# Patient Record
Sex: Female | Born: 1980 | Race: White | Hispanic: No | Marital: Single | Smoking: Former smoker
Health system: Southern US, Community
[De-identification: ages and names within clinical notes are randomized; demographics above are authoritative.]

## PROBLEM LIST (undated history)

## (undated) DIAGNOSIS — E119 Type 2 diabetes mellitus without complications: Secondary | ICD-10-CM

## (undated) DIAGNOSIS — F419 Anxiety disorder, unspecified: Secondary | ICD-10-CM

## (undated) HISTORY — PX: BACK SURGERY: SHX140

## (undated) HISTORY — PX: TOOTH EXTRACTION: SUR596

## (undated) HISTORY — PX: LAPAROSCOPY: SHX197

---
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
## 1999-05-19 ENCOUNTER — Other Ambulatory Visit: Admission: RE | Admit: 1999-05-19 | Discharge: 1999-05-19 | Payer: Self-pay | Admitting: Obstetrics and Gynecology

## 2000-09-06 ENCOUNTER — Other Ambulatory Visit: Admission: RE | Admit: 2000-09-06 | Discharge: 2000-09-06 | Payer: Self-pay | Admitting: Obstetrics and Gynecology

## 2001-09-04 ENCOUNTER — Other Ambulatory Visit: Admission: RE | Admit: 2001-09-04 | Discharge: 2001-09-04 | Payer: Self-pay | Admitting: Obstetrics and Gynecology

## 2002-01-30 ENCOUNTER — Encounter: Admission: RE | Admit: 2002-01-30 | Discharge: 2002-01-30 | Payer: Self-pay | Admitting: Family Medicine

## 2002-01-30 ENCOUNTER — Encounter: Payer: Self-pay | Admitting: Family Medicine

## 2002-02-13 ENCOUNTER — Other Ambulatory Visit: Admission: RE | Admit: 2002-02-13 | Discharge: 2002-02-13 | Payer: Self-pay | Admitting: Obstetrics and Gynecology

## 2002-03-22 ENCOUNTER — Ambulatory Visit (HOSPITAL_COMMUNITY): Admission: RE | Admit: 2002-03-22 | Discharge: 2002-03-22 | Payer: Self-pay | Admitting: Obstetrics and Gynecology

## 2004-01-15 ENCOUNTER — Ambulatory Visit (HOSPITAL_COMMUNITY): Admission: RE | Admit: 2004-01-15 | Discharge: 2004-01-15 | Payer: Self-pay | Admitting: Obstetrics and Gynecology

## 2004-01-15 ENCOUNTER — Other Ambulatory Visit: Admission: RE | Admit: 2004-01-15 | Discharge: 2004-01-15 | Payer: Self-pay | Admitting: Obstetrics and Gynecology

## 2005-06-16 ENCOUNTER — Encounter: Admission: RE | Admit: 2005-06-16 | Discharge: 2005-06-16 | Payer: Self-pay | Admitting: Family Medicine

## 2005-06-29 ENCOUNTER — Other Ambulatory Visit: Admission: RE | Admit: 2005-06-29 | Discharge: 2005-06-29 | Payer: Self-pay | Admitting: Family Medicine

## 2005-12-21 ENCOUNTER — Inpatient Hospital Stay (HOSPITAL_COMMUNITY): Admission: EM | Admit: 2005-12-21 | Discharge: 2005-12-24 | Payer: Self-pay | Admitting: Emergency Medicine

## 2006-03-26 ENCOUNTER — Emergency Department (HOSPITAL_COMMUNITY): Admission: EM | Admit: 2006-03-26 | Discharge: 2006-03-27 | Payer: Self-pay | Admitting: Emergency Medicine

## 2006-03-27 ENCOUNTER — Emergency Department (HOSPITAL_COMMUNITY): Admission: EM | Admit: 2006-03-27 | Discharge: 2006-03-28 | Payer: Self-pay | Admitting: Emergency Medicine

## 2007-02-03 ENCOUNTER — Ambulatory Visit: Payer: Self-pay | Admitting: Oncology

## 2007-02-03 ENCOUNTER — Inpatient Hospital Stay (HOSPITAL_COMMUNITY): Admission: AD | Admit: 2007-02-03 | Discharge: 2007-02-10 | Payer: Self-pay | Admitting: Family Medicine

## 2007-02-06 ENCOUNTER — Encounter (INDEPENDENT_AMBULATORY_CARE_PROVIDER_SITE_OTHER): Payer: Self-pay | Admitting: Internal Medicine

## 2007-02-07 ENCOUNTER — Encounter: Payer: Self-pay | Admitting: Hematology & Oncology

## 2007-02-10 ENCOUNTER — Ambulatory Visit: Payer: Self-pay | Admitting: Hematology & Oncology

## 2007-02-14 LAB — CBC & DIFF AND RETIC
BASO%: 0.2 % (ref 0.0–2.0)
EOS%: 0 % (ref 0.0–7.0)
HCT: 36.1 % (ref 34.8–46.6)
IRF: 0.38 — ABNORMAL HIGH (ref 0.130–0.330)
MCH: 31.3 pg (ref 26.0–34.0)
MCHC: 34.5 g/dL (ref 32.0–36.0)
NEUT%: 88.3 % — ABNORMAL HIGH (ref 39.6–76.8)
RDW: 15.4 % — ABNORMAL HIGH (ref 11.3–14.5)
lymph#: 0.8 10*3/uL — ABNORMAL LOW (ref 0.9–3.3)

## 2007-02-22 ENCOUNTER — Observation Stay (HOSPITAL_COMMUNITY): Admission: EM | Admit: 2007-02-22 | Discharge: 2007-02-23 | Payer: Self-pay | Admitting: Emergency Medicine

## 2007-02-27 LAB — CBC WITH DIFFERENTIAL/PLATELET
BASO%: 0.2 % (ref 0.0–2.0)
EOS%: 0 % (ref 0.0–7.0)
HCT: 36.2 % (ref 34.8–46.6)
LYMPH%: 23.1 % (ref 14.0–48.0)
MCH: 32.2 pg (ref 26.0–34.0)
MCHC: 35 g/dL (ref 32.0–36.0)
MONO%: 5 % (ref 0.0–13.0)
NEUT%: 71.7 % (ref 39.6–76.8)
Platelets: 236 10*3/uL (ref 145–400)
lymph#: 1.9 10*3/uL (ref 0.9–3.3)

## 2007-03-30 ENCOUNTER — Emergency Department (HOSPITAL_COMMUNITY): Admission: EM | Admit: 2007-03-30 | Discharge: 2007-03-31 | Payer: Self-pay | Admitting: Emergency Medicine

## 2007-06-08 ENCOUNTER — Encounter: Admission: RE | Admit: 2007-06-08 | Discharge: 2007-06-08 | Payer: Self-pay | Admitting: Family Medicine

## 2008-11-19 ENCOUNTER — Other Ambulatory Visit: Admission: RE | Admit: 2008-11-19 | Discharge: 2008-11-19 | Payer: Self-pay | Admitting: Family Medicine

## 2009-03-23 ENCOUNTER — Emergency Department (HOSPITAL_COMMUNITY): Admission: EM | Admit: 2009-03-23 | Discharge: 2009-03-23 | Payer: Self-pay | Admitting: Emergency Medicine

## 2010-01-28 ENCOUNTER — Other Ambulatory Visit
Admission: RE | Admit: 2010-01-28 | Discharge: 2010-01-28 | Payer: Self-pay | Source: Home / Self Care | Admitting: Obstetrics and Gynecology

## 2010-03-12 ENCOUNTER — Ambulatory Visit (HOSPITAL_COMMUNITY): Admission: RE | Admit: 2010-03-12 | Payer: Self-pay | Source: Home / Self Care | Admitting: Obstetrics and Gynecology

## 2010-03-15 ENCOUNTER — Encounter: Payer: Self-pay | Admitting: Obstetrics and Gynecology

## 2010-03-25 ENCOUNTER — Encounter: Payer: Self-pay | Admitting: Obstetrics and Gynecology

## 2010-03-25 ENCOUNTER — Other Ambulatory Visit (HOSPITAL_COMMUNITY): Payer: Self-pay | Admitting: Obstetrics and Gynecology

## 2010-04-09 ENCOUNTER — Ambulatory Visit (HOSPITAL_COMMUNITY)
Admission: RE | Admit: 2010-04-09 | Discharge: 2010-04-09 | Disposition: A | Discharge status: Home / Self Care | Payer: Medicaid Other | Source: Ambulatory Visit | Attending: Obstetrics and Gynecology | Admitting: Obstetrics and Gynecology

## 2010-04-09 DIAGNOSIS — Z1231 Encounter for screening mammogram for malignant neoplasm of breast: Secondary | ICD-10-CM | POA: Insufficient documentation

## 2010-05-28 ENCOUNTER — Ambulatory Visit (HOSPITAL_COMMUNITY)
Admission: RE | Admit: 2010-05-28 | Discharge: 2010-05-28 | Disposition: A | Discharge status: Home / Self Care | Payer: Medicaid Other | Source: Ambulatory Visit | Attending: Obstetrics and Gynecology | Admitting: Obstetrics and Gynecology

## 2010-05-28 DIAGNOSIS — O24919 Unspecified diabetes mellitus in pregnancy, unspecified trimester: Secondary | ICD-10-CM | POA: Insufficient documentation

## 2010-05-28 DIAGNOSIS — E119 Type 2 diabetes mellitus without complications: Secondary | ICD-10-CM | POA: Insufficient documentation

## 2010-06-10 ENCOUNTER — Other Ambulatory Visit: Payer: Self-pay | Admitting: Obstetrics and Gynecology

## 2010-06-10 ENCOUNTER — Inpatient Hospital Stay (HOSPITAL_COMMUNITY)
Admission: AD | Admit: 2010-06-10 | Discharge: 2010-06-12 | DRG: 774 | Disposition: A | Discharge status: Home / Self Care | Payer: Medicaid Other | Source: Ambulatory Visit | Attending: Obstetrics and Gynecology | Admitting: Obstetrics and Gynecology

## 2010-06-10 ENCOUNTER — Inpatient Hospital Stay (HOSPITAL_COMMUNITY): Payer: Medicaid Other

## 2010-06-10 DIAGNOSIS — E039 Hypothyroidism, unspecified: Secondary | ICD-10-CM | POA: Diagnosis present

## 2010-06-10 DIAGNOSIS — E119 Type 2 diabetes mellitus without complications: Secondary | ICD-10-CM | POA: Diagnosis present

## 2010-06-10 DIAGNOSIS — O99284 Endocrine, nutritional and metabolic diseases complicating childbirth: Secondary | ICD-10-CM | POA: Diagnosis present

## 2010-06-10 DIAGNOSIS — O321XX0 Maternal care for breech presentation, not applicable or unspecified: Secondary | ICD-10-CM | POA: Diagnosis present

## 2010-06-10 DIAGNOSIS — O4590 Premature separation of placenta, unspecified, unspecified trimester: Principal | ICD-10-CM | POA: Diagnosis present

## 2010-06-10 DIAGNOSIS — O2432 Unspecified pre-existing diabetes mellitus in childbirth: Secondary | ICD-10-CM | POA: Diagnosis present

## 2010-06-10 DIAGNOSIS — E079 Disorder of thyroid, unspecified: Secondary | ICD-10-CM | POA: Diagnosis present

## 2010-06-10 LAB — COMPREHENSIVE METABOLIC PANEL
ALT: 13 U/L (ref 0–35)
AST: 28 U/L (ref 0–37)
Albumin: 4 g/dL (ref 3.5–5.2)
Alkaline Phosphatase: 81 U/L (ref 39–117)
BUN: 3 mg/dL — ABNORMAL LOW (ref 6–23)
CO2: <5 mEq/L — CL (ref 19–32)
Calcium: 8.8 mg/dL (ref 8.4–10.5)
Chloride: 103 mEq/L (ref 96–112)
Creatinine, Ser: 1.11 mg/dL (ref 0.4–1.2)
GFR calc Af Amer: >60 mL/min (ref >60)
GFR calc non Af Amer: 58 mL/min — ABNORMAL LOW (ref >60)
Glucose, Bld: 303 mg/dL — ABNORMAL HIGH (ref 70–99)
Potassium: 4.5 mEq/L (ref 3.5–5.1)
Sodium: 134 mEq/L — ABNORMAL LOW (ref 135–145)
Total Bilirubin: 1.6 mg/dL — ABNORMAL HIGH (ref 0.3–1.2)
Total Protein: 8.4 g/dL — ABNORMAL HIGH (ref 6.0–8.3)

## 2010-06-10 LAB — CBC
Hemoglobin: 13.5 g/dL (ref 12.0–15.0)
RBC: 4.25 MIL/uL (ref 3.87–5.11)
WBC: 17.6 10*3/uL — ABNORMAL HIGH (ref 4.0–10.5)

## 2010-06-11 LAB — URINALYSIS, ROUTINE W REFLEX MICROSCOPIC
Glucose, UA: NEGATIVE mg/dL
Ketones, ur: >80 mg/dL — AB
Urobilinogen, UA: 1 mg/dL (ref 0.0–1.0)
pH: 6 (ref 5.0–8.0)

## 2010-06-11 LAB — DRUGS OF ABUSE SCREEN W/O ALC, ROUTINE URINE
Amphetamine Screen, Ur: NEGATIVE
Creatinine,U: 100.6 mg/dL
Marijuana Metabolite: NEGATIVE
Opiate Screen, Urine: NEGATIVE
Propoxyphene: NEGATIVE

## 2010-06-11 LAB — GLUCOSE, CAPILLARY
Glucose-Capillary: 509 mg/dL — ABNORMAL HIGH (ref 70–99)
Glucose-Capillary: 499 mg/dL — ABNORMAL HIGH (ref 70–99)
Glucose-Capillary: 275 mg/dL — ABNORMAL HIGH (ref 70–99)
Glucose-Capillary: 173 mg/dL — ABNORMAL HIGH (ref 70–99)
Glucose-Capillary: 120 mg/dL — ABNORMAL HIGH (ref 70–99)

## 2010-06-11 LAB — CBC
HCT: 33.8 % — ABNORMAL LOW (ref 36.0–46.0)
Platelets: 118 10*3/uL — ABNORMAL LOW (ref 150–400)
RBC: 3.7 MIL/uL — ABNORMAL LOW (ref 3.87–5.11)
RDW: 14.3 % (ref 11.5–15.5)
WBC: 10.6 10*3/uL — ABNORMAL HIGH (ref 4.0–10.5)

## 2010-06-11 LAB — URINE MICROSCOPIC-ADD ON

## 2010-06-11 LAB — MRSA PCR SCREENING: MRSA by PCR: NEGATIVE

## 2010-06-12 LAB — GLUCOSE, CAPILLARY
Glucose-Capillary: 205 mg/dL — ABNORMAL HIGH (ref 70–99)
Glucose-Capillary: 158 mg/dL — ABNORMAL HIGH (ref 70–99)
Glucose-Capillary: 108 mg/dL — ABNORMAL HIGH (ref 70–99)

## 2010-06-12 NOTE — Discharge Summary (Signed)
  [name], [name]               ACCOUNT NO.:  [id number]  MEDICAL RECORD NO.:  [id number]           PATIENT TYPE:  I  LOCATION:  9305                          FACILITY:  [facility]  PHYSICIAN:  Gerald Leitz, MD          DATE OF BIRTH:  22-Apr-1980  DATE OF ADMISSION:  06/10/2010 DATE OF DISCHARGE:  06/12/2010                              DISCHARGE SUMMARY   ADMISSION DIAGNOSES: 1. 27-week 0-day estimated gestational age. 2. Preterm labor. 3. Insulin-dependent diabetes. 4. Breech presentation. 5. Fetal bradycardia. 6. History of liver disease. 7. Back pain. 8. Hypothyroidism.  DISCHARGE DIAGNOSES: 1. 27-week 0-day estimated gestational age. 2. Preterm labor. 3. Insulin-dependent diabetes. 4. Breech presentation. 5. Fetal bradycardia. 6. History of liver disease. 7. Back pain. 8. Hypothyroidism. 9. Status post precipitous vaginal delivery.  BRIEF HOSPITAL COURSE:  The patient presented via EMS on June 10, 2010, at 27 weeks pregnancy with preterm labor, questionable abruption.  She was noted to have fetal bradycardia by ultrasound on arrival and delivered precipitously in Maternity Admissions Unit.  She delivered a female infant with Apgars of 1, 1, and 4.  The patient's blood sugar on admission was 509.  She was placed on glucose stabilizer for control of her blood sugars and is discharged home in stable and improved condition on NPH and NovoLog insulin as well as Synthroid, Motrin, and Ativan. She will follow up in the office actually in 2 weeks.  She will see her endocrinologist in 1 week, Dr. Talmage Coin.     Gerald Leitz, MD    TC/MEDQ  D:  06/12/2010  T:  06/12/2010  Job:  [id number]  Electronically Signed by Gerald Leitz MD on 06/12/2010 05:53:39 PM

## 2010-06-28 ENCOUNTER — Inpatient Hospital Stay (HOSPITAL_COMMUNITY)
Admission: EM | Admit: 2010-06-28 | Discharge: 2010-07-02 | DRG: 776 | Disposition: A | Discharge status: Home / Self Care | Payer: Medicaid Other | Attending: Internal Medicine | Admitting: Internal Medicine

## 2010-06-28 DIAGNOSIS — K219 Gastro-esophageal reflux disease without esophagitis: Secondary | ICD-10-CM | POA: Diagnosis present

## 2010-06-28 DIAGNOSIS — O99893 Other specified diseases and conditions complicating puerperium: Secondary | ICD-10-CM | POA: Diagnosis present

## 2010-06-28 DIAGNOSIS — J020 Streptococcal pharyngitis: Secondary | ICD-10-CM | POA: Diagnosis present

## 2010-06-28 DIAGNOSIS — Z794 Long term (current) use of insulin: Secondary | ICD-10-CM

## 2010-06-28 DIAGNOSIS — O9081 Anemia of the puerperium: Secondary | ICD-10-CM | POA: Diagnosis present

## 2010-06-28 DIAGNOSIS — D689 Coagulation defect, unspecified: Secondary | ICD-10-CM | POA: Diagnosis present

## 2010-06-28 DIAGNOSIS — IMO0002 Reserved for concepts with insufficient information to code with codable children: Secondary | ICD-10-CM

## 2010-06-28 DIAGNOSIS — E876 Hypokalemia: Secondary | ICD-10-CM | POA: Diagnosis present

## 2010-06-28 DIAGNOSIS — F341 Dysthymic disorder: Secondary | ICD-10-CM | POA: Diagnosis present

## 2010-06-28 DIAGNOSIS — D6959 Other secondary thrombocytopenia: Secondary | ICD-10-CM | POA: Diagnosis present

## 2010-06-28 DIAGNOSIS — D72819 Decreased white blood cell count, unspecified: Secondary | ICD-10-CM | POA: Diagnosis present

## 2010-06-28 DIAGNOSIS — D591 Autoimmune hemolytic anemia, unspecified: Secondary | ICD-10-CM | POA: Diagnosis present

## 2010-06-28 DIAGNOSIS — G8929 Other chronic pain: Secondary | ICD-10-CM | POA: Diagnosis present

## 2010-06-28 DIAGNOSIS — O2493 Unspecified diabetes mellitus in the puerperium: Principal | ICD-10-CM | POA: Diagnosis present

## 2010-06-28 DIAGNOSIS — D72829 Elevated white blood cell count, unspecified: Secondary | ICD-10-CM | POA: Diagnosis present

## 2010-06-28 DIAGNOSIS — E039 Hypothyroidism, unspecified: Secondary | ICD-10-CM | POA: Diagnosis present

## 2010-06-28 DIAGNOSIS — F17200 Nicotine dependence, unspecified, uncomplicated: Secondary | ICD-10-CM | POA: Diagnosis present

## 2010-06-28 DIAGNOSIS — E785 Hyperlipidemia, unspecified: Secondary | ICD-10-CM | POA: Diagnosis present

## 2010-06-28 DIAGNOSIS — E1010 Type 1 diabetes mellitus with ketoacidosis without coma: Secondary | ICD-10-CM | POA: Diagnosis present

## 2010-06-28 DIAGNOSIS — M549 Dorsalgia, unspecified: Secondary | ICD-10-CM | POA: Diagnosis present

## 2010-06-28 LAB — CBC
HCT: 45.6 % (ref 36.0–46.0)
Hemoglobin: 15.2 g/dL — ABNORMAL HIGH (ref 12.0–15.0)
MCHC: 33.3 g/dL (ref 30.0–36.0)
RBC: 4.8 MIL/uL (ref 3.87–5.11)

## 2010-06-28 LAB — DIFFERENTIAL
Eosinophils Relative: 0 % (ref 0–5)
Lymphocytes Relative: 23 % (ref 12–46)
Lymphs Abs: 2.9 10*3/uL (ref 0.7–4.0)
Monocytes Relative: 6 % (ref 3–12)
Neutro Abs: 8.8 10*3/uL — ABNORMAL HIGH (ref 1.7–7.7)

## 2010-06-29 LAB — URINALYSIS, MICROSCOPIC ONLY
Bilirubin Urine: NEGATIVE
Glucose, UA: >1000 mg/dL — AB
Ketones, ur: >80 mg/dL — AB
Leukocytes, UA: NEGATIVE
Protein, ur: 100 mg/dL — AB
pH: 5 (ref 5.0–8.0)

## 2010-06-29 LAB — CBC
HCT: 37.5 % (ref 36.0–46.0)
Platelets: 124 10*3/uL — ABNORMAL LOW (ref 150–400)
RBC: 4.13 MIL/uL (ref 3.87–5.11)
RDW: 14 % (ref 11.5–15.5)
WBC: 10.3 10*3/uL (ref 4.0–10.5)

## 2010-06-29 LAB — BASIC METABOLIC PANEL
BUN: 10 mg/dL (ref 6–23)
BUN: 8 mg/dL (ref 6–23)
BUN: 7 mg/dL (ref 6–23)
BUN: 6 mg/dL (ref 6–23)
CO2: <5 mEq/L — CL (ref 19–32)
CO2: 11 mEq/L — ABNORMAL LOW (ref 19–32)
CO2: 14 mEq/L — ABNORMAL LOW (ref 19–32)
Calcium: 9.3 mg/dL (ref 8.4–10.5)
Calcium: 8.2 mg/dL — ABNORMAL LOW (ref 8.4–10.5)
Calcium: 8.7 mg/dL (ref 8.4–10.5)
Calcium: 8.4 mg/dL (ref 8.4–10.5)
Chloride: 80 mEq/L — ABNORMAL LOW (ref 96–112)
Chloride: 94 mEq/L — ABNORMAL LOW (ref 96–112)
Chloride: 105 mEq/L (ref 96–112)
Chloride: 105 mEq/L (ref 96–112)
Chloride: 107 mEq/L (ref 96–112)
Creatinine, Ser: 0.89 mg/dL (ref 0.4–1.2)
Creatinine, Ser: 0.87 mg/dL (ref 0.4–1.2)
Creatinine, Ser: 0.64 mg/dL (ref 0.4–1.2)
GFR calc Af Amer: >60 mL/min (ref >60)
GFR calc Af Amer: >60 mL/min (ref >60)
GFR calc Af Amer: >60 mL/min (ref >60)
GFR calc non Af Amer: >60 mL/min (ref >60)
GFR calc non Af Amer: >60 mL/min (ref >60)
GFR calc non Af Amer: >60 mL/min (ref >60)
Glucose, Bld: 520 mg/dL — ABNORMAL HIGH (ref 70–99)
Glucose, Bld: 153 mg/dL — ABNORMAL HIGH (ref 70–99)
Glucose, Bld: 156 mg/dL — ABNORMAL HIGH (ref 70–99)
Glucose, Bld: 153 mg/dL — ABNORMAL HIGH (ref 70–99)
Glucose, Bld: 88 mg/dL (ref 70–99)
Potassium: 3.6 mEq/L (ref 3.5–5.1)
Potassium: 3.5 mEq/L (ref 3.5–5.1)
Sodium: 125 mEq/L — ABNORMAL LOW (ref 135–145)
Sodium: 135 mEq/L (ref 135–145)
Sodium: 134 mEq/L — ABNORMAL LOW (ref 135–145)

## 2010-06-29 LAB — GLUCOSE, CAPILLARY
Glucose-Capillary: 372 mg/dL — ABNORMAL HIGH (ref 70–99)
Glucose-Capillary: 283 mg/dL — ABNORMAL HIGH (ref 70–99)
Glucose-Capillary: 192 mg/dL — ABNORMAL HIGH (ref 70–99)
Glucose-Capillary: 151 mg/dL — ABNORMAL HIGH (ref 70–99)
Glucose-Capillary: 199 mg/dL — ABNORMAL HIGH (ref 70–99)
Glucose-Capillary: 179 mg/dL — ABNORMAL HIGH (ref 70–99)
Glucose-Capillary: 162 mg/dL — ABNORMAL HIGH (ref 70–99)
Glucose-Capillary: 138 mg/dL — ABNORMAL HIGH (ref 70–99)
Glucose-Capillary: 160 mg/dL — ABNORMAL HIGH (ref 70–99)
Glucose-Capillary: 184 mg/dL — ABNORMAL HIGH (ref 70–99)

## 2010-06-29 LAB — BLOOD GAS, ARTERIAL
Acid-base deficit: 20.5 mmol/L — ABNORMAL HIGH (ref 0.0–2.0)
Bicarbonate: 1.3 mEq/L — ABNORMAL LOW (ref 20.0–24.0)
Drawn by: 295031
TCO2: 5.5 mmol/L (ref 0–100)
TCO2: 1.4 mmol/L (ref 0–100)
pCO2 arterial: 14.6 mmHg — CL (ref 35.0–45.0)
pCO2 arterial: 7 mmHg — CL (ref 35.0–45.0)
pH, Arterial: 7.228 — ABNORMAL LOW (ref 7.350–7.400)
pH, Arterial: 6.904 — CL (ref 7.350–7.400)
pO2, Arterial: 107 mmHg — ABNORMAL HIGH (ref 80.0–100.0)
pO2, Arterial: 155 mmHg — ABNORMAL HIGH (ref 80.0–100.0)

## 2010-06-29 LAB — PHOSPHORUS: Phosphorus: 3.7 mg/dL (ref 2.3–4.6)

## 2010-06-29 LAB — LACTIC ACID, PLASMA: Lactic Acid, Venous: 1 mmol/L (ref 0.5–2.2)

## 2010-06-29 LAB — T4, FREE: Free T4: 0.59 ng/dL — ABNORMAL LOW (ref 0.80–1.80)

## 2010-06-29 LAB — T3: T3, Total: 35 ng/dl — ABNORMAL LOW (ref 80.0–204.0)

## 2010-06-29 LAB — MAGNESIUM
Magnesium: 2.2 mg/dL (ref 1.5–2.5)
Magnesium: 1.9 mg/dL (ref 1.5–2.5)

## 2010-06-30 LAB — GLUCOSE, CAPILLARY
Glucose-Capillary: 82 mg/dL (ref 70–99)
Glucose-Capillary: 108 mg/dL — ABNORMAL HIGH (ref 70–99)
Glucose-Capillary: 208 mg/dL — ABNORMAL HIGH (ref 70–99)
Glucose-Capillary: 85 mg/dL (ref 70–99)
Glucose-Capillary: 120 mg/dL — ABNORMAL HIGH (ref 70–99)
Glucose-Capillary: 310 mg/dL — ABNORMAL HIGH (ref 70–99)

## 2010-06-30 LAB — COMPREHENSIVE METABOLIC PANEL
Alkaline Phosphatase: 70 U/L (ref 39–117)
BUN: 4 mg/dL — ABNORMAL LOW (ref 6–23)
Chloride: 104 mEq/L (ref 96–112)
Creatinine, Ser: 0.51 mg/dL (ref 0.4–1.2)
GFR calc non Af Amer: >60 mL/min (ref >60)
Glucose, Bld: 214 mg/dL — ABNORMAL HIGH (ref 70–99)
Potassium: 3.1 mEq/L — ABNORMAL LOW (ref 3.5–5.1)
Total Bilirubin: 0.2 mg/dL — ABNORMAL LOW (ref 0.3–1.2)

## 2010-06-30 LAB — BASIC METABOLIC PANEL
BUN: 5 mg/dL — ABNORMAL LOW (ref 6–23)
BUN: 3 mg/dL — ABNORMAL LOW (ref 6–23)
BUN: <3 mg/dL — ABNORMAL LOW (ref 6–23)
CO2: 16 mEq/L — ABNORMAL LOW (ref 19–32)
CO2: 16 mEq/L — ABNORMAL LOW (ref 19–32)
CO2: 18 mEq/L — ABNORMAL LOW (ref 19–32)
CO2: 16 mEq/L — ABNORMAL LOW (ref 19–32)
Calcium: 8.3 mg/dL — ABNORMAL LOW (ref 8.4–10.5)
Calcium: 7.3 mg/dL — ABNORMAL LOW (ref 8.4–10.5)
Calcium: 7.5 mg/dL — ABNORMAL LOW (ref 8.4–10.5)
Chloride: 106 mEq/L (ref 96–112)
Chloride: 103 mEq/L (ref 96–112)
Creatinine, Ser: <0.47 mg/dL (ref 0.4–1.2)
Creatinine, Ser: <0.47 mg/dL (ref 0.4–1.2)
GFR calc Af Amer: >60 mL/min (ref >60)
Glucose, Bld: 171 mg/dL — ABNORMAL HIGH (ref 70–99)
Potassium: 3.4 mEq/L — ABNORMAL LOW (ref 3.5–5.1)
Potassium: 3.6 mEq/L (ref 3.5–5.1)
Sodium: 132 mEq/L — ABNORMAL LOW (ref 135–145)
Sodium: 131 mEq/L — ABNORMAL LOW (ref 135–145)

## 2010-06-30 LAB — FERRITIN: Ferritin: 284 ng/mL (ref 10–291)

## 2010-06-30 LAB — MAGNESIUM
Magnesium: 1.8 mg/dL (ref 1.5–2.5)
Magnesium: 1.7 mg/dL (ref 1.5–2.5)

## 2010-06-30 LAB — FOLATE: Folate: 13.5 ng/mL

## 2010-06-30 LAB — CBC
HCT: 30.5 % — ABNORMAL LOW (ref 36.0–46.0)
MCH: 30.8 pg (ref 26.0–34.0)
MCV: 88.7 fL (ref 78.0–100.0)
RBC: 3.44 MIL/uL — ABNORMAL LOW (ref 3.87–5.11)
RDW: 15.1 % (ref 11.5–15.5)
WBC: 4.4 10*3/uL (ref 4.0–10.5)

## 2010-06-30 LAB — IRON AND TIBC: Iron: 58 ug/dL (ref 42–135)

## 2010-06-30 LAB — PHOSPHORUS: Phosphorus: 1 mg/dL — CL (ref 2.3–4.6)

## 2010-07-01 LAB — GLUCOSE, CAPILLARY
Glucose-Capillary: 227 mg/dL — ABNORMAL HIGH (ref 70–99)
Glucose-Capillary: 137 mg/dL — ABNORMAL HIGH (ref 70–99)
Glucose-Capillary: 333 mg/dL — ABNORMAL HIGH (ref 70–99)

## 2010-07-01 LAB — COMPREHENSIVE METABOLIC PANEL
Albumin: 2.9 g/dL — ABNORMAL LOW (ref 3.5–5.2)
Alkaline Phosphatase: 72 U/L (ref 39–117)
BUN: <3 mg/dL — ABNORMAL LOW (ref 6–23)
Chloride: 105 mEq/L (ref 96–112)
Creatinine, Ser: <0.47 mg/dL (ref 0.4–1.2)
Glucose, Bld: 167 mg/dL — ABNORMAL HIGH (ref 70–99)
Potassium: 3.3 mEq/L — ABNORMAL LOW (ref 3.5–5.1)
Total Bilirubin: 0.2 mg/dL — ABNORMAL LOW (ref 0.3–1.2)

## 2010-07-01 LAB — BASIC METABOLIC PANEL
BUN: <3 mg/dL — ABNORMAL LOW (ref 6–23)
BUN: <3 mg/dL — ABNORMAL LOW (ref 6–23)
CO2: 17 mEq/L — ABNORMAL LOW (ref 19–32)
CO2: 20 mEq/L (ref 19–32)
Calcium: 7.9 mg/dL — ABNORMAL LOW (ref 8.4–10.5)
Chloride: 104 mEq/L (ref 96–112)
Chloride: 105 mEq/L (ref 96–112)
Creatinine, Ser: <0.47 mg/dL (ref 0.4–1.2)
Creatinine, Ser: <0.47 mg/dL (ref 0.4–1.2)
Potassium: 3.5 mEq/L (ref 3.5–5.1)
Potassium: 3.5 mEq/L (ref 3.5–5.1)
Sodium: 135 mEq/L (ref 135–145)
Sodium: 134 mEq/L — ABNORMAL LOW (ref 135–145)

## 2010-07-01 LAB — CBC
HCT: 32.3 % — ABNORMAL LOW (ref 36.0–46.0)
MCH: 30.9 pg (ref 26.0–34.0)
MCV: 89.2 fL (ref 78.0–100.0)
Platelets: 61 10*3/uL — ABNORMAL LOW (ref 150–400)
RBC: 3.62 MIL/uL — ABNORMAL LOW (ref 3.87–5.11)
WBC: 2.6 10*3/uL — ABNORMAL LOW (ref 4.0–10.5)

## 2010-07-01 LAB — PHOSPHORUS: Phosphorus: 1.5 mg/dL — ABNORMAL LOW (ref 2.3–4.6)

## 2010-07-01 LAB — MAGNESIUM: Magnesium: 1.9 mg/dL (ref 1.5–2.5)

## 2010-07-02 LAB — BASIC METABOLIC PANEL
CO2: 27 mEq/L (ref 19–32)
Chloride: 97 mEq/L (ref 96–112)
Glucose, Bld: 250 mg/dL — ABNORMAL HIGH (ref 70–99)
Sodium: 133 mEq/L — ABNORMAL LOW (ref 135–145)

## 2010-07-02 LAB — GLUCOSE, CAPILLARY
Glucose-Capillary: 281 mg/dL — ABNORMAL HIGH (ref 70–99)
Glucose-Capillary: 127 mg/dL — ABNORMAL HIGH (ref 70–99)

## 2010-07-02 LAB — CBC
HCT: 35.5 % — ABNORMAL LOW (ref 36.0–46.0)
Hemoglobin: 12.4 g/dL (ref 12.0–15.0)
MCH: 31.3 pg (ref 26.0–34.0)
RBC: 3.96 MIL/uL (ref 3.87–5.11)

## 2010-07-02 LAB — PHOSPHORUS: Phosphorus: 2.7 mg/dL (ref 2.3–4.6)

## 2010-07-07 NOTE — Discharge Summary (Signed)
NAME:  [name], [name]            ACCOUNT NO.:  [id number]   MEDICAL RECORD NO.:  [id number]          PATIENT TYPE:  OBV   LOCATION:  5504                         FACILITY:  [facility]   PHYSICIAN:  Corinna L. Lendell Caprice, MDDATE OF BIRTH:  [birth year]   DATE OF ADMISSION:  02/22/2007  DATE OF DISCHARGE:  02/23/2007                               DISCHARGE SUMMARY   DISCHARGE DIAGNOSES:  1. Steroid-induced diabetes.  2. Autoimmune hemolytic anemia.   DISCHARGE MEDICATIONS:  1. Amaryl 4 mg a day.  2. She may continue her outpatient medications which include folic      acid 1 mg a day.  3. Pepcid 20 mg p.o. b.i.d.  4. Prednisone 20 mg p.o. b.i.d.  5. Multivitamin a day.  6. Vicodin as needed.   CONDITION:  Stable.   CONSULTATIONS:  None.   PROCEDURES:  None.   FOLLOWUP:  Follow up with Dr. Clovis Riley next week with blood sugar values  and any adjustments made to her Amaryl as needed.   DIET:  Should be diabetic.   LABORATORY DATA:  Hemoglobin A1c is pending.  Urine pregnancy negative.  Urinalysis:  Specific gravity 1.025, urine glucose greater than 1000,  negative ketones, negative nitrite, negative leukocyte esterase.  Normal  magnesium.  Sodium 130, potassium 3.9, chloride 98, bicarbonate 22,  glucose 516, BUN 11, creatinine 0.49.  White blood cell count 9.2,  hemoglobin 11.6, hematocrit 33.8, MCV 92, platelet count 252.   HISTORY AND HOSPITAL COURSE:  [name] is a pleasant 30 year old  white female who was recently discharged from the hospital on prednisone  for an autoimmune anemia.  She had a hospital followup with Dr. Clovis Riley  at which time labs were drawn and she was noted to have a blood sugar of  800.  She was instructed to come to the emergency room.  Patient has no  previous history of diabetes.  She had joint pains, but otherwise was  not symptomatic.  Heart rate was 159 initially, down to 104 when  evaluated by Dr. Rito Ehrlich.  She was afebrile, had normal blood  pressure,  respiratory rate.  She had moon faces, otherwise unremarkable exam.  She  was started on IV fluids, IV insulin.  She was not in DKA.  She was  given diabetic teaching and started on Amaryl.  Her blood glucose  was easily controlled and at the time of discharge ranged in the 100-200  range.  I have asked the nurse to give diabetic teaching and instruct  patient on Glucometer use.  I have asked patient to monitor her blood  sugars q.a.c. and nightly until she follows up with Dr. Clovis Riley.      Corinna L. Lendell Caprice, MD  Electronically Signed     CLS/MEDQ  D:  02/23/2007  T:  02/23/2007  Job:  [id number]   cc:   L. Lupe Carney, M.D.

## 2010-07-07 NOTE — H&P (Signed)
NAME:  [name], [name]            ACCOUNT NO.:  [id number]   MEDICAL RECORD NO.:  [id number]          PATIENT TYPE:  OBV   LOCATION:  5504                         FACILITY:  [facility]   PHYSICIAN:  Hollice Espy, M.D.DATE OF BIRTH:  Sep 12, 1980   DATE OF ADMISSION:  02/22/2007  DATE OF DISCHARGE:                              HISTORY & PHYSICAL   PRIMARY CARE PHYSICIAN:  L. Lupe Carney, M.D.   HEMATOLOGIST:  Rose Phi. Ennever, M.D.   CHIEF COMPLAINT:  Elevated blood sugar.   HISTORY OF PRESENT ILLNESS:  Patient is a 30 year old white female who  was just discharged from the [facility] service on February 10, 2007.  She was admitted for severe anemia felt to possibly be an  autoimmune hemolytic anemia.  At that time she was discharged on  prednisone, which she has followed up with Dr. Myna [name] from hematology,  who has continued her on a prednisone taper.  She has been on 30 mg  b.i.d. and has just recently decreased down to 20 mg b.i.d. one day  prior.  When she had follow-up routine blood work today, she was noted  to have an elevated blood sugar too high to calculate.  She was sent  over to the emergency room.  There, she was found to have a blood sugar  around 800.  Please note that the patient has no previous history of  diabetes mellitus, and she has underwent two pregnancies and had no  complications with gestational diabetes.  In review of her previous lab  work, her highest blood sugar while during her hospitalization was noted  to be 149; however, this was several days prior to discharge, and it is  unclear what time frame this was in relation to her steroids.   The patient herself is feeling okay.  She denies any headaches, vision  changes, dysphagia, chest pain, palpitations, shortness of breath,  wheeze, cough, abdominal pain, hematuria, dysuria, constipation,  diarrhea, focal extremity numbness, weakness, or pain other than her  only complaint being some  facial swelling secondary to her steroids and  some occasional right knee joint pain.  Her review of systems is  otherwise negative.   PAST MEDICAL HISTORY:  1. Recent diagnosis of autoimmune hemolytic anemia.  2. Recent E. coli UTI.  3. Hypocalcemia.  4. Hair loss of unclear etiology.  5. Elevated sed rate with a workup for connective tissue disorder      negative.   MEDICATIONS:  Based on her last discharge summary, she is on prednisone,  now down to 20 mg p.o. b.i.d., Pepcid 20 p.o. b.i.d., folic acid 1 mg,  multivitamin daily, and Selsun shampoo plus p.r.n. Tylenol.   ALLERGIES:  MORPHINE.   SOCIAL HISTORY:  She denies any tobacco, heavy alcohol, or drug use.   FAMILY HISTORY:  Noncontributory.   PHYSICAL EXAMINATION:  VITALS ON ADMISSION:  Temp 98.1, heart rate 159,  down to 104, blood pressure 120/70.  Respirations 20.  O2 sat 100% on  room air.  GENERAL:  She is alert and oriented x3 in no apparent distress.  HEENT:  Normocephalic and  atraumatic.  Her face is slightly swollen  secondary to steroids.  HEART:  Regular rate and rhythm.  S1 and S2.  LUNGS:  Clear to auscultation bilaterally.  ABDOMEN:  Soft, nontender, nondistended.  Positive bowel sounds.  EXTREMITIES:  No clubbing or cyanosis.  Trace pitting edema.   LAB WORK:  Sodium 130, potassium 3.9, chloride 98, bicarb 22, BUN 11,  creatinine 0.49, glucose 516.  Previous blood sugar by CBG was 8/800.  White count 9.2, H&H 11.6 and 34, MCV 92, platelet count 252, 82% shift.  UA notes a greater than 1000 glucose.  Magnesium level is normal.   ASSESSMENT/PLAN:  1. Hyperglycemia:  Acutely this is likely from her steroids, but I      question if she has an underlying diabetes mellitus condition.      Will start by checking a hemoglobin A1C.  If it does show      borderline elevated blood sugars, then her sugars should improve      once she is completely off prednisone.  In the meantime, we will go      with insulin  Glucomander.  If long term she has diabetes mellitus,      she perhaps could be diet-controlled.  If her hemoglobin A1C is      normal, then would recommend probably putting her on Metformin to      keep her sugars down while she is on prednisone, and then once she      is off prednisone, perhaps checking a fasting glucose in the next 3-      4 months post prednisone.  2. Hemolytic anemia:  Continue prednisone.      Hollice Espy, M.D.  Electronically Signed     SKK/MEDQ  D:  02/22/2007  T:  02/23/2007  Job:  [id number]   cc:   L. Lupe Carney, M.D.  Rose Phi. [name], M.D.

## 2010-07-07 NOTE — Discharge Summary (Signed)
[name], [name]               ACCOUNT NO.:  [id number]   MEDICAL RECORD NO.:  [id number]          PATIENT TYPE:  INP   LOCATION:  5009                         FACILITY:  [facility]   PHYSICIAN:  Kela Millin, M.D.DATE OF BIRTH:  1980/06/15   DATE OF ADMISSION:  02/03/2007  DATE OF DISCHARGE:  02/10/2007                               DISCHARGE SUMMARY   DISCHARGE DIAGNOSES:  1. Severe anemia, normochromic, normocytic.  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  2. Escherichia coli urinary tract infection.  3. Severe hypokalemia, resolved, last potassium prior to discharge      3.6.  4. Hypocalcemia, resolved.  Her last calcium level 8.4.  5. Hair loss, unclear etiology.  Follow up with dermatology      outpatient.  6. Elevated sedimentation rate.  Workup for connective tissue disease      negative and 2D echocardiogram with no vegetations reported and      normal ejection fraction.   CONSULTATIONS:  Hematology, Rose Phi. Myna Hidalgo, M.D.   PROCEDURES/STUDIES:  1. Bone marrow biopsy done on February 07, 2007, revealed a slightly      hypercellular for age but with trilineage hematopoiesis.  Erythroid      and megakaryocytic hyperplasia with lack of significant dyspoiesis      megaloblastic changes or nuclear inclusions.  No granulomata      identified.  Overall appearance nonspecific and hence clinical      correlation recommended per pathologist.  2. A 2D echocardiogram:  Overall left ventricular systolic function      normal, ejection fraction 60%, normal left ventricular regional      wall motion abnormalities, diastolic function parameters normal.  3. CT scan of the abdomen on February 04, 2007:  Hepatomegaly with      diffuse fatty infiltration.  Heterogeneity of the liver parenchyma      may be due to areas of focal fat deposition or inflammation      nonspecific pericholecystic fluid.  Pelvic CT negative.  4. Abdominal ultrasound:   Contracted gallbladder which results in      gallbladder wall thickening.  No additional secondary signs of      acute cholecystitis noted.  Fatty infiltration of the liver noted.  5. CT scan of head on February 06, 2007:  No acute intracranial      abnormality.  6. CT scan of chest with contrast on February 04, 2007:  Small left      pleural effusion with minimal left lower lobe atelectasis otherwise      normal CT scan of the chest.   BRIEF HISTORY:  The patient is a 30 year old white female with a history  of anxiety who presented with complaints of paresthesias to her primary  care physician's office.  Her lab work was checked and she found to have  a low potassium and her albumin was also low at 2.8 with a hemoglobin of  6.8.  Her blood pressure was low at 94/50, and she was tachycardic up to  140.  She was directly  admitted to the hospital for further evaluation  and management.  Dr. Adela Glimpse saw the patient in the hospital and stated  that the patient reported that for the 3 months prior to admission, she  had noted that her periods were a little heavier (more bleeding) which  she attributed to getting the Depo shot.  She stated that she was using  about 5 pads per day but that in the past month she had just a little  bit of spotting that had stopped a week prior to her admission.  She  denied melena, hematochezia.  The patient also denied fevers, chills,  weight loss.  She admitted to some nausea and vomiting for which she has  been taking Phenergan.  She also had noted that her urine was very dark  and that her face was slightly more swollen.  She denied shortness of  breath and she also denied lightheadedness.  She admitted to  paresthesias as mentioned above and feeling weak.  Please see the full  admission history and physical dictated on February 03, 2007 by Dr.  Adela Glimpse for the details of the admission physical exam as well as the  laboratory data.   HOSPITAL COURSE:  1.  Severe anemia, normochromic/normocytic.  The patient's hemoglobin      upon admission was 4.6 and she was transfused packed red blood      cells.  Hematology was consulted and the patient has had an      extensive workup during this hospital stay.  This included a PT of      13 with an INR of 1.  Her reticulocyte count 4.2.  Multiple myeloma      panel was done, total protein 5.4, IgA serum 323, IgG serum normal      at 1,170, IgM serum normal at 193, albumin serum 51.5, alpha-1      serum 6.7, alpha-2 serum 11.1, beta serum 4.9, beta-2 6.4,      gammaglobulin 19.4, and M-spike not detected.  Methylmalonic acid      level was done and it was within normal limits at 116 and her B12      level at the lower end of normal at 267.  A TSH was done which was      normal at 1 and her T3 also normal at 3.  The patient had a bone      marrow biopsy done and the result are stated above.  An ANA was      also done which was negative and ANCA was done which was negative -      less than 1:20.  Dr. Myna Hidalgo followed the patient while she was in      the hospital and she was initially placed on Solu-Medrol and this      was changed to prednisone and she will be discharged on prednisone      and is to follow up with Dr. Myna Hidalgo on February 13, 2007.  She has      not had any evidence of GI bleeding in the hospital.  Dr. Gustavo Lah      impression when he saw the patient initially was that the etiology      was unclear and that nutritional issue was certainly a possibility      as the patient's vitamin K levels were low as well as low magnesium      levels.  Following the bone marrow biopsy result, Dr. Myna Hidalgo  indicated at that point that it could be the less than 5% of      patients with Coombs negative immune hemolysis.  I noted that the      patient had an E. coli UTI which could cause nonimmune hemolysis.      The patient's copper level was checked which was the low normal      range and her G6PD  level was also checked which was within normal      limits.  Following her transfusions in the hospital, she has      remained hemodynamically stable.  Her last hemoglobin prior to      discharge today is 8.6 with a hematocrit of 24.6.  She has not had      any gross bleeding/GI bleeding and she is discharged on prednisone      and is to follow up with Dr. Myna Hidalgo as mentioned already on      December 22.  2. Hepatomegaly.  The patient had CT scans done in the hospital as      well as ultrasound and the results are as above, consistent with      fatty infiltration.  A hepatitis panel was also done and then      hepatitis B surface antibody was positive qualitatively but the      hepatitis B surface antigen was negative.  The patient is to follow      up with her primary care physician Dr. Clovis Riley for further      evaluation and outpatient GI referral when appropriate.  3. E. coli urinary tract infection.  The patient was febrile in the      hospital and a urinalysis was done which was consistent with a UTI.      Urine cultures were done which grew E. coli that was pansensitive.      The patient was treated with Zosyn and has been afebrile and      asymptomatic.  She will be discharged on oral Augmentin to complete      the antibiotic course and is to follow up with her primary care      physician.  4. Hair loss.  The patient is to follow up with dermatology      outpatient.  She was treated with Selsun shampoo in the hospital      for possible seborrheic dermatitis and she reported some      improvement.  As mentioned she is to follow up with outpatient      dermatology.  5. Severe hypokalemia.  The patient's potassium was less than 2 upon      admission and her magnesium was checked and this was also low.  She      received supplemental potassium as well as magnesium during her      hospital stay.  Her last potassium prior to discharge is 3.6.  She      is to follow up with Dr.  Clovis Riley and to have a B-MET done on      followup and if her potassium is still low at that point she will      need further evaluation and maintenance potassium supplement as      appropriate.  6. Hypocalcemia.  It was noted that the patient's albumin was also low      at 2.6, a corrected calcium was calculated and this was also found      to be low at 7.6.  The patient  received supplemental potassium.      Her last potassium prior to discharge was 8.4.  7. Elevated sed rate.  Her sedimentation rate was elevated at 107.      Workup for connective tissue disease as already mentioned above      included ANA which was negative, ANCA which was also negative.  A      2D echocardiogram was done and was negative as well.  The patient      was treated for the urinary tract infection and her blood cultures      have been negative.   DISCHARGE MEDICATIONS:  1. Prednisone 20 mg three tablets daily with food.  2. Pepcid 20 mg p.o. b.i.d.  3. Folic acid 1 mg p.o. daily.  4. Multivitamin with iron daily.  5. Augmentin 500 mg p.o. b.i.d. x5 more days.  6. Continue Selsun shampoo.  7. Tylenol p.r.n.   FOLLOWUP CARE:  1. Dr. Myna Hidalgo on Monday, December 22nd as scheduled.  2. Dr. Clovis Riley next week.  The patient to call for an appointment and      is to have a potassium/B-MET checked.  3. Outpatient dermatologist.  The patient is to call for an      appointment.   DISCHARGE CONDITION:  Improved, stable.      Kela Millin, M.D.  Electronically Signed     ACV/MEDQ  D:  02/10/2007  T:  02/10/2007  Job:  [id number]   cc:   L. Lupe Carney, M.D.  Rose Phi. Myna Hidalgo, M.D.

## 2010-07-07 NOTE — Op Note (Signed)
NAMEBRYAN, OMURA               ACCOUNT NO.:  [id number]   MEDICAL RECORD NO.:  [id number]          PATIENT TYPE:  INP   LOCATION:  2919                         FACILITY:  [facility]   PHYSICIAN:  Rose Phi. Myna Hidalgo, M.D. DATE OF BIRTH:  February 11, 1981   DATE OF PROCEDURE:  02/07/2007  DATE OF DISCHARGE:                               OPERATIVE REPORT   PROCEDURE PERFORMED:  Left posterior iliac crest bone marrow biopsy and  aspirate.   Ms. Minshall was in her room on 2919.  We went ahead and did a bone  marrow biopsy to try to sort out her anemia.  I wanted to rule out red  cell aplasia.   She was placed onto her right side.  She had a total of 10 mg Versed for  sedation.  The left posterior iliac crest region was prepped and draped  in a sterile fashion.  We went ahead and infiltrated 2% lidocaine onto  the skin down to the periosteum.  A #1 scalpel was used to make an  incision into the skin.  Two bone marrow aspirates were obtained without  difficulty.  A second incision was made into the skin.  A good bone  marrow biopsy core was obtained without difficulty.  Ms. Moyd  tolerated the procedure well.  She absolutely had no problems pain wise.      Rose Phi. Myna Hidalgo, M.D.  Electronically Signed     PRE/MEDQ  D:  02/07/2007  T:  02/07/2007  Job:  [id number]

## 2010-07-07 NOTE — Consult Note (Signed)
[name], [name]               ACCOUNT NO.:  [id number]   MEDICAL RECORD NO.:  [id number]          PATIENT TYPE:  INP   LOCATION:  2919                         FACILITY:  [facility]   PHYSICIAN:  Rose Phi. Myna Hidalgo, M.D. DATE OF BIRTH:  [birth year]   DATE OF CONSULTATION:  02/03/2007  DATE OF DISCHARGE:                                 CONSULTATION   REFERRING PHYSICIAN:  Michiel Cowboy, M.D.   REASON FOR CONSULTATION:  Marked normochromic, normocytic anemia.   HISTORY OF PRESENT ILLNESS:  [name] is a very charming 30 year old  white female who has been in good health.  She apparently had a surgical  procedure on the spine out in [place] a few years ago for  hyperhidrosis.  She is followed by Dr. Lupe Carney.   She has been having some paresthesias.  She was noted to be anemic.  She  had a hemoglobin of 6.8.  She had decreased blood pressure.   She subsequently was admitted directly to the hospital.   She denies any type of new medications.  There has been no kind of  foreign travel.  She states that she has had some heavy cycles.  She  does chew ice.   She has had no fevers, sweats, or chills.  She has been noticing some  hair loss.  She also noticed a rash on her scalp.   We were subsequently asked to see her.  She had extensive workup today.  She had a direct Coombs' that was negative.  She had markedly depressed  potassium on admission.  Potassium less than 2.  Her magnesium was less  than 0.8.  She had a decreased protein and albumin.  Her sed rate was  quite elevated at 140.   She denied any kind of aches or pains.   PAST MEDICAL HISTORY:  1. Anxiety.  2. Hyperhidrosis.   ALLERGIES:  MORPHINE.   CURRENT MEDICATIONS:  1. Xanax 0.5 mg as needed.  2. Phenergan 12.5 mg q.8h. as needed.  3. Depo-Provera shot in the past.   SOCIAL HISTORY:  Negative for tobacco or alcohol use.  She denies any  kind of IV drug use.  There is no obvious risk factors for  HIV or  hepatitis.  She does have some tattoos.   FAMILY HISTORY:  Negative for any obvious hematologic issue.   REVIEW OF SYSTEMS:  As stated in the history of present illness.   PHYSICAL EXAMINATION:  GENERAL:  This is a fairly well-developed, well-  nourished white female in no obvious distress.  She is alert and  oriented x3.  VITAL SIGNS:  Temperature 98, pulse 116, respiratory rate 24, blood  pressure 100/70.  HEENT:  Normocephalic, atraumatic skull.  There was no scleral icterus.  She had no oral lesions.  There was no mucositis.  There was no angular  cheilitis.  She did have these maculopapular-type lesions on her scalp.  NECK:  Thyroid was not palpable.  No adenopathy.  LUNGS:  Clear bilaterally.  CARDIAC:  Tachycardic but regular.  There were no murmurs, rubs, or  bruits.  ABDOMEN:  Soft with good bowel sounds.  There was no palpable abdominal  mass.  There was no fluid wave.  There was no palpable  hepatosplenomegaly.  BACK:  No tenderness of the spine, ribs, or hips.  EXTREMITIES:  No clubbing, cyanosis, or edema.  She has good range of  motion of her joints.  There is no joint swelling or redness.  SKIN:  No rashes, ecchymosis, or petechia.  Again, she had several  tattoos.  She also has some piercings.  NEUROLOGIC:  No focal neurological deficits.   LABORATORY DATA:  On admission, white cell count 6.3, hemoglobin 4.6,  hematocrit 12.6, platelet count 192.  MCV was 90.  She had a total  bilirubin of 1.6.  LDH was 293.  Her BUN and creatinine were less than 1  and 0.6.  LFTs were normal.  Total protein 5.9 with albumin of 2.6.   Her DIC panel was negative.  Fibrinogen 305.  D-dimer less than 0.22.  Urine pregnancy test was negative.  Sed rate was repeated and was 107.  TSH was 3.8.  She had a drug screen which was positive for the  benzodiazepine which is to be expected.   Peripheral smear shows a somewhat anisocytotic blood smear.  She had a  lot of microcytic  cells.  There were some spherocytes.  There were no  schistocytes.  She had several hypersegmented polys.  I did not see any  blasts.  There were no nucleated red blood cells.  I saw no rouleaux  formation.  There were no acanthocytes.  I saw no target cells.  There  was no leptocytes.  She had no poikilocytosis.  The remainder of  __________  were unremarkable.   IMPRESSION:  [name] is a nice 30 year old female with marked  anemia.  This is totally unclear in etiology.  This certainly does not  look like she is retic-ing.  I think her corrected retic count is  actually low.   One has to worry about iron deficiency, although one would expect the  mean corpuscular volume to be a lot lower.   A nutritional issue certainly is a possibility.  With the hair loss and  skin lesions, one could worry about a vitamin deficiency.  The fact that  she is low in vitamin K and magnesium also suggests some kind of  nutritional problem.  She certainly could be bulimic.  She does not look  anorexic.   One could postulate an actual bone marrow disorder.  She may have red  cell aplasia given the rapid decrease in her blood count.  This always  needs to be considered.  She does have 2 young kids.  Parvovirus  probably needs to be checked.   I will certainly check antinuclear antibody complement count on her to  make sure she does not have vasculitis.   I will also will need to have a CT scan done to make she is not bleeding  internally.  In addition, we can check for a thymoma which has been  associated with red cell aphasia.   Ultimately, she may need to have a bone marrow test done to figure out  what is going on.  We certainly need to get to the bottom of this.   I sincerely doubt that she would have B12 deficiency.  She does have  several hypersegmented polys on the smear, so this always could be a  possibility.   I do not think this  is autoimmune.  Again, her direct Coombs' is   negative.   This is a truly fascinating case.  We will just have to followup and see  what all the labs show [place].      Rose Phi. Myna Hidalgo, M.D.  Electronically Signed     PRE/MEDQ  D:  02/07/2007  T:  02/07/2007  Job:  [id number]   cc:   L. Lupe Carney, M.D.

## 2010-07-07 NOTE — H&P (Signed)
[name], [name]               ACCOUNT NO.:  [id number]   MEDICAL RECORD NO.:  [id number]          PATIENT TYPE:  INP   LOCATION:  5740                         FACILITY:  [facility]   PHYSICIAN:  Michiel Cowboy, MDDATE OF BIRTH:  07-Dec-1980   DATE OF ADMISSION:  02/03/2007  DATE OF DISCHARGE:                              HISTORY & PHYSICAL   CHIEF COMPLAINT:  Paresthesias.   PRIMARY CARE PHYSICIAN:  Dr. Clovis Riley.   HISTORY OF PRESENT ILLNESS:  This is a 30 year old female with history  of anxiety who presented with paresthesias to her primary care  physician's office.  Her labs were checked and noted to have potassium  down to 7.1 (sic) and albumin down to 2.8 as well as hemoglobin down to  6.8 and blood pressure down to 94/50 and the patient was tachycardic up  to 140.  An [facility] hospitalist was called in to admit the patient as a  direct admit.  The patient was seen by me, Dr. Adela Glimpse.  She stated that  for the past 3 months or so she had been having a little bit of  heaviness to her bleeding which she attributed to getting a Depo shot.  She describes this as having about 5 pads per day, occasionally with  bleeding but the particularly heavy bleeding has stopped actually for  past 1 month.  She has been having just a little spotting for the past 1  month which completely stopped about half a week ago.  So the patient  denies melena, denies bright red blood per rectum, denies any other  bleeding.  The patient denies fevers, chills, change in weight but does  not that she has been having some nausea and vomiting recently for which  she has been taking Phenergan.  She does note also her urine has been  very, very dark.  She describes it almost coca-cola looking dark or very  dark apple juice dark.  Also the patient reports her face has been  slightly more swollen than before.  The patient otherwise has been  feeling actually fairly well and prior to being admitted to the  hospital  was shopping with her brother.  She denies any lightheadedness or  shortness of breath.  Does report that overall she has been feeling weak  and has been having some paresthesias in particular for past few days as  well as feeling of hands drawing up or kind of going into spasms  bilaterally.   PAST MEDICAL HISTORY:  Only significant for anxiety.   ALLERGIES:  MORPHINE.  She has severe crawling sensation all over her  body and she describes wanting to tear her skin off.   MEDICATIONS:  She is on Depo shot.  Occasionally taking Xanax, no more  than 1 pill per week.  Occasionally takes Phenergan as needed for  nausea.  Has been prescribed Zoloft but did not take it.   REVIEW OF SYSTEMS:  Negative except as per HPI.   PHYSICAL EXAMINATION:  VITAL SIGNS:  Temperature 99.3, pulse 133,  respirations 20, blood pressure 79/59, O2 sat 96% on room  air.  GENERAL:  Overall the patient appears to be pale but in no acute  distress.  Does not appear toxic.  LUNGS:  Clear to auscultation bilaterally.  EXTREMITIES:  Lower extremities without edema.  HEART:  Rapid but regular.  No murmurs noted.  ABDOMEN:  Noted for left upper quadrant tenderness.  Unable to palpate  spleen but cannot rule out splenomegaly.  JOINTS:  No crepitus.  No joint swelling noted.  No lymphadenopathy  noted.  Face does appear somewhat swollen.  NEUROLOGICAL:  The patient is intact.   LABS:  White blood cell count 6.3, hemoglobin 4.6, hematocrit 12.6,  platelets 192, sodium 136, potassium less than 2.0, chloride 94, PCO2  28, glucose 117, BUN 1, creatinine 0.6, total bilirubin 1.6, direct  bilirubin 0.3, indirect bilirubin 1.3.  LFTs otherwise unremarkable.  Total protein 5.8.  Albumin 2.6, calcium 6.5, LDH elevated 293.   ASSESSMENT AND PLAN:  This is a 30 year old female with severe anemia  per report, elevated sed rate and possible evidence of hemolysis  although unclear at this point.  Also, evidence of  severe electrolyte  abnormalities.  1. Anemia.  Will admit to telemetry.  Will transfuse 2 units of packed      red blood cells and recheck.  May need further transfusions.  Have      ordered haptoglobin level but it is not back yet.  We will order      DIC panel, coags, hemoccult stools x3, anemia panel.  Will have CBC      smear reviewed.  2. Elevated sed rate.  We will work up for autoimmune disorders with      ANA, ENA, ANCA.  3. Electrolyte abnormality worrisome for poor nutrition.  Will replace      calcium and potassium.  Will also obtain prealbumin, check      orthostatics, we will hydrate, watch on telemetry, low threshold      for moving to stepdown.  4. Decreased appetite.  Will check urine for protein.  5. Question of splenomegaly.  Will obtain abdominal ultrasound.  Will      consider calling hematology/oncology to help [place] further work up      this patient.  Will obtain HIV, RPR, TSH.  Will obtain a urine      pregnancy test and urine toxicology screen.      Michiel Cowboy, MD  Electronically Signed     AVD/MEDQ  D:  02/03/2007  T:  02/05/2007  Job:  [id number]   cc:   L. Lupe Carney, M.D.

## 2010-07-08 ENCOUNTER — Ambulatory Visit (HOSPITAL_COMMUNITY): Payer: Medicaid Other

## 2010-07-10 NOTE — Op Note (Signed)
NAME:  RYNN, MARKIEWICZ A                         ACCOUNT NO.:  [id number]   MEDICAL RECORD NO.:  [id number]                   PATIENT TYPE:  AMB   LOCATION:  SDC                                  FACILITY:  [facility]   PHYSICIAN:  Dois Davenport A. Rivard, M.D.              DATE OF BIRTH:  01-22-1981   DATE OF PROCEDURE:  03/22/2002  DATE OF DISCHARGE:                                 OPERATIVE REPORT   PREOPERATIVE DIAGNOSIS:  Pelvic pain.   POSTOPERATIVE DIAGNOSIS:  Pelvic pain with pelvic adhesions.   ANESTHESIA:  General.   PROCEDURE:  Laparoscopy with lysis of adhesions.   SURGEON:  Crist Fat. Rivard, M.D.   ESTIMATED BLOOD LOSS:  Minimal.   DESCRIPTION OF PROCEDURE:  After being informed of the planned procedure  with possible complications, including bleeding, infection, injury to  bowels, bladder, or ureter with need for laparotomy, informed consent was  obtained.  The patient was taken to OR #8, given general anesthesia with  endotracheal intubation without complication.  She was placed in a lithotomy  position, prepped and draped in a sterile fashion.  Her bladder was emptied  with an in-and-out catheter, and an acorn manipulator was inserted in her  cervix for uterine manipulation.   The umbilical area was then infiltrated with Marcaine 0.25% 5 mL.  We  proceeded with a semi-elliptical incision and insertion of Veress needle and  insufflation of pneumoperitoneum with CO2 at a maximum pressure of 15 mmHg.   The Veress needle was removed, a 10 mm trocar inserted, laparoscope  inserted.  A suprapubic 5 mm trocar was inserted under direct visualization  after insufflation with Marcaine 0.25% 4 mL.   Observation:  Anterior cul-de-sac is normal.  Posterior cul-de-sac is  normal.  Uterus is normal.  Right tube and right ovary are normal.  Left  ovary is normal.  Left tube is normal but is entangled into an adhesion  process with the sigmoid colon, which is also pulled up to the left  pelvic  wall.  We proceed with sharp dissection of this adhesion until the sigmoid  colon is completely freed as well as the tube.  We then irrigate with warm  saline, hemostasis is adequate.  Trocars are removed after evacuating the  pneumoperitoneum.   The 10 mm incision fascia is closed with a figure-of-eight stitch of 0  Vicryl, and the two incisions are then closed with subcuticular sutures of 4-  0 Vicryl.   Instrument and sponge count is complete x2.  Estimated blood loss is  minimal.  The procedure is very well-tolerated by the patient, who is taken  to the recovery room in a well and stable condition.  Crist Fat Rivard, M.D.   SAR/MEDQ  D:  03/22/2002  T:  03/22/2002  Job:  [id number]

## 2010-07-10 NOTE — H&P (Signed)
[name], [name]               ACCOUNT NO.:  [id number]   MEDICAL RECORD NO.:  [id number]          PATIENT TYPE:  INP   LOCATION:  0101                         [facility]ACILITY:  [facility]   PHYSICIAN:  Deirdre Peer. Polite, M.D. DATE OF BIRTH:  1980-11-07   DATE OF ADMISSION:  12/21/2005  DATE OF DISCHARGE:                                HISTORY & PHYSICAL   PRIMARY CARE PHYSICIAN:  L. Lupe Carney, M.D.   CHIEF COMPLAINT:  Abdominal pain, nausea and vomiting.   HISTORY OF PRESENT ILLNESS:  A 30 year old female with a known history of  recurrent UTI who presents to the ED with abdominal discomfort in the right  upper quadrant, nausea and vomiting x3 days.  In fact, the patient states  she has not been feeling well for a month, however, over the last 3 days had  the above complaints to the point that she needed to present to the ED.  She  also thinks that she may have seen some coffee ground color emesis, a small  amount and some blood in her urine.  In the ED the patient was evaluated.  She had a temperature max of 103.1,  pulse as high as 141, respiratory rate 20, sating 100%.  The patient had  labs with a markedly abnormal UA, however, CBC was within normal limits,  mildly elevated LFTs, AST of 70, ALT of 26, bilirubin of 1.2.  Because of  the patient's right quadrant symptoms and abnormal UA, [facility]  were called for evaluation and treatment of pyelonephritis.  At the time of my evaluation, the patient is alert and oriented and the  reported details as stated above admission is deemed necessary for further  evaluation and treatment for pyelonephritis plus or minus nephrolithiasis.   MEDICAL HISTORY:  As stated above.   MEDICATIONS ON ADMISSION:  Xanax p.r.n. for anxiety disorder.   SOCIAL HISTORY:  Positive tobacco.  Social alcohol.  No drugs.   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   ALLERGIES:  THE PATIENT STATES SHE IS ALLERGIC TO:  1. CORTISONE AND  2. MORPHINE WHICH CAUSE PRURITUS.   FAMILY HISTORY:  Noncontributory.   REVIEW OF SYSTEMS:  As stated in HPI.   PHYSICAL EXAMINATION:  GENERAL: The patient is alert and oriented x3.  Mild  to moderate distress secondary to her right quadrant pain.  VITAL SIGNS:  T-max 103.1, BP 99/60, pulse 107, respiratory rate of 22, sat  98%.  HEENT:  Anicteric sclerae.  Moist oral mucosa.  No nodes.  No JVD.  CHEST:  Clear without rales or rhonchi.  CARDIOVASCULAR:  Regular no S3.  ABDOMEN:  Soft.  Positive guarding and tenderness in the right upper  quadrant.  Positive flank pain with palpation.  No masses appreciated.  No  rebound tenderness.  EXTREMITIES:  No clubbing, cyanosis, or edema.   DATA:  Urine pregnancy  test is negative.  UA with specific gravity 1.018,  moderate hemoglobin, greater than 80 ketones, bilirubin negative, total  protein 100, nitrite positive, leukocyte esterase moderate, urine epithelial  cells few, urine WBCs 7-10, urine RBCs 3-6, bacteria many.  CBC:  White  count 9.2, hemoglobin 14.8, hematocrit 42.1, MCV 94, platelets 173,  neutrophil count 89%.  C-MET:  Sodium 134, potassium 4.1, chloride 95,  carbon dioxide 24, glucose 92, BUN 5, creatinine 0.9.  AST 70, ALT 26,  bilirubin 1.2, lipase 14.  No imaging has been ordered.   ASSESSMENT:  1. Pyelonephritis must rule out stone.  2. Fever, T-max 103.  3. Nausea, vomiting with reported coffee ground emesis x1.  4. Mildly elevated liver function tests.   RECOMMENDATIONS:  The patient be admitted to a medicine floor bed for  evaluation and treatment of pyelonephritis plus or minus stone.  The patient  will be treated with aggressive IV fluids, IV antibiotics.  The patient will  be pan cultured.  We will obtain a CT of the abdomen and pelvis  to rule out  associated stone.  We will make further recommendations as deemed necessary.  Thank you in advance.      Deirdre Peer. Polite, M.D.  Electronically Signed     RDP/MEDQ  D:  12/21/2005  T:  12/21/2005  Job:  [id number]   cc:   L. Lupe Carney, M.D.  Fax: [phone]

## 2010-07-10 NOTE — Discharge Summary (Signed)
[name], [name]               ACCOUNT NO.:  [id number]   MEDICAL RECORD NO.:  [id number]          PATIENT TYPE:  INP   LOCATION:  1321                         FACILITY:  [facility]   PHYSICIAN:  Michelene Gardener, MD    DATE OF BIRTH:  November 23, 1980   DATE OF ADMISSION:  12/21/2005  DATE OF DISCHARGE:  12/24/2005                                 DISCHARGE SUMMARY   PRIMARY PHYSICIAN:  L. Lupe Carney, M.D.   DISCHARGE DIAGNOSES:  1. Pyelonephritis.  2. Nausea and vomiting.  3. Anxiety.  4. Tobacco abuse.   DISCHARGE MEDICATIONS:  1. Levaquin 500 mg p.o. daily for 7 days.  2. Vicodin 1-2 tabs p.o. q.4 h. p.r.n.  3. Tylenol 650 q.4 h. if needed for fever.   CONSULTATIONS:  None.   PROCEDURES:   FOLLOWUP APPOINTMENTS:  Elsworth Soho, M.D. in one week.   CONDITION ON DISCHARGE:  Stable.   RADIOLOGY STUDIES:  During the hospitalization include CT scan of the  abdomen and pelvis done on December 22, 2005, showed no acute findings and it  also showed diffuse fatty infiltration of the liver.  Pelvis CT showed no  evidence of ureteral calculi or dilatation.   REASON FOR HOSPITALIZATION:  This is a 30 year old female with no  significant past medical history presented to the ER with nausea, vomiting,  and increasing right sided abdominal pain.   HOSPITAL COURSE BY MEDICAL PROBLEM:  1. Pyelonephritis.  This patient's presentation was consistent with      pyelonephritis.  She had a high fever, more than 102 with chills and      she had some burning urination.  Her urinalysis was suggestive of UTI.      The patient was admitted to the hospital because of low blood pressure.      She was started on IV antibiotics, where she was given Rocephin IV.      Her urine and blood pressures were sent.  Her blood pressure came to be      negative.  Her urine culture is positive and sensitive to all      antibiotics.  We switched to Levaquin 500 mg and will be taken for      another 7 days.  A CT  scan of the abdomen and pelvis was done and      results were mentioned above and it was negative.  It only showed fatty      infiltration of the liver that might need to be followed by her primary      physician as an outpatient.  2. Nausea and vomiting.  That was resolved and was secondary to infection.      The patient was given IV fluids and antiemetics.  3. Tobacco abuse.  The patient was advised about the risk.  4. Fatty infiltration of the liver that was seen on the CT scan of the      abdomen.  This can be following as an outpatient with Dr. Lupe Carney.   Total assessment time is 40 minutes.  Michelene Gardener, MD  Electronically Signed     NAE/MEDQ  D:  12/24/2005  T:  12/24/2005  Job:  [id number]   cc:   L. Lupe Carney, M.D.  Fax: [phone]

## 2010-07-15 NOTE — Discharge Summary (Signed)
[name], [name]               ACCOUNT NO.:  [id number]  MEDICAL RECORD NO.:  [id number]           PATIENT TYPE:  I  LOCATION:  1533                         FACILITY:  [facility]  PHYSICIAN:  Rosanna Randy, MDDATE OF BIRTH:  Jul 03, 1980  DATE OF ADMISSION:  06/28/2010 DATE OF DISCHARGE:  07/02/2010                              DISCHARGE SUMMARY   PRIMARY CARE PHYSICIAN:  L. Lupe Carney, M.D.  DISCHARGE DIAGNOSES: 1. Diabetes ketoacidosis. 2. Diabetes mellitus with a hemoglobin A1c of 7.4. 3. Hypokalemia. 4. Hypophosphatasemia. 5. Thrombocytopenia. 6. Anemia. 7. Strep throat infection. 8. Depression/anxiety. 9. Hypothyroidism. 10.Chronic back pain. 11.Leukopenia. 12.Gastroesophageal reflux disease. 13.Recent preterm delivery with baby loss in April 2012.  DISCHARGE MEDICATIONS: 1. Amoxicillin/clavulanate 875 mg by mouth twice a day in order to be     taking for 2 days. 2. Ferrous sulfate 325 mg by mouth twice a day. 3. Insulin lispro 5 units subcutaneously 3 times a day using 5 units     at meals plus 1 x-ray unit for every 60 mL above 80 mL/dL of blood     sugar. 4. Humulin N 5 units subcutaneously every 8 hours. 5. Lorazepam 1 mg 1 tablet by mouth every 6 hours as needed for     anxiety. 6. Omeprazole 20 mg 1 capsule by mouth daily. 7. Synthroid 75 mcg 1 tablet by mouth daily. 8. Tramadol 50 mg 1 tablet by mouth daily every 6 hours as needed for     pain.  DISPOSITION AND FOLLOWUP:  The patient had been discharged in a stable condition without having any nausea, vomiting, abdominal pain, or currently having any difficulty tolerating her diet.  The patient had been instructed to arrange a hospital followup visit with the patient's primary care physician, Dr. Clovis Riley, and also with her endocrinologist, Dr. Sharl Ma, over the next 5-10 days with both of them especially with her endocrinologist in order to look over the treatment of her diabetes and to adjust the  use of her insulin doses.  The patient also need to be seen by Dr. Clovis Riley in order to follow on her platelets for her thrombocytopenia and also to follow on her leukopenia, which at this point most likely had been associated with DKA and also to follow on other chronic medical problems and adjust her medications as needed especially the medications for her chronic back pain and also the medications for her anxiety.  PROCEDURES PERFORMED DURING THIS HOSPITALIZATION:  No procedures were performed during this admission.  CONSULTATIONS:  No consultations were made.  BRIEF HISTORY OF PRESENT ILLNESS:  [name] is a 30 year old female with autoimmune disorders, especially autoimmune hemolytic anemia who was initially diagnosed with steroid-induced diabetes, but turns out to be type 1 diabetic, probably from her autoimmune disease.  The patient recently lost her baby through preterm labor at 106 weeks on April 19th and since then she had not been herself according to the family members. She wanted to cremate the baby and keept it with her, but the family did not do it, the baby was buried.  She had been depressed and sent home. She claimed  that she had been taking her insulin, which she had been started on including NovoLog and Humulin sliding scale, but she had been seen mostly in bed, not eating much, and after asking with further details she expressed to be missing a couple of her insulin doses.  The patient came into the hospital secondary to worsening nausea, vomiting, and was also confused talking about death.  She seemed to be having some form of delirium at the moment of admission.  The patient was unable to give any adequate history.  The workup in the emergency department showed that she was in a severe diabetic ketoacidosis.  She is being admitted and [facility] were called to provide treatment and further workup.  PAST MEDICAL HISTORY: 1. Diabetes type 1. 2.  Autoimmune hemolytic anemia. 3. History of liver fibrosis. 4. Recent preterm labor with delivery of a dead baby. 5. Hypothyroidism. 6. History of tobacco dependency that she is not current smoking. 7. Other medical problems as dictated on discharge diagnosis.  For further details on patient's history of present illness, please refer to Dr. Mikeal Hawthorne dictation on Jun 28, 2010.  PERTINENT LABORATORY DATA:  Throughout this hospitalization includes on admission a CBC with differential that showed white blood cells of 12.6 with a hemoglobin of 15.2 and platelets 191.  She had a BMET with a sodium of 125, potassium 5.4, chloride 80, bicarb less than 5, blood sugar was more than 520, BUN 10, creatinine 1.6 with a GFR of 55, magnesium was 2.2, phosphorus was 3.7.  The patient had a negative MRSA by PCR.  She had a rapid strep screen that was positive and she was complaining of a mild sore throat.  A urinalysis was yellow with more than 80 ketones, large amount of blood, 100 protein, negative nitrite, negative leukocyte.  ABG on room air demonstrated a pH of 7.2 with a pCO2 of 14.6, a pO2 of 107, a bicarb of 5.9, base deficit was 12.5, oxygen saturation was 98.6.  The patient had a TSH of 3.4, T3 was 35, free T4 was 0.59, hemoglobin A1c was 7.4.  Subsequent electrolytes checked demonstrated a magnesium of 1.8 with a phosphorus of 0.5 on Jun 30, 2010.  The patient had a subsequent blood work done throughout this hospitalization, mostly to follow on her basic metabolic panel due to her severe diabetes ketoacidosis and low bicarb and at the moment of discharge all electrolytes were normal and her bicarb was also back to normal.  HOSPITAL COURSE BY PROBLEMS: 1. The patient's DKA.  The patient was started on heparin drip and     fluid resuscitation with a subsequent transitioned to long-acting     insulin and also sliding scale insulin throughout her diabetes     ketoacidosis protocol treatment.  She  was exchanged to D5 abnormal     setting, will continue her on the insulin drip in order to make     sure that the anion gap and also the bicarb were close and at least     around 18 respectively.  Once her DKA and electrolytes     abnormalities were corrected, the patient was placed back on her     insulin regimen instructed by the endocrinologist and had been     advised to follow with primary care physician and endocrinologist     for further adjustment of her insulin therapy. 2. The patient's diabetes, which looks to be pretty well controlled     with a hemoglobin A1c  of 7.4.  We are going to continue outside     regimen.  The patient had received advice of no skipping meals or     skipping insulin doses, especially the  baseline insulin that she     is instructed to take by her endocrinologist.  She is going to     follow with Dr. Sharl Ma, endocrinologist, and also with Dr. Clovis Riley     for further adjustment on her treatment. 3. The patient's pseudohyponatremia secondary to elevated blood sugar,     which was corrected at the moment that her blood sugar was under     control. 4. The patient's hypokalemia also secondary to the use of insulin and     also body contraction due to the nausea and vomiting even that she     was with the lack of insulin on hyperkalemia on admission.     Electrolytes were repleted accordingly throughout this     hospitalization and at discharge was back to normal 5. The patient's hypophosphatasemia reaching a level of 0.5.  This was     also repleted at discharge was back to normal. 6. The patient's thrombocytopenia, which is chronically.  There is no     signs of bleeding or any excessive bruises at this moment.  Plan is     to continue monitor rising her and to follow with her primary care     physician. 7. The patient's anemia.  She had an anemia panel that demonstrated     mild iron deficiency on top of her hemolytic anemia.  She was     started on  ferrous sulfate twice a day and is going to follow as an     outpatient with primary care physician to recheck on her     hemoglobin. 8. If the patient has a strep throat infection, plan is to continue     using the Augmentin for 2 more days to complete antibiotic therapy. 9. The patient's anxiety, she is going to continue using lorazepam and     is going to follow with her primary care physician as an     outpatient.  There is concerns of depression, but in fact after her     cute baby loss, this is more grieving than really depression.  She     need to be evaluated as an outpatient at least 6 weeks after this     whole event has past and if needed at that point started on     antidepressant medication.  A recommendation will be to place the     patient on Zoloft, which can actually treat also held her anxiety. 10.The patient's hypothyroidism with a normal TSH, but low T4.  Plan     is to continue the patient on Synthroid as previously indicated.     Per the patient she had not been using the medication recently.     This is another problem that is going to be followed by primary     care physician and also endocrinologist as an outpatient. 11.The patient's chronic back pain, we are going to continue using     tramadol as prescribed. 12.The patient's leukopenia most likely secondary to a combination of     the whole ketoacidosis that she went through and also her liver     fibrosis at this point, white blood cells are 2.5.  The patient is     going to follow with primary care physician  for followup of her     leukopenia. 13.The patient's gastroesophageal reflux disease.  She is going to     continue using PPI.    The rest of her medical problems remained stable throughout this  hospitalization and the plan is to continue same home regimen.   PHYSICAL EXAMINATION:  At discharge the patient's VITAL SIGNS:  Temperature 98.6, heart rate 71, respiratory rate 16, blood pressure 103/69,  oxygen saturation 100% on room air.  Phosphorus 2.7, magnesium 2.0.  The patient had a white blood cells of 2.5 with a hemoglobin of 12.4, platelets 97.  Sodium 133, potassium 4.2, chloride 97, bicarb 27, BUN 3, creatinine 0.47, blood sugar was 250 on discharge.     Rosanna Randy, MD     CEM/MEDQ  D:  07/02/2010  T:  07/03/2010  Job:  [id number]  cc:   Tonita Cong, M.D. Fax: [phone]  L. Lupe Carney, M.D. Fax: [phone]  Electronically Signed by Vassie Loll MD on 07/15/2010 10:22:57 PM

## 2010-07-23 NOTE — H&P (Signed)
NAMEHAYDEE, [name]               ACCOUNT NO.:  [id number]  MEDICAL RECORD NO.:  [id number]           PATIENT TYPE:  E  LOCATION:  WLED                         FACILITY:  [facility]  PHYSICIAN:  Lonia Blood, M.D.      DATE OF BIRTH:  1980/08/04  DATE OF ADMISSION:  06/28/2010 DATE OF DISCHARGE:                             HISTORY & PHYSICAL   PRIMARY CARE PHYSICIAN:  L. Lupe Carney, MD  PRESENTING COMPLAINT:  Nausea, vomiting, and high blood sugar.  HISTORY OF PRESENT ILLNESS:  The patient is a 30 year old female with autoimmune disorders, especially autoimmune hemolytic anemia, who was initially diagnosed with steroid-induced diabetes but turns out to be type 1 diabetes, probably from her autoimmune disease.  The patient recently lost her baby through preterm labor at 83 weeks on April 19. Since then she has not been herself according to family.  She wanted to cremate the baby and keep it with her, but the family did not do it, the baby was buried. She has been depressed and staying home.  She claims she has been taking her insulin which she had been started on including NovoLog and Humulin R sliding scale, but she has been seen mostly in bed.  She came in today secondary to worsening nausea and vomiting and abdominal pain.  She is panting and breathing hard and looking very sick, so family brought her in here.  The patient was confused, talking about death.  She seems to be having some form of delirium at the moment.  She is not able to give adequate history.  Her workup in the ED showed that she is in severe diabetic ketoacidosis.  Hence, she is being admitted.  Her past medical history is as above, currently type 1 diabetes, autoimmune hemolytic anemia, history of liver fibrosis, hyperlipidemia, recent preterm labor and delivery with loss of baby, hypothyroidism, tobacco dependence.  ALLERGIES: 1. CODEINE 2. MORPHINE.  CURRENT MEDICATIONS:  Humulin R and NovoLog  insulin.  SOCIAL HISTORY:  She lives in [place], [place].  She lives with her fiance.  They are engaged and getting ready to be married. She smokes about half to one pack per day.  Occasional alcohol but no IV drug use.  FAMILY HISTORY:  Denied any significant family history.  REVIEW OF SYSTEMS:  All systems reviewed and negative except as per HPI, although the patient is shaking and but still able to respond to direct questioning.  PHYSICAL EXAMINATION:  VITAL SIGNS:  On exam, temperature is 98.08, blood pressure 137/91, her pulse 190, respiratory 26 and 100% of 2 liters. GENERAL: The patient looks acutely ill-looking with ketotic breath, confused but arousable, responding to questions. HEENT: PERRL.  EOMI.  She has very dry mucous membranes.  No pallor, no jaundice.  No rhinorrhea. NECK:  Neck is supple. No significant JVD, no lymphadenopathy. RESPIRATORY:  She has good air entry bilaterally, but she is tachypneic. No wheezes or rales. CARDIOVASCULAR:  She is tachycardic. ABDOMEN:  Soft, full with diffuse tenderness. EXTREMITIES: No edema, cyanosis, or clubbing. SKIN:  No rashes.  No ulcers.  No petechiae. MUSCULOSKELETAL: No joint swelling  or tenderness.  LABS:  Sodium is 125, potassium 5.4, chloride 80, her bicarb is less than five,  glucose 520, BUN of 10, creatinine 1.16 and calcium 9.3. White count is 12.6, hemoglobin 15.2 and platelet count of 191.  Urine ketones positive.  Urinalysis is currently pending.  ASSESSMENT:  This is a 30 year old female with known history of diabetes presenting with what appears to be acute DKA. Based on the criteria, she seemed to have severe diabetic ketoacidosis with bicarb of less than 5 and anion gap of 40.  Plan therefore is: 1. Severe DVT ketoacidosis. We will admit the patient to the ICU.     Start her on IV hydration with bolus of normal saline, IV insulin     will go by protocol with serial BMET.  Once the patient is out  of     DKA, will transition her to subcutaneous insulin at the right     doses. 2. History of autoimmune hemolytic anemia.  Again this is chronic.     She has been on steroids.  It is not clear if the patient is taking     it at the moment but will find out. 3. Hyperlipidemia.  Will check fasting lipid panel as well. 4. Hypothyroidism. I will check TSH and free T4, see if the patient     needs to continue taking Synthroid. Pseudohyponatremia from her DKA     and hypoglycemia. Severe metabolic acidosis from the DKA.  The     patient may need to be on the bicarb drip if her acidosis does not     correct quickly enough with the insulin. 5. Tobacco dependence.  I will put her on some nicotine patch and once     she is stable, will go through with tobacco cessation counseling. 6. Other than that, we will admit the patient to critical care     service.     Lonia Blood, M.D.     Verlin Grills  D:  06/29/2010  T:  06/29/2010  Job:  [id number]  Electronically Signed by Lonia Blood M.D. on 07/23/2010 11:44:40 AM

## 2010-08-14 ENCOUNTER — Ambulatory Visit (HOSPITAL_COMMUNITY): Payer: Medicaid Other | Admitting: Psychology

## 2010-11-13 LAB — CBC
HCT: 33.6 — ABNORMAL LOW
Hemoglobin: 11.9 — ABNORMAL LOW
MCV: 92.3
RBC: 3.64 — ABNORMAL LOW
WBC: 6.9

## 2010-11-13 LAB — BASIC METABOLIC PANEL
BUN: 2 — ABNORMAL LOW
Chloride: 97
GFR calc Af Amer: >60
Potassium: 3.4 — ABNORMAL LOW
Sodium: 129 — ABNORMAL LOW

## 2010-11-13 LAB — URINALYSIS, ROUTINE W REFLEX MICROSCOPIC
Bilirubin Urine: NEGATIVE
Ketones, ur: NEGATIVE
Nitrite: NEGATIVE
Protein, ur: NEGATIVE
Urobilinogen, UA: 1

## 2010-11-13 LAB — URINE MICROSCOPIC-ADD ON

## 2010-11-13 LAB — POCT PREGNANCY, URINE: Operator id: 161631

## 2010-11-27 LAB — BASIC METABOLIC PANEL
BUN: 1 — ABNORMAL LOW
BUN: 11
BUN: <1 — ABNORMAL LOW
BUN: <1 — ABNORMAL LOW
CO2: 21
CO2: 23
CO2: 24
Calcium: 8.6
Chloride: 107
Chloride: 107
Chloride: 108
Chloride: 109
Creatinine, Ser: 0.49
Creatinine, Ser: 0.51
Creatinine, Ser: 0.56
GFR calc Af Amer: >60
GFR calc Af Amer: >60
GFR calc non Af Amer: >60
GFR calc non Af Amer: >60
GFR calc non Af Amer: >60
Glucose, Bld: 100 — ABNORMAL HIGH
Glucose, Bld: 191 — ABNORMAL HIGH
Glucose, Bld: 88
Potassium: 3.2 — ABNORMAL LOW
Potassium: 3.6
Potassium: 4.3
Potassium: 4.7
Sodium: 135
Sodium: 136
Sodium: 140

## 2010-11-27 LAB — RETICULOCYTES
RBC.: 2.73 — ABNORMAL LOW
RBC.: 2.91 — ABNORMAL LOW
Retic Count, Absolute: 114.7
Retic Count, Absolute: 138.2
Retic Count, Absolute: 139.7
Retic Ct Pct: 4.8 — ABNORMAL HIGH

## 2010-11-27 LAB — COMPREHENSIVE METABOLIC PANEL
ALT: 18
AST: 37
AST: 52 — ABNORMAL HIGH
Albumin: 2.2 — ABNORMAL LOW
Alkaline Phosphatase: 51
CO2: 22
CO2: 23
Calcium: 7.5 — ABNORMAL LOW
Chloride: 109
Creatinine, Ser: 0.37 — ABNORMAL LOW
Creatinine, Ser: 0.85
GFR calc Af Amer: >60
GFR calc Af Amer: >60
GFR calc non Af Amer: >60
GFR calc non Af Amer: >60
Potassium: 3.6
Sodium: 141
Total Bilirubin: 0.7
Total Protein: 8.3

## 2010-11-27 LAB — CBC
HCT: 22.7 — ABNORMAL LOW
HCT: 23.6 — ABNORMAL LOW
HCT: 26.9 — ABNORMAL LOW
HCT: 27.6 — ABNORMAL LOW
HCT: 32.2 — ABNORMAL LOW
Hemoglobin: 10.5 — ABNORMAL LOW
Hemoglobin: 8.1 — ABNORMAL LOW
Hemoglobin: 8.6 — ABNORMAL LOW
Hemoglobin: 9.4 — ABNORMAL LOW
Hemoglobin: 9.8 — ABNORMAL LOW
MCHC: 32.5
MCHC: 34.1
MCHC: 34.3
MCHC: 35
MCHC: 35
MCHC: 35.4
MCV: 88.4
MCV: 88.6
MCV: 90.2
MCV: 90.5
MCV: 90.8
MCV: 92
Platelets: 140 — ABNORMAL LOW
Platelets: 141 — ABNORMAL LOW
Platelets: 157
Platelets: 197
Platelets: 252
RBC: 2.74 — ABNORMAL LOW
RBC: 3.12 — ABNORMAL LOW
RBC: 3.63 — ABNORMAL LOW
RBC: 3.68 — ABNORMAL LOW
RDW: 14.5
RDW: 14.5
RDW: 15.9 — ABNORMAL HIGH
WBC: 5.2
WBC: 6.8
WBC: 7.1
WBC: 9.2

## 2010-11-27 LAB — POCT PREGNANCY, URINE
Operator id: 208821
Preg Test, Ur: NEGATIVE

## 2010-11-27 LAB — DIFFERENTIAL
Basophils Absolute: 0
Basophils Relative: 0
Eosinophils Absolute: 0
Eosinophils Relative: 0
Lymphocytes Relative: 13
Lymphocytes Relative: 13
Lymphs Abs: 1.2
Monocytes Absolute: 0.5
Monocytes Relative: 4
Monocytes Relative: 5
Neutro Abs: 7.6
Neutrophils Relative %: 82 — ABNORMAL HIGH

## 2010-11-27 LAB — URINALYSIS, ROUTINE W REFLEX MICROSCOPIC
Bilirubin Urine: NEGATIVE
Glucose, UA: 500 — AB
Ketones, ur: NEGATIVE
Ketones, ur: NEGATIVE
Leukocytes, UA: NEGATIVE
Nitrite: NEGATIVE
pH: 7
pH: 7

## 2010-11-27 LAB — PROTIME-INR: INR: 1

## 2010-11-27 LAB — SAVE SMEAR

## 2010-11-27 LAB — URINE MICROSCOPIC-ADD ON

## 2010-11-27 LAB — T4, FREE: Free T4: 1

## 2010-11-27 LAB — HEPATITIS B SURFACE ANTIGEN: Hepatitis B Surface Ag: NEGATIVE

## 2010-11-27 LAB — MAGNESIUM
Magnesium: 1.8
Magnesium: 2.2

## 2010-11-27 LAB — APTT: aPTT: 26

## 2010-11-27 LAB — HEPATITIS C ANTIBODY: HCV Ab: NEGATIVE

## 2010-11-27 LAB — T3, FREE: T3, Free: 3 (ref 2.3–4.2)

## 2010-11-27 LAB — EPSTEIN BARR VIRUS(EBV) BY PCR: EBV DNA, PCR: NEGATIVE

## 2010-11-27 LAB — PHOSPHORUS: Phosphorus: 2.3

## 2010-11-27 LAB — LACTATE DEHYDROGENASE: LDH: 249

## 2010-11-30 LAB — DIFFERENTIAL
Basophils Absolute: 0
Basophils Relative: 0
Eosinophils Absolute: 0.1 — ABNORMAL LOW
Eosinophils Relative: 1
Eosinophils Relative: 1
Eosinophils Relative: 1
Lymphocytes Relative: 28
Lymphs Abs: 1.8
Monocytes Absolute: 0.2
Monocytes Relative: 7
Neutro Abs: 4
Neutrophils Relative %: 64

## 2010-11-30 LAB — CARDIAC PANEL(CRET KIN+CKTOT+MB+TROPI)
CK, MB: 0.2 — ABNORMAL LOW
CK, MB: 0.3
Relative Index: INVALID
Relative Index: INVALID
Total CK: 89
Troponin I: 0.02
Troponin I: 0.03

## 2010-11-30 LAB — BASIC METABOLIC PANEL
BUN: 1 — ABNORMAL LOW
BUN: 2 — ABNORMAL LOW
BUN: <1 — ABNORMAL LOW
BUN: <1 — ABNORMAL LOW
CO2: 20
CO2: 20
CO2: 22
Calcium: 6.6 — ABNORMAL LOW
Calcium: 6.6 — ABNORMAL LOW
Chloride: 109
Chloride: 112
Chloride: 113 — ABNORMAL HIGH
Chloride: 114 — ABNORMAL HIGH
Creatinine, Ser: 0.47
Creatinine, Ser: 0.53
Creatinine, Ser: 0.6
GFR calc Af Amer: >60
GFR calc Af Amer: >60
GFR calc Af Amer: >60
GFR calc non Af Amer: >60
Glucose, Bld: 102 — ABNORMAL HIGH
Potassium: 2.6 — CL
Potassium: 4.1
Sodium: 138

## 2010-11-30 LAB — C3 COMPLEMENT: C3 Complement: 83 — ABNORMAL LOW

## 2010-11-30 LAB — URINALYSIS, ROUTINE W REFLEX MICROSCOPIC
Bilirubin Urine: NEGATIVE
Glucose, UA: NEGATIVE
Glucose, UA: NEGATIVE
Ketones, ur: NEGATIVE
Protein, ur: NEGATIVE
Specific Gravity, Urine: >1.046 — ABNORMAL HIGH
Urobilinogen, UA: 0.2
pH: 7

## 2010-11-30 LAB — CREATININE CLEARANCE, URINE, 24 HOUR
Creatinine, 24H Ur: 609 — ABNORMAL LOW
Creatinine: 0.37 — ABNORMAL LOW

## 2010-11-30 LAB — MULTIPLE MYELOMA PANEL, SERUM
Albumin ELP: 51.5 — ABNORMAL LOW
IgG (Immunoglobin G), Serum: 1170
IgM, Serum: 193
Total Protein: 5.4 — ABNORMAL LOW

## 2010-11-30 LAB — CBC
HCT: 12.6 — ABNORMAL LOW
HCT: 17.5 — ABNORMAL LOW
HCT: 23 — ABNORMAL LOW
Hemoglobin: 4.6 — CL
Hemoglobin: 6.1 — CL
Hemoglobin: 6.7 — CL
Hemoglobin: 8.3 — ABNORMAL LOW
MCHC: 34.7
MCHC: 35.2
MCHC: 35.3
MCHC: 36.5 — ABNORMAL HIGH
MCV: 89.4
MCV: 89.6
MCV: 90.3
MCV: 91.5
Platelets: 116 — ABNORMAL LOW
Platelets: 121 — ABNORMAL LOW
Platelets: 147 — ABNORMAL LOW
Platelets: 192
RBC: 1.95 — ABNORMAL LOW
RBC: 2.12 — ABNORMAL LOW
RBC: 2.15 — ABNORMAL LOW
RBC: 2.52 — ABNORMAL LOW
RDW: 13.2
RDW: 13.5
WBC: 3.8 — ABNORMAL LOW
WBC: 4.8
WBC: 4.9

## 2010-11-30 LAB — CULTURE, BLOOD (ROUTINE X 2): Culture: NO GROWTH

## 2010-11-30 LAB — LIPID PANEL
LDL Cholesterol: 103 — ABNORMAL HIGH
Total CHOL/HDL Ratio: 7.5
VLDL: 66 — ABNORMAL HIGH

## 2010-11-30 LAB — DIC (DISSEMINATED INTRAVASCULAR COAGULATION)PANEL
D-Dimer, Quant: 0.26
INR: 1.1
Smear Review: NONE SEEN
aPTT: 29

## 2010-11-30 LAB — GLUCOSE 6 PHOSPHATE DEHYDROGENASE: G-6-PD, Quant: 11 U/g{Hb} (ref 7–20)

## 2010-11-30 LAB — COMPREHENSIVE METABOLIC PANEL
ALT: 12
AST: 37
AST: 45 — ABNORMAL HIGH
Albumin: 2 — ABNORMAL LOW
Albumin: 2.4 — ABNORMAL LOW
Alkaline Phosphatase: 64
BUN: <1 — ABNORMAL LOW
BUN: <1 — ABNORMAL LOW
CO2: 19
Calcium: 5.8 — CL
Calcium: 6.5 — ABNORMAL LOW
Chloride: 107
Creatinine, Ser: 0.54
Creatinine, Ser: 0.59
GFR calc Af Amer: >60
GFR calc non Af Amer: >60
Glucose, Bld: 117 — ABNORMAL HIGH
Potassium: 3.5
Sodium: 139
Total Bilirubin: 1.4 — ABNORMAL HIGH
Total Protein: 4.6 — ABNORMAL LOW
Total Protein: 5.8 — ABNORMAL LOW

## 2010-11-30 LAB — PHOSPHORUS
Phosphorus: 1.5 — ABNORMAL LOW
Phosphorus: 2.3

## 2010-11-30 LAB — DRUG SCREEN PANEL (SERUM)
Benzodiazepine Scrn: NEGATIVE
Methadone (Dolophine), Serum: NEGATIVE
Opiates, Blood: NEGATIVE
Phencyclidine, Serum: NEGATIVE
Propoxyphene,Serum: NEGATIVE

## 2010-11-30 LAB — BLOOD GAS, ARTERIAL
Bicarbonate: 18.6 — ABNORMAL LOW
Drawn by: 246861
FIO2: 0.21
O2 Saturation: 98.7
Patient temperature: 98.6

## 2010-11-30 LAB — MAGNESIUM
Magnesium: 1.4 — ABNORMAL LOW
Magnesium: 2
Magnesium: 2
Magnesium: 2.1

## 2010-11-30 LAB — MAGNESIUM, URINE, 24 HOUR: Magnesium, Ur: 1.7

## 2010-11-30 LAB — RETICULOCYTES: Retic Ct Pct: 1.8

## 2010-11-30 LAB — FERRITIN: Ferritin: 1218 — ABNORMAL HIGH (ref 10–291)

## 2010-11-30 LAB — URINE MICROSCOPIC-ADD ON

## 2010-11-30 LAB — HEPATIC FUNCTION PANEL
ALT: 15
AST: 40 — ABNORMAL HIGH
Albumin: 2.3 — ABNORMAL LOW
Bilirubin, Direct: 0.3

## 2010-11-30 LAB — TYPE AND SCREEN

## 2010-11-30 LAB — TSH: TSH: 3.812

## 2010-11-30 LAB — RPR: RPR Ser Ql: NONREACTIVE

## 2010-11-30 LAB — PREPARE RBC (CROSSMATCH)

## 2010-11-30 LAB — MISCELLANEOUS TEST: Miscellaneous Test Results: 1.2

## 2010-11-30 LAB — COPPER, SERUM: Copper: 75 ug/dL (ref 70–155)

## 2010-11-30 LAB — DRUGS OF ABUSE SCREEN W/O ALC, ROUTINE URINE
Benzodiazepines.: POSITIVE — AB
Cocaine Metabolites: NEGATIVE
Methadone: NEGATIVE
Opiate Screen, Urine: NEGATIVE
Phencyclidine (PCP): NEGATIVE

## 2010-11-30 LAB — LACTATE DEHYDROGENASE: LDH: 293 — ABNORMAL HIGH

## 2010-11-30 LAB — PROTEIN, URINE, 24 HOUR
Collection Interval-UPROT: 24
Protein, 24H Urine: 272 — ABNORMAL HIGH
Urine Total Volume-UPROT: 3400

## 2010-11-30 LAB — EXTRACTABLE NUCLEAR ANTIGEN ANTIBODY
ENA SM Ab Ser-aCnc: 0.2 AI (ref <1.0)
Ribonucleic Protein(ENA) Antibody, IgG: <0.2 AI (ref <1.0)
SSA (Ro) (ENA) Antibody, IgG: <0.2 AI (ref <1.0)
Scleroderma (Scl-70) (ENA) Antibody, IgG: <0.2 AI (ref <1.0)
ds DNA Ab: <1 IU/mL (ref <5)

## 2010-11-30 LAB — BENZODIAZEPINE, QUANTITATIVE, URINE
Alprazolam (GC/LC/MS), ur confirm: 210 ng/mL
Flurazepam GC/MS Conf: NEGATIVE

## 2010-11-30 LAB — PROTEIN, TOTAL: Total Protein: 4.8 — ABNORMAL LOW

## 2010-11-30 LAB — IGE: IgE (Immunoglobulin E), Serum: 8

## 2010-11-30 LAB — PARVOVIRUS B19 ANTIBODY, IGG AND IGM: Parovirus B19 IgG Abs: 5 Index — ABNORMAL HIGH (ref <0.9)

## 2010-11-30 LAB — ANTI-NEUTROPHIL ANTIBODY: Cytoplasmic Neutrophilic Ab: <1:20 {titer}

## 2010-11-30 LAB — PREALBUMIN: Prealbumin: 21.8

## 2010-11-30 LAB — HIV ANTIBODY (ROUTINE TESTING W REFLEX): HIV: NONREACTIVE

## 2010-11-30 LAB — DIRECT ANTIGLOBULIN TEST (NOT AT ARMC): DAT, complement: NEGATIVE

## 2010-11-30 LAB — EBV AB TO VIRAL CAPSID AG PNL, IGG+IGM: EBV VCA IgM: 0.51

## 2010-11-30 LAB — FOLATE: Folate: 8.7

## 2010-11-30 LAB — ABO/RH: ABO/RH(D): A POS

## 2010-11-30 LAB — URINE CULTURE: Colony Count: >=100000

## 2010-11-30 LAB — METHYLMALONIC ACID, SERUM: Methylmalonic Acid, Quantitative: 116 nmol/L (ref 87–318)

## 2010-11-30 LAB — ANTI-DNASE B ANTIBODY: Anti-DNAse-B: <70 U/mL (ref 0–120)

## 2010-11-30 LAB — IMMUNOFIXATION ELECTROPHORESIS

## 2010-11-30 LAB — HOMOCYSTEINE: Homocysteine: 12.9

## 2010-11-30 LAB — C4 COMPLEMENT: Complement C4, Body Fluid: 9 — ABNORMAL LOW

## 2010-11-30 LAB — IGG, IGA, IGM: IgM, Serum: 155

## 2010-11-30 LAB — IRON AND TIBC: TIBC: 185 — ABNORMAL LOW

## 2011-04-20 ENCOUNTER — Emergency Department (HOSPITAL_COMMUNITY): Payer: Medicaid Other

## 2011-04-20 ENCOUNTER — Encounter (HOSPITAL_COMMUNITY): Payer: Self-pay | Admitting: Emergency Medicine

## 2011-04-20 ENCOUNTER — Inpatient Hospital Stay (HOSPITAL_COMMUNITY)
Admission: EM | Admit: 2011-04-20 | Discharge: 2011-04-23 | DRG: 639 | Disposition: A | Discharge status: Home / Self Care | Payer: Medicaid Other | Attending: Internal Medicine | Admitting: Internal Medicine

## 2011-04-20 DIAGNOSIS — E1010 Type 1 diabetes mellitus with ketoacidosis without coma: Secondary | ICD-10-CM

## 2011-04-20 DIAGNOSIS — R112 Nausea with vomiting, unspecified: Secondary | ICD-10-CM | POA: Diagnosis present

## 2011-04-20 DIAGNOSIS — E1110 Type 2 diabetes mellitus with ketoacidosis without coma: Secondary | ICD-10-CM

## 2011-04-20 DIAGNOSIS — A088 Other specified intestinal infections: Secondary | ICD-10-CM | POA: Diagnosis present

## 2011-04-20 DIAGNOSIS — R109 Unspecified abdominal pain: Secondary | ICD-10-CM | POA: Diagnosis present

## 2011-04-20 DIAGNOSIS — R197 Diarrhea, unspecified: Secondary | ICD-10-CM | POA: Diagnosis present

## 2011-04-20 DIAGNOSIS — R0682 Tachypnea, not elsewhere classified: Secondary | ICD-10-CM | POA: Diagnosis present

## 2011-04-20 DIAGNOSIS — Z9641 Presence of insulin pump (external) (internal): Secondary | ICD-10-CM

## 2011-04-20 DIAGNOSIS — E782 Mixed hyperlipidemia: Secondary | ICD-10-CM

## 2011-04-20 DIAGNOSIS — Z23 Encounter for immunization: Secondary | ICD-10-CM

## 2011-04-20 LAB — URINALYSIS, ROUTINE W REFLEX MICROSCOPIC
Glucose, UA: >1000 mg/dL — AB
Protein, ur: NEGATIVE mg/dL
Specific Gravity, Urine: 1.025 (ref 1.005–1.030)

## 2011-04-20 LAB — BASIC METABOLIC PANEL
BUN: 24 mg/dL — ABNORMAL HIGH (ref 6–23)
CO2: <5 mEq/L — CL (ref 19–32)
Chloride: 82 mEq/L — ABNORMAL LOW (ref 96–112)
GFR calc Af Amer: 88 mL/min — ABNORMAL LOW (ref >90)
Glucose, Bld: 643 mg/dL (ref 70–99)
Potassium: 5.1 mEq/L (ref 3.5–5.1)

## 2011-04-20 LAB — BLOOD GAS, VENOUS
FIO2: 0.21 %
Patient temperature: 98.6
pH, Ven: 7.059 — CL (ref 7.250–7.300)

## 2011-04-20 LAB — CBC
HCT: 41.6 % (ref 36.0–46.0)
Hemoglobin: 14.2 g/dL (ref 12.0–15.0)
MCHC: 34.1 g/dL (ref 30.0–36.0)

## 2011-04-20 LAB — PREGNANCY, URINE: Preg Test, Ur: NEGATIVE

## 2011-04-20 LAB — POCT PREGNANCY, URINE: Preg Test, Ur: NEGATIVE

## 2011-04-20 LAB — GLUCOSE, CAPILLARY
Glucose-Capillary: >600 mg/dL (ref 70–99)
Glucose-Capillary: >600 mg/dL (ref 70–99)

## 2011-04-20 LAB — DIFFERENTIAL
Basophils Relative: 0 % (ref 0–1)
Eosinophils Absolute: 0 10*3/uL (ref 0.0–0.7)
Lymphocytes Relative: 4 % — ABNORMAL LOW (ref 12–46)
Neutro Abs: 17.4 10*3/uL — ABNORMAL HIGH (ref 1.7–7.7)

## 2011-04-20 LAB — URINE MICROSCOPIC-ADD ON

## 2011-04-20 MED ORDER — HYDROMORPHONE HCL PF 1 MG/ML IJ SOLN
0.5000 mg | Freq: Once | INTRAMUSCULAR | Status: AC
  Administered 2011-04-20: 0.5 mg via INTRAVENOUS
  Filled 2011-04-20: qty 1

## 2011-04-20 MED ORDER — SODIUM CHLORIDE 0.9 % IV BOLUS (SEPSIS)
1000.0000 mL | Freq: Once | INTRAVENOUS | Status: AC
  Administered 2011-04-20: 1000 mL via INTRAVENOUS

## 2011-04-20 MED ORDER — LORAZEPAM 2 MG/ML IJ SOLN
1.0000 mg | Freq: Once | INTRAMUSCULAR | Status: AC
  Administered 2011-04-20: 1 mg via INTRAVENOUS
  Filled 2011-04-20: qty 1

## 2011-04-20 MED ORDER — ONDANSETRON HCL 4 MG/2ML IJ SOLN
4.0000 mg | Freq: Once | INTRAMUSCULAR | Status: AC
  Administered 2011-04-20: 4 mg via INTRAVENOUS
  Filled 2011-04-20: qty 2

## 2011-04-20 MED ORDER — DEXTROSE-NACL 5-0.45 % IV SOLN: INTRAVENOUS | Status: DC

## 2011-04-20 MED ORDER — PANTOPRAZOLE SODIUM 40 MG IV SOLR
40.0000 mg | Freq: Once | INTRAVENOUS | Status: AC
  Administered 2011-04-20: 40 mg via INTRAVENOUS
  Filled 2011-04-20: qty 40

## 2011-04-20 MED ORDER — PNEUMOCOCCAL VAC POLYVALENT 25 MCG/0.5ML IJ INJ
0.5000 mL | INJECTION | INTRAMUSCULAR | Status: AC
  Filled 2011-04-20: qty 0.5

## 2011-04-20 MED ORDER — SODIUM CHLORIDE 0.9 % IV SOLN
INTRAVENOUS | Status: DC
  Administered 2011-04-20: 20 mL/h via INTRAVENOUS

## 2011-04-20 MED ORDER — INSULIN REGULAR HUMAN 100 UNIT/ML IJ SOLN
INTRAMUSCULAR | Status: DC
  Administered 2011-04-20: 5.7 [IU]/h via INTRAVENOUS
  Filled 2011-04-20: qty 1

## 2011-04-20 MED ORDER — DEXTROSE 50 % IV SOLN: 25.0000 mL | INTRAVENOUS | Status: DC | PRN

## 2011-04-20 NOTE — ED Notes (Signed)
Called x 2.  RN still unable to take report

## 2011-04-20 NOTE — ED Notes (Signed)
RN on floor in unable to take report at this time.  Will call back

## 2011-04-20 NOTE — Procedures (Signed)
Central Venous Catheter Insertion Procedure Note [name] [id number] 02-20-1981  Procedure: Insertion of Central Venous Catheter Indications: Drug and/or fluid administration  Procedure Details Consent: Risks of procedure as well as the alternatives and risks of each were explained to the (patient/caregiver).  Consent for procedure obtained. Time Out: Verified patient identification, verified procedure, site/side was marked, verified correct patient position, special equipment/implants available, medications/allergies/relevent history reviewed, required imaging and test results available.  Performed  Maximum sterile technique was used including antiseptics, cap, gloves, gown, hand hygiene, mask and sheet. Skin prep: Chlorhexidine; local anesthetic administered A antimicrobial bonded/coated triple lumen catheter was placed in the right internal jugular vein using the Seldinger technique. Ultrasound was used for vessel identification.  Evaluation Blood flow good Complications: No apparent complications Patient did tolerate procedure well. Chest X-ray ordered to verify placement.  CXR: pending.  MCQUAID, DOUGLAS 04/20/2011, 11:41 PM

## 2011-04-20 NOTE — ED Provider Notes (Signed)
History     CSN: [id number]  Arrival date & time 04/20/11  1753   First MD Initiated Contact with Patient 04/20/11 1808      Chief Complaint  Patient presents with  . Hyperglycemia     Patient is a 31 y.o. female presenting with vomiting. The history is provided by the patient.  Emesis  This is a new problem. The current episode started yesterday. The problem occurs 2 to 4 times per day. The problem has been gradually worsening. The emesis has an appearance of stomach contents. Associated symptoms include abdominal pain, chills, diarrhea, a fever and myalgias. Pertinent negatives include no cough.  nothing improves her symptoms Drinking fluids worsens her symptoms  Pt reports vomiting/diarrhea for past 24 hours - nonbloody vomitus/stool She also reports abd pain/cramping,but she reports she has had that previously She reports h/o diabetes and she feels she is in DKA She feels anxious She has insulin pump in place, reports only giving basal rate   Past Medical History  Diagnosis Date  . Diabetes mellitus   . Thyroid disease   . Back pain   . Panic attack     No past surgical history on file.  History reviewed. No pertinent family history.  History  Substance Use Topics  . Smoking status: Former Games developer  . Smokeless tobacco: Former Neurosurgeon  . Alcohol Use: Yes     social    OB History    Grav Para Term Preterm Abortions TAB SAB Ect Mult Living                  Review of Systems  Constitutional: Positive for fever and chills.  Respiratory: Negative for cough.   Gastrointestinal: Positive for vomiting, abdominal pain and diarrhea.  Musculoskeletal: Positive for myalgias.  All other systems reviewed and are negative.    Allergies  Morphine and related  Home Medications  No current outpatient prescriptions on file.  LMP 03/26/2011 BP 121/68  Pulse 134  Temp(Src) 98.6 F (37 C) (Oral)  Resp 20  SpO2 100%  LMP 03/26/2011  Physical Exam CONSTITUTIONAL:  Well developed/well nourished HEAD AND FACE: Normocephalic/atraumatic EYES: EOMI/PERRL, no scleral icterus ENMT: Mucous membranes dry NECK: supple no meningeal signs SPINE:entire spine nontender CV: S1/S2 noted, no murmurs/rubs/gallops noted LUNGS: Lungs are clear to auscultation bilaterally, tachypnea ABDOMEN: soft, nontender, no rebound or guarding, +BS, insulin pump in place GU:no cva tenderness NEURO: Pt is awake/alert, moves all extremitiesx4 EXTREMITIES: pulses normal, full ROM SKIN: warm, color normal PSYCH: anxious  ED Course  Procedures   CRITICAL CARE Performed by: Joya Gaskins   Total critical care time: 45  Critical care time was exclusive of separately billable procedures and treating other patients.  Critical care was necessary to treat or prevent imminent or life-threatening deterioration.  Critical care was time spent personally by me on the following activities: development of treatment plan with patient and/or surrogate as well as nursing, discussions with consultants, evaluation of patient's response to treatment, examination of patient, obtaining history from patient or surrogate, ordering and performing treatments and interventions, ordering and review of laboratory studies, ordering and review of radiographic studies, pulse oximetry and re-evaluation of patient's condition.   Labs Reviewed  GLUCOSE, CAPILLARY - Abnormal; Notable for the following:    Glucose-Capillary >600 (*)    All other components within normal limits  BASIC METABOLIC PANEL  CBC  DIFFERENTIAL  URINALYSIS, ROUTINE W REFLEX MICROSCOPIC  BLOOD GAS, VENOUS   6:48 PM I suspect DKA  at this time She reports she can not increase rate for insulin pump.  Will disengage and likely need insulin drip  8:56 PM DKA noted IV gtt started, pt disengaged her own pump Will call for admission Pt requiring pain meds, though abd soft and no focal tenderness on exam  Pt tachycardic, but not  hypotensive abd soft Lung sounds clear Afebrile here She is able to speak to me comfortably though does have some tachypnea Pt stabilized in the ED D/w dr Jerral Ralph, will admit to stepdown   MDM  Nursing notes reviewed and considered in documentation Previous records reviewed and considered labs/vitals reviewed and considered         Joya Gaskins, MD 04/20/11 2122

## 2011-04-20 NOTE — ED Notes (Signed)
Patient is resting comfortably. 

## 2011-04-20 NOTE — ED Notes (Signed)
Increased insulin drip to 11.7 per glucose stabalizer

## 2011-04-20 NOTE — ED Notes (Signed)
2 unsuccessful attempt at IV start

## 2011-04-20 NOTE — ED Notes (Signed)
NWG:[id number]> Expected date:04/20/11<BR> Expected time:<BR> Means of arrival:Ambulance<BR> Comments:<BR> EMS 33 GC, hyperglycemia w n/v/d

## 2011-04-20 NOTE — H&P (Signed)
PATIENT DETAILS Name: HARRYETTE SHUART Age: 31 y.o. Sex: female Date of Birth: Nov 08, 1980 Admit Date: 04/20/2011 PCP:No primary provider on file.   CHIEF COMPLAINT:  Abdominal pain and vomiting-past 2 days  HPI: Patient is a 31 year old female with a past medical history of insulin-dependent diabetes, anxiety who presents to the ED with the above-noted complaints. 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 The vomitus was nonbloody and nonbilious, she claims she had more than 10-12 vomiting episodes yesterday and a similar number today. She subsequently presented to the ED for further evaluation and was found to have diabetic ketoacidosis. Patient denied having diarrhea to me, she did have some subjective fevers. She denies any cough. She denies any runny nose. Patient has an insulin pump in place, however she has on removed this in the ED. She is currently getting intravenous fluids, a IV insulin infusion is currently being set up by the ED RN. She is now being admitted to the hospitalist service for further continued care and evaluation.   ALLERGIES:   Allergies  Allergen Reactions  . Morphine And Related Itching    PAST MEDICAL HISTORY: Past Medical History  Diagnosis Date  . Diabetes mellitus   . Thyroid disease   . Back pain   . Panic attack   . PONV (postoperative nausea and vomiting)     PAST SURGICAL HISTORY: Past Surgical History  Procedure Date  . Laparoscopy     age 89  . Back surgery     age 17    MEDICATIONS AT HOME: Prior to Admission medications   Medication Sig Start Date End Date Taking? Authorizing Provider  ALPRAZolam Prudy Feeler) 1 MG tablet Take 0.5-1 mg by mouth 3 (three) times daily as needed. For anxiety.   Yes Historical Provider, MD  insulin lispro (HUMALOG) 100 UNIT/ML injection continuous.   Yes Historical Provider, MD  Multiple Vitamin  (MULITIVITAMIN WITH MINERALS) TABS Take 1 tablet by mouth daily.   Yes Historical Provider, MD  traMADol (ULTRAM) 50 MG tablet Take 25-50 mg by mouth every 8 (eight) hours as needed. For pain.   Yes Historical Provider, MD    FAMILY HISTORY: History reviewed. No pertinent family history.  SOCIAL HISTORY:  reports that she quit smoking about 11 years ago. She has quit using smokeless tobacco. She reports that she drinks alcohol. She reports that she does not use illicit drugs.  REVIEW OF SYSTEMS:  Constitutional:   No  weight loss, night sweats, fatigue.  HEENT:    No headaches, Difficulty swallowing,Tooth/dental problems,Sore throat,  No sneezing, itching, ear ache, nasal congestion, post nasal drip,   Cardio-vascular: No chest pain,  Orthopnea, PND, swelling in lower extremities, anasarca, dizziness, palpitations  GI:  No heartburn, indigestion, abdominal pain, nausea, vomiting, diarrhea, change in bowel habits, loss of appetite  Resp:   No excess mucus, no productive cough, No non-productive cough,  No coughing up of blood.No change in color of mucus.No wheezing.No chest wall deformity  Skin:  no rash or lesions.  GU:  no dysuria, change in color of urine, no urgency or frequency.  No flank pain.  Musculoskeletal: No joint pain or swelling.  No decreased range of motion.  No back pain.  Psych: No change in mood or affect. No depression or anxiety.  No memory loss.   PHYSICAL EXAM: Blood pressure 121/68, pulse 134, temperature 98.6 F (37 C), temperature source Oral, resp. rate  20, last menstrual period 03/26/2011, SpO2 100.00%.  General appearance :Awake, alert, not in any distress. Speech is Clear. Not toxic Looking-appear to be slightly tachypneic-but not in obvious distress HEENT: Atraumatic and Normocephalic, pupils equally reactive to light and accomodation Neck: supple, no JVD. No cervical lymphadenopathy.  Chest:Good air entry bilaterally, no added sounds    CVS: S1 S2 regular, no murmurs.  Abdomen: Bowel sounds present, Non tender and not distended with no gaurding, rigidity or rebound. Extremities: B/L Lower Ext shows no edema, both legs are warm to touch, with  dorsalis pedis pulses palpable. Neurology: Awake alert, and oriented X 3, CN II-XII intact, Non focal Skin:No Rash Wounds:N/A  LABS ON ADMISSION:   Basename 04/20/11 1849  NA 129*  K 5.1  CL 82*  CO2 <5*  GLUCOSE 643*  BUN 24*  CREATININE 0.98  CALCIUM 7.5*  MG --  PHOS --   No results found for this basename: AST:2,ALT:2,ALKPHOS:2,BILITOT:2,PROT:2,ALBUMIN:2 in the last 72 hours No results found for this basename: LIPASE:2,AMYLASE:2 in the last 72 hours  Basename 04/20/11 1849  WBC 19.6*  NEUTROABS 17.4*  HGB 14.2  HCT 41.6  MCV 95.0  PLT 188   No results found for this basename: CKTOTAL:3,CKMB:3,CKMBINDEX:3,TROPONINI:3 in the last 72 hours No results found for this basename: DDIMER:2 in the last 72 hours No components found with this basename: POCBNP:3   RADIOLOGIC STUDIES ON ADMISSION: No results found.  ASSESSMENT AND PLAN: Present on Admission:  .DKA (diabetic ketoacidoses)-severe -We'll place on insulin infusion per glucose stabilizer protocol  -I've discussed the case with Dr. Delton Coombes critical-care on-call, I also asked assistance in perhaps placing a central line as the patient has only one small peripheral IV line-patient will need both intravenous insulin infusion and IV fluids running at the same time. We will await PCCM assistance in placing central venous access, in the meantime we will start insulin infusion per glucose stabilizer protocol.  -Hopefully with fluids and insulin infusion her condition will will improve  -Review of prior records does indicate a similar admission in May of last year   Metabolic acidosis -Likely secondary to above -Patient does appear slightly tachypneic-but does not appear uncomfortable or in respiratory distress. She  is easily able to talk in full sentences. -Hopefully with initiation of IV fluids and insulin this will reverse.  .Nausea and vomiting -Likely secondary to viral syndrome -We'll continue with IV fluids, antiemetics as needed and get a abdominal x-ray to make sure there is no concerning features. However her abdomen is soft and currently nontender and nondistended on exam  Leukocytosis -Likely secondary to DKA, she does not have a toxic appearance -Urine analysis is not suggestive of a UTI -We'll get a chest x-ray -We'll continue to monitor and if she is febrile then at that point perhaps start antibiotics  Further plan will depend as patient's clinical course evolves and further radiologic and laboratory data become available. Patient will be monitored closely.   DVT Prophylaxis: Lovenox  Code Status: Full code  Total time spent for admission equals 45 minutes.  Jeoffrey Massed 04/20/2011, 9:44 PM

## 2011-04-20 NOTE — ED Notes (Signed)
EMS report pt c/o nausea vomiting diarrhea, beginning Monday, pt had elevated blood sugar 517 per EMS. Pt reports uses insulin pump at home

## 2011-04-21 LAB — BASIC METABOLIC PANEL
BUN: 24 mg/dL — ABNORMAL HIGH (ref 6–23)
BUN: 24 mg/dL — ABNORMAL HIGH (ref 6–23)
BUN: 23 mg/dL (ref 6–23)
BUN: 21 mg/dL (ref 6–23)
BUN: 21 mg/dL (ref 6–23)
BUN: 19 mg/dL (ref 6–23)
BUN: 16 mg/dL (ref 6–23)
CO2: 8 mEq/L — CL (ref 19–32)
CO2: 18 mEq/L — ABNORMAL LOW (ref 19–32)
CO2: 18 mEq/L — ABNORMAL LOW (ref 19–32)
CO2: 20 mEq/L (ref 19–32)
Calcium: 7.5 mg/dL — ABNORMAL LOW (ref 8.4–10.5)
Calcium: 7.4 mg/dL — ABNORMAL LOW (ref 8.4–10.5)
Calcium: 7.7 mg/dL — ABNORMAL LOW (ref 8.4–10.5)
Calcium: 7.8 mg/dL — ABNORMAL LOW (ref 8.4–10.5)
Calcium: 7.8 mg/dL — ABNORMAL LOW (ref 8.4–10.5)
Calcium: 8.1 mg/dL — ABNORMAL LOW (ref 8.4–10.5)
Chloride: 93 mEq/L — ABNORMAL LOW (ref 96–112)
Chloride: 99 mEq/L (ref 96–112)
Chloride: 98 mEq/L (ref 96–112)
Creatinine, Ser: 1.06 mg/dL (ref 0.50–1.10)
Creatinine, Ser: 0.94 mg/dL (ref 0.50–1.10)
Creatinine, Ser: 0.87 mg/dL (ref 0.50–1.10)
Creatinine, Ser: 0.76 mg/dL (ref 0.50–1.10)
Creatinine, Ser: 0.65 mg/dL (ref 0.50–1.10)
Creatinine, Ser: 0.65 mg/dL (ref 0.50–1.10)
GFR calc Af Amer: 80 mL/min — ABNORMAL LOW (ref >90)
GFR calc Af Amer: >90 mL/min (ref >90)
GFR calc Af Amer: >90 mL/min (ref >90)
GFR calc Af Amer: >90 mL/min (ref >90)
GFR calc non Af Amer: 69 mL/min — ABNORMAL LOW (ref >90)
GFR calc non Af Amer: 88 mL/min — ABNORMAL LOW (ref >90)
GFR calc non Af Amer: >90 mL/min (ref >90)
GFR calc non Af Amer: >90 mL/min (ref >90)
GFR calc non Af Amer: >90 mL/min (ref >90)
Glucose, Bld: 186 mg/dL — ABNORMAL HIGH (ref 70–99)
Glucose, Bld: 125 mg/dL — ABNORMAL HIGH (ref 70–99)
Glucose, Bld: 161 mg/dL — ABNORMAL HIGH (ref 70–99)
Glucose, Bld: 159 mg/dL — ABNORMAL HIGH (ref 70–99)
Potassium: 3.5 mEq/L (ref 3.5–5.1)
Potassium: 3.4 mEq/L — ABNORMAL LOW (ref 3.5–5.1)
Potassium: 3.2 mEq/L — ABNORMAL LOW (ref 3.5–5.1)
Potassium: 2.9 mEq/L — ABNORMAL LOW (ref 3.5–5.1)
Sodium: 134 mEq/L — ABNORMAL LOW (ref 135–145)
Sodium: 132 mEq/L — ABNORMAL LOW (ref 135–145)
Sodium: 129 mEq/L — ABNORMAL LOW (ref 135–145)
Sodium: 128 mEq/L — ABNORMAL LOW (ref 135–145)

## 2011-04-21 LAB — GLUCOSE, CAPILLARY
Glucose-Capillary: 174 mg/dL — ABNORMAL HIGH (ref 70–99)
Glucose-Capillary: 128 mg/dL — ABNORMAL HIGH (ref 70–99)
Glucose-Capillary: 124 mg/dL — ABNORMAL HIGH (ref 70–99)
Glucose-Capillary: 137 mg/dL — ABNORMAL HIGH (ref 70–99)
Glucose-Capillary: 152 mg/dL — ABNORMAL HIGH (ref 70–99)
Glucose-Capillary: 101 mg/dL — ABNORMAL HIGH (ref 70–99)
Glucose-Capillary: 178 mg/dL — ABNORMAL HIGH (ref 70–99)
Glucose-Capillary: 148 mg/dL — ABNORMAL HIGH (ref 70–99)
Glucose-Capillary: 171 mg/dL — ABNORMAL HIGH (ref 70–99)
Glucose-Capillary: 137 mg/dL — ABNORMAL HIGH (ref 70–99)
Glucose-Capillary: 145 mg/dL — ABNORMAL HIGH (ref 70–99)

## 2011-04-21 MED ORDER — INSULIN ASPART 100 UNIT/ML ~~LOC~~ SOLN
0.0000 [IU] | SUBCUTANEOUS | Status: DC
  Filled 2011-04-21: qty 3

## 2011-04-21 MED ORDER — RANITIDINE HCL 150 MG/10ML PO SYRP
150.0000 mg | ORAL_SOLUTION | Freq: Once | ORAL | Status: AC
  Administered 2011-04-21: 150 mg via ORAL
  Filled 2011-04-21: qty 10

## 2011-04-21 MED ORDER — SODIUM CHLORIDE 0.9 % IV SOLN: INTRAVENOUS | Status: AC

## 2011-04-21 MED ORDER — ENOXAPARIN SODIUM 40 MG/0.4ML ~~LOC~~ SOLN
40.0000 mg | SUBCUTANEOUS | Status: DC
  Administered 2011-04-21 – 2011-04-23 (×3): 40 mg via SUBCUTANEOUS
  Filled 2011-04-21 (×5): qty 0.4

## 2011-04-21 MED ORDER — DEXTROSE-NACL 5-0.45 % IV SOLN
INTRAVENOUS | Status: DC
  Administered 2011-04-21: 100 mL/h via INTRAVENOUS
  Administered 2011-04-21 – 2011-04-22 (×3): via INTRAVENOUS

## 2011-04-21 MED ORDER — INSULIN ASPART 100 UNIT/ML ~~LOC~~ SOLN: 5.0000 [IU] | SUBCUTANEOUS | Status: DC

## 2011-04-21 MED ORDER — HYDROMORPHONE HCL PF 1 MG/ML IJ SOLN
0.5000 mg | INTRAMUSCULAR | Status: DC | PRN
  Administered 2011-04-21: 08:00:00 via INTRAVENOUS
  Administered 2011-04-21: 0.5 mg via INTRAVENOUS
  Filled 2011-04-21 (×3): qty 1

## 2011-04-21 MED ORDER — POTASSIUM CHLORIDE 10 MEQ/100ML IV SOLN
10.0000 meq | INTRAVENOUS | Status: AC
  Administered 2011-04-21 – 2011-04-22 (×3): 10 meq via INTRAVENOUS

## 2011-04-21 MED ORDER — ONDANSETRON HCL 4 MG/2ML IJ SOLN
4.0000 mg | Freq: Four times a day (QID) | INTRAMUSCULAR | Status: DC | PRN
  Administered 2011-04-21 – 2011-04-23 (×7): 4 mg via INTRAVENOUS
  Filled 2011-04-21 (×7): qty 2

## 2011-04-21 MED ORDER — SODIUM CHLORIDE 0.9 % IV SOLN
INTRAVENOUS | Status: DC
  Administered 2011-04-21: 150 mL/h via INTRAVENOUS
  Administered 2011-04-22 – 2011-04-23 (×5): via INTRAVENOUS

## 2011-04-21 MED ORDER — POTASSIUM CHLORIDE 10 MEQ/100ML IV SOLN
INTRAVENOUS | Status: AC
  Administered 2011-04-22: 10 meq
  Filled 2011-04-21: qty 300

## 2011-04-21 MED ORDER — INSULIN ASPART 100 UNIT/ML ~~LOC~~ SOLN
0.0000 [IU] | SUBCUTANEOUS | Status: DC
  Administered 2011-04-21: 1 [IU] via SUBCUTANEOUS
  Administered 2011-04-22: 3 [IU] via SUBCUTANEOUS
  Administered 2011-04-22: 1 [IU] via SUBCUTANEOUS
  Filled 2011-04-21 (×2): qty 3

## 2011-04-21 MED ORDER — INSULIN GLARGINE 100 UNIT/ML ~~LOC~~ SOLN
10.0000 [IU] | Freq: Once | SUBCUTANEOUS | Status: AC
  Administered 2011-04-21: 10 [IU] via SUBCUTANEOUS
  Filled 2011-04-21: qty 3

## 2011-04-21 MED ORDER — DEXTROSE 50 % IV SOLN: 25.0000 mL | INTRAVENOUS | Status: DC | PRN

## 2011-04-21 MED ORDER — ALPRAZOLAM 0.5 MG PO TABS
0.5000 mg | ORAL_TABLET | Freq: Three times a day (TID) | ORAL | Status: DC | PRN
  Administered 2011-04-21 – 2011-04-22 (×3): 0.5 mg via ORAL
  Filled 2011-04-21 (×4): qty 1

## 2011-04-21 MED ORDER — PANTOPRAZOLE SODIUM 40 MG IV SOLR
40.0000 mg | Freq: Every day | INTRAVENOUS | Status: DC
  Administered 2011-04-21 – 2011-04-22 (×2): 40 mg via INTRAVENOUS
  Filled 2011-04-21 (×4): qty 40

## 2011-04-21 MED ORDER — SODIUM CHLORIDE 0.9 % IV SOLN
INTRAVENOUS | Status: DC
  Administered 2011-04-21: 5.7 [IU]/h via INTRAVENOUS
  Administered 2011-04-21: 4.1 [IU]/h via INTRAVENOUS
  Administered 2011-04-21: 5.3 [IU]/h via INTRAVENOUS
  Filled 2011-04-21: qty 1

## 2011-04-21 MED ORDER — HYDROMORPHONE HCL PF 1 MG/ML IJ SOLN
1.0000 mg | INTRAMUSCULAR | Status: DC | PRN
  Administered 2011-04-21 – 2011-04-22 (×5): 1 mg via INTRAVENOUS
  Filled 2011-04-21 (×6): qty 1

## 2011-04-21 NOTE — Plan of Care (Signed)
Remains in ICU stepdown on insulin drip. D5 1/2NS infusing since 0200 when BS <250 mg/dl. Diabetic teaching provided by diabetes educator. Patient cognizant of certain aspects of managing diabetes. When to call the MD and how to prevent DKA  reviewed. Anion gap decreasing, but remains in metabolic acidosis. BMET pending.

## 2011-04-21 NOTE — Progress Notes (Signed)
Inpatient Diabetes Program Recommendations  AACE/ADA: New Consensus Statement on Inpatient Glycemic Control (2009)  Target Ranges:  Prepandial:   less than 140 mg/dL      Peak postprandial:   less than 180 mg/dL (1-2 hours)      Critically ill patients:  140 - 180 mg/dL   Reason for Visit: Hyperglycemia / DKA  Results for [name], [name] (MRN [id number]) as of 04/21/2011 16:10  Ref. Range 04/21/2011 04:06 04/21/2011 05:00 04/21/2011 06:00 04/21/2011 07:01 04/21/2011 08:12 04/21/2011 09:28 04/21/2011 10:31 04/21/2011 11:27 04/21/2011 12:21 04/21/2011 13:39 04/21/2011 15:03  Glucose-Capillary Latest Range: 70-99 mg/dL 914 (H) 782 (H) 956 (H) 128 (H) 111 (H) 119 (H) 124 (H) 137 (H) 175 (H) 152 (H) 101 (H)    Inpatient Diabetes Program Recommendations Insulin - Basal: Give basal insulin 1 hour prior to discontinuation of GlucoStabilizer.  Recommend Lantus 10 units. Correction (SSI): Novolog sensitive tidwc and hs - when GlucoStabilizer has been discontinued and pt is on CHO-mod diet Insulin - Meal Coverage: Novolog 3 units tidwc Diet: When advanced, CHO-mod med  Note: When anion gap is closed, give basal insulin and continue insulin gtt. For 1 hour before discontinuation.  Discussed importance of calling endocrinologist when blood sugars increase to > 250 mg/dL to receive instructions for insulin pump at home.  Uses Omnipod pump and states her basal rates are 0.5 - 0.6 units per hour.  CHO ratio is 1:20 and could not remember CF.  Took pump off prior to hospitalization.  Will follow.  Discussed with RN.

## 2011-04-21 NOTE — Progress Notes (Signed)
Subjective: C/o heartburn and continued abdominal pain, altho improved from admission.  Objective: Vital signs in last 24 hours: Temp:  [98.1 F (36.7 C)-99.7 F (37.6 C)] 99.5 F (37.5 C) (02/27 0800) Pulse Rate:  [122-146] 123  (02/27 0400) Resp:  [16-28] 16  (02/27 0400) BP: (96-121)/(53-86) 96/53 mmHg (02/27 0400) SpO2:  [98 %-100 %] 100 % (02/27 0400) Weight:  [49.3 kg (108 lb 11 oz)] 49.3 kg (108 lb 11 oz) (02/27 0010) Weight change:     Intake/Output from previous day: 02/26 0701 - 02/27 0700 In: 3088.6 [P.O.:240; I.V.:2848.6] Out: 950 [Urine:950]     Physical Exam: General: Alert, awake, oriented x3. HEENT: No bruits, no goiter. Heart: Regular rate and rhythm, without murmurs, rubs, gallops. Lungs: Clear to auscultation bilaterally. Abdomen: Soft, nontender, nondistended, positive bowel sounds. Extremities: No clubbing cyanosis or edema with positive pedal pulses. Neuro: Grossly intact, nonfocal.    Lab Results: Basic Metabolic Panel:  Basename 04/21/11 0651 04/21/11 0429  NA 132* 134*  K 3.5 3.8  CL 102 102  CO2 15* 11*  GLUCOSE 148* 186*  BUN 21 23  CREATININE 0.76 0.87  CALCIUM 7.7* 7.4*  MG -- --  PHOS -- --   CBC:  Basename 04/20/11 1849  WBC 19.6*  NEUTROABS 17.4*  HGB 14.2  HCT 41.6  MCV 95.0  PLT 188   CBG:  Basename 04/21/11 0812 04/21/11 0701 04/21/11 0600 04/21/11 0500 04/21/11 0406 04/21/11 0303  GLUCAP 111* 128* 174* 189* 207* 197*   Coagulation:  Basename 04/20/11 2300  LABPROT 16.3*  INR 1.29   Urine Drug Screen: Drugs of Abuse     Component Value Date/Time   LABOPIA NEGATIVE 06/11/2010 0327   COCAINSCRNUR NEGATIVE 06/11/2010 0327   LABBENZ NEGATIVE 06/11/2010 0327   AMPHETMU NEGATIVE 06/11/2010 0327    Urinalysis:  Basename 04/20/11 1932  COLORURINE YELLOW  LABSPEC 1.025  PHURINE 5.5  GLUCOSEU >1000*  HGBUR SMALL*  BILIRUBINUR NEGATIVE  KETONESUR >80*  PROTEINUR NEGATIVE  UROBILINOGEN 0.2  NITRITE NEGATIVE   LEUKOCYTESUR NEGATIVE    Recent Results (from the past 240 hour(s))  MRSA PCR SCREENING     Status: Normal   Collection Time   04/21/11 12:14 AM      Component Value Range Status Comment   MRSA by PCR NEGATIVE  NEGATIVE  Final     Studies/Results: Dg Chest Port 1 View  04/21/2011  *RADIOLOGY REPORT*  Clinical Data: Central line placement.  PORTABLE CHEST - 1 VIEW  Comparison: Chest radiograph performed 06/08/2007  Findings: The patient's right IJ line is noted ending about the distal SVC.  The lungs are well-aerated and clear.  There is no evidence of focal opacification, pleural effusion or pneumothorax.  Pulmonary vascularity is at the upper limits of normal.  The cardiomediastinal silhouette is within normal limits.  No acute osseous abnormalities are seen.  IMPRESSION:  1.  Right IJ line noted ending about the distal SVC. 2.  No acute cardiopulmonary process seen.  Original Report Authenticated By: Tonia Ghent, M.D.   Dg Abd Acute W/chest  04/20/2011  *RADIOLOGY REPORT*  Clinical Data: Abdominal pain.  ACUTE ABDOMEN SERIES (ABDOMEN 2 VIEW & CHEST 1 VIEW)  Comparison: Chest radiograph performed 04/16 1009, and CT of the chest, abdomen and pelvis performed 02/04/2007  Findings: The lungs are well-aerated and clear.  There is no evidence of focal opacification, pleural effusion or pneumothorax. The cardiomediastinal silhouette is within normal limits. Scattered clips are noted about the superior mediastinum.  There is a relative paucity of bowel gas within the abdomen. Visualized small and large bowel loops remain grossly normal in caliber; there is no evidence of small bowel dilatation to suggest obstruction.  No free intra-abdominal air is identified on the provided upright view.  Relatively stable hepatomegaly is noted.  No acute osseous abnormalities are seen; the sacroiliac joints are unremarkable in appearance.  IMPRESSION:  1.  Unremarkable bowel gas pattern; no free intra-abdominal air  seen.  Paucity of bowel gas within the abdomen. 2.  No acute cardiopulmonary process identified. 3.  Relatively stable hepatomegaly noted.  Original Report Authenticated By: Tonia Ghent, M.D.    Medications: Scheduled Meds:   . enoxaparin  40 mg Subcutaneous Q24H  .  HYDROmorphone (DILAUDID) injection  0.5 mg Intravenous Once  . LORazepam  1 mg Intravenous Once  . ondansetron  4 mg Intravenous Once  . pantoprazole (PROTONIX) IV  40 mg Intravenous Once  . pantoprazole (PROTONIX) IV  40 mg Intravenous QHS  . pneumococcal 23 valent vaccine  0.5 mL Intramuscular Tomorrow-1000  . ranitidine  150 mg Oral Once  . sodium chloride  1,000 mL Intravenous Once   Continuous Infusions:   . sodium chloride    . sodium chloride 150 mL/hr (04/21/11 0031)  . dextrose 5 % and 0.45% NaCl 100 mL/hr (04/21/11 0220)  . insulin (NOVOLIN-R) infusion 1.5 Units/hr (04/21/11 0827)  . DISCONTD: sodium chloride 20 mL/hr (04/20/11 2243)  . DISCONTD: dextrose 5 % and 0.45% NaCl    . DISCONTD: insulin (NOVOLIN-R) infusion 5.7 Units/hr (04/20/11 2205)   PRN Meds:.ALPRAZolam, dextrose, HYDROmorphone (DILAUDID) injection, ondansetron (ZOFRAN) IV, DISCONTD: dextrose, DISCONTD:  HYDROmorphone (DILAUDID) injection  Assessment/Plan:  Principal Problem:  *DKA (diabetic ketoacidoses) Active Problems:  Nausea and vomiting   #1 DKA: CBGs improving, however on latest BMET still has a gap of 15 and a bicarb of 15, so will keep on IV insulin drip for now. Continue IVF, add D5, continue q4hour BMETs.  #2  N/V/Abd pain: Likely viral gastroenteritis. Symptomatic management.  #3 Dispo: keep in SDU today.   LOS: 1 day   [facility] Pager: [phone] 04/21/2011, 9:19 AM

## 2011-04-21 NOTE — Progress Notes (Signed)
UR complete 

## 2011-04-22 LAB — BASIC METABOLIC PANEL
BUN: 10 mg/dL (ref 6–23)
CO2: 20 mEq/L (ref 19–32)
Chloride: 96 mEq/L (ref 96–112)
Chloride: 97 mEq/L (ref 96–112)
GFR calc Af Amer: >90 mL/min (ref >90)
GFR calc non Af Amer: >90 mL/min (ref >90)
Glucose, Bld: 158 mg/dL — ABNORMAL HIGH (ref 70–99)
Potassium: 3.1 mEq/L — ABNORMAL LOW (ref 3.5–5.1)
Potassium: 3.4 mEq/L — ABNORMAL LOW (ref 3.5–5.1)
Sodium: 127 mEq/L — ABNORMAL LOW (ref 135–145)

## 2011-04-22 LAB — GLUCOSE, CAPILLARY
Glucose-Capillary: 218 mg/dL — ABNORMAL HIGH (ref 70–99)
Glucose-Capillary: 138 mg/dL — ABNORMAL HIGH (ref 70–99)
Glucose-Capillary: 222 mg/dL — ABNORMAL HIGH (ref 70–99)
Glucose-Capillary: 440 mg/dL — ABNORMAL HIGH (ref 70–99)

## 2011-04-22 MED ORDER — INSULIN ASPART 100 UNIT/ML ~~LOC~~ SOLN
4.0000 [IU] | Freq: Three times a day (TID) | SUBCUTANEOUS | Status: DC
  Administered 2011-04-22 – 2011-04-23 (×3): 4 [IU] via SUBCUTANEOUS
  Filled 2011-04-22: qty 3

## 2011-04-22 MED ORDER — INSULIN ASPART 100 UNIT/ML ~~LOC~~ SOLN
0.0000 [IU] | Freq: Three times a day (TID) | SUBCUTANEOUS | Status: DC
  Administered 2011-04-22 (×2): 5 [IU] via SUBCUTANEOUS
  Administered 2011-04-23: 11 [IU] via SUBCUTANEOUS
  Administered 2011-04-23: 15 [IU] via SUBCUTANEOUS
  Filled 2011-04-22: qty 3

## 2011-04-22 MED ORDER — INSULIN GLARGINE 100 UNIT/ML ~~LOC~~ SOLN: 15.0000 [IU] | Freq: Every day | SUBCUTANEOUS | Status: DC

## 2011-04-22 MED ORDER — INSULIN GLARGINE 100 UNIT/ML ~~LOC~~ SOLN
15.0000 [IU] | Freq: Every day | SUBCUTANEOUS | Status: DC
  Administered 2011-04-22 – 2011-04-23 (×2): 15 [IU] via SUBCUTANEOUS

## 2011-04-22 MED ORDER — OXYCODONE HCL 5 MG PO TABS
5.0000 mg | ORAL_TABLET | ORAL | Status: DC | PRN
  Administered 2011-04-22 – 2011-04-23 (×5): 5 mg via ORAL
  Filled 2011-04-22 (×5): qty 1

## 2011-04-22 MED ORDER — HYDROMORPHONE HCL PF 1 MG/ML IJ SOLN
0.5000 mg | INTRAMUSCULAR | Status: DC | PRN
  Administered 2011-04-22: 20:00:00 via INTRAVENOUS
  Administered 2011-04-22 – 2011-04-23 (×7): 0.5 mg via INTRAVENOUS
  Filled 2011-04-22 (×8): qty 1

## 2011-04-22 NOTE — Progress Notes (Signed)
Results for CONSUELLO, LASSALLE (MRN [id number]) as of 04/22/2011 15:48  Ref. Range 04/21/2011 18:58 04/21/2011 20:01 04/21/2011 21:09 04/21/2011 23:23 04/22/2011 11:52  Glucose-Capillary Latest Range: 70-99 mg/dL 409 (H) 811 (H) 914 (H) 145 (H) 202 (H)   Pt states family is bring insulin pump and supplies this afternoon.  Has Omnipod pump and appears knowledgeable of pump and settings.  Discussed insulin pump policy with ICU RN and patient and she verbalized understanding.  Pt to use hospital meter instead of home glucose meter when making adjustments.  Diet has been advanced.  Pt transferred to floor.

## 2011-04-22 NOTE — Progress Notes (Signed)
Subjective: Feels better today.  Objective: Vital signs in last 24 hours: Temp:  [98.4 F (36.9 C)-99.2 F (37.3 C)] 98.4 F (36.9 C) (02/28 0800) Pulse Rate:  [74-106] 74  (02/28 0400) Resp:  [9-21] 10  (02/28 0400) BP: (85-111)/(49-87) 100/60 mmHg (02/28 0800) SpO2:  [98 %-100 %] 100 % (02/28 0400) Weight change:     Intake/Output from previous day: 02/27 0701 - 02/28 0700 In: 3317.8 [P.O.:600; I.V.:2511.8; IV Piggyback:206] Out: 1000 [Urine:1000] Total I/O In: 200 [I.V.:200] Out: 400 [Urine:400]   Physical Exam: General: Alert, awake, oriented x3, in no acute distress. HEENT: No bruits, no goiter. Heart: Regular rate and rhythm, without murmurs, rubs, gallops. Lungs: Clear to auscultation bilaterally. Abdomen: Soft, nontender, nondistended, positive bowel sounds. Extremities: No clubbing cyanosis or edema with positive pedal pulses. Neuro: Grossly intact, nonfocal.    Lab Results: Basic Metabolic Panel:  Basename 04/22/11 0400 04/21/11 2348  NA 126* 127*  K 3.4* 3.1*  CL 97 96  CO2 21 20  GLUCOSE 220* 158*  BUN 10 13  CREATININE 0.56 0.59  CALCIUM 8.2* 7.9*  MG -- --  PHOS -- --   CBC:  Basename 04/20/11 1849  WBC 19.6*  NEUTROABS 17.4*  HGB 14.2  HCT 41.6  MCV 95.0  PLT 188   CBG:  Basename 04/21/11 2323 04/21/11 2109 04/21/11 2001 04/21/11 1858 04/21/11 1756 04/21/11 1654  GLUCAP 145* 137* 171* 148* 178* 215*   Coagulation:  Basename 04/20/11 2300  LABPROT 16.3*  INR 1.29   Urine Drug Screen: Drugs of Abuse     Component Value Date/Time   LABOPIA NEGATIVE 06/11/2010 0327   COCAINSCRNUR NEGATIVE 06/11/2010 0327   LABBENZ NEGATIVE 06/11/2010 0327   AMPHETMU NEGATIVE 06/11/2010 0327    Urinalysis:  Basename 04/20/11 1932  COLORURINE YELLOW  LABSPEC 1.025  PHURINE 5.5  GLUCOSEU >1000*  HGBUR SMALL*  BILIRUBINUR NEGATIVE  KETONESUR >80*  PROTEINUR NEGATIVE  UROBILINOGEN 0.2  NITRITE NEGATIVE  LEUKOCYTESUR NEGATIVE    Recent  Results (from the past 240 hour(s))  MRSA PCR SCREENING     Status: Normal   Collection Time   04/21/11 12:14 AM      Component Value Range Status Comment   MRSA by PCR NEGATIVE  NEGATIVE  Final     Studies/Results: Dg Chest Port 1 View  04/21/2011  *RADIOLOGY REPORT*  Clinical Data: Central line placement.  PORTABLE CHEST - 1 VIEW  Comparison: Chest radiograph performed 06/08/2007  Findings: The patient's right IJ line is noted ending about the distal SVC.  The lungs are well-aerated and clear.  There is no evidence of focal opacification, pleural effusion or pneumothorax.  Pulmonary vascularity is at the upper limits of normal.  The cardiomediastinal silhouette is within normal limits.  No acute osseous abnormalities are seen.  IMPRESSION:  1.  Right IJ line noted ending about the distal SVC. 2.  No acute cardiopulmonary process seen.  Original Report Authenticated By: Tonia Ghent, M.D.   Dg Abd Acute W/chest  04/20/2011  *RADIOLOGY REPORT*  Clinical Data: Abdominal pain.  ACUTE ABDOMEN SERIES (ABDOMEN 2 VIEW & CHEST 1 VIEW)  Comparison: Chest radiograph performed 04/16 1009, and CT of the chest, abdomen and pelvis performed 02/04/2007  Findings: The lungs are well-aerated and clear.  There is no evidence of focal opacification, pleural effusion or pneumothorax. The cardiomediastinal silhouette is within normal limits. Scattered clips are noted about the superior mediastinum.  There is a relative paucity of bowel gas within the abdomen.  Visualized small and large bowel loops remain grossly normal in caliber; there is no evidence of small bowel dilatation to suggest obstruction.  No free intra-abdominal air is identified on the provided upright view.  Relatively stable hepatomegaly is noted.  No acute osseous abnormalities are seen; the sacroiliac joints are unremarkable in appearance.  IMPRESSION:  1.  Unremarkable bowel gas pattern; no free intra-abdominal air seen.  Paucity of bowel gas within the  abdomen. 2.  No acute cardiopulmonary process identified. 3.  Relatively stable hepatomegaly noted.  Original Report Authenticated By: Tonia Ghent, M.D.    Medications: Scheduled Meds:   . enoxaparin  40 mg Subcutaneous Q24H  . insulin aspart  0-15 Units Subcutaneous TID WC  . insulin aspart  4 Units Subcutaneous TID WC  . insulin glargine  10 Units Subcutaneous Once  . insulin glargine  15 Units Subcutaneous QHS  . pantoprazole (PROTONIX) IV  40 mg Intravenous QHS  . pneumococcal 23 valent vaccine  0.5 mL Intramuscular Tomorrow-1000  . potassium chloride  10 mEq Intravenous Q1 Hr x 3  . potassium chloride      . DISCONTD: insulin aspart  0-15 Units Subcutaneous Q4H  . DISCONTD: insulin aspart  0-9 Units Subcutaneous Q4H  . DISCONTD: insulin aspart  5 Units Subcutaneous Q4H   Continuous Infusions:   . sodium chloride 150 mL/hr (04/21/11 0031)  . DISCONTD: dextrose 5 % and 0.45% NaCl 100 mL/hr at 04/22/11 0734  . DISCONTD: insulin (NOVOLIN-R) infusion Stopped (04/21/11 2200)   PRN Meds:.ALPRAZolam, dextrose, ondansetron (ZOFRAN) IV, oxyCODONE, DISCONTD:  HYDROmorphone (DILAUDID) injection  Assessment/Plan:  Principal Problem:  *DKA (diabetic ketoacidoses) Active Problems:  Nausea and vomiting   #1 DKA: resolved. Transition over to lantus/SSI until she can get her pump supplies to the hospital. Transfer to the floor. Diabetes coordinator to assist with retrieving pump settings. Likely  DC home in am.   LOS: 2 days   [facility] Pager: [phone] 04/22/2011, 9:47 AM

## 2011-04-23 MED ORDER — ONDANSETRON HCL 4 MG PO TABS: 4.0000 mg | ORAL_TABLET | Freq: Three times a day (TID) | ORAL | Status: DC | PRN

## 2011-04-23 MED ORDER — OXYCODONE HCL 5 MG PO TABS: 5.0000 mg | ORAL_TABLET | ORAL | Status: AC | PRN

## 2011-04-23 NOTE — Progress Notes (Signed)
Results for OKEMA, ROLLINSON (MRN [id number]) as of 04/23/2011 13:59  Ref. Range 04/22/2011 11:52 04/22/2011 16:54 04/22/2011 20:58 04/23/2011 07:36 04/23/2011 13:20  Glucose-Capillary Latest Range: 70-99 mg/dL 119 (H) 147 (H) 829 (H) 347 (H) 372 (H)   Pt still does not have insulin pump from home.  Would increase Lantus to 18 units QDAC and increase Novolog to 6 units tidwc.  If family brings pump, pt to resume previous settings and call Dr. Sharl Ma immediately for appointment to f/u regarding her glycemic control.  May benefit from endo consult while here in hospital.

## 2011-04-23 NOTE — Discharge Summary (Signed)
Physician Discharge Summary  Patient ID: [name] MRN: [id number] DOB/AGE: 03-17-1980 30 y.o.  Admit date: 04/20/2011 Discharge date: 04/23/2011  Primary Care Physician:  No primary provider on file.   Discharge Diagnoses:    Principal Problem:  *DKA (diabetic ketoacidoses) Active Problems:  Nausea and vomiting    Medication List  As of 04/23/2011  3:30 PM   TAKE these medications         ALPRAZolam 1 MG tablet   Commonly known as: XANAX   Take 0.5-1 mg by mouth 3 (three) times daily as needed. For anxiety.      HUMALOG 100 UNIT/ML injection   Generic drug: insulin lispro   continuous.      mulitivitamin with minerals Tabs   Take 1 tablet by mouth daily.      ondansetron 4 MG tablet   Commonly known as: ZOFRAN   Take 1 tablet (4 mg total) by mouth every 8 (eight) hours as needed for nausea.      oxyCODONE 5 MG immediate release tablet   Commonly known as: Oxy IR/ROXICODONE   Take 1 tablet (5 mg total) by mouth every 4 (four) hours as needed.      traMADol 50 MG tablet   Commonly known as: ULTRAM   Take 25-50 mg by mouth every 8 (eight) hours as needed. For pain.             Disposition and Follow-up:  To be discharged home today in stable and improved condition. Will need to followup with her endocrinologist early next week for followup on pump settings.  Consults:  None    Significant Diagnostic Studies:  Dg Chest Port 1 View  04/21/2011  *RADIOLOGY REPORT*  Clinical Data: Central line placement.  PORTABLE CHEST - 1 VIEW  Comparison: Chest radiograph performed 06/08/2007  Findings: The patient's right IJ line is noted ending about the distal SVC.  The lungs are well-aerated and clear.  There is no evidence of focal opacification, pleural effusion or pneumothorax.  Pulmonary vascularity is at the upper limits of normal.  The cardiomediastinal silhouette is within normal limits.  No acute osseous abnormalities are seen.  IMPRESSION:  1.  Right IJ line  noted ending about the distal SVC. 2.  No acute cardiopulmonary process seen.  Original Report Authenticated By: Tonia Ghent, M.D.   Dg Abd Acute W/chest  04/20/2011  *RADIOLOGY REPORT*  Clinical Data: Abdominal pain.  ACUTE ABDOMEN SERIES (ABDOMEN 2 VIEW & CHEST 1 VIEW)  Comparison: Chest radiograph performed 04/16 1009, and CT of the chest, abdomen and pelvis performed 02/04/2007  Findings: The lungs are well-aerated and clear.  There is no evidence of focal opacification, pleural effusion or pneumothorax. The cardiomediastinal silhouette is within normal limits. Scattered clips are noted about the superior mediastinum.  There is a relative paucity of bowel gas within the abdomen. Visualized small and large bowel loops remain grossly normal in caliber; there is no evidence of small bowel dilatation to suggest obstruction.  No free intra-abdominal air is identified on the provided upright view.  Relatively stable hepatomegaly is noted.  No acute osseous abnormalities are seen; the sacroiliac joints are unremarkable in appearance.  IMPRESSION:  1.  Unremarkable bowel gas pattern; no free intra-abdominal air seen.  Paucity of bowel gas within the abdomen. 2.  No acute cardiopulmonary process identified. 3.  Relatively stable hepatomegaly noted.  Original Report Authenticated By: Tonia Ghent, M.D.    Brief H and P: For complete details please  refer to admission H and P, but in brief patient is a 31 year old female with a past medical history of insulin-dependent diabetes, anxiety who presents to the ED with abdominal pain and vomiting. 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 The vomitus was nonbloody and nonbilious, she claims she had more than 10-12 vomiting episodes yesterday and a similar number today. She subsequently presented to the ED for further evaluation and was found to have diabetic  ketoacidosis. We are asked to admit her for further evaluation and management.     Hospital Course:  Principal Problem:  *DKA (diabetic ketoacidoses) Active Problems:  Nausea and vomiting   #1 DKA: Resolved. Her family has still not brought her pump and pump supplies. She is otherwise stable for DC home. She is proficient and competent with her pump settings and has been instructed to followup with Dr. Sharl Ma early next week. She is requesting a short-term prescription for zofran and oxycodone in case she needs it over the weekend, which I have given to her.  Time spent on Discharge: Greater than 30 minutes  Signed: Chaya Jan [facility] Pager: [phone] 04/23/2011, 3:30 PM

## 2011-04-30 ENCOUNTER — Other Ambulatory Visit: Payer: Self-pay

## 2011-04-30 ENCOUNTER — Emergency Department (HOSPITAL_COMMUNITY): Payer: Medicaid Other

## 2011-04-30 ENCOUNTER — Inpatient Hospital Stay (HOSPITAL_COMMUNITY)
Admission: EM | Admit: 2011-04-30 | Discharge: 2011-05-04 | DRG: 637 | Disposition: A | Discharge status: Home / Self Care | Payer: Medicaid Other | Attending: Pulmonary Disease | Admitting: Pulmonary Disease

## 2011-04-30 ENCOUNTER — Encounter (HOSPITAL_COMMUNITY): Payer: Self-pay

## 2011-04-30 ENCOUNTER — Inpatient Hospital Stay (HOSPITAL_COMMUNITY): Payer: Medicaid Other

## 2011-04-30 DIAGNOSIS — E1110 Type 2 diabetes mellitus with ketoacidosis without coma: Secondary | ICD-10-CM

## 2011-04-30 DIAGNOSIS — R4182 Altered mental status, unspecified: Secondary | ICD-10-CM | POA: Diagnosis present

## 2011-04-30 DIAGNOSIS — E876 Hypokalemia: Secondary | ICD-10-CM | POA: Diagnosis not present

## 2011-04-30 DIAGNOSIS — N179 Acute kidney failure, unspecified: Secondary | ICD-10-CM | POA: Diagnosis present

## 2011-04-30 DIAGNOSIS — E875 Hyperkalemia: Secondary | ICD-10-CM

## 2011-04-30 DIAGNOSIS — R7401 Elevation of levels of liver transaminase levels: Secondary | ICD-10-CM | POA: Diagnosis present

## 2011-04-30 DIAGNOSIS — T438X5A Adverse effect of other psychotropic drugs, initial encounter: Secondary | ICD-10-CM | POA: Diagnosis present

## 2011-04-30 DIAGNOSIS — F411 Generalized anxiety disorder: Secondary | ICD-10-CM | POA: Diagnosis present

## 2011-04-30 DIAGNOSIS — Z91199 Patient's noncompliance with other medical treatment and regimen due to unspecified reason: Secondary | ICD-10-CM

## 2011-04-30 DIAGNOSIS — Z794 Long term (current) use of insulin: Secondary | ICD-10-CM

## 2011-04-30 DIAGNOSIS — Z9119 Patient's noncompliance with other medical treatment and regimen: Secondary | ICD-10-CM

## 2011-04-30 DIAGNOSIS — E1010 Type 1 diabetes mellitus with ketoacidosis without coma: Secondary | ICD-10-CM

## 2011-04-30 DIAGNOSIS — G9340 Encephalopathy, unspecified: Secondary | ICD-10-CM

## 2011-04-30 DIAGNOSIS — D473 Essential (hemorrhagic) thrombocythemia: Secondary | ICD-10-CM | POA: Diagnosis present

## 2011-04-30 DIAGNOSIS — D72829 Elevated white blood cell count, unspecified: Secondary | ICD-10-CM | POA: Diagnosis present

## 2011-04-30 DIAGNOSIS — G92 Toxic encephalopathy: Secondary | ICD-10-CM | POA: Diagnosis present

## 2011-04-30 DIAGNOSIS — G929 Unspecified toxic encephalopathy: Secondary | ICD-10-CM | POA: Diagnosis present

## 2011-04-30 DIAGNOSIS — R578 Other shock: Secondary | ICD-10-CM | POA: Diagnosis present

## 2011-04-30 DIAGNOSIS — R7402 Elevation of levels of lactic acid dehydrogenase (LDH): Secondary | ICD-10-CM | POA: Diagnosis present

## 2011-04-30 DIAGNOSIS — D7289 Other specified disorders of white blood cells: Secondary | ICD-10-CM

## 2011-04-30 HISTORY — DX: Anxiety disorder, unspecified: F41.9

## 2011-04-30 LAB — CBC
RBC: 4.48 MIL/uL (ref 3.87–5.11)
WBC: 39.6 10*3/uL — ABNORMAL HIGH (ref 4.0–10.5)

## 2011-04-30 LAB — GLUCOSE, CAPILLARY
Glucose-Capillary: 107 mg/dL — ABNORMAL HIGH (ref 70–99)
Glucose-Capillary: 197 mg/dL — ABNORMAL HIGH (ref 70–99)
Glucose-Capillary: 259 mg/dL — ABNORMAL HIGH (ref 70–99)
Glucose-Capillary: 276 mg/dL — ABNORMAL HIGH (ref 70–99)
Glucose-Capillary: 441 mg/dL — ABNORMAL HIGH (ref 70–99)
Glucose-Capillary: 462 mg/dL — ABNORMAL HIGH (ref 70–99)
Glucose-Capillary: 79 mg/dL (ref 70–99)
Glucose-Capillary: 91 mg/dL (ref 70–99)
Glucose-Capillary: >600 mg/dL (ref 70–99)

## 2011-04-30 LAB — URINE MICROSCOPIC-ADD ON

## 2011-04-30 LAB — COMPREHENSIVE METABOLIC PANEL
ALT: 60 U/L — ABNORMAL HIGH (ref 0–35)
AST: 169 U/L — ABNORMAL HIGH (ref 0–37)
CO2: <5 mEq/L — CL (ref 19–32)
Calcium: 9.4 mg/dL (ref 8.4–10.5)
Chloride: 83 mEq/L — ABNORMAL LOW (ref 96–112)
Creatinine, Ser: 1.7 mg/dL — ABNORMAL HIGH (ref 0.50–1.10)
GFR calc Af Amer: 45 mL/min — ABNORMAL LOW (ref >90)
GFR calc non Af Amer: 39 mL/min — ABNORMAL LOW (ref >90)
Glucose, Bld: 655 mg/dL (ref 70–99)
Sodium: 142 mEq/L (ref 135–145)
Total Bilirubin: 0.2 mg/dL — ABNORMAL LOW (ref 0.3–1.2)

## 2011-04-30 LAB — BLOOD GAS, ARTERIAL
Patient temperature: 97.5
pH, Arterial: 6.947 — CL (ref 7.350–7.400)

## 2011-04-30 LAB — DIFFERENTIAL
Basophils Absolute: 0 10*3/uL (ref 0.0–0.1)
Basophils Relative: 0 % (ref 0–1)
Eosinophils Absolute: 0 10*3/uL (ref 0.0–0.7)
Lymphocytes Relative: 18 % (ref 12–46)
Lymphs Abs: 7.1 10*3/uL — ABNORMAL HIGH (ref 0.7–4.0)
Neutro Abs: 27 10*3/uL — ABNORMAL HIGH (ref 1.7–7.7)

## 2011-04-30 LAB — PROCALCITONIN: Procalcitonin: 0.57 ng/mL

## 2011-04-30 LAB — LIPASE, BLOOD: Lipase: 7 U/L — ABNORMAL LOW (ref 11–59)

## 2011-04-30 LAB — URINALYSIS, ROUTINE W REFLEX MICROSCOPIC
Bilirubin Urine: NEGATIVE
Nitrite: NEGATIVE
Protein, ur: 30 mg/dL — AB
Urobilinogen, UA: 0.2 mg/dL (ref 0.0–1.0)

## 2011-04-30 LAB — RAPID URINE DRUG SCREEN, HOSP PERFORMED
Amphetamines: NOT DETECTED
Opiates: NOT DETECTED

## 2011-04-30 LAB — POCT I-STAT, CHEM 8
Chloride: 104 mEq/L (ref 96–112)
Creatinine, Ser: 1.8 mg/dL — ABNORMAL HIGH (ref 0.50–1.10)
Glucose, Bld: 662 mg/dL (ref 70–99)
Potassium: 5.3 mEq/L — ABNORMAL HIGH (ref 3.5–5.1)

## 2011-04-30 LAB — BASIC METABOLIC PANEL
Calcium: 7.7 mg/dL — ABNORMAL LOW (ref 8.4–10.5)
Creatinine, Ser: 1.03 mg/dL (ref 0.50–1.10)
GFR calc Af Amer: 83 mL/min — ABNORMAL LOW (ref >90)

## 2011-04-30 LAB — AMYLASE: Amylase: 35 U/L (ref 0–105)

## 2011-04-30 MED ORDER — SODIUM CHLORIDE 0.9 % IV BOLUS (SEPSIS)
750.0000 mL | Freq: Once | INTRAVENOUS | Status: AC
  Administered 2011-04-30: 750 mL via INTRAVENOUS

## 2011-04-30 MED ORDER — SODIUM CHLORIDE 0.9 % IV SOLN
INTRAVENOUS | Status: DC
  Administered 2011-04-30: 8.7 [IU]/h via INTRAVENOUS
  Administered 2011-04-30: 13 [IU]/h via INTRAVENOUS
  Administered 2011-04-30: 5.4 [IU]/h via INTRAVENOUS
  Administered 2011-05-01: 3.1 [IU]/h via INTRAVENOUS
  Filled 2011-04-30 (×3): qty 1

## 2011-04-30 MED ORDER — SODIUM CHLORIDE 0.9 % IV SOLN
Freq: Once | INTRAVENOUS | Status: AC
  Administered 2011-04-30: 1000 mL via INTRAVENOUS

## 2011-04-30 MED ORDER — DEXTROSE 50 % IV SOLN: 25.0000 mL | INTRAVENOUS | Status: DC | PRN

## 2011-04-30 MED ORDER — METOCLOPRAMIDE HCL 5 MG/ML IJ SOLN
5.0000 mg | Freq: Four times a day (QID) | INTRAMUSCULAR | Status: DC
  Administered 2011-04-30 – 2011-05-03 (×11): 5 mg via INTRAVENOUS
  Administered 2011-05-03: 05:00:00 via INTRAVENOUS
  Administered 2011-05-03 – 2011-05-04 (×5): 5 mg via INTRAVENOUS
  Filled 2011-04-30 (×5): qty 1
  Filled 2011-04-30: qty 2
  Filled 2011-04-30 (×6): qty 1
  Filled 2011-04-30: qty 2
  Filled 2011-04-30 (×3): qty 1
  Filled 2011-04-30: qty 2
  Filled 2011-04-30 (×2): qty 1

## 2011-04-30 MED ORDER — SODIUM CHLORIDE 0.9 % IV SOLN
INTRAVENOUS | Status: AC
  Administered 2011-04-30: 999 mL via INTRAVENOUS

## 2011-04-30 MED ORDER — SODIUM CHLORIDE 0.9 % IV BOLUS (SEPSIS)
1000.0000 mL | Freq: Once | INTRAVENOUS | Status: AC
  Administered 2011-04-30: 1000 mL via INTRAVENOUS

## 2011-04-30 MED ORDER — SODIUM CHLORIDE 0.9 % IV SOLN
INTRAVENOUS | Status: DC
  Filled 2011-04-30: qty 1

## 2011-04-30 MED ORDER — DEXTROSE-NACL 5-0.45 % IV SOLN: INTRAVENOUS | Status: DC

## 2011-04-30 MED ORDER — SODIUM CHLORIDE 0.45 % IV SOLN: INTRAVENOUS | Status: DC

## 2011-04-30 MED ORDER — SODIUM CHLORIDE 0.9 % IV SOLN
750.0000 mL | INTRAVENOUS | Status: DC | PRN
  Administered 2011-04-30: 750 mL via INTRAVENOUS
  Administered 2011-04-30: 20 mL via INTRAVENOUS
  Administered 2011-04-30: 750 mL via INTRAVENOUS

## 2011-04-30 MED ORDER — DEXTROSE 50 % IV SOLN
25.0000 mL | INTRAVENOUS | Status: DC | PRN
  Filled 2011-04-30: qty 25

## 2011-04-30 MED ORDER — ONDANSETRON HCL 4 MG/2ML IJ SOLN
4.0000 mg | Freq: Four times a day (QID) | INTRAMUSCULAR | Status: DC | PRN
  Administered 2011-04-30 – 2011-05-02 (×6): 4 mg via INTRAVENOUS
  Filled 2011-04-30 (×6): qty 2

## 2011-04-30 MED ORDER — DEXTROSE 5 % IV SOLN
2.0000 g | INTRAVENOUS | Status: DC
  Administered 2011-04-30 – 2011-05-02 (×3): 2 g via INTRAVENOUS
  Filled 2011-04-30 (×5): qty 2

## 2011-04-30 MED ORDER — SODIUM CHLORIDE 0.9 % IV SOLN: INTRAVENOUS | Status: DC

## 2011-04-30 MED ORDER — SODIUM CHLORIDE 0.45 % IV SOLN
INTRAVENOUS | Status: DC
  Administered 2011-04-30: 07:00:00 via INTRAVENOUS
  Filled 2011-04-30 (×3): qty 100

## 2011-04-30 MED ORDER — ALPRAZOLAM 0.25 MG PO TABS
0.2500 mg | ORAL_TABLET | Freq: Three times a day (TID) | ORAL | Status: DC | PRN
  Administered 2011-04-30 – 2011-05-03 (×4): 0.25 mg via ORAL
  Filled 2011-04-30 (×4): qty 1

## 2011-04-30 MED ORDER — DEXTROSE-NACL 5-0.45 % IV SOLN
INTRAVENOUS | Status: DC
  Administered 2011-04-30: 16:00:00 via INTRAVENOUS

## 2011-04-30 MED ORDER — PANTOPRAZOLE SODIUM 40 MG IV SOLR
40.0000 mg | Freq: Two times a day (BID) | INTRAVENOUS | Status: DC
  Administered 2011-04-30 – 2011-05-03 (×7): 40 mg via INTRAVENOUS
  Filled 2011-04-30 (×8): qty 40

## 2011-04-30 MED ORDER — HEPARIN SODIUM (PORCINE) 5000 UNIT/ML IJ SOLN
5000.0000 [IU] | Freq: Three times a day (TID) | INTRAMUSCULAR | Status: DC
  Administered 2011-04-30 – 2011-05-04 (×13): 5000 [IU] via SUBCUTANEOUS
  Filled 2011-04-30 (×16): qty 1

## 2011-04-30 NOTE — ED Notes (Signed)
EKG given to EDP, Pickering, MD. 

## 2011-04-30 NOTE — Progress Notes (Signed)
eLink Physician-Brief Progress Note Patient Name: [name] DOB: 27-Aug-1980 MRN: [id number]  Date of Service  04/30/2011   HPI/Events of Note   Hypotension with BP of 83/52 (59) with HR of 144 and sats of 100%.  Received 4 liters of fluid earlier with CVP of 7.  eICU Interventions  Plan: 1 liter of NS IV over 1 hour for ongoing hypotension   Intervention Category Intermediate Interventions: Hypovolemia - evaluation and management;Hypotension - evaluation and management  Eliya Geiman 04/30/2011, 9:54 PM

## 2011-04-30 NOTE — H&P (Signed)
Name: [name] MRN: [id number] DOB: 1980/08/24    LOS: 0  PCCM ADMISSION NOTE  History of Present Illness: 31 yo insulin-dependent diabetic with  History of medical noncompliance brought to [facility] ED with hyperglycemia and altered mental status.  Lines / Drains: 3/8  Foley  Cultures: 3/8  Blood 3/8  Urine  Antibiotics: 3/8  Ceftriaxone (empirical)   Tests / Events: 3/8  Brought to [facility] ED hyperglycemic, with altered mental status  The patient is obtunded and unable to provide history, which was obtained for available medical records.    Past Medical History  Diagnosis Date  . Diabetes mellitus   . Thyroid disease   . Back pain   . Panic attack   . PONV (postoperative nausea and vomiting)   . Anxiety   . GERD (gastroesophageal reflux disease)    Past Surgical History  Procedure Date  . Laparoscopy     age 21  . Back surgery     age 71   Prior to Admission medications   Medication Sig Start Date End Date Taking? Authorizing Provider  ALPRAZolam Prudy Feeler) 1 MG tablet Take 0.5-1 mg by mouth 3 (three) times daily as needed. For anxiety.   Yes Historical Provider, MD  insulin lispro (HUMALOG) 100 UNIT/ML injection continuous.   Yes Historical Provider, MD  LORazepam (ATIVAN) 1 MG tablet Take 1 mg by mouth 2 (two) times daily.   Yes Historical Provider, MD  Multiple Vitamin (MULITIVITAMIN WITH MINERALS) TABS Take 1 tablet by mouth daily.   Yes Historical Provider, MD  oxyCODONE (OXY IR/ROXICODONE) 5 MG immediate release tablet Take 1 tablet (5 mg total) by mouth every 4 (four) hours as needed. 04/23/11 05/03/11 Yes Estela Philip Aspen, MD  traMADol (ULTRAM) 50 MG tablet Take 25-50 mg by mouth every 8 (eight) hours as needed. For pain.   Yes Historical Provider, MD   Allergies Allergies  Allergen Reactions  . Scallops (Shellfish Allergy) Anaphylaxis  . Morphine And Related Itching   Family History No family history on file.  Social History  reports that she quit  smoking about 11 years ago. She has quit using smokeless tobacco. She reports that she drinks alcohol. She reports that she does not use illicit drugs.  Review Of Systems  Patient unable to provide  Vital Signs: Temp:  [97.5 F (36.4 C)] 97.5 F (36.4 C) (03/08 0413) Pulse Rate:  [135] 135  (03/08 0413) Resp:  [38] 38  (03/08 0413) BP: (109)/(63) 109/63 mmHg (03/08 0413) SpO2:  [100 %] 100 % (03/08 0413)   Physical Examination: General:  Obtunded, but localizes to pain, Kussmaul breathing  Neuro:  Nonfocal, cough / gag present HEENT:  PERRL, pink conjunctivae,dry membranes Neck:  Supple, no JVD   Cardiovascular:  Tachicardia, regular rhythm, no M/R/G Lungs:  Bilateral diminished air entry, no W/R/R Abdomen:  Soft, nontender, nondistended, bowel sounds present Musculoskeletal:  Moves all extremities, no pedal edema Skin:  No rash  Ventilator settings:    Labs and Imaging:  Reviewed.  Please refer to the Assessment and Plan section for relevant results.  ASSESSMENT AND PLAN  NEUROLOGIC A:  Encephalopathy, metabolic.  Also multiple psychotropic medications (benzodiazepines, opioids) may play a role.  Protects airway. P: -->  Hold psychotropic medications -->  Treat underline cause -->  Toxicology / ethanol screen  PULMONARY  Lab 04/30/11 0525  PHART 6.947*  PCO2ART 14.3*  PO2ART 75.9*  HCO3 3.0*  O2SAT 83.5   A:  Metabolic acidosis (AG>30) with  appropriate respiratory compensation. P: -->  No indications for intubation  / mechanical ventilation at this time -->  Supplemental oxygen to keep SpO2>92% -->  1/2 NS + 100 mEq bicarbonate gtt as pH < 7  CARDIOVASCULAR No results found for this basename: TROPONINI:5,LATICACIDVEN:5, O2SATVEN:5,PROBNP:5 in the last 168 hours A:  Hemodynamically stable.  No arrhythmia / ischemia. P: -->  Telemetry  RENAL  Lab 04/30/11 0512 04/30/11 0500  NA 139 142  K 5.3* 5.4*  CL 104 83*  CO2 -- <5*  BUN 27* 26*  CREATININE  1.80* 1.70*  CALCIUM -- 9.4  MG -- --  PHOS -- --   A:  Mild hyperkalemia, likely secondary to acidosis.  Acute renal failure vs prerenal azotemia. P: -->  NS boluses 1000 mL x 4, then reassess  GASTROINTESTINAL  Lab 04/30/11 0500  AST 169*  ALT 60*  ALKPHOS 199*  BILITOT 0.2*  PROT 8.4*  ALBUMIN 4.6   A:  Elevated transaminases, alcohol pattern. P: -->  Trend -->  Ethanol level -->  NPO  HEMATOLOGIC  Lab 04/30/11 0512 04/30/11 0500  HGB 16.0* 14.2  HCT 47.0* 44.8  PLT -- 746*  INR -- --  APTT -- --   A:  Leucocytosis / thrombocytosis, likely reactive.  Hemoconcentration. P: -->  CBC trend CBC  INFECTIOUS  Lab 04/30/11 0500  WBC 39.6*  PROCALCITON --   A:  Elevated WBC, likely reactive.  No clear source of infection. P: -->  Blood / urine culture -->  PCT -->  Empirical Ceftriaxone  ENDOCRINE  Lab 04/30/11 0426 04/30/11 0423 04/23/11 1320 04/23/11 0736  GLUCAP >600* >600* 372* 347*   A:  DKA, history of insulin dependent DM P: -->  DKA protocol  BEST PRACTICE / DISPOSITION -->  ICU status under PCCM -->  Full code -->  NPO -->  Heparin Harrison for DVT Px -->  GI Px is not required -->  Family is not available  The patient is critically ill with multiple organ systems failure and requires high complexity decision making for assessment and support, frequent evaluation and titration of therapies, application of advanced monitoring technologies and extensive interpretation of multiple databases. Critical Care Time devoted to patient care services described in this note is  50 minutes.  Orlean Bradford, M.D., F.C.C.P. Pulmonary and Critical Care Medicine [facility] Cell: [phone] Pager: [phone]  04/30/2011, 6:42 AM

## 2011-04-30 NOTE — ED Notes (Signed)
Spoke with PCCM on call MD. Informed of pts request for pain meds. Sts he does not want to give IV pain meds at this time due to pt's pH of 6.9. Pt not coherent enough at this time to tolerate PO meds without risk of aspiration. MD sts to hold off on pain meds until next MD evaluates upstairs. MD also informed that pt was stuck 6x by 3 different staff members and lab work was unable to be obtained. No additional orders received. MD also verified ICU SD status.

## 2011-04-30 NOTE — ED Notes (Signed)
ZOX:[id number]<AV> Expected date:04/30/11<BR> Expected time: 3:23 AM<BR> Means of arrival:Ambulance<BR> Comments:<BR> hyperglycemic

## 2011-04-30 NOTE — ED Notes (Signed)
Pt. Has seizure pads at bedside for safety precautions.

## 2011-04-30 NOTE — ED Notes (Signed)
Attempted to draw a BMP but was not able, meds were given and pt grunted to needle of heparin shot she was put in for a bed Rocephen give and bicarb drip hanging.

## 2011-04-30 NOTE — ED Provider Notes (Signed)
History     CSN: [id number]  Arrival date & time 04/30/11  0400   First MD Initiated Contact with Patient 04/30/11 0403      Chief Complaint  Patient presents with  . Hyperglycemia   level V caveat due to altered mental status  (Consider location/radiation/quality/duration/timing/severity/associated sxs/prior treatment) The history is provided by the patient and the EMS personnel.   patient was brought in by EMS for high blood sugar. His been higher than the machine will read for EMS. She's a history of diabetes in DKA. She will not talk to me and is just breathing quickly.  Past Medical History  Diagnosis Date  . Diabetes mellitus   . Thyroid disease   . Back pain   . Panic attack   . PONV (postoperative nausea and vomiting)   . Anxiety   . GERD (gastroesophageal reflux disease)     Past Surgical History  Procedure Date  . Laparoscopy     age 31  . Back surgery     age 31    No family history on file.  History  Substance Use Topics  . Smoking status: Former Smoker    Quit date: 04/19/2000  . Smokeless tobacco: Former Neurosurgeon  . Alcohol Use: Yes     social    OB History    Grav Para Term Preterm Abortions TAB SAB Ect Mult Living                  Review of Systems  Unable to perform ROS: Mental status change    Allergies  Scallops and Morphine and related  Home Medications   Current Outpatient Rx  Name Route Sig Dispense Refill  . ALPRAZOLAM 1 MG PO TABS Oral Take 0.5-1 mg by mouth 3 (three) times daily as needed. For anxiety.    Marland Kitchen CLONIDINE HCL 0.1 MG PO TABS Oral Take 0.1 mg by mouth 2 (two) times daily.    . INSULIN LISPRO (HUMAN) 100 UNIT/ML [place] SOLN  continuous.    Marland Kitchen LORAZEPAM 1 MG PO TABS Oral Take 1 mg by mouth 2 (two) times daily.    . ADULT MULTIVITAMIN W/MINERALS CH Oral Take 1 tablet by mouth daily.    Marland Kitchen OMEPRAZOLE 20 MG PO CPDR Oral Take 20 mg by mouth daily.    Marland Kitchen ONDANSETRON HCL 8 MG PO TABS Oral Take by mouth every 8 (eight) hours as  needed. Nausea    . OXYCODONE HCL 5 MG PO TABS Oral Take 1 tablet (5 mg total) by mouth every 4 (four) hours as needed. 30 tablet 0  . TEMAZEPAM 15 MG PO CAPS Oral Take 15 mg by mouth at bedtime as needed. Insomnia    . TRAMADOL HCL 50 MG PO TABS Oral Take 25-50 mg by mouth every 8 (eight) hours as needed. For pain.      BP 109/63  Pulse 135  Temp(Src) 97.5 F (36.4 C) (Oral)  Resp 38  SpO2 100%  LMP 03/26/2011  Physical Exam  Constitutional: She appears well-developed.  HENT:  Head: Normocephalic.  Eyes:       Pupils are somewhat dilated bilaterally, they are reactive  Neck: Normal range of motion.  Cardiovascular:       Tachycardia  Pulmonary/Chest:       Tachypnea  Abdominal: Soft. There is no tenderness.  Musculoskeletal: Normal range of motion.  Neurological:       Patient is a breathing quickly and will not talk to me.  Skin:  Skin is warm. No erythema.    ED Course  Procedures (including critical care time)  Labs Reviewed  GLUCOSE, CAPILLARY - Abnormal; Notable for the following:    Glucose-Capillary >600 (*)    All other components within normal limits  GLUCOSE, CAPILLARY - Abnormal; Notable for the following:    Glucose-Capillary >600 (*)    All other components within normal limits  CBC - Abnormal; Notable for the following:    WBC 39.6 (*)    RDW 16.7 (*)    Platelets 746 (*)    All other components within normal limits  DIFFERENTIAL - Abnormal; Notable for the following:    Monocytes Relative 14 (*)    Neutro Abs 27.0 (*)    Lymphs Abs 7.1 (*)    Monocytes Absolute 5.5 (*)    All other components within normal limits  COMPREHENSIVE METABOLIC PANEL - Abnormal; Notable for the following:    Potassium 5.4 (*)    Chloride 83 (*)    CO2 <5 (*)    Glucose, Bld 655 (*)    BUN 26 (*)    Creatinine, Ser 1.70 (*)    Total Protein 8.4 (*)    AST 169 (*)    ALT 60 (*)    Alkaline Phosphatase 199 (*)    Total Bilirubin 0.2 (*)    GFR calc non Af Amer 39  (*)    GFR calc Af Amer 45 (*)    All other components within normal limits  URINALYSIS, ROUTINE W REFLEX MICROSCOPIC - Abnormal; Notable for the following:    Glucose, UA >1000 (*)    Hgb urine dipstick SMALL (*)    Ketones, ur 40 (*)    Protein, ur 30 (*)    All other components within normal limits  ETHANOL - Abnormal; Notable for the following:    Alcohol, Ethyl (B) 153 (*)    All other components within normal limits  POCT I-STAT, CHEM 8 - Abnormal; Notable for the following:    Potassium 5.3 (*)    BUN 27 (*)    Creatinine, Ser 1.80 (*)    Glucose, Bld 662 (*)    Calcium, Ion 0.92 (*)    Hemoglobin 16.0 (*)    HCT 47.0 (*)    All other components within normal limits  BLOOD GAS, ARTERIAL - Abnormal; Notable for the following:    pH, Arterial 6.947 (*)    pCO2 arterial 14.3 (*)    pO2, Arterial 75.9 (*)    Bicarbonate 3.0 (*)    Acid-base deficit 29.3 (*)    All other components within normal limits  GLUCOSE, CAPILLARY - Abnormal; Notable for the following:    Glucose-Capillary 585 (*)    All other components within normal limits  PREGNANCY, URINE  URINE RAPID DRUG SCREEN (HOSP PERFORMED)  URINE MICROSCOPIC-ADD ON  BASIC METABOLIC PANEL  BASIC METABOLIC PANEL  BASIC METABOLIC PANEL  BASIC METABOLIC PANEL  CULTURE, BLOOD (ROUTINE X 2)  CULTURE, BLOOD (ROUTINE X 2)  URINE CULTURE  HCG, SERUM, QUALITATIVE  AMYLASE  LIPASE, BLOOD  PROCALCITONIN   Dg Chest Port 1 View  04/30/2011  *RADIOLOGY REPORT*  Clinical Data: Hyperglycemia, decreased level of consciousness  PORTABLE CHEST - 1 VIEW  Comparison: Portable exam 0505 hours compared to 07/18/2011  Findings: Normal heart size, mediastinal contours, and pulmonary vascularity. Lungs clear. No pleural effusion or pneumothorax. Surgical clips in cervical region bilaterally. Bilateral nipple rings. External artifact projects over cervical spine Bones unremarkable.  IMPRESSION: No acute abnormalities.  Original Report  Authenticated By: Lollie Marrow, M.D.     1. DKA (diabetic ketoacidoses)     Date: 04/30/2011  Rate: 136  Rhythm: sinus tachycardia  QRS Axis: normal  Intervals: normal  ST/T Wave abnormalities: nonspecific T wave changes  Conduction Disutrbances:none  Narrative Interpretation:   Old EKG Reviewed: none available  CRITICAL CARE Performed by: Billee Cashing   Total critical care time:30  Critical care time was exclusive of separately billable procedures and treating other patients.  Critical care was necessary to treat or prevent imminent or life-threatening deterioration.  Critical care was time spent personally by me on the following activities: development of treatment plan with patient and/or surrogate as well as nursing, discussions with consultants, evaluation of patient's response to treatment, examination of patient, obtaining history from patient or surrogate, ordering and performing treatments and interventions, ordering and review of laboratory studies, ordering and review of radiographic studies, pulse oximetry and re-evaluation of patient's condition.     MDM  Patient has severe DKA. PH is 6.9. Patient admitted the ICU. She's had IV fluid boluses and started on an insulin drip. Critical care was consulted, but he stated medicine will admit and they will consult. [facility] was also consulted for admission. She is worsening renal function. Her LFTs and elevated. Her mental status is decreased. She also has an elevated alcohol level.        Juliet Rude. Rubin Payor, MD 04/30/11 0454

## 2011-04-30 NOTE — ED Notes (Signed)
CBG 535 

## 2011-04-30 NOTE — ED Notes (Signed)
CBG HH on glucometer with EMS

## 2011-04-30 NOTE — ED Notes (Signed)
Care of pt assumed. Verified insulin drip with Mosetta Putt, RN. Pt noted to remain tachycardic, 131. Pt moaning with each breath. Will not speak to staff. Will open eyes to command. Only nods head to some questions. Attempted to assess pain, however pt would not answer questions. Pt updated on bed status and plan of care. Awaiting bed assignment.

## 2011-04-30 NOTE — Procedures (Addendum)
Central Venous Catheter Insertion Procedure Note [name] [id number] 1980-11-10  Procedure: Insertion of Central Venous Catheter Indications: Assessment of intravascular volume  Procedure Details Consent: obtained Time Out: Verified patient identification, verified procedure, site/side was marked, verified correct patient position, special equipment/implants available, medications/allergies/relevent history reviewed, required imaging and test results available.  Performed  Maximum sterile technique was used including antiseptics, cap, gloves, gown, hand hygiene, mask and sheet. Skin prep: Chlorhexidine; local anesthetic administered A antimicrobial bonded/coated triple lumen catheter was placed in the right internal jugular vein using the Seldinger technique.  Evaluation Blood flow good Complications: No apparent complications Patient did tolerate procedure well. Chest X-ray ordered to verify placement.  CXR: normal.  BABCOCK,PETE 04/30/2011, 1:24 PM   Levy Pupa, MD, PhD 04/30/2011, 2:06 PM  Pulmonary and Critical Care [phone] or if no answer [phone]

## 2011-04-30 NOTE — Progress Notes (Addendum)
Name: [name] MRN: [id number] DOB: 10-03-1980    LOS: 0  PCCM progress Note  History of Present Illness: 31 yo insulin-dependent diabetic with  History of medical noncompliance brought to [facility] ED with hyperglycemia and altered mental status.  Lines / Drains: 3/8 right IJ CVL>>>  Cultures: 3/8  Blood 3/8  Urine  Antibiotics: 3/8  Ceftriaxone (empirical)   Tests / Events: 3/8  Brought to [facility] ED hyperglycemic, with altered mental status  Subjective More awake and alert  Vital Signs: BP 81/45  Pulse 118  Temp(Src) 97.9 F (36.6 C) (Oral)  Resp 20  Ht 5\' 4"  (1.626 m)  Wt 48.2 kg (106 lb 4.2 oz)  BMI 18.24 kg/m2  SpO2 100%  LMP 03/26/2011      . sodium chloride 999 mL (04/30/11 0701)  . dextrose 5 % and 0.45% NaCl    . insulin (NOVOLIN-R) infusion 12.4 Units/hr (04/30/11 1310)  .  sodium bicarbonate infusion 1000 mL 100 mL/hr at 04/30/11 0658  . DISCONTD: sodium chloride    . DISCONTD: sodium chloride    . DISCONTD: sodium chloride    . DISCONTD: dextrose 5 % and 0.45% NaCl    . DISCONTD: insulin (NOVOLIN-R) infusion      Intake/Output Summary (Last 24 hours) at 04/30/11 1323 Last data filed at 04/30/11 1021  Gross per 24 hour  Intake      0 ml  Output    825 ml  Net   -825 ml      Physical Examination: General:  Awake, uncomfortable.  Neuro:  Nonfocal, oriented X 3 HEENT:  PERRL, pink conjunctivae,dry membranes Neck:  Supple, no JVD   Cardiovascular:  Tachycardia, regular rhythm, no M/R/G Lungs:  Bilateral diminished air entry, no W/R/R Abdomen:  Soft, nontender, nondistended, bowel sounds present Musculoskeletal:  Moves all extremities, no pedal edema Skin:  No rash     Labs and Imaging:  Reviewed.  Please refer to the Assessment and Plan section for relevant results.  ASSESSMENT AND PLAN  ENDOCRINE  Lab 04/30/11 1038 04/30/11 0927 04/30/11 0822 04/30/11 0723 04/30/11 0512 04/30/11 0500  GLUCAP 441* 462* 535* 585* -- --  CO2 -- -- -- --  -- <5*  K -- -- -- -- 5.3* 5.4*  A:  DKA, history of insulin dependent DM P: - DKA protocol  CARDIOVASCULAR No results found for this basename: TROPONINI:5,LATICACIDVEN:5, O2SATVEN:5,PROBNP:5 in the last 168 hours A: Hypovolemic shock in setting of DKA. Still Hypotensive in spite of IVFs. Unable to obtain lab work.  P: -central access and check CVP -Cont IVFs  NEUROLOGIC A: Acute metabolic Encephalopathy.  Also multiple psychotropic medications (benzodiazepines, opioids) may play a role.  Protects airway. Improved.  P: -Hold psychotropic medications -supportive care  RENAL  Lab 04/30/11 0512 04/30/11 0500  NA 139 142  K 5.3* 5.4*  CL 104 83*  CO2 -- <5*  BUN 27* 26*  CREATININE 1.80* 1.70*  CALCIUM -- 9.4  MG -- --  PHOS -- --   A:  Metabolic acidosis (AG >30) Mild hyperkalemia, likely secondary to acidosis.  Acute renal failure vs prerenal azotemia. Currently on bicarb gtt.  P: - cont IVFs -repeat ABG -f/u chemistry every 2, will d/c bicarb if PH >7.2  GASTROINTESTINAL  Lab 04/30/11 0500  AST 169*  ALT 60*  ALKPHOS 199*  BILITOT 0.2*  PROT 8.4*  ALBUMIN 4.6   A:  Elevated transaminases, alcohol pattern. Marked reflux and h/o gastroparesis.  P: -Trend LFTs -add PPI  and scheduled reglan   HEMATOLOGIC  Lab 04/30/11 0512 04/30/11 0500  HGB 16.0* 14.2  HCT 47.0* 44.8  PLT -- 746*  INR -- --  APTT -- --   A:  Leukocytosis / thrombocytosis, likely reactive.  Hemoconcentration. P: -trend CBC  INFECTIOUS  Lab 04/30/11 0500  WBC 39.6*  PROCALCITON 1.24   A:  Elevated WBC, likely reactive.  No clear source of infection. P: -F/u Blood / urine culture -Empirical Ceftriaxone    BEST PRACTICE / DISPOSITION -ICU status under PCCM -Full code -NPO -Heparin [place] for DVT Px -->  GI Px is not required -->  Family is not available  Levy Pupa, MD, PhD 04/30/2011, 2:06 PM [facility] [phone] or if no answer [phone]

## 2011-04-30 NOTE — ED Notes (Signed)
ETOH smell present

## 2011-04-30 NOTE — ED Notes (Signed)
Pt present to ED moaning, unable to answer any questions. Diabetic, CBG >600. ETOH on board per EMS. Pt is unresponsive to voice and able to follow some command. Pupil reactive. Tachy at 138. BP WNL. ABC intact at the moment. Pt unable to stay still in bed, seizure pad applied as a safety precaution rather than Seizure precaution.

## 2011-04-30 NOTE — Progress Notes (Signed)
Left message with Dr. Daune Perch answering service that pt was in hospital and was unable to make it to her appointment today.

## 2011-04-30 NOTE — ED Notes (Addendum)
Portable xray at bedside.

## 2011-04-30 NOTE — Progress Notes (Signed)
eLink Physician-Brief Progress Note Patient Name: [name] DOB: 1980/12/22 MRN: [id number]  Date of Service  04/30/2011   HPI/Events of Note  Received 5 liters of NS for ongoing hypotension with BP 81/41 (51) and CVP of 8.  Sats of 100% in no resp distress   eICU Interventions  Plan: 1. Additional liter of NS now for ongoing hypotension   Intervention Category Intermediate Interventions: Hypovolemia - evaluation and management;Hypotension - evaluation and management  Brodee Mauritz 04/30/2011, 11:51 PM

## 2011-05-01 LAB — BASIC METABOLIC PANEL
BUN: 8 mg/dL (ref 6–23)
Calcium: 6.7 mg/dL — ABNORMAL LOW (ref 8.4–10.5)
Chloride: 101 mEq/L (ref 96–112)
Creatinine, Ser: 0.62 mg/dL (ref 0.50–1.10)
GFR calc Af Amer: >90 mL/min (ref >90)
GFR calc Af Amer: >90 mL/min (ref >90)
GFR calc non Af Amer: >90 mL/min (ref >90)
GFR calc non Af Amer: >90 mL/min (ref >90)
Glucose, Bld: 155 mg/dL — ABNORMAL HIGH (ref 70–99)
Glucose, Bld: 155 mg/dL — ABNORMAL HIGH (ref 70–99)
Potassium: 3.8 mEq/L (ref 3.5–5.1)
Sodium: 136 mEq/L (ref 135–145)

## 2011-05-01 LAB — GLUCOSE, CAPILLARY
Glucose-Capillary: 114 mg/dL — ABNORMAL HIGH (ref 70–99)
Glucose-Capillary: 120 mg/dL — ABNORMAL HIGH (ref 70–99)
Glucose-Capillary: 126 mg/dL — ABNORMAL HIGH (ref 70–99)
Glucose-Capillary: 132 mg/dL — ABNORMAL HIGH (ref 70–99)
Glucose-Capillary: 133 mg/dL — ABNORMAL HIGH (ref 70–99)
Glucose-Capillary: 138 mg/dL — ABNORMAL HIGH (ref 70–99)
Glucose-Capillary: 152 mg/dL — ABNORMAL HIGH (ref 70–99)
Glucose-Capillary: 162 mg/dL — ABNORMAL HIGH (ref 70–99)
Glucose-Capillary: 214 mg/dL — ABNORMAL HIGH (ref 70–99)
Glucose-Capillary: 236 mg/dL — ABNORMAL HIGH (ref 70–99)
Glucose-Capillary: 238 mg/dL — ABNORMAL HIGH (ref 70–99)
Glucose-Capillary: 245 mg/dL — ABNORMAL HIGH (ref 70–99)

## 2011-05-01 LAB — CBC
Hemoglobin: 9.8 g/dL — ABNORMAL LOW (ref 12.0–15.0)
MCHC: 34.6 g/dL (ref 30.0–36.0)
Platelets: 235 10*3/uL (ref 150–400)

## 2011-05-01 LAB — PROCALCITONIN: Procalcitonin: 0.36 ng/mL

## 2011-05-01 LAB — URINE CULTURE
Colony Count: NO GROWTH
Culture: NO GROWTH

## 2011-05-01 LAB — PHOSPHORUS: Phosphorus: 1.5 mg/dL — ABNORMAL LOW (ref 2.3–4.6)

## 2011-05-01 LAB — MAGNESIUM: Magnesium: 1.5 mg/dL (ref 1.5–2.5)

## 2011-05-01 MED ORDER — SODIUM CHLORIDE 0.9 % IJ SOLN
INTRAMUSCULAR | Status: AC
  Filled 2011-05-01: qty 3

## 2011-05-01 MED ORDER — HYDROMORPHONE HCL PF 1 MG/ML IJ SOLN
0.5000 mg | Freq: Once | INTRAMUSCULAR | Status: AC
  Administered 2011-05-01: 0.5 mg via INTRAVENOUS
  Filled 2011-05-01: qty 1

## 2011-05-01 MED ORDER — SODIUM PHOSPHATE 3 MMOLE/ML IV SOLN
30.0000 mmol | Freq: Once | INTRAVENOUS | Status: AC
  Administered 2011-05-01: 30 mmol via INTRAVENOUS
  Filled 2011-05-01: qty 10

## 2011-05-01 MED ORDER — TEMAZEPAM 15 MG PO CAPS: 15.0000 mg | ORAL_CAPSULE | Freq: Every evening | ORAL | Status: DC | PRN

## 2011-05-01 MED ORDER — INSULIN GLARGINE 100 UNIT/ML ~~LOC~~ SOLN
10.0000 [IU] | Freq: Every day | SUBCUTANEOUS | Status: DC
  Administered 2011-05-01: 10 [IU] via SUBCUTANEOUS
  Filled 2011-05-01: qty 3

## 2011-05-01 MED ORDER — INSULIN ASPART 100 UNIT/ML ~~LOC~~ SOLN
0.0000 [IU] | Freq: Three times a day (TID) | SUBCUTANEOUS | Status: DC
  Administered 2011-05-02: 8 [IU] via SUBCUTANEOUS
  Administered 2011-05-02: 15 [IU] via SUBCUTANEOUS
  Administered 2011-05-02 – 2011-05-03 (×3): 5 [IU] via SUBCUTANEOUS
  Filled 2011-05-01: qty 3

## 2011-05-01 MED ORDER — TRAMADOL HCL 50 MG PO TABS
50.0000 mg | ORAL_TABLET | Freq: Two times a day (BID) | ORAL | Status: DC | PRN
  Administered 2011-05-01 – 2011-05-02 (×3): 50 mg via ORAL
  Filled 2011-05-01 (×3): qty 1

## 2011-05-01 MED ORDER — INSULIN ASPART 100 UNIT/ML ~~LOC~~ SOLN
0.0000 [IU] | Freq: Every day | SUBCUTANEOUS | Status: DC
  Administered 2011-05-01 – 2011-05-02 (×2): 3 [IU] via SUBCUTANEOUS

## 2011-05-01 MED ORDER — HYDROMORPHONE HCL PF 1 MG/ML IJ SOLN
0.5000 mg | Freq: Four times a day (QID) | INTRAMUSCULAR | Status: DC | PRN
  Administered 2011-05-01 – 2011-05-03 (×9): 0.5 mg via INTRAVENOUS
  Filled 2011-05-01 (×9): qty 1

## 2011-05-01 MED ORDER — TRAMADOL 5 MG/ML ORAL SUSPENSION: 50.0000 mg | Freq: Two times a day (BID) | ORAL | Status: DC | PRN

## 2011-05-01 NOTE — Progress Notes (Signed)
eLink Physician-Brief Progress Note Patient Name: [name] DOB: [birth year] MRN: [id number]  Date of Service  05/01/2011   HPI/Events of Note  Pain not relieved by ultram.  BP now improved    eICU Interventions  One time dose of dilaudid 0.5 mg IV   Intervention Category Intermediate Interventions: Pain - evaluation and management  Isabel Ardila 05/01/2011, 4:21 AM

## 2011-05-01 NOTE — Progress Notes (Signed)
eLink Physician-Brief Progress Note Patient Name: [name] DOB: Aug 31, 1980 MRN: [id number]  Date of Service  05/01/2011   HPI/Events of Note  Hypophosphatemia   eICU Interventions  Phos replaced   Intervention Category Intermediate Interventions: Electrolyte abnormality - evaluation and management  Ileene Allie 05/01/2011, 5:43 AM

## 2011-05-01 NOTE — Progress Notes (Signed)
eLink Physician-Brief Progress Note Patient Name: TASHAY BOZICH DOB: Jul 27, 1980 MRN: [id number]  Date of Service  05/01/2011   HPI/Events of Note   C/O of pain in trunk region related to her DKA - uses tramadol at home   eICU Interventions  Plan: Order for tramadol 50 mg q12 hour prn pain   Intervention Category Intermediate Interventions: Pain - evaluation and management  Daeveon Zweber 05/01/2011, 12:00 AM

## 2011-05-01 NOTE — Progress Notes (Signed)
Name: [name] MRN: [id number] DOB: May 25, 1980    LOS: 1  PCCM progress Note  History of Present Illness: 31 yo insulin-dependent diabetic with  History of medical noncompliance brought to [facility] ED with hyperglycemia and altered mental status.  Lines / Drains: 3/8 right IJ CVL>>>  Cultures: 3/8  Blood >>  3/8  Urine >>   Antibiotics: 3/8  Ceftriaxone (empirical)   Tests / Events: 3/8  Brought to [facility] ED hyperglycemic, with altered mental status  Subjective BP improved with aggressive IV fluids Received dilaudid x 1 o/n  Vital Signs: BP 108/51  Pulse 94  Temp(Src) 98.8 F (37.1 C) (Oral)  Resp 17  Ht 5\' 4"  (1.626 m)  Wt 48.2 kg (106 lb 4.2 oz)  BMI 18.24 kg/m2  SpO2 99%  LMP 03/26/2011 CVP:  [7 mmHg-10 mmHg] 8 mmHg    . sodium chloride 999 mL (04/30/11 0701)  . dextrose 5 % and 0.45% NaCl 75 mL/hr at 04/30/11 1623  . insulin (NOVOLIN-R) infusion 3.1 Units/hr (05/01/11 4098)  . DISCONTD:  sodium bicarbonate infusion 1000 mL Stopped (04/30/11 1626)    Intake/Output Summary (Last 24 hours) at 05/01/11 0732 Last data filed at 05/01/11 0500  Gross per 24 hour  Intake 5279.81 ml  Output   2405 ml  Net 2874.81 ml    I/O last 3 completed shifts: In: 5279.8 [I.V.:5257.8; IV Piggyback:22] Out: 2405 [Urine:2405] Physical Examination: [name]:  Awake, uncomfortable.  Neuro:  Nonfocal, oriented X 3 HEENT:  PERRL, pink conjunctivae,dry membranes Neck:  Supple, no JVD   Cardiovascular:  Tachycardia, regular rhythm, no M/R/G Lungs:  Bilateral diminished air entry, no W/R/R Abdomen:  Soft, nontender, nondistended, bowel sounds present Musculoskeletal:  Moves all extremities, no pedal edema Skin:  No rash     Labs and Imaging:  Reviewed.  Please refer to the Assessment and Plan section for relevant results.  ASSESSMENT AND PLAN  ENDOCRINE  Lab 05/01/11 0640 05/01/11 0539 05/01/11 0457 05/01/11 0412 05/01/11 0308 04/30/11 1315 04/30/11 0512 04/30/11 0500    GLUCAP 214* 181* -- 133* 139* -- -- --  CO2 -- -- 19 -- -- 12* -- <5*  K -- -- 3.8 -- -- 4.2 5.3* 5.4*  A:  DKA, history of insulin dependent DM P: - DKA protocol, remains on insulin gtt 3/9 am  CARDIOVASCULAR  Lab 04/30/11 1545  TROPONINI --  LATICACIDVEN 2.9*  PROBNP --   A: Hypovolemic shock in setting of DKA.  P: -aggressive volume replacement -careful with narcs (chronic user)  NEUROLOGIC A: Acute metabolic Encephalopathy.  Also multiple psychotropic medications (benzodiazepines, opioids) may play a role.  Protects airway. Improved.  P: -restart  restoril when able to take po, careful with narcs >> restart low dose dilaudid prn -supportive care  RENAL  Lab 05/01/11 0457 04/30/11 1315 04/30/11 0512 04/30/11 0500  NA 136 147* 139 142  K 3.8 4.2 -- --  CL 105 109 104 83*  CO2 19 12* -- <5*  BUN 13 22 27* 26*  CREATININE 0.67 1.03 1.80* 1.70*  CALCIUM 6.7* 7.7* -- 9.4  MG 1.5 -- -- --  PHOS 1.5* -- -- --   A:  Metabolic acidosis (AG >30) Mild hyperkalemia, likely secondary to acidosis.  Acute renal failure vs prerenal azotemia. Currently on bicarb gtt. AG to 12 on 3/9.  P: - cont IVFs and IV insulin as AG closes -bicarb gtt stopped - allow water PO, hold off on calories for now  GASTROINTESTINAL  Lab 04/30/11  0500  AST 169*  ALT 60*  ALKPHOS 199*  BILITOT 0.2*  PROT 8.4*  ALBUMIN 4.6   A:  Elevated transaminases, alcohol pattern. Marked reflux and h/o gastroparesis.  P: -Trend LFTs 3/10 -PPI and scheduled reglan   HEMATOLOGIC  Lab 05/01/11 0457 04/30/11 0512 04/30/11 0500  HGB 9.8* 16.0* 14.2  HCT 28.3* 47.0* 44.8  PLT 235 -- 746*  INR -- -- --  APTT -- -- --   A:  Leukocytosis / thrombocytosis, likely reactive.  Hemoconcentration. P: -trend CBC  INFECTIOUS  Lab 05/01/11 0457 04/30/11 1545 04/30/11 0500  WBC 20.0* -- 39.6*  PROCALCITON 0.36 0.57 1.24   A:  Elevated WBC, likely reactive.  No clear source of infection. P: -F/u Blood /  urine culture -Empirical Ceftriaxone   BEST PRACTICE / DISPOSITION -ICU status under PCCM -Full code -NPO -Heparin [place] for DVT Px -->  GI Px is not required -->  Family is not available  Levy Pupa, MD, PhD 05/01/2011, 7:32 AM [facility] [phone] or if no answer [phone]

## 2011-05-02 LAB — HEPATIC FUNCTION PANEL
Bilirubin, Direct: <0.1 mg/dL (ref 0.0–0.3)
Total Protein: 4.8 g/dL — ABNORMAL LOW (ref 6.0–8.3)

## 2011-05-02 LAB — BASIC METABOLIC PANEL
BUN: 4 mg/dL — ABNORMAL LOW (ref 6–23)
BUN: 5 mg/dL — ABNORMAL LOW (ref 6–23)
BUN: 6 mg/dL (ref 6–23)
CO2: 20 mEq/L (ref 19–32)
CO2: 21 mEq/L (ref 19–32)
Calcium: 7.5 mg/dL — ABNORMAL LOW (ref 8.4–10.5)
Calcium: 7.6 mg/dL — ABNORMAL LOW (ref 8.4–10.5)
Calcium: 9 mg/dL (ref 8.4–10.5)
Chloride: 103 mEq/L (ref 96–112)
Creatinine, Ser: 0.6 mg/dL (ref 0.50–1.10)
Creatinine, Ser: 0.61 mg/dL (ref 0.50–1.10)
Creatinine, Ser: 0.64 mg/dL (ref 0.50–1.10)
GFR calc Af Amer: >90 mL/min (ref >90)
GFR calc non Af Amer: >90 mL/min (ref >90)
GFR calc non Af Amer: >90 mL/min (ref >90)
Glucose, Bld: 337 mg/dL — ABNORMAL HIGH (ref 70–99)
Sodium: 133 mEq/L — ABNORMAL LOW (ref 135–145)

## 2011-05-02 LAB — CBC
MCH: 31.7 pg (ref 26.0–34.0)
MCHC: 34.8 g/dL (ref 30.0–36.0)
MCV: 91.2 fL (ref 78.0–100.0)
Platelets: 185 10*3/uL (ref 150–400)
RDW: 17.2 % — ABNORMAL HIGH (ref 11.5–15.5)

## 2011-05-02 LAB — MAGNESIUM
Magnesium: 1.5 mg/dL (ref 1.5–2.5)
Magnesium: 1.7 mg/dL (ref 1.5–2.5)

## 2011-05-02 LAB — GLUCOSE, CAPILLARY
Glucose-Capillary: 202 mg/dL — ABNORMAL HIGH (ref 70–99)
Glucose-Capillary: 298 mg/dL — ABNORMAL HIGH (ref 70–99)

## 2011-05-02 MED ORDER — POTASSIUM CHLORIDE CRYS ER 20 MEQ PO TBCR
40.0000 meq | EXTENDED_RELEASE_TABLET | Freq: Once | ORAL | Status: AC
  Administered 2011-05-02: 40 meq via ORAL
  Filled 2011-05-02: qty 2

## 2011-05-02 MED ORDER — MAGNESIUM SULFATE 50 % IJ SOLN
1.0000 g | Freq: Once | INTRAMUSCULAR | Status: DC
  Filled 2011-05-02: qty 2

## 2011-05-02 MED ORDER — POTASSIUM PHOSPHATE DIBASIC 3 MMOLE/ML IV SOLN
30.0000 mmol | Freq: Once | INTRAVENOUS | Status: DC
  Filled 2011-05-02: qty 10

## 2011-05-02 MED ORDER — OXYCODONE HCL 5 MG PO TABS
5.0000 mg | ORAL_TABLET | Freq: Four times a day (QID) | ORAL | Status: DC | PRN
  Administered 2011-05-03 – 2011-05-04 (×4): 5 mg via ORAL
  Filled 2011-05-02 (×4): qty 1

## 2011-05-02 MED ORDER — MAGNESIUM SULFATE IN D5W 10-5 MG/ML-% IV SOLN
1.0000 g | Freq: Once | INTRAVENOUS | Status: AC
  Administered 2011-05-02: 1 g via INTRAVENOUS
  Filled 2011-05-02: qty 100

## 2011-05-02 MED ORDER — POTASSIUM PHOSPHATE DIBASIC 3 MMOLE/ML IV SOLN
30.0000 mmol | Freq: Once | INTRAVENOUS | Status: AC
  Administered 2011-05-02: 30 mmol via INTRAVENOUS
  Filled 2011-05-02: qty 10

## 2011-05-02 MED ORDER — TRAMADOL HCL 50 MG PO TABS
25.0000 mg | ORAL_TABLET | Freq: Three times a day (TID) | ORAL | Status: DC | PRN
  Administered 2011-05-02 – 2011-05-04 (×3): 50 mg via ORAL
  Filled 2011-05-02: qty 2
  Filled 2011-05-02 (×2): qty 1

## 2011-05-02 MED ORDER — INSULIN GLARGINE 100 UNIT/ML ~~LOC~~ SOLN
10.0000 [IU] | Freq: Every day | SUBCUTANEOUS | Status: DC
  Administered 2011-05-02: 10 [IU] via SUBCUTANEOUS

## 2011-05-02 NOTE — Progress Notes (Signed)
Arrived from iccu. Alert and oriented. No c/o pain. Moves independently. Family at bedside.

## 2011-05-02 NOTE — Progress Notes (Signed)
eLink Physician-Brief Progress Note Patient Name: [name] DOB: Oct 15, 1980 MRN: [id number]  Date of Service  05/02/2011   HPI/Events of Note  Hypokalemia   eICU Interventions  Potassium replaced   Intervention Category Minor Interventions: Electrolytes abnormality - evaluation and management  Audrielle Vankuren 05/02/2011, 1:25 AM

## 2011-05-02 NOTE — Progress Notes (Signed)
eLink Physician-Brief Progress Note Patient Name: [name] DOB: February 20, 1981 MRN: [id number]  Date of Service  05/02/2011   HPI/Events of Note   Rn says morning low phos was corrected but there is standing order for giving another of kphos  eICU Interventions  Dc current k phos order Recheck bmet, k and phos now and decide on repletion   Intervention Category Intermediate Interventions: Diagnostic test evaluation  Tanieka Pownall 05/02/2011, 5:56 PM

## 2011-05-02 NOTE — Progress Notes (Signed)
eLink Physician-Brief Progress Note Patient Name: [name] DOB: 1980/07/12 MRN: [id number]  Date of Service  05/02/2011   HPI/Events of Note  mag 1.7 Phos 2.5   eICU Interventions  Replete mag   Intervention Category Intermediate Interventions: Electrolyte abnormality - evaluation and management  Efe Fazzino 05/02/2011, 7:54 PM

## 2011-05-02 NOTE — Progress Notes (Signed)
Name: [name] MRN: [id number] DOB: 01-02-1981    LOS: 2  PCCM progress Note  History of Present Illness: 31 yo insulin-dependent diabetic with  History of medical noncompliance brought to [facility] ED with hyperglycemia and altered mental status.  Lines / Drains: 3/8 right IJ CVL>>>  Cultures: 3/8  Blood >> NGTD >>  3/8  Urine >> negative  Antibiotics: 3/8  Ceftriaxone (empirical) >> 3/10  Tests / Events: 3/8  Brought to [facility] ED hyperglycemic, with altered mental status  Subjective Insulin gtt weaned to off, AG 9  Vital Signs: BP 109/81  Pulse 72  Temp(Src) 97.3 F (36.3 C) (Axillary)  Resp 21  Ht 5\' 4"  (1.626 m)  Wt 48.2 kg (106 lb 4.2 oz)  BMI 18.24 kg/m2  SpO2 98%  LMP 03/26/2011 CVP:  [9 mmHg-12 mmHg] 9 mmHg    . DISCONTD: dextrose 5 % and 0.45% NaCl 75 mL/hr at 04/30/11 1623  . DISCONTD: insulin (NOVOLIN-R) infusion 0.7 mL/hr at 05/01/11 1200    Intake/Output Summary (Last 24 hours) at 05/02/11 1015 Last data filed at 05/02/11 1000  Gross per 24 hour  Intake 2435.1 ml  Output   1845 ml  Net  590.1 ml    I/O last 3 completed shifts: In: 4268.8 [P.O.:1260; I.V.:2907.8; IV Piggyback:101] Out: 2550 [Urine:2550] Physical Examination: General:  Awake, comfortable.  Neuro:  Nonfocal, oriented X 3 HEENT:  PERRL, pink conjunctivae,dry membranes Neck:  Supple, no JVD   Cardiovascular:  regular rhythm, no M/R/G Lungs:  Good air movement no W/R/R Abdomen:  Soft, diffusely tender (chronic), nondistended, bowel sounds present Musculoskeletal:  Moves all extremities, no pedal edema Skin:  No rash     Labs and Imaging:  Reviewed.  Please refer to the Assessment and Plan section for relevant results.  ASSESSMENT AND PLAN  ENDOCRINE  Lab 05/02/11 0741 05/02/11 0400 05/01/11 2353 05/01/11 2125 05/01/11 2105 05/01/11 1932 05/01/11 1620 05/01/11 0457  GLUCAP 202* -- -- 236* 267* 245* -- --  CO2 -- 23 21 -- -- -- 20 19  K -- 3.1* 3.0* -- -- -- 3.1* 3.8  A:   DKA, history of insulin dependent DM P: - DKA protocol, insulin gtt off 3/10 - will need to get her insulin pump restarted - will consult DM educator as well  CARDIOVASCULAR  Lab 04/30/11 1545  TROPONINI --  LATICACIDVEN 2.9*  PROBNP --   A: Hypovolemic shock in setting of DKA. Improved  P: -careful with narcs (chronic user) - d/c empiric abx -catapress on hold  NEUROLOGIC A: Acute metabolic Encephalopathy.  Also multiple psychotropic medications (benzodiazepines, opioids) may play a role.  Protects airway. Improved.  P: -careful with narcs >> oxycodone IR + ultram per her home regimen -supportive care - restoril qhs prn  RENAL  Lab 05/02/11 0400 05/01/11 2353 05/01/11 1620 05/01/11 0457 04/30/11 1315  NA 135 133* 131* 136 147*  K 3.1* 3.0* -- -- --  CL 103 102 101 105 109  CO2 23 21 20 19  12*  BUN 5* 6 8 13 22   CREATININE 0.61 0.64 0.62 0.67 1.03  CALCIUM 7.6* 7.5* 7.2* 6.7* 7.7*  MG 1.5 -- -- 1.5 --  PHOS 1.6* -- -- 1.5* --   A:  Metabolic acidosis (AG >30) Mild hyperkalemia, likely secondary to acidosis.  Acute renal failure vs prerenal azotemia. Currently on bicarb gtt. AG to 12 on 3/9.  P: - cont IVFs and IV insulin as AG closes -bicarb gtt stopped - allow water  PO, hold off on calories for now  GASTROINTESTINAL  Lab 05/02/11 0400 04/30/11 0500  AST 51* 169*  ALT 28 60*  ALKPHOS 79 199*  BILITOT 0.2* 0.2*  PROT 4.8* 8.4*  ALBUMIN 2.6* 4.6   A:  Elevated transaminases, alcohol pattern. Marked reflux and h/o gastroparesis. Improved 3/10 P: -PPI and scheduled reglan   HEMATOLOGIC  Lab 05/02/11 0400 05/01/11 0457 04/30/11 0512 04/30/11 0500  HGB 9.7* 9.8* 16.0* 14.2  HCT 27.9* 28.3* 47.0* 44.8  PLT 185 235 -- 746*  INR -- -- -- --  APTT -- -- -- --   A:  Leukocytosis / thrombocytosis, likely reactive.  Hemoconcentration. P: -trend CBC  INFECTIOUS  Lab 05/02/11 0400 05/01/11 0457 04/30/11 1545 04/30/11 0500  WBC 8.8 20.0* -- 39.6*    PROCALCITON -- 0.36 0.57 1.24   A:  Elevated WBC, likely reactive.  No clear source of infection. P: -F/u Blood cx, urine is negative -d/c Empiric Ceftriaxone 3/10   BEST PRACTICE / DISPOSITION -transfer to floor, likely d/c home soon -Full code -carb modified diet -Heparin [place] for DVT Px -->  GI Px is not required -->  Family is not available  Levy Pupa, MD, PhD 05/02/2011, 10:15 AM [facility] [phone] or if no answer [phone]

## 2011-05-02 NOTE — Progress Notes (Signed)
eLink Physician-Brief Progress Note Patient Name: [name] DOB: Dec 24, 1980 MRN: [id number]  Date of Service  05/02/2011   HPI/Events of Note   Hypokalemia and hypophosphatemia  eICU Interventions  Potassium and phos replaced   Intervention Category Intermediate Interventions: Electrolyte abnormality - evaluation and management  Kleo Dungee 05/02/2011, 7:03 AM

## 2011-05-03 DIAGNOSIS — E1010 Type 1 diabetes mellitus with ketoacidosis without coma: Secondary | ICD-10-CM

## 2011-05-03 LAB — BASIC METABOLIC PANEL
BUN: 5 mg/dL — ABNORMAL LOW (ref 6–23)
GFR calc Af Amer: >90 mL/min (ref >90)
GFR calc non Af Amer: >90 mL/min (ref >90)
Potassium: 3.8 mEq/L (ref 3.5–5.1)
Sodium: 136 mEq/L (ref 135–145)

## 2011-05-03 LAB — GLUCOSE, CAPILLARY
Glucose-Capillary: >600 mg/dL (ref 70–99)
Glucose-Capillary: 220 mg/dL — ABNORMAL HIGH (ref 70–99)

## 2011-05-03 MED ORDER — PANTOPRAZOLE SODIUM 40 MG PO TBEC
40.0000 mg | DELAYED_RELEASE_TABLET | Freq: Two times a day (BID) | ORAL | Status: DC
  Administered 2011-05-03 – 2011-05-04 (×2): 40 mg via ORAL
  Filled 2011-05-03 (×4): qty 1

## 2011-05-03 MED ORDER — INSULIN PUMP
Freq: Three times a day (TID) | SUBCUTANEOUS | Status: DC
  Administered 2011-05-03: 17:00:00 via SUBCUTANEOUS
  Administered 2011-05-03: 7.1 via SUBCUTANEOUS
  Administered 2011-05-04: 7 via SUBCUTANEOUS
  Administered 2011-05-04: 08:00:00 via SUBCUTANEOUS
  Filled 2011-05-03: qty 1

## 2011-05-03 NOTE — Progress Notes (Addendum)
Inpatient Diabetes Program Recommendations  AACE/ADA: New Consensus Statement on Inpatient Glycemic Control (2009)  Target Ranges:  Prepandial:   less than 140 mg/dL      Peak postprandial:   less than 180 mg/dL (1-2 hours)      Critically ill patients:  140 - 180 mg/dL   Recommend restarting insulin pump at current settings and monitor over night.  Patient has her supplies but hospital should dispense a new vial of Novolog for patient to refill her pump. Diabetes Coordinator will page Dr Delford Field after 2 PM for orders.    Current pump settings are as follows: 12AM-6AM= 0.6 units/h 6AM-6PM= 1.1 units/h 6PM-12AM=0.9 units/h Total basal 22.2 a day Correction factor is 1:25 and the insulin to CHO ratio is 1:6   Thank You  Piedad Climes RN,BSN,CDE Inpatient Diabetes Coordinator [phone]   Add: 1620 Diabetes Coordinator observed patient refill and apply Omni Pod insulin pump.  Patient bolused 11.5 units for CBG = 393.

## 2011-05-03 NOTE — Progress Notes (Signed)
Name: [name] MRN: [id number] DOB: Aug 08, 1980    LOS: 3  PCCM progress Note  History of Present Illness: 31 yo insulin-dependent diabetic with  History of medical noncompliance brought to [facility] ED with hyperglycemia and altered mental status.  Overnight: Pt still slightly nauseated, not yet on insulin pump. C/o heartburn.   Lines / Drains: 3/8 right IJ CVL>>>out  Cultures: 3/8  Blood >> NGTD >>  3/8  Urine >> negative  Antibiotics: 3/8  Ceftriaxone (empirical) >> 3/10  Tests / Events: 3/8  Brought to [facility] ED hyperglycemic, with altered mental status  Vital Signs: BP 99/65  Pulse 73  Temp(Src) 98.8 F (37.1 C) (Oral)  Resp 18  Ht 5\' 4"  (1.626 m)  Wt 113 lb 11.2 oz (51.574 kg)  BMI 19.52 kg/m2  SpO2 96%  LMP 03/26/2011 CVP:  [6 mmHg-10 mmHg] 6 mmHg    Intake/Output Summary (Last 24 hours) at 05/03/11 1158 Last data filed at 05/03/11 0900  Gross per 24 hour  Intake    770 ml  Output   4500 ml  Net  -3730 ml    I/O last 3 completed shifts: In: 1664 [P.O.:720; I.V.:370; IV Piggyback:574] Out: 6395 [Urine:6395] Physical Examination: General:  Awake, comfortable.  Neuro:  Nonfocal, oriented X 3 HEENT:  PERRL, pink conjunctivae,dry membranes Neck:  Supple, no JVD   Cardiovascular:  regular rhythm, no M/R/G Lungs:  Good air movement no W/R/R Abdomen:  Soft, diffusely tender (chronic), nondistended, bowel sounds present Musculoskeletal:  Moves all extremities, no pedal edema Skin:  No rash     Labs and Imaging:    ASSESSMENT AND PLAN  ENDOCRINE  Lab 05/03/11 1146 05/03/11 0750 05/03/11 0500 05/02/11 2158 05/02/11 1833 05/02/11 1637 05/02/11 0400 05/01/11 2353  GLUCAP 244* 220* -- 277* -- 379* -- --  CO2 -- -- 31 -- 20 -- 23 21  K -- -- 3.8 -- 3.8 -- 3.1* 3.0*  A:  DKA, history of insulin dependent DM P: - DKA protocol, insulin gtt off 3/10 - will need to get her insulin pump restarted -now have orders for insulin  Pump. Will order same this  PM  -  DM educator consult seen and reviewed.  -Spoke with Dr. Laverle Patter endocrinology) 3/11 12n (SM. ) His records indicate a insulin rate of 0.9 units an hour with a carb calculation of 0.1 unit insulin per 6 gms of carbs.  We will need to dc ssi and lantus when pump begins and check cbgs q 4 h. She has appointment to see Dr. Sharl Ma at 1330 3/12. Will hopefully be able to d/c home in am and have pt keep 05/04/11 appt with Sharl Ma. Pt has transportation issues.  CARDIOVASCULAR  Lab 04/30/11 1545  TROPONINI --  LATICACIDVEN 2.9*  PROBNP --   A: Hypovolemic shock in setting of DKA. Improved >>>resolved P: -careful with narcs (chronic user) - d/c empiric abx -catapress on hold  NEUROLOGIC A: Acute metabolic Encephalopathy.  Also multiple psychotropic medications (benzodiazepines, opioids) may play a role.  Protects airway.   Resolved P: -careful with narcs >> oxycodone IR + ultram per her home regimen -supportive care - restoril qhs prn  RENAL  Lab 05/03/11 0500 05/02/11 1833 05/02/11 0400 05/01/11 2353 05/01/11 1620 05/01/11 0457  NA 136 133* 135 133* 131* --  K 3.8 3.8 -- -- -- --  CL 96 96 103 102 101 --  CO2 31 20 23 21 20  --  BUN 5* 4* 5* 6 8 --  CREATININE 0.58 0.60 0.61 0.64 0.62 --  CALCIUM 9.0 9.0 7.6* 7.5* 7.2* --  MG 2.0 1.7 1.5 -- -- 1.5  PHOS 2.9 2.5 1.6* -- -- 1.5*   A:  Metabolic acidosis (AG >30) Mild hyperkalemia, likely secondary to acidosis.  Acute renal failure vs prerenal azotemia.  NOW resolved 05/03/11.    P: -d/c ivfs -bicarb gtt stopped - low carb diet  GASTROINTESTINAL  Lab 05/02/11 0400 04/30/11 0500  AST 51* 169*  ALT 28 60*  ALKPHOS 79 199*  BILITOT 0.2* 0.2*  PROT 4.8* 8.4*  ALBUMIN 2.6* 4.6   A:  Elevated transaminases, alcohol pattern. Marked reflux and h/o gastroparesis. Improved 3/10 P: - BID PPI and scheduled reglan   HEMATOLOGIC  Lab 05/02/11 0400 05/01/11 0457 04/30/11 0512 04/30/11 0500  HGB 9.7* 9.8* 16.0* 14.2   HCT 27.9* 28.3* 47.0* 44.8  PLT 185 235 -- 746*  INR -- -- -- --  APTT -- -- -- --   A:  Leukocytosis / thrombocytosis, likely reactive.  Hemoconcentration. P: -trend CBC  INFECTIOUS  Lab 05/02/11 0400 05/01/11 0457 04/30/11 1545 04/30/11 0500  WBC 8.8 20.0* -- 39.6*  PROCALCITON -- 0.36 0.57 1.24   A:  Elevated WBC, likely reactive.  No clear source of infection. P: -F/u Blood cx, urine is negative -d/c Empiric Ceftriaxone 3/10   BEST PRACTICE / DISPOSITION - floor, likely d/c home soon once insulin pump clarified. -Full code -carb modified diet -Heparin [place] for DVT Px -->  GI Px is not required -->  Family is not available  Brett Canales Minor ACNP [facility] PCCM Pager [phone] till 3 pm If no answer page [phone]  I have seen and examined this patient with the nurse practionner and agree with the above assessment and plan.  I have made notations to the exceptions.  Shan Levans Beeper  [phone]  Cell  [phone]  If no response or cell goes to voicemail, call beeper [phone]   05/03/2011, 12:02 PM

## 2011-05-04 LAB — BASIC METABOLIC PANEL
BUN: 8 mg/dL (ref 6–23)
CO2: 32 mEq/L (ref 19–32)
Chloride: 98 mEq/L (ref 96–112)
Glucose, Bld: 200 mg/dL — ABNORMAL HIGH (ref 70–99)
Potassium: 3.6 mEq/L (ref 3.5–5.1)
Sodium: 136 mEq/L (ref 135–145)

## 2011-05-04 LAB — GLUCOSE, CAPILLARY
Glucose-Capillary: 275 mg/dL — ABNORMAL HIGH (ref 70–99)
Glucose-Capillary: 202 mg/dL — ABNORMAL HIGH (ref 70–99)
Glucose-Capillary: 165 mg/dL — ABNORMAL HIGH (ref 70–99)

## 2011-05-04 MED ORDER — METOCLOPRAMIDE HCL 5 MG PO TABS: 5.0000 mg | ORAL_TABLET | Freq: Four times a day (QID) | ORAL | Status: DC

## 2011-05-04 MED ORDER — ONDANSETRON HCL 8 MG PO TABS: 8.0000 mg | ORAL_TABLET | Freq: Three times a day (TID) | ORAL | Status: AC | PRN

## 2011-05-04 NOTE — Discharge Summary (Signed)
Physician Discharge Summary  Patient ID: [name] MRN: [id number] DOB/AGE: 1980/03/02 31 y.o.  Admit date: 04/30/2011 Discharge date: 05/04/2011 HPI:  31 yo insulin-dependent diabetic with History of medical noncompliance brought to [facility] ED with hyperglycemia and altered mental status.     Hospital Course: Lines / Drains:  3/8 right IJ CVL>>>out  Cultures:  3/8 Blood >> NGTD >>  3/8 Urine >> negative  Antibiotics:  3/8 Ceftriaxone (empirical) >> 3/10  Tests / Events:  3/8 Brought to [facility] ED hyperglycemic, with altered mental status  Vital Signs:  BP 99/65  Pulse 73  Temp(Src) 98.8 F (37.1 C) (Oral)  Resp 18  Ht 5\' 4"  (1.626 m)  Wt 113 lb 11.2 oz (51.574 kg)  BMI 19.52 kg/m2  SpO2 96%  LMP 03/26/2011  CVP: [6 mmHg-10 mmHg] 6 mmHg    Intake/Output Summary (Last 24 hours) at 05/03/11 1158 Last data filed at 05/03/11 0900   Gross per 24 hour   Intake  770 ml   Output  4500 ml   Net  -3730 ml    I/O last 3 completed shifts:  In: 1664 [P.O.:720; I.V.:370; IV Piggyback:574]  Out: 6395 [Urine:6395]  Physical Examination:  General: Awake, comfortable.  Neuro: Nonfocal, oriented X 3  HEENT: PERRL, pink conjunctivae,dry membranes  Neck: Supple, no JVD  Cardiovascular: regular rhythm, no M/R/G  Lungs: Good air movement no W/R/R  Abdomen: Soft, diffusely tender (chronic), nondistended, bowel sounds present  Musculoskeletal: Moves all extremities, no pedal edema  Skin: No rash   Labs and Imaging:  ASSESSMENT AND PLAN  ENDOCRINE   Lab  05/03/11 1146  05/03/11 0750  05/03/11 0500  05/02/11 2158  05/02/11 1833  05/02/11 1637  05/02/11 0400  05/01/11 2353   GLUCAP  244*  220*  --  277*  --  379*  --  --   CO2  --  --  31  --  20  --  23  21   K  --  --  3.8  --  3.8  --  3.1*  3.0*   A: DKA, history of insulin dependent DM  P:  - DKA protocol, insulin gtt off 3/10  - will need to get her insulin pump restarted  -now have orders for insulin Pump. Will order same  this PM  - DM educator consult seen and reviewed.  -Spoke with Dr. Laverle Patter endocrinology) 3/11 12n (SM. )  His records indicate a insulin rate of 0.9 units an hour with a carb calculation of 0.1 unit insulin per 6 gms of carbs.  We will need to dc ssi and lantus when pump begins and check cbgs q 4 h. She has appointment to see Dr. Sharl Ma at 1330 3/12. Will hopefully be able to d/c home in am and have pt keep 05/04/11 appt with Sharl Ma. Pt has transportation issues.  CARDIOVASCULAR   Lab  04/30/11 1545   TROPONINI  --   LATICACIDVEN  2.9*   PROBNP  --    A: Hypovolemic shock in setting of DKA. Improved >>>resolved  P:  -careful with narcs (chronic user)  - d/c empiric abx  -catapress on hold  NEUROLOGIC  A: Acute metabolic Encephalopathy. Also multiple psychotropic medications (benzodiazepines, opioids) may play a role. Protects airway. Resolved  P:  -careful with narcs >> oxycodone IR + ultram per her home regimen  -supportive care  - restoril qhs prn  RENAL   Lab  05/03/11 0500  05/02/11 1833  05/02/11  0400  05/01/11 2353  05/01/11 1620  05/01/11 0457   NA  136  133*  135  133*  131*  --   K  3.8  3.8  --  --  --  --   CL  96  96  103  102  101  --   CO2  31  20  23  21  20   --   BUN  5*  4*  5*  6  8  --   CREATININE  0.58  0.60  0.61  0.64  0.62  --   CALCIUM  9.0  9.0  7.6*  7.5*  7.2*  --   MG  2.0  1.7  1.5  --  --  1.5   PHOS  2.9  2.5  1.6*  --  --  1.5*    A: Metabolic acidosis (AG >30) Mild hyperkalemia, likely secondary to acidosis. Acute renal failure vs prerenal azotemia. NOW resolved 05/03/11.  P:  -d/c ivfs  -bicarb gtt stopped  - low carb diet  GASTROINTESTINAL   Lab  05/02/11 0400  04/30/11 0500   AST  51*  169*   ALT  28  60*   ALKPHOS  79  199*   BILITOT  0.2*  0.2*   PROT  4.8*  8.4*   ALBUMIN  2.6*  4.6    A: Elevated transaminases, alcohol pattern. Marked reflux and h/o gastroparesis. Improved 3/10  P:  - BID PPI and scheduled reglan    HEMATOLOGIC   Lab  05/02/11 0400  05/01/11 0457  04/30/11 0512  04/30/11 0500   HGB  9.7*  9.8*  16.0*  14.2   HCT  27.9*  28.3*  47.0*  44.8   PLT  185  235  --  746*   INR  --  --  --  --   APTT  --  --  --  --    A: Leukocytosis / thrombocytosis, likely reactive. Hemoconcentration.  P:  -trend CBC  INFECTIOUS   Lab  05/02/11 0400  05/01/11 0457  04/30/11 1545  04/30/11 0500   WBC  8.8  20.0*  --  39.6*   PROCALCITON  --  0.36  0.57  1.24    A: Elevated WBC, likely reactive. No clear source of infection.  P:  -F/u Blood cx, urine is negative  -d/c Empiric Ceftriaxone 3/10   05/04/11 Discharged home in improved condition, on her insulin pump with a follow up appointment at 1330 hrs today.     Labs at discharge Lab Results  Component Value Date   CREATININE 0.61 05/04/2011   BUN 8 05/04/2011   NA 136 05/04/2011   K 3.6 05/04/2011   CL 98 05/04/2011   CO2 32 05/04/2011   Lab Results  Component Value Date   WBC 8.8 05/02/2011   HGB 9.7* 05/02/2011   HCT 27.9* 05/02/2011   MCV 91.2 05/02/2011   PLT 185 05/02/2011   Lab Results  Component Value Date   ALT 28 05/02/2011   AST 51* 05/02/2011   ALKPHOS 79 05/02/2011   BILITOT 0.2* 05/02/2011   Lab Results  Component Value Date   INR 1.29 04/20/2011   INR 1.0 02/07/2007   INR 1.2 02/04/2007    Current radiology studies No results found.  Disposition:  01-Home or Self Care  Discharge Orders    Future Orders Please Complete By Expires   Discharge patient      Comments:  Dc iv     Medication List  As of 05/04/2011 11:08 AM   STOP taking these medications         cloNIDine 0.1 MG tablet      LORazepam 1 MG tablet      oxyCODONE 5 MG immediate release tablet         TAKE these medications         ALPRAZolam 1 MG tablet   Commonly known as: XANAX   Take 0.5-1 mg by mouth 3 (three) times daily as needed. For anxiety.      insulin pump 100 unit/ml Soln   Inject into the skin continuous. Humalog insulin  pump, Patient is DKA and cannot communicate settings.      metoCLOPramide 5 MG tablet   Commonly known as: REGLAN   Take 1 tablet (5 mg total) by mouth 4 (four) times daily.      mulitivitamin with minerals Tabs   Take 1 tablet by mouth daily.      omeprazole 20 MG capsule   Commonly known as: PRILOSEC   Take 20 mg by mouth daily.      ondansetron 8 MG tablet   Commonly known as: ZOFRAN   Take 1 tablet (8 mg total) by mouth every 8 (eight) hours as needed for nausea.      temazepam 15 MG capsule   Commonly known as: RESTORIL   Take 15 mg by mouth at bedtime as needed. Insomnia      traMADol 50 MG tablet   Commonly known as: ULTRAM   Take 25-50 mg by mouth every 8 (eight) hours as needed. For pain.           Follow-up Information    Follow up with KERR,JEFFREY, MD on 05/04/2011. (1:30 pm)    Contact information:   [address] [place] [facility] [place] [address] [phone]          Discharged Condition: good  Signed: Brett Canales Minor ACNP Adolph Pollack PCCM Pager [phone] till 3 pm If no answer page [phone] 05/04/2011, 11:08 AM   Shan Levans I have seen and examined this patient with the nurse practionner and agree with the above assessment and plan.  I have made notations to the exceptions.   Beeper  [phone]  Cell  [phone]  If no response or cell goes to voicemail, call beeper [phone]

## 2011-05-04 NOTE — Progress Notes (Signed)
PT'S CBG 275, SHE GAVE HERSELF 7.0 UNIT BOLUS.

## 2011-05-04 NOTE — Progress Notes (Signed)
CBG WAS 278, PT GAVE HERSELF 7.10 INSULIN BOLUS.  AT 0000, PT TOOK CBG WITH HER OWN METER, IT WAS 77, WE GAVE HER 15 MG CARB, AND PT THEN SUSPENDED  HER INSULIN BASAL RATE FOR 30 MINUTES.

## 2011-05-04 NOTE — Progress Notes (Signed)
REady for release. See full d/c note to follow.  Send out on prilosec 20mg  bid AC plus zantac 300mg  qhs Give #20 reglan 5mg  tid ac and qhs Insulin pump as is DO NOT RESTART clonidene Refill zofran Pt will need to keep appt with Dr Sharl Ma at 130pm today. Confirm she has this appt. Pt has a ride to Drs office at [facility] [employer].  Shan Levans Beeper  [phone]  Cell  [phone]  If no response or cell goes to voicemail, call beeper [phone]

## 2011-05-06 LAB — CULTURE, BLOOD (ROUTINE X 2)
Culture  Setup Time: 201303082055
Culture  Setup Time: 201303082056
Culture: NO GROWTH

## 2011-05-12 LAB — GLUCOSE, CAPILLARY: Glucose-Capillary: 411 mg/dL — ABNORMAL HIGH (ref 70–99)

## 2011-10-18 ENCOUNTER — Emergency Department (HOSPITAL_COMMUNITY): Payer: Medicaid Other

## 2011-10-18 ENCOUNTER — Inpatient Hospital Stay (HOSPITAL_COMMUNITY)
Admission: EM | Admit: 2011-10-18 | Discharge: 2011-10-22 | DRG: 637 | Disposition: A | Discharge status: Home / Self Care | Payer: Medicaid Other | Attending: Internal Medicine | Admitting: Internal Medicine

## 2011-10-18 ENCOUNTER — Encounter (HOSPITAL_COMMUNITY): Payer: Self-pay | Admitting: Emergency Medicine

## 2011-10-18 DIAGNOSIS — N179 Acute kidney failure, unspecified: Secondary | ICD-10-CM | POA: Diagnosis present

## 2011-10-18 DIAGNOSIS — E1110 Type 2 diabetes mellitus with ketoacidosis without coma: Secondary | ICD-10-CM

## 2011-10-18 DIAGNOSIS — E1010 Type 1 diabetes mellitus with ketoacidosis without coma: Principal | ICD-10-CM | POA: Diagnosis present

## 2011-10-18 DIAGNOSIS — D649 Anemia, unspecified: Secondary | ICD-10-CM

## 2011-10-18 DIAGNOSIS — R4182 Altered mental status, unspecified: Secondary | ICD-10-CM

## 2011-10-18 DIAGNOSIS — E871 Hypo-osmolality and hyponatremia: Secondary | ICD-10-CM | POA: Diagnosis present

## 2011-10-18 DIAGNOSIS — G9341 Metabolic encephalopathy: Secondary | ICD-10-CM | POA: Diagnosis present

## 2011-10-18 DIAGNOSIS — E860 Dehydration: Secondary | ICD-10-CM

## 2011-10-18 LAB — GLUCOSE, CAPILLARY
Glucose-Capillary: 147 mg/dL — ABNORMAL HIGH (ref 70–99)
Glucose-Capillary: 485 mg/dL — ABNORMAL HIGH (ref 70–99)
Glucose-Capillary: 515 mg/dL — ABNORMAL HIGH (ref 70–99)

## 2011-10-18 LAB — CBC
HCT: 31.7 % — ABNORMAL LOW (ref 36.0–46.0)
MCHC: 34.7 g/dL (ref 30.0–36.0)
Platelets: 490 10*3/uL — ABNORMAL HIGH (ref 150–400)
Platelets: 358 10*3/uL (ref 150–400)
RBC: 3.83 MIL/uL — ABNORMAL LOW (ref 3.87–5.11)
RDW: 13.1 % (ref 11.5–15.5)
RDW: 12.9 % (ref 11.5–15.5)
WBC: 39.1 10*3/uL — ABNORMAL HIGH (ref 4.0–10.5)
WBC: 35.2 10*3/uL — ABNORMAL HIGH (ref 4.0–10.5)

## 2011-10-18 LAB — BASIC METABOLIC PANEL
CO2: <7 mEq/L — CL (ref 19–32)
CO2: 11 mEq/L — ABNORMAL LOW (ref 19–32)
Calcium: 7.8 mg/dL — ABNORMAL LOW (ref 8.4–10.5)
Calcium: 8.2 mg/dL — ABNORMAL LOW (ref 8.4–10.5)
Chloride: 96 mEq/L (ref 96–112)
Chloride: 100 mEq/L (ref 96–112)
Creatinine, Ser: 1.48 mg/dL — ABNORMAL HIGH (ref 0.50–1.10)
Creatinine, Ser: 1.07 mg/dL (ref 0.50–1.10)
GFR calc Af Amer: 54 mL/min — ABNORMAL LOW (ref >90)
GFR calc Af Amer: 59 mL/min — ABNORMAL LOW (ref >90)
GFR calc Af Amer: 70 mL/min — ABNORMAL LOW (ref >90)
GFR calc Af Amer: 79 mL/min — ABNORMAL LOW (ref >90)
GFR calc non Af Amer: 46 mL/min — ABNORMAL LOW (ref >90)
GFR calc non Af Amer: 51 mL/min — ABNORMAL LOW (ref >90)
GFR calc non Af Amer: 61 mL/min — ABNORMAL LOW (ref >90)
Potassium: 3.3 mEq/L — ABNORMAL LOW (ref 3.5–5.1)
Potassium: 3.4 mEq/L — ABNORMAL LOW (ref 3.5–5.1)
Sodium: 127 mEq/L — ABNORMAL LOW (ref 135–145)
Sodium: 133 mEq/L — ABNORMAL LOW (ref 135–145)
Sodium: 135 mEq/L (ref 135–145)
Sodium: 137 mEq/L (ref 135–145)

## 2011-10-18 LAB — URINALYSIS, ROUTINE W REFLEX MICROSCOPIC
Glucose, UA: >1000 mg/dL — AB
Hgb urine dipstick: NEGATIVE
Ketones, ur: >80 mg/dL — AB
Leukocytes, UA: NEGATIVE
pH: 5 (ref 5.0–8.0)

## 2011-10-18 LAB — CARBOXYHEMOGLOBIN
Carboxyhemoglobin: 1.4 % (ref 0.5–1.5)
Carboxyhemoglobin: 1.3 % (ref 0.5–1.5)
Methemoglobin: 1.8 % — ABNORMAL HIGH (ref 0.0–1.5)
Methemoglobin: 2.4 % — ABNORMAL HIGH (ref 0.0–1.5)
O2 Saturation: 86.8 %
O2 Saturation: 84.3 %
Total hemoglobin: 11.1 g/dL — ABNORMAL LOW (ref 12.0–16.0)
Total hemoglobin: 11 g/dL — ABNORMAL LOW (ref 12.0–16.0)

## 2011-10-18 LAB — COMPREHENSIVE METABOLIC PANEL
ALT: 17 U/L (ref 0–35)
AST: 20 U/L (ref 0–37)
Albumin: 4.7 g/dL (ref 3.5–5.2)
CO2: <7 mEq/L — CL (ref 19–32)
Chloride: 66 mEq/L — ABNORMAL LOW (ref 96–112)
Creatinine, Ser: 1.62 mg/dL — ABNORMAL HIGH (ref 0.50–1.10)
Potassium: 5.1 mEq/L (ref 3.5–5.1)
Sodium: 119 mEq/L — CL (ref 135–145)
Total Bilirubin: 0.2 mg/dL — ABNORMAL LOW (ref 0.3–1.2)

## 2011-10-18 LAB — KETONES, QUALITATIVE

## 2011-10-18 LAB — URINE MICROSCOPIC-ADD ON

## 2011-10-18 LAB — RAPID URINE DRUG SCREEN, HOSP PERFORMED
Amphetamines: NOT DETECTED
Barbiturates: NOT DETECTED
Benzodiazepines: NOT DETECTED
Cocaine: NOT DETECTED

## 2011-10-18 MED ORDER — VANCOMYCIN HCL IN DEXTROSE 1-5 GM/200ML-% IV SOLN
1000.0000 mg | INTRAVENOUS | Status: DC
  Filled 2011-10-18 (×2): qty 200

## 2011-10-18 MED ORDER — DEXTROSE-NACL 5-0.45 % IV SOLN
INTRAVENOUS | Status: DC
  Administered 2011-10-18: 150 mL/h via INTRAVENOUS

## 2011-10-18 MED ORDER — LORAZEPAM 2 MG/ML IJ SOLN
1.0000 mg | Freq: Once | INTRAMUSCULAR | Status: AC
  Administered 2011-10-18: 1 mg via INTRAMUSCULAR

## 2011-10-18 MED ORDER — SODIUM CHLORIDE 0.9 % IV SOLN: INTRAVENOUS | Status: DC

## 2011-10-18 MED ORDER — HEPARIN SODIUM (PORCINE) 5000 UNIT/ML IJ SOLN
5000.0000 [IU] | Freq: Three times a day (TID) | INTRAMUSCULAR | Status: DC
  Administered 2011-10-18 – 2011-10-19 (×3): 5000 [IU] via SUBCUTANEOUS
  Filled 2011-10-18 (×6): qty 1

## 2011-10-18 MED ORDER — SODIUM CHLORIDE 0.9 % IV BOLUS (SEPSIS)
1000.0000 mL | Freq: Once | INTRAVENOUS | Status: AC
  Administered 2011-10-19: 1000 mL via INTRAVENOUS

## 2011-10-18 MED ORDER — SODIUM CHLORIDE 0.9 % IV BOLUS (SEPSIS)
1000.0000 mL | INTRAVENOUS | Status: AC
  Administered 2011-10-18: 1000 mL via INTRAVENOUS

## 2011-10-18 MED ORDER — SODIUM CHLORIDE 0.9 % IV SOLN
INTRAVENOUS | Status: DC
  Administered 2011-10-18: 7 [IU]/h via INTRAVENOUS
  Filled 2011-10-18 (×2): qty 1

## 2011-10-18 MED ORDER — SODIUM CHLORIDE 0.9 % IV SOLN
INTRAVENOUS | Status: DC
  Administered 2011-10-18: 200 mL/h via INTRAVENOUS
  Administered 2011-10-18: 200 mL via INTRAVENOUS

## 2011-10-18 MED ORDER — HEPARIN SODIUM (PORCINE) 5000 UNIT/ML IJ SOLN: 5000.0000 [IU] | Freq: Three times a day (TID) | INTRAMUSCULAR | Status: DC

## 2011-10-18 MED ORDER — LORAZEPAM 2 MG/ML IJ SOLN
INTRAMUSCULAR | Status: AC
  Administered 2011-10-18: 1 mg via INTRAMUSCULAR
  Filled 2011-10-18: qty 1

## 2011-10-18 MED ORDER — PANTOPRAZOLE SODIUM 40 MG IV SOLR
40.0000 mg | INTRAVENOUS | Status: DC
  Filled 2011-10-18: qty 40

## 2011-10-18 MED ORDER — SODIUM CHLORIDE 0.9 % IV SOLN
INTRAVENOUS | Status: DC
  Administered 2011-10-18: 5.4 [IU]/h via INTRAVENOUS
  Filled 2011-10-18: qty 1

## 2011-10-18 MED ORDER — SODIUM CHLORIDE 0.9 % IV SOLN: 250.0000 mL | INTRAVENOUS | Status: DC | PRN

## 2011-10-18 MED ORDER — SODIUM CHLORIDE 0.9 % IV SOLN: INTRAVENOUS | Status: AC

## 2011-10-18 MED ORDER — DEXTROSE 50 % IV SOLN: 25.0000 mL | INTRAVENOUS | Status: DC | PRN

## 2011-10-18 MED ORDER — POTASSIUM CHLORIDE 10 MEQ/50ML IV SOLN
10.0000 meq | INTRAVENOUS | Status: AC
  Administered 2011-10-19 (×5): 10 meq via INTRAVENOUS
  Filled 2011-10-18: qty 250

## 2011-10-18 MED ORDER — DEXTROSE 5 % IV SOLN
2.0000 g | INTRAVENOUS | Status: DC
  Administered 2011-10-18: 2 g via INTRAVENOUS
  Filled 2011-10-18 (×2): qty 2

## 2011-10-18 NOTE — ED Notes (Signed)
Dr. Power setting up central line, Press photographer, NTs, RNs, and Engineer, materials at bedside.

## 2011-10-18 NOTE — ED Notes (Signed)
4 point soft restraint placed, EDP awared.

## 2011-10-18 NOTE — ED Notes (Signed)
ZOX:WRUE<AV> Expected date:<BR> Expected time:<BR> Means of arrival:<BR> Comments:<BR> Hypoglycemic combative

## 2011-10-18 NOTE — ED Notes (Signed)
Pt remains combative, remains restraint, Engineer, materials, NTs, RNs and Dr. Power at bedside.

## 2011-10-18 NOTE — ED Provider Notes (Signed)
History     CSN: [id number]  Arrival date & time 10/18/11  1245   First MD Initiated Contact with Patient 10/18/11 1248      Chief Complaint  Patient presents with  . Hyperglycemia    (Consider location/radiation/quality/duration/timing/severity/associated sxs/prior treatment) HPI Comments: Ms. Washinton presents for evaluation from home for evaluation of mental status changes.  Her significant other related to EMS that she had been altered and combative since some time last night.  Per EMS she has not responded in a purposeful way to voice commands and has been moving and thrashing since the beginning of their encounter.  They report that her blood sugar ws too high to be read on their device.  Patient is a 31 y.o. female presenting with altered mental status. The history is provided by the EMS personnel. The history is limited by the condition of the patient (pt is altered, thrashing wildly, not follow-ing commands.).  Altered Mental Status This is a new problem. Episode onset: "Since last night" The problem occurs constantly. The problem has not changed since onset.   Past Medical History  Diagnosis Date  . Diabetes mellitus   . Thyroid disease   . Back pain   . Panic attack   . PONV (postoperative nausea and vomiting)   . Anxiety   . GERD (gastroesophageal reflux disease)     Past Surgical History  Procedure Date  . Laparoscopy     age 34  . Back surgery     age 53    No family history on file.  History  Substance Use Topics  . Smoking status: Former Smoker    Quit date: 04/19/2000  . Smokeless tobacco: Former Neurosurgeon  . Alcohol Use: Yes     social    OB History    Grav Para Term Preterm Abortions TAB SAB Ect Mult Living                  Review of Systems  Unable to perform ROS Psychiatric/Behavioral: Positive for altered mental status.    Allergies  Scallops and Morphine and related  Home Medications   Current Outpatient Rx  Name Route Sig Dispense  Refill  . ALPRAZOLAM 1 MG PO TABS Oral Take 0.5-1 mg by mouth 3 (three) times daily as needed. For anxiety.    . INSULIN PUMP Subcutaneous Inject into the skin continuous. Humalog insulin pump, Patient is DKA and cannot communicate settings.    . ADULT MULTIVITAMIN W/MINERALS CH Oral Take 1 tablet by mouth daily.    Marland Kitchen OMEPRAZOLE 20 MG PO CPDR Oral Take 20 mg by mouth daily.    Marland Kitchen TEMAZEPAM 15 MG PO CAPS Oral Take 15 mg by mouth at bedtime as needed. Insomnia    . TRAMADOL HCL 50 MG PO TABS Oral Take 25-50 mg by mouth every 8 (eight) hours as needed. For pain.      There were no vitals taken for this visit.  Physical Exam  Nursing note and vitals reviewed. Constitutional: She appears listless. She is uncooperative. She appears ill. No distress. She is not intubated.       Pt appears thin and acutely ill.  Note old linear abrasions on left arm and multiple piercings.  HENT:  Head: Normocephalic and atraumatic. Head is without raccoon's eyes, without Battle's sign and without contusion.  Right Ear: Tympanic membrane and ear canal normal. No mastoid tenderness. Tympanic membrane is not bulging. No hemotympanum.  Left Ear: Hearing, tympanic membrane, external ear  and ear canal normal. No mastoid tenderness. Tympanic membrane is not bulging. No hemotympanum.  Eyes: Conjunctivae and lids are normal. Pupils are equal, round, and reactive to light. Right eye exhibits no discharge. Left eye exhibits no discharge. Right conjunctiva is not injected. Right conjunctiva has no hemorrhage. Left conjunctiva is not injected. Left conjunctiva has no hemorrhage. No scleral icterus. Right pupil is round and reactive. Left pupil is round and reactive. Pupils are equal.  Neck: Trachea normal and normal range of motion. Normal carotid pulses and no JVD present. No tracheal tenderness present. Carotid bruit is not present. No tracheal deviation present. No mass and no thyromegaly present.  Cardiovascular: Regular rhythm,  S1 normal, S2 normal, normal heart sounds, intact distal pulses and normal pulses.   No extrasystoles are present. Tachycardia present.  PMI is not displaced.  Exam reveals no gallop, no distant heart sounds, no friction rub and no decreased pulses.   No murmur heard. Pulmonary/Chest: Breath sounds normal. No accessory muscle usage or stridor. Tachypnea noted. No apnea and not bradypneic. She is not intubated. No respiratory distress. She exhibits no mass, no crepitus, no edema, no deformity, no swelling and no retraction.  Abdominal: Normal appearance. She exhibits no distension, no abdominal bruit, no pulsatile midline mass and no mass. Bowel sounds are decreased. There is no hepatosplenomegaly. There is no tenderness. There is no rebound and no CVA tenderness. No hernia.  Musculoskeletal:       No obvious deformities.  Lymphadenopathy:    She has no cervical adenopathy.  Neurological: She appears listless. GCS eye subscore is 2. GCS verbal subscore is 4. GCS motor subscore is 5.  Skin: Skin is warm, dry and intact. She is not diaphoretic. No pallor.    ED Course  CENTRAL LINE Date/Time: 10/18/2011 2:48 PM Performed by: Dana Allan T Authorized by: Dana Allan T Consent: Verbal consent not obtained. Written consent not obtained. The procedure was performed in an emergent situation. Risks and benefits discussed: pt is altered, thrashing, unable to receive or understand instructions. Indications: vascular access Anesthesia: local infiltration Local anesthetic: lidocaine 1% without epinephrine Skin prep agent dried: skin prep agent completely dried prior to procedure Maximum sterile barriers used during central venous catheter insertion: attempted using appropriate drapes however pt actively thrashing even after receiving ativan.  pt restrained  by multiple providers during procedure. Hand hygiene: hand hygiene performed prior to central venous catheter insertion Location details: right  internal jugular Patient position: reverse Trendelenburg Catheter type: triple lumen Catheter size: 7.5 Fr Pre-procedure: landmarks identified Ultrasound guidance: yes Number of attempts: 1 Successful placement: yes Post-procedure: line sutured and dressing applied Assessment: blood return through all parts, free fluid flow and placement verified by x-ray Patient tolerance: Patient tolerated the procedure well with no immediate complications. Comments: Concerned secondary to the less than ideal conditions under which the line was placed.  This line was not placed under complete sterile precautions.  Pt appears acutely ill and all other avenues for IV access have proven unsuccessful.  Will use the line now during the acute phase of her evaluation.  Will alert the admitting team that this line should be removed ASAP.   (including critical care time)   Labs Reviewed  CBC  COMPREHENSIVE METABOLIC PANEL  PREGNANCY, URINE  URINALYSIS, ROUTINE W REFLEX MICROSCOPIC  BLOOD GAS, ARTERIAL  KETONES, QUALITATIVE  URINE RAPID DRUG SCREEN (HOSP PERFORMED)  ETHANOL   No results found.   No diagnosis found.    MDM  Pt presents via EMS for evaluation of altered mental status.  She has ahx of DM and her blood sugar reading was high (unable to give value) prior to arrival.  Pt is currently altered and combative with an elevated respiratory rate.  Plan establish IV access, obtain comprehensive labs, tox screen, and an ABG.  Will start IVF.  Pt has significant tachycardia and tachypnea.  Anticipate pt may have DKA.    1450.  Central line placed.  Placement confirmed by U/S, blood return, and CXR.  No PNA identified.  IVF are infusing.  Plan bolus @L  NS then continue infusion at 153ml/hr.  Note pH = 6.99.  Low serum bicarb.  High blood sugar.  Insulin gtt initiatted.  Pt is afebrile.  Contacted the on-call intensivist provider for evaluation here in the ER and admission to the ICU secondary to DKA with  altered mental status.  CRITICAL CARE Performed by: Dana Allan T   Total critical care time: 40+  Critical care time was exclusive of separately billable procedures and treating other patients.  Critical care was necessary to treat or prevent imminent or life-threatening deterioration.  Critical care was time spent personally by me on the following activities: development of treatment plan with patient and/or surrogate as well as nursing, discussions with consultants, evaluation of patient's response to treatment, examination of patient, obtaining history from patient or surrogate, ordering and performing treatments and interventions, ordering and review of laboratory studies, ordering and review of radiographic studies, pulse oximetry and re-evaluation of patient's condition.      Tobin Chad, MD 10/18/11 1500

## 2011-10-18 NOTE — ED Notes (Signed)
Boyfriend at bedside, pt is calm at the moment.

## 2011-10-18 NOTE — ED Notes (Signed)
Attempt to call report to ICU, receiving RN not available.

## 2011-10-18 NOTE — ED Notes (Signed)
Per EMS CBG reading was HIGH. SO states that she has been like this all morning. Hx of the DM and noncompliance.

## 2011-10-18 NOTE — Progress Notes (Signed)
ANTIBIOTIC CONSULT NOTE - INITIAL  Pharmacy Consult for Vancomycin Indication: empirically  Allergies  Allergen Reactions  . Scallops (Shellfish Allergy) Anaphylaxis  . Morphine And Related Itching    Patient Measurements:   Adjusted Body Weight:   Vital Signs: Temp: 97.1 F (36.2 C) (08/26 1422) Temp src: Core (Comment) (08/26 1422) BP: 112/66 mmHg (08/26 1422) Pulse Rate: 122  (08/26 1422) Intake/Output from previous day:   Intake/Output from this shift:    Labs:  Basename 10/18/11 1305  WBC 39.1*  HGB 12.5  PLT 490*  LABCREA --  CREATININE 1.62*   The CrCl is unknown because both a height and weight (above a minimum accepted value) are required for this calculation. No results found for this basename: VANCOTROUGH:2,VANCOPEAK:2,VANCORANDOM:2,GENTTROUGH:2,GENTPEAK:2,GENTRANDOM:2,TOBRATROUGH:2,TOBRAPEAK:2,TOBRARND:2,AMIKACINPEAK:2,AMIKACINTROU:2,AMIKACIN:2, in the last 72 hours   Microbiology: No results found for this or any previous visit (from the past 720 hour(s)).  Medical History: Past Medical History  Diagnosis Date  . Diabetes mellitus   . Thyroid disease   . Back pain   . Panic attack   . PONV (postoperative nausea and vomiting)   . Anxiety   . GERD (gastroesophageal reflux disease)    Assessment:  37 yof with h/o DM presented with AMS, hyperglycemia, ARF.  CCM on board, attempted central line placement, somewhat dirty line so would like prophylactic/empiric Vancomycin on board for now.  Anticipate discontinuation in near future.  Ceftriaxone also on board.  Last weight recorded 51.6 kg, Scr 1.62, CG CrCl 41 ml/min, 57 ml/min.   WBC 39.1k , afebrile, CXR clear, UA pending  Goal of Therapy:  Vancomycin trough level 15-20 mcg/ml  Plan:   Vancomycin 1000 mg IV q24h  Pharmacy will f/u  Geoffry Paradise Thi 10/18/2011,3:18 PM

## 2011-10-18 NOTE — H&P (Signed)
Name: [name] MRN: [id number] DOB: Apr 04, 1980    LOS: 0  Referring Provider:  Lucien Mons EDP Reason for Referral:  DKA    PULMONARY / CRITICAL CARE MEDICINE  HPI:  31 yowf with PMH of anxiety / panic attacks, back pain, ? Thyroid disease (no home meds listed) who presented to [facility] ED on 8/26 via EMS with a several hour history of altered mental status and readings of "high" on glucose meter. Per her significant other the company that provides her insulin pump supplies had not sent supplies for 2 weeks prior to presentation and she has been self injecting her own insulin for the week prior to admit. Evaluation demonstrated serum glucose of 1016, acute renal failure, hyponatremia, profound metabolic acidosis and significant agitation.  PCCM called for ICU admit.   Past Medical History  Diagnosis Date  . Diabetes mellitus   . Thyroid disease   . Back pain   . Panic attack   . PONV (postoperative nausea and vomiting)   . Anxiety   . GERD (gastroesophageal reflux disease)    Past Surgical History  Procedure Date  . Laparoscopy     age 31  . Back surgery     age 31   Prior to Admission medications   Medication Sig Start Date End Date Taking? Authorizing Provider  ALPRAZolam Prudy Feeler) 1 MG tablet Take 0.5-1 mg by mouth 3 (three) times daily as needed. For anxiety.    Historical Provider, MD  Insulin Human (INSULIN PUMP) 100 unit/ml SOLN Inject into the skin continuous. Humalog insulin pump, Patient is DKA and cannot communicate settings.    Historical Provider, MD  Multiple Vitamin (MULITIVITAMIN WITH MINERALS) TABS Take 1 tablet by mouth daily.    Historical Provider, MD  omeprazole (PRILOSEC) 20 MG capsule Take 20 mg by mouth daily.    Historical Provider, MD  temazepam (RESTORIL) 15 MG capsule Take 15 mg by mouth at bedtime as needed. Insomnia    Historical Provider, MD  traMADol (ULTRAM) 50 MG tablet Take 25-50 mg by mouth every 8 (eight) hours as needed. For pain.    Historical  Provider, MD   Allergies Allergies  Allergen Reactions  . Scallops (Shellfish Allergy) Anaphylaxis  . Morphine And Related Itching    Family History No family history on file. Social History  reports that she quit smoking about 11 years ago. She has quit using smokeless tobacco. She reports that she drinks alcohol. She reports that she does not use illicit drugs.  Review Of Systems:  Unable to complete as patient is altered.    Brief patient description:  31 y/o F admitted on 8/26 with DKA  Events Since Admission: 8/26 - admit, DKA   Vital Signs: Temp:  [97.1 F (36.2 C)] 97.1 F (36.2 C) (08/26 1422) Pulse Rate:  [122] 122  (08/26 1422) Resp:  [25-32] 25  (08/26 1422) BP: (112-150)/(66-85) 112/66 mmHg (08/26 1422) SpO2:  [95 %-100 %] 100 % (08/26 1422) FiO2 (%):  [2 %] 2 % (08/26 1422) RA  Physical Examination: General:  31 year old white female. Non-verbal. Restless and agitated Neuro:  Moves all ext, Moans, not verbal HEENT:  Several piercing, no JVD, mucous membranes are dry  Cardiovascular:  rrr Lungs:  Clear, rapid and deep resp efforts.  Abdomen:  Soft, non-tender  Musculoskeletal:  intact Skin:  intact  Active Problems:  Diabetic ketoacidosis  Altered mental status  Acute renal failure   ASSESSMENT AND PLAN  PULMONARY  ABG  Component Value Date/Time   PHART 6.947* 04/30/2011 0525   PCO2ART 14.3* 04/30/2011 0525   PO2ART 75.9* 04/30/2011 0525   HCO3 3.0* 04/30/2011 0525   TCO2 3.1 04/30/2011 0525   ACIDBASEDEF 29.3* 04/30/2011 0525   O2SAT 85.2 10/18/2011 1357    Ventilator Settings: Vent Mode:  [-]  FiO2 (%):  [2 %] 2 % CXR:  8/26 - lungs clear, no ptx with central line, previous surgical clips suggestive of thyroid surgery     A:  Tachypnea  Due to DKA. Currently protecting airway   P:   Supplemental oxygen Limit sedation  Try to avoid intubation as we will have a difficult time matching her efforts on a vent   CARDIOVASCULAR No results  found for this basename: TROPONINI:5,LATICACIDVEN:5, O2SATVEN:5,PROBNP:5 in the last 168 hours ECG:  ST Lines:  Right IJ CVL 8/26>>>  A:  1)ST      2) Hypovolemia      3) SIRS Think primarily dealing w/ volume depletion. Meets SIRS criteria but no clear evidence of infection.  P:  Transduce CVP IVFs Tele monitoring   RENAL  Lab 10/18/11 1305  NA 119*  K 5.1  CL 66*  CO2 <7*  BUN 36*  CREATININE 1.62*  CALCIUM 9.3  MG --  PHOS --   Intake/Output    None    Foley:  8/26>>>  A:   Acute Renal Failure - in setting of DKA, volume depletion Metabolic Acidosis -+ anion gap/ + Ketones in blood Pseudo-Hyponatremia- corrects to 134 Hypochloremia- volume depletion   P:   -volume resuscitation -DKA protocol   GASTROINTESTINAL  Lab 10/18/11 1305  AST 20  ALT 17  ALKPHOS 113  BILITOT 0.2*  PROT 8.2  ALBUMIN 4.7    A:   No acute issues  P:   -monitor  HEMATOLOGIC  Lab 10/18/11 1305  HGB 12.5  HCT 38.5  PLT 490*  INR --  APTT --   A:   Macrocytosis -? ETOH abuse, noted elevated levels in past Thrombocytosis - likely volume depleted.   P:  -monitor h/h -volume resucitation  INFECTIOUS  Lab 10/18/11 1305  WBC 39.1*  PROCALCITON --   Cultures: UC 8/26>>>  Antibiotics: Vanc (prophylaxis for CVL) 8/26>>> Rocephin  (UTI prophylaxis) 8/26>>>  A:  SIRS. Doubt sepsis. Blood cultures and UC sent. ER notes very difficult line placement and concern for potential contamination. Currently her agitation makes re-attempt at line placement more risky then beneficial  P:   -F/u cultures -Empiric antibiotics to cover procedural prophylaxis and possible UTI source -We will re-eval need for CVL in am. If still needs will place new line under sterile conditions in ICU on 8/27  ENDOCRINE  Lab 10/18/11 1258  GLUCAP >600*   A:  DKA   P:   -DKA protocol  NEUROLOGIC  A:  1) Acute Encephalopathy due to DKA      2) Chronic pain and anxiety (chronic  benzos) P:   rx hyperglycemia  Supportive care   BEST PRACTICE / DISPOSITION Level of Care:  ICU Primary Service:  PCCM Consultants:  None Code Status:  Full  Diet:  NPO  DVT Px:  [place] heparin  GI Px:  PPI  Skin Integrity:  intact Social / Family:  Only sig other currently is her boyfriend Corporate treasurer. His contact number is [phone]    Sandrea Hughs, MD Pulmonary and Critical Care Medicine [facility] Cell [phone]

## 2011-10-18 NOTE — ED Notes (Signed)
Pt is combative, confused, unable to follow command. High CBG, diabetic patient. Pt has acetone smell in her. Old cut noted on bilateral wrist, possible a cutter in the past.

## 2011-10-19 LAB — CBC
HCT: 24.9 % — ABNORMAL LOW (ref 36.0–46.0)
Hemoglobin: 9.1 g/dL — ABNORMAL LOW (ref 12.0–15.0)
MCV: 88.8 fL (ref 78.0–100.0)
Platelets: 168 10*3/uL (ref 150–400)
RBC: 2.68 MIL/uL — ABNORMAL LOW (ref 3.87–5.11)
RBC: 2.83 MIL/uL — ABNORMAL LOW (ref 3.87–5.11)
RDW: 13.2 % (ref 11.5–15.5)
RDW: 13.5 % (ref 11.5–15.5)
WBC: 10.3 10*3/uL (ref 4.0–10.5)
WBC: 9 10*3/uL (ref 4.0–10.5)

## 2011-10-19 LAB — GLUCOSE, CAPILLARY
Glucose-Capillary: >600 mg/dL (ref 70–99)
Glucose-Capillary: 157 mg/dL — ABNORMAL HIGH (ref 70–99)
Glucose-Capillary: 196 mg/dL — ABNORMAL HIGH (ref 70–99)
Glucose-Capillary: 199 mg/dL — ABNORMAL HIGH (ref 70–99)
Glucose-Capillary: 157 mg/dL — ABNORMAL HIGH (ref 70–99)
Glucose-Capillary: 160 mg/dL — ABNORMAL HIGH (ref 70–99)
Glucose-Capillary: 139 mg/dL — ABNORMAL HIGH (ref 70–99)
Glucose-Capillary: 130 mg/dL — ABNORMAL HIGH (ref 70–99)
Glucose-Capillary: 102 mg/dL — ABNORMAL HIGH (ref 70–99)
Glucose-Capillary: 147 mg/dL — ABNORMAL HIGH (ref 70–99)

## 2011-10-19 LAB — BASIC METABOLIC PANEL
BUN: 16 mg/dL (ref 6–23)
CO2: 15 mEq/L — ABNORMAL LOW (ref 19–32)
Calcium: 6.9 mg/dL — ABNORMAL LOW (ref 8.4–10.5)
Calcium: 7.5 mg/dL — ABNORMAL LOW (ref 8.4–10.5)
Calcium: 7.6 mg/dL — ABNORMAL LOW (ref 8.4–10.5)
Calcium: 7.9 mg/dL — ABNORMAL LOW (ref 8.4–10.5)
Chloride: 108 mEq/L (ref 96–112)
Chloride: 108 mEq/L (ref 96–112)
Chloride: 107 mEq/L (ref 96–112)
Creatinine, Ser: 0.84 mg/dL (ref 0.50–1.10)
Creatinine, Ser: 0.7 mg/dL (ref 0.50–1.10)
Creatinine, Ser: 0.63 mg/dL (ref 0.50–1.10)
Creatinine, Ser: 0.65 mg/dL (ref 0.50–1.10)
GFR calc Af Amer: >90 mL/min (ref >90)
GFR calc Af Amer: >90 mL/min (ref >90)
GFR calc Af Amer: >90 mL/min (ref >90)
GFR calc Af Amer: >90 mL/min (ref >90)
GFR calc Af Amer: >90 mL/min (ref >90)
GFR calc non Af Amer: >90 mL/min (ref >90)
GFR calc non Af Amer: >90 mL/min (ref >90)
GFR calc non Af Amer: >90 mL/min (ref >90)
GFR calc non Af Amer: >90 mL/min (ref >90)
Potassium: 3.8 mEq/L (ref 3.5–5.1)
Potassium: 3.9 mEq/L (ref 3.5–5.1)
Sodium: 135 mEq/L (ref 135–145)
Sodium: 135 mEq/L (ref 135–145)
Sodium: 133 mEq/L — ABNORMAL LOW (ref 135–145)
Sodium: 133 mEq/L — ABNORMAL LOW (ref 135–145)

## 2011-10-19 LAB — MAGNESIUM
Magnesium: 1.4 mg/dL — ABNORMAL LOW (ref 1.5–2.5)
Magnesium: 1.8 mg/dL (ref 1.5–2.5)

## 2011-10-19 LAB — LIPASE, BLOOD: Lipase: 7 U/L — ABNORMAL LOW (ref 11–59)

## 2011-10-19 LAB — PROCALCITONIN: Procalcitonin: 1.32 ng/mL

## 2011-10-19 LAB — LACTIC ACID, PLASMA: Lactic Acid, Venous: 0.5 mmol/L (ref 0.5–2.2)

## 2011-10-19 MED ORDER — DEXTROSE 50 % IV SOLN: 25.0000 mL | INTRAVENOUS | Status: DC | PRN

## 2011-10-19 MED ORDER — POTASSIUM CHLORIDE 20 MEQ/15ML (10%) PO LIQD
20.0000 meq | Freq: Every day | ORAL | Status: DC
  Administered 2011-10-19: 20 meq via ORAL
  Filled 2011-10-19 (×2): qty 15

## 2011-10-19 MED ORDER — SODIUM CHLORIDE 0.9 % IV SOLN
INTRAVENOUS | Status: DC
  Administered 2011-10-19: 20 mL/h via INTRAVENOUS

## 2011-10-19 MED ORDER — SODIUM CHLORIDE 0.9 % IV SOLN
INTRAVENOUS | Status: DC
  Administered 2011-10-19: 0.7 [IU]/h via INTRAVENOUS

## 2011-10-19 MED ORDER — SODIUM PHOSPHATE 3 MMOLE/ML IV SOLN
30.0000 mmol | Freq: Once | INTRAVENOUS | Status: AC
  Administered 2011-10-19: 30 mmol via INTRAVENOUS
  Filled 2011-10-19: qty 10

## 2011-10-19 MED ORDER — INSULIN ASPART 100 UNIT/ML ~~LOC~~ SOLN
0.0000 [IU] | SUBCUTANEOUS | Status: DC
  Administered 2011-10-19: 3 [IU] via SUBCUTANEOUS

## 2011-10-19 MED ORDER — SODIUM CHLORIDE 0.9 % IV BOLUS (SEPSIS)
1000.0000 mL | INTRAVENOUS | Status: AC
  Administered 2011-10-19: 1000 mL via INTRAVENOUS

## 2011-10-19 MED ORDER — PANTOPRAZOLE SODIUM 20 MG PO TBEC
20.0000 mg | DELAYED_RELEASE_TABLET | Freq: Every day | ORAL | Status: DC
  Administered 2011-10-19 – 2011-10-21 (×3): 20 mg via ORAL
  Filled 2011-10-19 (×3): qty 1

## 2011-10-19 MED ORDER — SODIUM CHLORIDE 0.9 % IV SOLN
INTRAVENOUS | Status: DC
  Administered 2011-10-19: 19:00:00 via INTRAVENOUS

## 2011-10-19 MED ORDER — HEPARIN SODIUM (PORCINE) 5000 UNIT/ML IJ SOLN
5000.0000 [IU] | Freq: Three times a day (TID) | INTRAMUSCULAR | Status: DC
  Administered 2011-10-19 – 2011-10-20 (×2): 5000 [IU] via SUBCUTANEOUS
  Filled 2011-10-19 (×5): qty 1

## 2011-10-19 MED ORDER — MAGNESIUM SULFATE 40 MG/ML IJ SOLN
2.0000 g | Freq: Once | INTRAMUSCULAR | Status: AC
  Administered 2011-10-19: 2 g via INTRAVENOUS
  Filled 2011-10-19: qty 50

## 2011-10-19 MED ORDER — INSULIN GLARGINE 100 UNIT/ML ~~LOC~~ SOLN: 5.0000 [IU] | Freq: Every day | SUBCUTANEOUS | Status: DC

## 2011-10-19 MED ORDER — INSULIN GLARGINE 100 UNIT/ML ~~LOC~~ SOLN
5.0000 [IU] | Freq: Every day | SUBCUTANEOUS | Status: AC
  Administered 2011-10-19: 5 [IU] via SUBCUTANEOUS

## 2011-10-19 MED ORDER — POTASSIUM CHLORIDE 20 MEQ/15ML (10%) PO LIQD
40.0000 meq | Freq: Once | ORAL | Status: AC
  Administered 2011-10-19: 40 meq via ORAL
  Filled 2011-10-19: qty 90

## 2011-10-19 MED ORDER — ALPRAZOLAM 0.5 MG PO TABS
0.5000 mg | ORAL_TABLET | Freq: Three times a day (TID) | ORAL | Status: DC | PRN
  Administered 2011-10-20: 0.5 mg via ORAL
  Filled 2011-10-19: qty 1

## 2011-10-19 MED ORDER — SODIUM CHLORIDE 0.9 % IV SOLN: 1.0000 g | Freq: Once | INTRAVENOUS | Status: DC

## 2011-10-19 MED ORDER — DEXTROSE-NACL 5-0.45 % IV SOLN
INTRAVENOUS | Status: DC
  Administered 2011-10-19: 19:00:00 via INTRAVENOUS

## 2011-10-19 MED ORDER — LIDOCAINE HCL (PF) 1 % IJ SOLN
INTRAMUSCULAR | Status: AC
  Filled 2011-10-19: qty 5

## 2011-10-19 MED ORDER — LIDOCAINE HCL 1 % IJ SOLN
INTRAMUSCULAR | Status: AC
  Filled 2011-10-19: qty 20

## 2011-10-19 MED ORDER — SODIUM CHLORIDE 0.9 % IV SOLN
INTRAVENOUS | Status: AC
  Administered 2011-10-19: 12:00:00 via INTRAVENOUS

## 2011-10-19 NOTE — Progress Notes (Signed)
CARE MANAGEMENT NOTE 10/19/2011  Patient:  [name], [name]   Account Number:  [id number]  Date Initiated:  10/19/2011  Documentation initiated by:  DAVIS,RHONDA  Subjective/Objective Assessment:   patient admitted with bld glucose 1016, has home insulin pump family reports has not had supplies for two weeks, had been self inmjecting insulin since that time.     Action/Plan:   lives at home with family,   Anticipated DC Date:  10/22/2011   Anticipated DC Plan:  HOME W HOME HEALTH SERVICES  In-house referral  NA      DC Planning Services  NA      [facility] Choice  NA   Choice offered to / List presented to:  NA   DME arranged  NA      DME agency  NA     HH arranged  NA      HH agency  NA   Status of service:  In process, will continue to follow Medicare Important Message given?  NA - LOS <3 / Initial given by admissions (If response is "NO", the following Medicare IM given date fields will be blank) Date Medicare IM given:   Date Additional Medicare IM given:    Discharge Disposition:    Per UR Regulation:  Reviewed for med. necessity/level of care/duration of stay  If discussed at Long Length of Stay Meetings, dates discussed:    Comments:  [id number]/Rhonda Earlene Plater, RN, BSN, CCM: CHART REVIEWED AND UPDATED. NO DISCHARGE NEEDS PRESENT AT THIS TIME. CASE MANAGEMENT [phone]

## 2011-10-19 NOTE — Progress Notes (Signed)
  [facility] accept note from Dr. Craige Cotta   31 yr old Female admit with DKA, [initial anion gap 34], initial blood glucose 1016 h/o Insulin pump (?)followed by Endocrinology, Dr. Sharl Ma?  She still has an Anion gap today  H/o chronic pain and anxiety as well-might need Psychiatry to help coordinate this as well?  [facility] will assume care 8/28 am   Pleas Koch, MD [facility] [phone]

## 2011-10-19 NOTE — Progress Notes (Signed)
RT tried to insert aline and was unsuccessful. RN made aware and states that she will call anesthesia.

## 2011-10-19 NOTE — Progress Notes (Signed)
Insulin drip restarted at 1900 due to DKA at 0.7 units/hr with D5. Report to night RN. Patient remains lethargic, but responsive to verbal stimuli.

## 2011-10-19 NOTE — Progress Notes (Signed)
I have spoken with Dr. Darrick Penna about this patient because I am concerned about her low BP and no mental status improvement, she continues to moan and withdraw only (including WBC in 30s on admission). Labs ordered, boluses given, CVP 15. Will continue to monitor.

## 2011-10-19 NOTE — Progress Notes (Signed)
Inpatient Diabetes Program Recommendations  AACE/ADA: New Consensus Statement on Inpatient Glycemic Control (2013)  Target Ranges:  Prepandial:   less than 140 mg/dL      Peak postprandial:   less than 180 mg/dL (1-2 hours)      Critically ill patients:  140 - 180 mg/dL   Diabetes Coordinator attempted to speak with the patient concerning insulin pump settings to help with transition off of insulin gtt.   The patient was very sleepy but did say that she does not have her pump here.  The following settings are from a progress note made by me during her last admission in March 2013. 12AM-6AM= 0.6 units/h  6AM-6PM= 1.1 units/h  6PM-12AM=0.9 units/h  Total basal 22.2 a day  Correction factor is 1:25 and the insulin to CHO ratio is 1:6   Based on these settings, I would recommend the following basal/bolus therapy per Glycemic Control Order-set: Lantus 20 units 1-2 hours before IV insulin gtt is discontinued Novolog Sensitive scale TID + HS scale Novolog 6 units TID with meals   Thank you  Piedad Climes RN,BSN,CDE Inpatient Diabetes Coordinator [phone] (team pager)

## 2011-10-19 NOTE — Progress Notes (Signed)
Name: [name] MRN: [id number] DOB: 1980/10/25    LOS: 1  Referring Provider:  Lucien Mons EDP Reason for Referral:  DKA    PULMONARY / CRITICAL CARE MEDICINE  HPI:  31 yo female admitted 10/18/2011 with metabolic encephalopathy 2nd to DKA with acute renal failure.  She has insulin pump, but family reports that she did not receive supplies for 2 weeks. PMHx of Panic attacks, GERD  Events Since Admission: 8/26 - admit, DKA  Current status: Remains on insulin gtt.  C/o pain all over.  Vital Signs: Temp:  [97.1 F (36.2 C)-99.5 F (37.5 C)] 98.4 F (36.9 C) (08/27 0715) Pulse Rate:  [95-124] 101  (08/27 0715) Resp:  [11-32] 13  (08/27 0715) BP: (75-150)/(35-85) 97/60 mmHg (08/27 0715) SpO2:  [95 %-100 %] 100 % (08/27 0715) Arterial Line BP: (91-116)/(41-56) 116/56 mmHg (08/27 0715) FiO2 (%):  [2 %] 2 % (08/26 1422) Weight:  [96 lb (43.545 kg)-107 lb 2.3 oz (48.6 kg)] 107 lb 2.3 oz (48.6 kg) (08/27 0500)   Physical Examination: General: Ill appearing Neuro: Somnolent, easily arousable, intermittently agitated HEENT:  No sinus tenderness Cardiovascular:  s1s2 regular, tachycardic Lungs:  No wheeze Abdomen:  Soft, decreased bowel sounds, tender in mid-epigastrium, no rebound/guarding, no masses Musculoskeletal:  No edema Skin: no rashes  Dg Chest 1 View  10/18/2011  *RADIOLOGY REPORT*  Clinical Data: Hyperglycemia.  Line placement.  Unresponsive.  CHEST - 1 VIEW  Comparison: 04/30/2011 radiographs.  Findings: Right IJ central venous catheter tip is at the level of the SVC right atrial junction, similar to prior study.  The heart size and mediastinal contours are stable.  There are surgical clips at the thoracic inlet suggesting prior thyroid or parathyroid surgery.  The lungs are clear.  There is no pleural effusion or pneumothorax.  IMPRESSION: Central venous catheter positioned as above.  No active cardiopulmonary process.   Original Report Authenticated By: Gerrianne Scale,  M.D.     ASSESSMENT AND PLAN  PULMONARY  A:  Tachypnea in setting of acidosis. P:   Titrate oxygen to keep SpO2 > 92% Bronchial hygiene   CARDIOVASCULAR  Lines:  Right IJ CVL 8/26>>>8/27 Rt radial aline 8/26>>>8/27  A:  Sinus tachycardia in setting of volume depletion from DKA>>imrpoving. P:  Continue volume resuscitation D/c central line and CVP checks  RENAL  Lab 10/19/11 0445 10/18/11 2222 10/18/11 2038 10/18/11 1815 10/18/11 1542  NA 135 137 135 133* 127*  K 4.3 3.0* -- -- --  CL 108 100 96 92* 82*  CO2 13* 11* 8* <7* <7*  BUN 22 27* 29* 32* 34*  CREATININE 0.84 1.07 1.18* 1.37* 1.48*  CALCIUM 6.9* 8.1* 8.0* 8.2* 7.8*  MG 1.4* -- -- -- --  PHOS 1.8* -- -- -- --   Intake/Output      08/26 0701 - 08/27 0700 08/27 0701 - 08/28 0700   I.V. (mL/kg) 4852.2 (99.8) 2.9 (0.1)   IV Piggyback 3560 50   Total Intake(mL/kg) 8412.2 (173.1) 52.9 (1.1)   Urine (mL/kg/hr) 630 (0.5)    Total Output 630    Net +7782.2 +52.9         Foley:  8/26>>>  A: Acute renal failure 2nd to volume depletion in setting of DKA. Hypokalemia, Hypomagnesemia, Hypophosphatemia. P:   F/u urine outpt, renal fx F/u and replace electrolytes as needed  Keep foley in for now  GASTROINTESTINAL  Lab 10/18/11 1305  AST 20  ALT 17  ALKPHOS 113  BILITOT 0.2*  PROT 8.2  ALBUMIN 4.7    A: Nutrition. Abdominal pain>>exam benign 8/27. P:   Check amylase, lipase Clear liquid diet  HEMATOLOGIC  Lab 10/19/11 0445 10/18/11 1542 10/18/11 1305  HGB 8.7* 11.0* 12.5  HCT 23.8* 31.7* 38.5  PLT 168 358 490*  INR -- -- --  APTT -- -- --   A: Anemia>>likely hemodilution P:  F/u CBC  INFECTIOUS  Lab 10/19/11 0445 10/18/11 1542 10/18/11 1305  WBC 10.3 35.2* 39.1*  PROCALCITON 1.32 -- --   Cultures: UC 8/26>>>  Antibiotics: Vanc (prophylaxis for CVL) 8/26>>> Rocephin  (UTI prophylaxis) 8/26>>>  A:  SIRS>>no evidence for infection. P:   D/c Abx D/c central line  ENDOCRINE  Lab  10/19/11 0612 10/19/11 0501 10/19/11 0344 10/19/11 0254 10/19/11 0151  GLUCAP 199* 216* 196* 157* 136*   A:  DKA P:   Continue insulin gtt until anion gap corrected Continue IV fluids  NEUROLOGIC  A: Acute metabolic encephalopathy 2nd to DKA>>improved. Chronic pain, anxiety. ?Hx of ETOH. P:   Xanax prn May need psych evaluation  BEST PRACTICE / DISPOSITION Level of Care:  SDU Primary Service:  Transfer to [facility] 8/28 and PCCM sign off Consultants:  None Code Status:  Full  Diet:  Clear liquid diet DVT Px:  [place] heparin  GI Px:  PPI  Skin Integrity:  intact Social / Family:  Only sig other currently is her boyfriend Corporate treasurer. His contact number is [phone]  Coralyn Helling, MD [facility] Pulmonary/Critical Care 10/19/2011, 8:26 AM Pager:  [phone] After 3pm call: [phone]

## 2011-10-19 NOTE — Progress Notes (Deleted)
Hypocalcemia  Calcium ordered

## 2011-10-19 NOTE — Progress Notes (Signed)
DKA;   Reinitiate insulin drip and D5. Reassess gap and glycemic control overnight and in am.

## 2011-10-20 DIAGNOSIS — E1010 Type 1 diabetes mellitus with ketoacidosis without coma: Principal | ICD-10-CM

## 2011-10-20 DIAGNOSIS — E860 Dehydration: Secondary | ICD-10-CM

## 2011-10-20 DIAGNOSIS — D649 Anemia, unspecified: Secondary | ICD-10-CM

## 2011-10-20 LAB — BASIC METABOLIC PANEL
BUN: 5 mg/dL — ABNORMAL LOW (ref 6–23)
BUN: 4 mg/dL — ABNORMAL LOW (ref 6–23)
BUN: 3 mg/dL — ABNORMAL LOW (ref 6–23)
BUN: 3 mg/dL — ABNORMAL LOW (ref 6–23)
CO2: 17 mEq/L — ABNORMAL LOW (ref 19–32)
CO2: 18 mEq/L — ABNORMAL LOW (ref 19–32)
CO2: 16 mEq/L — ABNORMAL LOW (ref 19–32)
Calcium: 8.2 mg/dL — ABNORMAL LOW (ref 8.4–10.5)
Calcium: 7.7 mg/dL — ABNORMAL LOW (ref 8.4–10.5)
Calcium: 7.8 mg/dL — ABNORMAL LOW (ref 8.4–10.5)
Calcium: 7.6 mg/dL — ABNORMAL LOW (ref 8.4–10.5)
Chloride: 106 mEq/L (ref 96–112)
Chloride: 100 mEq/L (ref 96–112)
Chloride: 105 mEq/L (ref 96–112)
Chloride: 106 mEq/L (ref 96–112)
Creatinine, Ser: 0.61 mg/dL (ref 0.50–1.10)
Creatinine, Ser: 0.59 mg/dL (ref 0.50–1.10)
Creatinine, Ser: 0.57 mg/dL (ref 0.50–1.10)
Creatinine, Ser: 0.51 mg/dL (ref 0.50–1.10)
GFR calc Af Amer: >90 mL/min (ref >90)
GFR calc Af Amer: >90 mL/min (ref >90)
GFR calc Af Amer: >90 mL/min (ref >90)
GFR calc non Af Amer: >90 mL/min (ref >90)
GFR calc non Af Amer: >90 mL/min (ref >90)
GFR calc non Af Amer: >90 mL/min (ref >90)
GFR calc non Af Amer: >90 mL/min (ref >90)
GFR calc non Af Amer: >90 mL/min (ref >90)
Glucose, Bld: 165 mg/dL — ABNORMAL HIGH (ref 70–99)
Glucose, Bld: 223 mg/dL — ABNORMAL HIGH (ref 70–99)
Glucose, Bld: 163 mg/dL — ABNORMAL HIGH (ref 70–99)
Glucose, Bld: 107 mg/dL — ABNORMAL HIGH (ref 70–99)
Glucose, Bld: 136 mg/dL — ABNORMAL HIGH (ref 70–99)
Glucose, Bld: 213 mg/dL — ABNORMAL HIGH (ref 70–99)
Potassium: 3.9 mEq/L (ref 3.5–5.1)
Potassium: 3.3 mEq/L — ABNORMAL LOW (ref 3.5–5.1)
Potassium: 4.3 mEq/L (ref 3.5–5.1)
Potassium: 3.6 mEq/L (ref 3.5–5.1)
Sodium: 131 mEq/L — ABNORMAL LOW (ref 135–145)
Sodium: 134 mEq/L — ABNORMAL LOW (ref 135–145)
Sodium: 133 mEq/L — ABNORMAL LOW (ref 135–145)

## 2011-10-20 LAB — COMPREHENSIVE METABOLIC PANEL
AST: 43 U/L — ABNORMAL HIGH (ref 0–37)
Albumin: 3.3 g/dL — ABNORMAL LOW (ref 3.5–5.2)
Alkaline Phosphatase: 60 U/L (ref 39–117)
BUN: 6 mg/dL (ref 6–23)
Chloride: 102 mEq/L (ref 96–112)
Potassium: 3.9 mEq/L (ref 3.5–5.1)
Sodium: 132 mEq/L — ABNORMAL LOW (ref 135–145)
Total Protein: 6.3 g/dL (ref 6.0–8.3)

## 2011-10-20 LAB — CBC
HCT: 30.1 % — ABNORMAL LOW (ref 36.0–46.0)
MCHC: 36.5 g/dL — ABNORMAL HIGH (ref 30.0–36.0)
Platelets: 185 10*3/uL (ref 150–400)
RDW: 13.9 % (ref 11.5–15.5)
WBC: 8 10*3/uL (ref 4.0–10.5)

## 2011-10-20 LAB — GLUCOSE, CAPILLARY
Glucose-Capillary: 123 mg/dL — ABNORMAL HIGH (ref 70–99)
Glucose-Capillary: 109 mg/dL — ABNORMAL HIGH (ref 70–99)
Glucose-Capillary: 114 mg/dL — ABNORMAL HIGH (ref 70–99)
Glucose-Capillary: 141 mg/dL — ABNORMAL HIGH (ref 70–99)
Glucose-Capillary: 165 mg/dL — ABNORMAL HIGH (ref 70–99)
Glucose-Capillary: 154 mg/dL — ABNORMAL HIGH (ref 70–99)
Glucose-Capillary: 156 mg/dL — ABNORMAL HIGH (ref 70–99)
Glucose-Capillary: 175 mg/dL — ABNORMAL HIGH (ref 70–99)
Glucose-Capillary: 184 mg/dL — ABNORMAL HIGH (ref 70–99)
Glucose-Capillary: 230 mg/dL — ABNORMAL HIGH (ref 70–99)
Glucose-Capillary: 131 mg/dL — ABNORMAL HIGH (ref 70–99)
Glucose-Capillary: 176 mg/dL — ABNORMAL HIGH (ref 70–99)
Glucose-Capillary: 203 mg/dL — ABNORMAL HIGH (ref 70–99)

## 2011-10-20 LAB — MAGNESIUM: Magnesium: 1.6 mg/dL (ref 1.5–2.5)

## 2011-10-20 LAB — HEMOGLOBIN A1C: Hgb A1c MFr Bld: 10.2 % — ABNORMAL HIGH (ref <5.7)

## 2011-10-20 MED ORDER — POTASSIUM CHLORIDE 10 MEQ/100ML IV SOLN
10.0000 meq | INTRAVENOUS | Status: DC
  Administered 2011-10-20 – 2011-10-21 (×2): 10 meq via INTRAVENOUS
  Filled 2011-10-20: qty 100

## 2011-10-20 MED ORDER — POTASSIUM CHLORIDE 10 MEQ/100ML IV SOLN
10.0000 meq | INTRAVENOUS | Status: AC
  Administered 2011-10-20 (×2): 10 meq via INTRAVENOUS
  Filled 2011-10-20 (×2): qty 100

## 2011-10-20 MED ORDER — SODIUM CHLORIDE 0.9 % IV BOLUS (SEPSIS)
1000.0000 mL | Freq: Once | INTRAVENOUS | Status: AC
  Administered 2011-10-20: 1000 mL via INTRAVENOUS

## 2011-10-20 MED ORDER — ENOXAPARIN SODIUM 40 MG/0.4ML ~~LOC~~ SOLN
40.0000 mg | SUBCUTANEOUS | Status: DC
  Administered 2011-10-20 – 2011-10-22 (×3): 40 mg via SUBCUTANEOUS
  Filled 2011-10-20 (×3): qty 0.4

## 2011-10-20 MED ORDER — SODIUM CHLORIDE 0.9 % IV SOLN
INTRAVENOUS | Status: DC
  Administered 2011-10-20: 2.9 [IU]/h via INTRAVENOUS
  Filled 2011-10-20 (×2): qty 1

## 2011-10-20 MED ORDER — SODIUM CHLORIDE 0.9 % IV SOLN: INTRAVENOUS | Status: DC

## 2011-10-20 MED ORDER — POTASSIUM CHLORIDE 10 MEQ/100ML IV SOLN
10.0000 meq | INTRAVENOUS | Status: AC
  Administered 2011-10-20 (×4): 10 meq via INTRAVENOUS
  Filled 2011-10-20 (×4): qty 100

## 2011-10-20 MED ORDER — SODIUM CHLORIDE 0.9 % IV SOLN
INTRAVENOUS | Status: DC
  Administered 2011-10-20 (×2): via INTRAVENOUS

## 2011-10-20 MED ORDER — SODIUM PHOSPHATE 3 MMOLE/ML IV SOLN
30.0000 mmol | Freq: Once | INTRAVENOUS | Status: AC
  Administered 2011-10-20: 30 mmol via INTRAVENOUS
  Filled 2011-10-20: qty 10

## 2011-10-20 MED ORDER — POTASSIUM CHLORIDE 10 MEQ/100ML IV SOLN
INTRAVENOUS | Status: AC
  Filled 2011-10-20: qty 100

## 2011-10-20 MED ORDER — KCL IN DEXTROSE-NACL 10-5-0.45 MEQ/L-%-% IV SOLN
INTRAVENOUS | Status: DC
  Administered 2011-10-20 – 2011-10-21 (×4): via INTRAVENOUS
  Filled 2011-10-20 (×8): qty 1000

## 2011-10-20 MED ORDER — POTASSIUM CHLORIDE 10 MEQ/100ML IV SOLN: 10.0000 meq | INTRAVENOUS | Status: DC

## 2011-10-20 MED ORDER — DEXTROSE 50 % IV SOLN: 25.0000 mL | INTRAVENOUS | Status: DC | PRN

## 2011-10-20 MED ORDER — DEXTROSE-NACL 5-0.45 % IV SOLN
INTRAVENOUS | Status: DC
  Administered 2011-10-20: 10:00:00 via INTRAVENOUS

## 2011-10-20 NOTE — Progress Notes (Signed)
[facility] PROGRESS NOTE  [name] RUE:[id number] DOB: [birth year] DOA: 10/18/2011 PCP: No primary provider on file.  Assessment/Plan: Principal Problem:  *DKA, type 1, not at goal Active Problems:  Altered mental status  Acute renal failure  Dehydration  Anemia  Encephalopathy, metabolic  Hypophosphatasia  1. DKA in patient with uncontrolled type 1 diabetes mellitus: Patient still has an anion gap of 15. Bolus with IV normal saline. Continue n.p.o., IV insulin drip, checking frequent BMPs. Once her anion gap is closed and bicarbonate is greater than 19, we'll consider transitioning to insulin pump or Lantus with NovoLog. Check hemoglobin A1c. 2. Dehydration: Patient probably still volume depleted. Increase IV fluids. 3. Anemia: Stable. 4. Acute renal failure: Secondary to dehydration and DKA: Resolved. 5. Encephalopathy: Secondary to DKA-resolved. 6. Hypophosphatemia: Status post IV runs. Follow levels.  7. Hyponatremia: probably combination of pseudohyponatremia from hyperglycemia and dehydration. Management as above follow daily BMP.   Code Status:  full  Family Communication:  none  Disposition Plan: continue care in the step down unit for today.    Brief narrative: 21 yowf with PMH of anxiety / panic attacks, back pain, ? Thyroid disease (no home meds listed) who presented to [facility] ED on 8/26 via EMS with a several hour history of altered mental status and readings of "high" on glucose meter. Per her significant other the company that provides her insulin pump supplies had not sent supplies for 2 weeks prior to presentation and she has been self injecting her own insulin for the week prior to admit. Evaluation demonstrated serum glucose of 1016, acute renal failure, hyponatremia, profound metabolic acidosis and significant agitation. PCCM admitted patient.    Consultants:   Critical Care Medicine: Dr. Craige Cotta  Procedures:   none   Antibiotics:   none    HPI/Subjective:  Patient denies complaints. She feels hungry and is requesting to eat. She claims compliance to her insulins up to the morning of admission. She also indicates that she has had problems with her blood sugar control at home and has range from as high as 600 mg/dL to go off 22 mg/dL. 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 She denies pain, nausea or vomiting.   Objective: Filed Vitals:   10/20/11 0800 10/20/11 1000 10/20/11 1100 10/20/11 1200  BP: 106/68 100/62    Pulse: 94 88 91   Temp: 98.6 F (37 C)   98.1 F (36.7 C)  TempSrc: Oral   Oral  Resp: 15 12 22    Height:      Weight:      SpO2: 100% 100% 100%     Intake/Output Summary (Last 24 hours) at 10/20/11 1432 Last data filed at 10/20/11 1200  Gross per 24 hour  Intake 2689.9 ml  Output   3130 ml  Net -440.1 ml   Filed Weights   10/18/11 1524 10/19/11 0500  Weight: 43.545 kg (96 lb) 48.6 kg (107 lb 2.3 oz)    Exam:  General exam: Moderately built and nourished female lying comfortably in bed without any distress. Respiratory system: Clear to auscultation. No increased work of breathing. Cardiovascular system: First and second heart sounds heard, regular rate and rhythm. No JVD, murmur pedal edema. Telemetry shows sinus rhythm. Gastrointestinal system: Abdomen is nondistended, soft and nontender. Normal bowel sounds heard. Central nervous system: Alert and oriented. No focal neurological deficits. Extremities: Symmetric 5 x 5 power. Psychiatric: Pleasant and  appropriate. Cooperative with interview and exam.  Data Reviewed: Basic Metabolic Panel:  Lab 10/20/11 4098 10/20/11 1109 10/20/11 0840 10/20/11 0540 10/20/11 0110 10/19/11 1659 10/19/11 0445  NA 131* 131* 131* 132* 134* -- --  K 3.1* 3.3* 3.4* 3.9 3.9 -- --  CL 100 100 100 102 106 -- --  CO2 15* 16* 16* 15* 14* -- --  GLUCOSE 261* 223* 165* 162*  121* -- --  BUN 4* 5* 5* 6 8 -- --  CREATININE 0.57 0.59 0.61 0.68 0.63 -- --  CALCIUM 7.8* 7.7* 8.2* 8.3* 8.0* -- --  MG -- -- -- 1.6 -- 1.8 1.4*  PHOS -- -- -- 0.9* -- 1.5* 1.8*   Liver Function Tests:  Lab 10/20/11 0540 10/18/11 1305  AST 43* 20  ALT 21 17  ALKPHOS 60 113  BILITOT 0.4 0.2*  PROT 6.3 8.2  ALBUMIN 3.3* 4.7    Lab 10/19/11 1130  LIPASE 7*  AMYLASE 308*   No results found for this basename: AMMONIA:5 in the last 168 hours CBC:  Lab 10/20/11 0540 10/19/11 1953 10/19/11 0445 10/18/11 1542 10/18/11 1305  WBC 8.0 9.0 10.3 35.2* 39.1*  NEUTROABS -- -- -- -- --  HGB 11.0* 9.1* 8.7* 11.0* 12.5  HCT 30.1* 24.9* 23.8* 31.7* 38.5  MCV 89.1 88.0 88.8 95.5 100.5*  PLT 185 173 168 358 490*   Cardiac Enzymes: No results found for this basename: CKTOTAL:5,CKMB:5,CKMBINDEX:5,TROPONINI:5 in the last 168 hours BNP (last 3 results) No results found for this basename: PROBNP:3 in the last 8760 hours CBG:  Lab 10/20/11 1216 10/20/11 1124 10/20/11 1017 10/20/11 0902 10/20/11 0743  GLUCAP 263* 199* 175* 154* 165*    Recent Results (from the past 240 hour(s))  MRSA PCR SCREENING     Status: Normal   Collection Time   10/18/11  9:28 PM      Component Value Range Status Comment   MRSA by PCR NEGATIVE  NEGATIVE Final      Studies: Dg Chest 1 View  10/18/2011  *RADIOLOGY REPORT*  Clinical Data: Hyperglycemia.  Line placement.  Unresponsive.  CHEST - 1 VIEW  Comparison: 04/30/2011 radiographs.  Findings: Right IJ central venous catheter tip is at the level of the SVC right atrial junction, similar to prior study.  The heart size and mediastinal contours are stable.  There are surgical clips at the thoracic inlet suggesting prior thyroid or parathyroid surgery.  The lungs are clear.  There is no pleural effusion or pneumothorax.  IMPRESSION: Central venous catheter positioned as above.  No active cardiopulmonary process.   Original Report Authenticated By: Gerrianne Scale,  M.D.     Scheduled Meds:    . enoxaparin  40 mg Subcutaneous Q24H  . insulin glargine  5 Units Subcutaneous QHS  . lidocaine      . pantoprazole  20 mg Oral Q1200  . potassium chloride  10 mEq Intravenous Q1H  . potassium chloride  10 mEq Intravenous Q1 Hr x 4  . sodium chloride  1,000 mL Intravenous Once  . sodium chloride  1,000 mL Intravenous Once  . sodium phosphate  Dextrose 5% IVPB  30 mmol Intravenous Once  . DISCONTD: calcium gluconate  1 g Intravenous Once  . DISCONTD: heparin  5,000 Units Subcutaneous Q8H  . DISCONTD: heparin  5,000 Units Subcutaneous Q8H  . DISCONTD: insulin aspart  0-9 Units Subcutaneous Q4H  . DISCONTD: insulin glargine  5 Units Subcutaneous QHS  . DISCONTD: potassium chloride  20 mEq  Oral Daily   Continuous Infusions:    . sodium chloride 10 mL/hr at 10/20/11 0904  . dextrose 5 % and 0.45 % NaCl with KCl 10 mEq/L    . insulin (NOVOLIN-R) infusion Stopped (10/20/11 0906)  . DISCONTD: sodium chloride 20 mL/hr at 10/19/11 1847  . DISCONTD: sodium chloride 20 mL/hr (10/19/11 1900)  . DISCONTD: sodium chloride    . DISCONTD: dextrose 5 % and 0.45% NaCl 150 mL/hr (10/18/11 2341)  . DISCONTD: dextrose 5 % and 0.45% NaCl 50 mL/hr at 10/19/11 1922  . DISCONTD: dextrose 5 % and 0.45% NaCl 150 mL/hr at 10/20/11 1012  . DISCONTD: insulin (NOVOLIN-R) infusion 2.9 Units/hr (10/19/11 0746)  . DISCONTD: insulin (NOVOLIN-R) infusion 0.7 Units/hr (10/19/11 1900)    Time spent:  45 minutes.    [facility]  [facility] Pager [phone] .  If 8PM-8AM, please contact night-coverage at [email or web address], password [facility] 10/20/2011, 2:32 PM  LOS: 2 days

## 2011-10-20 NOTE — Progress Notes (Signed)
CRITICAL VALUE ALERT  Critical value received:  Phos 0.9  Date of notification:  10/20/11  Time of notification:  0619  Critical value read back:yes  Nurse who received alert:  M. Liyana Suniga  MD notified (1st page):  Dr. Darrick Penna  Time of first page:  0629  MD notified (2nd page):  Time of second page:  Responding MD:  Dr. Darrick Penna  Time MD responded:

## 2011-10-21 LAB — BASIC METABOLIC PANEL
BUN: <3 mg/dL — ABNORMAL LOW (ref 6–23)
BUN: <3 mg/dL — ABNORMAL LOW (ref 6–23)
CO2: 20 mEq/L (ref 19–32)
Chloride: 101 mEq/L (ref 96–112)
Chloride: 108 mEq/L (ref 96–112)
Creatinine, Ser: 0.48 mg/dL — ABNORMAL LOW (ref 0.50–1.10)
Creatinine, Ser: 0.47 mg/dL — ABNORMAL LOW (ref 0.50–1.10)
GFR calc Af Amer: >90 mL/min (ref >90)
GFR calc Af Amer: >90 mL/min (ref >90)
GFR calc non Af Amer: >90 mL/min (ref >90)
Glucose, Bld: 146 mg/dL — ABNORMAL HIGH (ref 70–99)
Potassium: 2.7 mEq/L — CL (ref 3.5–5.1)
Potassium: 3.4 mEq/L — ABNORMAL LOW (ref 3.5–5.1)
Sodium: 133 mEq/L — ABNORMAL LOW (ref 135–145)

## 2011-10-21 LAB — CBC
HCT: 24.2 % — ABNORMAL LOW (ref 36.0–46.0)
MCH: 33 pg (ref 26.0–34.0)
MCHC: 36.8 g/dL — ABNORMAL HIGH (ref 30.0–36.0)
MCV: 89.6 fL (ref 78.0–100.0)
RDW: 14.2 % (ref 11.5–15.5)

## 2011-10-21 LAB — GLUCOSE, CAPILLARY
Glucose-Capillary: 143 mg/dL — ABNORMAL HIGH (ref 70–99)
Glucose-Capillary: 185 mg/dL — ABNORMAL HIGH (ref 70–99)
Glucose-Capillary: 215 mg/dL — ABNORMAL HIGH (ref 70–99)
Glucose-Capillary: 177 mg/dL — ABNORMAL HIGH (ref 70–99)
Glucose-Capillary: 188 mg/dL — ABNORMAL HIGH (ref 70–99)

## 2011-10-21 MED ORDER — INSULIN GLARGINE 100 UNIT/ML ~~LOC~~ SOLN
20.0000 [IU] | Freq: Every day | SUBCUTANEOUS | Status: DC
  Administered 2011-10-21: 20 [IU] via SUBCUTANEOUS

## 2011-10-21 MED ORDER — CLONIDINE HCL 0.1 MG PO TABS
0.0500 mg | ORAL_TABLET | Freq: Two times a day (BID) | ORAL | Status: DC
Start: 2011-10-21 — End: 2011-10-22
  Administered 2011-10-21 – 2011-10-22 (×2): 0.05 mg via ORAL
  Filled 2011-10-21 (×3): qty 0.5

## 2011-10-21 MED ORDER — INSULIN ASPART 100 UNIT/ML ~~LOC~~ SOLN: 3.0000 [IU] | Freq: Three times a day (TID) | SUBCUTANEOUS | Status: DC

## 2011-10-21 MED ORDER — INSULIN ASPART 100 UNIT/ML ~~LOC~~ SOLN
0.0000 [IU] | Freq: Three times a day (TID) | SUBCUTANEOUS | Status: DC
  Administered 2011-10-21: 7 [IU] via SUBCUTANEOUS

## 2011-10-21 MED ORDER — INSULIN GLARGINE 100 UNIT/ML ~~LOC~~ SOLN
22.0000 [IU] | Freq: Every day | SUBCUTANEOUS | Status: DC
  Administered 2011-10-21: 22 [IU] via SUBCUTANEOUS

## 2011-10-21 MED ORDER — POTASSIUM CHLORIDE 20 MEQ/15ML (10%) PO LIQD
40.0000 meq | Freq: Once | ORAL | Status: AC
  Administered 2011-10-21: 40 meq via ORAL
  Filled 2011-10-21: qty 30

## 2011-10-21 MED ORDER — INSULIN ASPART 100 UNIT/ML ~~LOC~~ SOLN
0.0000 [IU] | Freq: Every day | SUBCUTANEOUS | Status: DC
  Administered 2011-10-21: 2 [IU] via SUBCUTANEOUS

## 2011-10-21 MED ORDER — POTASSIUM CHLORIDE 10 MEQ/100ML IV SOLN: 10.0000 meq | INTRAVENOUS | Status: DC

## 2011-10-21 MED ORDER — PANTOPRAZOLE SODIUM 40 MG PO TBEC
40.0000 mg | DELAYED_RELEASE_TABLET | Freq: Every day | ORAL | Status: DC
  Administered 2011-10-22: 40 mg via ORAL
  Filled 2011-10-21: qty 1

## 2011-10-21 MED ORDER — LORAZEPAM 1 MG PO TABS: 1.0000 mg | ORAL_TABLET | Freq: Three times a day (TID) | ORAL | Status: DC | PRN

## 2011-10-21 MED ORDER — INSULIN ASPART 100 UNIT/ML ~~LOC~~ SOLN
6.0000 [IU] | Freq: Three times a day (TID) | SUBCUTANEOUS | Status: DC
  Administered 2011-10-21 – 2011-10-22 (×5): 6 [IU] via SUBCUTANEOUS

## 2011-10-21 MED ORDER — INSULIN ASPART 100 UNIT/ML ~~LOC~~ SOLN: 0.0000 [IU] | Freq: Every day | SUBCUTANEOUS | Status: DC

## 2011-10-21 MED ORDER — IBUPROFEN 100 MG/5ML PO SUSP
600.0000 mg | Freq: Once | ORAL | Status: AC
  Administered 2011-10-21: 600 mg via ORAL
  Filled 2011-10-21: qty 30

## 2011-10-21 MED ORDER — TRAMADOL HCL 50 MG PO TABS: 25.0000 mg | ORAL_TABLET | Freq: Three times a day (TID) | ORAL | Status: DC | PRN

## 2011-10-21 MED ORDER — ADULT MULTIVITAMIN W/MINERALS CH
1.0000 | ORAL_TABLET | Freq: Every day | ORAL | Status: DC
  Administered 2011-10-22: 1 via ORAL
  Filled 2011-10-21 (×2): qty 1

## 2011-10-21 MED ORDER — INSULIN ASPART 100 UNIT/ML ~~LOC~~ SOLN: 0.0000 [IU] | Freq: Three times a day (TID) | SUBCUTANEOUS | Status: DC

## 2011-10-21 MED ORDER — INSULIN ASPART 100 UNIT/ML ~~LOC~~ SOLN
0.0000 [IU] | Freq: Three times a day (TID) | SUBCUTANEOUS | Status: DC
  Administered 2011-10-21 – 2011-10-22 (×2): 2 [IU] via SUBCUTANEOUS

## 2011-10-21 NOTE — Progress Notes (Signed)
[facility] PROGRESS NOTE  [name] ZOX:[id number] DOB: [birth year] DOA: 10/18/2011 PCP: No primary provider on file.  Assessment/Plan: Principal Problem:  *DKA, type 1, not at goal Active Problems:  Altered mental status  Acute renal failure  Dehydration  Anemia  Encephalopathy, metabolic  Hypophosphatasia  Hyponatremia  1. DKA in patient with uncontrolled type 1 diabetes mellitus: Patient was treated with IV fluids and insulin drip on admission and then attempts to transitioned to Lantus on 8/27 were unsuccessful and she went back into DKA. She was aggressively hydrated and insulin drip was continued. Finally last night her DKA resolved and she was transitioned to Lantus and NovoLog. Patient indicates that her insulin for pump orders are on back order and hence she will have to be discharged on Lantus and NovoLog. Counseled regarding compliance with medications and M.D. followups which she verbalized understanding. Diabetes coordinator to follow. 2. Dehydration: Resolved. DC IV fluids. 3. Anemia: Hemoglobin probably at baseline. Stable. 4. Acute renal failure: Secondary to dehydration and DKA: Resolved. 5. Encephalopathy: Secondary to DKA-resolved. 6. Hypophosphatemia: Status post IV runs. Improved. 7. Hyponatremia: probably combination of pseudohyponatremia from hyperglycemia and dehydration. Improved and stable.  Code Status:  full  Family Communication:  none  Disposition Plan: We'll transfer to regular bed today and possible discharge home on 8/30.   Brief narrative: 36 yowf with PMH of anxiety / panic attacks, back pain, ? Thyroid disease (no home meds listed) who presented to [facility] ED on 8/26 via EMS with a several hour history of altered mental status and readings of "high" on glucose meter. Per her significant other the company that provides her insulin pump supplies had not sent supplies for 2 weeks prior to presentation and she has been self injecting her own  insulin for the week prior to admit. Evaluation demonstrated serum glucose of 1016, acute renal failure, hyponatremia, profound metabolic acidosis and significant agitation. PCCM admitted patient.    Consultants:   Critical Care Medicine: Dr. Craige Cotta  Procedures:   none   Antibiotics:   none   HPI/Subjective:  Patient denies complaints. Patient takes clonidine for tremors and Ativan for panic attacks.  Objective: Filed Vitals:   10/21/11 0600 10/21/11 0800 10/21/11 1200 10/21/11 1400  BP:  102/65  109/74  Pulse: 89 90  103  Temp:  97.8 F (36.6 C) 97.2 F (36.2 C) 98.8 F (37.1 C)  TempSrc:  Axillary Axillary Oral  Resp: 15 15  18   Height:    5\' 4"  (1.626 m)  Weight:    51.1 kg (112 lb 10.5 oz)  SpO2: 100% 100%  98%    Intake/Output Summary (Last 24 hours) at 10/21/11 1532 Last data filed at 10/21/11 1300  Gross per 24 hour  Intake 3902.3 ml  Output   3601 ml  Net  301.3 ml   Filed Weights   10/18/11 1524 10/19/11 0500 10/21/11 1400  Weight: 43.545 kg (96 lb) 48.6 kg (107 lb 2.3 oz) 51.1 kg (112 lb 10.5 oz)    Exam:  General exam: Moderately built and nourished female lying comfortably in bed without any distress. Respiratory system: Clear to auscultation. No increased work of breathing. Cardiovascular system: First and second heart sounds heard, regular rate and rhythm. No JVD, murmur pedal edema. Telemetry shows sinus rhythm. Gastrointestinal system: Abdomen is nondistended, soft and nontender. Normal bowel sounds heard. Central nervous system: Alert and oriented. No focal neurological deficits. Extremities: Symmetric 5 x 5 power. Psychiatric: Pleasant and appropriate. Cooperative with interview  and exam.  Data Reviewed: Basic Metabolic Panel:  Lab 10/21/11 1610 10/21/11 0212 10/21/11 10/20/11 2155 10/20/11 1955 10/20/11 1311 10/20/11 0540 10/19/11 1659 10/19/11 0445  NA 134* 136 133* 133* 134* -- -- -- --  K 3.8 3.4* 2.7* 3.6 4.3 -- -- -- --  CL 107 108  101 104 106 -- -- -- --  CO2 20 20 19  16* 15* -- -- -- --  GLUCOSE 202* 127* 146* 213* 136* -- -- -- --  BUN <3* <3* <3* <3* 3* -- -- -- --  CREATININE 0.47* 0.48* 0.51 0.50 0.50 -- -- -- --  CALCIUM 7.5* 7.9* 7.9* 7.5* 7.6* -- -- -- --  MG -- -- -- -- -- -- 1.6 1.8 1.4*  PHOS -- -- -- -- -- 2.1* 0.9* 1.5* 1.8*   Liver Function Tests:  Lab 10/20/11 0540 10/18/11 1305  AST 43* 20  ALT 21 17  ALKPHOS 60 113  BILITOT 0.4 0.2*  PROT 6.3 8.2  ALBUMIN 3.3* 4.7    Lab 10/19/11 1130  LIPASE 7*  AMYLASE 308*   No results found for this basename: AMMONIA:5 in the last 168 hours CBC:  Lab 10/21/11 0355 10/20/11 0540 10/19/11 1953 10/19/11 0445 10/18/11 1542  WBC 4.1 8.0 9.0 10.3 35.2*  NEUTROABS -- -- -- -- --  HGB 9.0* 11.0* 9.1* 8.7* 11.0*  HCT 24.2* 30.1* 24.9* 23.8* 31.7*  MCV 89.6 89.1 88.0 88.8 95.5  PLT 148* 185 173 168 358   Cardiac Enzymes: No results found for this basename: CKTOTAL:5,CKMB:5,CKMBINDEX:5,TROPONINI:5 in the last 168 hours BNP (last 3 results) No results found for this basename: PROBNP:3 in the last 8760 hours CBG:  Lab 10/21/11 1132 10/21/11 0920 10/21/11 0735 10/21/11 0321 10/21/11 0221  GLUCAP 83 177* 215* 180* 122*    Recent Results (from the past 240 hour(s))  MRSA PCR SCREENING     Status: Normal   Collection Time   10/18/11  9:28 PM      Component Value Range Status Comment   MRSA by PCR NEGATIVE  NEGATIVE Final      Studies: Dg Chest 1 View  10/18/2011  *RADIOLOGY REPORT*  Clinical Data: Hyperglycemia.  Line placement.  Unresponsive.  CHEST - 1 VIEW  Comparison: 04/30/2011 radiographs.  Findings: Right IJ central venous catheter tip is at the level of the SVC right atrial junction, similar to prior study.  The heart size and mediastinal contours are stable.  There are surgical clips at the thoracic inlet suggesting prior thyroid or parathyroid surgery.  The lungs are clear.  There is no pleural effusion or pneumothorax.  IMPRESSION: Central  venous catheter positioned as above.  No active cardiopulmonary process.   Original Report Authenticated By: Gerrianne Scale, M.D.     Scheduled Meds:    . enoxaparin  40 mg Subcutaneous Q24H  . ibuprofen  600 mg Oral Once  . insulin aspart  0-5 Units Subcutaneous QHS  . insulin aspart  0-5 Units Subcutaneous QHS  . insulin aspart  0-9 Units Subcutaneous TID WC  . insulin aspart  6 Units Subcutaneous TID WC  . insulin glargine  22 Units Subcutaneous QHS  . pantoprazole  20 mg Oral Q1200  . potassium chloride  10 mEq Intravenous Q1 Hr x 4  . potassium chloride      . potassium chloride  40 mEq Oral Once  . DISCONTD: insulin aspart  0-15 Units Subcutaneous TID WC  . DISCONTD: insulin aspart  0-20 Units Subcutaneous TID  WC  . DISCONTD: insulin aspart  3 Units Subcutaneous TID WC  . DISCONTD: insulin glargine  20 Units Subcutaneous QHS  . DISCONTD: potassium chloride  10 mEq Intravenous Q1H  . DISCONTD: potassium chloride  10 mEq Intravenous Q1 Hr x 2  . DISCONTD: potassium chloride  10 mEq Intravenous Q1 Hr x 3  . DISCONTD: potassium chloride  10 mEq Intravenous Q1 Hr x 2   Continuous Infusions:    . sodium chloride 10 mL/hr at 10/20/11 1759  . DISCONTD: dextrose 5 % and 0.45 % NaCl with KCl 10 mEq/L 200 mL/hr at 10/21/11 0630  . DISCONTD: insulin (NOVOLIN-R) infusion 2.9 Units/hr (10/20/11 2242)    Time spent:  25 minutes.    [facility]  [facility] Pager [phone] .  If 8PM-8AM, please contact night-coverage at [email or web address], password [facility] 10/21/2011, 3:31 PM  LOS: 3 days

## 2011-10-21 NOTE — Progress Notes (Signed)
Inpatient Diabetes Program Recommendations  AACE/ADA: New Consensus Statement on Inpatient Glycemic Control (2013)  Target Ranges:  Prepandial:   less than 140 mg/dL      Peak postprandial:   less than 180 mg/dL (1-2 hours)      Critically ill patients:  140 - 180 mg/dL   Fasting ZOX=096  Inpatient Diabetes Program Recommendations Insulin - Basal: Increase Lantus to 22 units  Correction (SSI): Decrease correction scale to sensitive (type 1 DM)    Will follow.  Thank you  Piedad Cl[facility] Inpatient Diabetes Coordinator [phone] (team pager) [phone] office

## 2011-10-22 DIAGNOSIS — G9341 Metabolic encephalopathy: Secondary | ICD-10-CM

## 2011-10-22 LAB — GLUCOSE, CAPILLARY: Glucose-Capillary: 182 mg/dL — ABNORMAL HIGH (ref 70–99)

## 2011-10-22 LAB — CBC
MCV: 93.8 fL (ref 78.0–100.0)
Platelets: 145 10*3/uL — ABNORMAL LOW (ref 150–400)
RBC: 2.92 MIL/uL — ABNORMAL LOW (ref 3.87–5.11)
WBC: 3.2 10*3/uL — ABNORMAL LOW (ref 4.0–10.5)

## 2011-10-22 LAB — BASIC METABOLIC PANEL
CO2: 21 mEq/L (ref 19–32)
Calcium: 7.8 mg/dL — ABNORMAL LOW (ref 8.4–10.5)
GFR calc Af Amer: >90 mL/min (ref >90)
Sodium: 139 mEq/L (ref 135–145)

## 2011-10-22 MED ORDER — INSULIN GLARGINE 100 UNIT/ML ~~LOC~~ SOLN: 22.0000 [IU] | Freq: Every day | SUBCUTANEOUS | Status: DC

## 2011-10-22 MED ORDER — INSULIN ASPART 100 UNIT/ML ~~LOC~~ SOLN: 6.0000 [IU] | Freq: Three times a day (TID) | SUBCUTANEOUS | Status: DC

## 2011-10-22 NOTE — Discharge Summary (Signed)
Physician Discharge Summary  [name] ZOX:[id number] DOB: 01/30/1981 DOA: 10/18/2011  PCP: No primary provider on file.  Admit date: 10/18/2011 Discharge date: 10/22/2011  Recommendations for Outpatient Follow-up:  Home on lantus and premeal insulin . counseled on medication and dietary  compliance and frequent blood glucose monitoring and establishing PCP in the community.  Discharge Diagnoses:  Principal Problem:  *DKA, type 1, not at goal  Active Problems:  Altered mental status  Acute renal failure  Dehydration  Anemia  Encephalopathy, metabolic  Hypophosphatasia  Hyponatremia   Discharge Condition: fair  Diet recommendation: diabetic  Filed Weights   10/19/11 0500 10/21/11 1400 10/22/11 0552  Weight: 48.6 kg (107 lb 2.3 oz) 51.1 kg (112 lb 10.5 oz) 54.6 kg (120 lb 5.9 oz)    History of present illness:  1 yowf with PMH of anxiety / panic attacks, back pain, ? Thyroid disease (no home meds listed) who presented to [facility] ED on 8/26 via EMS with a several hour history of altered mental status and readings of "high" on glucose meter. Per her significant other the company that provides her insulin pump supplies had not sent supplies for 2 weeks prior to presentation and she has been self injecting her own insulin for the week prior to admit. Evaluation demonstrated serum glucose of 1016, acute renal failure, hyponatremia, profound metabolic acidosis and significant agitation. PCCM admitted patient and transferred to hospitalist service once stable.    Hospital Course:   1. DKA in patient with uncontrolled type 1 diabetes mellitus:  Patient was treated with IV fluids and insulin drip on admission and then attempts to transition to Lantus on 8/27 were unsuccessful and she went back into DKA. She was aggressively hydrated and insulin drip was continued. Finally last night her DKA resolved and she was transitioned to Lantus and NovoLog. Patient indicates that her insulin for  pump orders are on back order and hence she will have to be discharged on Lantus and NovoLog. She will be discharged on 22 units lantus and 6 units premeal novolog.Counseled regarding compliance with medications and M.D. followups which she verbalized understanding.  She was following [facility] and Dr Sharl Ma previously and likely due to non compliance has been fired from the practice. She has been given a list of providers in the community and is willing to establish care with a PCP as soon as possible.   2. Dehydration:  Resolved. Off  IV fluids.  3. Anemia:  Hemoglobin seems at baseline. Stable.  4. Acute renal failure:  Secondary to dehydration and DKA: Resolved.  5. Encephalopathy:  Secondary to DKA. Now resolved.  6. Hypophosphatemia:  Improved with IV supplements.  7. Hyponatremia: probably combination of pseudohyponatremia from hyperglycemia and dehydration. Improved and stable.   Procedures:  none  Consultations:  PCCM  Discharge Exam: Filed Vitals:   10/22/11 0552  BP: 98/64  Pulse: 80  Temp: 98.4 F (36.9 C)  Resp: 20   Filed Vitals:   10/21/11 1805 10/21/11 2215 10/22/11 0200 10/22/11 0552  BP: 108/71 104/72 111/84 98/64  Pulse: 84 90 80 80  Temp: 98.4 F (36.9 C) 98.5 F (36.9 C) 98.4 F (36.9 C) 98.4 F (36.9 C)  TempSrc: Oral Oral Oral Oral  Resp: 20 20 20 20   Height:      Weight:    54.6 kg (120 lb 5.9 oz)  SpO2: 100% 100% 100% 98%    General: young female in NAD Heent: No pallor, moist oral mucosa Cardiovascular:  N s1&s2, No murmurs Respiratory: clear b/l Abd: soft, NT, ND BS+ Ext: warm, no edema  Discharge Instructions   Medication List  As of 10/22/2011 11:24 AM   STOP taking these medications         insulin pump 100 unit/ml Soln         TAKE these medications         cloNIDine 0.1 MG tablet   Commonly known as: CATAPRES   Take 0.05 mg by mouth 2 (two) times daily.      insulin aspart 100 UNIT/ML injection    Commonly known as: novoLOG   Inject 6 Units into the skin 3 (three) times daily with meals.      insulin glargine 100 UNIT/ML injection   Commonly known as: LANTUS   Inject 22 Units into the skin at bedtime.      LORazepam 1 MG tablet   Commonly known as: ATIVAN   Take 1 mg by mouth every 8 (eight) hours as needed. Anxiety      multivitamin with minerals Tabs   Take 1 tablet by mouth daily.      omeprazole 20 MG capsule   Commonly known as: PRILOSEC   Take 20 mg by mouth daily.      traMADol 50 MG tablet   Commonly known as: ULTRAM   Take 25-50 mg by mouth every 8 (eight) hours as needed. For pain.           Follow-up Information    Please follow up. (patient trying to establish a PCP in the community)           The results of significant diagnostics from this hospitalization (including imaging, microbiology, ancillary and laboratory) are listed below for reference.    Significant Diagnostic Studies: Dg Chest 1 View  10/18/2011  *RADIOLOGY REPORT*  Clinical Data: Hyperglycemia.  Line placement.  Unresponsive.  CHEST - 1 VIEW  Comparison: 04/30/2011 radiographs.  Findings: Right IJ central venous catheter tip is at the level of the SVC right atrial junction, similar to prior study.  The heart size and mediastinal contours are stable.  There are surgical clips at the thoracic inlet suggesting prior thyroid or parathyroid surgery.  The lungs are clear.  There is no pleural effusion or pneumothorax.  IMPRESSION: Central venous catheter positioned as above.  No active cardiopulmonary process.   Original Report Authenticated By: Gerrianne Scale, M.D.     Microbiology: Recent Results (from the past 240 hour(s))  MRSA PCR SCREENING     Status: Normal   Collection Time   10/18/11  9:28 PM      Component Value Range Status Comment   MRSA by PCR NEGATIVE  NEGATIVE Final      Labs: Basic Metabolic Panel:  Lab 10/22/11 1610 10/21/11 0355 10/21/11 0212 10/21/11 10/20/11 2155  10/20/11 1311 10/20/11 0540 10/19/11 1659 10/19/11 0445  NA 139 134* 136 133* 133* -- -- -- --  K 3.7 3.8 3.4* 2.7* 3.6 -- -- -- --  CL 110 107 108 101 104 -- -- -- --  CO2 21 20 20 19  16* -- -- -- --  GLUCOSE 247* 202* 127* 146* 213* -- -- -- --  BUN 8 <3* <3* <3* <3* -- -- -- --  CREATININE 0.54 0.47* 0.48* 0.51 0.50 -- -- -- --  CALCIUM 7.8* 7.5* 7.9* 7.9* 7.5* -- -- -- --  MG -- -- -- -- -- -- 1.6 1.8 1.4*  PHOS -- -- -- -- --  2.1* 0.9* 1.5* 1.8*   Liver Function Tests:  Lab 10/20/11 0540 10/18/11 1305  AST 43* 20  ALT 21 17  ALKPHOS 60 113  BILITOT 0.4 0.2*  PROT 6.3 8.2  ALBUMIN 3.3* 4.7    Lab 10/19/11 1130  LIPASE 7*  AMYLASE 308*   No results found for this basename: AMMONIA:5 in the last 168 hours CBC:  Lab 10/22/11 0340 10/21/11 0355 10/20/11 0540 10/19/11 1953 10/19/11 0445  WBC 3.2* 4.1 8.0 9.0 10.3  NEUTROABS -- -- -- -- --  HGB 9.5* 9.0* 11.0* 9.1* 8.7*  HCT 27.4* 24.2* 30.1* 24.9* 23.8*  MCV 93.8 89.6 89.1 88.0 88.8  PLT 145* 148* 185 173 168   Cardiac Enzymes: No results found for this basename: CKTOTAL:5,CKMB:5,CKMBINDEX:5,TROPONINI:5 in the last 168 hours BNP: BNP (last 3 results) No results found for this basename: PROBNP:3 in the last 8760 hours CBG:  Lab 10/22/11 0718 10/21/11 2208 10/21/11 1650 10/21/11 1132 10/21/11 0920  GLUCAP 182* 188* 167* 83 177*    Time coordinating discharge: 45  minutes  Signed:  Armanie Ullmer  [facility] 10/22/2011, 11:24 AM

## 2011-10-22 NOTE — Progress Notes (Signed)
DC to home. To car by w.c. No change from AM assessment.[name]  

## 2011-10-22 NOTE — Progress Notes (Signed)
Inpatient Diabetes Program Recommendations  AACE/ADA: New Consensus Statement on Inpatient Glycemic Control (2013)  Target Ranges:  Prepandial:   less than 140 mg/dL      Peak postprandial:   less than 180 mg/dL (1-2 hours)      Critically ill patients:  140 - 180 mg/dL   Reason for Visit: Patient with Type 1 diabetes.  History of wearing [employer] insulin pump, however plans are for her to discharge on Lantus/Novolog regimen.  Note that Lab glucose at  0340 was 247 mg/dL.  Consider slight increase of Lantus to 24 units q HS.  Will need close follow-up with PCP.

## 2012-02-03 ENCOUNTER — Inpatient Hospital Stay (HOSPITAL_COMMUNITY)
Admission: EM | Admit: 2012-02-03 | Discharge: 2012-02-07 | DRG: 638 | Disposition: A | Discharge status: Home / Self Care | Payer: Medicaid Other | Attending: Internal Medicine | Admitting: Internal Medicine

## 2012-02-03 ENCOUNTER — Emergency Department (HOSPITAL_COMMUNITY): Payer: Medicaid Other

## 2012-02-03 DIAGNOSIS — R112 Nausea with vomiting, unspecified: Secondary | ICD-10-CM

## 2012-02-03 DIAGNOSIS — N390 Urinary tract infection, site not specified: Secondary | ICD-10-CM

## 2012-02-03 DIAGNOSIS — N179 Acute kidney failure, unspecified: Secondary | ICD-10-CM | POA: Diagnosis present

## 2012-02-03 DIAGNOSIS — E1010 Type 1 diabetes mellitus with ketoacidosis without coma: Principal | ICD-10-CM | POA: Diagnosis present

## 2012-02-03 DIAGNOSIS — D649 Anemia, unspecified: Secondary | ICD-10-CM

## 2012-02-03 DIAGNOSIS — E871 Hypo-osmolality and hyponatremia: Secondary | ICD-10-CM | POA: Diagnosis present

## 2012-02-03 DIAGNOSIS — E1110 Type 2 diabetes mellitus with ketoacidosis without coma: Secondary | ICD-10-CM

## 2012-02-03 DIAGNOSIS — E860 Dehydration: Secondary | ICD-10-CM

## 2012-02-03 DIAGNOSIS — E876 Hypokalemia: Secondary | ICD-10-CM | POA: Diagnosis present

## 2012-02-03 DIAGNOSIS — N12 Tubulo-interstitial nephritis, not specified as acute or chronic: Secondary | ICD-10-CM

## 2012-02-03 LAB — BLOOD GAS, ARTERIAL
Bicarbonate: 6.1 mEq/L — ABNORMAL LOW (ref 20.0–24.0)
FIO2: 0.21 %
Patient temperature: 98.6
TCO2: 5.6 mmol/L (ref 0–100)
pCO2 arterial: 13.9 mmHg — CL (ref 35.0–45.0)
pH, Arterial: 7.269 — ABNORMAL LOW (ref 7.350–7.450)

## 2012-02-03 LAB — CBC WITH DIFFERENTIAL/PLATELET
Basophils Absolute: 0 10*3/uL (ref 0.0–0.1)
Basophils Relative: 0 % (ref 0–1)
Eosinophils Relative: 0 % (ref 0–5)
HCT: 39.3 % (ref 36.0–46.0)
Hemoglobin: 14 g/dL (ref 12.0–15.0)
MCH: 31.3 pg (ref 26.0–34.0)
MCHC: 35.6 g/dL (ref 30.0–36.0)
MCV: 87.7 fL (ref 78.0–100.0)
Monocytes Absolute: 0.4 10*3/uL (ref 0.1–1.0)
Monocytes Relative: 4 % (ref 3–12)
RDW: 12.5 % (ref 11.5–15.5)

## 2012-02-03 LAB — POCT I-STAT, CHEM 8
BUN: 18 mg/dL (ref 6–23)
Hemoglobin: 15 g/dL (ref 12.0–15.0)
Potassium: 3.5 mEq/L (ref 3.5–5.1)
Sodium: 128 mEq/L — ABNORMAL LOW (ref 135–145)
TCO2: 11 mmol/L (ref 0–100)

## 2012-02-03 LAB — URINALYSIS, ROUTINE W REFLEX MICROSCOPIC
Glucose, UA: >1000 mg/dL — AB
Ketones, ur: >80 mg/dL — AB
Protein, ur: >300 mg/dL — AB

## 2012-02-03 LAB — URINE MICROSCOPIC-ADD ON

## 2012-02-03 LAB — POCT PREGNANCY, URINE: Preg Test, Ur: NEGATIVE

## 2012-02-03 MED ORDER — ONDANSETRON HCL 4 MG/2ML IJ SOLN
4.0000 mg | Freq: Once | INTRAMUSCULAR | Status: AC
  Administered 2012-02-04: 4 mg via INTRAVENOUS
  Filled 2012-02-03: qty 2

## 2012-02-03 MED ORDER — SODIUM CHLORIDE 0.9 % IV SOLN
1000.0000 mL | Freq: Once | INTRAVENOUS | Status: AC
  Administered 2012-02-04: 1000 mL via INTRAVENOUS

## 2012-02-03 MED ORDER — CEFTRIAXONE SODIUM 1 G IJ SOLR
1.0000 g | INTRAMUSCULAR | Status: DC
  Administered 2012-02-04 – 2012-02-06 (×4): 1 g via INTRAVENOUS
  Filled 2012-02-03 (×4): qty 10

## 2012-02-03 MED ORDER — FENTANYL CITRATE 0.05 MG/ML IJ SOLN
50.0000 ug | Freq: Once | INTRAMUSCULAR | Status: AC
  Administered 2012-02-04: 50 ug via INTRAVENOUS
  Filled 2012-02-03: qty 2

## 2012-02-03 MED ORDER — SODIUM CHLORIDE 0.9 % IV SOLN
INTRAVENOUS | Status: DC
  Administered 2012-02-04: 01:00:00 via INTRAVENOUS
  Filled 2012-02-03: qty 1

## 2012-02-03 MED ORDER — SODIUM CHLORIDE 0.9 % IV SOLN
1000.0000 mL | INTRAVENOUS | Status: DC
  Administered 2012-02-04: 1000 mL via INTRAVENOUS

## 2012-02-03 MED ORDER — SODIUM CHLORIDE 0.9 % IV SOLN: 1000.0000 mL | Freq: Once | INTRAVENOUS | Status: DC

## 2012-02-03 MED ORDER — POTASSIUM CHLORIDE 10 MEQ/100ML IV SOLN
10.0000 meq | Freq: Once | INTRAVENOUS | Status: AC
  Administered 2012-02-04: 10 meq via INTRAVENOUS
  Filled 2012-02-03: qty 100

## 2012-02-03 NOTE — ED Notes (Signed)
Attempted IV start x 1. IV team RN paged and returned call.

## 2012-02-03 NOTE — ED Notes (Signed)
RT paged for blood gas

## 2012-02-03 NOTE — ED Notes (Signed)
Pt presents with bilateral flank pain-associated with dizziness for 2 days.  Pt c/o'ed SOB to EMS with dizziness. BP 90/40 per EMS. CBG 339.  Pt has hx DKA. Pt stated "tea colored" urine tonight-nausea wit no vomiting and flank pain has increased.

## 2012-02-03 NOTE — ED Provider Notes (Signed)
History     CSN: [id number]  Arrival date & time 02/03/12  2154   First MD Initiated Contact with Patient 02/03/12 2203      Chief Complaint  Patient presents with  . Flank Pain    HPI Pt has been having bilateral flank pain associated with dark urine.  That started yesterday but has increased.  She feels weak all over and feels like she has the flu.  No cough.  No vomiting or diarrhea.  She has had nausea.  Pt felt like she was getting short of breath and worried that she was going into DKA.  Past Medical History  Diagnosis Date  . Diabetes mellitus   . Thyroid disease   . Back pain   . Panic attack   . PONV (postoperative nausea and vomiting)   . Anxiety   . GERD (gastroesophageal reflux disease)     Past Surgical History  Procedure Date  . Laparoscopy     age 31  . Back surgery     age 31    No family history on file.  History  Substance Use Topics  . Smoking status: Former Smoker    Quit date: 04/19/2000  . Smokeless tobacco: Former Neurosurgeon  . Alcohol Use: Yes     Comment: social    OB History    Grav Para Term Preterm Abortions TAB SAB Ect Mult Living                  Review of Systems  All other systems reviewed and are negative.    Allergies  Scallops and Morphine and related  Home Medications   Current Outpatient Rx  Name  Route  Sig  Dispense  Refill  . CLONIDINE HCL 0.1 MG PO TABS   Oral   Take 0.05 mg by mouth 2 (two) times daily.         . INSULIN ASPART 100 UNIT/ML [place] SOLN   Subcutaneous   Inject 6 Units into the skin 3 (three) times daily with meals.   1 vial   2   . INSULIN GLARGINE 100 UNIT/ML [place] SOLN   Subcutaneous   Inject 22 Units into the skin at bedtime.   10 mL   2   . LORAZEPAM 1 MG PO TABS   Oral   Take 1 mg by mouth every 8 (eight) hours as needed. Anxiety         . ADULT MULTIVITAMIN W/MINERALS CH   Oral   Take 1 tablet by mouth daily.         Marland Kitchen OMEPRAZOLE 20 MG PO CPDR   Oral   Take 20 mg by mouth  daily.         . TRAMADOL HCL 50 MG PO TABS   Oral   Take 25-50 mg by mouth every 8 (eight) hours as needed. For pain.           BP 109/70  Pulse 94  Temp 97.4 F (36.3 C) (Oral)  Resp 16  SpO2 100%  Physical Exam  Nursing note and vitals reviewed. Constitutional: She appears well-developed and well-nourished. No distress.  HENT:  Head: Normocephalic and atraumatic.  Right Ear: External ear normal.  Left Ear: External ear normal.  Eyes: Conjunctivae normal are normal. Right eye exhibits no discharge. Left eye exhibits no discharge. No scleral icterus.  Neck: Neck supple. No tracheal deviation present.  Cardiovascular: Normal rate, regular rhythm and intact distal pulses.   Pulmonary/Chest: Effort  normal and breath sounds normal. No stridor. No respiratory distress. She has no wheezes. She has no rales.  Abdominal: Soft. Bowel sounds are normal. She exhibits no distension. There is no tenderness. There is no rebound and no guarding.  Musculoskeletal: She exhibits no edema and no tenderness.  Neurological: She is alert. She has normal strength. No sensory deficit. Cranial nerve deficit:  no gross defecits noted. She exhibits normal muscle tone. She displays no seizure activity. Coordination normal.  Skin: Skin is warm and dry. No rash noted.  Psychiatric: She has a normal mood and affect.    ED Course  Procedures (including critical care time) CRITICAL CARE Performed by: Linwood Dibbles R Total critical care time: 35 Critical care time was exclusive of separately billable procedures and treating other patients. Critical care was necessary to treat or prevent imminent or life-threatening deterioration. Critical care was time spent personally by me on the following activities: development of treatment plan with patient and/or surrogate as well as nursing, discussions with consultants, evaluation of patient's response to treatment, examination of patient, obtaining history from  patient or surrogate, ordering and performing treatments and interventions, ordering and review of laboratory studies, ordering and review of radiographic studies, pulse oximetry and re-evaluation of patient's condition.  US guided peripheral IV Dr. Silverio Lay and I performed US guided peripheral IV.  Placed first attempt, left basilic, 18 gauge angiocath.  Good return.  Easily flused.   Medications  0.9 %  sodium chloride infusion (not administered)    Followed by  0.9 %  sodium chloride infusion (not administered)    Followed by  0.9 %  sodium chloride infusion (not administered)  ondansetron (ZOFRAN) injection 4 mg (not administered)  fentaNYL (SUBLIMAZE) injection 50 mcg (not administered)  cefTRIAXone (ROCEPHIN) 1 g in dextrose 5 % 50 mL IVPB (not administered)  insulin regular (NOVOLIN R,HUMULIN R) 1 Units/mL in sodium chloride 0.9 % 100 mL infusion (not administered)  potassium chloride 10 mEq in 100 mL IVPB (not administered)  sodium chloride 0.9 % bolus 1,000 mL (not administered)     Labs Reviewed  URINALYSIS, ROUTINE W REFLEX MICROSCOPIC - Abnormal; Notable for the following:    Color, Urine RED (*)  BIOCHEMICALS MAY BE AFFECTED BY COLOR   APPearance TURBID (*)     Glucose, UA >1000 (*)     Hgb urine dipstick LARGE (*)     Bilirubin Urine LARGE (*)     Ketones, ur >80 (*)     Protein, ur >300 (*)     Leukocytes, UA MODERATE (*)     All other components within normal limits  CBC WITH DIFFERENTIAL - Abnormal; Notable for the following:    Neutrophils Relative 86 (*)     Lymphocytes Relative 9 (*)     All other components within normal limits  COMPREHENSIVE METABOLIC PANEL - Abnormal; Notable for the following:    Sodium 126 (*)     Potassium 3.4 (*)     Chloride 83 (*)     CO2 9 (*)     Glucose, Bld 393 (*)     Total Protein 9.4 (*)     GFR calc non Af Amer 74 (*)     GFR calc Af Amer 86 (*)     All other components within normal limits  BLOOD GAS, ARTERIAL - Abnormal;  Notable for the following:    pH, Arterial 7.269 (*)     pCO2 arterial 13.9 (*)     pO2, Arterial  120.0 (*)     Bicarbonate 6.1 (*)     Acid-base deficit 19.4 (*)     All other components within normal limits  POCT I-STAT, CHEM 8 - Abnormal; Notable for the following:    Sodium 128 (*)     Glucose, Bld 376 (*)     All other components within normal limits  POCT PREGNANCY, URINE  URINE MICROSCOPIC-ADD ON  URINE CULTURE   Dg Chest 2 View  02/03/2012  *RADIOLOGY REPORT*  Clinical Data: Bilateral flank pain.  Back pain.  Short of breath.  CHEST - 2 VIEW  Comparison: 10/18/2011.  Findings: Bilateral nipple studs are present.  Cardiopericardial silhouette within normal limits. Mediastinal contours normal. Trachea midline.  No airspace disease or effusion.  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  IMPRESSION: No active cardiopulmonary disease.   Original Report Authenticated By: Andreas Newport, M.D.      1. DKA (diabetic ketoacidoses)   2. UTI (lower urinary tract infection)      MDM  Pt appears to have recurrent DKA.  Initial ABG shows a pH of 7.269.  UA also consistent with uti.  Likely the precipitating  Cause.  IV fluids, pain meds, and antiemetics.  IV rocephin ordered.  Plan on admission for further  Treatment and evaluation.  IV insulin ordered.  Potassium IV ordered as well.     Celene Kras, MD 02/04/12 [phone]

## 2012-02-03 NOTE — ED Notes (Signed)
ZOX:[id number]<AV> Expected date:<BR> Expected time:<BR> Means of arrival:<BR> Comments:<BR> Flank pain/tea colored urine-hx DKA-CBG &gt;300

## 2012-02-03 NOTE — ED Notes (Signed)
IV team RN at bedside.  

## 2012-02-03 NOTE — ED Notes (Signed)
Pt received Zofran 4 mg IM per EMS for nausea-unable to start PIV

## 2012-02-04 ENCOUNTER — Inpatient Hospital Stay (HOSPITAL_COMMUNITY): Payer: Medicaid Other

## 2012-02-04 ENCOUNTER — Encounter (HOSPITAL_COMMUNITY): Payer: Self-pay | Admitting: Internal Medicine

## 2012-02-04 DIAGNOSIS — E860 Dehydration: Secondary | ICD-10-CM

## 2012-02-04 DIAGNOSIS — R112 Nausea with vomiting, unspecified: Secondary | ICD-10-CM

## 2012-02-04 LAB — BASIC METABOLIC PANEL
BUN: 12 mg/dL (ref 6–23)
BUN: 9 mg/dL (ref 6–23)
BUN: 7 mg/dL (ref 6–23)
CO2: 13 mEq/L — ABNORMAL LOW (ref 19–32)
CO2: 17 mEq/L — ABNORMAL LOW (ref 19–32)
CO2: 24 mEq/L (ref 19–32)
Calcium: 8.5 mg/dL (ref 8.4–10.5)
Chloride: 99 mEq/L (ref 96–112)
Chloride: 98 mEq/L (ref 96–112)
Creatinine, Ser: 0.74 mg/dL (ref 0.50–1.10)
Creatinine, Ser: 0.64 mg/dL (ref 0.50–1.10)
GFR calc Af Amer: >90 mL/min (ref >90)
GFR calc non Af Amer: >90 mL/min (ref >90)
Glucose, Bld: 241 mg/dL — ABNORMAL HIGH (ref 70–99)
Glucose, Bld: 141 mg/dL — ABNORMAL HIGH (ref 70–99)
Glucose, Bld: 155 mg/dL — ABNORMAL HIGH (ref 70–99)
Potassium: 3.4 mEq/L — ABNORMAL LOW (ref 3.5–5.1)
Sodium: 127 mEq/L — ABNORMAL LOW (ref 135–145)

## 2012-02-04 LAB — GLUCOSE, CAPILLARY
Glucose-Capillary: 379 mg/dL — ABNORMAL HIGH (ref 70–99)
Glucose-Capillary: 252 mg/dL — ABNORMAL HIGH (ref 70–99)
Glucose-Capillary: 146 mg/dL — ABNORMAL HIGH (ref 70–99)
Glucose-Capillary: 190 mg/dL — ABNORMAL HIGH (ref 70–99)
Glucose-Capillary: 318 mg/dL — ABNORMAL HIGH (ref 70–99)
Glucose-Capillary: 211 mg/dL — ABNORMAL HIGH (ref 70–99)
Glucose-Capillary: 179 mg/dL — ABNORMAL HIGH (ref 70–99)
Glucose-Capillary: 186 mg/dL — ABNORMAL HIGH (ref 70–99)
Glucose-Capillary: 173 mg/dL — ABNORMAL HIGH (ref 70–99)
Glucose-Capillary: 178 mg/dL — ABNORMAL HIGH (ref 70–99)
Glucose-Capillary: 179 mg/dL — ABNORMAL HIGH (ref 70–99)

## 2012-02-04 LAB — CBC
HCT: 32.7 % — ABNORMAL LOW (ref 36.0–46.0)
Hemoglobin: 11.3 g/dL — ABNORMAL LOW (ref 12.0–15.0)
MCV: 87.2 fL (ref 78.0–100.0)
Platelets: 175 10*3/uL (ref 150–400)
RBC: 3.75 MIL/uL — ABNORMAL LOW (ref 3.87–5.11)
WBC: 6.6 10*3/uL (ref 4.0–10.5)

## 2012-02-04 LAB — COMPREHENSIVE METABOLIC PANEL
AST: 12 U/L (ref 0–37)
Albumin: 3.9 g/dL (ref 3.5–5.2)
BUN: 19 mg/dL (ref 6–23)
Calcium: 10.1 mg/dL (ref 8.4–10.5)
Creatinine, Ser: 1 mg/dL (ref 0.50–1.10)
GFR calc non Af Amer: 74 mL/min — ABNORMAL LOW (ref >90)

## 2012-02-04 MED ORDER — SENNOSIDES-DOCUSATE SODIUM 8.6-50 MG PO TABS
1.0000 | ORAL_TABLET | Freq: Two times a day (BID) | ORAL | Status: DC
  Administered 2012-02-05: 1 via ORAL
  Filled 2012-02-04 (×8): qty 1

## 2012-02-04 MED ORDER — POTASSIUM CHLORIDE CRYS ER 20 MEQ PO TBCR: 20.0000 meq | EXTENDED_RELEASE_TABLET | Freq: Three times a day (TID) | ORAL | Status: DC

## 2012-02-04 MED ORDER — POTASSIUM CHLORIDE CRYS ER 20 MEQ PO TBCR
40.0000 meq | EXTENDED_RELEASE_TABLET | Freq: Once | ORAL | Status: AC
  Administered 2012-02-04: 40 meq via ORAL
  Filled 2012-02-04: qty 2

## 2012-02-04 MED ORDER — INSULIN GLARGINE 100 UNIT/ML ~~LOC~~ SOLN
15.0000 [IU] | Freq: Every day | SUBCUTANEOUS | Status: DC
  Administered 2012-02-04: 15 [IU] via SUBCUTANEOUS

## 2012-02-04 MED ORDER — INSULIN REGULAR BOLUS VIA INFUSION
0.0000 [IU] | Freq: Three times a day (TID) | INTRAVENOUS | Status: DC
  Administered 2012-02-04: 7.9 [IU] via INTRAVENOUS
  Administered 2012-02-04: 5.4 [IU] via INTRAVENOUS
  Administered 2012-02-04: 7.8 [IU] via INTRAVENOUS
  Filled 2012-02-04: qty 10

## 2012-02-04 MED ORDER — DEXTROSE 50 % IV SOLN: 25.0000 mL | INTRAVENOUS | Status: DC | PRN

## 2012-02-04 MED ORDER — INSULIN ASPART 100 UNIT/ML ~~LOC~~ SOLN
3.0000 [IU] | Freq: Three times a day (TID) | SUBCUTANEOUS | Status: DC
  Administered 2012-02-05 – 2012-02-07 (×8): 3 [IU] via SUBCUTANEOUS

## 2012-02-04 MED ORDER — POTASSIUM CHLORIDE 10 MEQ/100ML IV SOLN
10.0000 meq | INTRAVENOUS | Status: AC
  Administered 2012-02-04 (×3): 10 meq via INTRAVENOUS
  Filled 2012-02-04 (×3): qty 100

## 2012-02-04 MED ORDER — ONDANSETRON HCL 4 MG PO TABS
4.0000 mg | ORAL_TABLET | Freq: Four times a day (QID) | ORAL | Status: DC | PRN
  Administered 2012-02-04 – 2012-02-06 (×3): 4 mg via ORAL
  Filled 2012-02-04 (×4): qty 1

## 2012-02-04 MED ORDER — SODIUM CHLORIDE 0.9 % IV BOLUS (SEPSIS)
500.0000 mL | Freq: Once | INTRAVENOUS | Status: AC
  Administered 2012-02-04: 500 mL via INTRAVENOUS

## 2012-02-04 MED ORDER — SODIUM CHLORIDE 0.9 % IJ SOLN
3.0000 mL | Freq: Two times a day (BID) | INTRAMUSCULAR | Status: DC
  Administered 2012-02-04 (×2): 3 mL via INTRAVENOUS

## 2012-02-04 MED ORDER — POTASSIUM CHLORIDE CRYS ER 20 MEQ PO TBCR
40.0000 meq | EXTENDED_RELEASE_TABLET | Freq: Two times a day (BID) | ORAL | Status: DC
  Administered 2012-02-04: 40 meq via ORAL
  Filled 2012-02-04 (×2): qty 2

## 2012-02-04 MED ORDER — DEXTROSE-NACL 5-0.45 % IV SOLN
INTRAVENOUS | Status: DC
  Administered 2012-02-04: 04:00:00 via INTRAVENOUS
  Administered 2012-02-04 (×2): 1000 mL via INTRAVENOUS

## 2012-02-04 MED ORDER — HYDROMORPHONE HCL PF 1 MG/ML IJ SOLN
1.0000 mg | INTRAMUSCULAR | Status: DC | PRN
  Administered 2012-02-04 (×4): 1 mg via INTRAVENOUS
  Filled 2012-02-04 (×4): qty 1

## 2012-02-04 MED ORDER — HEPARIN SODIUM (PORCINE) 5000 UNIT/ML IJ SOLN
5000.0000 [IU] | Freq: Three times a day (TID) | INTRAMUSCULAR | Status: DC
  Administered 2012-02-04 – 2012-02-07 (×11): 5000 [IU] via SUBCUTANEOUS
  Filled 2012-02-04 (×13): qty 1

## 2012-02-04 MED ORDER — HYDROMORPHONE HCL PF 1 MG/ML IJ SOLN
INTRAMUSCULAR | Status: AC
  Administered 2012-02-04: 0.5 mg
  Filled 2012-02-04: qty 1

## 2012-02-04 MED ORDER — SODIUM CHLORIDE 0.9 % IV BOLUS (SEPSIS): 1000.0000 mL | Freq: Once | INTRAVENOUS | Status: DC

## 2012-02-04 MED ORDER — INSULIN GLARGINE 100 UNIT/ML ~~LOC~~ SOLN: 20.0000 [IU] | Freq: Every day | SUBCUTANEOUS | Status: DC

## 2012-02-04 MED ORDER — ONDANSETRON HCL 4 MG/2ML IJ SOLN
4.0000 mg | Freq: Four times a day (QID) | INTRAMUSCULAR | Status: DC | PRN
  Administered 2012-02-05: 4 mg via INTRAVENOUS
  Filled 2012-02-04: qty 2

## 2012-02-04 MED ORDER — POTASSIUM CHLORIDE CRYS ER 20 MEQ PO TBCR: 40.0000 meq | EXTENDED_RELEASE_TABLET | Freq: Once | ORAL | Status: DC

## 2012-02-04 MED ORDER — POTASSIUM CHLORIDE CRYS ER 20 MEQ PO TBCR
40.0000 meq | EXTENDED_RELEASE_TABLET | Freq: Three times a day (TID) | ORAL | Status: DC
  Administered 2012-02-04: 40 meq via ORAL
  Filled 2012-02-04: qty 2

## 2012-02-04 MED ORDER — INFLUENZA VIRUS VACC SPLIT PF IM SUSP
0.5000 mL | INTRAMUSCULAR | Status: AC
  Administered 2012-02-05: 0.5 mL via INTRAMUSCULAR
  Filled 2012-02-04: qty 0.5

## 2012-02-04 MED ORDER — INSULIN ASPART 100 UNIT/ML ~~LOC~~ SOLN
0.0000 [IU] | Freq: Three times a day (TID) | SUBCUTANEOUS | Status: DC
  Administered 2012-02-05: 5 [IU] via SUBCUTANEOUS
  Administered 2012-02-05: 3 [IU] via SUBCUTANEOUS
  Administered 2012-02-05: 5 [IU] via SUBCUTANEOUS
  Administered 2012-02-06 (×2): 3 [IU] via SUBCUTANEOUS
  Administered 2012-02-06: 5 [IU] via SUBCUTANEOUS
  Administered 2012-02-07: 1 [IU] via SUBCUTANEOUS
  Administered 2012-02-07: 5 [IU] via SUBCUTANEOUS

## 2012-02-04 MED ORDER — HYDROMORPHONE HCL PF 1 MG/ML IJ SOLN
1.0000 mg | INTRAMUSCULAR | Status: DC | PRN
  Administered 2012-02-04 (×2): 1 mg via INTRAVENOUS
  Administered 2012-02-05 (×2): 2 mg via INTRAVENOUS
  Administered 2012-02-05: 1 mg via INTRAVENOUS
  Administered 2012-02-05 – 2012-02-06 (×7): 2 mg via INTRAVENOUS
  Administered 2012-02-06: 1 mg via INTRAVENOUS
  Filled 2012-02-04: qty 2
  Filled 2012-02-04: qty 1
  Filled 2012-02-04 (×2): qty 2
  Filled 2012-02-04: qty 1
  Filled 2012-02-04 (×2): qty 2
  Filled 2012-02-04 (×2): qty 1
  Filled 2012-02-04 (×4): qty 2

## 2012-02-04 MED ORDER — POLYETHYLENE GLYCOL 3350 17 G PO PACK
17.0000 g | PACK | Freq: Every day | ORAL | Status: DC | PRN
  Filled 2012-02-04: qty 1

## 2012-02-04 MED ORDER — SODIUM CHLORIDE 0.9 % IV SOLN
INTRAVENOUS | Status: DC
  Administered 2012-02-04: 7.7 [IU]/h via INTRAVENOUS
  Administered 2012-02-04: 5.9 [IU]/h via INTRAVENOUS
  Filled 2012-02-04: qty 1

## 2012-02-04 MED ORDER — POTASSIUM CHLORIDE 10 MEQ/100ML IV SOLN: 10.0000 meq | INTRAVENOUS | Status: DC

## 2012-02-04 MED ORDER — HYDROMORPHONE HCL PF 1 MG/ML IJ SOLN
0.5000 mg | INTRAMUSCULAR | Status: DC | PRN
  Administered 2012-02-04 (×2): 0.5 mg via INTRAVENOUS
  Filled 2012-02-04 (×2): qty 1

## 2012-02-04 NOTE — Progress Notes (Signed)
Inpatient Diabetes Program Recommendations  AACE/ADA: New Consensus Statement on Inpatient Glycemic Control (2013)  Target Ranges:  Prepandial:   less than 140 mg/dL      Peak postprandial:   less than 180 mg/dL (1-2 hours)      Critically ill patients:  140 - 180 mg/dL   Reason for Visit: [name] admitted with DKA.  She has had Type 1 diabetes since age 31.  She has an Omnipod insulin pump that used to be managed by Dr. Sharl Ma.  However she was dismissed from his practice due to missed appointments.  She continues on insulin drip due to DKA.   Her insulin pump is currently off and her settings are: Basal rate=0.3 units/hour Total basal=7.2 units/24 hours Her target CBG=100 mg/dL  Correction factor= 1 unit drops CBG approximately 65 mg/dL Insulin to CHO ratio:  1 unit/6 grams CHO  I looked back at her CBG trends, and for the past month her average was mostly greater than 500 mg/dL.  I suspect that her basal rate is not high enough to meet her basal insulin needs.  She needs an endocrinologist to look at insulin pump setting and make adjustments based on very high CBG's.  I reviewed with her the physiology of Type 1 diabetes and the absolute insulin deficiency.  Described the need for insulin to take glucose into the cells for energy. She reports feeling sluggish all the time. Attempted to motivate [name] to improve glycemic control to prevent long term and acute complications.  Called Dr. Delsa Bern (new endocrinologist in town) office and will give phone number to [name].   Called Dr. Sunnie Nielsen to discuss.  I do not recommend her going back on the insulin pump until she is seen by an endocrinologist.  When [name] is ready to transition off insulin pump,may consider (.8 units/kg/day) Lantus 20 units daily, Novolog 5 units tid with meals (Hold if [name] eats less than 50%), and Novolog sensitive correction.      [name] spoke about the high amount of stress and anxiety she constantly feels.  She  lost a baby at 76 1/2 months gestation a year and a half ago.  She says that stress makes her CBG's go up.  Will need lots of follow-up and support after hospitalization to assist her with proper care of herself.  [name] states "no one has ever explained things the way you have".  She was very appreciative and seems motivated to improve her diabetes management.  Placed referral for outpatient follow-up with CDE.

## 2012-02-04 NOTE — ED Notes (Signed)
Notified RN, Bobby CBG 252.

## 2012-02-04 NOTE — Progress Notes (Signed)
Chaplain responded to a request for a Spiritual Care Consult.  Patient requested help with managing her stress and also talked about losing her son and how that has impacted her and her family.  Patient stated that she prays a lot.  Chaplain encouraged patient to think about what she believes about God and explore how what she believes impacts her stress level...particularly as it relates to feelings of not having control over what happens to her children and to her.  Visit was interrupted when patient started feeling sick.  Patient apologized for ending the visit and requested a follow up when she feels better.

## 2012-02-04 NOTE — ED Notes (Signed)
Pt is very difficult IV start unable to obtain site with attempts by ED RN , IV team member and phlebotomy EDP in with ultrasound machine attempting iv start.

## 2012-02-04 NOTE — Progress Notes (Signed)
Pt taken off of glucose stabilizer. Anion Gap. 8

## 2012-02-04 NOTE — Progress Notes (Signed)
  Patient:  [name], [name]   Account Number:  [id number]  Date Initiated:  02/04/2012  Documentation initiated by:  Jasson Siegmann  Subjective/Objective Assessment:   pt with known hx of dka, admitted with abnormal labs and requiring iv insulin     Action/Plan:   lives at home   Anticipated DC Date:  02/07/2012   Anticipated DC Plan:  HOME/SELF CARE  In-house referral  NA      DC Planning Services  NA      [facility] Choice  NA   Choice offered to / List presented to:  NA   DME arranged  NA      DME agency  NA     HH arranged  NA      HH agency  NA   Status of service:  In process, will continue to follow Medicare Important Message given?  NA - LOS <3 / Initial given by admissions (If response is "NO", the following Medicare IM given date fields will be blank) Date Medicare IM given:   Date Additional Medicare IM given:    Discharge Disposition:    Per UR Regulation:  Reviewed for med. necessity/level of care/duration of stay  If discussed at Long Length of Stay Meetings, dates discussed:    Comments:  [id number]/Mandy Dejaynes Earlene Plater, RN, BSN, CCM: CHART REVIEWED AND UPDATED.  Next chart review due on [id number]. NO DISCHARGE NEEDS PRESENT AT THIS TIME. CASE MANAGEMENT [phone]

## 2012-02-04 NOTE — Progress Notes (Addendum)
[facility] PROGRESS NOTE  [name]:[id number] DOB: 05-Jul-1980 DOA: 02/03/2012 PCP: [name], MD  Assessment/Plan: 1-DKA, type 1: most likely secondary to her urinary tract infection. Continue with insulin Gtt, IV fluids, KCl supplement. Bicarb increasing.  B-met every 4 hours. CBG every 2 hours.   2-UTI/Pyelonephritis: Patient presents with nausea, vomiting, UA with Too numerous to count WBC.  Continue with Ceftriaxone day 2. Check renal US, CVA tenderness. Will order GC, Chlamydia, HIV.  3-Acute renal failure: Pre renal, secondary to decrease volume. Improved with IV fluids.  4-Nausea and vomiting: Probably secondary to DKA, Pyelonephritis. Resolved.  5-Hyponatremia: Pseudohyponatremia, and secondary to dehydration. Improving.  6-Metabolic acidosis: Likely to DKA, but in setting of infection will check Lactic acid. Improving. 7-Abdominal Pain/ Flank pain: Likely secondary to UTI, Pyelo. If no improvement will consider CT abdomen.  8-Hypokalemia: 3 runs K-CL. Received 1 dose 40 meq at 8 am.  Code Status: Full Code Family Communication: Care discussed with patient.  Disposition Plan: Remain ICU.    Consultants:  None  Procedures:  None.  Antibiotics:  Ceftriaxone 12-12  HPI/Subjective: No nausea or vomiting today. Supra pubic pain controlled with pain Medications. No BM in last 2 days. Relates dysuria.  Also relates flank pain.   Objective: Filed Vitals:   02/04/12 0432 02/04/12 0653 02/04/12 0655 02/04/12 0656  BP: 120/67 100/60 94/62 111/70  Pulse: 101 108 110 112  Temp:    100.6 F (38.1 C)  TempSrc:    Oral  Resp: 22 16 10 16   Height:      Weight:      SpO2: 97% 99% 99% 100%    Intake/Output Summary (Last 24 hours) at 02/04/12 0824 Last data filed at 02/04/12 0700  Gross per 24 hour  Intake 3932.5 ml  Output   1500 ml  Net 2432.5 ml   Filed Weights   02/04/12 0325  Weight: 49.6 kg (109 lb 5.6 oz)     Exam:   General:  No distress  Cardiovascular: S 1, S 2 RRR  Respiratory:  CTA  Abdomen: Bs present, soft, Nor rigidity, no guarding, bilateral lower quadrant tenderness. CVA tenderness bl.  Data Reviewed: Basic Metabolic Panel:  Lab 02/04/12 8119 02/03/12 2348 02/03/12 2330  NA 131* 128* 126*  K 2.9* 3.5 3.4*  CL 99 98 83*  CO2 13* -- 9*  GLUCOSE 154* 376* 393*  BUN 12 18 19   CREATININE 0.74 1.00 1.00  CALCIUM 8.3* -- 10.1  MG -- -- --  PHOS -- -- --   Liver Function Tests:  Lab 02/03/12 2330  AST 12  ALT 10  ALKPHOS 115  BILITOT 0.3  PROT 9.4*  ALBUMIN 3.9   No results found for this basename: LIPASE:5,AMYLASE:5 in the last 168 hours No results found for this basename: AMMONIA:5 in the last 168 hours CBC:  Lab 02/04/12 0525 02/03/12 2348 02/03/12 2330  WBC 6.6 -- 8.4  NEUTROABS -- -- 7.3  HGB 11.3* 15.0 14.0  HCT 32.7* 44.0 39.3  MCV 87.2 -- 87.7  PLT 175 -- 218   Cardiac Enzymes: No results found for this basename: CKTOTAL:5,CKMB:5,CKMBINDEX:5,TROPONINI:5 in the last 168 hours BNP (last 3 results) No results found for this basename: PROBNP:3 in the last 8760 hours CBG:  Lab 02/04/12 0749 02/04/12 0644 02/04/12 0539 02/04/12 0431 02/04/12 0210  GLUCAP 231* 190* 146* 139* 252*    Recent Results (from the past 240 hour(s))  MRSA PCR SCREENING     Status: Normal  Collection Time   02/04/12  3:25 AM      Component Value Range Status Comment   MRSA by PCR NEGATIVE  NEGATIVE Final      Studies: Dg Chest 2 View  02/03/2012  *RADIOLOGY REPORT*  Clinical Data: Bilateral flank pain.  Back pain.  Short of breath.  CHEST - 2 VIEW  Comparison: 10/18/2011.  Findings: Bilateral nipple studs are present.  Cardiopericardial silhouette within normal limits. Mediastinal contours normal. Trachea midline.  No airspace disease or effusion.  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  IMPRESSION: No active cardiopulmonary disease.   Original  Report Authenticated By: Andreas Newport, M.D.     Scheduled Meds:   . sodium chloride  1,000 mL Intravenous Once  . cefTRIAXone (ROCEPHIN)  IV  1 g Intravenous Q24H  . heparin  5,000 Units Subcutaneous Q8H  . influenza  inactive virus vaccine  0.5 mL Intramuscular Tomorrow-1000  . insulin regular  0-10 Units Intravenous TID WC  . potassium chloride  10 mEq Intravenous Q1 Hr x 3  . potassium chloride  40 mEq Oral Once  . sodium chloride  1,000 mL Intravenous Once  . sodium chloride  3 mL Intravenous Q12H   Continuous Infusions:   . dextrose 5 % and 0.45% NaCl 100 mL/hr at 02/04/12 0342  . insulin (NOVOLIN-R) infusion 1.3 Units/hr (02/04/12 0600)    Principal Problem:  *DKA, type 1 Active Problems:  Nausea and vomiting  Acute renal failure  Hyponatremia  UTI (lower urinary tract infection)    Time spent: 35 minutes    REGALADO,BELKYS  [facility] Pager [phone]. If 8PM-8AM, please contact night-coverage at [email or web address], password [facility] 02/04/2012, 8:24 AM  LOS: 1 day

## 2012-02-04 NOTE — Progress Notes (Signed)
INITIAL ADULT NUTRITION ASSESSMENT Date: 02/04/2012   Time: 11:19 AM Reason for Assessment: MST  INTERVENTION: 1.  Brief education; provided.  Discussed needs and ways to manage glucose.  Discussed sources of CHO and good choices for hypo vs hyperglycemia.  Encouraged consistent CHO intake.   DOCUMENTATION CODES Per approved criteria  -Not Applicable    ASSESSMENT: Female 31 y.o.  Dx: DKA, type 1  Hx:  Past Medical History  Diagnosis Date  . Diabetes mellitus   . Thyroid disease   . Back pain   . Panic attack   . PONV (postoperative nausea and vomiting)   . Anxiety   . GERD (gastroesophageal reflux disease)    Past Surgical History  Procedure Date  . Laparoscopy     age 89  . Back surgery     age 55    Related Meds:  Scheduled Meds:   . sodium chloride  1,000 mL Intravenous Once  . cefTRIAXone (ROCEPHIN)  IV  1 g Intravenous Q24H  . heparin  5,000 Units Subcutaneous Q8H  . influenza  inactive virus vaccine  0.5 mL Intramuscular Tomorrow-1000  . insulin regular  0-10 Units Intravenous TID WC  . potassium chloride  10 mEq Intravenous Q1 Hr x 3  . senna-docusate  1 tablet Oral BID  . sodium chloride  1,000 mL Intravenous Once  . sodium chloride  3 mL Intravenous Q12H   Continuous Infusions:   . dextrose 5 % and 0.45% NaCl 75 mL/hr at 02/04/12 1035  . insulin (NOVOLIN-R) infusion 5 Units/hr (02/04/12 1016)   PRN Meds:.dextrose, HYDROmorphone (DILAUDID) injection, ondansetron (ZOFRAN) IV, ondansetron, polyethylene glycol   Ht: 5\' 4"  (162.6 cm)  Wt: 109 lb 5.6 oz (49.6 kg)  Ideal Wt: 120 lbs % Ideal Wt: 90%  Usual Wt: 110 lbs % Usual Wt: 100%  Body mass index is 18.77 kg/(m^2).  Food/Nutrition Related Hx: eating well prior to N/V  Labs:  CMP     Component Value Date/Time   NA 127* 02/04/2012 0940   K 3.2* 02/04/2012 0940   CL 95* 02/04/2012 0940   CO2 17* 02/04/2012 0940   GLUCOSE 241* 02/04/2012 0940   BUN 9 02/04/2012 0940   CREATININE  0.78 02/04/2012 0940   CREATININE 0.37* 02/05/2007 2130   CALCIUM 8.5 02/04/2012 0940   PROT 9.4* 02/03/2012 2330   ALBUMIN 3.9 02/03/2012 2330   AST 12 02/03/2012 2330   ALT 10 02/03/2012 2330   ALKPHOS 115 02/03/2012 2330   BILITOT 0.3 02/03/2012 2330   GFRNONAA >90 02/04/2012 0940   GFRAA >90 02/04/2012 0940    CBC    Component Value Date/Time   WBC 6.6 02/04/2012 0525   WBC 8.1 02/27/2007 0848   RBC 3.75* 02/04/2012 0525   RBC 3.94 02/27/2007 0848   HGB 11.3* 02/04/2012 0525   HGB 12.7 02/27/2007 0848   HCT 32.7* 02/04/2012 0525   HCT 36.2 02/27/2007 0848   PLT 175 02/04/2012 0525   PLT 236 02/27/2007 0848   MCV 87.2 02/04/2012 0525   MCV 91.9 02/27/2007 0848   MCH 30.1 02/04/2012 0525   MCH 32.2 02/27/2007 0848   MCHC 34.6 02/04/2012 0525   MCHC 35.0 02/27/2007 0848   RDW 12.6 02/04/2012 0525   RDW 16.8* 02/27/2007 0848   LYMPHSABS 0.8 02/03/2012 2330   LYMPHSABS 1.9 02/27/2007 0848   MONOABS 0.4 02/03/2012 2330   MONOABS 0.4 02/27/2007 0848   EOSABS 0.0 02/03/2012 2330   EOSABS 0.0 02/27/2007 0848  BASOSABS 0.0 02/03/2012 2330   BASOSABS 0.0 02/27/2007 0848    Intake: 100% Output:   Intake/Output Summary (Last 24 hours) at 02/04/12 1124 Last data filed at 02/04/12 1000  Gross per 24 hour  Intake 4918.5 ml  Output   1500 ml  Net 3418.5 ml   Last BM (12/11)  Diet Order: Carb Control  Supplements/Tube Feeding:  None at this time  IVF:    dextrose 5 % and 0.45% NaCl Last Rate: 75 mL/hr at 02/04/12 1035  insulin (NOVOLIN-R) infusion Last Rate: 5 Units/hr (02/04/12 1016)    Estimated Nutritional Needs:   Kcal: 1500-1800 Protein: 50-60g Fluid: ~1.8 L/day  Pt admitted with DKA.    RD consulted for nutrition education regarding diabetes.   Lab Results  Component Value Date   HGBA1C 10.2* 10/20/2011    RD discussed glucose management with pt with Type 1 DM.  Pt states she is not good with math and no matter how many times bolusing insulin has been taught to her, she  has recently seen much better glucose control with omnipod.  Discussed different food groups and their effects on blood sugar, emphasizing quickly absorbed CHOs versus prolonged absorption of fiber, fat, and protein.  Discussed importance of controlled and consistent carbohydrate intake throughout the day as pt states she tends to eat snacks all day long.  Provided examples of ways to balance meals/snacks and encouraged intake of high-fiber, whole grain complex carbohydrates. Teach back method used.  Current diet order is CHO Modified Med, patient is consuming approximately 100% of meals at this time.   NUTRITION DIAGNOSIS: -Nutrition-related knowledge deficit.  Status: Ongoing  RELATED TO: diabetes management   AS EVIDENCE BY: pt with questions.  MONITORING/EVALUATION(Goals): 1.  Food/Beverage; pt to continue consuming >75% of meals 2.  Blood glucose; adequate control, 80-150 mg/dL  EDUCATION NEEDS: -Education needs addressed with pt re: DKA   Loyce Dys, MS RD LDN Clinical Inpatient Dietitian Pager: [phone] Weekend/After hours pager: [phone]

## 2012-02-04 NOTE — ED Notes (Signed)
Attempted to call report to [employer] who is unavailable at this time

## 2012-02-04 NOTE — ED Notes (Addendum)
Notified RN,Bobby CBG 379.

## 2012-02-04 NOTE — H&P (Signed)
[facility] History and Physical  RYELEIGH SANTORE AVW:[id number] DOB: [birth year] DOA: 02/03/2012  Referring physician: Dr. Lynelle Doctor PCP: [name], MD  Specialists: none  Chief Complaint: abdominal pain  HPI: MARVIN MAENZA is a 31 y.o. female  Diabetes type 2 with a last hemoglobin A1c of 10.2 on 10/12/2011 discharge from the hospital on 10/22/2011 for DKA and altered mental status comes in for abdominal pain 2 days prior to admission progressively getting worse, and burning when she urinates. She also relates that her urine has changed color to it very dark orange like. No back pain no fevers at home. She's also been complaining of severe nausea and vomiting progressively getting worse with no improvement. No vaginal discharge, no rashes ulcerations.  Review of Systems: The patient denies anorexia, fever, weight loss,, vision loss, decreased hearing, hoarseness, chest pain, syncope, dyspnea on exertion, peripheral edema, balance deficits, hemoptysis, abdominal pain, melena, hematochezia, severe indigestion/heartburn, hematuria, incontinence, genital sores, muscle weakness, suspicious skin lesions, transient blindness, difficulty walking, depression, unusual weight change, abnormal bleeding, enlarged lymph nodes, angioedema, and breast masses.    Past Medical History  Diagnosis Date  . Diabetes mellitus   . Thyroid disease   . Back pain   . Panic attack   . PONV (postoperative nausea and vomiting)   . Anxiety   . GERD (gastroesophageal reflux disease)    Past Surgical History  Procedure Date  . Laparoscopy     age 32  . Back surgery     age 84   Social History:  reports that she quit smoking about 11 years ago. She has quit using smokeless tobacco. She reports that she drinks alcohol. She reports that she does not use illicit drugs. Visit home with his sons can perform all her ADLs  Allergies  Allergen Reactions  . Scallops (Shellfish Allergy) Anaphylaxis  .  Morphine And Related Itching    Family History  Problem Relation Age of Onset  . Other Mother   . Other Father     Prior to Admission medications   Medication Sig Start Date End Date Taking? Authorizing Provider  ibuprofen (ADVIL,MOTRIN) 200 MG tablet Take 400 mg by mouth every 6 (six) hours as needed. For pain   Yes Historical Provider, MD  Insulin Human (INSULIN PUMP) 100 unit/ml SOLN Inject 1 each into the skin continuous. Novolog insulin   Yes Historical Provider, MD  meperidine (DEMEROL) 50 MG tablet Take 25 mg by mouth every 4 (four) hours as needed. For pain   Yes Historical Provider, MD  vitamin B-12 (CYANOCOBALAMIN) 100 MCG tablet Take 100 mcg by mouth daily.    Yes Historical Provider, MD  vitamin C (ASCORBIC ACID) 500 MG tablet Take 500 mg by mouth daily.   Yes Historical Provider, MD   Physical Exam: Filed Vitals:   02/03/12 2158  BP: 109/70  Pulse: 94  Temp: 97.4 F (36.3 C)  TempSrc: Oral  Resp: 16  SpO2: 100%     General:  Alert and oriented x3 thin female labile motion and  Eyes: Anicteric  ENT: Dry mucous membranes  Neck: No JVD  Cardiovascular: Mild tachycardia with a regular rate and rhythm positive S1 and S2  Respiratory: Good air movement and clear to auscultation  Abdomen: Positive bowel sounds, mild suprapubic tenderness no CVA tenderness nondistended and soft  Skin: No rashes ulcerations  Musculoskeletal: Intact  Psychiatric: Appropriate  Neurologic: Nonfocal  Labs on Admission:  Basic Metabolic Panel:  Lab 02/03/12 8295 02/03/12 2330  NA 128* 126*  K 3.5 3.4*  CL 98 83*  CO2 -- 9*  GLUCOSE 376* 393*  BUN 18 19  CREATININE 1.00 1.00  CALCIUM -- 10.1  MG -- --  PHOS -- --   Liver Function Tests:  Lab 02/03/12 2330  AST 12  ALT 10  ALKPHOS 115  BILITOT 0.3  PROT 9.4*  ALBUMIN 3.9   No results found for this basename: LIPASE:5,AMYLASE:5 in the last 168 hours No results found for this basename: AMMONIA:5 in the last 168  hours CBC:  Lab 02/03/12 2348 02/03/12 2330  WBC -- 8.4  NEUTROABS -- 7.3  HGB 15.0 14.0  HCT 44.0 39.3  MCV -- 87.7  PLT -- 218   Cardiac Enzymes: No results found for this basename: CKTOTAL:5,CKMB:5,CKMBINDEX:5,TROPONINI:5 in the last 168 hours  BNP (last 3 results) No results found for this basename: PROBNP:3 in the last 8760 hours CBG: No results found for this basename: GLUCAP:5 in the last 168 hours  Radiological Exams on Admission: Dg Chest 2 View  02/03/2012  *RADIOLOGY REPORT*  Clinical Data: Bilateral flank pain.  Back pain.  Short of breath.  CHEST - 2 VIEW  Comparison: 10/18/2011.  Findings: Bilateral nipple studs are present.  Cardiopericardial silhouette within normal limits. Mediastinal contours normal. Trachea midline.  No airspace disease or effusion.  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  IMPRESSION: No active cardiopulmonary disease.   Original Report Authenticated By: Andreas Newport, M.D.     EKG: None  Assessment/Plan Principal Problem: DKA, type 1: - She has been compliant with her insulin, this most likely secondary to her urinary tract infection. We'll give her another liter of normal saline. Her bicarbonate is 9 she has an anion gap of greater than 30, and a blood glucose 376. Admit to the step down unit.  I will continue insulin drip and check CBGs every 2 hours a b-met q4hrs  -I will check B-met every 4 hours, her potassium is 3.5. I will continue IV runs KCL, and oral potassium and try to keep her  potassium is greater than 4. Her bicarbonate was 9 so as her acidosis corrected  She might become hypokalemic.  UTI (lower urinary tract infection): - Agree with Rocephin, UA that showed too numerous to count white blood cells and many bacteria. Will get a blood culture and urine culture. And we'll adjust it according to sensitivities. Is mild suprapubic tenderness and complaining of dysuria with this but compatible with urinary  tract infection. Negative CVA tenderness.   Acute renal failure: - This most likely secondary to decreased intravascular volume the ED has a ready given her 2 L of normal saline. We'll go ahead and give her an additional liter of normal saline and continue her 125. I have asked him as to place a Foley and continue strict I.'s and O.'s.  Nausea and vomiting: - Most likely secondary to her DKA and continue Zofran when necessary.  Hyponatremia: - Is most likely secondary to decreased intravascular volume and a component of pseudohyponatremia secondary to her elevated blood glucose. Will continue aggressive fluid resuscitation and check a basic metabolic panel in the morning.   Code Status: Full code Family Communication: none  Disposition Plan: home 3-4 days (indicate anticipated LOS)  Time spent: 60 minutes  Marinda Elk [facility] Pager [phone]  If 7PM-7AM, please contact night-coverage [email or web address] Password [facility] 02/04/2012, 12:33 AM

## 2012-02-04 NOTE — ED Notes (Signed)
Return call from [employer] report given bed is ready

## 2012-02-05 LAB — GLUCOSE, CAPILLARY
Glucose-Capillary: 156 mg/dL — ABNORMAL HIGH (ref 70–99)
Glucose-Capillary: 284 mg/dL — ABNORMAL HIGH (ref 70–99)
Glucose-Capillary: 367 mg/dL — ABNORMAL HIGH (ref 70–99)
Glucose-Capillary: 233 mg/dL — ABNORMAL HIGH (ref 70–99)

## 2012-02-05 LAB — URINE CULTURE

## 2012-02-05 LAB — CBC
MCH: 29.9 pg (ref 26.0–34.0)
MCHC: 34.5 g/dL (ref 30.0–36.0)
MCV: 86.7 fL (ref 78.0–100.0)
Platelets: 136 10*3/uL — ABNORMAL LOW (ref 150–400)
RDW: 12.6 % (ref 11.5–15.5)

## 2012-02-05 LAB — BASIC METABOLIC PANEL
BUN: 5 mg/dL — ABNORMAL LOW (ref 6–23)
Chloride: 98 mEq/L (ref 96–112)
GFR calc Af Amer: >90 mL/min (ref >90)
GFR calc non Af Amer: >90 mL/min (ref >90)
Potassium: 3.7 mEq/L (ref 3.5–5.1)
Sodium: 131 mEq/L — ABNORMAL LOW (ref 135–145)

## 2012-02-05 LAB — GC/CHLAMYDIA PROBE AMP
CT Probe RNA: NEGATIVE
GC Probe RNA: NEGATIVE

## 2012-02-05 MED ORDER — HYDROCODONE-ACETAMINOPHEN 5-325 MG PO TABS
1.0000 | ORAL_TABLET | Freq: Four times a day (QID) | ORAL | Status: DC | PRN
  Administered 2012-02-05 – 2012-02-07 (×7): 1 via ORAL
  Filled 2012-02-05 (×7): qty 1

## 2012-02-05 MED ORDER — INSULIN GLARGINE 100 UNIT/ML ~~LOC~~ SOLN
23.0000 [IU] | Freq: Every day | SUBCUTANEOUS | Status: DC
  Administered 2012-02-05: 23 [IU] via SUBCUTANEOUS

## 2012-02-05 MED ORDER — ACETAMINOPHEN 325 MG PO TABS
650.0000 mg | ORAL_TABLET | Freq: Four times a day (QID) | ORAL | Status: DC | PRN
  Administered 2012-02-05: 650 mg via ORAL
  Filled 2012-02-05: qty 2

## 2012-02-05 MED ORDER — SODIUM CHLORIDE 0.9 % IV SOLN
INTRAVENOUS | Status: DC
  Administered 2012-02-05: 125 mL/h via INTRAVENOUS
  Administered 2012-02-05: via INTRAVENOUS
  Administered 2012-02-05: 125 mL/h via INTRAVENOUS
  Administered 2012-02-05 – 2012-02-06 (×3): via INTRAVENOUS

## 2012-02-05 NOTE — Progress Notes (Signed)
[facility] PROGRESS NOTE  [name]:[id number] DOB: Mar 22, 1980 DOA: 02/03/2012 PCP: [name], MD  Assessment/Plan: 1-DKA, type 1: most likely secondary to  urinary tract infection and insulin Pump setting were not adequate.  Anion Gap close, Bicarb increase to 24. She was transition to Lantus. Meals coverage, SSI.  She will likely be discharge on Lantus. She will follow up with  Endocrinologist.  2-UTI/Pyelonephritis:  Patient presents with nausea, vomiting, UA with Too numerous to count WBC.  Continue with Ceftriaxone day 3.  renal US:  Increased cortical echogenicity bilaterally compatible with medical  renal parenchymal disease. Echogenic material is present in the right renal pelvis as well as debris in the bladder. This is consistent with a history of  pyuria. Unfortunate Urine culture show no growth.  Blood culture pending. GC, Chlamydia pending, HIV non reactive.  3-Acute renal failure: Pre renal, secondary to decrease volume. Improved with IV fluids.  4-Nausea and vomiting: Probably secondary to DKA, Pyelonephritis. Resolved.  5-Hyponatremia: Pseudohyponatremia, and secondary to dehydration. Improving.  6-Metabolic acidosis: Likely to DKA, Lactic acid at 2.2. Resolved. 7-Abdominal Pain/ Flank pain: Likely secondary to UTI, Pyelo. If no improvement will consider CT abdomen.  8-Hypokalemia: resolved with potassium supplement.   Code Status: Full Code  Family Communication: Care discussed with patient.  Disposition Plan: Transfer to regular bed.   Consultants:  None Procedures:  None. Antibiotics:  Ceftriaxone 12-12   HPI/Subjective: Feeling better. She relates supra pubic pain some what improved. Feels still needs pain medications.   Objective: Filed Vitals:   02/05/12 0331 02/05/12 0400 02/05/12 0500 02/05/12 0547  BP: 106/62   91/58  Pulse: 102   93  Temp:  98.5 F (36.9 C)    TempSrc:  Oral    Resp: 12   12  Height:      Weight:    54.2 kg (119 lb 7.8 oz)   SpO2: 98%   97%    Intake/Output Summary (Last 24 hours) at 02/05/12 0758 Last data filed at 02/05/12 0600  Gross per 24 hour  Intake 7348.59 ml  Output   4250 ml  Net 3098.59 ml   Filed Weights   02/04/12 0325 02/05/12 0500  Weight: 49.6 kg (109 lb 5.6 oz) 54.2 kg (119 lb 7.8 oz)    Exam:   General:  No distress.  Cardiovascular: S 1, S 2 RRR  Respiratory: CTA  Abdomen: Bs present, soft, mild suprapubic tenderness, No rigidity.   Data Reviewed: Basic Metabolic Panel:  Lab 02/05/12 1478 02/04/12 1754 02/04/12 1330 02/04/12 0940 02/04/12 0525  NA 131* 130* 133* 127* 131*  K 3.7 3.7 3.4* 3.2* 2.9*  CL 98 98 102 95* 99  CO2 24 24 22  17* 13*  GLUCOSE 297* 155* 141* 241* 154*  BUN 5* 7 9 9 12   CREATININE 0.53 0.64 0.72 0.78 0.74  CALCIUM 8.1* 8.2* 8.2* 8.5 8.3*  MG -- -- -- -- --  PHOS -- -- -- -- --   Liver Function Tests:  Lab 02/03/12 2330  AST 12  ALT 10  ALKPHOS 115  BILITOT 0.3  PROT 9.4*  ALBUMIN 3.9   No results found for this basename: LIPASE:5,AMYLASE:5 in the last 168 hours No results found for this basename: AMMONIA:5 in the last 168 hours CBC:  Lab 02/04/12 0525 02/03/12 2348 02/03/12 2330  WBC 6.6 -- 8.4  NEUTROABS -- -- 7.3  HGB 11.3* 15.0 14.0  HCT 32.7* 44.0 39.3  MCV 87.2 -- 87.7  PLT  175 -- 218   Cardiac Enzymes: No results found for this basename: CKTOTAL:5,CKMB:5,CKMBINDEX:5,TROPONINI:5 in the last 168 hours BNP (last 3 results) No results found for this basename: PROBNP:3 in the last 8760 hours CBG:  Lab 02/04/12 2117 02/04/12 2008 02/04/12 1812 02/04/12 1653 02/04/12 1550  GLUCAP 151* 179* 178* 173* 186*    Recent Results (from the past 240 hour(s))  URINE CULTURE     Status: Normal   Collection Time   02/03/12 10:42 PM      Component Value Range Status Comment   Specimen Description URINE, CLEAN CATCH   Final    Special Requests NONE   Final    Culture  Setup Time 02/04/2012 06:46   Final     Colony Count NO GROWTH   Final    Culture NO GROWTH   Final    Report Status 02/05/2012 FINAL   Final   MRSA PCR SCREENING     Status: Normal   Collection Time   02/04/12  3:25 AM      Component Value Range Status Comment   MRSA by PCR NEGATIVE  NEGATIVE Final      Studies: Dg Chest 2 View  02/03/2012  *RADIOLOGY REPORT*  Clinical Data: Bilateral flank pain.  Back pain.  Short of breath.  CHEST - 2 VIEW  Comparison: 10/18/2011.  Findings: Bilateral nipple studs are present.  Cardiopericardial silhouette within normal limits. Mediastinal contours normal. Trachea midline.  No airspace disease or effusion.  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  IMPRESSION: No active cardiopulmonary disease.   Original Report Authenticated By: Andreas Newport, M.D.    US Renal  02/04/2012  *RADIOLOGY REPORT*  Clinical Data: Pyuria  RENAL/URINARY TRACT ULTRASOUND COMPLETE  Comparison:  05/29/2007  Findings: The spleen is borderline prominent with a greatest diameter of 12.7 cm.  Right Kidney:  11.5 cm in length.  Cortex is slightly echogenic and heterogeneous. Isoechoic material is present within the renal pelvis consistent with the history of pyuria.  No solid mass.  No hydronephrosis.  Left Kidney:  10.7 cm in length.  Increased cortical echogenicity. No hydronephrosis or mass.  Bladder:  There is some debris within the bladder.  The wall of the bladder is slightly thickened.  IMPRESSION: Increased cortical echogenicity bilaterally compatible with medical renal parenchymal disease.  Echogenic material is present in the right renal pelvis as well as debris in the bladder.  This is consistent with a history of pyuria.  Correlate with urinalysis.   Original Report Authenticated By: Jolaine Click, M.D.     Scheduled Meds:   . cefTRIAXone (ROCEPHIN)  IV  1 g Intravenous Q24H  . heparin  5,000 Units Subcutaneous Q8H  . influenza  inactive virus vaccine  0.5 mL Intramuscular Tomorrow-1000  .  insulin aspart  0-9 Units Subcutaneous TID WC  . insulin aspart  3 Units Subcutaneous TID WC  . insulin glargine  23 Units Subcutaneous QHS  . insulin regular  0-10 Units Intravenous TID WC  . senna-docusate  1 tablet Oral BID   Continuous Infusions:   . sodium chloride 75 mL/hr at 02/05/12 0015    Principal Problem:  *DKA, type 1 Active Problems:  Nausea and vomiting  Acute renal failure  Hyponatremia  UTI (lower urinary tract infection)    Time spent: 30 minutes    REGALADO,BELKYS  [facility] Pager [phone]. If 8PM-8AM, please contact night-coverage at [email or web address], password [email or web address] 02/05/2012, 7:58 AM  LOS: 2 days

## 2012-02-06 ENCOUNTER — Inpatient Hospital Stay (HOSPITAL_COMMUNITY): Payer: Medicaid Other

## 2012-02-06 DIAGNOSIS — E1010 Type 1 diabetes mellitus with ketoacidosis without coma: Secondary | ICD-10-CM

## 2012-02-06 DIAGNOSIS — N12 Tubulo-interstitial nephritis, not specified as acute or chronic: Secondary | ICD-10-CM

## 2012-02-06 DIAGNOSIS — D649 Anemia, unspecified: Secondary | ICD-10-CM

## 2012-02-06 LAB — CBC
HCT: 27.2 % — ABNORMAL LOW (ref 36.0–46.0)
MCHC: 34.2 g/dL (ref 30.0–36.0)
Platelets: 144 10*3/uL — ABNORMAL LOW (ref 150–400)
RDW: 12.9 % (ref 11.5–15.5)

## 2012-02-06 LAB — BASIC METABOLIC PANEL
BUN: 8 mg/dL (ref 6–23)
GFR calc Af Amer: >90 mL/min (ref >90)
GFR calc non Af Amer: >90 mL/min (ref >90)
Potassium: 3.7 mEq/L (ref 3.5–5.1)

## 2012-02-06 LAB — GLUCOSE, CAPILLARY: Glucose-Capillary: 210 mg/dL — ABNORMAL HIGH (ref 70–99)

## 2012-02-06 MED ORDER — FERROUS SULFATE 325 (65 FE) MG PO TABS
325.0000 mg | ORAL_TABLET | Freq: Two times a day (BID) | ORAL | Status: DC
  Administered 2012-02-06 – 2012-02-07 (×2): 325 mg via ORAL
  Filled 2012-02-06 (×4): qty 1

## 2012-02-06 MED ORDER — INSULIN GLARGINE 100 UNIT/ML ~~LOC~~ SOLN
25.0000 [IU] | Freq: Every day | SUBCUTANEOUS | Status: DC
  Administered 2012-02-06: 25 [IU] via SUBCUTANEOUS

## 2012-02-06 MED ORDER — IOHEXOL 300 MG/ML  SOLN
100.0000 mL | Freq: Once | INTRAMUSCULAR | Status: AC | PRN
  Administered 2012-02-06: 100 mL via INTRAVENOUS

## 2012-02-06 MED ORDER — HYDROMORPHONE HCL PF 2 MG/ML IJ SOLN
1.0000 mg | INTRAMUSCULAR | Status: DC | PRN
  Administered 2012-02-06 – 2012-02-07 (×4): 1 mg via INTRAVENOUS
  Filled 2012-02-06 (×4): qty 1

## 2012-02-06 NOTE — Progress Notes (Signed)
[facility] PROGRESS NOTE  [name]:[id number] DOB: 10/16/1980 DOA: 02/03/2012 PCP: [name], MD  Assessment/Plan: 1-DKA, type 1: most likely secondary to urinary tract infection and insulin Pump setting were not adequate.  Anion Gap close, Bicarb increase to 24. She was transition to Lantus. Meals coverage, SSI.  She will likely be discharge on Lantus. She will follow up with Endocrinologist.  Increase lantus to 25 units.  2-UTI/Pyelonephritis:  Patient presents with nausea, vomiting, UA with Too numerous to count WBC.  Continue with Ceftriaxone day 4. renal US: Increased cortical echogenicity bilaterally compatible with medical renal parenchymal disease. Echogenic material is present in the right renal pelvis as well as debris in the bladder. This is consistent with a history of pyuria. Unfortunate Urine culture show no growth.  Blood culture no growth..  GC, Chlamydia negative, HIV non reactive.  Transition to Cipro at time of discharge.  3-Acute renal failure: Pre renal, secondary to decrease volume. Improved with IV fluids.  4-Nausea and vomiting: Probably secondary to DKA, Pyelonephritis. Resolved.  5-Hyponatremia: Pseudohyponatremia, and secondary to dehydration. Improving.  6-Metabolic acidosis: Likely to DKA, Lactic acid at 2.2. Resolved.  7-Abdominal Pain/ Flank pain: Likely secondary Pyelo. Will check CT abdomen, pain persist.  8-Hypokalemia: resolved with potassium supplement.   Code Status: Full Code  Family Communication: Care discussed with patient.  Disposition Plan: Discharge 12-16 if CT abdomen negative.    Consultants:  None Procedures:  None. Antibiotics:  Ceftriaxone 12-12  HPI/Subjective: Still with abdominal pain 8/10/. Eating ok.   Objective: Filed Vitals:   02/05/12 1019 02/05/12 2155 02/06/12 0454 02/06/12 1336  BP: 99/67 112/72 115/57 104/71  Pulse: 103 111 103 96  Temp: 98.7 F (37.1 C) 98.8 F (37.1 C) 98.3 F  (36.8 C) 98.4 F (36.9 C)  TempSrc: Oral Oral Oral Oral  Resp: 18 18 18 18   Height:      Weight:   57 kg (125 lb 10.6 oz)   SpO2: 100% 99% 100% 100%   No intake or output data in the 24 hours ending 02/06/12 1653 Filed Weights   02/04/12 0325 02/05/12 0500 02/06/12 0454  Weight: 49.6 kg (109 lb 5.6 oz) 54.2 kg (119 lb 7.8 oz) 57 kg (125 lb 10.6 oz)    Exam:   General:  No distress.  Cardiovascular: S 1, S 2 RRR  Respiratory: CTA  Abdomen: Bs present, soft, lower Quadrant tenderness.   Data Reviewed: Basic Metabolic Panel:  Lab 02/06/12 9562 02/05/12 0350 02/04/12 1754 02/04/12 1330 02/04/12 0940  NA 133* 131* 130* 133* 127*  K 3.7 3.7 3.7 3.4* 3.2*  CL 98 98 98 102 95*  CO2 25 24 24 22  17*  GLUCOSE 265* 297* 155* 141* 241*  BUN 8 5* 7 9 9   CREATININE 0.52 0.53 0.64 0.72 0.78  CALCIUM 8.4 8.1* 8.2* 8.2* 8.5  MG -- -- -- -- --  PHOS -- -- -- -- --   Liver Function Tests:  Lab 02/03/12 2330  AST 12  ALT 10  ALKPHOS 115  BILITOT 0.3  PROT 9.4*  ALBUMIN 3.9  CBC:  Lab 02/06/12 0518 02/05/12 0845 02/04/12 0525 02/03/12 2348 02/03/12 2330  WBC 3.4* 4.0 6.6 -- 8.4  NEUTROABS -- -- -- -- 7.3  HGB 9.3* 9.9* 11.3* 15.0 14.0  HCT 27.2* 28.7* 32.7* 44.0 39.3  MCV 88.6 86.7 87.2 -- 87.7  PLT 144* 136* 175 -- 218   Cardiac Enzymes: No results found for this basename: CKTOTAL:5,CKMB:5,CKMBINDEX:5,TROPONINI:5 in the  last 168 hours BNP (last 3 results) No results found for this basename: PROBNP:3 in the last 8760 hours CBG:  Lab 02/06/12 1640 02/06/12 1149 02/06/12 0739 02/05/12 2003 02/05/12 1701  GLUCAP 285* 210* 213* 233* 367*    Recent Results (from the past 240 hour(s))  URINE CULTURE     Status: Normal   Collection Time   02/03/12 10:42 PM      Component Value Range Status Comment   Specimen Description URINE, CLEAN CATCH   Final    Special Requests NONE   Final    Culture  Setup Time 02/04/2012 06:46   Final    Colony Count NO GROWTH   Final     Culture NO GROWTH   Final    Report Status 02/05/2012 FINAL   Final   MRSA PCR SCREENING     Status: Normal   Collection Time   02/04/12  3:25 AM      Component Value Range Status Comment   MRSA by PCR NEGATIVE  NEGATIVE Final   CULTURE, BLOOD (ROUTINE X 2)     Status: Normal (Preliminary result)   Collection Time   02/04/12  5:30 AM      Component Value Range Status Comment   Specimen Description BLOOD RIGHT ARM   Final    Special Requests BOTTLES DRAWN AEROBIC AND ANAEROBIC 5CC   Final    Culture  Setup Time 02/04/2012 08:25   Final    Culture     Final    Value:        BLOOD CULTURE RECEIVED NO GROWTH TO DATE CULTURE WILL BE HELD FOR 5 DAYS BEFORE ISSUING A FINAL NEGATIVE REPORT   Report Status PENDING   Incomplete   CULTURE, BLOOD (ROUTINE X 2)     Status: Normal (Preliminary result)   Collection Time   02/04/12  5:30 AM      Component Value Range Status Comment   Specimen Description BLOOD RIGHT ARM   Final    Special Requests BOTTLES DRAWN AEROBIC AND ANAEROBIC 5CC   Final    Culture  Setup Time 02/04/2012 08:25   Final    Culture     Final    Value:        BLOOD CULTURE RECEIVED NO GROWTH TO DATE CULTURE WILL BE HELD FOR 5 DAYS BEFORE ISSUING A FINAL NEGATIVE REPORT   Report Status PENDING   Incomplete   GC/CHLAMYDIA PROBE AMP     Status: Normal   Collection Time   02/04/12  3:10 PM      Component Value Range Status Comment   CT Probe RNA NEGATIVE  NEGATIVE Final    GC Probe RNA NEGATIVE  NEGATIVE Final      Studies: No results found.  Scheduled Meds:   . cefTRIAXone (ROCEPHIN)  IV  1 g Intravenous Q24H  . ferrous sulfate  325 mg Oral BID WC  . heparin  5,000 Units Subcutaneous Q8H  . insulin aspart  0-9 Units Subcutaneous TID WC  . insulin aspart  3 Units Subcutaneous TID WC  . insulin glargine  25 Units Subcutaneous QHS  . senna-docusate  1 tablet Oral BID   Continuous Infusions:   . sodium chloride 125 mL/hr at 02/06/12 1227    Principal Problem:  *DKA,  type 1 Active Problems:  Nausea and vomiting  Acute renal failure  Hyponatremia  UTI (lower urinary tract infection)  Pyelonephritis    Time spent: 25 minutes.    [name]  [facility] Pager [phone]. If 8PM-8AM, please contact night-coverage at [email or web address], password [facility] 02/06/2012, 4:53 PM  LOS: 3 days

## 2012-02-07 LAB — BASIC METABOLIC PANEL
GFR calc Af Amer: >90 mL/min (ref >90)
GFR calc non Af Amer: >90 mL/min (ref >90)
Glucose, Bld: 214 mg/dL — ABNORMAL HIGH (ref 70–99)
Potassium: 3.6 mEq/L (ref 3.5–5.1)
Sodium: 134 mEq/L — ABNORMAL LOW (ref 135–145)

## 2012-02-07 LAB — GLUCOSE, CAPILLARY: Glucose-Capillary: 149 mg/dL — ABNORMAL HIGH (ref 70–99)

## 2012-02-07 MED ORDER — CIPROFLOXACIN HCL 500 MG PO TABS
500.0000 mg | ORAL_TABLET | Freq: Two times a day (BID) | ORAL | Status: DC
  Administered 2012-02-07: 500 mg via ORAL
  Filled 2012-02-07 (×3): qty 1

## 2012-02-07 MED ORDER — POLYETHYLENE GLYCOL 3350 17 G PO PACK: 17.0000 g | PACK | Freq: Every day | ORAL | Status: DC | PRN

## 2012-02-07 MED ORDER — INSULIN GLARGINE 100 UNIT/ML ~~LOC~~ SOLN: 25.0000 [IU] | Freq: Every day | SUBCUTANEOUS | Status: DC

## 2012-02-07 MED ORDER — INSULIN ASPART 100 UNIT/ML ~~LOC~~ SOLN: 6.0000 [IU] | Freq: Three times a day (TID) | SUBCUTANEOUS | Status: DC

## 2012-02-07 MED ORDER — FERROUS SULFATE 325 (65 FE) MG PO TABS: 325.0000 mg | ORAL_TABLET | Freq: Two times a day (BID) | ORAL | Status: DC

## 2012-02-07 MED ORDER — HYDROCODONE-ACETAMINOPHEN 5-325 MG PO TABS: 1.0000 | ORAL_TABLET | Freq: Four times a day (QID) | ORAL | Status: DC | PRN

## 2012-02-07 MED ORDER — CIPROFLOXACIN HCL 500 MG PO TABS: 500.0000 mg | ORAL_TABLET | Freq: Two times a day (BID) | ORAL | Status: DC

## 2012-02-07 NOTE — Discharge Summary (Signed)
Physician Discharge Summary  [name] ZOX:[id number] DOB: 05-Aug-1980 DOA: 02/03/2012  PCP: [name], MD  Admit date: 02/03/2012 Discharge date: 02/07/2012  Time spent: 35 minutes  Recommendations for Outpatient Follow-up:  1. Needs B-met to follow renal function. Need to follow up with endocrinologist for further adjustment of insulin.   Discharge Diagnoses:   DKA, type 1  Acute renal failure  Hyponatremia  Pyelonephritis   Discharge Condition: Stable.  Diet recommendation: Carb modified diet.  Filed Weights   02/05/12 0500 02/06/12 0454 02/07/12 8119  Weight: 54.2 kg (119 lb 7.8 oz) 57 kg (125 lb 10.6 oz) 56.881 kg (125 lb 6.4 oz)    History of present illness:  [name] is a 31 y.o. female  Diabetes type 2 with a last hemoglobin A1c of 10.2 on 10/12/2011 discharge from the hospital on 10/22/2011 for DKA and altered mental status comes in for abdominal pain 2 days prior to admission progressively getting worse, and burning when she urinates. She also relates that her urine has changed color to it very dark orange like. No back pain no fevers at home. She's also been complaining of severe nausea and vomiting progressively getting worse with no improvement. No vaginal discharge, no rashes ulcerations   Hospital Course:  1-DKA, type 1: most likely secondary to urinary tract infection and insulin Pump setting were not adequate.  Anion Gap close, Bicarb increase to 24. She was transition to Lantus. Meals coverage, SSI.  She will likely be discharge on Lantus. She will follow up with Endocrinologist.   lantus to 25 units and meal coverage 6 units.  2-UTI/Pyelonephritis:  Patient presents with nausea, vomiting, UA with Too numerous to count WBC.  Patient received Ceftriaxone for 4 days. renal US: Increased cortical echogenicity bilaterally compatible with medical renal parenchymal disease. Echogenic material is present in the right renal pelvis as well as  debris in the bladder. This is consistent with a history of pyuria. Unfortunate Urine culture show no growth.  Blood culture no growth..  GC, Chlamydia negative, HIV non reactive.  Patient will be discharge on Cipro to complete total 14 days. 3-Acute renal failure: Pre renal, secondary to decrease volume. Improved with IV fluids. Resolved. 4-Nausea and vomiting: Probably secondary to DKA, Pyelonephritis. Resolved.  5-Hyponatremia: Pseudohyponatremia, and secondary to dehydration. 6-Metabolic acidosis: Likely to DKA, Lactic acid at 2.2. Resolved.  7-Abdominal Pain/ Flank pain: Likely secondary Pyelo.  CT abdomen, negative, only show pyelo.Marland Kitchen  8-Hypokalemia: resolved with potassium supplement.    Procedures: None  Consultations:  None  Discharge Exam: Filed Vitals:   02/06/12 0454 02/06/12 1336 02/06/12 2237 02/07/12 0632  BP: 115/57 104/71 115/82 99/65  Pulse: 103 96 99 90  Temp: 98.3 F (36.8 C) 98.4 F (36.9 C) 99.2 F (37.3 C) 98.6 F (37 C)  TempSrc: Oral Oral Oral Oral  Resp: 18 18 18 18   Height:      Weight: 57 kg (125 lb 10.6 oz)   56.881 kg (125 lb 6.4 oz)  SpO2: 100% 100% 99% 98%    General: No distress. Cardiovascular: S 1 , S 2 RRR Respiratory: CTA  Discharge Instructions  Discharge Orders    Future Appointments: Provider: Department: Dept Phone: Center:   03/01/2012 2:15 PM Vevelyn Royals, RD [facility] [phone] NDM     Future Orders Please Complete By Expires   Ambulatory referral to Nutrition and Diabetic Education      Scheduling Instructions:   Patient has  Type 1 Diabetes and Omnipod insulin pump.   Diet Carb Modified      Increase activity slowly          Medication List     As of 02/07/2012 11:25 AM    STOP taking these medications         ibuprofen 200 MG tablet   Commonly known as: ADVIL,MOTRIN      insulin pump 100 unit/ml Soln      meperidine 50 MG tablet   Commonly known as: DEMEROL       TAKE these medications         ciprofloxacin 500 MG tablet   Commonly known as: CIPRO   Take 1 tablet (500 mg total) by mouth 2 (two) times daily.      ferrous sulfate 325 (65 FE) MG tablet   Take 1 tablet (325 mg total) by mouth 2 (two) times daily with a meal.      HYDROcodone-acetaminophen 5-325 MG per tablet   Commonly known as: NORCO/VICODIN   Take 1 tablet by mouth every 6 (six) hours as needed.      insulin aspart 100 UNIT/ML injection   Commonly known as: novoLOG   Inject 6 Units into the skin 3 (three) times daily with meals.      insulin glargine 100 UNIT/ML injection   Commonly known as: LANTUS   Inject 25 Units into the skin at bedtime.      polyethylene glycol packet   Commonly known as: MIRALAX / GLYCOLAX   Take 17 g by mouth daily as needed.      vitamin B-12 100 MCG tablet   Commonly known as: CYANOCOBALAMIN   Take 100 mcg by mouth daily.      vitamin C 500 MG tablet   Commonly known as: ASCORBIC ACID   Take 500 mg by mouth daily.          The results of significant diagnostics from this hospitalization (including imaging, microbiology, ancillary and laboratory) are listed below for reference.    Significant Diagnostic Studies: Dg Chest 2 View  02/03/2012  *RADIOLOGY REPORT*  Clinical Data: Bilateral flank pain.  Back pain.  Short of breath.  CHEST - 2 VIEW  Comparison: 10/18/2011.  Findings: Bilateral nipple studs are present.  Cardiopericardial silhouette within normal limits. Mediastinal contours normal. Trachea midline.  No airspace disease or effusion.  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  IMPRESSION: No active cardiopulmonary disease.   Original Report Authenticated By: Andreas Newport, M.D.    Ct Abdomen Pelvis W Contrast  02/06/2012  *RADIOLOGY REPORT*  Clinical Data: Persistent abdominal pain.  CT ABDOMEN AND PELVIS WITH CONTRAST  Technique:  Multidetector CT imaging of the abdomen and pelvis was performed  following the standard protocol during bolus administration of intravenous contrast.  Contrast: OMNIPAQUE IOHEXOL 300 MG/ML  SOLN  Comparison: CT scan dated 02/04/2007  Findings: On the noncontrast images there are vague areas of abnormal lucency in the medial aspect of the mid left kidney and in the medial aspect of the mid right kidney.  There are also two small areas of cortical lucency in the right lower pole and a vague area of lucency in the left lower pole.  These could represent areas of mild pyelonephritis.  There is slight haziness in the perinephric soft tissues.  No obstruction.  The liver, pancreas, and adrenal glands are normal.  Calcified granulomas in the otherwise normal spleen.  The bowel is normal including  the terminal ileum and appendix.  No adenopathy.  Uterus and ovaries are normal.  Small amount of free fluid in the pelvic cul-de-sac, normal for a female of this age.  No significant osseous abnormality.  IMPRESSION:  1.  Vague areas of lucency in both kidneys suggesting mild pyelonephritis. 2.  Otherwise, benign-appearing abdomen and pelvis.   Original Report Authenticated By: Francene Boyers, M.D.    US Renal  02/04/2012  *RADIOLOGY REPORT*  Clinical Data: Pyuria  RENAL/URINARY TRACT ULTRASOUND COMPLETE  Comparison:  05/29/2007  Findings: The spleen is borderline prominent with a greatest diameter of 12.7 cm.  Right Kidney:  11.5 cm in length.  Cortex is slightly echogenic and heterogeneous. Isoechoic material is present within the renal pelvis consistent with the history of pyuria.  No solid mass.  No hydronephrosis.  Left Kidney:  10.7 cm in length.  Increased cortical echogenicity. No hydronephrosis or mass.  Bladder:  There is some debris within the bladder.  The wall of the bladder is slightly thickened.  IMPRESSION: Increased cortical echogenicity bilaterally compatible with medical renal parenchymal disease.  Echogenic material is present in the right renal pelvis as well as  debris in the bladder.  This is consistent with a history of pyuria.  Correlate with urinalysis.   Original Report Authenticated By: Jolaine Click, M.D.     Microbiology: Recent Results (from the past 240 hour(s))  URINE CULTURE     Status: Normal   Collection Time   02/03/12 10:42 PM      Component Value Range Status Comment   Specimen Description URINE, CLEAN CATCH   Final    Special Requests NONE   Final    Culture  Setup Time 02/04/2012 06:46   Final    Colony Count NO GROWTH   Final    Culture NO GROWTH   Final    Report Status 02/05/2012 FINAL   Final   MRSA PCR SCREENING     Status: Normal   Collection Time   02/04/12  3:25 AM      Component Value Range Status Comment   MRSA by PCR NEGATIVE  NEGATIVE Final   CULTURE, BLOOD (ROUTINE X 2)     Status: Normal (Preliminary result)   Collection Time   02/04/12  5:30 AM      Component Value Range Status Comment   Specimen Description BLOOD RIGHT ARM   Final    Special Requests BOTTLES DRAWN AEROBIC AND ANAEROBIC 5CC   Final    Culture  Setup Time 02/04/2012 08:25   Final    Culture     Final    Value:        BLOOD CULTURE RECEIVED NO GROWTH TO DATE CULTURE WILL BE HELD FOR 5 DAYS BEFORE ISSUING A FINAL NEGATIVE REPORT   Report Status PENDING   Incomplete   CULTURE, BLOOD (ROUTINE X 2)     Status: Normal (Preliminary result)   Collection Time   02/04/12  5:30 AM      Component Value Range Status Comment   Specimen Description BLOOD RIGHT ARM   Final    Special Requests BOTTLES DRAWN AEROBIC AND ANAEROBIC 5CC   Final    Culture  Setup Time 02/04/2012 08:25   Final    Culture     Final    Value:        BLOOD CULTURE RECEIVED NO GROWTH TO DATE CULTURE WILL BE HELD FOR 5 DAYS BEFORE ISSUING A FINAL NEGATIVE REPORT   Report  Status PENDING   Incomplete   GC/CHLAMYDIA PROBE AMP     Status: Normal   Collection Time   02/04/12  3:10 PM      Component Value Range Status Comment   CT Probe RNA NEGATIVE  NEGATIVE Final    GC Probe RNA  NEGATIVE  NEGATIVE Final      Labs: Basic Metabolic Panel:  Lab 02/07/12 1610 02/06/12 0518 02/05/12 0350 02/04/12 1754 02/04/12 1330  NA 134* 133* 131* 130* 133*  K 3.6 3.7 3.7 3.7 3.4*  CL 100 98 98 98 102  CO2 26 25 24 24 22   GLUCOSE 214* 265* 297* 155* 141*  BUN 7 8 5* 7 9  CREATININE 0.45* 0.52 0.53 0.64 0.72  CALCIUM 8.6 8.4 8.1* 8.2* 8.2*  MG -- -- -- -- --  PHOS -- -- -- -- --   Liver Function Tests:  Lab 02/03/12 2330  AST 12  ALT 10  ALKPHOS 115  BILITOT 0.3  PROT 9.4*  ALBUMIN 3.9   No results found for this basename: LIPASE:5,AMYLASE:5 in the last 168 hours No results found for this basename: AMMONIA:5 in the last 168 hours CBC:  Lab 02/06/12 0518 02/05/12 0845 02/04/12 0525 02/03/12 2348 02/03/12 2330  WBC 3.4* 4.0 6.6 -- 8.4  NEUTROABS -- -- -- -- 7.3  HGB 9.3* 9.9* 11.3* 15.0 14.0  HCT 27.2* 28.7* 32.7* 44.0 39.3  MCV 88.6 86.7 87.2 -- 87.7  PLT 144* 136* 175 -- 218   Cardiac Enzymes: No results found for this basename: CKTOTAL:5,CKMB:5,CKMBINDEX:5,TROPONINI:5 in the last 168 hours BNP: BNP (last 3 results) No results found for this basename: PROBNP:3 in the last 8760 hours CBG:  Lab 02/07/12 1118 02/07/12 0729 02/06/12 2120 02/06/12 1640 02/06/12 1149  GLUCAP 254* 149* 361* 285* 210*       Signed:  Shizue Kaseman  [facility] 02/07/2012, 11:26 AM

## 2012-02-07 NOTE — Progress Notes (Signed)
Inpatient Diabetes Program Recommendations  AACE/ADA: New Consensus Statement on Inpatient Glycemic Control (2013)  Target Ranges:  Prepandial:   less than 140 mg/dL      Peak postprandial:   less than 180 mg/dL (1-2 hours)      Critically ill patients:  140 - 180 mg/dL   Reason for Visit: F/U - DKA  Results for [name], [name] (MRN [id number]) as of 02/07/2012 11:31  Ref. Range 02/06/2012 07:39 02/06/2012 11:49 02/06/2012 16:40 02/06/2012 21:20 02/07/2012 07:29 02/07/2012 11:18  Glucose-Capillary Latest Range: 70-99 mg/dL 409 (H) 811 (H) 914 (H) 361 (H) 149 (H) 254 (H)      Results for [name], [name] (MRN [id number]) as of 02/07/2012 11:31  Ref. Range 02/05/2012 03:50 02/06/2012 05:18 02/07/2012 05:35  Glucose Latest Range: 70-99 mg/dL 086 (H) 578 (H) 469 (H)    Pt to be discharged this am.  Blood sugars much improved.  Going home on SQ Lantus and Novolog.  Discussed injection sites, CHO counting, exercise, and stress management.  Pt verbalizes wanting to control blood sugars and is looking forward to having new endocrinologist.  Has appt on Jan. 8th with Dr. Devonne Doughty.  Pt seems to be positive and states she doesn't have problems in getting meter supplies and insulin.  Understands CHO counting and knows how to adjust meal coverage insulin.  Recommended 1:12 ratio.  Discussed above with Dr. Sunnie Nielsen and RN.  Pt to give herself the insulin injection at lunch today.  Answered questions.  To be discharged this afternoon.

## 2012-02-07 NOTE — Progress Notes (Signed)
Patient discharged home in stable condition.  Discharge instructions and scripts given with verbal feedback and understanding.  Patient demonstrated giving self insulin before discharge.  No further questions at this time.  MyChart set-up before discharge.

## 2012-02-10 LAB — CULTURE, BLOOD (ROUTINE X 2): Culture: NO GROWTH

## 2012-03-01 ENCOUNTER — Encounter: Payer: Self-pay | Admitting: *Deleted

## 2012-03-01 ENCOUNTER — Encounter: Payer: Medicaid Other | Attending: Internal Medicine | Admitting: *Deleted

## 2012-03-01 VITALS — Ht 64.0 in | Wt 120.5 lb

## 2012-03-01 DIAGNOSIS — E1010 Type 1 diabetes mellitus with ketoacidosis without coma: Secondary | ICD-10-CM | POA: Insufficient documentation

## 2012-03-01 DIAGNOSIS — Z713 Dietary counseling and surveillance: Secondary | ICD-10-CM | POA: Insufficient documentation

## 2012-03-01 NOTE — Progress Notes (Signed)
  Medical Nutrition Therapy:  Appt start time: 1430 end time:  1600.  Assessment:  Primary concerns today: diabetes with recurrent DKA. Was on OmniPod insulin pump prior to last admission. She is here with her 2 children, 33 year old son who participates in her care, especially when BG excursions occur and her younger daughter. She states over 10 hospitalizations for DKA in the past year, per [facility], 4 hospitalizations are noted. Also noted loss of baby at 6 1/2 months gestation about 1 1/2 years ago, which she brought up during our visit. History of not showing up for appointments repeatedly and dismissal of last Endocrinology practice for that reason.  MEDICATIONS: see list   DIETARY INTAKE:  Usual eating pattern includes 3 meals and 3 snacks per day.  Everyday foods include variety of all food groups.  Avoided foods include regular soda, most sweets.    24-hr recall:  B ( AM): regular oatmeal with Splenda or honey OR fresh fruit OR vegetables, water or diet soda Snk ( AM): fresh vegetables with Ranch dressing OR Cheese doodles  L ( PM): sandwich or left overs, OR soup and cheese doodles Snk ( PM): same as AM D ( PM): meat, 2 vegetables, small starch occasionally, water, diet soda, or skim milk Snk ( PM): cheese doodles OR multigrain tortilla chips with salsa Beverages: water, diet soda, skim milk  Usual physical activity: nothing scheduled, not feeling too well lately  Estimated energy needs: 1600 calories 180 g carbohydrates 120 g protein 44 g fat  Progress Towards Goal(s):  In progress.   Nutritional Diagnosis:  NB-1.1 Food and nutrition-related knowledge deficit As related to diabetes management.  As evidenced by multiple hospitalizations for DKA.    Intervention:  Nutrition counseling and diabetes education initiated. Discussed basic physiology of diabetes, SMBG and rationale of checking BG at alternate times of day, A1c, Carb Counting and reading food labels. Reviewed insulin  action and importance of not omitting insulin due to risk of DKA.  Discussed rationale of using a Carb Ratio for more consistent BG results to food and moving in that direction from set dose at all 3 meals. Also discussed importance of obtaining a PCP who can refer her to an Endocrinologist, as well as the importance of keeping appointments when made.  Plan:  Aim for 3-4 Carb Choices per meal (45-60 grams)   Aim for 0-2 Carbs per snack if hungry  Consider reading food labels for Total Carbohydrate of foods Continue checking BG at alternate times per day as directed by MD  Consider taking 1 unit of Novolog insulin for each 15 grams of carbohydrate.  Handouts given during visit include: Living Well with Diabetes Carb Counting and Food Label handouts Meal Plan Card  Diabetes Medication List  Monitoring/Evaluation:  Dietary intake, exercise, reading food labels, and body weight prn. Patient needs to see PCP and Endocrinologist so no appointment date set for follow up here until those have been scheduled.

## 2012-03-01 NOTE — Patient Instructions (Signed)
Plan:  Aim for 3-4 Carb Choices per meal (45-60 grams)   Aim for 0-2 Carbs per snack if hungry  Consider reading food labels for Total Carbohydrate of foods Continue checking BG at alternate times per day as directed by MD  Consider taking 1 unit of Novolog insulin for each 15 grams of carbohydrate.

## 2012-03-02 ENCOUNTER — Ambulatory Visit: Payer: Medicaid Other | Admitting: Internal Medicine

## 2012-03-17 ENCOUNTER — Ambulatory Visit: Payer: Medicaid Other | Admitting: Internal Medicine

## 2012-04-15 ENCOUNTER — Encounter (HOSPITAL_COMMUNITY): Payer: Self-pay | Admitting: Emergency Medicine

## 2012-04-15 ENCOUNTER — Inpatient Hospital Stay (HOSPITAL_COMMUNITY)
Admission: EM | Admit: 2012-04-15 | Discharge: 2012-04-16 | DRG: 638 | Disposition: A | Discharge status: Home / Self Care | Payer: Medicaid Other | Attending: Internal Medicine | Admitting: Internal Medicine

## 2012-04-15 ENCOUNTER — Inpatient Hospital Stay (HOSPITAL_COMMUNITY): Payer: Medicaid Other

## 2012-04-15 DIAGNOSIS — R112 Nausea with vomiting, unspecified: Secondary | ICD-10-CM

## 2012-04-15 DIAGNOSIS — N390 Urinary tract infection, site not specified: Secondary | ICD-10-CM

## 2012-04-15 DIAGNOSIS — N12 Tubulo-interstitial nephritis, not specified as acute or chronic: Secondary | ICD-10-CM

## 2012-04-15 DIAGNOSIS — F411 Generalized anxiety disorder: Secondary | ICD-10-CM | POA: Diagnosis present

## 2012-04-15 DIAGNOSIS — Z9641 Presence of insulin pump (external) (internal): Secondary | ICD-10-CM

## 2012-04-15 DIAGNOSIS — Z794 Long term (current) use of insulin: Secondary | ICD-10-CM

## 2012-04-15 DIAGNOSIS — R4182 Altered mental status, unspecified: Secondary | ICD-10-CM

## 2012-04-15 DIAGNOSIS — E1110 Type 2 diabetes mellitus with ketoacidosis without coma: Secondary | ICD-10-CM

## 2012-04-15 DIAGNOSIS — N179 Acute kidney failure, unspecified: Secondary | ICD-10-CM

## 2012-04-15 DIAGNOSIS — K219 Gastro-esophageal reflux disease without esophagitis: Secondary | ICD-10-CM | POA: Diagnosis present

## 2012-04-15 DIAGNOSIS — E1049 Type 1 diabetes mellitus with other diabetic neurological complication: Secondary | ICD-10-CM | POA: Diagnosis present

## 2012-04-15 DIAGNOSIS — E1010 Type 1 diabetes mellitus with ketoacidosis without coma: Principal | ICD-10-CM

## 2012-04-15 DIAGNOSIS — E1065 Type 1 diabetes mellitus with hyperglycemia: Secondary | ICD-10-CM | POA: Diagnosis present

## 2012-04-15 DIAGNOSIS — K3184 Gastroparesis: Secondary | ICD-10-CM | POA: Diagnosis present

## 2012-04-15 DIAGNOSIS — E860 Dehydration: Secondary | ICD-10-CM

## 2012-04-15 DIAGNOSIS — D649 Anemia, unspecified: Secondary | ICD-10-CM

## 2012-04-15 DIAGNOSIS — G9341 Metabolic encephalopathy: Secondary | ICD-10-CM

## 2012-04-15 DIAGNOSIS — Z79899 Other long term (current) drug therapy: Secondary | ICD-10-CM

## 2012-04-15 DIAGNOSIS — E871 Hypo-osmolality and hyponatremia: Secondary | ICD-10-CM

## 2012-04-15 LAB — BLOOD GAS, ARTERIAL
Patient temperature: 98.6
TCO2: 5.7 mmol/L (ref 0–100)
pH, Arterial: 7.224 — ABNORMAL LOW (ref 7.350–7.450)

## 2012-04-15 LAB — BASIC METABOLIC PANEL
BUN: 15 mg/dL (ref 6–23)
BUN: 13 mg/dL (ref 6–23)
BUN: 13 mg/dL (ref 6–23)
BUN: 12 mg/dL (ref 6–23)
BUN: 10 mg/dL (ref 6–23)
CO2: 14 mEq/L — ABNORMAL LOW (ref 19–32)
CO2: 17 mEq/L — ABNORMAL LOW (ref 19–32)
CO2: 15 mEq/L — ABNORMAL LOW (ref 19–32)
Calcium: 7.7 mg/dL — ABNORMAL LOW (ref 8.4–10.5)
Calcium: 7.3 mg/dL — ABNORMAL LOW (ref 8.4–10.5)
Calcium: 7.5 mg/dL — ABNORMAL LOW (ref 8.4–10.5)
Calcium: 7.3 mg/dL — ABNORMAL LOW (ref 8.4–10.5)
Chloride: 89 mEq/L — ABNORMAL LOW (ref 96–112)
Chloride: 96 mEq/L (ref 96–112)
Chloride: 104 mEq/L (ref 96–112)
Creatinine, Ser: 0.69 mg/dL (ref 0.50–1.10)
Creatinine, Ser: 0.6 mg/dL (ref 0.50–1.10)
Creatinine, Ser: 0.61 mg/dL (ref 0.50–1.10)
Creatinine, Ser: 0.56 mg/dL (ref 0.50–1.10)
Creatinine, Ser: 0.46 mg/dL — ABNORMAL LOW (ref 0.50–1.10)
GFR calc Af Amer: >90 mL/min (ref >90)
GFR calc Af Amer: >90 mL/min (ref >90)
GFR calc non Af Amer: >90 mL/min (ref >90)
GFR calc non Af Amer: >90 mL/min (ref >90)
Glucose, Bld: 215 mg/dL — ABNORMAL HIGH (ref 70–99)
Glucose, Bld: 116 mg/dL — ABNORMAL HIGH (ref 70–99)
Glucose, Bld: 124 mg/dL — ABNORMAL HIGH (ref 70–99)
Potassium: 3.9 mEq/L (ref 3.5–5.1)

## 2012-04-15 LAB — CBC WITH DIFFERENTIAL/PLATELET
Basophils Absolute: 0 10*3/uL (ref 0.0–0.1)
Basophils Relative: 0 % (ref 0–1)
Hemoglobin: 14.1 g/dL (ref 12.0–15.0)
MCHC: 34.8 g/dL (ref 30.0–36.0)
Monocytes Relative: 5 % (ref 3–12)
Neutro Abs: 8.3 10*3/uL — ABNORMAL HIGH (ref 1.7–7.7)
Neutrophils Relative %: 72 % (ref 43–77)
RDW: 13.4 % (ref 11.5–15.5)

## 2012-04-15 LAB — GLUCOSE, CAPILLARY
Glucose-Capillary: 125 mg/dL — ABNORMAL HIGH (ref 70–99)
Glucose-Capillary: 152 mg/dL — ABNORMAL HIGH (ref 70–99)
Glucose-Capillary: 178 mg/dL — ABNORMAL HIGH (ref 70–99)
Glucose-Capillary: 198 mg/dL — ABNORMAL HIGH (ref 70–99)
Glucose-Capillary: 210 mg/dL — ABNORMAL HIGH (ref 70–99)
Glucose-Capillary: 270 mg/dL — ABNORMAL HIGH (ref 70–99)
Glucose-Capillary: 334 mg/dL — ABNORMAL HIGH (ref 70–99)
Glucose-Capillary: 448 mg/dL — ABNORMAL HIGH (ref 70–99)
Glucose-Capillary: 84 mg/dL (ref 70–99)

## 2012-04-15 LAB — COMPREHENSIVE METABOLIC PANEL
ALT: 17 U/L (ref 0–35)
AST: 15 U/L (ref 0–37)
Albumin: 4.3 g/dL (ref 3.5–5.2)
Alkaline Phosphatase: 111 U/L (ref 39–117)
Chloride: 72 mEq/L — ABNORMAL LOW (ref 96–112)
Potassium: 4.3 mEq/L (ref 3.5–5.1)
Total Bilirubin: 0.3 mg/dL (ref 0.3–1.2)

## 2012-04-15 LAB — URINE MICROSCOPIC-ADD ON

## 2012-04-15 LAB — URINALYSIS, ROUTINE W REFLEX MICROSCOPIC
Glucose, UA: >1000 mg/dL — AB
Hgb urine dipstick: NEGATIVE
Ketones, ur: >80 mg/dL — AB
Protein, ur: 30 mg/dL — AB

## 2012-04-15 LAB — RAPID STREP SCREEN (MED CTR MEBANE ONLY): Streptococcus, Group A Screen (Direct): NEGATIVE

## 2012-04-15 MED ORDER — DEXTROSE 50 % IV SOLN: 25.0000 mL | INTRAVENOUS | Status: DC | PRN

## 2012-04-15 MED ORDER — ONDANSETRON HCL 4 MG/2ML IJ SOLN
4.0000 mg | Freq: Once | INTRAMUSCULAR | Status: AC
  Administered 2012-04-15: 4 mg via INTRAVENOUS
  Filled 2012-04-15: qty 2

## 2012-04-15 MED ORDER — INSULIN ASPART 100 UNIT/ML ~~LOC~~ SOLN
5.0000 [IU] | Freq: Three times a day (TID) | SUBCUTANEOUS | Status: DC
  Administered 2012-04-15: 18:00:00 via SUBCUTANEOUS
  Administered 2012-04-16: 5 [IU] via SUBCUTANEOUS

## 2012-04-15 MED ORDER — FAMOTIDINE IN NACL 20-0.9 MG/50ML-% IV SOLN
20.0000 mg | Freq: Two times a day (BID) | INTRAVENOUS | Status: DC
  Administered 2012-04-15 (×3): 20 mg via INTRAVENOUS
  Filled 2012-04-15 (×5): qty 50

## 2012-04-15 MED ORDER — ONDANSETRON HCL 4 MG PO TABS: 4.0000 mg | ORAL_TABLET | Freq: Four times a day (QID) | ORAL | Status: DC | PRN

## 2012-04-15 MED ORDER — POTASSIUM CHLORIDE 10 MEQ/100ML IV SOLN
10.0000 meq | INTRAVENOUS | Status: AC
  Administered 2012-04-15 (×2): 10 meq via INTRAVENOUS
  Filled 2012-04-15: qty 100

## 2012-04-15 MED ORDER — HYDROMORPHONE HCL PF 1 MG/ML IJ SOLN
1.0000 mg | INTRAMUSCULAR | Status: DC | PRN
  Administered 2012-04-15: 1 mg via INTRAVENOUS
  Administered 2012-04-15: 2 mg via INTRAVENOUS
  Administered 2012-04-15 – 2012-04-16 (×7): 1 mg via INTRAVENOUS
  Filled 2012-04-15 (×10): qty 1

## 2012-04-15 MED ORDER — SODIUM CHLORIDE 0.9 % IV SOLN: INTRAVENOUS | Status: DC

## 2012-04-15 MED ORDER — INSULIN ASPART 100 UNIT/ML ~~LOC~~ SOLN
0.0000 [IU] | Freq: Every day | SUBCUTANEOUS | Status: DC
  Administered 2012-04-15: 3 [IU] via SUBCUTANEOUS

## 2012-04-15 MED ORDER — INSULIN GLARGINE 100 UNIT/ML ~~LOC~~ SOLN: 15.0000 [IU] | Freq: Every day | SUBCUTANEOUS | Status: DC

## 2012-04-15 MED ORDER — SODIUM CHLORIDE 0.9 % IV SOLN
INTRAVENOUS | Status: AC
  Administered 2012-04-15: 06:00:00 via INTRAVENOUS

## 2012-04-15 MED ORDER — SODIUM CHLORIDE 0.9 % IV SOLN
INTRAVENOUS | Status: DC
  Administered 2012-04-15: 2.7 [IU]/h via INTRAVENOUS
  Filled 2012-04-15: qty 1

## 2012-04-15 MED ORDER — SODIUM CHLORIDE 0.9 % IV SOLN
INTRAVENOUS | Status: DC
  Administered 2012-04-15: 3 [IU]/h via INTRAVENOUS
  Filled 2012-04-15: qty 1

## 2012-04-15 MED ORDER — ENOXAPARIN SODIUM 40 MG/0.4ML ~~LOC~~ SOLN: 40.0000 mg | SUBCUTANEOUS | Status: DC

## 2012-04-15 MED ORDER — ENOXAPARIN SODIUM 40 MG/0.4ML ~~LOC~~ SOLN
40.0000 mg | SUBCUTANEOUS | Status: DC
  Administered 2012-04-15: 40 mg via SUBCUTANEOUS
  Filled 2012-04-15 (×2): qty 0.4

## 2012-04-15 MED ORDER — ONDANSETRON HCL 4 MG/2ML IJ SOLN
4.0000 mg | Freq: Four times a day (QID) | INTRAMUSCULAR | Status: DC | PRN
  Administered 2012-04-15 – 2012-04-16 (×2): 4 mg via INTRAVENOUS
  Filled 2012-04-15 (×2): qty 2

## 2012-04-15 MED ORDER — SODIUM CHLORIDE 0.9 % IJ SOLN
3.0000 mL | Freq: Two times a day (BID) | INTRAMUSCULAR | Status: DC
  Administered 2012-04-15: 3 mL via INTRAVENOUS

## 2012-04-15 MED ORDER — DEXTROSE-NACL 5-0.45 % IV SOLN
INTRAVENOUS | Status: DC
  Administered 2012-04-15: 100 mL/h via INTRAVENOUS
  Administered 2012-04-15: 15:00:00 via INTRAVENOUS
  Administered 2012-04-16: 100 mL via INTRAVENOUS

## 2012-04-15 MED ORDER — INSULIN GLARGINE 100 UNIT/ML ~~LOC~~ SOLN
10.0000 [IU] | Freq: Once | SUBCUTANEOUS | Status: AC
  Administered 2012-04-15: 17:00:00 via SUBCUTANEOUS

## 2012-04-15 MED ORDER — SODIUM CHLORIDE 0.9 % IV BOLUS (SEPSIS)
1000.0000 mL | Freq: Once | INTRAVENOUS | Status: AC
  Administered 2012-04-15: 1000 mL via INTRAVENOUS

## 2012-04-15 MED ORDER — HYDROMORPHONE HCL PF 1 MG/ML IJ SOLN
1.0000 mg | Freq: Once | INTRAMUSCULAR | Status: AC
  Administered 2012-04-15: 1 mg via INTRAVENOUS
  Filled 2012-04-15: qty 1

## 2012-04-15 MED ORDER — INSULIN ASPART 100 UNIT/ML ~~LOC~~ SOLN
0.0000 [IU] | Freq: Three times a day (TID) | SUBCUTANEOUS | Status: DC
  Administered 2012-04-15: 3 [IU] via SUBCUTANEOUS
  Administered 2012-04-16: 11 [IU] via SUBCUTANEOUS

## 2012-04-15 NOTE — ED Provider Notes (Signed)
History     CSN: [id number]  Arrival date & time 04/15/12  0005   First MD Initiated Contact with Patient 04/15/12 0117      Chief Complaint  Patient presents with  . Hyperglycemia   HPI  History provided by the patient. Patient is a 32 year old female with type 1 diabetes who presents with complaints of elevated blood sugars and concerns for DKA. Patient states that she sort of began feeling sick with slight sore throat and rhinorrhea one to 2 days ago. She noticed some elevated blood sugars but today began feeling poorly. Her sugar was much higher she fell abdominal cramps and pains with some shortness of breath. She has been trying to use her home insulin but symptoms are not improving. Patient states she is a "brittle diabetic" and has had DKA in the past and symptoms feel similar to today. Patient currently is without primary care provider. Denies any other aggravating or alleviating factors. Denies any other associated symptoms     Past Medical History  Diagnosis Date  . Diabetes mellitus   . Thyroid disease   . Back pain   . Panic attack   . PONV (postoperative nausea and vomiting)   . Anxiety   . GERD (gastroesophageal reflux disease)   . DM gastroparesis     Past Surgical History  Procedure Laterality Date  . Laparoscopy      age 51  . Back surgery      age 41  . Tooth extraction      Family History  Problem Relation Age of Onset  . Other Mother   . Other Father     History  Substance Use Topics  . Smoking status: Former Smoker    Quit date: 04/19/2000  . Smokeless tobacco: Former Neurosurgeon  . Alcohol Use: Yes     Comment: social, none now 03/01/2012    OB History   Grav Para Term Preterm Abortions TAB SAB Ect Mult Living                  Review of Systems  Constitutional: Negative for fever.  HENT: Positive for sore throat and rhinorrhea.   Respiratory: Positive for shortness of breath. Negative for cough.   Cardiovascular: Negative for chest pain.   Gastrointestinal: Positive for nausea and abdominal pain. Negative for vomiting, diarrhea and constipation.  Genitourinary: Negative for dysuria, frequency, hematuria and flank pain.  All other systems reviewed and are negative.    Allergies  Scallops; Morphine and related; and Tylenol  Home Medications   Current Outpatient Rx  Name  Route  Sig  Dispense  Refill  . ciprofloxacin (CIPRO) 500 MG tablet   Oral   Take 1 tablet (500 mg total) by mouth 2 (two) times daily.   16 tablet   0   . ferrous sulfate 325 (65 FE) MG tablet   Oral   Take 1 tablet (325 mg total) by mouth 2 (two) times daily with a meal.   60 tablet   0   . HYDROcodone-acetaminophen (NORCO/VICODIN) 5-325 MG per tablet   Oral   Take 1 tablet by mouth every 6 (six) hours as needed.   30 tablet   0   . insulin aspart (NOVOLOG) 100 UNIT/ML injection   Subcutaneous   Inject 6 Units into the skin 3 (three) times daily with meals.   1 vial   3   . insulin glargine (LANTUS) 100 UNIT/ML injection   Subcutaneous   Inject 25  Units into the skin at bedtime.   10 mL   0   . polyethylene glycol (MIRALAX / GLYCOLAX) packet   Oral   Take 17 g by mouth daily as needed.   14 each   0   . vitamin B-12 (CYANOCOBALAMIN) 100 MCG tablet   Oral   Take 100 mcg by mouth daily.          . vitamin C (ASCORBIC ACID) 500 MG tablet   Oral   Take 500 mg by mouth daily.           BP 116/78  Pulse 96  Temp(Src) 98 F (36.7 C) (Oral)  Resp 24  SpO2 100%  Physical Exam  Nursing note and vitals reviewed. Constitutional: She is oriented to person, place, and time. She appears well-developed and well-nourished. No distress.  HENT:  Head: Normocephalic.  Mouth/Throat: Oropharynx is clear and moist.  Neck: Normal range of motion. Neck supple.  No meningeal sign  Cardiovascular: Normal rate and regular rhythm.   Pulmonary/Chest: Breath sounds normal. Tachypnea noted. No respiratory distress.  Abdominal: Soft.  She exhibits distension. There is no rebound and no guarding.  Diffuse tenderness  Musculoskeletal: Normal range of motion.  Neurological: She is alert and oriented to person, place, and time.  Skin: Skin is warm and dry.  Flushed  Psychiatric: She has a normal mood and affect. Her behavior is normal.    ED Course  Procedures  CRITICAL CARE Performed by: Angus Seller   Total critical care time: 30  Critical care time was exclusive of separately billable procedures and treating other patients.  Critical care was necessary to treat or prevent imminent or life-threatening deterioration.  Critical care was time spent personally by me on the following activities: development of treatment plan with patient and/or surrogate as well as nursing, discussions with consultants, evaluation of patient's response to treatment, examination of patient, obtaining history from patient or surrogate, ordering and performing treatments and interventions, ordering and review of laboratory studies, ordering and review of radiographic studies, pulse oximetry and re-evaluation of patient's condition.      Results for orders placed during the hospital encounter of 04/15/12  GLUCOSE, CAPILLARY      Result Value Range   Glucose-Capillary 448 (*) 70 - 99 mg/dL  CBC WITH DIFFERENTIAL      Result Value Range   WBC 11.4 (*) 4.0 - 10.5 K/uL   RBC 4.52  3.87 - 5.11 MIL/uL   Hemoglobin 14.1  12.0 - 15.0 g/dL   HCT 62.1  30.8 - 65.7 %   MCV 89.6  78.0 - 100.0 fL   MCH 31.2  26.0 - 34.0 pg   MCHC 34.8  30.0 - 36.0 g/dL   RDW 84.6  96.2 - 95.2 %   Platelets 292  150 - 400 K/uL   Neutrophils Relative 72  43 - 77 %   Neutro Abs 8.3 (*) 1.7 - 7.7 K/uL   Lymphocytes Relative 23  12 - 46 %   Lymphs Abs 2.6  0.7 - 4.0 K/uL   Monocytes Relative 5  3 - 12 %   Monocytes Absolute 0.6  0.1 - 1.0 K/uL   Eosinophils Relative 0  0 - 5 %   Eosinophils Absolute 0.0  0.0 - 0.7 K/uL   Basophils Relative 0  0 - 1 %    Basophils Absolute 0.0  0.0 - 0.1 K/uL  COMPREHENSIVE METABOLIC PANEL      Result Value Range  Sodium 123 (*) 135 - 145 mEq/L   Potassium 4.3  3.5 - 5.1 mEq/L   Chloride 72 (*) 96 - 112 mEq/L   CO2 8 (*) 19 - 32 mEq/L   Glucose, Bld 437 (*) 70 - 99 mg/dL   BUN 21  6 - 23 mg/dL   Creatinine, Ser 0.86  0.50 - 1.10 mg/dL   Calcium 8.8  8.4 - 57.8 mg/dL   Total Protein 8.7 (*) 6.0 - 8.3 g/dL   Albumin 4.3  3.5 - 5.2 g/dL   AST 15  0 - 37 U/L   ALT 17  0 - 35 U/L   Alkaline Phosphatase 111  39 - 117 U/L   Total Bilirubin 0.3  0.3 - 1.2 mg/dL   GFR calc non Af Amer 83 (*) >90 mL/min   GFR calc Af Amer >90  >90 mL/min  URINALYSIS, ROUTINE W REFLEX MICROSCOPIC      Result Value Range   Color, Urine YELLOW  YELLOW   APPearance TURBID (*) CLEAR   Specific Gravity, Urine 1.025  1.005 - 1.030   pH 5.0  5.0 - 8.0   Glucose, UA >1000 (*) NEGATIVE mg/dL   Hgb urine dipstick NEGATIVE  NEGATIVE   Bilirubin Urine NEGATIVE  NEGATIVE   Ketones, ur >80 (*) NEGATIVE mg/dL   Protein, ur 30 (*) NEGATIVE mg/dL   Urobilinogen, UA 0.2  0.0 - 1.0 mg/dL   Nitrite NEGATIVE  NEGATIVE   Leukocytes, UA NEGATIVE  NEGATIVE  PREGNANCY, URINE      Result Value Range   Preg Test, Ur NEGATIVE  NEGATIVE  URINE MICROSCOPIC-ADD ON      Result Value Range   Squamous Epithelial / LPF FEW (*) RARE   WBC, UA 0-2  <3 WBC/hpf   Bacteria, UA FEW (*) RARE  BLOOD GAS, ARTERIAL      Result Value Range   O2 Content 2.0     Delivery systems NASAL CANNULA     pH, Arterial 7.224 (*) 7.350 - 7.450   pCO2 arterial 15.2 (*) 35.0 - 45.0 mmHg   pO2, Arterial 161.0 (*) 80.0 - 100.0 mmHg   Bicarbonate 6.1 (*) 20.0 - 24.0 mEq/L   TCO2 5.7  0 - 100 mmol/L   Acid-base deficit 20.4 (*) 0.0 - 2.0 mmol/L   O2 Saturation 98.2     Patient temperature 98.6     Collection site LEFT RADIAL     Drawn by [id number]     Sample type ARTERIAL     Allens test (pass/fail) PASS  PASS  GLUCOSE, CAPILLARY      Result Value Range    Glucose-Capillary 334 (*) 70 - 99 mg/dL   Comment 1 Documented in Chart     Comment 2 Notify RN            1. DKA (diabetic ketoacidoses)       MDM  1:25 AM patient seen and evaluated. Patient with clinical service for DKA. Currently respirations slightly tachypnea. O2 sats normal. We'll also give O2 [employer].   Labs showing CO2 of 8 with anion gap of 43. IV fluids initiated. Glucose protocol ordered. Patient discussed with attending physician. Will continue close monitoring and consult for admission for DKA.  Continuous rechecks the patient performed. She continues to have good respirations and mentation.  1:50AM Spoke with [facility] hospitalist. They will see patient and admit to step down under team 8, as long as pH above 7.1. He would like to be called  back and notified of pH less than 7.0 or 7.1.  PH 7.2. Patient will be admitted to step down.      Angus Seller, [place] 04/15/12 [phone]

## 2012-04-15 NOTE — ED Provider Notes (Signed)
Medical screening examination/treatment/procedure(s) were performed by non-physician practitioner and as supervising physician I was immediately available for consultation/collaboration.  Olivia Mackie, MD 04/15/12 [phone]

## 2012-04-15 NOTE — ED Notes (Signed)
Pt alert, arrives from home, c/o hyperglycemia, onset was today, per pt, CBG >600, gave self nightly dose of insulin, CBG 488 per EMS, resp even unlabored, skin pwd

## 2012-04-15 NOTE — H&P (Signed)
[facility] History and Physical  [name] ZHY:[id number] DOB: Dec 21, 1980    PCP:   [name], MD   Chief Complaint: abdominal cramps, nausea and vomiting.  HPI: [name] is an 32 y.o. female  with multiple medical problems including type 1 diabetes with infrequent DKAs, presents to the emergency room with abdominal pain and pain nausea and vomiting and found to be in DKA with pH of 7.22 and bicarbonate of 8. Further evaluation included leukocytosis with a WBC of 11K , blood glucose greater than 450, creatinine of 0.91 and potassium of 4.3. Patient is alert and oriented and able to give a good history. There has been no fever, chills, cough, dysuria. She had sorethroat for one day.  She had been on insulin pump for the past 2 months, but discontinued the pump today prior to going into the ER.   Hospitalist was asked to admit her for DKA.   Rewiew of Systems:  Constitutional: Negative for malaise, fever and chills. No significant weight loss or weight gain Eyes: Negative for eye pain, redness and discharge, diplopia, visual changes, or flashes of light. ENMT: Negative for ear pain, hoarseness, nasal congestion, sinus pressure. No headaches; tinnitus, drooling, or problem swallowing. Cardiovascular: Negative for chest pain, palpitations, diaphoresis, dyspnea and peripheral edema. ; No orthopnea, PND Respiratory: Negative for cough, hemoptysis, wheezing and stridor. No pleuritic chestpain. Gastrointestinal: Negative for diarrhea, constipation,  melena, blood in stool, hematemesis, jaundice and rectal bleeding.    Genitourinary: Negative for frequency, dysuria, incontinence,flank pain and hematuria; Musculoskeletal: Negative for back pain and neck pain. Negative for swelling and trauma.;  Skin: . Negative for pruritus, rash, abrasions, bruising and skin lesion.; ulcerations Neuro: Negative for headache, lightheadedness and neck stiffness. Negative for weakness,  altered level of consciousness , altered mental status, extremity weakness, burning feet, involuntary movement, seizure and syncope.  Psych: negative for anxiety, depression, insomnia, tearfulness, panic attacks, hallucinations, paranoia, suicidal or homicidal ideation    Past Medical History  Diagnosis Date  . Diabetes mellitus   . Thyroid disease   . Back pain   . Panic attack   . PONV (postoperative nausea and vomiting)   . Anxiety   . GERD (gastroesophageal reflux disease)   . DM gastroparesis     Past Surgical History  Procedure Laterality Date  . Laparoscopy      age 68  . Back surgery      age 75  . Tooth extraction      Medications:  HOME MEDS: Prior to Admission medications   Medication Sig Start Date End Date Taking? Authorizing Provider  Insulin Human (INSULIN PUMP) 100 unit/ml SOLN Inject 1 each into the skin continuous. Novolog in pump   Yes Historical Provider, MD  ondansetron (ZOFRAN-ODT) 8 MG disintegrating tablet Take 8 mg by mouth every 8 (eight) hours as needed for nausea.   Yes Historical Provider, MD  vitamin C (ASCORBIC ACID) 500 MG tablet Take 500 mg by mouth every morning.    Yes Historical Provider, MD     Allergies:  Allergies  Allergen Reactions  . Scallops (Shellfish Allergy) Anaphylaxis  . Morphine And Related Itching  . Tylenol (Acetaminophen) Itching    Social History:   reports that she quit smoking about 11 years ago. She has quit using smokeless tobacco. She reports that  drinks alcohol. She reports that she does not use illicit drugs.  Family History: Family History  Problem Relation Age of Onset  . Other Mother   .  Other Father      Physical Exam: Filed Vitals:   04/15/12 0007 04/15/12 0100 04/15/12 0115 04/15/12 0145  BP: 128/89 116/78 113/73 109/75  Pulse: 97 96 97 94  Temp: 98 F (36.7 C)     TempSrc: Oral     Resp: 22 24 20 17   SpO2: 100% 100% 100% 100%   Blood pressure 109/75, pulse 94, temperature 98 F (36.7  C), temperature source Oral, resp. rate 17, SpO2 100.00%.  GEN:  Pleasant patient lying in the stretcher in no acute distress; cooperative with exam. PSYCH:  alert and oriented x4; does not appear anxious or depressed; affect is appropriate. HEENT: Mucous membranes pink and anicteric; PERRLA; EOM intact; no cervical lymphadenopathy nor thyromegaly or carotid bruit; no JVD; There were no stridor. Neck is very supple. Breasts:: Not examined CHEST WALL: No tenderness CHEST: Normal respiration, clear to auscultation bilaterally.  HEART: Regular rhythm but tachycardic.  There are no murmur, rub, or gallops.   BACK: No kyphosis or scoliosis; no CVA tenderness ABDOMEN: soft and non-tender; no masses, no organomegaly, normal abdominal bowel sounds; no pannus; no intertriginous candida. There is no rebound and no distention. Rectal Exam: Not done EXTREMITIES: No bone or joint deformity; age-appropriate arthropathy of the hands and knees; no edema; no ulcerations.  There is no calf tenderness. Genitalia: not examined PULSES: 2+ and symmetric SKIN: Normal hydration no rash or ulceration CNS: Cranial nerves 2-12 grossly intact no focal lateralizing neurologic deficit.  Speech is fluent; uvula elevated with phonation, facial symmetry and tongue midline. DTR are normal bilaterally, cerebella exam is intact, barbinski is negative and strengths are equaled bilaterally.  No sensory loss.   Labs on Admission:  Basic Metabolic Panel:  Recent Labs Lab 04/15/12 0023  NA 123*  K 4.3  CL 72*  CO2 8*  GLUCOSE 437*  BUN 21  CREATININE 0.91  CALCIUM 8.8   Liver Function Tests:  Recent Labs Lab 04/15/12 0023  AST 15  ALT 17  ALKPHOS 111  BILITOT 0.3  PROT 8.7*  ALBUMIN 4.3   No results found for this basename: LIPASE, AMYLASE,  in the last 168 hours No results found for this basename: AMMONIA,  in the last 168 hours CBC:  Recent Labs Lab 04/15/12 0023  WBC 11.4*  NEUTROABS 8.3*  HGB 14.1   HCT 40.5  MCV 89.6  PLT 292   Cardiac Enzymes: No results found for this basename: CKTOTAL, CKMB, CKMBINDEX, TROPONINI,  in the last 168 hours  CBG:  Recent Labs Lab 04/15/12 0010 04/15/12 0151  GLUCAP 448* 334*     Radiological Exams on Admission: No results found.  EKG: Independently reviewed. Stach, slight prolongation of the Qtc but less than .   Assessment/Plan Present on Admission:  . DKA, type 1 . Dehydration . Hyponatremia  PLAN:  Will admit patient to the SDU for DKA.  Will give IVF along with implementing the glucose stabalizer.   Will tx with analgesics as needed and antiemetics. There is no evidence of infection.  For her sorethroat, will get strept screen.  Her pregnancy test is negative.  I will obtain a CXR as well.  Home medications will be continued except the home insulin since patient is on insulin drip.  Patient is stable, full code, and will be admitted to [facility] service. Thank you for allowing me to partake in the care of your patient.   Other plans as per orders.  Code Status: FULL Unk Lightning, MD.  [facility] Pager [phone] 7pm to 7am.  04/15/2012, 2:25 AM

## 2012-04-15 NOTE — ED Notes (Signed)
CBG 448. 

## 2012-04-15 NOTE — ED Notes (Signed)
Nurse to obtain labs with iv start 

## 2012-04-15 NOTE — Progress Notes (Addendum)
[facility] PROGRESS NOTE  [name] ZOX:[id number] DOB: June 06, 1980 DOA: 04/15/2012 PCP: [name], MD  Brief narrative: 32 y.o. female with multiple medical problems including type 1 diabetes with infrequent DKA presented to the emergency room with abdominal pain and pain nausea and vomiting. Patient was subsequently found to have DKA with pH of 7.22 and bicarbonate of 8. Further evaluation revealed leukocytosis with a WBC of 11K , blood glucose greater than 450, creatinine of 0.91 and potassium of 4.3.    Assessment/Plan:  Principal Problem:   DKA, type 1  Still on insulin drip per DKA protocol  Will obtain BMP for further evaluation and need for insulin drip  Continue IV fluids  Keep NPO Active Problems:   Hyponatremia  Secondary to dehydration  Slowly improving  Continue IV fluids  Follow up BMP in am  Code Status: full code Family Communication: no family at bedside Disposition Plan: home when stable  Manson Passey, MD  [facility] Pager [phone]  If 7PM-7AM, please contact night-coverage [email or web address] Password [facility] 04/15/2012, 5:32 PM   LOS: 0 days   Consultants:  None   Procedures:  None   Antibiotics:  None   HPI/Subjective: No acute events since admission.  Objective: Filed Vitals:   04/15/12 0554 04/15/12 0800 04/15/12 1200 04/15/12 1600  BP: 106/72 102/68 93/68 94/60   Pulse: 88 83 81 88  Temp:  97.5 F (36.4 C) 98.5 F (36.9 C)   TempSrc:  Oral Oral   Resp: 18 17 11 18   Height:      Weight:      SpO2: 100% 99% 99% 93%    Intake/Output Summary (Last 24 hours) at 04/15/12 1732 Last data filed at 04/15/12 1700  Gross per 24 hour  Intake 2780.6 ml  Output    300 ml  Net 2480.6 ml    Exam:   General:  Pt is alert, follows commands appropriately, not in acute distress  Cardiovascular: Regular rate and rhythm, S1/S2, no murmurs, no rubs, no gallops  Respiratory: Clear to auscultation bilaterally, no wheezing,  no crackles, no rhonchi  Abdomen: Soft, non tender, non distended, bowel sounds present, no guarding  Extremities: No edema, pulses DP and PT palpable bilaterally  Neuro: Grossly nonfocal  Data Reviewed: Basic Metabolic Panel:  Recent Labs Lab 04/15/12 0516 04/15/12 0710 04/15/12 0910 04/15/12 1106 04/15/12 1608  NA 126* 130* 130* 128* 134*  K 3.8 3.5 3.9 3.8 3.6  CL 89* 99 98 96 104  CO2 14* 17* 14* 13* 15*  GLUCOSE 215* 116* 124* 205* 152*  BUN 15 13 13 12 10   CREATININE 0.69 0.60 0.61 0.56 0.46*  CALCIUM 7.7* 7.3* 7.4* 7.5* 7.3*   Liver Function Tests:  Recent Labs Lab 04/15/12 0023  AST 15  ALT 17  ALKPHOS 111  BILITOT 0.3  PROT 8.7*  ALBUMIN 4.3   No results found for this basename: LIPASE, AMYLASE,  in the last 168 hours No results found for this basename: AMMONIA,  in the last 168 hours CBC:  Recent Labs Lab 04/15/12 0023  WBC 11.4*  NEUTROABS 8.3*  HGB 14.1  HCT 40.5  MCV 89.6  PLT 292   CBG:  Recent Labs Lab 04/15/12 1110 04/15/12 1202 04/15/12 1410 04/15/12 1508 04/15/12 1610  GLUCAP 202* 198* 125* 119* 152*    Recent Results (from the past 240 hour(s))  RAPID STREP SCREEN     Status: None   Collection Time    04/15/12  2:09 AM  Result Value Range Status   Streptococcus, Group A Screen (Direct) NEGATIVE  NEGATIVE Final   Comment:            DUE TO INADEQUATE SENSITIVITY OF EIA     RAPID TESTS FOR GROUP A STREP (GAS)     IT IS RECOMMENDED THAT ALL NEGATIVE     RESULTS BE FOLLOWED BY A     GROUP A STREP PROBE.  MRSA PCR SCREENING     Status: None   Collection Time    04/15/12  4:00 AM      Result Value Range Status   MRSA by PCR NEGATIVE  NEGATIVE Final   Comment:            The GeneXpert MRSA Assay (FDA     approved for NASAL specimens     only), is one component of a     comprehensive MRSA colonization     surveillance program. It is not     intended to diagnose MRSA     infection nor to guide or     monitor  treatment for     MRSA infections.     Studies: Dg Chest 2 View  04/15/2012  *RADIOLOGY REPORT*  Clinical Data: Nausea, abdominal pain, sore throat.  History of diabetes.  Ex-smoker.  CHEST - 2 VIEW  Comparison: 02/03/2012  Findings: The heart size and pulmonary vascularity are normal. The lungs appear clear and expanded without focal air space disease or consolidation. No blunting of the costophrenic angles.  No pneumothorax.  Mediastinal contours appear intact.  Surgical clips in the base of the neck. There are metallic piercings over the nipples. There is no significant change since previous study.  IMPRESSION: No evidence of active pulmonary disease.   Original Report Authenticated By: Burman Nieves, M.D.     Scheduled Meds: . enoxaparin (LOVENOX) injection  40 mg Subcutaneous Q24H  . famotidine (PEPCID) IV  20 mg Intravenous Q12H  . insulin aspart  0-15 Units Subcutaneous TID WC  . insulin aspart  0-5 Units Subcutaneous QHS  . insulin aspart  5 Units Subcutaneous TID WC  . [START ON 04/16/2012] insulin glargine  15 Units Subcutaneous QHS  . sodium chloride  3 mL Intravenous Q12H   Continuous Infusions: . sodium chloride    . dextrose 5 % and 0.45% NaCl 100 mL/hr at 04/15/12 1513  . insulin (NOVOLIN-R) infusion 0.6 Units/hr (04/15/12 0710)

## 2012-04-15 NOTE — ED Notes (Signed)
Critical CO2 called by Stanton Kidney in lab. CO2 is 8.

## 2012-04-15 NOTE — ED Notes (Signed)
[id number]<AV> Expected date:<BR> Expected time:<BR> Means of arrival:<BR> Comments:<BR> Rm 14, Medic 50, 30 F, DM, CBG 488

## 2012-04-16 DIAGNOSIS — E1110 Type 2 diabetes mellitus with ketoacidosis without coma: Secondary | ICD-10-CM

## 2012-04-16 DIAGNOSIS — R4182 Altered mental status, unspecified: Secondary | ICD-10-CM

## 2012-04-16 DIAGNOSIS — D649 Anemia, unspecified: Secondary | ICD-10-CM

## 2012-04-16 DIAGNOSIS — N179 Acute kidney failure, unspecified: Secondary | ICD-10-CM

## 2012-04-16 LAB — CBC
HCT: 33.5 % — ABNORMAL LOW (ref 36.0–46.0)
Hemoglobin: 11.8 g/dL — ABNORMAL LOW (ref 12.0–15.0)
MCH: 30.6 pg (ref 26.0–34.0)
MCV: 86.8 fL (ref 78.0–100.0)
Platelets: ADEQUATE 10*3/uL (ref 150–400)
RBC: 3.86 MIL/uL — ABNORMAL LOW (ref 3.87–5.11)

## 2012-04-16 LAB — BASIC METABOLIC PANEL
BUN: 5 mg/dL — ABNORMAL LOW (ref 6–23)
CO2: 19 mEq/L (ref 19–32)
Calcium: 7.6 mg/dL — ABNORMAL LOW (ref 8.4–10.5)
Chloride: 99 mEq/L (ref 96–112)
Creatinine, Ser: 0.44 mg/dL — ABNORMAL LOW (ref 0.50–1.10)
Glucose, Bld: 234 mg/dL — ABNORMAL HIGH (ref 70–99)
Potassium: 3.6 mEq/L (ref 3.5–5.1)
Sodium: 129 mEq/L — ABNORMAL LOW (ref 135–145)

## 2012-04-16 MED ORDER — ONDANSETRON HCL 4 MG PO TABS: 8.0000 mg | ORAL_TABLET | Freq: Four times a day (QID) | ORAL | Status: DC | PRN

## 2012-04-16 MED ORDER — INSULIN ASPART 100 UNIT/ML ~~LOC~~ SOLN: 5.0000 [IU] | Freq: Three times a day (TID) | SUBCUTANEOUS | Status: DC

## 2012-04-16 MED ORDER — INSULIN GLARGINE 100 UNIT/ML ~~LOC~~ SOLN: 15.0000 [IU] | Freq: Every day | SUBCUTANEOUS | Status: DC

## 2012-04-16 MED ORDER — OXYCODONE-ACETAMINOPHEN 10-325 MG PO TABS: 1.0000 | ORAL_TABLET | ORAL | Status: DC | PRN

## 2012-04-16 NOTE — Discharge Summary (Signed)
Physician Discharge Summary  [name]:[id number] DOB: 1981-01-09 DOA: 04/15/2012  PCP: [name], MD  Admit date: 04/15/2012 Discharge date: 04/16/2012  Recommendations for Outpatient Follow-up:  1. Follow up with PCP in 1-2 weeks post discharge or sooner if symptoms persist 2. Follow up BMET, sodium level on your next scheduled appt with PCP  Discharge Diagnoses:  Principal Problem:   DKA, type 1 Active Problems:   Dehydration   Hyponatremia  Discharge Condition: medically stable for discharge home today  Diet recommendation: diabetic  History of present illness:  32 y.o. female with multiple medical problems including type 1 diabetes with infrequent DKA presented to the emergency room with abdominal pain and pain nausea and vomiting. Patient was subsequently found to have DKA with pH of 7.22 and bicarbonate of 8. Further evaluation revealed leukocytosis with a WBC of 11K , blood glucose greater than 450, creatinine of 0.91 and potassium of 4.3.   Assessment/Plan:   Principal Problem:  DKA, type 1  DKA resolved; transitioned to sub Q insulin, 5 units novolog TIDAC and lantus 15 units at bedtime Patient is on insulin pump at home, reports having supply for home Zofran prescription provided Tolerates diabetic diet Active Problems:  Hyponatremia  Secondary to dehydration  Improving with IV fluids Pt tolerates po intake and can be transitioned to normal po fluids   Code Status: full code  Family Communication: no family at bedside     Manson Passey, MD  [facility]  Pager [phone]   Consultants:  None  Procedures:  None  Antibiotics:  None    Discharge Exam: Filed Vitals:   04/16/12 0435  BP: 96/71  Pulse: 73  Temp:   Resp: 8   Filed Vitals:   04/15/12 2041 04/15/12 2315 04/16/12 0400 04/16/12 0435  BP: 95/60 96/68  96/71  Pulse: 91 84  73  Temp:  98 F (36.7 C) 97.7 F (36.5 C)   TempSrc:  Oral Oral   Resp: 16 18  8   Height:       Weight:    52 kg (114 lb 10.2 oz)  SpO2: 100% 100%  100%    General: Pt is alert, follows commands appropriately, not in acute distress Cardiovascular: Regular rate and rhythm, S1/S2 +, no murmurs, no rubs, no gallops Respiratory: Clear to auscultation bilaterally, no wheezing, no crackles, no rhonchi Abdominal: Soft, non tender, non distended, bowel sounds +, no guarding Extremities: no edema, no cyanosis, pulses palpable bilaterally DP and PT Neuro: Grossly nonfocal  Discharge Instructions  Discharge Orders   Future Orders Complete By Expires     Call MD for:  difficulty breathing, headache or visual disturbances  As directed     Call MD for:  persistant dizziness or light-headedness  As directed     Call MD for:  persistant nausea and vomiting  As directed     Call MD for:  severe uncontrolled pain  As directed     Diet - low sodium heart healthy  As directed     Increase activity slowly  As directed         Medication List    TAKE these medications       insulin aspart 100 UNIT/ML injection  Commonly known as:  novoLOG  Inject 5 Units into the skin 3 (three) times daily with meals.     insulin glargine 100 UNIT/ML injection  Commonly known as:  LANTUS  Inject 15 Units into the skin at bedtime.  insulin pump 100 unit/ml Soln  Inject 1 each into the skin continuous. Novolog in pump     ondansetron 4 MG tablet  Commonly known as:  ZOFRAN  Take 2 tablets (8 mg total) by mouth every 6 (six) hours as needed for nausea.     ondansetron 8 MG disintegrating tablet  Commonly known as:  ZOFRAN-ODT  Take 8 mg by mouth every 8 (eight) hours as needed for nausea.     oxyCODONE-acetaminophen 10-325 MG per tablet  Commonly known as:  PERCOCET  Take 1 tablet by mouth every 4 (four) hours as needed for pain.     vitamin C 500 MG tablet  Commonly known as:  ASCORBIC ACID  Take 500 mg by mouth every morning.           Follow-up Information   Follow up with  [name], MD In 2 weeks.   Contact information:   [facility] [address] [place] [address] [phone]        The results of significant diagnostics from this hospitalization (including imaging, microbiology, ancillary and laboratory) are listed below for reference.    Significant Diagnostic Studies: Dg Chest 2 View  04/15/2012  *RADIOLOGY REPORT*  Clinical Data: Nausea, abdominal pain, sore throat.  History of diabetes.  Ex-smoker.  CHEST - 2 VIEW  Comparison: 02/03/2012  Findings: The heart size and pulmonary vascularity are normal. The lungs appear clear and expanded without focal air space disease or consolidation. No blunting of the costophrenic angles.  No pneumothorax.  Mediastinal contours appear intact.  Surgical clips in the base of the neck. There are metallic piercings over the nipples. There is no significant change since previous study.  IMPRESSION: No evidence of active pulmonary disease.   Original Report Authenticated By: Burman Nieves, M.D.     Microbiology: Recent Results (from the past 240 hour(s))  RAPID STREP SCREEN     Status: None   Collection Time    04/15/12  2:09 AM      Result Value Range Status   Streptococcus, Group A Screen (Direct) NEGATIVE  NEGATIVE Final   Comment:            DUE TO INADEQUATE SENSITIVITY OF EIA     RAPID TESTS FOR GROUP A STREP (GAS)     IT IS RECOMMENDED THAT ALL NEGATIVE     RESULTS BE FOLLOWED BY A     GROUP A STREP PROBE.  MRSA PCR SCREENING     Status: None   Collection Time    04/15/12  4:00 AM      Result Value Range Status   MRSA by PCR NEGATIVE  NEGATIVE Final   Comment:            The GeneXpert MRSA Assay (FDA     approved for NASAL specimens     only), is one component of a     comprehensive MRSA colonization     surveillance program. It is not     intended to diagnose MRSA     infection nor to guide or     monitor treatment for     MRSA  infections.     Labs: Basic Metabolic Panel:  Recent Labs Lab 04/15/12 0710 04/15/12 0910 04/15/12 1106 04/15/12 1608 04/16/12 0605  NA 130* 130* 128* 134* 129*  K 3.5 3.9 3.8 3.6 3.6  CL 99 98 96 104 99  CO2 17* 14* 13* 15* 19  GLUCOSE  116* 124* 205* 152* 234*  BUN 13 13 12 10  5*  CREATININE 0.60 0.61 0.56 0.46* 0.44*  CALCIUM 7.3* 7.4* 7.5* 7.3* 7.6*   Liver Function Tests:  Recent Labs Lab 04/15/12 0023  AST 15  ALT 17  ALKPHOS 111  BILITOT 0.3  PROT 8.7*  ALBUMIN 4.3   No results found for this basename: LIPASE, AMYLASE,  in the last 168 hours No results found for this basename: AMMONIA,  in the last 168 hours CBC:  Recent Labs Lab 04/15/12 0023 04/16/12 0400  WBC 11.4* 4.5  NEUTROABS 8.3*  --   HGB 14.1 11.8*  HCT 40.5 33.5*  MCV 89.6 86.8  PLT 292 PLATELET CLUMPS NOTED ON SMEAR, COUNT APPEARS ADEQUATE   Cardiac Enzymes: No results found for this basename: CKTOTAL, CKMB, CKMBINDEX, TROPONINI,  in the last 168 hours BNP: BNP (last 3 results) No results found for this basename: PROBNP,  in the last 8760 hours CBG:  Recent Labs Lab 04/15/12 1410 04/15/12 1508 04/15/12 1610 04/15/12 1724 04/15/12 2153  GLUCAP 125* 119* 152* 178* 270*    Time coordinating discharge: Over 30 minutes  Signed:  Manson Passey, MD  [facility]  04/16/2012, 7:51 AM  Pager #: [phone]

## 2012-04-16 NOTE — Progress Notes (Signed)
IV's d/c'd intact, reviewed discharge instructions and prescriptions given to patient.  Boyfriend here to pick patientt up to go home.

## 2012-04-17 LAB — GLUCOSE, CAPILLARY

## 2012-04-27 ENCOUNTER — Inpatient Hospital Stay (HOSPITAL_COMMUNITY)
Admission: EM | Admit: 2012-04-27 | Discharge: 2012-04-30 | DRG: 637 | Disposition: A | Discharge status: Home / Self Care | Payer: Medicaid Other | Attending: Internal Medicine | Admitting: Internal Medicine

## 2012-04-27 DIAGNOSIS — K3184 Gastroparesis: Secondary | ICD-10-CM | POA: Diagnosis present

## 2012-04-27 DIAGNOSIS — Z9119 Patient's noncompliance with other medical treatment and regimen: Secondary | ICD-10-CM

## 2012-04-27 DIAGNOSIS — K219 Gastro-esophageal reflux disease without esophagitis: Secondary | ICD-10-CM | POA: Diagnosis present

## 2012-04-27 DIAGNOSIS — Z87891 Personal history of nicotine dependence: Secondary | ICD-10-CM

## 2012-04-27 DIAGNOSIS — N390 Urinary tract infection, site not specified: Secondary | ICD-10-CM

## 2012-04-27 DIAGNOSIS — E1065 Type 1 diabetes mellitus with hyperglycemia: Secondary | ICD-10-CM | POA: Diagnosis present

## 2012-04-27 DIAGNOSIS — Z91199 Patient's noncompliance with other medical treatment and regimen due to unspecified reason: Secondary | ICD-10-CM

## 2012-04-27 DIAGNOSIS — E1049 Type 1 diabetes mellitus with other diabetic neurological complication: Secondary | ICD-10-CM | POA: Diagnosis present

## 2012-04-27 DIAGNOSIS — Z886 Allergy status to analgesic agent status: Secondary | ICD-10-CM

## 2012-04-27 DIAGNOSIS — F410 Panic disorder [episodic paroxysmal anxiety] without agoraphobia: Secondary | ICD-10-CM | POA: Diagnosis present

## 2012-04-27 DIAGNOSIS — R4182 Altered mental status, unspecified: Secondary | ICD-10-CM

## 2012-04-27 DIAGNOSIS — E781 Pure hyperglyceridemia: Secondary | ICD-10-CM | POA: Diagnosis present

## 2012-04-27 DIAGNOSIS — Z23 Encounter for immunization: Secondary | ICD-10-CM

## 2012-04-27 DIAGNOSIS — D649 Anemia, unspecified: Secondary | ICD-10-CM

## 2012-04-27 DIAGNOSIS — B379 Candidiasis, unspecified: Secondary | ICD-10-CM | POA: Diagnosis present

## 2012-04-27 DIAGNOSIS — Z794 Long term (current) use of insulin: Secondary | ICD-10-CM

## 2012-04-27 DIAGNOSIS — Z79899 Other long term (current) drug therapy: Secondary | ICD-10-CM

## 2012-04-27 DIAGNOSIS — R109 Unspecified abdominal pain: Secondary | ICD-10-CM | POA: Diagnosis present

## 2012-04-27 DIAGNOSIS — N179 Acute kidney failure, unspecified: Secondary | ICD-10-CM

## 2012-04-27 DIAGNOSIS — K8689 Other specified diseases of pancreas: Secondary | ICD-10-CM | POA: Diagnosis present

## 2012-04-27 DIAGNOSIS — K7689 Other specified diseases of liver: Secondary | ICD-10-CM | POA: Diagnosis present

## 2012-04-27 DIAGNOSIS — R112 Nausea with vomiting, unspecified: Secondary | ICD-10-CM

## 2012-04-27 DIAGNOSIS — N289 Disorder of kidney and ureter, unspecified: Secondary | ICD-10-CM | POA: Diagnosis present

## 2012-04-27 DIAGNOSIS — Z885 Allergy status to narcotic agent status: Secondary | ICD-10-CM

## 2012-04-27 DIAGNOSIS — E079 Disorder of thyroid, unspecified: Secondary | ICD-10-CM | POA: Diagnosis present

## 2012-04-27 DIAGNOSIS — N12 Tubulo-interstitial nephritis, not specified as acute or chronic: Secondary | ICD-10-CM

## 2012-04-27 DIAGNOSIS — F411 Generalized anxiety disorder: Secondary | ICD-10-CM | POA: Diagnosis present

## 2012-04-27 DIAGNOSIS — E871 Hypo-osmolality and hyponatremia: Secondary | ICD-10-CM

## 2012-04-27 DIAGNOSIS — D72829 Elevated white blood cell count, unspecified: Secondary | ICD-10-CM

## 2012-04-27 DIAGNOSIS — G9341 Metabolic encephalopathy: Secondary | ICD-10-CM | POA: Diagnosis present

## 2012-04-27 DIAGNOSIS — E1010 Type 1 diabetes mellitus with ketoacidosis without coma: Secondary | ICD-10-CM

## 2012-04-27 DIAGNOSIS — E860 Dehydration: Secondary | ICD-10-CM

## 2012-04-27 LAB — COMPREHENSIVE METABOLIC PANEL
ALT: 14 U/L (ref 0–35)
Alkaline Phosphatase: 128 U/L — ABNORMAL HIGH (ref 39–117)
CO2: <7 mEq/L — CL (ref 19–32)
GFR calc Af Amer: >90 mL/min (ref >90)
Glucose, Bld: 475 mg/dL — ABNORMAL HIGH (ref 70–99)
Potassium: 4.4 mEq/L (ref 3.5–5.1)
Sodium: 129 mEq/L — ABNORMAL LOW (ref 135–145)
Total Protein: 8.4 g/dL — ABNORMAL HIGH (ref 6.0–8.3)

## 2012-04-27 LAB — GLUCOSE, CAPILLARY
Glucose-Capillary: 498 mg/dL — ABNORMAL HIGH (ref 70–99)
Glucose-Capillary: 512 mg/dL — ABNORMAL HIGH (ref 70–99)
Glucose-Capillary: 522 mg/dL — ABNORMAL HIGH (ref 70–99)

## 2012-04-27 LAB — CBC
Hemoglobin: 13.7 g/dL (ref 12.0–15.0)
MCHC: 34.3 g/dL (ref 30.0–36.0)
RBC: 4.41 MIL/uL (ref 3.87–5.11)

## 2012-04-27 LAB — BLOOD GAS, VENOUS
Bicarbonate: 4.6 mEq/L — ABNORMAL LOW (ref 20.0–24.0)
TCO2: 4.7 mmol/L (ref 0–100)
pCO2, Ven: 21.5 mmHg — ABNORMAL LOW (ref 45.0–50.0)
pH, Ven: 6.959 — CL (ref 7.250–7.300)
pO2, Ven: 44.3 mmHg (ref 30.0–45.0)

## 2012-04-27 MED ORDER — POTASSIUM CHLORIDE 10 MEQ/100ML IV SOLN
10.0000 meq | INTRAVENOUS | Status: AC
  Administered 2012-04-28: 10 meq via INTRAVENOUS
  Filled 2012-04-27: qty 100

## 2012-04-27 MED ORDER — SODIUM CHLORIDE 0.9 % IV BOLUS (SEPSIS)
1000.0000 mL | Freq: Once | INTRAVENOUS | Status: AC
  Administered 2012-04-28: 1000 mL via INTRAVENOUS

## 2012-04-27 MED ORDER — SODIUM CHLORIDE 0.9 % IV BOLUS (SEPSIS)
1000.0000 mL | Freq: Once | INTRAVENOUS | Status: AC
  Administered 2012-04-27: 1000 mL via INTRAVENOUS

## 2012-04-27 MED ORDER — HEPARIN SODIUM (PORCINE) 5000 UNIT/ML IJ SOLN
5000.0000 [IU] | Freq: Three times a day (TID) | INTRAMUSCULAR | Status: DC
  Administered 2012-04-28 – 2012-04-29 (×4): 5000 [IU] via SUBCUTANEOUS
  Filled 2012-04-27 (×10): qty 1

## 2012-04-27 MED ORDER — STERILE WATER FOR INJECTION IV SOLN
INTRAVENOUS | Status: DC
  Administered 2012-04-28 (×3): via INTRAVENOUS
  Filled 2012-04-27 (×6): qty 850

## 2012-04-27 MED ORDER — HYDROMORPHONE HCL PF 1 MG/ML IJ SOLN
0.5000 mg | Freq: Once | INTRAMUSCULAR | Status: AC
  Administered 2012-04-27: 0.5 mg via INTRAVENOUS
  Filled 2012-04-27: qty 1

## 2012-04-27 MED ORDER — MORPHINE SULFATE 4 MG/ML IJ SOLN: 4.0000 mg | Freq: Once | INTRAMUSCULAR | Status: DC

## 2012-04-27 MED ORDER — DEXTROSE-NACL 5-0.45 % IV SOLN
INTRAVENOUS | Status: DC
  Administered 2012-04-28: 03:00:00 via INTRAVENOUS

## 2012-04-27 MED ORDER — SODIUM CHLORIDE 0.9 % IV SOLN
INTRAVENOUS | Status: DC
  Administered 2012-04-27: 4.6 [IU]/h via INTRAVENOUS
  Administered 2012-04-28: 4.4 [IU]/h via INTRAVENOUS
  Administered 2012-04-28: 13:00:00 via INTRAVENOUS
  Administered 2012-04-28: 1.3 [IU]/h via INTRAVENOUS
  Administered 2012-04-28: 6.2 [IU]/h via INTRAVENOUS
  Administered 2012-04-28: 3.4 [IU]/h via INTRAVENOUS
  Administered 2012-04-28: 4.5 [IU]/h via INTRAVENOUS
  Filled 2012-04-27 (×2): qty 1

## 2012-04-27 MED ORDER — ONDANSETRON HCL 4 MG/2ML IJ SOLN
4.0000 mg | Freq: Four times a day (QID) | INTRAMUSCULAR | Status: DC | PRN
  Administered 2012-04-28 – 2012-04-29 (×3): 4 mg via INTRAVENOUS
  Filled 2012-04-27 (×3): qty 2

## 2012-04-27 MED ORDER — SODIUM CHLORIDE 0.9 % IV SOLN
INTRAVENOUS | Status: DC
  Administered 2012-04-28: 02:00:00 via INTRAVENOUS

## 2012-04-27 MED ORDER — CEFTRIAXONE SODIUM 1 G IJ SOLR
1.0000 g | INTRAMUSCULAR | Status: DC
  Administered 2012-04-28: 1 g via INTRAVENOUS
  Filled 2012-04-27 (×2): qty 10

## 2012-04-27 MED ORDER — ONDANSETRON HCL 4 MG/2ML IJ SOLN
4.0000 mg | Freq: Once | INTRAMUSCULAR | Status: AC
  Administered 2012-04-27: 4 mg via INTRAVENOUS
  Filled 2012-04-27: qty 2

## 2012-04-27 MED ORDER — SODIUM BICARBONATE 8.4 % IV SOLN
150.0000 meq | Freq: Once | INTRAVENOUS | Status: DC
  Filled 2012-04-27: qty 150

## 2012-04-27 MED ORDER — DEXTROSE 50 % IV SOLN: 25.0000 mL | INTRAVENOUS | Status: DC | PRN

## 2012-04-27 NOTE — H&P (Addendum)
[facility] History and Physical  [name] FAO:[id number] DOB: March 17, 1980 DOA: 04/27/2012  Referring physician: ED PCP: [name], MD  Specialists: None  Chief Complaint: sob, abd pain, nausea, AMS  HPI: [name] is a 32 y.o. female with a long history of DM1, poorly compliant with multiple prior episodes of DKA (7 in last year) who presents after developing n/v abd pain after her insulin pump's battery ran out this morning.  According to EDP patient gave nothing on history to point to infection as the cause and made it sound more like a compliance issue.  Unfortunately patients mental status has declined to the point where while conscious she is not able to fully answer questions by the time I have come to see her.  In the ED, patient was found to be in DKA with venous blood gas demonstrating pH of 6.95, very tachypnic with low pCO2 (compensatory).  Hospitalist has been asked to admit for DKA.  Review of Systems: Patient not really able to participate in an ROS at this time due to AMS.  Past Medical History  Diagnosis Date  . Diabetes mellitus   . Thyroid disease   . Back pain   . Panic attack   . PONV (postoperative nausea and vomiting)   . Anxiety   . GERD (gastroesophageal reflux disease)   . DM gastroparesis    Past Surgical History  Procedure Laterality Date  . Laparoscopy      age 32  . Back surgery      age 43  . Tooth extraction     Social History:  reports that she quit smoking about 12 years ago. She has quit using smokeless tobacco. She reports that  drinks alcohol. She reports that she does not use illicit drugs.   Allergies  Allergen Reactions  . Scallops (Shellfish Allergy) Anaphylaxis  . Morphine And Related Itching  . Tylenol (Acetaminophen) Itching    Family History  Problem Relation Age of Onset  . Other Mother   . Other Father     Prior to Admission medications   Medication Sig Start Date End Date Taking? Authorizing  Provider  Insulin Human (INSULIN PUMP) 100 unit/ml SOLN Inject 1 each into the skin continuous. Novolog in pump   Yes Historical Provider, MD  Multiple Vitamin (MULTIVITAMIN WITH MINERALS) TABS Take 1 tablet by mouth daily. Diabetic support vitamin   Yes Historical Provider, MD  ondansetron (ZOFRAN) 4 MG tablet Take 2 tablets (8 mg total) by mouth every 6 (six) hours as needed for nausea. 04/16/12  Yes Alison Murray, MD  oxyCODONE-acetaminophen (PERCOCET) 10-325 MG per tablet Take 1 tablet by mouth every 4 (four) hours as needed for pain. 04/16/12  Yes Alison Murray, MD  vitamin C (ASCORBIC ACID) 500 MG tablet Take 500 mg by mouth every morning.    Yes Historical Provider, MD  insulin aspart (NOVOLOG) 100 UNIT/ML injection Inject 5 Units into the skin 3 (three) times daily with meals. 04/16/12   Alison Murray, MD  insulin glargine (LANTUS) 100 UNIT/ML injection Inject 15 Units into the skin at bedtime. 04/16/12   Alison Murray, MD   Physical Exam: Filed Vitals:   04/27/12 2055 04/27/12 2200 04/27/12 2215 04/27/12 2230  BP: 150/90 139/93 143/80   Pulse: 109   116  Resp:    27  SpO2: 100%   100%    General:  Significant distress, very tachypnic Eyes: PEERLA EOMI ENT: mucous membranes dry Neck: supple  w/o JVD, no meningismus, able to touch chin to chest when asked to do so (obeying commands) Cardiovascular: tachycardic w/o MRG Respiratory: Tachypnic but CTA B Abdomen: soft, mild diffuse tenderness, nd, bs+ Skin: no rash nor lesion Musculoskeletal: MAE, full ROM all 4 extremities Psychiatric: AMS noted Neurologic: able to state she is in [place], not able to state what hospital she is in or what day of week it is  Labs on Admission:  Basic Metabolic Panel:  Recent Labs Lab 04/27/12 2130  NA 129*  K 4.4  CL 80*  CO2 <7*  GLUCOSE 475*  BUN 18  CREATININE 0.86  CALCIUM 8.8   Liver Function Tests:  Recent Labs Lab 04/27/12 2130  AST 17  ALT 14  ALKPHOS 128*  BILITOT 0.2*   PROT 8.4*  ALBUMIN 4.2   No results found for this basename: LIPASE, AMYLASE,  in the last 168 hours No results found for this basename: AMMONIA,  in the last 168 hours CBC:  Recent Labs Lab 04/27/12 2130  WBC 27.6*  HGB 13.7  HCT 40.0  MCV 90.7  PLT 362   Cardiac Enzymes: No results found for this basename: CKTOTAL, CKMB, CKMBINDEX, TROPONINI,  in the last 168 hours  BNP (last 3 results) No results found for this basename: PROBNP,  in the last 8760 hours CBG:  Recent Labs Lab 04/27/12 2148 04/27/12 2235 04/27/12 2237  GLUCAP 498* 512* 522*    Radiological Exams on Admission: No results found.  EKG: Independently reviewed.  Assessment/Plan Principal Problem:   DKA, type 1 Active Problems:   Altered mental status   Leukocytosis   1. DKA - severly acidotic with pH of 6.95, AMS now noted on exam, in addition to fluids (2nd L bolus in ED then 125 cc/hr) and of course IV insulin GTT (per protocol).  NPO for now.  Labs per protocol. 2. Leukocytosis - unclear cause, has had UTIs in the past, UA is still pending right now, Rocephin 1gm q24h ordered empirically until presence or absence of infection becomes more clear (empiric ABx being used given the acuity of the patient).  Lactic acid pending to ensure no secondary cause of acidosis. 3. AMS - due to acidosis, initiating bicarb gtt (3amps in 1L) at 75cc/hr. 4. Yeast infection - ordering fluconazole 5. Abd pain - ordering gal bladder US, though no other signs or symptoms of cholecystitis    Code Status: Full Code (must indicate code status--if unknown or must be presumed, indicate so) Family Communication: No family in room (indicate person spoken with, if applicable, with phone number if by telephone) Disposition Plan: Admit to ICU (indicate anticipated LOS)  Time spent: 70 min  GARDNER, JARED M. [facility] Pager [phone]  If 7PM-7AM, please contact night-coverage [email or web address] Password  [email or web address] 04/27/2012, 11:53 PM

## 2012-04-27 NOTE — ED Provider Notes (Signed)
History     CSN: [id number]  Arrival date & time 04/27/12  2049   First MD Initiated Contact with Patient 04/27/12 2053      Chief Complaint  Patient presents with  . Hyperglycemia    (Consider location/radiation/quality/duration/timing/severity/associated sxs/prior treatment) HPI Comments: This is a 32 year old woman who presents with SOB, abdominal pain, and nausea. She has a history of DKA and currently has a CBG of 458. Her last episode of DKA was 2 weeks ago. She states the symptoms began this morning around 9am. She took a small amount of insulin but her battery died and was unable to take her full dose. She states she is not always compliant with her medications and she is a brittle diabetic. No recent illness.  Patient is a 32 y.o. female presenting with shortness of breath. The history is provided by the patient.  Shortness of Breath Severity:  Moderate Onset quality:  Gradual Duration:  12 hours Timing:  Constant Progression:  Worsening Chronicity:  Recurrent Context comment:  DKA Relieved by:  Nothing Worsened by:  Nothing tried Ineffective treatments:  None tried Associated symptoms: abdominal pain   Associated symptoms: no vomiting     Past Medical History  Diagnosis Date  . Diabetes mellitus   . Thyroid disease   . Back pain   . Panic attack   . PONV (postoperative nausea and vomiting)   . Anxiety   . GERD (gastroesophageal reflux disease)   . DM gastroparesis     Past Surgical History  Procedure Laterality Date  . Laparoscopy      age 7  . Back surgery      age 22  . Tooth extraction      Family History  Problem Relation Age of Onset  . Other Mother   . Other Father     History  Substance Use Topics  . Smoking status: Former Smoker    Quit date: 04/19/2000  . Smokeless tobacco: Former Neurosurgeon  . Alcohol Use: Yes     Comment: social, none now 03/01/2012    OB History   Grav Para Term Preterm Abortions TAB SAB Ect Mult Living                   Review of Systems  Respiratory: Positive for shortness of breath.   Gastrointestinal: Positive for nausea and abdominal pain. Negative for vomiting and diarrhea.  All other systems reviewed and are negative.    Allergies  Scallops; Morphine and related; and Tylenol  Home Medications   Current Outpatient Rx  Name  Route  Sig  Dispense  Refill  . Insulin Human (INSULIN PUMP) 100 unit/ml SOLN   Subcutaneous   Inject 1 each into the skin continuous. Novolog in pump         . Multiple Vitamin (MULTIVITAMIN WITH MINERALS) TABS   Oral   Take 1 tablet by mouth daily. Diabetic support vitamin         . ondansetron (ZOFRAN) 4 MG tablet   Oral   Take 2 tablets (8 mg total) by mouth every 6 (six) hours as needed for nausea.   45 tablet   0   . oxyCODONE-acetaminophen (PERCOCET) 10-325 MG per tablet   Oral   Take 1 tablet by mouth every 4 (four) hours as needed for pain.   30 tablet   0   . vitamin C (ASCORBIC ACID) 500 MG tablet   Oral   Take 500 mg by mouth  every morning.          . insulin aspart (NOVOLOG) 100 UNIT/ML injection   Subcutaneous   Inject 5 Units into the skin 3 (three) times daily with meals.   1 vial   0   . insulin glargine (LANTUS) 100 UNIT/ML injection   Subcutaneous   Inject 15 Units into the skin at bedtime.   10 mL   0     BP 150/90  Pulse 109  SpO2 100%  LMP 04/09/2012  Physical Exam  Nursing note and vitals reviewed. Constitutional: She is oriented to person, place, and time. She appears well-developed and well-nourished.  Non-toxic appearance. She appears distressed.  HENT:  Head: Normocephalic and atraumatic.  Right Ear: External ear normal.  Left Ear: External ear normal.  Nose: Nose normal.  Eyes: Conjunctivae, EOM and lids are normal.  Neck: Trachea normal, normal range of motion and phonation normal. Neck supple.  Cardiovascular: Normal rate, regular rhythm, normal heart sounds and intact distal pulses.  Exam reveals  no gallop and no friction rub.   No murmur heard. Pulmonary/Chest: Breath sounds normal. No stridor. Tachypnea noted. She has no wheezes. She has no rales.  Abdominal: Soft. Normal appearance and bowel sounds are normal. She exhibits no distension. There is generalized tenderness. There is no rigidity, no rebound and no guarding.  Musculoskeletal: Normal range of motion.  Neurological: She is alert and oriented to person, place, and time.  Skin: Skin is warm and dry. She is not diaphoretic.  Psychiatric: She has a normal mood and affect.    ED Course  Procedures (including critical care time)  Labs Reviewed  CBC - Abnormal; Notable for the following:    WBC 27.6 (*)    All other components within normal limits  COMPREHENSIVE METABOLIC PANEL - Abnormal; Notable for the following:    Sodium 129 (*)    Chloride 80 (*)    CO2 <7 (*)    Glucose, Bld 475 (*)    Total Protein 8.4 (*)    Alkaline Phosphatase 128 (*)    Total Bilirubin 0.2 (*)    GFR calc non Af Amer 88 (*)    All other components within normal limits  URINALYSIS, ROUTINE W REFLEX MICROSCOPIC - Abnormal; Notable for the following:    Glucose, UA >1000 (*)    Hgb urine dipstick TRACE (*)    Ketones, ur >80 (*)    Protein, ur 30 (*)    All other components within normal limits  BLOOD GAS, VENOUS - Abnormal; Notable for the following:    pH, Ven 6.959 (*)    pCO2, Ven 21.5 (*)    Bicarbonate 4.6 (*)    Acid-base deficit 27.5 (*)    All other components within normal limits  GLUCOSE, CAPILLARY - Abnormal; Notable for the following:    Glucose-Capillary 498 (*)    All other components within normal limits  GLUCOSE, CAPILLARY - Abnormal; Notable for the following:    Glucose-Capillary 512 (*)    All other components within normal limits  GLUCOSE, CAPILLARY - Abnormal; Notable for the following:    Glucose-Capillary 522 (*)    All other components within normal limits  LACTIC ACID, PLASMA - Abnormal; Notable for the  following:    Lactic Acid, Venous 2.3 (*)    All other components within normal limits  BASIC METABOLIC PANEL - Abnormal; Notable for the following:    Sodium 131 (*)    Chloride 90 (*)  CO2 <7 (*)    Glucose, Bld 415 (*)    Calcium 7.9 (*)    GFR calc non Af Amer 88 (*)    All other components within normal limits  BASIC METABOLIC PANEL - Abnormal; Notable for the following:    Sodium 133 (*)    CO2 <7 (*)    Glucose, Bld 290 (*)    Calcium 7.3 (*)    All other components within normal limits  GLUCOSE, CAPILLARY - Abnormal; Notable for the following:    Glucose-Capillary 395 (*)    All other components within normal limits  CBC WITH DIFFERENTIAL - Abnormal; Notable for the following:    HCT 35.4 (*)    All other components within normal limits  GLUCOSE, CAPILLARY - Abnormal; Notable for the following:    Glucose-Capillary 370 (*)    All other components within normal limits  GLUCOSE, CAPILLARY - Abnormal; Notable for the following:    Glucose-Capillary 279 (*)    All other components within normal limits  GLUCOSE, CAPILLARY - Abnormal; Notable for the following:    Glucose-Capillary 209 (*)    All other components within normal limits  CULTURE, BLOOD (ROUTINE X 2)  LIPASE, BLOOD  URINE MICROSCOPIC-ADD ON  BASIC METABOLIC PANEL  BASIC METABOLIC PANEL  CBC   US Abdomen Complete  04/28/2012  *RADIOLOGY REPORT*  Clinical Data:  Elevated alkaline phosphatase.  ABDOMINAL ULTRASOUND COMPLETE  Comparison:  CT of the abdomen and pelvis performed 02/06/2012, and abdominal ultrasound performed 05/29/2007  Findings:  Gallbladder:  The gallbladder is normal in appearance, without evidence for gallstones, gallbladder wall thickening or pericholecystic fluid.  No ultrasonographic Murphy's sign is elicited.  Common Bile Duct:  0.3 cm in diameter; within normal limits in caliber.  Liver:  Normal parenchymal echogenicity and echotexture; no focal lesions identified.  Limited Doppler  evaluation demonstrates normal blood flow within the liver.  IVC:  Unremarkable in appearance.  Pancreas:  Not visualized due to overlying bowel gas.  Spleen:  10.5 cm in length; within normal limits in size and echotexture.  Right kidney:  10.8 cm in length; normal in size and configuration. Minimally increased parenchymal echogenicity is noted.  No evidence of mass or hydronephrosis.  Left kidney:  10.9 cm in length; normal in size and configuration. Minimally increased parenchymal echogenicity is noted.  No evidence of mass or hydronephrosis.  Abdominal Aorta:  Normal in caliber; no aneurysm identified.  IMPRESSION:  1.  No acute abnormality seen within the abdomen. 2.  Minimally increased renal parenchymal echogenicity may reflect medical renal disease.   Original Report Authenticated By: Tonia Ghent, M.D.    Dg Chest Port 1 View  04/28/2012  *RADIOLOGY REPORT*  Clinical Data: Abdominal pain, diabetes  PORTABLE CHEST - 1 VIEW  Comparison: Plain film 04/15/2012  Findings: Normal mediastinum and cardiac silhouette.  Normal pulmonary  vasculature.  No evidence of effusion, infiltrate, or pneumothorax.  No acute bony abnormality.  Vascular clips noted in the thyroid bed.  IMPRESSION: No acute cardiopulmonary process.   Original Report Authenticated By: Genevive Bi, M.D.      1. Leukocytosis   2. Altered mental status   3. DKA, type 1       MDM  Patient presented today in DKA. Last admission for DKA was 2 weeks ago. VBG pH of 6.9. Anion gap 44. WBC 27.6. Covered with abx. Patient began to decompensate after glucose stabilizer started. Central line placed. Admitted to hospitalist service.  Mora Bellman, PA-C 04/28/12 0532

## 2012-04-27 NOTE — ED Notes (Signed)
[id number]<YQ> Expected date:<BR> Expected time:<BR> Means of arrival:<BR> Comments:<BR> EMS/female abd pain-blood sugar &gt;40-RR&gt;30-hx DKA/tach 120

## 2012-04-27 NOTE — ED Notes (Signed)
CO2 7

## 2012-04-27 NOTE — ED Notes (Signed)
Patient unable to void 

## 2012-04-27 NOTE — ED Notes (Addendum)
Second attempt to collect urine, patient states that she can't.

## 2012-04-27 NOTE — ED Notes (Signed)
Informed PA about results.

## 2012-04-27 NOTE — ED Notes (Signed)
Pt c/o hyperglycemia. CBG 458. DKA 2 weeks prior. Pt states she feels like that now. RR 32 Diffuse abdominal pain, nausea. 8 zofran given en route via 20 gauge R AC. 3-400 cc of fluids en route. Pt took insulin this AM, states she's inconsistent with insulin. Initial VS 118/78 HR 120 RR 32 spO2 100 % 2L.

## 2012-04-28 ENCOUNTER — Inpatient Hospital Stay (HOSPITAL_COMMUNITY): Payer: Medicaid Other

## 2012-04-28 ENCOUNTER — Encounter (HOSPITAL_COMMUNITY): Payer: Self-pay | Admitting: *Deleted

## 2012-04-28 DIAGNOSIS — E871 Hypo-osmolality and hyponatremia: Secondary | ICD-10-CM

## 2012-04-28 DIAGNOSIS — G9341 Metabolic encephalopathy: Secondary | ICD-10-CM

## 2012-04-28 LAB — GLUCOSE, CAPILLARY
Glucose-Capillary: 103 mg/dL — ABNORMAL HIGH (ref 70–99)
Glucose-Capillary: 107 mg/dL — ABNORMAL HIGH (ref 70–99)
Glucose-Capillary: 113 mg/dL — ABNORMAL HIGH (ref 70–99)
Glucose-Capillary: 125 mg/dL — ABNORMAL HIGH (ref 70–99)
Glucose-Capillary: 135 mg/dL — ABNORMAL HIGH (ref 70–99)
Glucose-Capillary: 325 mg/dL — ABNORMAL HIGH (ref 70–99)
Glucose-Capillary: 340 mg/dL — ABNORMAL HIGH (ref 70–99)
Glucose-Capillary: 357 mg/dL — ABNORMAL HIGH (ref 70–99)
Glucose-Capillary: 395 mg/dL — ABNORMAL HIGH (ref 70–99)
Glucose-Capillary: 97 mg/dL (ref 70–99)

## 2012-04-28 LAB — CBC
Hemoglobin: 10.4 g/dL — ABNORMAL LOW (ref 12.0–15.0)
MCH: 31 pg (ref 26.0–34.0)
MCV: 89.3 fL (ref 78.0–100.0)
RBC: 3.35 MIL/uL — ABNORMAL LOW (ref 3.87–5.11)

## 2012-04-28 LAB — BASIC METABOLIC PANEL
BUN: 16 mg/dL (ref 6–23)
BUN: 5 mg/dL — ABNORMAL LOW (ref 6–23)
BUN: 5 mg/dL — ABNORMAL LOW (ref 6–23)
BUN: 7 mg/dL (ref 6–23)
BUN: 9 mg/dL (ref 6–23)
CO2: 21 mEq/L (ref 19–32)
CO2: 8 mEq/L — CL (ref 19–32)
CO2: 9 mEq/L — CL (ref 19–32)
CO2: <7 mEq/L — CL (ref 19–32)
CO2: <7 mEq/L — CL (ref 19–32)
Calcium: 7.2 mg/dL — ABNORMAL LOW (ref 8.4–10.5)
Calcium: 7 mg/dL — ABNORMAL LOW (ref 8.4–10.5)
Calcium: 7.5 mg/dL — ABNORMAL LOW (ref 8.4–10.5)
Chloride: 100 mEq/L (ref 96–112)
Chloride: 90 mEq/L — ABNORMAL LOW (ref 96–112)
Chloride: 97 mEq/L (ref 96–112)
Creatinine, Ser: 0.82 mg/dL (ref 0.50–1.10)
Creatinine, Ser: 0.67 mg/dL (ref 0.50–1.10)
Creatinine, Ser: 0.71 mg/dL (ref 0.50–1.10)
Creatinine, Ser: 0.55 mg/dL (ref 0.50–1.10)
GFR calc Af Amer: >90 mL/min (ref >90)
GFR calc Af Amer: >90 mL/min (ref >90)
GFR calc non Af Amer: >90 mL/min (ref >90)
GFR calc non Af Amer: >90 mL/min (ref >90)
Glucose, Bld: 415 mg/dL — ABNORMAL HIGH (ref 70–99)
Glucose, Bld: 102 mg/dL — ABNORMAL HIGH (ref 70–99)
Glucose, Bld: 143 mg/dL — ABNORMAL HIGH (ref 70–99)
Glucose, Bld: 375 mg/dL — ABNORMAL HIGH (ref 70–99)
Potassium: 4.3 mEq/L (ref 3.5–5.1)
Sodium: 133 mEq/L — ABNORMAL LOW (ref 135–145)

## 2012-04-28 LAB — RAPID URINE DRUG SCREEN, HOSP PERFORMED
Amphetamines: NOT DETECTED
Barbiturates: NOT DETECTED
Benzodiazepines: NOT DETECTED
Cocaine: NOT DETECTED
Tetrahydrocannabinol: NOT DETECTED

## 2012-04-28 LAB — CBC WITH DIFFERENTIAL/PLATELET
Eosinophils Relative: 0 % (ref 0–5)
Monocytes Relative: 5 % (ref 3–12)
Neutrophils Relative %: 88 % — ABNORMAL HIGH (ref 43–77)
Platelets: 298 10*3/uL (ref 150–400)
RBC: 3.88 MIL/uL (ref 3.87–5.11)
WBC Morphology: INCREASED
WBC: 23.6 10*3/uL — ABNORMAL HIGH (ref 4.0–10.5)

## 2012-04-28 LAB — URINALYSIS, ROUTINE W REFLEX MICROSCOPIC
Bilirubin Urine: NEGATIVE
Glucose, UA: >1000 mg/dL — AB
Ketones, ur: >80 mg/dL — AB
Nitrite: NEGATIVE
pH: 5 (ref 5.0–8.0)

## 2012-04-28 LAB — KETONES, QUALITATIVE

## 2012-04-28 LAB — MAGNESIUM: Magnesium: 1.4 mg/dL — ABNORMAL LOW (ref 1.5–2.5)

## 2012-04-28 LAB — URINE MICROSCOPIC-ADD ON

## 2012-04-28 LAB — LIPID PANEL
LDL Cholesterol: UNDETERMINED mg/dL (ref 0–99)
Total CHOL/HDL Ratio: 5.4 RATIO

## 2012-04-28 MED ORDER — DEXTROSE 5 % IV SOLN
1.0000 g | Freq: Every day | INTRAVENOUS | Status: DC
  Administered 2012-04-28: 1 g via INTRAVENOUS
  Filled 2012-04-28: qty 10

## 2012-04-28 MED ORDER — HYDROMORPHONE HCL PF 1 MG/ML IJ SOLN
0.5000 mg | INTRAMUSCULAR | Status: DC | PRN
  Administered 2012-04-28 – 2012-04-30 (×10): 0.5 mg via INTRAVENOUS
  Filled 2012-04-28 (×11): qty 1

## 2012-04-28 MED ORDER — MICONAZOLE NITRATE POWD
Freq: Two times a day (BID) | Status: DC
  Administered 2012-04-28 – 2012-04-29 (×3): via TOPICAL

## 2012-04-28 MED ORDER — DEXTROSE 5 % IV SOLN
1.0000 g | INTRAVENOUS | Status: DC
  Filled 2012-04-28: qty 10

## 2012-04-28 MED ORDER — MAGNESIUM SULFATE 40 MG/ML IJ SOLN
2.0000 g | Freq: Once | INTRAMUSCULAR | Status: AC
  Administered 2012-04-28: 2 g via INTRAVENOUS
  Filled 2012-04-28: qty 50

## 2012-04-28 MED ORDER — POTASSIUM CHLORIDE 2 MEQ/ML IV SOLN
INTRAVENOUS | Status: DC
  Administered 2012-04-28 – 2012-04-30 (×5): via INTRAVENOUS
  Filled 2012-04-28 (×8): qty 1000

## 2012-04-28 MED ORDER — POTASSIUM CHLORIDE 10 MEQ/100ML IV SOLN
INTRAVENOUS | Status: AC
  Administered 2012-04-28: 10 meq
  Filled 2012-04-28: qty 100

## 2012-04-28 NOTE — ED Notes (Signed)
US at bedside

## 2012-04-28 NOTE — ED Notes (Signed)
Blood culture x2 canceled by admitting Dr.

## 2012-04-28 NOTE — Progress Notes (Signed)
Inpatient Diabetes Program Recommendations  AACE/ADA: New Consensus Statement on Inpatient Glycemic Control (2013)  Target Ranges:  Prepandial:   less than 140 mg/dL      Peak postprandial:   less than 180 mg/dL (1-2 hours)      Critically ill patients:  140 - 180 mg/dL    Admitted with DKA.  Patient has had several admissions for DKA within the past year per admitting physician's note.  Back in December, Diabetes Coordinator worked closely with the physician and patient to get patient on a SQ regimen and to have close follow-up with Dr. [name] (endocrinologist).  Per notes, patient did not follow-up with Dr. [name] (missed appointment).  Noted Dr. Don Perking note outlining a plan to d/c patient home on SQ insulin instead of her insulin pump b/c patient has a very poor understanding of her insulin pump.  Agree.  Noted patient remains on IV insulin drip.  Taking PO diet.  Called RN to remind RN to cover patient's carbohydrate intake with the IV insulin drip per the GlucoStabilizer directions.  Patient's Anion gap still high (23) with CO2 of 9 on 9am BMET.    Will follow. Ambrose Finland RN, MSN, CDE Diabetes Coordinator Inpatient Diabetes Program [phone]

## 2012-04-28 NOTE — Progress Notes (Signed)
Nutrition Brief Note  Patient identified on the Malnutrition Screening Tool (MST) Report  Body mass index is 20.12 kg/(m^2). Patient meets criteria for Normal based on current BMI.   Current diet order is Carb Modified, patient is consuming approximately 50% of meals at this time.  Pt reports having a good appetite but, having difficulty swallowing breakfast at time of visit due to coughing, SOB, and reported reflux. Pt had coffee and orange juice at breakfast making reflux worse and swallowing more difficult- recommended avoiding certain foods/beverages during this time.  Pt states that her usual body weight is between 105 and 110 lbs. Pt reports having a good appetite and eating 4-6 meals daily PTA. Pt weighed 117 lbs today; no wt loss. Pt states that she has no questions about following a diabetic diet or controlling blood glucose; pt states she knows what to eat and how to control blood glucose. Her only concern is getting her insulin pump working.  Labs and medications reviewed. Will monitor for po intake and swallowing improvement. No nutrition interventions warranted at this time. If nutrition issues arise, please consult RD.   Ian Malkin RD, LDN Inpatient Clinical Dietitian Pager: [phone] After Hours Pager: [phone]

## 2012-04-28 NOTE — ED Provider Notes (Signed)
Pt with hx of DKA comes in with cc of elevated sugar. Pt seen by our mid level provider Katie S. And she is managing the patient. Pt is noted to be in prety pronounced DKA, with pH < 7, AG more than 40. She had some AMS - and only 1 iv access, so i placed a femoral central line. She has leukocytosis and elevated alk phos. All the workup thus far is neg for possible etiology of the DKA- but it is believed to be non compliance.  We will give her ceftriaxone for possible infection. IV hydration started. Insulin started.   CENTRAL LINE Performed by: Derwood Kaplan Consent: The procedure was performed in an emergent situation. Required items: required blood products, implants, devices, and special equipment available Patient identity confirmed: arm band and provided demographic data Time out: Immediately prior to procedure a "time out" was called to verify the correct patient, procedure, equipment, support staff and site/side marked as required. Indications: vascular access Anesthesia: local infiltration Local anesthetic: lidocaine 1% with epinephrine Anesthetic total: 3 ml Patient sedated: no Preparation: skin prepped with 2% chlorhexidine Skin prep agent dried: skin prep agent completely dried prior to procedure Sterile barriers: all five maximum sterile barriers used - cap, mask, sterile gown, sterile gloves, and large sterile sheet Hand hygiene: hand hygiene performed prior to central venous catheter insertion  Location details: Left femoral  Catheter type: triple lumen Catheter size: 8 Fr Pre-procedure: landmarks identified Ultrasound guidance: yes Successful placement: yes Post-procedure: line sutured and dressing applied Assessment: blood return through all parts, free fluid flow, placement verified by x-ray and no pneumothorax on x-ray Patient tolerance: Patient tolerated the procedure well with no immediate complications.    CRITICAL CARE Performed by: Derwood Kaplan   Total critical care time: 60 minutes  Critical care time was exclusive of separately billable procedures and treating other patients.  Critical care was necessary to treat or prevent imminent or life-threatening deterioration.  Critical care was time spent personally by me on the following activities: development of treatment plan with patient and/or surrogate as well as nursing, discussions with consultants, evaluation of patient's response to treatment, examination of patient, obtaining history from patient or surrogate, ordering and performing treatments and interventions, ordering and review of laboratory studies, ordering and review of radiographic studies, pulse oximetry and re-evaluation of patient's condition.   Derwood Kaplan, MD 04/28/12 [phone]

## 2012-04-28 NOTE — Progress Notes (Signed)
[facility] PROGRESS NOTE  CELISE BAZAR ZOX:[id number] DOB: 03/07/1980 DOA: 04/27/2012 PCP: [name], MD  Brief Hx 32 year old female with history of diabetes mellitus type 1 presented with nausea shortness of breath, abdominal pain. The patient states that the battery on her insulin pump died approximately 24 hours prior to admission. She was not able to find new lithium She tried alkaline dilated without any success. Patient states that she has had insulin pump for approximately 3 years, but she has not followed up with a physician or approximately one year. She states that she is having some problems with her Medicaid. She missed her scheduled appointment in January with Dr. Derald Macleod. Since her discharge on 04/16/2012 the patient started herself back on insulin pump. This morning, she states that her shortness of breath has significantly improved and abdominal pain 25-50% better Assessment/Plan: Diabetic ketoacidosis -History of poor followup and noncompliance -Patient seems to have little knowledge regarding her insulin pump -She does not know any of her settings, nor does she know her CHO ratio -Had long discussion with the patient--due to her poor insight regarding her insulin pump and poor followup with her outpatient physicians, the patient is agreeable to going home on subcutaneous insulin until she is able to follow up with her endocrinologist and primary care physician -Continue insulin drip -Continue bicarbonate drip -BMPs every 4 hours Abdominal pain -Abdominal ultrasound was negative -LFTs normal, lipase 38 -Patient states 25-50% better -Urine pregnancy test -Urine drug screen Leukocytosis -Follow blood cultures -Urinalysis did not suggest UTI--no pyuria present -Chest x-ray is negative Metabolic acidosis -Patient has components of gapped and non-gap metabolic acidosis Acute encephalopathy -Improved from yesterday -Check ammonia -cycle Troponins as  patient is complaining of chest discomfort -EKG -Urine drug screen is discussed Principal Problem:   DKA, type 1 Active Problems:   Altered mental status   Leukocytosis    Family Communication:   Pt at beside Disposition Plan:   Home when medically stable Total time with patient: 65 min   Antibiotics:  Ceftriaxone 04/28/2012>>>    Procedures/Studies: Dg Chest 2 View  04/15/2012  *RADIOLOGY REPORT*  Clinical Data: Nausea, abdominal pain, sore throat.  History of diabetes.  Ex-smoker.  CHEST - 2 VIEW  Comparison: 02/03/2012  Findings: The heart size and pulmonary vascularity are normal. The lungs appear clear and expanded without focal air space disease or consolidation. No blunting of the costophrenic angles.  No pneumothorax.  Mediastinal contours appear intact.  Surgical clips in the base of the neck. There are metallic piercings over the nipples. There is no significant change since previous study.  IMPRESSION: No evidence of active pulmonary disease.   Original Report Authenticated By: Burman Nieves, M.D.    US Abdomen Complete  04/28/2012  *RADIOLOGY REPORT*  Clinical Data:  Elevated alkaline phosphatase.  ABDOMINAL ULTRASOUND COMPLETE  Comparison:  CT of the abdomen and pelvis performed 02/06/2012, and abdominal ultrasound performed 05/29/2007  Findings:  Gallbladder:  The gallbladder is normal in appearance, without evidence for gallstones, gallbladder wall thickening or pericholecystic fluid.  No ultrasonographic Murphy's sign is elicited.  Common Bile Duct:  0.3 cm in diameter; within normal limits in caliber.  Liver:  Normal parenchymal echogenicity and echotexture; no focal lesions identified.  Limited Doppler evaluation demonstrates normal blood flow within the liver.  IVC:  Unremarkable in appearance.  Pancreas:  Not visualized due to overlying bowel gas.  Spleen:  10.5 cm in length; within normal limits in size and echotexture.  Right kidney:  10.8 cm in length; normal in  size and configuration. Minimally increased parenchymal echogenicity is noted.  No evidence of mass or hydronephrosis.  Left kidney:  10.9 cm in length; normal in size and configuration. Minimally increased parenchymal echogenicity is noted.  No evidence of mass or hydronephrosis.  Abdominal Aorta:  Normal in caliber; no aneurysm identified.  IMPRESSION:  1.  No acute abnormality seen within the abdomen. 2.  Minimally increased renal parenchymal echogenicity may reflect medical renal disease.   Original Report Authenticated By: Tonia Ghent, M.D.    Dg Chest Port 1 View  04/28/2012  *RADIOLOGY REPORT*  Clinical Data: Abdominal pain, diabetes  PORTABLE CHEST - 1 VIEW  Comparison: Plain film 04/15/2012  Findings: Normal mediastinum and cardiac silhouette.  Normal pulmonary  vasculature.  No evidence of effusion, infiltrate, or pneumothorax.  No acute bony abnormality.  Vascular clips noted in the thyroid bed.  IMPRESSION: No acute cardiopulmonary process.   Original Report Authenticated By: Genevive Bi, M.D.          Subjective: Patient states her abdominal pain is 25-50% better. She denies any shortness of breath but complains of some chest discomfort. No dizziness, headache, vomiting, diarrhea. She did have some dysuria prior to coming to the hospital. Denies fevers, chills, coughing, hemoptysis, vaginal bleeding.  Objective: Filed Vitals:   04/28/12 0500 04/28/12 0510 04/28/12 0600 04/28/12 0700  BP:   95/53 89/50  Pulse: 104 108 100 99  Temp: 99.3 F (37.4 C) 99.3 F (37.4 C) 99.1 F (37.3 C) 98.8 F (37.1 C)  TempSrc:    Core (Comment)  Resp: 14 13 14 11   Height:      Weight:      SpO2: 100% 100% 100% 100%    Intake/Output Summary (Last 24 hours) at 04/28/12 0802 Last data filed at 04/28/12 0700  Gross per 24 hour  Intake   1275 ml  Output   1200 ml  Net     75 ml   Weight change:  Exam:   General:  Pt is alert, follows commands appropriately, not in acute  distress  HEENT: No icterus, No thrush, No neck mass, [place]/AT  Cardiovascular: RRR, S1/S2, no rubs, no gallops  Respiratory: CTA bilaterally, no wheezing, no crackles, no rhonchi  Abdomen: Soft/+BS, nontender to palpation left lower quadrant, right lower quadrant, non distended, no guarding; no peritoneal signs  Extremities: No edema, No lymphangitis, No petechiae, No rashes, no synovitis  Data Reviewed: Basic Metabolic Panel:  Recent Labs Lab 04/27/12 2130 04/28/12 0013 04/28/12 0145 04/28/12 0538  NA 129* 131* 133* 133*  K 4.4 4.3 4.0 3.5  CL 80* 90* 97 101  CO2 <7* <7* <7* 8*  GLUCOSE 475* 415* 290* 102*  BUN 18 16 15 11   CREATININE 0.86 0.86 0.82 0.73  CALCIUM 8.8 7.9* 7.3* 7.2*   Liver Function Tests:  Recent Labs Lab 04/27/12 2130  AST 17  ALT 14  ALKPHOS 128*  BILITOT 0.2*  PROT 8.4*  ALBUMIN 4.2    Recent Labs Lab 04/28/12 0145  LIPASE 38   No results found for this basename: AMMONIA,  in the last 168 hours CBC:  Recent Labs Lab 04/27/12 2130 04/28/12 0145 04/28/12 0538  WBC 27.6* 23.6* 17.0*  NEUTROABS  --  20.7*  --   HGB 13.7 12.0 10.4*  HCT 40.0 35.4* 29.9*  MCV 90.7 91.2 89.3  PLT 362 298 202   Cardiac Enzymes: No results found for this basename: CKTOTAL, CKMB, CKMBINDEX, TROPONINI,  in the last 168 hours BNP: No components found with this basename: POCBNP,  CBG:  Recent Labs Lab 04/28/12 0300 04/28/12 0418 04/28/12 0454 04/28/12 0559 04/28/12 0700  GLUCAP 209* 125* 113* 102* 109*    Recent Results (from the past 240 hour(s))  MRSA PCR SCREENING     Status: None   Collection Time    04/28/12  4:55 AM      Result Value Range Status   MRSA by PCR NEGATIVE  NEGATIVE Final   Comment:            The GeneXpert MRSA Assay (FDA     approved for NASAL specimens     only), is one component of a     comprehensive MRSA colonization     surveillance program. It is not     intended to diagnose MRSA     infection nor to guide or      monitor treatment for     MRSA infections.     Scheduled Meds: . cefTRIAXone (ROCEPHIN)  IV  1 g Intravenous QHS  . heparin  5,000 Units Subcutaneous Q8H  . miconazole nitrate   Topical BID   Continuous Infusions: . sodium chloride 125 mL/hr at 04/28/12 0223  . dextrose 5 % and 0.45% NaCl 1,000 mL with potassium chloride 20 mEq infusion    . insulin (NOVOLIN-R) infusion Stopped (04/28/12 0600)  .  sodium bicarbonate 150 mEq in sterile water 1000 mL infusion 100 mL/hr at 04/28/12 0037     Alizandra Loh, DO  [facility] Pager [phone]  If 7PM-7AM, please contact night-coverage [email or web address] Password [email or web address] 04/28/2012, 8:02 AM   LOS: 1 day

## 2012-04-28 NOTE — ED Notes (Signed)
Informed Dr. Julian Reil about pt's CO2 <7.

## 2012-04-28 NOTE — Progress Notes (Signed)
AMS significantly improved at this time, reducing bicarb gtt rate to 75 cc/hr and allowing patient to be admitted to stepdown instead of ICU.

## 2012-04-29 ENCOUNTER — Inpatient Hospital Stay (HOSPITAL_COMMUNITY): Payer: Medicaid Other

## 2012-04-29 ENCOUNTER — Encounter (HOSPITAL_COMMUNITY): Payer: Self-pay | Admitting: Radiology

## 2012-04-29 DIAGNOSIS — R112 Nausea with vomiting, unspecified: Secondary | ICD-10-CM

## 2012-04-29 LAB — CBC WITH DIFFERENTIAL/PLATELET
Basophils Relative: 0 % (ref 0–1)
Eosinophils Absolute: 0.1 10*3/uL (ref 0.0–0.7)
Hemoglobin: 9.7 g/dL — ABNORMAL LOW (ref 12.0–15.0)
MCH: 30.8 pg (ref 26.0–34.0)
MCHC: 35.5 g/dL (ref 30.0–36.0)
Monocytes Relative: 4 % (ref 3–12)
Neutrophils Relative %: 70 % (ref 43–77)
Platelets: 125 10*3/uL — ABNORMAL LOW (ref 150–400)
RDW: 15.2 % (ref 11.5–15.5)

## 2012-04-29 LAB — BASIC METABOLIC PANEL
BUN: 4 mg/dL — ABNORMAL LOW (ref 6–23)
GFR calc non Af Amer: >90 mL/min (ref >90)
Glucose, Bld: 179 mg/dL — ABNORMAL HIGH (ref 70–99)
Potassium: 3.7 mEq/L (ref 3.5–5.1)

## 2012-04-29 LAB — TROPONIN I: Troponin I: <0.3 ng/mL (ref <0.30)

## 2012-04-29 LAB — GLUCOSE, CAPILLARY: Glucose-Capillary: 196 mg/dL — ABNORMAL HIGH (ref 70–99)

## 2012-04-29 MED ORDER — INSULIN ASPART 100 UNIT/ML ~~LOC~~ SOLN
0.0000 [IU] | Freq: Three times a day (TID) | SUBCUTANEOUS | Status: DC
  Administered 2012-04-29: 7 [IU] via SUBCUTANEOUS
  Administered 2012-04-29 – 2012-04-30 (×3): 2 [IU] via SUBCUTANEOUS
  Administered 2012-04-30: 3 [IU] via SUBCUTANEOUS

## 2012-04-29 MED ORDER — PROMETHAZINE HCL 25 MG/ML IJ SOLN
12.5000 mg | Freq: Four times a day (QID) | INTRAMUSCULAR | Status: DC | PRN
  Administered 2012-04-29 – 2012-04-30 (×3): 12.5 mg via INTRAVENOUS
  Filled 2012-04-29 (×3): qty 1

## 2012-04-29 MED ORDER — INSULIN GLARGINE 100 UNIT/ML ~~LOC~~ SOLN
15.0000 [IU] | Freq: Every day | SUBCUTANEOUS | Status: DC
  Administered 2012-04-29: 15 [IU] via SUBCUTANEOUS

## 2012-04-29 MED ORDER — IOHEXOL 300 MG/ML  SOLN
25.0000 mL | Freq: Once | INTRAMUSCULAR | Status: AC | PRN
  Administered 2012-04-29: 25 mL via ORAL

## 2012-04-29 MED ORDER — POTASSIUM CHLORIDE 10 MEQ/100ML IV SOLN
10.0000 meq | INTRAVENOUS | Status: AC
  Administered 2012-04-29 (×4): 10 meq via INTRAVENOUS
  Filled 2012-04-29: qty 400

## 2012-04-29 MED ORDER — IOHEXOL 300 MG/ML  SOLN
25.0000 mL | Freq: Once | INTRAMUSCULAR | Status: AC | PRN
  Administered 2012-04-29 (×2): 25 mL via ORAL

## 2012-04-29 MED ORDER — INSULIN ASPART 100 UNIT/ML ~~LOC~~ SOLN
0.0000 [IU] | Freq: Every day | SUBCUTANEOUS | Status: DC
  Administered 2012-04-29: 4 [IU] via SUBCUTANEOUS

## 2012-04-29 MED ORDER — IOHEXOL 300 MG/ML  SOLN
100.0000 mL | Freq: Once | INTRAMUSCULAR | Status: AC | PRN
  Administered 2012-04-29: 100 mL via INTRAVENOUS

## 2012-04-29 MED ORDER — FENOFIBRATE 160 MG PO TABS
160.0000 mg | ORAL_TABLET | Freq: Every day | ORAL | Status: DC
  Administered 2012-04-29 – 2012-04-30 (×2): 160 mg via ORAL
  Filled 2012-04-29 (×2): qty 1

## 2012-04-29 NOTE — Progress Notes (Signed)
[facility] PROGRESS NOTE  [name]:[id number] DOB: [birth year] DOA: 04/27/2012 PCP: [name], MD Brief Hx  32 year old female with history of diabetes mellitus type 1 presented with nausea shortness of breath, abdominal pain. The patient states that the battery on her insulin pump died approximately 24 hours prior to admission. She was not able to find new lithium She tried alkaline batteries without any success. Patient states that she has had insulin pump for approximately 3 years, but she has not followed up with a physician or approximately one year. She states that she is having some problems with her Medicaid. She missed her scheduled appointment in January with Dr. Derald Macleod. Since her discharge on 04/16/2012, the patient started herself back on insulin pump. This morning, she states that her shortness of breath has significantly improved and abdominal pain is 50% better but persists.   Assessment/Plan:  Diabetic ketoacidosis  -History of poor followup and noncompliance  -Patient seems to have little knowledge regarding her insulin pump  -She does not know any of her settings, nor does she know her CHO ratio  -Had long discussion with the patient--due to her poor insight regarding her insulin pump and poor followup with her outpatient physicians, the patient is agreeable to going home on subcutaneous insulin until she is able to follow up with her endocrinologist and primary care physician  -Anion gap is closed--started on Lantus--first dose 04/29/2012 at0130 -Discontinue bicarbonate drip  -Discontinue BMPs every 4 hours  -Repeat BMP, magnesium in the morning -Transferred to telemetry Abdominal pain  -Abdominal ultrasound was negative  -LFTs normal, lipase 38  -Patient states 25-50% better  -Urine pregnancy test--negative -Urine drug screen--negative -CT abdomen and pelvis due to persistent pain Leukocytosis  -Follow blood cultures--negative to  date -Urinalysis did not suggest UTI--no pyuria present  -Chest x-ray is negative  Metabolic acidosis  -Patient has components of gapped and non-gap metabolic acidosis  -Gap is closed Acute encephalopathy  -Improved  -cycle Troponins as patient is complaining of chest discomfort -EKG  -Urine drug screen--negative  Family Communication: Pt at beside  Disposition Plan: Home when medically stable   Antibiotics:  Ceftriaxone 04/28/2012>>> 04/29/2012           Procedures/Studies: Dg Chest 2 View  04/15/2012  *RADIOLOGY REPORT*  Clinical Data: Nausea, abdominal pain, sore throat.  History of diabetes.  Ex-smoker.  CHEST - 2 VIEW  Comparison: 02/03/2012  Findings: The heart size and pulmonary vascularity are normal. The lungs appear clear and expanded without focal air space disease or consolidation. No blunting of the costophrenic angles.  No pneumothorax.  Mediastinal contours appear intact.  Surgical clips in the base of the neck. There are metallic piercings over the nipples. There is no significant change since previous study.  IMPRESSION: No evidence of active pulmonary disease.   Original Report Authenticated By: Burman Nieves, M.D.    US Abdomen Complete  04/28/2012  *RADIOLOGY REPORT*  Clinical Data:  Elevated alkaline phosphatase.  ABDOMINAL ULTRASOUND COMPLETE  Comparison:  CT of the abdomen and pelvis performed 02/06/2012, and abdominal ultrasound performed 05/29/2007  Findings:  Gallbladder:  The gallbladder is normal in appearance, without evidence for gallstones, gallbladder wall thickening or pericholecystic fluid.  No ultrasonographic Murphy's sign is elicited.  Common Bile Duct:  0.3 cm in diameter; within normal limits in caliber.  Liver:  Normal parenchymal echogenicity and echotexture; no focal lesions identified.  Limited Doppler evaluation demonstrates normal blood flow within the liver.  IVC:  Unremarkable in appearance.  Pancreas:  Not visualized due to overlying  bowel gas.  Spleen:  10.5 cm in length; within normal limits in size and echotexture.  Right kidney:  10.8 cm in length; normal in size and configuration. Minimally increased parenchymal echogenicity is noted.  No evidence of mass or hydronephrosis.  Left kidney:  10.9 cm in length; normal in size and configuration. Minimally increased parenchymal echogenicity is noted.  No evidence of mass or hydronephrosis.  Abdominal Aorta:  Normal in caliber; no aneurysm identified.  IMPRESSION:  1.  No acute abnormality seen within the abdomen. 2.  Minimally increased renal parenchymal echogenicity may reflect medical renal disease.   Original Report Authenticated By: Tonia Ghent, M.D.    Dg Chest Port 1 View  04/28/2012  *RADIOLOGY REPORT*  Clinical Data: Abdominal pain, diabetes  PORTABLE CHEST - 1 VIEW  Comparison: Plain film 04/15/2012  Findings: Normal mediastinum and cardiac silhouette.  Normal pulmonary  vasculature.  No evidence of effusion, infiltrate, or pneumothorax.  No acute bony abnormality.  Vascular clips noted in the thyroid bed.  IMPRESSION: No acute cardiopulmonary process.   Original Report Authenticated By: Genevive Bi, M.D.          Subjective: Patient states that she still having some chest discomfort. She denies any shortness of breath, vomiting, diarrhea, she still complains of abdominal pain although it is somewhat improved. She denies any headache, dizziness, rashes, fevers, chills  Objective: Filed Vitals:   04/29/12 0200 04/29/12 0400 04/29/12 0600 04/29/12 0700  BP: 111/64 95/61 100/63   Pulse: 97 89 92 85  Temp: 98.8 F (37.1 C) 99 F (37.2 C) 98.8 F (37.1 C) 98.6 F (37 C)  TempSrc:  Core (Comment)    Resp: 22 11 11 8   Height:      Weight:  53.2 kg (117 lb 4.6 oz)    SpO2: 98% 97% 98% 98%    Intake/Output Summary (Last 24 hours) at 04/29/12 0803 Last data filed at 04/29/12 0600  Gross per 24 hour  Intake 7257.67 ml  Output   4900 ml  Net 2357.67 ml    Weight change: 0 kg (0 lb) Exam:   General:  Pt is alert, follows commands appropriately, not in acute distress  HEENT: No icterus, No thrush,  [place]/AT  Cardiovascular: RRR, S1/S2, no rubs, no gallops  Respiratory: CTA bilaterally, no wheezing, no crackles, no rhonchi  Abdomen: Soft/+BS, epigastric and left upper quadrant discomfort, non distended, no guarding; no peritoneal signs  Extremities: No edema, No lymphangitis, No petechiae, No rashes, no synovitis  Data Reviewed: Basic Metabolic Panel:  Recent Labs Lab 04/28/12 0900 04/28/12 1438 04/28/12 1759 04/28/12 2220 04/29/12 0349  NA 132* 128* 130* 132* 133*  K 3.5 3.7 3.0* 2.9* 3.7  CL 100 95* 97 100 101  CO2 9* 9* 18* 21 23  GLUCOSE 143* 375* 158* 111* 179*  BUN 9 7 5* 5* 4*  CREATININE 0.67 0.75 0.71 0.55 0.53  CALCIUM 7.0* 7.5* 8.1* 8.0* 7.7*  MG 1.4*  --   --   --  1.6   Liver Function Tests:  Recent Labs Lab 04/27/12 2130  AST 17  ALT 14  ALKPHOS 128*  BILITOT 0.2*  PROT 8.4*  ALBUMIN 4.2    Recent Labs Lab 04/28/12 0145  LIPASE 38   No results found for this basename: AMMONIA,  in the last 168 hours CBC:  Recent Labs Lab 04/27/12 2130 04/28/12 0145 04/28/12 0538 04/29/12 0349  WBC 27.6* 23.6* 17.0* 6.3  NEUTROABS  --  20.7*  --  4.4  HGB 13.7 12.0 10.4* 9.7*  HCT 40.0 35.4* 29.9* 27.3*  MCV 90.7 91.2 89.3 86.7  PLT 362 298 202 125*   Cardiac Enzymes: No results found for this basename: CKTOTAL, CKMB, CKMBINDEX, TROPONINI,  in the last 168 hours BNP: No components found with this basename: POCBNP,  CBG:  Recent Labs Lab 04/28/12 1921 04/28/12 2025 04/28/12 2130 04/28/12 2234 04/29/12 0045  GLUCAP 148* 107* 97 103* 117*    Recent Results (from the past 240 hour(s))  MRSA PCR SCREENING     Status: None   Collection Time    04/28/12  4:55 AM      Result Value Range Status   MRSA by PCR NEGATIVE  NEGATIVE Final   Comment:            The GeneXpert MRSA Assay (FDA      approved for NASAL specimens     only), is one component of a     comprehensive MRSA colonization     surveillance program. It is not     intended to diagnose MRSA     infection nor to guide or     monitor treatment for     MRSA infections.     Scheduled Meds: . insulin aspart  0-5 Units Subcutaneous QHS  . insulin aspart  0-9 Units Subcutaneous TID WC  . insulin glargine  15 Units Subcutaneous QHS  . miconazole nitrate   Topical BID   Continuous Infusions: . dextrose 5 % and 0.45% NaCl 1,000 mL with potassium chloride 20 mEq infusion 125 mL/hr at 04/29/12 0056     TAT, DAVID, DO  [facility] Pager [phone]  If 7PM-7AM, please contact night-coverage [email or web address] Password [email or web address] 04/29/2012, 8:03 AM   LOS: 2 days

## 2012-04-29 NOTE — Progress Notes (Signed)
Report called to Herbert Seta, RN on 5th floor.

## 2012-04-30 LAB — BASIC METABOLIC PANEL
CO2: 27 mEq/L (ref 19–32)
GFR calc non Af Amer: >90 mL/min (ref >90)
Glucose, Bld: 254 mg/dL — ABNORMAL HIGH (ref 70–99)
Potassium: 3.5 mEq/L (ref 3.5–5.1)
Sodium: 138 mEq/L (ref 135–145)

## 2012-04-30 LAB — GLUCOSE, CAPILLARY: Glucose-Capillary: 302 mg/dL — ABNORMAL HIGH (ref 70–99)

## 2012-04-30 LAB — MAGNESIUM: Magnesium: 1.7 mg/dL (ref 1.5–2.5)

## 2012-04-30 MED ORDER — INSULIN ASPART 100 UNIT/ML ~~LOC~~ SOLN
5.0000 [IU] | Freq: Three times a day (TID) | SUBCUTANEOUS | Status: DC
  Administered 2012-04-30: 5 [IU] via SUBCUTANEOUS

## 2012-04-30 MED ORDER — INSULIN GLARGINE 100 UNIT/ML ~~LOC~~ SOLN: 15.0000 [IU] | Freq: Every day | SUBCUTANEOUS | Status: DC

## 2012-04-30 MED ORDER — NIACIN 500 MG PO TABS: 500.0000 mg | ORAL_TABLET | Freq: Every day | ORAL | Status: DC

## 2012-04-30 MED ORDER — INSULIN ASPART 100 UNIT/ML ~~LOC~~ SOLN: 5.0000 [IU] | Freq: Three times a day (TID) | SUBCUTANEOUS | Status: DC

## 2012-04-30 MED ORDER — NIACIN 500 MG PO TABS
500.0000 mg | ORAL_TABLET | Freq: Every day | ORAL | Status: DC
  Filled 2012-04-30: qty 1

## 2012-04-30 MED ORDER — FENOFIBRATE 160 MG PO TABS: 160.0000 mg | ORAL_TABLET | Freq: Every day | ORAL | Status: DC

## 2012-04-30 MED ORDER — PNEUMOCOCCAL VAC POLYVALENT 25 MCG/0.5ML IJ INJ
0.5000 mL | INJECTION | Freq: Once | INTRAMUSCULAR | Status: AC
  Administered 2012-04-30: 0.5 mL via INTRAMUSCULAR
  Filled 2012-04-30: qty 0.5

## 2012-04-30 NOTE — Progress Notes (Signed)
Went over DC instructions with patient. Patient says she does not have a PCP or endocrinologist because of issues with her Medicaid.  She states she is already working on resolving this and I told her she must have someone to follow-up with as soon as possible.  Patient understands she is not to use her insulin pump but take her short acting insulin with meals and Lantus daily.  Rx given for meter, strips, etc.  Patient states she knows she must have follow-up for her diabetes and understands she could die if she does not properly manage her diabetes.  Transported to front of hospital via wheelchair accompanied by friend and Psychologist, sport and exercise.

## 2012-04-30 NOTE — ED Provider Notes (Signed)
Medical screening examination/treatment/procedure(s) were conducted as a shared visit with non-physician practitioner(s) and myself.  I personally evaluated the patient during the encounter.  PLEASE SEE THE ATTENDING PROVIDER NOTE FOR CRITICAL CARE DOCUMENTATION AND PROCEDURE DOCUMENTATION.  Derwood Kaplan, MD 04/30/12 [phone]

## 2012-04-30 NOTE — Discharge Summary (Addendum)
Physician Discharge Summary  [name] WGN:[id number] DOB: 07/17/1980 DOA: 04/27/2012  PCP: Mandy Stabile, MD  Admit date: 04/27/2012 Discharge date: 04/30/2012  Recommendations for Outpatient Follow-up:  1. Pt will need to follow up with PCP in 2 weeks post discharge 2. Please obtain BMP to evaluate electrolytes and kidney function 3. Please also check CBC to evaluate Hg and Hct levels 4. Contact Dr. Elvera [name] to schedule followup appointment  Discharge Diagnoses:  Principal Problem:   DKA, type 1 Active Problems:   Altered mental status   Leukocytosis Diabetic ketoacidosis  -History of poor followup and noncompliance  -Patient seems to have little knowledge regarding her insulin pump  -She does not know any of her settings, nor does she know her CHO ratio  -Had long discussion with the patient--due to her poor insight regarding her insulin pump and poor followup with her outpatient physicians, the patient is agreeable to going home on subcutaneous insulin until she is able to follow up with her endocrinologist and primary care physician  -She will be sent home on Lantus 15 units at bedtime and NovoLog 5 units pre-meal with continued sliding scale. -Diabetic coordinator followed the patient and provided the patient education similar to her previous admissions to the hospital. -The patient was instructed to contact her primary care physician and endocrinologist, Dr. Elvera [name] to schedule followup appointment -Hemoglobin A1c was ordered during this hospitalization, but the result is still pending at the time of discharge -Anion gap is closed--started on Lantus--first dose 04/29/2012 at0130  -Discontinue bicarbonate drip  -Discontinue BMPs every 4 hours  -Repeat BMP, magnesium in the morning  -Transferred to telemetry  -The patient's anion gap improved and closed. The patient was maintained on subcutaneous insulin and her BMP and electrolytes remained optimized -The patient was  advanced to a carbohydrate modified diet and she tolerated that without any difficulty. -Prescriptions were given to the patient for Lantus, NovoLog, as well as syringes and needles -Patient stated showed he had a glucometer and testing strips Abdominal pain  -Abdominal ultrasound was negative for acute abnormalities--there was mild renal echogenicity -LFTs normal, lipase 38  -Patient states 25-50% better  -Urine pregnancy test--negative  -Urine drug screen--negative  -CT abdomen and pelvis due to persistent pain--revealed hepatomegaly with mild pancreatic atrophy. There was a normal biliary tract. There was anasarca. -Patient had previous imaging of the abdomen which previously showed hepatomegaly with fatty liver--this was likely felt to be due to the patient's hypertriglyceridemia  -Patient tolerated her diet without any vomiting Hypertriglyceridemia -Lipid panel revealed triglycerides 706 -Patient was started on TriCor during the hospitalization  -Prior to discharge, the patient informed me that her Medicaid would not pay for fenofibrate; I have prescribed niacin 500 mg at bedtime which can be titrated up by her primary care [name] -I also instructed the patient to take fish oil 2 g daily -I reiterated the importance for her to followup with her primary care [name] Leukocytosis  -Follow blood cultures--negative to date  -Urinalysis did not suggest UTI--no pyuria present  -Chest x-ray is negative  -Ceftriaxone was initially started, but this was discontinued. -The patient remained afebrile and hemodynamically stable even off of ceftriaxone Metabolic acidosis  -Patient has components of gapped and non-gap metabolic acidosis  -Gap is closed  -Patient was initially started on bicarbonate drip -As the patient continued on the insulin drip, her anion gap closed and she was transitioned to to subcutaneous insulin and her bicarbonate drip was discontinued -Her electrolytes were well  maintained  on subcutaneous insulin Acute encephalopathy  -Improved with treatment of the patient's metabolic derangements -cycle Troponins as patient is complaining of chest discomfort--negative -EKG --normal sinus rhythm without ST-T wave changes -Urine drug screen--negative   Discharge Condition: stable  Disposition:  Follow-up Information   Follow up with Mandy Stabile, MD In 1 week.   Contact information:   [facility] [address], [place] [address] [phone]       Follow up with Carlus Pavlov, MD In 1 week.   Contact information:   [address] [place] [address] [phone]       Diet: Carbohydrate modified  Wt Readings from Last 3 Encounters:  04/29/12 53.2 kg (117 lb 4.6 oz)  04/16/12 52 kg (114 lb 10.2 oz)  03/01/12 54.658 kg (120 lb 8 oz)    History of present illness:  32 year old female with history of diabetes mellitus type 1 presented with nausea shortness of breath, abdominal pain. The patient states that the battery on her insulin pump died approximately 24 hours prior to admission. She was not able to find new lithium She tried alkaline batteries without any success. Patient states that she has had insulin pump for approximately 3 years, but she has not followed up with a physician or approximately one year. She states that she is having some problems with her Medicaid. She missed her scheduled appointment in January with Dr. Derald Macleod. Since her discharge on 04/16/2012, the patient started herself back on insulin pump. The following morning, she states that her shortness of breath has significantly improved and abdominal pain is 50% better but persists. CT abdomen was ordered.     Discharge Exam: Filed Vitals:   04/30/12 1005  BP: 100/68  Pulse: 96  Temp: 98.6 F (37 C)  Resp: 14   Filed Vitals:   04/29/12 2133 04/30/12 0147 04/30/12 0548 04/30/12 1005  BP: 100/66  100/68 95/62 100/68  Pulse: 101 96 90 96  Temp: 99.1 F (37.3 C) 98.7 F (37.1 C) 98.3 F (36.8 C) 98.6 F (37 C)  TempSrc: Oral Oral Oral Oral  Resp: 16 14 12 14   Height:      Weight:      SpO2: 96% 99% 97% 98%   General: A&O x 3, NAD, pleasant, cooperative Cardiovascular: RRR, no rub, no gallop, no S3 Respiratory: CTAB, no wheeze, no rhonchi Abdomen:soft, nontender, nondistended, positive bowel sounds Extremities: No edema, No lymphangitis, no petechiae  Discharge Instructions      Discharge Orders   Future Orders Complete By Expires     Diet - low sodium heart healthy  As directed     Discharge instructions  As directed     Comments:      Do NOT use insulin pump until you follow up with your endocrinologist Call to reschedule appointment with endocrinologist Call to reschedule appointment with Primary care physician    Increase activity slowly  As directed         Medication List    STOP taking these medications       insulin pump 100 unit/ml Soln      TAKE these medications       fenofibrate 160 MG tablet  Take 1 tablet (160 mg total) by mouth daily.     insulin aspart 100 UNIT/ML injection  Commonly known as:  novoLOG  Inject 5 Units into the skin 3 (three) times daily with meals.     insulin aspart 100  UNIT/ML injection  Commonly known as:  novoLOG  Inject 5 Units into the skin 3 (three) times daily with meals.     insulin glargine 100 UNIT/ML injection  Commonly known as:  LANTUS  Inject 15 Units into the skin at bedtime.     insulin glargine 100 UNIT/ML injection  Commonly known as:  LANTUS  Inject 15 Units into the skin at bedtime.     multivitamin with minerals Tabs  Take 1 tablet by mouth daily. Diabetic support vitamin     niacin 500 MG tablet  Take 1 tablet (500 mg total) by mouth at bedtime.     ondansetron 4 MG tablet  Commonly known as:  ZOFRAN  Take 2 tablets (8 mg total) by mouth every 6 (six) hours as needed for nausea.      oxyCODONE-acetaminophen 10-325 MG per tablet  Commonly known as:  PERCOCET  Take 1 tablet by mouth every 4 (four) hours as needed for pain.     vitamin C 500 MG tablet  Commonly known as:  ASCORBIC ACID  Take 500 mg by mouth every morning.         The results of significant diagnostics from this hospitalization (including imaging, microbiology, ancillary and laboratory) are listed below for reference.    Significant Diagnostic Studies: Dg Chest 2 View  04/15/2012  *RADIOLOGY REPORT*  Clinical Data: Nausea, abdominal pain, sore throat.  History of diabetes.  Ex-smoker.  CHEST - 2 VIEW  Comparison: 02/03/2012  Findings: The heart size and pulmonary vascularity are normal. The lungs appear clear and expanded without focal air space disease or consolidation. No blunting of the costophrenic angles.  No pneumothorax.  Mediastinal contours appear intact.  Surgical clips in the base of the neck. There are metallic piercings over the nipples. There is no significant change since previous study.  IMPRESSION: No evidence of active pulmonary disease.   Original Report Authenticated By: Burman Nieves, M.D.    US Abdomen Complete  04/28/2012  *RADIOLOGY REPORT*  Clinical Data:  Elevated alkaline phosphatase.  ABDOMINAL ULTRASOUND COMPLETE  Comparison:  CT of the abdomen and pelvis performed 02/06/2012, and abdominal ultrasound performed 05/29/2007  Findings:  Gallbladder:  The gallbladder is normal in appearance, without evidence for gallstones, gallbladder wall thickening or pericholecystic fluid.  No ultrasonographic Murphy's sign is elicited.  Common Bile Duct:  0.3 cm in diameter; within normal limits in caliber.  Liver:  Normal parenchymal echogenicity and echotexture; no focal lesions identified.  Limited Doppler evaluation demonstrates normal blood flow within the liver.  IVC:  Unremarkable in appearance.  Pancreas:  Not visualized due to overlying bowel gas.  Spleen:  10.5 cm in length; within normal  limits in size and echotexture.  Right kidney:  10.8 cm in length; normal in size and configuration. Minimally increased parenchymal echogenicity is noted.  No evidence of mass or hydronephrosis.  Left kidney:  10.9 cm in length; normal in size and configuration. Minimally increased parenchymal echogenicity is noted.  No evidence of mass or hydronephrosis.  Abdominal Aorta:  Normal in caliber; no aneurysm identified.  IMPRESSION:  1.  No acute abnormality seen within the abdomen. 2.  Minimally increased renal parenchymal echogenicity may reflect medical renal disease.   Original Report Authenticated By: Tonia Ghent, M.D.    Ct Abdomen Pelvis W Contrast  04/29/2012  *RADIOLOGY REPORT*  Clinical Data: Abdominal pain.  History diabetes.  Gastroparesis.  CT ABDOMEN AND PELVIS WITH CONTRAST  Technique:  Multidetector CT imaging of the  abdomen and pelvis was performed following the standard protocol during bolus administration of intravenous contrast.  Contrast: 25mL OMNIPAQUE IOHEXOL 300 MG/ML  SOLN, OMNIPAQUE IOHEXOL 300 MG/ML  SOLN, 1 OMNIPAQUE IOHEXOL 300 MG/ML  SOLN  Comparison: 04/28/2012 abdominal ultrasound.  Prior CT of 02/06/2012.  Findings: Lung bases:  Mild bibasilar atelectasis.  Normal heart size with trace bilateral pleural fluid.  Abdomen/pelvis:  Hepatomegaly, 20.8 cm cranial caudal. No focal liver lesion.  Old granulomas disease in the spleen.  Normal stomach.  Mild pancreatic atrophy.  Gallbladder partially contracted, but normal in appearance on prior ultrasound.  Normal biliary tract.  Normal adrenal glands.  Normal kidneys. No retroperitoneal or retrocrural adenopathy.  Normal colon, appendix, and terminal ileum.  Normal small bowel without abdominal ascites.  Anasarca.  Nonspecific air in the left inguinal region on image 80/series 2. No pelvic adenopathy.  Air within urinary bladder. Normal uterus, without adnexal mass. Trace free pelvic fluid is likely physiologic.   Bones/Musculoskeletal:  No acute osseous abnormality.  IMPRESSION: 1. No acute process in the abdomen or pelvis.  2.  Trace cul-de-sac fluid is likely physiologic.  3.  Air within urinary bladder.  Correlate with recent instrumentation.  4.  Trace bilateral pleural fluid.  5.  Anasarca.  Nonspecific subcutaneous air in the left inguinal region.  Correlate with prior instrumentation or injections. 6.  Hepatomegaly.   Original Report Authenticated By: Jeronimo Greaves, M.D.    Dg Chest Port 1 View  04/28/2012  *RADIOLOGY REPORT*  Clinical Data: Abdominal pain, diabetes  PORTABLE CHEST - 1 VIEW  Comparison: Plain film 04/15/2012  Findings: Normal mediastinum and cardiac silhouette.  Normal pulmonary  vasculature.  No evidence of effusion, infiltrate, or pneumothorax.  No acute bony abnormality.  Vascular clips noted in the thyroid bed.  IMPRESSION: No acute cardiopulmonary process.   Original Report Authenticated By: Genevive Bi, M.D.      Microbiology: Recent Results (from the past 240 hour(s))  CULTURE, BLOOD (ROUTINE X 2)     Status: None   Collection Time    04/28/12 12:20 AM      Result Value Range Status   Specimen Description BLOOD CENTRAL LINE   Final   Special Requests BOTTLES DRAWN AEROBIC AND ANAEROBIC 5CC   Final   Culture  Setup Time 04/28/2012 06:27   Final   Culture     Final   Value:        BLOOD CULTURE RECEIVED NO GROWTH TO DATE CULTURE WILL BE HELD FOR 5 DAYS BEFORE ISSUING A FINAL NEGATIVE REPORT   Report Status PENDING   Incomplete  MRSA PCR SCREENING     Status: None   Collection Time    04/28/12  4:55 AM      Result Value Range Status   MRSA by PCR NEGATIVE  NEGATIVE Final   Comment:            The GeneXpert MRSA Assay (FDA     approved for NASAL specimens     only), is one component of a     comprehensive MRSA colonization     surveillance program. It is not     intended to diagnose MRSA     infection nor to guide or     monitor treatment for     MRSA infections.      Labs: Basic Metabolic Panel:  Recent Labs Lab 04/28/12 0900 04/28/12 1438 04/28/12 1759 04/28/12 2220 04/29/12 0349 04/30/12 0515  NA 132* 128* 130* 132*  133* 138  K 3.5 3.7 3.0* 2.9* 3.7 3.5  CL 100 95* 97 100 101 105  CO2 9* 9* 18* 21 23 27   GLUCOSE 143* 375* 158* 111* 179* 254*  BUN 9 7 5* 5* 4* 7  CREATININE 0.67 0.75 0.71 0.55 0.53 0.56  CALCIUM 7.0* 7.5* 8.1* 8.0* 7.7* 8.4  MG 1.4*  --   --   --  1.6 1.7   Liver Function Tests:  Recent Labs Lab 04/27/12 2130  AST 17  ALT 14  ALKPHOS 128*  BILITOT 0.2*  PROT 8.4*  ALBUMIN 4.2    Recent Labs Lab 04/28/12 0145  LIPASE 38   No results found for this basename: AMMONIA,  in the last 168 hours CBC:  Recent Labs Lab 04/27/12 2130 04/28/12 0145 04/28/12 0538 04/29/12 0349  WBC 27.6* 23.6* 17.0* 6.3  NEUTROABS  --  20.7*  --  4.4  HGB 13.7 12.0 10.4* 9.7*  HCT 40.0 35.4* 29.9* 27.3*  MCV 90.7 91.2 89.3 86.7  PLT 362 298 202 125*   Cardiac Enzymes:  Recent Labs Lab 04/29/12 0906 04/29/12 1508 04/29/12 2110  TROPONINI <0.30 <0.30 <0.30   BNP: No components found with this basename: POCBNP,  CBG:  Recent Labs Lab 04/29/12 1056 04/29/12 1645 04/29/12 2131 04/30/12 0731 04/30/12 1112  GLUCAP 196* 324* 302* 191* 249*    Time coordinating discharge:  Greater than 30 minutes  Signed:  TAT, DAVID, DO [facility] Pager: [phone] 04/30/2012, 12:01 PM

## 2012-05-04 LAB — CULTURE, BLOOD (ROUTINE X 2): Culture: NO GROWTH

## 2012-05-12 ENCOUNTER — Observation Stay (HOSPITAL_COMMUNITY)
Admission: EM | Admit: 2012-05-12 | Discharge: 2012-05-14 | Disposition: A | Discharge status: Home / Self Care | Payer: Medicaid Other | Attending: Family Medicine | Admitting: Family Medicine

## 2012-05-12 ENCOUNTER — Encounter (HOSPITAL_COMMUNITY): Payer: Self-pay | Admitting: *Deleted

## 2012-05-12 ENCOUNTER — Emergency Department (HOSPITAL_COMMUNITY): Payer: Medicaid Other

## 2012-05-12 DIAGNOSIS — E1049 Type 1 diabetes mellitus with other diabetic neurological complication: Principal | ICD-10-CM | POA: Insufficient documentation

## 2012-05-12 DIAGNOSIS — E079 Disorder of thyroid, unspecified: Secondary | ICD-10-CM

## 2012-05-12 DIAGNOSIS — E1010 Type 1 diabetes mellitus with ketoacidosis without coma: Secondary | ICD-10-CM

## 2012-05-12 DIAGNOSIS — R112 Nausea with vomiting, unspecified: Secondary | ICD-10-CM | POA: Insufficient documentation

## 2012-05-12 DIAGNOSIS — E860 Dehydration: Secondary | ICD-10-CM

## 2012-05-12 DIAGNOSIS — E871 Hypo-osmolality and hyponatremia: Secondary | ICD-10-CM

## 2012-05-12 DIAGNOSIS — E1065 Type 1 diabetes mellitus with hyperglycemia: Principal | ICD-10-CM | POA: Insufficient documentation

## 2012-05-12 DIAGNOSIS — E876 Hypokalemia: Secondary | ICD-10-CM

## 2012-05-12 DIAGNOSIS — G9341 Metabolic encephalopathy: Secondary | ICD-10-CM

## 2012-05-12 DIAGNOSIS — N179 Acute kidney failure, unspecified: Secondary | ICD-10-CM

## 2012-05-12 DIAGNOSIS — Z79899 Other long term (current) drug therapy: Secondary | ICD-10-CM | POA: Insufficient documentation

## 2012-05-12 DIAGNOSIS — E1143 Type 2 diabetes mellitus with diabetic autonomic (poly)neuropathy: Secondary | ICD-10-CM

## 2012-05-12 DIAGNOSIS — D649 Anemia, unspecified: Secondary | ICD-10-CM

## 2012-05-12 DIAGNOSIS — R4182 Altered mental status, unspecified: Secondary | ICD-10-CM

## 2012-05-12 DIAGNOSIS — N390 Urinary tract infection, site not specified: Secondary | ICD-10-CM

## 2012-05-12 DIAGNOSIS — D72829 Elevated white blood cell count, unspecified: Secondary | ICD-10-CM

## 2012-05-12 DIAGNOSIS — K3184 Gastroparesis: Secondary | ICD-10-CM | POA: Insufficient documentation

## 2012-05-12 DIAGNOSIS — Z794 Long term (current) use of insulin: Secondary | ICD-10-CM | POA: Insufficient documentation

## 2012-05-12 DIAGNOSIS — R739 Hyperglycemia, unspecified: Secondary | ICD-10-CM

## 2012-05-12 DIAGNOSIS — N12 Tubulo-interstitial nephritis, not specified as acute or chronic: Secondary | ICD-10-CM

## 2012-05-12 DIAGNOSIS — K219 Gastro-esophageal reflux disease without esophagitis: Secondary | ICD-10-CM | POA: Insufficient documentation

## 2012-05-12 DIAGNOSIS — E1110 Type 2 diabetes mellitus with ketoacidosis without coma: Secondary | ICD-10-CM

## 2012-05-12 DIAGNOSIS — E1149 Type 2 diabetes mellitus with other diabetic neurological complication: Secondary | ICD-10-CM

## 2012-05-12 LAB — URINE MICROSCOPIC-ADD ON

## 2012-05-12 LAB — GLUCOSE, CAPILLARY
Glucose-Capillary: 386 mg/dL — ABNORMAL HIGH (ref 70–99)
Glucose-Capillary: 343 mg/dL — ABNORMAL HIGH (ref 70–99)
Glucose-Capillary: 274 mg/dL — ABNORMAL HIGH (ref 70–99)
Glucose-Capillary: 204 mg/dL — ABNORMAL HIGH (ref 70–99)

## 2012-05-12 LAB — URINALYSIS, ROUTINE W REFLEX MICROSCOPIC
Bilirubin Urine: NEGATIVE
Glucose, UA: >1000 mg/dL — AB
Ketones, ur: >80 mg/dL — AB
Leukocytes, UA: NEGATIVE
Nitrite: NEGATIVE
Protein, ur: NEGATIVE mg/dL
Specific Gravity, Urine: 1.037 — ABNORMAL HIGH (ref 1.005–1.030)
Urobilinogen, UA: 1 mg/dL (ref 0.0–1.0)
pH: 8 (ref 5.0–8.0)

## 2012-05-12 LAB — COMPREHENSIVE METABOLIC PANEL WITH GFR
Albumin: 3.9 g/dL (ref 3.5–5.2)
BUN: 23 mg/dL (ref 6–23)
Calcium: 8.9 mg/dL (ref 8.4–10.5)
Creatinine, Ser: 0.66 mg/dL (ref 0.50–1.10)
GFR calc Af Amer: >90 mL/min (ref >90)
Total Protein: 7.8 g/dL (ref 6.0–8.3)

## 2012-05-12 LAB — CBC
HCT: 40.5 % (ref 36.0–46.0)
Hemoglobin: 13.5 g/dL (ref 12.0–15.0)
MCH: 30.6 pg (ref 26.0–34.0)
MCHC: 33.3 g/dL (ref 30.0–36.0)
MCV: 91.8 fL (ref 78.0–100.0)
Platelets: 240 10*3/uL (ref 150–400)
RBC: 4.41 MIL/uL (ref 3.87–5.11)
RDW: 15.2 % (ref 11.5–15.5)
WBC: 7.6 10*3/uL (ref 4.0–10.5)

## 2012-05-12 LAB — COMPREHENSIVE METABOLIC PANEL
ALT: 21 U/L (ref 0–35)
AST: 19 U/L (ref 0–37)
Alkaline Phosphatase: 66 U/L (ref 39–117)
CO2: 20 mEq/L (ref 19–32)
Chloride: 97 mEq/L (ref 96–112)
GFR calc non Af Amer: >90 mL/min (ref >90)
Glucose, Bld: 325 mg/dL — ABNORMAL HIGH (ref 70–99)
Potassium: 3.9 mEq/L (ref 3.5–5.1)
Sodium: 134 mEq/L — ABNORMAL LOW (ref 135–145)
Total Bilirubin: 0.4 mg/dL (ref 0.3–1.2)

## 2012-05-12 MED ORDER — HYDROMORPHONE HCL PF 1 MG/ML IJ SOLN
1.0000 mg | INTRAMUSCULAR | Status: AC
  Administered 2012-05-12 (×2): 1 mg via INTRAVENOUS
  Filled 2012-05-12 (×2): qty 1

## 2012-05-12 MED ORDER — ACETAMINOPHEN 650 MG RE SUPP: 650.0000 mg | Freq: Four times a day (QID) | RECTAL | Status: DC | PRN

## 2012-05-12 MED ORDER — VITAMIN C 500 MG PO TABS
500.0000 mg | ORAL_TABLET | Freq: Every morning | ORAL | Status: DC
  Administered 2012-05-13 – 2012-05-14 (×2): 500 mg via ORAL
  Filled 2012-05-12 (×2): qty 1

## 2012-05-12 MED ORDER — ONDANSETRON HCL 4 MG/2ML IJ SOLN: 4.0000 mg | Freq: Four times a day (QID) | INTRAMUSCULAR | Status: DC | PRN

## 2012-05-12 MED ORDER — OXYCODONE-ACETAMINOPHEN 5-325 MG PO TABS
1.0000 | ORAL_TABLET | ORAL | Status: DC | PRN
  Administered 2012-05-13 – 2012-05-14 (×9): 1 via ORAL
  Filled 2012-05-12 (×9): qty 1

## 2012-05-12 MED ORDER — INSULIN REGULAR BOLUS VIA INFUSION
0.0000 [IU] | Freq: Three times a day (TID) | INTRAVENOUS | Status: DC
  Filled 2012-05-12: qty 10

## 2012-05-12 MED ORDER — ACETAMINOPHEN 325 MG PO TABS: 650.0000 mg | ORAL_TABLET | Freq: Four times a day (QID) | ORAL | Status: DC | PRN

## 2012-05-12 MED ORDER — PANTOPRAZOLE SODIUM 40 MG IV SOLR
40.0000 mg | Freq: Two times a day (BID) | INTRAVENOUS | Status: DC
  Administered 2012-05-13 – 2012-05-14 (×4): 40 mg via INTRAVENOUS
  Filled 2012-05-12 (×5): qty 40

## 2012-05-12 MED ORDER — METOCLOPRAMIDE HCL 5 MG PO TABS
5.0000 mg | ORAL_TABLET | Freq: Four times a day (QID) | ORAL | Status: DC
  Administered 2012-05-13 – 2012-05-14 (×7): 5 mg via ORAL
  Filled 2012-05-12 (×10): qty 1

## 2012-05-12 MED ORDER — DEXTROSE 50 % IV SOLN: 25.0000 mL | INTRAVENOUS | Status: DC | PRN

## 2012-05-12 MED ORDER — SODIUM CHLORIDE 0.9 % IV SOLN
INTRAVENOUS | Status: DC
  Administered 2012-05-12: 3.3 [IU]/h via INTRAVENOUS
  Filled 2012-05-12: qty 1

## 2012-05-12 MED ORDER — ADULT MULTIVITAMIN W/MINERALS CH
1.0000 | ORAL_TABLET | Freq: Every day | ORAL | Status: DC
  Administered 2012-05-13 – 2012-05-14 (×2): 1 via ORAL
  Filled 2012-05-12 (×2): qty 1

## 2012-05-12 MED ORDER — SODIUM CHLORIDE 0.9 % IV SOLN
INTRAVENOUS | Status: DC
  Administered 2012-05-13: 01:00:00 via INTRAVENOUS
  Filled 2012-05-12: qty 1

## 2012-05-12 MED ORDER — OXYCODONE-ACETAMINOPHEN 10-325 MG PO TABS: 1.0000 | ORAL_TABLET | ORAL | Status: DC | PRN

## 2012-05-12 MED ORDER — ONDANSETRON HCL 4 MG/2ML IJ SOLN
4.0000 mg | Freq: Once | INTRAMUSCULAR | Status: AC
  Administered 2012-05-12: 4 mg via INTRAVENOUS
  Filled 2012-05-12: qty 2

## 2012-05-12 MED ORDER — SODIUM CHLORIDE 0.9 % IV SOLN: INTRAVENOUS | Status: DC

## 2012-05-12 MED ORDER — DEXTROSE-NACL 5-0.45 % IV SOLN: INTRAVENOUS | Status: DC

## 2012-05-12 MED ORDER — OXYCODONE HCL 5 MG PO TABS
5.0000 mg | ORAL_TABLET | ORAL | Status: DC | PRN
  Administered 2012-05-13 – 2012-05-14 (×9): 5 mg via ORAL
  Filled 2012-05-12 (×9): qty 1

## 2012-05-12 MED ORDER — SODIUM CHLORIDE 0.9 % IV SOLN
INTRAVENOUS | Status: DC
  Administered 2012-05-13 – 2012-05-14 (×5): via INTRAVENOUS

## 2012-05-12 MED ORDER — PANTOPRAZOLE SODIUM 40 MG IV SOLR
40.0000 mg | Freq: Once | INTRAVENOUS | Status: AC
  Administered 2012-05-12: 40 mg via INTRAVENOUS
  Filled 2012-05-12: qty 40

## 2012-05-12 MED ORDER — ONDANSETRON HCL 4 MG PO TABS
4.0000 mg | ORAL_TABLET | Freq: Four times a day (QID) | ORAL | Status: DC | PRN
  Administered 2012-05-13: 4 mg via ORAL
  Filled 2012-05-12: qty 1

## 2012-05-12 NOTE — ED Notes (Signed)
Per glucostabilizer patient rate to remain at 4.3units/hr

## 2012-05-12 NOTE — ED Notes (Signed)
Glucostabilizer started-- CBG to be collected in 1 hour.

## 2012-05-12 NOTE — ED Notes (Signed)
Patient transported to X-ray 

## 2012-05-12 NOTE — H&P (Addendum)
[facility] History and Physical  SHAHEEN STAR ZOX:[id number] DOB: 1980/11/01 DOA: 05/12/2012  Referring physician: Dr. Oletta Lamas PCP: [name], MD  Specialists: none  Chief Complaint: Nausea vomiting, and elevated BG  HPI: YELITZA REACH is a 32 y.o. female GERD, diabetic gastroparesis,diabetes mellitus type 1 with prior admissions for DKA last discharge 3/9 presents with nausea vomiting for x1, nonbloody. She states that when she checked her sugars were elevated and so she called EMS and per EMS her sugars were in the 400s.She reports that  Her child has had a gastrointestinal viral illness with nausea vomiting, and she began having nausea or vomiting as well with abdominal cramping. She denies diarrhea, dysuria, cough, fever melena and no hematochezia. Upon arrival in the ED her blood glucose was 325, with a CO2 of 20, and UA with greater than 80 ketones. she was started on the glucostablizer and is admitted for further evaluation and management.  Review of Systems: The patient denies anorexia, fever, weight loss,, vision loss, decreased hearing, hoarseness, chest pain, syncope, dyspnea on exertion, peripheral edema, balance deficits, hemoptysis, abdominal pain, melena, hematochezia, severe indigestion/heartburn, hematuria, incontinence, genital sores, muscle weakness, suspicious skin lesions, transient blindness, difficulty walking, depression, unusual weight change, abnormal bleeding.  Past Medical History  Diagnosis Date  . Diabetes mellitus   . Thyroid disease   . Back pain   . Panic attack   . PONV (postoperative nausea and vomiting)   . Anxiety   . GERD (gastroesophageal reflux disease)   . DM gastroparesis    Past Surgical History  Procedure Laterality Date  . Laparoscopy      age 59  . Back surgery      age 21  . Tooth extraction     Social History:  reports that she quit smoking about 12 years ago. She has quit using smokeless tobacco. She reports that   drinks alcohol. She reports that she does not use illicit drugs.  where does patient live--home, Can patient participate in ADLs-yes  Allergies  Allergen Reactions  . Scallops (Shellfish Allergy) Anaphylaxis  . Morphine And Related Itching  . Tylenol (Acetaminophen) Itching    Family History  Problem Relation Age of Onset  . Other Mother   . Other Father   Grandmother  With DM  Prior to Admission medications   Medication Sig Start Date End Date Taking? Authorizing Provider  insulin aspart (NOVOLOG) 100 UNIT/ML injection Inject 5 Units into the skin 3 (three) times daily with meals. 04/30/12  Yes Catarina Hartshorn, MD  insulin glargine (LANTUS) 100 UNIT/ML injection Inject 16 Units into the skin at bedtime. 04/30/12  Yes Catarina Hartshorn, MD  Multiple Vitamin (MULTIVITAMIN WITH MINERALS) TABS Take 1 tablet by mouth daily. Diabetic support vitamin   Yes Historical Provider, MD  ondansetron (ZOFRAN) 4 MG tablet Take 2 tablets (8 mg total) by mouth every 6 (six) hours as needed for nausea. 04/16/12  Yes Alison Murray, MD  oxyCODONE-acetaminophen (PERCOCET) 10-325 MG per tablet Take 1 tablet by mouth every 4 (four) hours as needed for pain. 04/16/12  Yes Alison Murray, MD  vitamin C (ASCORBIC ACID) 500 MG tablet Take 500 mg by mouth every morning.    Yes Historical Provider, MD  niacin 500 MG tablet Take 1 tablet (500 mg total) by mouth at bedtime. 04/30/12   Catarina Hartshorn, MD   Physical Exam: Filed Vitals:   05/12/12 1745 05/12/12 1755 05/12/12 1913 05/12/12 1946  BP:  120/80  99/56  Pulse:  87  115  Temp:    98.2 F (36.8 C)  TempSrc:    Oral  Resp:  12  20  Height:   5\' 2"  (1.575 m)   Weight:   48.535 kg (107 lb)   SpO2: 99% 100%  99%    Constitutional: Vital signs reviewed.  Patient is a well-developed and well-nourished  in no acute distress and cooperative with exam. Alert and oriented x3.  Head: Normocephalic and atraumatic Mouth: no erythema or exudates, MMM Eyes: PERRL, EOMI, conjunctivae  normal, No scleral icterus.  Neck: Supple, Trachea midline normal ROM, No JVD, mass, thyromegaly, or carotid bruit present.  Cardiovascular: RRR, S1 normal, S2 normal, no MRG, pulses symmetric and intact bilaterally Pulmonary/Chest: CTAB, no wheezes, rales, or rhonchi Abdominal: Soft. Mild diffuse tenderness present, no rebound. non-distended, bowel sounds are normal, no masses, organomegaly, or guarding present.  GU: no CVA tenderness Extremities: No cyanosis and no edema  Neurological: A&O x3, Strength is normal and symmetric bilaterally, cranial nerve II-XII are grossly intact, no focal motor deficit, sensory intact to light touch bilaterally.  Skin: Warm, dry and intact. No rash, cyanosis, or clubbing.  Psychiatric: Normal mood and affect. speech and behavior is normal. Judgment and thought content normal. Cognition and memory are normal.    Labs on Admission:  Basic Metabolic Panel:  Recent Labs Lab 05/12/12 1817  NA 134*  K 3.9  CL 97  CO2 20  GLUCOSE 325*  BUN 23  CREATININE 0.66  CALCIUM 8.9   Liver Function Tests:  Recent Labs Lab 05/12/12 1817  AST 19  ALT 21  ALKPHOS 66  BILITOT 0.4  PROT 7.8  ALBUMIN 3.9   No results found for this basename: LIPASE, AMYLASE,  in the last 168 hours No results found for this basename: AMMONIA,  in the last 168 hours CBC:  Recent Labs Lab 05/12/12 1817  WBC 7.6  HGB 13.5  HCT 40.5  MCV 91.8  PLT 240   Cardiac Enzymes: No results found for this basename: CKTOTAL, CKMB, CKMBINDEX, TROPONINI,  in the last 168 hours  BNP (last 3 results) No results found for this basename: PROBNP,  in the last 8760 hours CBG:  Recent Labs Lab 05/12/12 1812 05/12/12 1911 05/12/12 2008 05/12/12 2122  GLUCAP 320* 386* 343* 274*    Radiological Exams on Admission: Dg Abd Acute W/chest  05/12/2012  *RADIOLOGY REPORT*  Clinical Data: Abdominal pain.  ACUTE ABDOMEN SERIES (ABDOMEN 2 VIEW & CHEST 1 VIEW)  Comparison: 04/28/2012 and  prior chest radiographs.  04/20/2011 abdominal radiograph  Findings: The cardiomediastinal silhouette is unremarkable.  The lungs are clear. There is no evidence of airspace disease, pleural effusion or pneumothorax. Clips in the region of the thyroid are again noted.  Nondistended gas and stool filled colon and rectum noted. A few nondistended fluid and gas-filled loops of small bowel are present with fluid. The stomach contains fluid. There is no evidence of bowel obstruction or pneumoperitoneum. No suspicious calcifications are present.  IMPRESSION: Nonspecific nonobstructive bowel gas pattern - which can be seen with gastroenteritis.  No evidence of pneumoperitoneum.  No evidence of active cardiopulmonary disease.   Original Report Authenticated By: Harmon Pier, M.D.     EKG: Independently reviewed.   Assessment/Plan Active Problems:   Nausea and vomiting -   DM gastroparesis versus gastroenteritis versus GERD -Will place on Reglan, PPI, IV fluids/supportive care and follow -UA neg, Also obtain lipase and follow.   Hyperglycemia/DM (diabetes mellitus),  type 1, uncontrolled -Continue IV insulin via glucose stabilizer -Monitor Accu-Cheks and when she meets criteria transition to Lantus/NovoLog -Precipitated by #1 above.  Code Status: full Family Communication: family at bedside Disposition Plan: admit for observation  Time spent: >50mins  Kela Millin [facility] Pager [phone]  If 7PM-7AM, please contact night-coverage [email or web address] Password [facility] 05/12/2012, 10:20 PM

## 2012-05-12 NOTE — ED Notes (Signed)
Attempted to call report, no answer

## 2012-05-12 NOTE — ED Provider Notes (Signed)
History     CSN: [id number]  Arrival date & time 05/12/12  1734   First MD Initiated Contact with Patient 05/12/12 1824      Chief Complaint  Patient presents with  . Hyperglycemia    (Consider location/radiation/quality/duration/timing/severity/associated sxs/prior treatment) HPI Comments: Level 5 caveat due to condition of patient and acuity.  Pt with h/o brittle diabetes, multiple episodes of DKA in the past.  Pt was admitted a few weeks ago for DKA, was discharged feeling well.  She is using her typical scale and taking insulin when she is supposed to . She reports one of her children had N/V for 24 hours recently and she began having some abd pain and vomiting this AM and quickly got worse N/V and generalized abd pain.  At home this AM, her sugar was 250, per EMS it was >400.  No diarrhea, no fevers at home.  Pain is in generalized abd, sharp, persistent, non radiating.    The history is provided by the patient.    Past Medical History  Diagnosis Date  . Diabetes mellitus   . Thyroid disease   . Back pain   . Panic attack   . PONV (postoperative nausea and vomiting)   . Anxiety   . GERD (gastroesophageal reflux disease)   . DM gastroparesis     Past Surgical History  Procedure Laterality Date  . Laparoscopy      age 14  . Back surgery      age 15  . Tooth extraction      Family History  Problem Relation Age of Onset  . Other Mother   . Other Father     History  Substance Use Topics  . Smoking status: Former Smoker    Quit date: 04/19/2000  . Smokeless tobacco: Former Neurosurgeon  . Alcohol Use: Yes     Comment: social, none now 03/01/2012    OB History   Grav Para Term Preterm Abortions TAB SAB Ect Mult Living                  Review of Systems  Unable to perform ROS: Acuity of condition    Allergies  Scallops; Morphine and related; and Tylenol  Home Medications   Current Outpatient Rx  Name  Route  Sig  Dispense  Refill  . insulin aspart (NOVOLOG)  100 UNIT/ML injection   Subcutaneous   Inject 5 Units into the skin 3 (three) times daily with meals.   1 vial   0   . insulin glargine (LANTUS) 100 UNIT/ML injection   Subcutaneous   Inject 16 Units into the skin at bedtime.         . Multiple Vitamin (MULTIVITAMIN WITH MINERALS) TABS   Oral   Take 1 tablet by mouth daily. Diabetic support vitamin         . ondansetron (ZOFRAN) 4 MG tablet   Oral   Take 2 tablets (8 mg total) by mouth every 6 (six) hours as needed for nausea.   45 tablet   0   . oxyCODONE-acetaminophen (PERCOCET) 10-325 MG per tablet   Oral   Take 1 tablet by mouth every 4 (four) hours as needed for pain.   30 tablet   0   . vitamin C (ASCORBIC ACID) 500 MG tablet   Oral   Take 500 mg by mouth every morning.          . niacin 500 MG tablet   Oral  Take 1 tablet (500 mg total) by mouth at bedtime.   30 tablet   1     BP 99/56  Pulse 115  Temp(Src) 98.2 F (36.8 C) (Oral)  Resp 20  Ht 5\' 2"  (1.575 m)  Wt 107 lb (48.535 kg)  BMI 19.57 kg/m2  SpO2 99%  LMP 04/09/2012  Physical Exam  Constitutional: She is oriented to person, place, and time. She appears well-developed and well-nourished. She appears distressed.  HENT:  Head: Normocephalic and atraumatic.  Eyes: EOM are normal.  Neck: Normal range of motion. Neck supple.  Cardiovascular: Regular rhythm and intact distal pulses.  Tachycardia present.   No murmur heard. Pulmonary/Chest: Effort normal. No respiratory distress. She has no wheezes.  Abdominal: Soft. Normal appearance. She exhibits no distension. Bowel sounds are increased. There is generalized tenderness. There is guarding. There is no rebound and no CVA tenderness.  Neurological: She is alert and oriented to person, place, and time. She has normal strength. GCS eye subscore is 4. GCS verbal subscore is 5. GCS motor subscore is 6.  Skin: Skin is warm. No rash noted. She is not diaphoretic. No pallor.  Psychiatric: She has a  normal mood and affect.    ED Course  Procedures (including critical care time)  Labs Reviewed  COMPREHENSIVE METABOLIC PANEL - Abnormal; Notable for the following:    Sodium 134 (*)    Glucose, Bld 325 (*)    All other components within normal limits  URINALYSIS, ROUTINE W REFLEX MICROSCOPIC - Abnormal; Notable for the following:    Specific Gravity, Urine 1.037 (*)    Glucose, UA >1000 (*)    Hgb urine dipstick LARGE (*)    Ketones, ur >80 (*)    All other components within normal limits  GLUCOSE, CAPILLARY - Abnormal; Notable for the following:    Glucose-Capillary 320 (*)    All other components within normal limits  GLUCOSE, CAPILLARY - Abnormal; Notable for the following:    Glucose-Capillary 386 (*)    All other components within normal limits  URINE MICROSCOPIC-ADD ON - Abnormal; Notable for the following:    Squamous Epithelial / LPF FEW (*)    Bacteria, UA FEW (*)    All other components within normal limits  GLUCOSE, CAPILLARY - Abnormal; Notable for the following:    Glucose-Capillary 343 (*)    All other components within normal limits  GLUCOSE, CAPILLARY - Abnormal; Notable for the following:    Glucose-Capillary 274 (*)    All other components within normal limits  CBC   Dg Abd Acute W/chest  05/12/2012  *RADIOLOGY REPORT*  Clinical Data: Abdominal pain.  ACUTE ABDOMEN SERIES (ABDOMEN 2 VIEW & CHEST 1 VIEW)  Comparison: 04/28/2012 and prior chest radiographs.  04/20/2011 abdominal radiograph  Findings: The cardiomediastinal silhouette is unremarkable.  The lungs are clear. There is no evidence of airspace disease, pleural effusion or pneumothorax. Clips in the region of the thyroid are again noted.  Nondistended gas and stool filled colon and rectum noted. A few nondistended fluid and gas-filled loops of small bowel are present with fluid. The stomach contains fluid. There is no evidence of bowel obstruction or pneumoperitoneum. No suspicious calcifications are  present.  IMPRESSION: Nonspecific nonobstructive bowel gas pattern - which can be seen with gastroenteritis.  No evidence of pneumoperitoneum.  No evidence of active cardiopulmonary disease.   Original Report Authenticated By: Harmon Pier, M.D.      1. Diabetic ketoacidosis     ra  sat is 100% and I interpret to be normal.   7:45 PM Pt's symptoms are somewhat improved.  Anion gap is 17 with still normal bicarb of 20.  Greater than 80 ketones in urine however.  Will continue IVF's and IV insulin, discuss with [facility].   Abd remains soft, still with some pain, will get plain films given pain is diffuse.  Clinically, not a surgical abdomen.    9:42 PM Spoke to [facility] who agrees to admit under observation.  MDM  Pt with hyperglycemia, tendency to go into DKA quickly.  Pt with expsure to gastroenteritis which may be trigger.  Will treat pain, symptoms, give IVF's, and IV insulin quickly, labs pending.          Gavin Pound. Kimothy Kishimoto, MD 05/12/12 2142

## 2012-05-12 NOTE — ED Notes (Signed)
[id number]> Expected date:<BR> Expected time:<BR> Means of arrival:<BR> Comments:<BR> EMS, Hyperglicemia

## 2012-05-12 NOTE — ED Notes (Signed)
Per EMS pt c/o high blood sugar, cbg's en route 449 pt given 4 mg of zofran IV for nausea and 600 ml NS.

## 2012-05-12 NOTE — ED Notes (Signed)
Patient ok to drinkper Dr Oletta Lamas

## 2012-05-13 DIAGNOSIS — E1110 Type 2 diabetes mellitus with ketoacidosis without coma: Secondary | ICD-10-CM

## 2012-05-13 DIAGNOSIS — E876 Hypokalemia: Secondary | ICD-10-CM

## 2012-05-13 DIAGNOSIS — E1065 Type 1 diabetes mellitus with hyperglycemia: Secondary | ICD-10-CM

## 2012-05-13 DIAGNOSIS — R112 Nausea with vomiting, unspecified: Secondary | ICD-10-CM

## 2012-05-13 DIAGNOSIS — R7309 Other abnormal glucose: Secondary | ICD-10-CM

## 2012-05-13 LAB — BASIC METABOLIC PANEL
BUN: 18 mg/dL (ref 6–23)
CO2: 15 mEq/L — ABNORMAL LOW (ref 19–32)
CO2: 15 mEq/L — ABNORMAL LOW (ref 19–32)
Calcium: 7.1 mg/dL — ABNORMAL LOW (ref 8.4–10.5)
Chloride: 109 mEq/L (ref 96–112)
Chloride: 110 mEq/L (ref 96–112)
Creatinine, Ser: 0.55 mg/dL (ref 0.50–1.10)
Creatinine, Ser: 0.49 mg/dL — ABNORMAL LOW (ref 0.50–1.10)
Glucose, Bld: 138 mg/dL — ABNORMAL HIGH (ref 70–99)
Glucose, Bld: 159 mg/dL — ABNORMAL HIGH (ref 70–99)
Potassium: 3.5 mEq/L (ref 3.5–5.1)

## 2012-05-13 LAB — GLUCOSE, CAPILLARY
Glucose-Capillary: 142 mg/dL — ABNORMAL HIGH (ref 70–99)
Glucose-Capillary: 188 mg/dL — ABNORMAL HIGH (ref 70–99)
Glucose-Capillary: 174 mg/dL — ABNORMAL HIGH (ref 70–99)
Glucose-Capillary: 112 mg/dL — ABNORMAL HIGH (ref 70–99)
Glucose-Capillary: 119 mg/dL — ABNORMAL HIGH (ref 70–99)
Glucose-Capillary: 143 mg/dL — ABNORMAL HIGH (ref 70–99)

## 2012-05-13 LAB — URINALYSIS, DIPSTICK ONLY
Bilirubin Urine: NEGATIVE
Hgb urine dipstick: NEGATIVE
Ketones, ur: NEGATIVE mg/dL
Specific Gravity, Urine: 1.018 (ref 1.005–1.030)
Urobilinogen, UA: 0.2 mg/dL (ref 0.0–1.0)
pH: 5 (ref 5.0–8.0)

## 2012-05-13 LAB — KETONES, QUALITATIVE: Acetone, Bld: NEGATIVE

## 2012-05-13 MED ORDER — INSULIN ASPART 100 UNIT/ML ~~LOC~~ SOLN
0.0000 [IU] | Freq: Three times a day (TID) | SUBCUTANEOUS | Status: DC
  Administered 2012-05-13: 2 [IU] via SUBCUTANEOUS
  Administered 2012-05-13: 5 [IU] via SUBCUTANEOUS
  Administered 2012-05-14: 1 [IU] via SUBCUTANEOUS
  Administered 2012-05-14: 3 [IU] via SUBCUTANEOUS

## 2012-05-13 MED ORDER — INSULIN GLARGINE 100 UNIT/ML ~~LOC~~ SOLN
10.0000 [IU] | Freq: Every day | SUBCUTANEOUS | Status: DC
  Administered 2012-05-13 (×2): 10 [IU] via SUBCUTANEOUS
  Filled 2012-05-13 (×3): qty 0.1

## 2012-05-13 MED ORDER — POTASSIUM CHLORIDE CRYS ER 20 MEQ PO TBCR
40.0000 meq | EXTENDED_RELEASE_TABLET | Freq: Once | ORAL | Status: AC
  Administered 2012-05-13: 40 meq via ORAL
  Filled 2012-05-13: qty 2

## 2012-05-13 MED ORDER — HYDROMORPHONE HCL PF 1 MG/ML IJ SOLN
1.0000 mg | Freq: Once | INTRAMUSCULAR | Status: AC
  Administered 2012-05-13: 1 mg via INTRAVENOUS
  Filled 2012-05-13: qty 1

## 2012-05-13 MED ORDER — INSULIN ASPART 100 UNIT/ML ~~LOC~~ SOLN
0.0000 [IU] | Freq: Every day | SUBCUTANEOUS | Status: DC
  Administered 2012-05-13: 2 [IU] via SUBCUTANEOUS

## 2012-05-13 MED ORDER — DIPHENHYDRAMINE HCL 25 MG PO CAPS
25.0000 mg | ORAL_CAPSULE | Freq: Four times a day (QID) | ORAL | Status: DC | PRN
  Administered 2012-05-13 – 2012-05-14 (×2): 25 mg via ORAL
  Filled 2012-05-13 (×2): qty 1

## 2012-05-13 NOTE — Progress Notes (Signed)
[facility] PROGRESS NOTE  [name]:[id number] DOB: [birth year] DOA: 05/12/2012 PCP: [name], MD  Assessment/Plan:  Nausea and vomiting - DM gastroparesis versus gastroenteritis versus GERD  -Resolving patient tolerating diabetic diet. -UA neg, Also obtain lipase and follow.   Hyperglycemia/DM (diabetes mellitus), type 1, uncontrolled  - IV insulin discontinued  -Currently on Lantus and SSI with meal coverage -Precipitated by #1 above. - Patient Bicarb 15 on BMP - Anion gap of 9. Patient eating and comfortable - evaluate serum/urine ketones  Code Status: full Family Communication: None at bedside. Disposition Plan: Pending continued improvement in condition. Nausea resolved and patient currently torelating po intake.  Making sure acidosis/ketosis has resolved or is resolving prior to discharge.   Consultants:  none  Procedures:  none  Antibiotics:  None  HPI/Subjective: No new complaints at this juncture. No acute issues overnight.  Objective: Filed Vitals:   05/12/12 1946 05/12/12 2301 05/13/12 0531 05/13/12 1418  BP: 99/56 96/65 95/59  95/62  Pulse: 115 104 105 92  Temp: 98.2 F (36.8 C) 99.4 F (37.4 C) 98 F (36.7 C) 98.4 F (36.9 C)  TempSrc: Oral Oral Oral Oral  Resp: 20 18 18 16   Height:      Weight:      SpO2: 99% 99% 98% 99%    Intake/Output Summary (Last 24 hours) at 05/13/12 1557 Last data filed at 05/13/12 1500  Gross per 24 hour  Intake 2997.4 ml  Output      0 ml  Net 2997.4 ml   Filed Weights   05/12/12 1913  Weight: 48.535 kg (107 lb)    Exam:   General:  Pt in NAD, Alert and Oriented x 3 non toxic appearing  Cardiovascular:  RRR, No MRG  Respiratory: CTA BL, no wheezes  Abdomen: soft, NT, ND  Musculoskeletal: no cyanosis or clubbing   Data Reviewed: Basic Metabolic Panel:  Recent Labs Lab 05/12/12 1817 05/13/12 0515 05/13/12 1401  NA 134* 136 134*  K 3.9 3.5 3.2*  CL 97 109 110  CO2  20 15* 15*  GLUCOSE 325* 138* 159*  BUN 23 18 11   CREATININE 0.66 0.55 0.49*  CALCIUM 8.9 7.6* 7.1*   Liver Function Tests:  Recent Labs Lab 05/12/12 1817  AST 19  ALT 21  ALKPHOS 66  BILITOT 0.4  PROT 7.8  ALBUMIN 3.9    Recent Labs Lab 05/12/12 2358  LIPASE 16   No results found for this basename: AMMONIA,  in the last 168 hours CBC:  Recent Labs Lab 05/12/12 1817  WBC 7.6  HGB 13.5  HCT 40.5  MCV 91.8  PLT 240   Cardiac Enzymes: No results found for this basename: CKTOTAL, CKMB, CKMBINDEX, TROPONINI,  in the last 168 hours BNP (last 3 results) No results found for this basename: PROBNP,  in the last 8760 hours CBG:  Recent Labs Lab 05/13/12 0509 05/13/12 0601 05/13/12 0655 05/13/12 0725 05/13/12 1137  GLUCAP 124* 119* 143* 167* 73    No results found for this or any previous visit (from the past 240 hour(s)).   Studies: Dg Abd Acute W/chest  05/12/2012  *RADIOLOGY REPORT*  Clinical Data: Abdominal pain.  ACUTE ABDOMEN SERIES (ABDOMEN 2 VIEW & CHEST 1 VIEW)  Comparison: 04/28/2012 and prior chest radiographs.  04/20/2011 abdominal radiograph  Findings: The cardiomediastinal silhouette is unremarkable.  The lungs are clear. There is no evidence of airspace disease, pleural effusion or pneumothorax. Clips in the region of the thyroid are again  noted.  Nondistended gas and stool filled colon and rectum noted. A few nondistended fluid and gas-filled loops of small bowel are present with fluid. The stomach contains fluid. There is no evidence of bowel obstruction or pneumoperitoneum. No suspicious calcifications are present.  IMPRESSION: Nonspecific nonobstructive bowel gas pattern - which can be seen with gastroenteritis.  No evidence of pneumoperitoneum.  No evidence of active cardiopulmonary disease.   Original Report Authenticated By: Harmon Pier, M.D.     Scheduled Meds: . insulin aspart  0-5 Units Subcutaneous QHS  . insulin aspart  0-9 Units  Subcutaneous TID WC  . insulin glargine  10 Units Subcutaneous QHS  . metoCLOPramide  5 mg Oral Q6H  . multivitamin with minerals  1 tablet Oral Daily  . pantoprazole (PROTONIX) IV  40 mg Intravenous Q12H  . vitamin C  500 mg Oral q morning - 10a   Continuous Infusions: . sodium chloride 150 mL/hr at 05/13/12 1500    Active Problems:   Nausea and vomiting   DM gastroparesis   Hyperglycemia   DM (diabetes mellitus), type 1, uncontrolled    Time spent: > 35 minutes    Penny Pia  [facility] Pager [phone]. If 7PM-7AM, please contact night-coverage at [email or web address], password [email or web address] 05/13/2012, 3:57 PM  LOS: 1 day

## 2012-05-14 DIAGNOSIS — E1010 Type 1 diabetes mellitus with ketoacidosis without coma: Secondary | ICD-10-CM

## 2012-05-14 LAB — BASIC METABOLIC PANEL
CO2: 18 mEq/L — ABNORMAL LOW (ref 19–32)
Chloride: 109 mEq/L (ref 96–112)
GFR calc Af Amer: >90 mL/min (ref >90)
Potassium: 4.2 mEq/L (ref 3.5–5.1)

## 2012-05-14 LAB — GLUCOSE, CAPILLARY
Glucose-Capillary: 214 mg/dL — ABNORMAL HIGH (ref 70–99)
Glucose-Capillary: 144 mg/dL — ABNORMAL HIGH (ref 70–99)
Glucose-Capillary: 209 mg/dL — ABNORMAL HIGH (ref 70–99)

## 2012-05-14 MED ORDER — INSULIN ASPART 100 UNIT/ML ~~LOC~~ SOLN: 5.0000 [IU] | Freq: Three times a day (TID) | SUBCUTANEOUS | Status: DC

## 2012-05-14 MED ORDER — ONDANSETRON HCL 4 MG PO TABS: 8.0000 mg | ORAL_TABLET | Freq: Four times a day (QID) | ORAL | Status: DC | PRN

## 2012-05-14 MED ORDER — OXYCODONE-ACETAMINOPHEN 10-325 MG PO TABS: 1.0000 | ORAL_TABLET | ORAL | Status: DC | PRN

## 2012-05-14 MED ORDER — METOCLOPRAMIDE HCL 5 MG PO TABS: 5.0000 mg | ORAL_TABLET | Freq: Three times a day (TID) | ORAL | Status: DC

## 2012-05-14 NOTE — Progress Notes (Signed)
Physician Discharge Summary   [name] MRN:[id number] DOB: 05/07/1980 DOA: 05/12/2012  PCP: MITCHELL,LEWIS DEAN, MD  Admit date: 05/12/2012  Discharge date: 05/14/2012  Time spent: 3 minutes  Recommendations for Outpatient Follow-up:  1. PCP in 1week -have Bmet done on follow up  Discharge Diagnoses:  Principal Problem:  DM gastroparesis  Active Problems:  Nausea and vomiting  Hyperglycemia  DM (diabetes mellitus), type 1, uncontrolled  Hypokalemia   Discharge Condition: improved/stable  Diet recommendation: modified carb  Filed Weights    05/12/12 1913   Weight:  48.535 kg (107 lb)    History of present illness:  [name] is a 31 y.o. female GERD, diabetic gastroparesis,diabetes mellitus type 1 with prior admissions for DKA last discharge 3/9 presents with nausea vomiting for x1, nonbloody. She states that when she checked her sugars were elevated and so she called EMS and per EMS her sugars were in the 400s.She reports that Her child has had a gastrointestinal viral illness with nausea vomiting, and she began having nausea or vomiting as well with abdominal cramping. She denies diarrhea, dysuria, cough, fever melena and no hematochezia. Upon arrival in the ED her blood glucose was 325, with a CO2 of 20, and UA with greater than 80 ketones. she was started on the glucostablizer and is admitted for further evaluation and management.  Hospital Course:  Nausea and vomiting - DM gastroparesis versus gastroenteritis versus GERD  -Resolved patient tolerating diabetic diet.  -will d/c on reglan, follow up with PCP  -UA neg, lipase nl at 16.  Hyperglycemia/DM (diabetes mellitus), type 1, uncontrolled/DKA  -Was initially placed on IV insulin discontinued and once she met criteria she was switched to Lantus and SSI with meal coverage  -Precipitated by #1 above.  - Patient Bicarb 15 per BMP on 3/22 and now improved to 18, and clinically improved  -She is eating and comfortable   -she is to follow up with PCP  Procedures:  none Consultations:  none Discharge Exam:  Filed Vitals:    05/13/12 0531  05/13/12 1418  05/13/12 2200  05/14/12 0600   BP:  95/59  95/62  110/74  100/68   Pulse:  105  92  91  85   Temp:  98 F (36.7 C)  98.4 F (36.9 C)  97.8 F (36.6 C)  98.3 F (36.8 C)   TempSrc:  Oral  Oral  Oral  Oral   Resp:  18  16  18  18   Height:       Weight:       SpO2:  98%  99%  100%  100%    Exam:  General: Pt in NAD, Alert and Oriented x 3 non toxic appearing  Cardiovascular: RRR, No MRG  Respiratory: CTA BL, no wheezes  Abdomen: soft, decreased tenderness, ND  Musculoskeletal: no cyanosis or clubbing  Discharge Instructions  Discharge Orders    Future Orders  Complete By  Expires     Diet Carb Modified  As directed      Increase activity slowly  As directed          Medication List     STOP taking these medications       insulin aspart 100 UNIT/ML injection    Commonly known as: novoLOG     TAKE these medications       insulin glargine 100 UNIT/ML injection    Commonly known as: LANTUS    Inject 16 Units into the   skin at bedtime.    metoCLOPramide 5 MG tablet    Commonly known as: REGLAN    Take 1 tablet (5 mg total) by mouth 3 (three) times daily before meals.    multivitamin with minerals Tabs    Take 1 tablet by mouth daily. Diabetic support vitamin    niacin 500 MG tablet    Take 1 tablet (500 mg total) by mouth at bedtime.    ondansetron 4 MG tablet    Commonly known as: ZOFRAN    Take 2 tablets (8 mg total) by mouth every 6 (six) hours as needed for nausea.    oxyCODONE-acetaminophen 10-325 MG per tablet    Commonly known as: PERCOCET    Take 1 tablet by mouth every 4 (four) hours as needed for pain.    vitamin C 500 MG tablet    Commonly known as: ASCORBIC ACID    Take 500 mg by mouth every morning.     CONTINUE NOVOLOG AS PREVIOUSLY    The results of significant diagnostics from this hospitalization (including  imaging, microbiology, ancillary and laboratory) are listed below for reference.   Significant Diagnostic Studies:  Dg Chest 2 View  04/15/2012 *RADIOLOGY REPORT* Clinical Data: Nausea, abdominal pain, sore throat. History of diabetes. Ex-smoker. CHEST - 2 VIEW Comparison: 02/03/2012 Findings: The heart size and pulmonary vascularity are normal. The lungs appear clear and expanded without focal air space disease or consolidation. No blunting of the costophrenic angles. No pneumothorax. Mediastinal contours appear intact. Surgical clips in the base of the neck. There are metallic piercings over the nipples. There is no significant change since previous study. IMPRESSION: No evidence of active pulmonary disease. Original Report Authenticated By: William Stevens, M.D.  Us Abdomen Complete  04/28/2012 *RADIOLOGY REPORT* Clinical Data: Elevated alkaline phosphatase. ABDOMINAL ULTRASOUND COMPLETE Comparison: CT of the abdomen and pelvis performed 02/06/2012, and abdominal ultrasound performed 05/29/2007 Findings: Gallbladder: The gallbladder is normal in appearance, without evidence for gallstones, gallbladder wall thickening or pericholecystic fluid. No ultrasonographic Murphy's sign is elicited. Common Bile Duct: 0.3 cm in diameter; within normal limits in caliber. Liver: Normal parenchymal echogenicity and echotexture; no focal lesions identified. Limited Doppler evaluation demonstrates normal blood flow within the liver. IVC: Unremarkable in appearance. Pancreas: Not visualized due to overlying bowel gas. Spleen: 10.5 cm in length; within normal limits in size and echotexture. Right kidney: 10.8 cm in length; normal in size and configuration. Minimally increased parenchymal echogenicity is noted. No evidence of mass or hydronephrosis. Left kidney: 10.9 cm in length; normal in size and configuration. Minimally increased parenchymal echogenicity is noted. No evidence of mass or hydronephrosis. Abdominal Aorta: Normal  in caliber; no aneurysm identified. IMPRESSION: 1. No acute abnormality seen within the abdomen. 2. Minimally increased renal parenchymal echogenicity may reflect medical renal disease. Original Report Authenticated By: Jeffrey Chang, M.D.  Ct Abdomen Pelvis W Contrast  04/29/2012 *RADIOLOGY REPORT* Clinical Data: Abdominal pain. History diabetes. Gastroparesis. CT ABDOMEN AND PELVIS WITH CONTRAST Technique: Multidetector CT imaging of the abdomen and pelvis was performed following the standard protocol during bolus administration of intravenous contrast. Contrast: 25mL OMNIPAQUE IOHEXOL 300 MG/ML SOLN, 100mL OMNIPAQUE IOHEXOL 300 MG/ML SOLN, 1 OMNIPAQUE IOHEXOL 300 MG/ML SOLN Comparison: 04/28/2012 abdominal ultrasound. Prior CT of 02/06/2012. Findings: Lung bases: Mild bibasilar atelectasis. Normal heart size with trace bilateral pleural fluid. Abdomen/pelvis: Hepatomegaly, 20.8 cm cranial caudal. No focal liver lesion. Old granulomas disease in the spleen. Normal stomach. Mild pancreatic atrophy. Gallbladder partially contracted, but normal in   appearance on prior ultrasound. Normal biliary tract. Normal adrenal glands. Normal kidneys. No retroperitoneal or retrocrural adenopathy. Normal colon, appendix, and terminal ileum. Normal small bowel without abdominal ascites. Anasarca. Nonspecific air in the left inguinal region on image 80/series 2. No pelvic adenopathy. Air within urinary bladder. Normal uterus, without adnexal mass. Trace free pelvic fluid is likely physiologic. Bones/Musculoskeletal: No acute osseous abnormality. IMPRESSION: 1. No acute process in the abdomen or pelvis. 2. Trace cul-de-sac fluid is likely physiologic. 3. Air within urinary bladder. Correlate with recent instrumentation. 4. Trace bilateral pleural fluid. 5. Anasarca. Nonspecific subcutaneous air in the left inguinal region. Correlate with prior instrumentation or injections. 6. Hepatomegaly. Original Report Authenticated By: Kyle  Talbot, M.D.  Dg Chest Port 1 View  04/28/2012 *RADIOLOGY REPORT* Clinical Data: Abdominal pain, diabetes PORTABLE CHEST - 1 VIEW Comparison: Plain film 04/15/2012 Findings: Normal mediastinum and cardiac silhouette. Normal pulmonary vasculature. No evidence of effusion, infiltrate, or pneumothorax. No acute bony abnormality. Vascular clips noted in the thyroid bed. IMPRESSION: No acute cardiopulmonary process. Original Report Authenticated By: Stewart Edmunds, M.D.  Dg Abd Acute W/chest  05/12/2012 *RADIOLOGY REPORT* Clinical Data: Abdominal pain. ACUTE ABDOMEN SERIES (ABDOMEN 2 VIEW & CHEST 1 VIEW) Comparison: 04/28/2012 and prior chest radiographs. 04/20/2011 abdominal radiograph Findings: The cardiomediastinal silhouette is unremarkable. The lungs are clear. There is no evidence of airspace disease, pleural effusion or pneumothorax. Clips in the region of the thyroid are again noted. Nondistended gas and stool filled colon and rectum noted. A few nondistended fluid and gas-filled loops of small bowel are present with fluid. The stomach contains fluid. There is no evidence of bowel obstruction or pneumoperitoneum. No suspicious calcifications are present. IMPRESSION: Nonspecific nonobstructive bowel gas pattern - which can be seen with gastroenteritis. No evidence of pneumoperitoneum. No evidence of active cardiopulmonary disease. Original Report Authenticated By: Jeffrey Hu, M.D.  Microbiology:  No results found for this or any previous visit (from the past 240 hour(s)).  Labs:  Basic Metabolic Panel:   Recent Labs  Lab  05/12/12 1817  05/13/12 0515  05/13/12 1401  05/14/12 1201   NA  134*  136  134*  135   K  3.9  3.5  3.2*  4.2   CL  97  109  110  109   CO2  20  15*  15*  18*   GLUCOSE  325*  138*  159*  233*   BUN  23  18  11  8   CREATININE  0.66  0.55  0.49*  0.47*   CALCIUM  8.9  7.6*  7.1*  8.1*    Liver Function Tests:   Recent Labs  Lab  05/12/12 1817   AST  19   ALT  21    ALKPHOS  66   BILITOT  0.4   PROT  7.8   ALBUMIN  3.9     Recent Labs  Lab  05/12/12 2358   LIPASE  16    No results found for this basename: AMMONIA, in the last 168 hours  CBC:   Recent Labs  Lab  05/12/12 1817   WBC  7.6   HGB  13.5   HCT  40.5   MCV  91.8   PLT  240    Cardiac Enzymes:  No results found for this basename: CKTOTAL, CKMB, CKMBINDEX, TROPONINI, in the last 168 hours  BNP:  BNP (last 3 results)  No results found for this basename: PROBNP, in the last   8760 hours  CBG:   Recent Labs  Lab  05/13/12 1137  05/13/12 1722  05/13/12 2139  05/14/12 0721  05/14/12 1132   GLUCAP  73  264*  214*  144*  209*    Signed:  Daiya Tamer C  [facility]  05/14/2012, 12:55 PM  

## 2012-05-14 NOTE — Care Management Utilization Note (Signed)
UR completed 

## 2012-05-14 NOTE — Progress Notes (Signed)
DC instructions gone over with patient.  Rx for Vicodin, Reglan and Zofran given.  Patient verbalizes understanding of home insulin regimen.  Questions answered.  Patient transported to front of hospital via wheelchair by nurse tech and friend.

## 2012-06-01 NOTE — Discharge Summary (Signed)
Physician Discharge Summary   [name] YNW:[id number] DOB: August 01, 1980 DOA: 05/12/2012  PCP: [name], MD  Admit date: 05/12/2012  Discharge date: 05/14/2012  Time spent: 3 minutes  Recommendations for Outpatient Follow-up:  1. PCP in 1week -have Bmet done on follow up  Discharge Diagnoses:  Principal Problem:  DM gastroparesis  Active Problems:  Nausea and vomiting  Hyperglycemia  DM (diabetes mellitus), type 1, uncontrolled  Hypokalemia   Discharge Condition: improved/stable  Diet recommendation: modified carb  Filed Weights    05/12/12 1913   Weight:  48.535 kg (107 lb)    History of present illness:  [name] is a 32 y.o. female GERD, diabetic gastroparesis,diabetes mellitus type 1 with prior admissions for DKA last discharge 3/9 presents with nausea vomiting for x1, nonbloody. She states that when she checked her sugars were elevated and so she called EMS and per EMS her sugars were in the 400s.She reports that Her child has had a gastrointestinal viral illness with nausea vomiting, and she began having nausea or vomiting as well with abdominal cramping. She denies diarrhea, dysuria, cough, fever melena and no hematochezia. Upon arrival in the ED her blood glucose was 325, with a CO2 of 20, and UA with greater than 80 ketones. she was started on the glucostablizer and is admitted for further evaluation and management.  Hospital Course:  Nausea and vomiting - DM gastroparesis versus gastroenteritis versus GERD  -Resolved patient tolerating diabetic diet.  -will d/c on reglan, follow up with PCP  -UA neg, lipase nl at 16.  Hyperglycemia/DM (diabetes mellitus), type 1, uncontrolled/DKA  -Was initially placed on IV insulin discontinued and once she met criteria she was switched to Lantus and SSI with meal coverage  -Precipitated by #1 above.  - Patient Bicarb 15 per BMP on 3/22 and now improved to 18, and clinically improved  -She is eating and comfortable   -she is to follow up with PCP  Procedures:  none Consultations:  none Discharge Exam:  Filed Vitals:    05/13/12 0531  05/13/12 1418  05/13/12 2200  05/14/12 0600   BP:  95/59  95/62  110/74  100/68   Pulse:  105  92  91  85   Temp:  98 F (36.7 C)  98.4 F (36.9 C)  97.8 F (36.6 C)  98.3 F (36.8 C)   TempSrc:  Oral  Oral  Oral  Oral   Resp:  18  16  18  18    Height:       Weight:       SpO2:  98%  99%  100%  100%    Exam:  General: Pt in NAD, Alert and Oriented x 3 non toxic appearing  Cardiovascular: RRR, No MRG  Respiratory: CTA BL, no wheezes  Abdomen: soft, decreased tenderness, ND  Musculoskeletal: no cyanosis or clubbing  Discharge Instructions  Discharge Orders    Future Orders  Complete By  Expires     Diet Carb Modified  As directed      Increase activity slowly  As directed          Medication List     STOP taking these medications       insulin aspart 100 UNIT/ML injection    Commonly known as: novoLOG     TAKE these medications       insulin glargine 100 UNIT/ML injection    Commonly known as: LANTUS    Inject 16 Units into the  skin at bedtime.    metoCLOPramide 5 MG tablet    Commonly known as: REGLAN    Take 1 tablet (5 mg total) by mouth 3 (three) times daily before meals.    multivitamin with minerals Tabs    Take 1 tablet by mouth daily. Diabetic support vitamin    niacin 500 MG tablet    Take 1 tablet (500 mg total) by mouth at bedtime.    ondansetron 4 MG tablet    Commonly known as: ZOFRAN    Take 2 tablets (8 mg total) by mouth every 6 (six) hours as needed for nausea.    oxyCODONE-acetaminophen 10-325 MG per tablet    Commonly known as: PERCOCET    Take 1 tablet by mouth every 4 (four) hours as needed for pain.    vitamin C 500 MG tablet    Commonly known as: ASCORBIC ACID    Take 500 mg by mouth every morning.     CONTINUE NOVOLOG AS PREVIOUSLY    The results of significant diagnostics from this hospitalization (including  imaging, microbiology, ancillary and laboratory) are listed below for reference.   Significant Diagnostic Studies:  Dg Chest 2 View  04/15/2012 *RADIOLOGY REPORT* Clinical Data: Nausea, abdominal pain, sore throat. History of diabetes. Ex-smoker. CHEST - 2 VIEW Comparison: 02/03/2012 Findings: The heart size and pulmonary vascularity are normal. The lungs appear clear and expanded without focal air space disease or consolidation. No blunting of the costophrenic angles. No pneumothorax. Mediastinal contours appear intact. Surgical clips in the base of the neck. There are metallic piercings over the nipples. There is no significant change since previous study. IMPRESSION: No evidence of active pulmonary disease. Original Report Authenticated By: Burman Nieves, M.D.  US Abdomen Complete  04/28/2012 *RADIOLOGY REPORT* Clinical Data: Elevated alkaline phosphatase. ABDOMINAL ULTRASOUND COMPLETE Comparison: CT of the abdomen and pelvis performed 02/06/2012, and abdominal ultrasound performed 05/29/2007 Findings: Gallbladder: The gallbladder is normal in appearance, without evidence for gallstones, gallbladder wall thickening or pericholecystic fluid. No ultrasonographic Murphy's sign is elicited. Common Bile Duct: 0.3 cm in diameter; within normal limits in caliber. Liver: Normal parenchymal echogenicity and echotexture; no focal lesions identified. Limited Doppler evaluation demonstrates normal blood flow within the liver. IVC: Unremarkable in appearance. Pancreas: Not visualized due to overlying bowel gas. Spleen: 10.5 cm in length; within normal limits in size and echotexture. Right kidney: 10.8 cm in length; normal in size and configuration. Minimally increased parenchymal echogenicity is noted. No evidence of mass or hydronephrosis. Left kidney: 10.9 cm in length; normal in size and configuration. Minimally increased parenchymal echogenicity is noted. No evidence of mass or hydronephrosis. Abdominal Aorta: Normal  in caliber; no aneurysm identified. IMPRESSION: 1. No acute abnormality seen within the abdomen. 2. Minimally increased renal parenchymal echogenicity may reflect medical renal disease. Original Report Authenticated By: Tonia Ghent, M.D.  Ct Abdomen Pelvis W Contrast  04/29/2012 *RADIOLOGY REPORT* Clinical Data: Abdominal pain. History diabetes. Gastroparesis. CT ABDOMEN AND PELVIS WITH CONTRAST Technique: Multidetector CT imaging of the abdomen and pelvis was performed following the standard protocol during bolus administration of intravenous contrast. Contrast: 25mL OMNIPAQUE IOHEXOL 300 MG/ML SOLN, OMNIPAQUE IOHEXOL 300 MG/ML SOLN, 1 OMNIPAQUE IOHEXOL 300 MG/ML SOLN Comparison: 04/28/2012 abdominal ultrasound. Prior CT of 02/06/2012. Findings: Lung bases: Mild bibasilar atelectasis. Normal heart size with trace bilateral pleural fluid. Abdomen/pelvis: Hepatomegaly, 20.8 cm cranial caudal. No focal liver lesion. Old granulomas disease in the spleen. Normal stomach. Mild pancreatic atrophy. Gallbladder partially contracted, but normal in  appearance on prior ultrasound. Normal biliary tract. Normal adrenal glands. Normal kidneys. No retroperitoneal or retrocrural adenopathy. Normal colon, appendix, and terminal ileum. Normal small bowel without abdominal ascites. Anasarca. Nonspecific air in the left inguinal region on image 80/series 2. No pelvic adenopathy. Air within urinary bladder. Normal uterus, without adnexal mass. Trace free pelvic fluid is likely physiologic. Bones/Musculoskeletal: No acute osseous abnormality. IMPRESSION: 1. No acute process in the abdomen or pelvis. 2. Trace cul-de-sac fluid is likely physiologic. 3. Air within urinary bladder. Correlate with recent instrumentation. 4. Trace bilateral pleural fluid. 5. Anasarca. Nonspecific subcutaneous air in the left inguinal region. Correlate with prior instrumentation or injections. 6. Hepatomegaly. Original Report Authenticated By: Jeronimo Greaves, M.D.  Dg Chest Port 1 View  04/28/2012 *RADIOLOGY REPORT* Clinical Data: Abdominal pain, diabetes PORTABLE CHEST - 1 VIEW Comparison: Plain film 04/15/2012 Findings: Normal mediastinum and cardiac silhouette. Normal pulmonary vasculature. No evidence of effusion, infiltrate, or pneumothorax. No acute bony abnormality. Vascular clips noted in the thyroid bed. IMPRESSION: No acute cardiopulmonary process. Original Report Authenticated By: Genevive Bi, M.D.  Dg Abd Acute W/chest  05/12/2012 *RADIOLOGY REPORT* Clinical Data: Abdominal pain. ACUTE ABDOMEN SERIES (ABDOMEN 2 VIEW & CHEST 1 VIEW) Comparison: 04/28/2012 and prior chest radiographs. 04/20/2011 abdominal radiograph Findings: The cardiomediastinal silhouette is unremarkable. The lungs are clear. There is no evidence of airspace disease, pleural effusion or pneumothorax. Clips in the region of the thyroid are again noted. Nondistended gas and stool filled colon and rectum noted. A few nondistended fluid and gas-filled loops of small bowel are present with fluid. The stomach contains fluid. There is no evidence of bowel obstruction or pneumoperitoneum. No suspicious calcifications are present. IMPRESSION: Nonspecific nonobstructive bowel gas pattern - which can be seen with gastroenteritis. No evidence of pneumoperitoneum. No evidence of active cardiopulmonary disease. Original Report Authenticated By: Harmon Pier, M.D.  Microbiology:  No results found for this or any previous visit (from the past 240 hour(s)).  Labs:  Basic Metabolic Panel:   Recent Labs  Lab  05/12/12 1817  05/13/12 0515  05/13/12 1401  05/14/12 1201   NA  134*  136  134*  135   K  3.9  3.5  3.2*  4.2   CL  97  109  110  109   CO2  20  15*  15*  18*   GLUCOSE  325*  138*  159*  233*   BUN  23  18  11  8    CREATININE  0.66  0.55  0.49*  0.47*   CALCIUM  8.9  7.6*  7.1*  8.1*    Liver Function Tests:   Recent Labs  Lab  05/12/12 1817   AST  19   ALT  21    ALKPHOS  66   BILITOT  0.4   PROT  7.8   ALBUMIN  3.9     Recent Labs  Lab  05/12/12 2358   LIPASE  16    No results found for this basename: AMMONIA, in the last 168 hours  CBC:   Recent Labs  Lab  05/12/12 1817   WBC  7.6   HGB  13.5   HCT  40.5   MCV  91.8   PLT  240    Cardiac Enzymes:  No results found for this basename: CKTOTAL, CKMB, CKMBINDEX, TROPONINI, in the last 168 hours  BNP:  BNP (last 3 results)  No results found for this basename: PROBNP, in the last  8760 hours  CBG:   Recent Labs  Lab  05/13/12 1137  05/13/12 1722  05/13/12 2139  05/14/12 0721  05/14/12 1132   GLUCAP  73  264*  214*  144*  209*    Signed:  Labarron Durnin C  [facility]  05/14/2012, 12:55 PM

## 2012-08-18 LAB — HEPATIC FUNCTION PANEL
ALT: 18 U/L (ref 7–35)
AST: 25 U/L (ref 13–35)

## 2012-08-18 LAB — HEMOGLOBIN A1C: Hgb A1c MFr Bld: 12.2 % — AB (ref 4.0–6.0)

## 2012-08-18 LAB — CBC AND DIFFERENTIAL
Hemoglobin: 13 g/dL (ref 12.0–16.0)
WBC: 9.2 10^3/mL

## 2012-08-18 LAB — BASIC METABOLIC PANEL
BUN: 22 mg/dL — AB (ref 4–21)
Creatinine: 1 mg/dL (ref 0.5–1.1)
Glucose: 617 mg/dL
Potassium: 5.1 mmol/L (ref 3.4–5.3)

## 2012-08-18 LAB — LIPID PANEL: Cholesterol: 256 mg/dL — AB (ref 0–200)

## 2012-09-07 ENCOUNTER — Encounter: Payer: Self-pay | Admitting: Internal Medicine

## 2012-09-14 ENCOUNTER — Encounter: Payer: Self-pay | Admitting: Internal Medicine

## 2012-09-14 ENCOUNTER — Telehealth: Payer: Self-pay | Admitting: *Deleted

## 2012-09-14 ENCOUNTER — Ambulatory Visit (INDEPENDENT_AMBULATORY_CARE_PROVIDER_SITE_OTHER): Payer: Medicaid Other | Admitting: Internal Medicine

## 2012-09-14 VITALS — BP 98/64 | HR 78 | Temp 98.3°F | Ht 66.0 in | Wt 135.0 lb

## 2012-09-14 DIAGNOSIS — E1065 Type 1 diabetes mellitus with hyperglycemia: Secondary | ICD-10-CM

## 2012-09-14 MED ORDER — INSULIN GLARGINE 100 UNIT/ML ~~LOC~~ SOLN: 18.0000 [IU] | Freq: Every day | SUBCUTANEOUS | Status: DC

## 2012-09-14 MED ORDER — INSULIN ASPART 100 UNIT/ML ~~LOC~~ SOLN: 6.0000 [IU] | Freq: Three times a day (TID) | SUBCUTANEOUS | Status: DC

## 2012-09-14 MED ORDER — "PEN NEEDLES 3/16"" 31G X 5 MM MISC": 1.0000 | Freq: Four times a day (QID) | Status: DC

## 2012-09-14 MED ORDER — GLUCAGON (RDNA) 1 MG IJ KIT: 1.0000 mg | PACK | Freq: Once | INTRAMUSCULAR | Status: DC | PRN

## 2012-09-14 NOTE — Telephone Encounter (Signed)
I meant to send pen needles, too but I forgot. Will send.

## 2012-09-14 NOTE — Telephone Encounter (Signed)
Rx for pen needles sent to [facility]

## 2012-09-14 NOTE — Patient Instructions (Addendum)
Please return in 2-3 weeks with your sugar log. Decrease Lantus to 18 units. Increase mealtime Novolog to 6 units. Keep the correction scale the same: - 200-300: + 1 unit - 301-400: + 2 units - >400: + 3 units Do not inject NovoLog sliding scale after meals, if possible, but check sugar more often if high sugars 2h after meals.  Basic Rules for Patients with Type I Diabetes Mellitus  1. The American Diabetes Association (ADA) recommended targets: - fasting sugar <130 - after meal sugar <180 - HbA1C <7%  2. Engage in ?150 min moderate exercise per week  3. Make sure you have ?8h of sleep every night as this helps both blood sugars and your weight.  4. Always keep a sugar log (not only record in your meter) and bring it to all appointments with [place].  5. "15-15 rule" for hypoglycemia: if sugars are low, take 15 g of carbs** ("fast sugar" - e.g. 4 glucose tablets, 4 oz orange juice), wait 15 min, then check sugars again. If still <80, repeat. Continue  until your sugars >80, then eat a normal meal.   6. Teach family members and coworkers to inject glucagon. Have a glucagon set at home and one at work. They should call 911 after using the set.  7. Check sugar before driving. If <100, correct, and only start driving if sugars rise ?098. Check sugar every hour when on a long drive.  8. Check sugar before exercising. If <100, correct, and only start exercising if sugars rise ?100. Check sugar every hour when on a long exercise routine and 1h after you finished exercising.   If >250, check urine for ketones. If you have moderate-large ketones in urine, do not start exercise. Hydrate yourself with clear liquids and correct the high sugar. Recheck sugars and ketones before attempting to exercise.  Be aware that you might need less insulin when exercising.  *intense, short, exercise bursts can increase your sugars, but  *less intense, longer (>1h), exercise routines can decrease your sugars.   If you are on a pump, you might need to decrease your basal rate by 10% or more (or even disconnect your pump) while you exercise to prevent low sugars. Do not disconnect your pump by more than 3 hours at a time! You also might need to decrease your insulin bolus for the meal prior to your exercise time by 20% or more.  9. Make sure you have a MedAlert bracelet or pendant mentioning "Type I Diabetes Mellitus". If you have a prior episode of severe hypoglycemia or hypoglycemia unawareness, it should also mention this.  10. Please do not walk barefoot. Inspect your feet for sores/cuts and let us know if you have them.  11. Please call [facility] with any questions and concerns [phone]).   **E.g. of "fast carbs":   first choice (15 g):  1 tube glucose gel, GlucoPouch 15, 2 oz glucose liquid   second choice (15-16 g):  3 or 4 glucose tablets (best taken  with water), 15 Dextrose Bits chewable   third choice (15-20 g):   cup fruit juice,  cup regular soda, 1 cup skim milk,  1 cup sports drink   fourth choice (15-20 g):  1 small tube [place] gel (not frosting), 2 tbsp raisins, 1 tbsp table sugar,  candy, jelly beans, gum drops - check package for carb amount   (adapted from: Juluis Rainier. "Insulin therapy and hypoglycemia" Endocrinol Metab Clin N Am 2012, 41: 57-87)

## 2012-09-14 NOTE — Progress Notes (Signed)
Patient ID: [name], female   DOB: [birth year], 32 y.o.   MRN: [id number]  HPI: [name] is a 32 y.o.-year-old female, referred from the ED department, for management of DM1, uncontrolled, with complications (hyperglycemia +/- DKA + coma, severe hypoglycemia, gastroparesis, peripheral neuropathy). Now 2 mo after hospitalization, longest she has stayed out of hospital for the last 2 years.    Patient has been diagnosed with diabetes in 2005. Revieweing her chart, she has been admitted multiple times, with glucose levels up to 1013 and CO2 down to <5.  Last hemoglobin A1c was: Lab Results  Component Value Date   HGBA1C 12.2* 08/18/2012  Previously: Component     Latest Ref Rng 06/29/2010 10/20/2011 04/28/2012  Hemoglobin A1C     4.0 - 6.0 % 7.4 (H) . . . 10.2 (H) 13.8 (H)  Mean Plasma Glucose     <117 mg/dL 409 (H) 811 (H) 914 (H)   She was on an insulin pump: Omnipod x 2 years - she liked it much better than the insulin injection, but in last 14 mo, she went to the hospital 18x. She was advised to stop it >> she cam off the pump 5 mo ago. Had first DKA episode 2 years ago while in early labor.   Pt is on a regimen of: - Lantus 22 units qhs - Novolog 5 units tid ac - SSi - target 200: +1 units, 300: +2 units, etc, 1 unit for 1 carb choice (15 g carbs)  She recalled the settings of her previous Omnipod pump: - Insulin to carb ratio 6 - Target 100 - sensitivity factor 65 - ? Basal rates  Pt checks her sugars 5-6 a day and they are: - am: 60-180 - before lunch: 200s - before dinner: too high to read - bedtime: - Has lows: 18 x1 8 mo ago from overbolusing. Lowest sugar was 60 since last hospital d/c; she has hypoglycemia awareness at 180. Highest sugar was HI. She tells me that her 29 year old son saved her life twice, by performing CPR or injecting glucagon.  Pt's meals are: - Breakfast: usually fruit and milk, nutrigrain bar or oatmeal, glucerna - Lunch: raw veggies and  fruit, chicken/steak/fish, pasta/beans/corn - Dinner: same as lunch - Snacks: Doritos - handful - 2x a day, raw fruit or veggies, 2-3x glucerna a day!  For exercise, she walks 60 minutes daily.   Pt does not have chronic kidney disease, last BUN/creatinine was:  Lab Results  Component Value Date   BUN 22* 08/18/2012   CREATININE 1.0 08/18/2012  She is on Lisinopril.  Last set of lipids: Lab Results  Component Value Date   CHOL 256* 08/18/2012   HDL 31* 08/18/2012   LDLCALC UNABLE TO CALCULATE IF TRIGLYCERIDE OVER 400 mg/dL 08/30/2954   TRIG 213* 0/09/6576   CHOLHDL 5.4 04/28/2012   Pt's last eye exam was in 08/2011. No DR. Has appt tomorrow. Has numbness and tingling in her feet/hands - on neurontin, started 2 weeks ago. She has nausea - gastroparesis.   I reviewed her chart and she also has a history of anxiety, has irregular menstrual periods.  Pt has + FH of DM in [place] and uncle.  ROS: Constitutional: + weight gain and loss, + fatigue, + both subjective hyperthermia/hypothermia, + hot flushes, poor sleep Eyes: + blurry vision, no xerophthalmia ENT: no sore throat, no nodules palpated in throat, no dysphagia/odynophagia, no hoarseness Cardiovascular: + CP/no SOB/no palpitations/+ leg swelling Respiratory: no cough/SOB Gastrointestinal: +  GERD, + N/+ V/no D/C Musculoskeletal: + muscle and joint aches Skin: no rashes, + easy bruising, itching, hair loss Neurological: no tremors/numbness/tingling/dizziness, + HA Psychiatric: no depression/anxiety Burning with urination, excessive urination, nocturia Low libido irregular menstrual cycles Past Medical History  Diagnosis Date  . Diabetes mellitus   . Thyroid disease   . Back pain   . Panic attack   . PONV (postoperative nausea and vomiting)   . Anxiety   . GERD (gastroesophageal reflux disease)   . DM gastroparesis    Past Surgical History  Procedure Laterality Date  . Laparoscopy      age 58  . Back surgery      age 74  .  Tooth extraction     History   Social History  . Marital Status: Single    Spouse Name: [name]    Number of Children: 2: 29 and 8 y/o   Occupational History  . homemaker.   Social History Main Topics  . Smoking status: Former Smoker    Quit date: 2008  . Smokeless tobacco: Former Neurosurgeon  . Alcohol Use: Yes     Comment: social, none now 03/01/2012  . Drug Use: No  . Sexually Active: No   Current Outpatient Prescriptions on File Prior to Visit  Medication Sig Dispense Refill  . Multiple Vitamin (MULTIVITAMIN WITH MINERALS) TABS Take 1 tablet by mouth daily. Diabetic support vitamin      . niacin 500 MG tablet Take 1 tablet (500 mg total) by mouth at bedtime.  30 tablet  1  . ondansetron (ZOFRAN) 4 MG tablet Take 2 tablets (8 mg total) by mouth every 6 (six) hours as needed for nausea.  20 tablet  0  . oxyCODONE-acetaminophen (PERCOCET) 10-325 MG per tablet Take 1 tablet by mouth every 4 (four) hours as needed for pain.  30 tablet  0  . vitamin C (ASCORBIC ACID) 500 MG tablet Take 500 mg by mouth every morning.       . [DISCONTINUED] insulin lispro (HUMALOG) 100 UNIT/ML injection continuous.       No current facility-administered medications on file prior to visit.   Allergies  Allergen Reactions  . Scallops (Shellfish Allergy) Anaphylaxis  . Morphine And Related Itching  . Tylenol (Acetaminophen) Itching   Family History  Problem Relation Age of Onset  . Other Mother   . Other Father    PE: BP 98/64  Pulse 78  Temp(Src) 98.3 F (36.8 C) (Oral)  Ht 5\' 6"  (1.676 m)  Wt 135 lb (61.236 kg)  BMI 21.8 kg/m2 Wt Readings from Last 3 Encounters:  09/14/12 135 lb (61.236 kg)  05/12/12 107 lb (48.535 kg)  04/29/12 117 lb 4.6 oz (53.2 kg)   Constitutional: overweight, in NAD Eyes: PERRLA, EOMI, no exophthalmos ENT: moist mucous membranes, no thyromegaly, no cervical lymphadenopathy Cardiovascular: RRR, No MRG Respiratory: CTA B Gastrointestinal: abdomen soft, NT, ND,  BS+ Musculoskeletal: no deformities, strength intact in all 4 Skin: moist, warm, no rashes Neurological: no tremor with outstretched hands, DTR normal in all 4  ASSESSMENT: 1. DM1, uncontrolled, with complications - h/o severe lows - highly fluctuating CBGs - repeated admissions in the past for DKA/hyperglycemia - gastroparesis - on Zofran prn, now improved - peripheral neuropathy - on Neurontin  PLAN:  1.  Pt with long standing and very uncontrolled DM1, with complications and repeated admissions for DKA, now determined to gain control over her diabetes. She had multiple admissions in the past both on the  pump or on injections. She is not sure why the pump did not work well for her but remembers being told that she is getting too much insulin from boluses than basal... - she has a staircase effect, with sugars increasing throughout the day, which is a sign of insufficient mealtime insulin.  - She also appears to be on too much Lantus as she can drop her sugars from >500 at night to <200 in am!  I therefore advised her to make the following changes: Decrease Lantus to 18 units. Increase mealtime Novolog to 6 units. Keep the correction scale the same: - 200-300: + 1 unit - 301-400: + 2 units - >400: + 3 units Do not inject NovoLog sliding scale after meals, if possible, but check sugar more often if high sugars 2h after meals. - at next visit, I plan to refer her to DM education and start carb counting with hopefully better mealime insulin estimation - I advised her to stop Glucerna, unless she injected for a meal that she ended up vomiting or she cannot eat a main meal 2/2 nausea - we also need a more strict ISF and CBG target, but I would like to first avoid the lows and then work on the highs - I explained that a physiologic insulin regimen will have approximately same doses of basal and bolus per day.  - we discussed that we might be able to go back to the insulin pump with close  supervision, but I would like to check for her compliance and involvement first, for several months. - I sent a Rx for a glucagon kit to her pharmacy  - given sugar log and advised how to fill it and to bring it at next appt - check 5-6 x a day - given foot care handout and explained the principles  - given instructions for hypoglycemia management "15-15 rule"  - given general instructions about DM1 (driving, exercise, CBG targets, etc.) - see pt instruction section - I sent Rx for Lantus, NovoLog in pen form (was using injections) and pen needles to her pharmacy - no labs for today - return in 1 month with sugar log.

## 2012-09-14 NOTE — Telephone Encounter (Signed)
[facility] is calling to find out if the patient is using the Lantus pen and if so they will also need a Rx for pen needles. Please advise.

## 2012-09-25 LAB — HM DIABETES EYE EXAM

## 2012-10-05 ENCOUNTER — Ambulatory Visit: Payer: Medicaid Other | Admitting: Internal Medicine

## 2012-10-10 HISTORY — PX: ROOT CANAL: SHX2363

## 2012-10-16 ENCOUNTER — Encounter: Payer: Self-pay | Admitting: Internal Medicine

## 2012-10-16 ENCOUNTER — Ambulatory Visit (INDEPENDENT_AMBULATORY_CARE_PROVIDER_SITE_OTHER): Payer: Medicaid Other | Admitting: Internal Medicine

## 2012-10-16 VITALS — BP 118/70 | HR 102 | Temp 98.4°F | Resp 10 | Wt 135.0 lb

## 2012-10-16 DIAGNOSIS — E1065 Type 1 diabetes mellitus with hyperglycemia: Secondary | ICD-10-CM

## 2012-10-16 NOTE — Progress Notes (Signed)
Patient ID: [name], female   DOB: [birth year], 32 y.o.   MRN: [id number]  HPI: [name] is a 32 y.o.-year-old woman returning for f/u for DM1, dx 2005, uncontrolled, with complications (hyperglycemia +/- DKA + coma, severe hypoglycemia, gastroparesis, peripheral neuropathy). She has been previously admitted multiple times, with glucose levels up to 1013 and CO2 down to <5, but not in the last 3 months.  Last hemoglobin A1c was: Lab Results  Component Value Date   HGBA1C 12.2* 08/18/2012  Previously: Component     Latest Ref Rng 06/29/2010 10/20/2011 04/28/2012  Hemoglobin A1C     4.0 - 6.0 % 7.4 (H) . . . 10.2 (H) 13.8 (H)  Mean Plasma Glucose     <117 mg/dL 528 (H) 413 (H) 244 (H)   She was on an insulin pump: Omnipod x 2 years - she liked it much better than the insulin injection, but in last 14 mo, she went to the hospital 18x. She was advised to stop it >> she cam off the pump 5 mo ago. Had first DKA episode 2 years ago while in early labor.   Pt is on a regimen of: - Lantus 18 units (decreased from 22) units qhs - Novolog 6 units tid ac, but using mostly 1 unit for 1 carb choice (15 g carbs) - SSi - target 200: +1 units, 300: +2 units, etc  Pt checks her sugars 5-6 a day and they are: - am: 60-180 >> 80-140 - before lunch: 200s >> 200-300s - before dinner: too high to read >> 300-400s - bedtime: >> 300-400s  Has lows, but only 70 and 58 from skipping a meal/snack. ; she has hypoglycemia awareness at 180. Highest sugar was in the 400s. She tells me that her 14 year old son saved her life twice, by performing CPR or injecting glucagon.  For exercise, she walks 60 minutes daily.  Pt does not have chronic kidney disease, last BUN/creatinine was:  Lab Results  Component Value Date   BUN 22* 08/18/2012   CREATININE 1.0 08/18/2012  She is on Lisinopril.  Last set of lipids: Lab Results  Component Value Date   CHOL 256* 08/18/2012   HDL 31* 08/18/2012   LDLCALC UNABLE  TO CALCULATE IF TRIGLYCERIDE OVER 400 mg/dL 0/02/270   TRIG 536* 07/26/4032   CHOLHDL 5.4 04/28/2012   Pt's last eye exam was in 08/2011. No DR. Had an appt 3 weeks ago and cataracts a little worse >> was advised to return in 6 months. Has numbness and tingling in her feet/hands - on neurontin She has nausea - gastroparesis.   She also has a history of anxiety, has irregular menstrual periods.  I reviewed pt's medications, allergies, PMH, social hx, family hx and no changes required, except as mentioned above. She had a root canal surgery a week ago and was on antibiotics.  ROS: Constitutional: + weight gain and loss, + fatigue, + both subjective hyperthermia/hypothermia, + hot flushes, poor sleep Eyes: + blurry vision sometimes, no xerophthalmia ENT: no sore throat, no nodules palpated in throat, no dysphagia/odynophagia, no hoarseness Cardiovascular: + CP- chronic, mostly associated with GERD/no SOB/no palpitations/+ leg swelling Respiratory: no cough/SOB Gastrointestinal: + GERD, + N/+ V/no D/C Musculoskeletal: + muscle and joint aches Skin: no rashes, + itching, hair loss Neurological: no tremors/numbness/tingling/dizziness, + HA Psychiatric: no depression/anxiety Burning with urination, excessive urination, nocturia Low libido  PE: BP 118/70  Pulse 102  Temp(Src) 98.4 F (36.9 C) (Oral)  Resp 10  Wt 135 lb (61.236 kg)  BMI 21.8 kg/m2  SpO2 98% Wt Readings from Last 3 Encounters:  10/16/12 135 lb (61.236 kg)  09/14/12 135 lb (61.236 kg)  05/12/12 107 lb (48.535 kg)   Constitutional: overweight, in NAD Eyes: PERRLA, EOMI, no exophthalmos ENT: moist mucous membranes, no thyromegaly, no cervical lymphadenopathy Cardiovascular: RRR, No MRG Respiratory: CTA B Gastrointestinal: abdomen soft, NT, ND, BS+ Musculoskeletal: no deformities, strength intact in all 4 Skin: moist, warm, no rashes Neurological: no tremor with outstretched hands, DTR normal in all 4  ASSESSMENT: 1.  DM1, uncontrolled, with complications - h/o severe lows - highly fluctuating CBGs - repeated admissions in the past for DKA/hyperglycemia - gastroparesis - on Zofran prn, now improved - peripheral neuropathy - on Neurontin  PLAN:  1.  Pt with long standing and very uncontrolled DM1, with complications and repeated admissions for DKA, now determined to gain control over her diabetes. She was not admitted to the hospital in the last 3 weeks since I last saw her.  Her sugars are a little bit improved (per her report, does not bring log), but they still maintain a staircase effect, with sugars increasing throughout the day, which is a sign of insufficient mealtime insulin. At last visit, she appeared to be on too much Lantus as she can drop her sugars from >500 at night to <200 in am. We decreased the dose of Lantus and increased the meal-time NovoLog At last visit, so that her sugars do not increase to more than 400 now and she does not have severe lows. - however, since her sugars are still high, I advised her to do the following keep Lantus at 18 units, but move it to a.m. (I believe Lantus might be peaking in her after about 12 hours from injection) Increase mealtime Novolog to 7-7-6 units. (this is just reference, since she is using carb choices after she had her visit with Pincus Large in 02/2012) continue the correction scale the same: - 200-300: + 1 unit - 301-400: + 2 units - >400: + 3 units I again advised her to not  inject NovoLog sliding scale after meals - I referred her to nutrition/DM education for a personalized meal plan, and also carb choices/carb counting refresher with hopefully better mealime insulin estimation - I again explained that a physiologic insulin regimen will have approximately same doses of basal and bolus per day.  - given a new sugar log - continue to check 5-6 x a day - no labs for today - return in 1.5 months with sugar log.

## 2012-10-16 NOTE — Patient Instructions (Signed)
Please use 7 units of Novolog with breakfast and lunch and 6 with dinner. Stay on 18 units of Lantus but move it in the morning. Please come back in 1.5 mo with your sugar log.

## 2012-11-06 ENCOUNTER — Ambulatory Visit: Payer: Medicaid Other | Admitting: *Deleted

## 2012-11-27 ENCOUNTER — Ambulatory Visit (INDEPENDENT_AMBULATORY_CARE_PROVIDER_SITE_OTHER): Payer: Medicaid Other | Admitting: Internal Medicine

## 2012-11-27 ENCOUNTER — Encounter: Payer: Self-pay | Admitting: Internal Medicine

## 2012-11-27 ENCOUNTER — Other Ambulatory Visit: Payer: Self-pay | Admitting: Internal Medicine

## 2012-11-27 VITALS — BP 98/60 | HR 117 | Temp 98.8°F | Resp 12 | Ht 66.0 in | Wt 129.9 lb

## 2012-11-27 DIAGNOSIS — E1065 Type 1 diabetes mellitus with hyperglycemia: Secondary | ICD-10-CM

## 2012-11-27 DIAGNOSIS — Z23 Encounter for immunization: Secondary | ICD-10-CM

## 2012-11-27 NOTE — Progress Notes (Signed)
Patient ID: [name], female   DOB: [birth year], 32 y.o.   MRN: [id number]  HPI: [name] is a 32 y.o.-year-old woman returning for f/u for DM1, dx 2005, uncontrolled, with complications (hyperglycemia +/- DKA + coma, severe hypoglycemia, gastroparesis, peripheral neuropathy). She has been previously admitted multiple times, with glucose levels up to 1013 and CO2 down to <5, but not in the last 5 months. Last visit 1.5 mo ago.  Last hemoglobin A1c was: Lab Results  Component Value Date   HGBA1C 12.2* 08/18/2012  Previously: Component     Latest Ref Rng 06/29/2010 10/20/2011 04/28/2012  Hemoglobin A1C     4.0 - 6.0 % 7.4 (H) . . . 10.2 (H) 13.8 (H)  Mean Plasma Glucose     <117 mg/dL 409 (H) 811 (H) 914 (H)   She was on an insulin pump: Omnipod x 2 years - she liked it much better than the insulin injection, but in 14 mo, she went to the hospital 18x. She was advised to stop it >> she cam off the pump 7 mo ago. Had first DKA episode 2 years ago while in early labor.   Pt is on a regimen of: - Lantus 18 units (decreased from 22) units moved to am - Novolog 5 tid, but using 1 unit for 1 carb choice (15 g carbs) - SSi - target 200: +1 units, 300: +2 units, etc  Pt checks her sugars 5-6 a day and they are (does not bring meter or log): - am: 60-180 >> 80-140 >> 90-160 - before lunch: 200s >> 200-300s >> 100-180 - before dinner: too high to read >> 300-400s >> 210 usually - bedtime: >> 300-400s >> 280-290 She had a root canal before last visit >> had complications  >> ABx courses, reinterventions, 4 extra visits Snacks in the pm: raw veggies and fruit. She eats gluten-fee and mostly healthy foods.  Has lows, 90, but also can be ~100 around lunchtime; she has hypoglycemia awareness at now lower than 180, around 110!  She tells me that her 43 year old son saved her life twice, by performing CPR or injecting glucagon.  For exercise, she walks 60 minutes daily.  Pt does not have  chronic kidney disease, last BUN/creatinine was:  Lab Results  Component Value Date   BUN 22* 08/18/2012   CREATININE 1.0 08/18/2012  She is on Lisinopril.  Last set of lipids: Lab Results  Component Value Date   CHOL 256* 08/18/2012   HDL 31* 08/18/2012   LDLCALC UNABLE TO CALCULATE IF TRIGLYCERIDE OVER 400 mg/dL 08/30/2954   TRIG 213* 0/09/6576   CHOLHDL 5.4 04/28/2012   Pt's last eye exam was in 08/2011. No DR. Had an appt 08/2012 and cataracts a little worse >> was advised to return in 6 months. Has numbness and tingling in her feet/hands - on neurontin She has nausea - gastroparesis.   I reviewed pt's medications, allergies, PMH, social hx, family hx and no changes required, except as mentioned above.   ROS: Constitutional: no weight gain/loss, no fatigue, no subjective hyperthermia/hypothermia Eyes: no blurry vision, no xerophthalmia ENT: no sore throat, no nodules palpated in throat, no dysphagia/odynophagia, no hoarseness Cardiovascular: no CP/SOB/palpitations/leg swelling Respiratory: no cough/SOB Gastrointestinal: no N/V/D/C Musculoskeletal: no muscle/joint aches Skin: no rashes Neurological: no tremors/numbness/tingling/dizziness  PE: BP 98/60  Pulse 117  Temp(Src) 98.8 F (37.1 C) (Oral)  Resp 12  Ht 5\' 6"  (1.676 m)  Wt 129 lb 14.4 oz (58.922 kg)  BMI  20.98 kg/m2  SpO2 98% Wt Readings from Last 3 Encounters:  11/27/12 129 lb 14.4 oz (58.922 kg)  10/16/12 135 lb (61.236 kg)  09/14/12 135 lb (61.236 kg)   Constitutional: overweight, in NAD Eyes: PERRLA, EOMI, no exophthalmos ENT: moist mucous membranes, no thyromegaly, no cervical lymphadenopathy Cardiovascular: tachycardia, RR, No MRG Respiratory: CTA B Gastrointestinal: abdomen soft, NT, ND, BS+ Musculoskeletal: no deformities, strength intact in all 4 Skin: moist, warm, no rashes Neurological: no tremor with outstretched hands, DTR normal in all 4  ASSESSMENT: 1. DM1, uncontrolled, with complications -  h/o severe lows, not recent - highly fluctuating CBGs in the past, improved recently - repeated admissions in the past for DKA/hyperglycemia, not in the last 5 months - gastroparesis - on Zofran prn, now improved - peripheral neuropathy - on Neurontin  PLAN:  1.  Pt with long standing and very uncontrolled DM1, with complications and previous repeated admissions for DKA, now determined to gain control over her diabetes. Her sugars are much better and she feels much better, too. She did a great job over the last month and a half with her insulin compliance, and her sugars are much improved. They are still above goal and she still has a staircase effect of increasing sugars throughout the day. This is usually due to too little insulin. The fact that she drops her sugars from 290 at night to go low 100s in the morning is evidence that she has too much basal insulin in her system. - I advised her to do the following Decrease Lantus to 15 units in a.m. Increase mealtime Novolog by 1 unit for each meal (addition to the calculated dose based on carb choices) continue the correction scale the same: - 200-300: + 1 unit - 301-400: + 2 units - >400: + 3 units - I again explained that a physiologic insulin regimen will have approximately same doses of basal and bolus per day.  - given a new sugar log - continue to check 5-6 x a day >> BRING LOG AT NEXT VISIT - check hemoglobin A1c today - return in 2 months with sugar log.   I ordered a total Hb instead of a HbA1C >> I apologized to the pt and asked her to come back for a HbA1C. Will addednd results when available. Msg was sent through MyChart.

## 2012-11-27 NOTE — Patient Instructions (Signed)
Please decrease Lantus to 15 units. Please increase the mealtime insulin by 1 units at every meal. You may need to bolus with snacks if >1 carb serving/snack. Please return in 2 months with your sugar log.  KEEP UP THE GREAT WORK!

## 2012-12-03 ENCOUNTER — Encounter (HOSPITAL_COMMUNITY): Payer: Self-pay | Admitting: Emergency Medicine

## 2012-12-03 ENCOUNTER — Emergency Department (HOSPITAL_COMMUNITY)
Admission: EM | Admit: 2012-12-03 | Discharge: 2012-12-03 | Disposition: A | Discharge status: Home / Self Care | Payer: Medicaid Other | Attending: Emergency Medicine | Admitting: Emergency Medicine

## 2012-12-03 DIAGNOSIS — Z3202 Encounter for pregnancy test, result negative: Secondary | ICD-10-CM | POA: Insufficient documentation

## 2012-12-03 DIAGNOSIS — F411 Generalized anxiety disorder: Secondary | ICD-10-CM | POA: Insufficient documentation

## 2012-12-03 DIAGNOSIS — Z794 Long term (current) use of insulin: Secondary | ICD-10-CM | POA: Insufficient documentation

## 2012-12-03 DIAGNOSIS — Z8639 Personal history of other endocrine, nutritional and metabolic disease: Secondary | ICD-10-CM | POA: Insufficient documentation

## 2012-12-03 DIAGNOSIS — B349 Viral infection, unspecified: Secondary | ICD-10-CM

## 2012-12-03 DIAGNOSIS — E119 Type 2 diabetes mellitus without complications: Secondary | ICD-10-CM | POA: Insufficient documentation

## 2012-12-03 DIAGNOSIS — Z79899 Other long term (current) drug therapy: Secondary | ICD-10-CM | POA: Insufficient documentation

## 2012-12-03 DIAGNOSIS — Z91040 Latex allergy status: Secondary | ICD-10-CM | POA: Insufficient documentation

## 2012-12-03 DIAGNOSIS — B9789 Other viral agents as the cause of diseases classified elsewhere: Secondary | ICD-10-CM | POA: Insufficient documentation

## 2012-12-03 DIAGNOSIS — Z8719 Personal history of other diseases of the digestive system: Secondary | ICD-10-CM | POA: Insufficient documentation

## 2012-12-03 DIAGNOSIS — Z87891 Personal history of nicotine dependence: Secondary | ICD-10-CM | POA: Insufficient documentation

## 2012-12-03 DIAGNOSIS — Z862 Personal history of diseases of the blood and blood-forming organs and certain disorders involving the immune mechanism: Secondary | ICD-10-CM | POA: Insufficient documentation

## 2012-12-03 LAB — CBC WITH DIFFERENTIAL/PLATELET
Basophils Absolute: 0 10*3/uL (ref 0.0–0.1)
Basophils Relative: 0 % (ref 0–1)
Eosinophils Relative: 1 % (ref 0–5)
HCT: 38.8 % (ref 36.0–46.0)
MCHC: 35.1 g/dL (ref 30.0–36.0)
MCV: 89.6 fL (ref 78.0–100.0)
Monocytes Absolute: 0.8 10*3/uL (ref 0.1–1.0)
Neutro Abs: 7.2 10*3/uL (ref 1.7–7.7)
Platelets: 173 10*3/uL (ref 150–400)
RDW: 13.1 % (ref 11.5–15.5)

## 2012-12-03 LAB — URINALYSIS W MICROSCOPIC + REFLEX CULTURE
Glucose, UA: >1000 mg/dL — AB
Hgb urine dipstick: NEGATIVE
Leukocytes, UA: NEGATIVE
Specific Gravity, Urine: 1.034 — ABNORMAL HIGH (ref 1.005–1.030)
Urobilinogen, UA: 0.2 mg/dL (ref 0.0–1.0)
pH: 5 (ref 5.0–8.0)

## 2012-12-03 LAB — COMPREHENSIVE METABOLIC PANEL
ALT: 8 U/L (ref 0–35)
AST: 14 U/L (ref 0–37)
Albumin: 3.1 g/dL — ABNORMAL LOW (ref 3.5–5.2)
Alkaline Phosphatase: 90 U/L (ref 39–117)
Chloride: 96 mEq/L (ref 96–112)
Potassium: 3.7 mEq/L (ref 3.5–5.1)
Sodium: 130 mEq/L — ABNORMAL LOW (ref 135–145)
Total Bilirubin: 0.6 mg/dL (ref 0.3–1.2)
Total Protein: 6.3 g/dL (ref 6.0–8.3)

## 2012-12-03 LAB — GLUCOSE, CAPILLARY: Glucose-Capillary: 342 mg/dL — ABNORMAL HIGH (ref 70–99)

## 2012-12-03 MED ORDER — ONDANSETRON 4 MG PO TBDP: ORAL_TABLET | ORAL | Status: DC

## 2012-12-03 MED ORDER — ONDANSETRON HCL 4 MG/2ML IJ SOLN
4.0000 mg | Freq: Once | INTRAMUSCULAR | Status: AC
  Administered 2012-12-03: 4 mg via INTRAVENOUS
  Filled 2012-12-03: qty 2

## 2012-12-03 MED ORDER — SODIUM CHLORIDE 0.9 % IV BOLUS (SEPSIS)
1000.0000 mL | INTRAVENOUS | Status: AC
  Administered 2012-12-03: 1000 mL via INTRAVENOUS

## 2012-12-03 MED ORDER — KETOROLAC TROMETHAMINE 30 MG/ML IJ SOLN
30.0000 mg | Freq: Once | INTRAMUSCULAR | Status: AC
  Administered 2012-12-03: 30 mg via INTRAVENOUS
  Filled 2012-12-03: qty 1

## 2012-12-03 MED ORDER — ACETAMINOPHEN 325 MG PO TABS
650.0000 mg | ORAL_TABLET | Freq: Once | ORAL | Status: AC
  Administered 2012-12-03: 650 mg via ORAL
  Filled 2012-12-03: qty 2

## 2012-12-03 NOTE — ED Notes (Signed)
Spoke with lab. They report not receiving the appropriate blood tube for a CMP. This RN will redraw the blood.

## 2012-12-03 NOTE — ED Notes (Addendum)
Pt states this morning, started feeling nauseous, vomited multiple times throughout the day. Pt states she possibly picked up virus from daughter. Denies diarrhea. Denies unusual discharge, or burning with urination. States she is diabetic and is concerned she is in DKA. States stomach hurts a little bit, states she is having lower dull achy back pain.

## 2012-12-03 NOTE — ED Notes (Signed)
Pt. reports a 2 day hx. Of possible sore throat and n/v.  Pt. is diabetic and concerned for DKA.

## 2012-12-03 NOTE — ED Notes (Signed)
Attempted to gain IV access, unsuccessful. Pt reports she has had multiple PICC and central lines d/t poor veins. MD made aware, resident will attempt to gain IV access with ultrasound.

## 2012-12-03 NOTE — ED Provider Notes (Signed)
CSN: [id number]     Arrival date & time 12/03/12  1432 History   First MD Initiated Contact with Patient 12/03/12 1641     Chief Complaint  Patient presents with  . Sore Throat  . Diabetes   (Consider location/radiation/quality/duration/timing/severity/associated sxs/prior Treatment) Patient is a 32 y.o. female presenting with pharyngitis and diabetes problem. The history is provided by the patient.  Sore Throat This is a new problem. The current episode started 2 days ago. The problem occurs constantly. The problem has not changed since onset.Pertinent negatives include no chest pain, no abdominal pain, no headaches and no shortness of breath. Nothing aggravates the symptoms. Nothing relieves the symptoms. She has tried acetaminophen for the symptoms. The treatment provided mild relief.  Diabetes Pertinent negatives include no chest pain, no abdominal pain, no headaches and no shortness of breath.    Past Medical History  Diagnosis Date  . Diabetes mellitus   . Thyroid disease   . Back pain   . Panic attack   . PONV (postoperative nausea and vomiting)   . Anxiety   . GERD (gastroesophageal reflux disease)   . DM gastroparesis    Past Surgical History  Procedure Laterality Date  . Laparoscopy      age 48  . Back surgery      age 46  . Tooth extraction    . Root canal  10/10/12   Family History  Problem Relation Age of Onset  . Other Mother   . Other Father    History  Substance Use Topics  . Smoking status: Former Smoker    Quit date: 04/19/2000  . Smokeless tobacco: Former Neurosurgeon  . Alcohol Use: Yes     Comment: social, none now 03/01/2012   OB History   Grav Para Term Preterm Abortions TAB SAB Ect Mult Living                 Review of Systems  Constitutional: Negative for fever and fatigue.  HENT: Negative for congestion and drooling.   Eyes: Negative for pain.  Respiratory: Negative for cough and shortness of breath.   Cardiovascular: Negative for chest pain.   Gastrointestinal: Negative for nausea, vomiting, abdominal pain and diarrhea.  Genitourinary: Negative for dysuria and hematuria.  Musculoskeletal: Negative for back pain, gait problem and neck pain.  Skin: Negative for color change.  Neurological: Negative for dizziness and headaches.  Hematological: Negative for adenopathy.  Psychiatric/Behavioral: Negative for behavioral problems.  All other systems reviewed and are negative.    Allergies  Scallops; Morphine and related; Tylenol; and Latex  Home Medications   Current Outpatient Rx  Name  Route  Sig  Dispense  Refill  . cloNIDine (CATAPRES) 0.1 MG tablet   Oral   Take 0.1 mg by mouth 2 (two) times daily.         Marland Kitchen gabapentin (NEURONTIN) 100 MG capsule   Oral   Take 100 mg by mouth 3 (three) times daily.         Marland Kitchen HYDROcodone-acetaminophen (NORCO) 7.5-325 MG per tablet   Oral   Take 0.25-1 tablets by mouth every 6 (six) hours as needed for pain.         Marland Kitchen insulin aspart (NOVOLOG) 100 UNIT/ML injection   Subcutaneous   Inject 4 Units into the skin 3 (three) times daily with meals.         . insulin glargine (LANTUS) 100 UNIT/ML injection   Subcutaneous   Inject 16 Units into the  skin every morning.         Marland Kitchen lisinopril (PRINIVIL,ZESTRIL) 2.5 MG tablet   Oral   Take 2.5 mg by mouth daily.         Marland Kitchen lovastatin (MEVACOR) 20 MG tablet   Oral   Take 20 mg by mouth at bedtime.         . Multiple Vitamin (MULTIVITAMIN WITH MINERALS) TABS   Oral   Take 1 tablet by mouth daily. Diabetic support vitamin         . niacin 500 MG tablet   Oral   Take 1 tablet (500 mg total) by mouth at bedtime.   30 tablet   1   . glucagon (GLUCAGON EMERGENCY) 1 MG injection   Intramuscular   Inject 1 mg into the muscle once as needed.   1 each   prn   . Insulin Pen Needle (PEN NEEDLES 3/16") 31G X 5 MM MISC   Does not apply   1 each by Does not apply route 4 (four) times daily.   200 each   12    BP 108/56   Pulse 110  Temp(Src) 99 F (37.2 C) (Oral)  Resp 18  Wt 125 lb (56.7 kg)  BMI 20.19 kg/m2  SpO2 99%  LMP 10/03/2012 Physical Exam  Nursing note and vitals reviewed. Constitutional: She is oriented to person, place, and time. She appears well-developed and well-nourished.  HENT:  Head: Normocephalic.  Mouth/Throat: No oropharyngeal exudate.  Mild erythema of posterior oropharynx which otherwise appears normal. Mild prominence of anterior cervical adenopathy bilaterally. Normal range of motion of neck.  Eyes: Conjunctivae and EOM are normal. Pupils are equal, round, and reactive to light.  Neck: Normal range of motion. Neck supple.  Cardiovascular: Regular rhythm, normal heart sounds and intact distal pulses.  Exam reveals no gallop and no friction rub.   No murmur heard. HR 110  Pulmonary/Chest: Effort normal and breath sounds normal. No respiratory distress. She has no wheezes.  Abdominal: Soft. Bowel sounds are normal. There is no tenderness. There is no rebound and no guarding.  Musculoskeletal: Normal range of motion. She exhibits no edema and no tenderness.  Neurological: She is alert and oriented to person, place, and time.  Skin: Skin is warm and dry.  Psychiatric: She has a normal mood and affect. Her behavior is normal.    ED Course  Procedures (including critical care time) Labs Review Labs Reviewed  GLUCOSE, CAPILLARY - Abnormal; Notable for the following:    Glucose-Capillary 342 (*)    All other components within normal limits  URINALYSIS W MICROSCOPIC + REFLEX CULTURE - Abnormal; Notable for the following:    Specific Gravity, Urine 1.034 (*)    Glucose, UA >1000 (*)    Ketones, ur >80 (*)    All other components within normal limits  COMPREHENSIVE METABOLIC PANEL - Abnormal; Notable for the following:    Sodium 130 (*)    CO2 18 (*)    Glucose, Bld 226 (*)    Calcium 7.5 (*)    Albumin 3.1 (*)    All other components within normal limits  GLUCOSE,  CAPILLARY - Abnormal; Notable for the following:    Glucose-Capillary 224 (*)    All other components within normal limits  GLUCOSE, CAPILLARY - Abnormal; Notable for the following:    Glucose-Capillary 205 (*)    All other components within normal limits  RAPID STREP SCREEN  CULTURE, GROUP A STREP  CBC WITH DIFFERENTIAL  POCT PREGNANCY, URINE   Imaging Review No results found.  EKG Interpretation   None      Procedure note: Ultrasound Guided Peripheral IV Ultrasound guided 20 g peripheral 1.88 inch angiocath IV placement performed by me. Indications: Nursing unable to place IV. Details: The  right antecubital fossa and upper arm was evaluated with a multifrequency linear probe. Several patent brachial veins are noted. 2 attempts were made to cannulate a right antecubital vein under realtime US guidance with successful cannulation of the vein and catheter placement. There is return of non-pulsatile dark red blood. The patient tolerated the procedure well without complications.    MDM   1. Viral syndrome    4:49 PM 32 y.o. female who presents with sore throat for 2 days and vomiting that began today. The patient has a history of diabetes and DKA in the past. She is afebrile and slightly tachycardic here. She denies any abdominal pain, diarrhea, fever. Will get screening labwork, urinalysis, IV fluid, and nausea control. Will give Toradol for body aches.  8:47 PM: Anion gap is 16 which is upper limit of normal, but BS has dec appropriately, pt tolerating po. Doubt DKA. Will rec close f/u and return for any worsening.  I have discussed the diagnosis/risks/treatment options with the patient and believe the pt to be eligible for discharge home to follow-up with pcp in 1-2 days. We also discussed returning to the ED immediately if new or worsening sx occur. We discussed the sx which are most concerning (e.g., worsening vomiting, fever, elevated BS) that necessitate immediate return. Any new  prescriptions provided to the patient are listed below.  New Prescriptions   ONDANSETRON (ZOFRAN ODT) 4 MG DISINTEGRATING TABLET    4mg  ODT q4 hours prn nausea/vomit     Junius Argyle, MD 12/03/12 2049

## 2012-12-03 NOTE — ED Notes (Signed)
Harrison, MD at bedside. 

## 2012-12-05 ENCOUNTER — Ambulatory Visit: Payer: Medicaid Other | Admitting: *Deleted

## 2012-12-05 LAB — CULTURE, GROUP A STREP

## 2012-12-06 NOTE — ED Notes (Signed)
Post ED Visit - Positive Culture Follow-up: Successful Patient Follow-Up  Culture assessed and recommendations reviewed by: [x]  Wes Dulaney, Pharm.D., BCPS []  Celedonio Miyamoto, Pharm.D., BCPS []  Georgina Pillion, [address].D., BCPS []  [place], [address].D., BCPS, AAHIVP []  Estella Husk, Pharm.D., BCPS, AAHIVP  Positive strep culture  []  Patient discharged without antimicrobial prescription and treatment is now indicated [x]  Organism is resistant to prescribed ED discharge antimicrobial []  Patient with positive blood cultures  Changes discussed with ED provider: [place] [facility] New antibiotic prescription Amoxicillin 500 mg po BID x 10 days     Larena Sox 12/06/2012, 3:05 PM

## 2012-12-06 NOTE — Progress Notes (Signed)
ED Antimicrobial Stewardship Positive Culture Follow Up   [name] is an 32 y.o. female who presented to [facility] on 12/03/2012 with a chief complaint of  Chief Complaint  Patient presents with  . Sore Throat  . Diabetes    Recent Results (from the past 720 hour(s))  RAPID STREP SCREEN     Status: None   Collection Time    12/03/12  2:58 PM      Result Value Range Status   Streptococcus, Group A Screen (Direct) NEGATIVE  NEGATIVE Final   Comment: (NOTE)     A Rapid Antigen test may result negative if the antigen level in the     sample is below the detection level of this test. The FDA has not     cleared this test as a stand-alone test therefore the rapid antigen     negative result has reflexed to a Group A Strep culture.  CULTURE, GROUP A STREP     Status: None   Collection Time    12/03/12  2:58 PM      Result Value Range Status   Specimen Description THROAT   Final   Special Requests NONE   Final   Culture     Final   Value: STREPTOCOCCUS,BETA HEMOLYIC NOT GROUP A     Performed at [facility]   Report Status 12/05/2012 FINAL   Final    Recommendation: If symptoms (sore throat) improving, no treatment indicated. If symptoms persist/worsening, start Amoxicillin 500mg  PO BID x 10 days.   ED Provider: Johnnette Gourd, PA-C   Cleon Dew 12/06/2012, 5:14 PM Infectious Diseases Pharmacist Phone# [phone]

## 2012-12-07 ENCOUNTER — Telehealth (HOSPITAL_COMMUNITY): Payer: Self-pay | Admitting: *Deleted

## 2012-12-07 NOTE — ED Notes (Signed)
Patient f/u with PCP treated with Septra.

## 2013-01-15 ENCOUNTER — Encounter (HOSPITAL_COMMUNITY): Payer: Self-pay | Admitting: Emergency Medicine

## 2013-01-15 ENCOUNTER — Emergency Department (HOSPITAL_COMMUNITY)
Admission: EM | Admit: 2013-01-15 | Discharge: 2013-01-16 | Disposition: A | Discharge status: Home / Self Care | Payer: Medicaid Other | Attending: Emergency Medicine | Admitting: Emergency Medicine

## 2013-01-15 DIAGNOSIS — R079 Chest pain, unspecified: Secondary | ICD-10-CM | POA: Insufficient documentation

## 2013-01-15 DIAGNOSIS — Z794 Long term (current) use of insulin: Secondary | ICD-10-CM | POA: Insufficient documentation

## 2013-01-15 DIAGNOSIS — Z87891 Personal history of nicotine dependence: Secondary | ICD-10-CM | POA: Insufficient documentation

## 2013-01-15 DIAGNOSIS — E1149 Type 2 diabetes mellitus with other diabetic neurological complication: Secondary | ICD-10-CM | POA: Insufficient documentation

## 2013-01-15 DIAGNOSIS — F410 Panic disorder [episodic paroxysmal anxiety] without agoraphobia: Secondary | ICD-10-CM | POA: Insufficient documentation

## 2013-01-15 DIAGNOSIS — Z79899 Other long term (current) drug therapy: Secondary | ICD-10-CM | POA: Insufficient documentation

## 2013-01-15 DIAGNOSIS — R109 Unspecified abdominal pain: Secondary | ICD-10-CM | POA: Insufficient documentation

## 2013-01-15 DIAGNOSIS — Z91040 Latex allergy status: Secondary | ICD-10-CM | POA: Insufficient documentation

## 2013-01-15 DIAGNOSIS — Z8719 Personal history of other diseases of the digestive system: Secondary | ICD-10-CM | POA: Insufficient documentation

## 2013-01-15 DIAGNOSIS — K3184 Gastroparesis: Secondary | ICD-10-CM | POA: Insufficient documentation

## 2013-01-15 DIAGNOSIS — E1110 Type 2 diabetes mellitus with ketoacidosis without coma: Secondary | ICD-10-CM | POA: Insufficient documentation

## 2013-01-15 NOTE — ED Notes (Signed)
Pt complains of a stabbing chest pain for 5 days, pt is tearful in triage

## 2013-01-16 ENCOUNTER — Emergency Department (HOSPITAL_COMMUNITY): Payer: Medicaid Other

## 2013-01-16 LAB — GLUCOSE, CAPILLARY
Glucose-Capillary: 142 mg/dL — ABNORMAL HIGH (ref 70–99)
Glucose-Capillary: 213 mg/dL — ABNORMAL HIGH (ref 70–99)
Glucose-Capillary: 271 mg/dL — ABNORMAL HIGH (ref 70–99)
Glucose-Capillary: >600 mg/dL (ref 70–99)

## 2013-01-16 LAB — POCT I-STAT, CHEM 8
BUN: 4 mg/dL — ABNORMAL LOW (ref 6–23)
Calcium, Ion: 1.15 mmol/L (ref 1.12–1.23)
Chloride: 100 mEq/L (ref 96–112)
Creatinine, Ser: 0.9 mg/dL (ref 0.50–1.10)
Glucose, Bld: 177 mg/dL — ABNORMAL HIGH (ref 70–99)
HCT: 41 % (ref 36.0–46.0)
Potassium: 3.4 mEq/L — ABNORMAL LOW (ref 3.5–5.1)
TCO2: 26 mmol/L (ref 0–100)

## 2013-01-16 LAB — BLOOD GAS, VENOUS
Acid-base deficit: 2.5 mmol/L — ABNORMAL HIGH (ref 0.0–2.0)
Bicarbonate: 21.8 mEq/L (ref 20.0–24.0)
O2 Saturation: 98.8 %
Patient temperature: 98.6
TCO2: 19.3 mmol/L (ref 0–100)
pCO2, Ven: 38.1 mmHg — ABNORMAL LOW (ref 45.0–50.0)
pCO2, Ven: 34.7 mmHg — ABNORMAL LOW (ref 45.0–50.0)
pH, Ven: 7.433 — ABNORMAL HIGH (ref 7.250–7.300)

## 2013-01-16 LAB — COMPREHENSIVE METABOLIC PANEL
ALT: 16 U/L (ref 0–35)
AST: 25 U/L (ref 0–37)
Albumin: 4.2 g/dL (ref 3.5–5.2)
Alkaline Phosphatase: 90 U/L (ref 39–117)
CO2: 21 mEq/L (ref 19–32)
Calcium: 9.7 mg/dL (ref 8.4–10.5)
Chloride: 86 mEq/L — ABNORMAL LOW (ref 96–112)
Creatinine, Ser: 0.66 mg/dL (ref 0.50–1.10)
GFR calc Af Amer: >90 mL/min (ref >90)
GFR calc non Af Amer: >90 mL/min (ref >90)
Glucose, Bld: 664 mg/dL (ref 70–99)
Potassium: 4.1 mEq/L (ref 3.5–5.1)
Sodium: 128 mEq/L — ABNORMAL LOW (ref 135–145)
Total Bilirubin: 0.2 mg/dL — ABNORMAL LOW (ref 0.3–1.2)

## 2013-01-16 LAB — URINALYSIS, ROUTINE W REFLEX MICROSCOPIC
Bilirubin Urine: NEGATIVE
Hgb urine dipstick: NEGATIVE
Ketones, ur: NEGATIVE mg/dL
Leukocytes, UA: NEGATIVE
Nitrite: NEGATIVE
Protein, ur: NEGATIVE mg/dL
Urobilinogen, UA: 0.2 mg/dL (ref 0.0–1.0)

## 2013-01-16 LAB — CBC WITH DIFFERENTIAL/PLATELET
Basophils Absolute: 0 10*3/uL (ref 0.0–0.1)
Eosinophils Relative: 2 % (ref 0–5)
HCT: 44.2 % (ref 36.0–46.0)
Hemoglobin: 15.7 g/dL — ABNORMAL HIGH (ref 12.0–15.0)
Lymphocytes Relative: 41 % (ref 12–46)
Lymphs Abs: 1.7 10*3/uL (ref 0.7–4.0)
MCV: 92.5 fL (ref 78.0–100.0)
Monocytes Absolute: 0.3 10*3/uL (ref 0.1–1.0)
Neutro Abs: 2.1 10*3/uL (ref 1.7–7.7)
Platelets: 209 10*3/uL (ref 150–400)
RBC: 4.78 MIL/uL (ref 3.87–5.11)
RDW: 13.7 % (ref 11.5–15.5)
WBC: 4.2 10*3/uL (ref 4.0–10.5)

## 2013-01-16 LAB — BASIC METABOLIC PANEL
CO2: 22 mEq/L (ref 19–32)
Calcium: 9.2 mg/dL (ref 8.4–10.5)
GFR calc non Af Amer: >90 mL/min (ref >90)
Sodium: 138 mEq/L (ref 135–145)

## 2013-01-16 LAB — LIPASE, BLOOD: Lipase: 19 U/L (ref 11–59)

## 2013-01-16 MED ORDER — HYDROCODONE-ACETAMINOPHEN 5-325 MG PO TABS
1.0000 | ORAL_TABLET | Freq: Once | ORAL | Status: AC
  Administered 2013-01-16: 1 via ORAL
  Filled 2013-01-16: qty 1

## 2013-01-16 MED ORDER — INSULIN GLARGINE 100 UNIT/ML ~~LOC~~ SOLN
22.0000 [IU] | Freq: Once | SUBCUTANEOUS | Status: DC
Start: 2013-01-16 — End: 2013-01-16
  Filled 2013-01-16: qty 0.22

## 2013-01-16 MED ORDER — SODIUM CHLORIDE 0.9 % IV BOLUS (SEPSIS)
1000.0000 mL | Freq: Once | INTRAVENOUS | Status: AC
  Administered 2013-01-16: 1000 mL via INTRAVENOUS

## 2013-01-16 MED ORDER — SODIUM CHLORIDE 0.9 % IV SOLN
1000.0000 mL | Freq: Once | INTRAVENOUS | Status: AC
  Administered 2013-01-16: 1000 mL via INTRAVENOUS

## 2013-01-16 MED ORDER — ONDANSETRON HCL 4 MG/2ML IJ SOLN
4.0000 mg | Freq: Once | INTRAMUSCULAR | Status: AC
  Administered 2013-01-16: 4 mg via INTRAVENOUS
  Filled 2013-01-16: qty 2

## 2013-01-16 MED ORDER — INSULIN GLARGINE 100 UNIT/ML ~~LOC~~ SOLN
18.0000 [IU] | Freq: Once | SUBCUTANEOUS | Status: AC
  Administered 2013-01-16: 18 [IU] via SUBCUTANEOUS
  Filled 2013-01-16: qty 0.18

## 2013-01-16 MED ORDER — SODIUM CHLORIDE 0.9 % IV SOLN
INTRAVENOUS | Status: DC
  Administered 2013-01-16: 4 [IU]/h via INTRAVENOUS
  Filled 2013-01-16: qty 1

## 2013-01-16 MED ORDER — DEXTROSE-NACL 5-0.45 % IV SOLN: INTRAVENOUS | Status: DC

## 2013-01-16 MED ORDER — KETOROLAC TROMETHAMINE 30 MG/ML IJ SOLN
30.0000 mg | Freq: Once | INTRAMUSCULAR | Status: AC
  Administered 2013-01-16: 30 mg via INTRAVENOUS
  Filled 2013-01-16: qty 1

## 2013-01-16 MED ORDER — ONDANSETRON 4 MG PO TBDP
4.0000 mg | ORAL_TABLET | Freq: Once | ORAL | Status: AC
  Administered 2013-01-16: 4 mg via ORAL
  Filled 2013-01-16: qty 1

## 2013-01-16 MED ORDER — HYDROMORPHONE HCL PF 1 MG/ML IJ SOLN
1.0000 mg | Freq: Once | INTRAMUSCULAR | Status: AC
  Administered 2013-01-16: 1 mg via INTRAVENOUS
  Filled 2013-01-16: qty 1

## 2013-01-16 MED ORDER — SODIUM CHLORIDE 0.9 % IV SOLN
INTRAVENOUS | Status: DC
  Filled 2013-01-16: qty 1

## 2013-01-16 NOTE — ED Notes (Signed)
Writer gave a [place] sandwich and a diet coke

## 2013-01-16 NOTE — ED Provider Notes (Signed)
Pt is a brittle diabetic.  Initial anion gap of 22.  Plan to repeat Istat at 7am, d/c once pt tolerates PO and stable for discharge.    7:24 AM Pt reports pleuritic chest pain, without productive cough or hemoptysis.  No significant risk factors for PE however she is tachycardic.  CXR without acute abnormality.  Will check d-dimer to r/o PE.  Pain medication given.  Will also repeat istat8 to check anion gap.  Pt currently able to tolerates PO, nausea improved.  7:53 AM Repeat anion gap is 15.0.  Pt however continues to endorse pleuritic cp.  Pain medication given  8:58 AM D-dimer negative.  Anion gap normalized.  Pt felt better, stable for discharge.  She agrees to have close follow up with her pcp for further care.  Pt also will try not to miss taking her diabetic medication.    BP 107/71  Pulse 82  Temp(Src) 97.6 F (36.4 C) (Oral)  Resp 18  SpO2 98%  LMP 12/03/2012  I have reviewed nursing notes and vital signs. I personally reviewed the imaging tests through PACS system  I reviewed available ER/hospitalization records thought the EMR  Results for orders placed during the hospital encounter of 01/15/13  GLUCOSE, CAPILLARY      Result Value Range   Glucose-Capillary >600 (*) 70 - 99 mg/dL  CBC WITH DIFFERENTIAL      Result Value Range   WBC 4.2  4.0 - 10.5 K/uL   RBC 4.78  3.87 - 5.11 MIL/uL   Hemoglobin 15.7 (*) 12.0 - 15.0 g/dL   HCT 16.1  09.6 - 04.5 %   MCV 92.5  78.0 - 100.0 fL   MCH 32.8  26.0 - 34.0 pg   MCHC 35.5  30.0 - 36.0 g/dL   RDW 40.9  81.1 - 91.4 %   Platelets 209  150 - 400 K/uL   Neutrophils Relative % 49  43 - 77 %   Neutro Abs 2.1  1.7 - 7.7 K/uL   Lymphocytes Relative 41  12 - 46 %   Lymphs Abs 1.7  0.7 - 4.0 K/uL   Monocytes Relative 8  3 - 12 %   Monocytes Absolute 0.3  0.1 - 1.0 K/uL   Eosinophils Relative 2  0 - 5 %   Eosinophils Absolute 0.1  0.0 - 0.7 K/uL   Basophils Relative 0  0 - 1 %   Basophils Absolute 0.0  0.0 - 0.1 K/uL   COMPREHENSIVE METABOLIC PANEL      Result Value Range   Sodium 128 (*) 135 - 145 mEq/L   Potassium 4.1  3.5 - 5.1 mEq/L   Chloride 86 (*) 96 - 112 mEq/L   CO2 21  19 - 32 mEq/L   Glucose, Bld 664 (*) 70 - 99 mg/dL   BUN 7  6 - 23 mg/dL   Creatinine, Ser 7.82  0.50 - 1.10 mg/dL   Calcium 9.7  8.4 - 95.6 mg/dL   Total Protein 7.9  6.0 - 8.3 g/dL   Albumin 4.2  3.5 - 5.2 g/dL   AST 25  0 - 37 U/L   ALT 16  0 - 35 U/L   Alkaline Phosphatase 90  39 - 117 U/L   Total Bilirubin 0.2 (*) 0.3 - 1.2 mg/dL   GFR calc non Af Amer >90  >90 mL/min   GFR calc Af Amer >90  >90 mL/min  URINALYSIS, ROUTINE W REFLEX MICROSCOPIC  Result Value Range   Color, Urine YELLOW  YELLOW   APPearance CLEAR  CLEAR   Specific Gravity, Urine 1.033 (*) 1.005 - 1.030   pH 5.5  5.0 - 8.0   Glucose, UA >1000 (*) NEGATIVE mg/dL   Hgb urine dipstick NEGATIVE  NEGATIVE   Bilirubin Urine NEGATIVE  NEGATIVE   Ketones, ur NEGATIVE  NEGATIVE mg/dL   Protein, ur NEGATIVE  NEGATIVE mg/dL   Urobilinogen, UA 0.2  0.0 - 1.0 mg/dL   Nitrite NEGATIVE  NEGATIVE   Leukocytes, UA NEGATIVE  NEGATIVE  BLOOD GAS, VENOUS      Result Value Range   FIO2 0.21     pH, Ven 7.375 (*) 7.250 - 7.300   pCO2, Ven 38.1 (*) 45.0 - 50.0 mmHg   pO2, Ven 150.0 (*) 30.0 - 45.0 mmHg   Bicarbonate 21.8  20.0 - 24.0 mEq/L   TCO2 19.3  0 - 100 mmol/L   Acid-base deficit 2.5 (*) 0.0 - 2.0 mmol/L   O2 Saturation 98.8     Patient temperature 98.6     Collection site VEIN     Drawn by  Bud Face, RN      Sample type VEIN    LIPASE, BLOOD      Result Value Range   Lipase 19  11 - 59 U/L  URINE MICROSCOPIC-ADD ON      Result Value Range   Squamous Epithelial / LPF FEW (*) RARE  GLUCOSE, CAPILLARY      Result Value Range   Glucose-Capillary 464 (*) 70 - 99 mg/dL  GLUCOSE, CAPILLARY      Result Value Range   Glucose-Capillary 271 (*) 70 - 99 mg/dL   Comment 1 Documented in Chart     Comment 2 Notify RN    BASIC METABOLIC PANEL       Result Value Range   Sodium 138  135 - 145 mEq/L   Potassium 3.5  3.5 - 5.1 mEq/L   Chloride 99  96 - 112 mEq/L   CO2 22  19 - 32 mEq/L   Glucose, Bld 292 (*) 70 - 99 mg/dL   BUN 6  6 - 23 mg/dL   Creatinine, Ser 1.61  0.50 - 1.10 mg/dL   Calcium 9.2  8.4 - 09.6 mg/dL   GFR calc non Af Amer >90  >90 mL/min   GFR calc Af Amer >90  >90 mL/min  BLOOD GAS, VENOUS      Result Value Range   pH, Ven 7.433 (*) 7.250 - 7.300   pCO2, Ven 34.7 (*) 45.0 - 50.0 mmHg   pO2, Ven 128.0 (*) 30.0 - 45.0 mmHg   Bicarbonate 22.7  20.0 - 24.0 mEq/L   TCO2 20.2  0 - 100 mmol/L   Acid-base deficit 0.5  0.0 - 2.0 mmol/L   O2 Saturation 98.6     Patient temperature 98.6     Collection site VEIN     Drawn by  Reola Mosher, NT     Sample type VEIN    GLUCOSE, CAPILLARY      Result Value Range   Glucose-Capillary 213 (*) 70 - 99 mg/dL   Comment 1 Documented in Chart     Comment 2 Notify RN    GLUCOSE, CAPILLARY      Result Value Range   Glucose-Capillary 142 (*) 70 - 99 mg/dL   Comment 1 Documented in Chart     Comment 2 Notify RN    D-DIMER, QUANTITATIVE  Result Value Range   D-Dimer, Quant <0.27  0.00 - 0.48 ug/mL-FEU  POCT I-STAT, CHEM 8      Result Value Range   Sodium 141  135 - 145 mEq/L   Potassium 3.4 (*) 3.5 - 5.1 mEq/L   Chloride 100  96 - 112 mEq/L   BUN 4 (*) 6 - 23 mg/dL   Creatinine, Ser 1.61  0.50 - 1.10 mg/dL   Glucose, Bld 096 (*) 70 - 99 mg/dL   Calcium, Ion 0.45  4.09 - 1.23 mmol/L   TCO2 26  0 - 100 mmol/L   Hemoglobin 13.9  12.0 - 15.0 g/dL   HCT 81.1  91.4 - 78.2 %   Dg Chest 2 View  01/16/2013   CLINICAL DATA:  Chest pain and shortness of breath.  EXAM: CHEST  2 VIEW  COMPARISON:  Chest radiograph May 12, 2012  FINDINGS: Cardiomediastinal silhouette is unremarkable. The lungs are clear without pleural effusions or focal consolidations. Pulmonary vasculature is unremarkable. Trachea projects midline and there is no pneumothorax. Soft tissue planes and included  osseous structures are nonsuspicious. Bilateral nipple piercings. Multiple EKG lines overlie the patient and may obscure subtle underlying pathology. Surgical clips at the thoracic inlet.  IMPRESSION: No active cardiopulmonary disease. Stable appearance of the chest from May 12, 2012.   Electronically Signed   By: Awilda Metro   On: 01/16/2013 04:18    Medications  HYDROcodone-acetaminophen (NORCO/VICODIN) 5-325 MG per tablet 1 tablet (not administered)  sodium chloride 0.9 % bolus 1,000 mL (0 mLs Intravenous Stopped 01/16/13 0328)  HYDROmorphone (DILAUDID) injection 1 mg (1 mg Intravenous Given 01/16/13 0119)  ondansetron (ZOFRAN) injection 4 mg (4 mg Intravenous Given 01/16/13 0119)  0.9 %  sodium chloride infusion (1,000 mLs Intravenous New Bag/Given 01/16/13 0122)  ketorolac (TORADOL) 30 MG/ML injection 30 mg (30 mg Intravenous Given 01/16/13 0301)  HYDROcodone-acetaminophen (NORCO/VICODIN) 5-325 MG per tablet 1 tablet (1 tablet Oral Given 01/16/13 0620)  ondansetron (ZOFRAN-ODT) disintegrating tablet 4 mg (4 mg Oral Given 01/16/13 0621)  insulin glargine (LANTUS) injection 18 Units (18 Units Subcutaneous Given 01/16/13 0651)  HYDROmorphone (DILAUDID) injection 1 mg (1 mg Intravenous Given 01/16/13 0730)     Fayrene Helper, PA-C 01/16/13 [phone]

## 2013-01-16 NOTE — ED Provider Notes (Signed)
CSN: [id number]     Arrival date & time 01/15/13  2257 History   First MD Initiated Contact with Patient 01/16/13 0052     Chief Complaint  Patient presents with  . Chest Pain   (Consider location/radiation/quality/duration/timing/severity/associated sxs/prior Treatment) HPI Comments: Patient reports, that she's had sharp, stabbing left chest pain for the past 5 days not helped by intermittent use of over-the-counter Percocet 1-2 tablets in a 24-hour period.  She also reports, that she's had some fluctuations in her blood sugar.  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  She has not called her primary care physician as of yet.  She, thought she'd wait until tomorrow States until yesterday.  Her blood sugars have been normal, but yesterday.  They were very fluctuant in the 400s down to 100 on arrival to the emergency department at greater than 600  Patient is a 32 y.o. female presenting with chest pain. The history is provided by the patient.  Chest Pain Pain location:  L chest Pain quality: stabbing   Pain radiates to:  Does not radiate Pain radiates to the back: no   Pain severity:  Severe Onset quality:  Sudden Duration:  5 days Timing:  Constant Progression:  Worsening Chronicity:  Recurrent Context: breathing and movement   Relieved by:  Nothing Worsened by:  Certain positions, deep breathing, coughing, exertion and movement Ineffective treatments: Percocet. Associated symptoms: abdominal pain and anxiety   Associated symptoms: no back pain, no claudication, no diaphoresis, no dizziness, no fever, no lower extremity edema, no nausea, no shortness of breath, not vomiting and no weakness   Risk factors: diabetes mellitus     Past Medical History  Diagnosis Date  . Diabetes mellitus   . Thyroid disease   . Back pain   . Panic attack   . PONV (postoperative nausea and vomiting)   . Anxiety   . GERD (gastroesophageal reflux disease)     . DM gastroparesis    Past Surgical History  Procedure Laterality Date  . Laparoscopy      age 41  . Back surgery      age 38  . Tooth extraction    . Root canal  10/10/12   Family History  Problem Relation Age of Onset  . Other Mother   . Other Father    History  Substance Use Topics  . Smoking status: Former Smoker    Quit date: 04/19/2000  . Smokeless tobacco: Former Neurosurgeon  . Alcohol Use: Yes     Comment: social, none now 03/01/2012   OB History   Grav Para Term Preterm Abortions TAB SAB Ect Mult Living                 Review of Systems  Constitutional: Negative for fever and diaphoresis.  Respiratory: Negative for shortness of breath.   Cardiovascular: Positive for chest pain. Negative for claudication.  Gastrointestinal: Positive for abdominal pain. Negative for nausea, vomiting and diarrhea.  Musculoskeletal: Negative for back pain.  Neurological: Negative for dizziness and weakness.  All other systems reviewed and are negative.    Allergies  Scallops; Morphine and related; Tylenol; and Latex  Home Medications   Current Outpatient Rx  Name  Route  Sig  Dispense  Refill  . cloNIDine (CATAPRES) 0.1 MG tablet   Oral   Take 0.1 mg by mouth 2 (two) times daily.         Marland Kitchen gabapentin (NEURONTIN) 100  MG capsule   Oral   Take 200 mg by mouth 3 (three) times daily.          Marland Kitchen HYDROcodone-acetaminophen (NORCO/VICODIN) 5-325 MG per tablet   Oral   Take 1 tablet by mouth every 6 (six) hours as needed for moderate pain.         Marland Kitchen insulin aspart (NOVOLOG) 100 UNIT/ML injection   Subcutaneous   Inject 1-3 Units into the skin 3 (three) times daily with meals. Sliding scale         . insulin glargine (LANTUS) 100 UNIT/ML injection   Subcutaneous   Inject 22 Units into the skin every morning.          Marland Kitchen lisinopril (PRINIVIL,ZESTRIL) 2.5 MG tablet   Oral   Take 2.5 mg by mouth every evening.          . lovastatin (MEVACOR) 20 MG tablet   Oral    Take 20 mg by mouth at bedtime.         . Multiple Vitamin (MULTIVITAMIN WITH MINERALS) TABS   Oral   Take 1 tablet by mouth every morning. Diabetic support vitamin         . niacin 500 MG tablet   Oral   Take 1 tablet (500 mg total) by mouth at bedtime.   30 tablet   1   . ondansetron (ZOFRAN-ODT) 8 MG disintegrating tablet   Oral   Take 8 mg by mouth every 8 (eight) hours as needed for nausea or vomiting.         Marland Kitchen glucagon (GLUCAGON EMERGENCY) 1 MG injection   Intramuscular   Inject 1 mg into the muscle once as needed (severe hypoglycemia).          BP 107/71  Pulse 82  Temp(Src) 97.6 F (36.4 C) (Oral)  Resp 18  SpO2 98%  LMP 12/03/2012 Physical Exam  Nursing note and vitals reviewed. Constitutional: She appears well-developed and well-nourished.  HENT:  Head: Normocephalic.  Eyes: Pupils are equal, round, and reactive to light.  Neck: Normal range of motion.  Cardiovascular: Normal rate and regular rhythm.   Pulmonary/Chest: Effort normal and breath sounds normal. She exhibits tenderness.    Abdominal: She exhibits no distension.  Musculoskeletal: Normal range of motion.  Neurological: She is alert.  Skin: There is pallor.    ED Course  Procedures (including critical care time) Labs Review Labs Reviewed  GLUCOSE, CAPILLARY - Abnormal; Notable for the following:    Glucose-Capillary >600 (*)    All other components within normal limits  CBC WITH DIFFERENTIAL - Abnormal; Notable for the following:    Hemoglobin 15.7 (*)    All other components within normal limits  COMPREHENSIVE METABOLIC PANEL - Abnormal; Notable for the following:    Sodium 128 (*)    Chloride 86 (*)    Glucose, Bld 664 (*)    Total Bilirubin 0.2 (*)    All other components within normal limits  URINALYSIS, ROUTINE W REFLEX MICROSCOPIC - Abnormal; Notable for the following:    Specific Gravity, Urine 1.033 (*)    Glucose, UA >1000 (*)    All other components within normal  limits  BLOOD GAS, VENOUS - Abnormal; Notable for the following:    pH, Ven 7.375 (*)    pCO2, Ven 38.1 (*)    pO2, Ven 150.0 (*)    Acid-base deficit 2.5 (*)    All other components within normal limits  URINE MICROSCOPIC-ADD ON - Abnormal;  Notable for the following:    Squamous Epithelial / LPF FEW (*)    All other components within normal limits  GLUCOSE, CAPILLARY - Abnormal; Notable for the following:    Glucose-Capillary 464 (*)    All other components within normal limits  GLUCOSE, CAPILLARY - Abnormal; Notable for the following:    Glucose-Capillary 271 (*)    All other components within normal limits  BASIC METABOLIC PANEL - Abnormal; Notable for the following:    Glucose, Bld 292 (*)    All other components within normal limits  BLOOD GAS, VENOUS - Abnormal; Notable for the following:    pH, Ven 7.433 (*)    pCO2, Ven 34.7 (*)    pO2, Ven 128.0 (*)    All other components within normal limits  GLUCOSE, CAPILLARY - Abnormal; Notable for the following:    Glucose-Capillary 213 (*)    All other components within normal limits  GLUCOSE, CAPILLARY - Abnormal; Notable for the following:    Glucose-Capillary 142 (*)    All other components within normal limits  POCT I-STAT, CHEM 8 - Abnormal; Notable for the following:    Potassium 3.4 (*)    BUN 4 (*)    Glucose, Bld 177 (*)    All other components within normal limits  LIPASE, BLOOD  D-DIMER, QUANTITATIVE   Imaging Review Dg Chest 2 View  01/16/2013   CLINICAL DATA:  Chest pain and shortness of breath.  EXAM: CHEST  2 VIEW  COMPARISON:  Chest radiograph May 12, 2012  FINDINGS: Cardiomediastinal silhouette is unremarkable. The lungs are clear without pleural effusions or focal consolidations. Pulmonary vasculature is unremarkable. Trachea projects midline and there is no pneumothorax. Soft tissue planes and included osseous structures are nonsuspicious. Bilateral nipple piercings. Multiple EKG lines overlie the patient  and may obscure subtle underlying pathology. Surgical clips at the thoracic inlet.  IMPRESSION: No active cardiopulmonary disease. Stable appearance of the chest from May 12, 2012.   Electronically Signed   By: Awilda Metro   On: 01/16/2013 04:18    EKG Interpretation    Date/Time:  Monday January 15 2013 23:19:45 EST Ventricular Rate:  117 PR Interval:  154 QRS Duration: 70 QT Interval:  307 QTC Calculation: 428 R Axis:   49 Text Interpretation:  Sinus tachycardia Low voltage, precordial leads Confirmed by WARD  DO, KRISTEN ([id number]) on 01/15/2013 11:24:12 PM            MDM   1. DKA (diabetic ketoacidoses)   2. Chest pain      First anion gap 22 Second anion gap at 5AM 16.2  Will continue fluids insulin drip an dD5 IV  Will recheck in 1 hour and transition to PO    Arman Filter, NP 01/16/13 2010   Medical screening examination/treatment/procedure(s) were conducted as a shared visit with non-physician practitioner(s) and myself.  I personally evaluated the patient during the encounter.  EKG Interpretation    Date/Time:  Monday January 15 2013 23:19:45 EST Ventricular Rate:  117 PR Interval:  154 QRS Duration: 70 QT Interval:  307 QTC Calculation: 428 R Axis:   49 Text Interpretation:  Sinus tachycardia Low voltage, precordial leads Confirmed by WARD  DO, KRISTEN ([id number]) on 01/15/2013 11:24:12 PM            Pt comes in with cc of chest pain x 5 days. EKG WNL. Pain is atypical -stabbing type pain. CXR clear, lung exam clear - no infiltrate, dimer negative.  Noted to have elevated sugar, AG of 22. Mild DKA. Pt started on insulin gtt - and plan is to admit patient for dka or rectify the gap, and observe in the ED and start oral challenge and discharge if stable.  Filed Vitals:   01/16/13 0634  BP: 107/71  Pulse: 82  Temp: 97.6 F (36.4 C)  Resp: 18    CRITICAL CARE Performed by: Derwood Kaplan   Total critical care time: 45 minutes -  DKA - insulin drip, iv fluids.  Critical care time was exclusive of separately billable procedures and treating other patients.  Critical care was necessary to treat or prevent imminent or life-threatening deterioration.  Critical care was time spent personally by me on the following activities: development of treatment plan with patient and/or surrogate as well as nursing, discussions with consultants, evaluation of patient's response to treatment, examination of patient, obtaining history from patient or surrogate, ordering and performing treatments and interventions, ordering and review of laboratory studies, ordering and review of radiographic studies, pulse oximetry and re-evaluation of patient's condition.     Derwood Kaplan, MD 01/27/13 [phone]

## 2013-01-24 ENCOUNTER — Ambulatory Visit
Admission: RE | Admit: 2013-01-24 | Discharge: 2013-01-24 | Disposition: A | Discharge status: Home / Self Care | Payer: Medicaid Other | Source: Ambulatory Visit | Attending: Internal Medicine | Admitting: Internal Medicine

## 2013-01-24 ENCOUNTER — Other Ambulatory Visit: Payer: Self-pay | Admitting: Internal Medicine

## 2013-01-24 DIAGNOSIS — R079 Chest pain, unspecified: Secondary | ICD-10-CM

## 2013-01-27 NOTE — ED Provider Notes (Signed)
Agree with the plan per Greta Doom and Dondra Spry, my note is attached to Kathrine Cords. Note.  Derwood Kaplan, MD 01/27/13 [phone]

## 2013-01-28 ENCOUNTER — Inpatient Hospital Stay (HOSPITAL_COMMUNITY)
Admission: AD | Admit: 2013-01-28 | Discharge: 2013-02-01 | DRG: 871 | Disposition: A | Discharge status: Home / Self Care | Payer: Medicaid Other | Attending: Pulmonary Disease | Admitting: Pulmonary Disease

## 2013-01-28 ENCOUNTER — Encounter (HOSPITAL_COMMUNITY): Payer: Self-pay | Admitting: Emergency Medicine

## 2013-01-28 ENCOUNTER — Emergency Department (HOSPITAL_COMMUNITY): Payer: Medicaid Other

## 2013-01-28 DIAGNOSIS — G9341 Metabolic encephalopathy: Secondary | ICD-10-CM

## 2013-01-28 DIAGNOSIS — A419 Sepsis, unspecified organism: Principal | ICD-10-CM

## 2013-01-28 DIAGNOSIS — R112 Nausea with vomiting, unspecified: Secondary | ICD-10-CM

## 2013-01-28 DIAGNOSIS — F411 Generalized anxiety disorder: Secondary | ICD-10-CM | POA: Diagnosis present

## 2013-01-28 DIAGNOSIS — Z23 Encounter for immunization: Secondary | ICD-10-CM

## 2013-01-28 DIAGNOSIS — E1065 Type 1 diabetes mellitus with hyperglycemia: Secondary | ICD-10-CM | POA: Diagnosis present

## 2013-01-28 DIAGNOSIS — I10 Essential (primary) hypertension: Secondary | ICD-10-CM | POA: Diagnosis present

## 2013-01-28 DIAGNOSIS — R109 Unspecified abdominal pain: Secondary | ICD-10-CM | POA: Diagnosis present

## 2013-01-28 DIAGNOSIS — Z794 Long term (current) use of insulin: Secondary | ICD-10-CM

## 2013-01-28 DIAGNOSIS — Z87891 Personal history of nicotine dependence: Secondary | ICD-10-CM

## 2013-01-28 DIAGNOSIS — B349 Viral infection, unspecified: Secondary | ICD-10-CM

## 2013-01-28 DIAGNOSIS — E860 Dehydration: Secondary | ICD-10-CM

## 2013-01-28 DIAGNOSIS — R4182 Altered mental status, unspecified: Secondary | ICD-10-CM

## 2013-01-28 DIAGNOSIS — E861 Hypovolemia: Secondary | ICD-10-CM | POA: Diagnosis present

## 2013-01-28 DIAGNOSIS — E1049 Type 1 diabetes mellitus with other diabetic neurological complication: Secondary | ICD-10-CM | POA: Diagnosis present

## 2013-01-28 DIAGNOSIS — R739 Hyperglycemia, unspecified: Secondary | ICD-10-CM

## 2013-01-28 DIAGNOSIS — D72829 Elevated white blood cell count, unspecified: Secondary | ICD-10-CM

## 2013-01-28 DIAGNOSIS — E079 Disorder of thyroid, unspecified: Secondary | ICD-10-CM

## 2013-01-28 DIAGNOSIS — D696 Thrombocytopenia, unspecified: Secondary | ICD-10-CM | POA: Diagnosis present

## 2013-01-28 DIAGNOSIS — E1010 Type 1 diabetes mellitus with ketoacidosis without coma: Secondary | ICD-10-CM

## 2013-01-28 DIAGNOSIS — Z79899 Other long term (current) drug therapy: Secondary | ICD-10-CM

## 2013-01-28 DIAGNOSIS — F410 Panic disorder [episodic paroxysmal anxiety] without agoraphobia: Secondary | ICD-10-CM | POA: Diagnosis present

## 2013-01-28 DIAGNOSIS — E876 Hypokalemia: Secondary | ICD-10-CM

## 2013-01-28 DIAGNOSIS — IMO0002 Reserved for concepts with insufficient information to code with codable children: Secondary | ICD-10-CM

## 2013-01-28 DIAGNOSIS — D649 Anemia, unspecified: Secondary | ICD-10-CM

## 2013-01-28 DIAGNOSIS — E871 Hypo-osmolality and hyponatremia: Secondary | ICD-10-CM

## 2013-01-28 DIAGNOSIS — E1143 Type 2 diabetes mellitus with diabetic autonomic (poly)neuropathy: Secondary | ICD-10-CM

## 2013-01-28 DIAGNOSIS — N390 Urinary tract infection, site not specified: Secondary | ICD-10-CM

## 2013-01-28 DIAGNOSIS — N179 Acute kidney failure, unspecified: Secondary | ICD-10-CM

## 2013-01-28 DIAGNOSIS — E1110 Type 2 diabetes mellitus with ketoacidosis without coma: Secondary | ICD-10-CM

## 2013-01-28 DIAGNOSIS — N12 Tubulo-interstitial nephritis, not specified as acute or chronic: Secondary | ICD-10-CM

## 2013-01-28 DIAGNOSIS — K219 Gastro-esophageal reflux disease without esophagitis: Secondary | ICD-10-CM

## 2013-01-28 LAB — COMPREHENSIVE METABOLIC PANEL
ALT: 16 U/L (ref 0–35)
AST: 27 U/L (ref 0–37)
Albumin: 4.3 g/dL (ref 3.5–5.2)
Alkaline Phosphatase: 104 U/L (ref 39–117)
CO2: <7 mEq/L — CL (ref 19–32)
Calcium: 8.4 mg/dL (ref 8.4–10.5)
Chloride: 91 mEq/L — ABNORMAL LOW (ref 96–112)
GFR calc Af Amer: >90 mL/min (ref >90)
GFR calc non Af Amer: >90 mL/min (ref >90)
Glucose, Bld: 448 mg/dL — ABNORMAL HIGH (ref 70–99)
Potassium: 4.6 mEq/L (ref 3.5–5.1)
Sodium: 132 mEq/L — ABNORMAL LOW (ref 135–145)
Total Bilirubin: 0.2 mg/dL — ABNORMAL LOW (ref 0.3–1.2)

## 2013-01-28 LAB — GLUCOSE, CAPILLARY
Glucose-Capillary: 216 mg/dL — ABNORMAL HIGH (ref 70–99)
Glucose-Capillary: 305 mg/dL — ABNORMAL HIGH (ref 70–99)
Glucose-Capillary: 369 mg/dL — ABNORMAL HIGH (ref 70–99)
Glucose-Capillary: 390 mg/dL — ABNORMAL HIGH (ref 70–99)
Glucose-Capillary: 406 mg/dL — ABNORMAL HIGH (ref 70–99)

## 2013-01-28 LAB — URINALYSIS, ROUTINE W REFLEX MICROSCOPIC
Bilirubin Urine: NEGATIVE
Glucose, UA: >1000 mg/dL — AB
Nitrite: NEGATIVE
Specific Gravity, Urine: 1.011 (ref 1.005–1.030)
Urobilinogen, UA: 0.2 mg/dL (ref 0.0–1.0)
pH: 5 (ref 5.0–8.0)

## 2013-01-28 LAB — RAPID URINE DRUG SCREEN, HOSP PERFORMED
Amphetamines: NOT DETECTED
Cocaine: NOT DETECTED
Opiates: NOT DETECTED
Tetrahydrocannabinol: NOT DETECTED

## 2013-01-28 LAB — CBC WITH DIFFERENTIAL/PLATELET
Basophils Relative: 0 % (ref 0–1)
Eosinophils Absolute: 0 10*3/uL (ref 0.0–0.7)
Eosinophils Relative: 0 % (ref 0–5)
HCT: 41.6 % (ref 36.0–46.0)
Hemoglobin: 13.4 g/dL (ref 12.0–15.0)
MCH: 32 pg (ref 26.0–34.0)
MCHC: 32.2 g/dL (ref 30.0–36.0)
Monocytes Absolute: 1.5 10*3/uL — ABNORMAL HIGH (ref 0.1–1.0)
Monocytes Relative: 6 % (ref 3–12)
Neutrophils Relative %: 77 % (ref 43–77)
RBC: 4.19 MIL/uL (ref 3.87–5.11)

## 2013-01-28 LAB — BASIC METABOLIC PANEL
CO2: <7 mEq/L — CL (ref 19–32)
Glucose, Bld: 305 mg/dL — ABNORMAL HIGH (ref 70–99)
Potassium: 4.7 mEq/L (ref 3.5–5.1)
Sodium: 135 mEq/L (ref 135–145)

## 2013-01-28 LAB — BLOOD GAS, ARTERIAL
Bicarbonate: 1.6 mEq/L — ABNORMAL LOW (ref 20.0–24.0)
O2 Saturation: 96.4 %
Patient temperature: 98.6
TCO2: 1.6 mmol/L (ref 0–100)
pH, Arterial: 6.895 — CL (ref 7.350–7.450)

## 2013-01-28 MED ORDER — SODIUM BICARBONATE 8.4 % IV SOLN
50.0000 meq | Freq: Once | INTRAVENOUS | Status: DC
Start: 2013-01-28 — End: 2013-01-28

## 2013-01-28 MED ORDER — SODIUM CHLORIDE 0.9 % IV SOLN: INTRAVENOUS | Status: DC

## 2013-01-28 MED ORDER — SODIUM BICARBONATE 8.4 % IV SOLN
50.0000 meq | Freq: Once | INTRAVENOUS | Status: AC
  Administered 2013-01-28: 50 meq via INTRAVENOUS

## 2013-01-28 MED ORDER — SODIUM BICARBONATE 8.4 % IV SOLN
INTRAVENOUS | Status: AC
  Filled 2013-01-28: qty 50

## 2013-01-28 MED ORDER — SODIUM CHLORIDE 0.9 % IV SOLN
1.0000 g | Freq: Once | INTRAVENOUS | Status: AC
  Administered 2013-01-28: 1 g via INTRAVENOUS
  Filled 2013-01-28: qty 10

## 2013-01-28 MED ORDER — HYDROMORPHONE HCL PF 1 MG/ML IJ SOLN: 0.5000 mg | Freq: Once | INTRAMUSCULAR | Status: DC

## 2013-01-28 MED ORDER — SODIUM BICARBONATE 8.4 % IV SOLN
100.0000 meq | Freq: Once | INTRAVENOUS | Status: AC
  Administered 2013-01-28: 100 meq via INTRAVENOUS

## 2013-01-28 MED ORDER — SODIUM CHLORIDE 0.9 % IV SOLN
Freq: Once | INTRAVENOUS | Status: AC
  Administered 2013-01-28: 19:00:00 via INTRAVENOUS

## 2013-01-28 MED ORDER — HYDROMORPHONE HCL PF 1 MG/ML IJ SOLN
0.5000 mg | Freq: Four times a day (QID) | INTRAMUSCULAR | Status: DC | PRN
  Administered 2013-01-28 – 2013-01-29 (×3): 0.5 mg via INTRAVENOUS
  Filled 2013-01-28 (×4): qty 1

## 2013-01-28 MED ORDER — DEXTROSE-NACL 5-0.45 % IV SOLN
INTRAVENOUS | Status: DC
  Administered 2013-01-28: 23:00:00 via INTRAVENOUS

## 2013-01-28 MED ORDER — INSULIN REGULAR BOLUS VIA INFUSION
0.0000 [IU] | Freq: Three times a day (TID) | INTRAVENOUS | Status: DC
  Filled 2013-01-28: qty 10

## 2013-01-28 MED ORDER — DEXTROSE 50 % IV SOLN: 25.0000 mL | INTRAVENOUS | Status: DC | PRN

## 2013-01-28 MED ORDER — SODIUM CHLORIDE 0.9 % IV SOLN
INTRAVENOUS | Status: AC
  Filled 2013-01-28: qty 10

## 2013-01-28 MED ORDER — SODIUM CHLORIDE 0.9 % IV SOLN
INTRAVENOUS | Status: AC
  Administered 2013-01-28: 1000 mL via INTRAVENOUS

## 2013-01-28 MED ORDER — FENTANYL CITRATE 0.05 MG/ML IJ SOLN
25.0000 ug | Freq: Once | INTRAMUSCULAR | Status: AC
  Administered 2013-01-28: 25 ug via INTRAVENOUS
  Filled 2013-01-28: qty 2

## 2013-01-28 MED ORDER — DEXTROSE-NACL 5-0.45 % IV SOLN: INTRAVENOUS | Status: DC

## 2013-01-28 MED ORDER — HYDROMORPHONE HCL PF 1 MG/ML IJ SOLN
0.5000 mg | Freq: Once | INTRAMUSCULAR | Status: AC
  Administered 2013-01-28: 0.5 mg via INTRAVENOUS

## 2013-01-28 MED ORDER — SALINE SPRAY 0.65 % NA SOLN
1.0000 | NASAL | Status: DC | PRN
  Filled 2013-01-28: qty 44

## 2013-01-28 MED ORDER — SODIUM CHLORIDE 0.9 % IV BOLUS (SEPSIS)
1000.0000 mL | Freq: Once | INTRAVENOUS | Status: AC
  Administered 2013-01-28: 1000 mL via INTRAVENOUS

## 2013-01-28 MED ORDER — SODIUM CHLORIDE 0.9 % IV SOLN
INTRAVENOUS | Status: DC
  Administered 2013-01-28: 7.4 [IU]/h via INTRAVENOUS
  Administered 2013-01-28: 7.8 [IU]/h via INTRAVENOUS
  Filled 2013-01-28 (×2): qty 1

## 2013-01-28 MED ORDER — SODIUM CHLORIDE 0.9 % IV SOLN
INTRAVENOUS | Status: AC
  Administered 2013-01-28: 22:00:00 via INTRAVENOUS
  Administered 2013-01-28: 150 mL via INTRAVENOUS

## 2013-01-28 MED ORDER — ONDANSETRON HCL 4 MG/2ML IJ SOLN
4.0000 mg | Freq: Three times a day (TID) | INTRAMUSCULAR | Status: AC | PRN
  Administered 2013-01-28 – 2013-01-29 (×2): 4 mg via INTRAVENOUS
  Filled 2013-01-28 (×2): qty 2

## 2013-01-28 MED ORDER — ONDANSETRON HCL 4 MG/2ML IJ SOLN
4.0000 mg | Freq: Once | INTRAMUSCULAR | Status: AC
  Administered 2013-01-28: 4 mg via INTRAVENOUS
  Filled 2013-01-28: qty 2

## 2013-01-28 MED ORDER — SODIUM CHLORIDE 0.9 % IV SOLN
INTRAVENOUS | Status: DC
  Administered 2013-01-28: 3.6 [IU]/h via INTRAVENOUS
  Filled 2013-01-28: qty 1

## 2013-01-28 MED ORDER — SODIUM BICARBONATE 8.4 % IV SOLN
INTRAVENOUS | Status: AC
  Administered 2013-01-28: 50 meq via INTRAVENOUS
  Filled 2013-01-28: qty 50

## 2013-01-28 MED ORDER — POTASSIUM CHLORIDE 10 MEQ/100ML IV SOLN
10.0000 meq | INTRAVENOUS | Status: AC
  Administered 2013-01-28 (×2): 10 meq via INTRAVENOUS
  Filled 2013-01-28 (×2): qty 100

## 2013-01-28 MED ORDER — DEXTROSE 5 % IV SOLN
1.0000 g | Freq: Three times a day (TID) | INTRAVENOUS | Status: DC
  Administered 2013-01-29 – 2013-01-30 (×5): 1 g via INTRAVENOUS
  Filled 2013-01-28 (×6): qty 1

## 2013-01-28 MED ORDER — VANCOMYCIN HCL 500 MG IV SOLR
500.0000 mg | Freq: Three times a day (TID) | INTRAVENOUS | Status: DC
  Administered 2013-01-29 – 2013-01-30 (×5): 500 mg via INTRAVENOUS
  Filled 2013-01-28 (×6): qty 500

## 2013-01-28 MED ORDER — DEXTROSE 5 % IV SOLN: 1.0000 g | INTRAVENOUS | Status: DC

## 2013-01-28 MED ORDER — HEPARIN SODIUM (PORCINE) 5000 UNIT/ML IJ SOLN
5000.0000 [IU] | Freq: Three times a day (TID) | INTRAMUSCULAR | Status: DC
  Administered 2013-01-28 – 2013-01-31 (×8): 5000 [IU] via SUBCUTANEOUS
  Filled 2013-01-28 (×11): qty 1

## 2013-01-28 NOTE — ED Notes (Signed)
Pt up to restroom unable to void at this time

## 2013-01-28 NOTE — ED Notes (Signed)
CRITICAL VALUE ALERT  Critical value received:  CO 2< 7  Date of notification:  01/28/2013  Time of notification:  1825  Critical value read back:yes  Nurse who received alert:  Mosie Lukes   MD notified (1st page):  Rodena Medin, PA   Time of first page:  1826  MD notified (2nd page):NA  Time of second page:NA  Responding MD:  NA  Time MD responded:  NA

## 2013-01-28 NOTE — ED Notes (Signed)
Bed: WA03 Expected date:  Expected time:  Means of arrival:  Comments: EMS-hyperglycemia 

## 2013-01-28 NOTE — ED Notes (Signed)
Patient arrive via EMS. Patient reported N/V and chillis CBG 397. Patient took insuline prior to EMS arrival. EMS noted frurity smell on breath. Patient was placed on O2. Also complains of abdominal pain. Patient was given 500 NS and 4mg  of zofran.

## 2013-01-28 NOTE — ED Notes (Signed)
Insulin gtt started at this time. Verified by Darl Pikes, RN

## 2013-01-28 NOTE — ED Notes (Signed)
Pt voided on bedpan but also had stool will attempt UA at a later time

## 2013-01-28 NOTE — Progress Notes (Signed)
eLink Physician-Brief Progress Note Patient Name: ARENA LINDAHL DOB: 10/20/1980 MRN: [id number]  Date of Service  01/28/2013   HPI/Events of Note   Pain Acidosis, worsening Tachypnea Hypothermia Sinus drainage hypocalcemia   eICU Interventions  Culture Empiric vanc/cefepime abg now Replete Calcium, then more bicarb Dilaudid x1 now Fellow re-routed to see ASAP   Intervention Category Major Interventions: Acid-Base disturbance - evaluation and management;Electrolyte abnormality - evaluation and management  Derek Huneycutt 01/28/2013, 11:34 PM

## 2013-01-28 NOTE — ED Notes (Signed)
Tim, RN at bedside to place IV

## 2013-01-28 NOTE — ED Notes (Signed)
Patient bed changed. Large stool and urine. Patient is on menstrual cycle.

## 2013-01-28 NOTE — Progress Notes (Addendum)
ANTIBIOTIC CONSULT NOTE - INITIAL  Pharmacy Consult for Vancomycin & Cefepime Indication: Sepsis  Allergies  Allergen Reactions  . Scallops [Shellfish Allergy] Anaphylaxis  . Morphine And Related Itching  . Tylenol [Acetaminophen] Itching  . Latex Rash    Patient Measurements: Height: 5\' 6"  (167.6 cm) Weight: 125 lb (56.7 kg) IBW/kg (Calculated) : 59.3  Vital Signs: Temp: 95.6 F (35.3 C) (12/07 2200) Temp src: Rectal (12/07 2200) BP: 130/85 mmHg (12/07 2230) Pulse Rate: 121 (12/07 2230) Intake/Output from previous day:   Intake/Output from this shift: Total I/O In: 1543.8 [I.V.:1343.8; IV Piggyback:200] Out: 1950 [Urine:1950]  Labs:  Recent Labs  01/28/13 1744 01/28/13 2145  WBC 24.6*  --   HGB 13.4  --   PLT 371  --   CREATININE 0.80 0.77   Estimated Creatinine Clearance: 90.4 ml/min (by C-G formula based on Cr of 0.77). No results found for this basename: VANCOTROUGH, Leodis Binet, VANCORANDOM, GENTTROUGH, GENTPEAK, GENTRANDOM, TOBRATROUGH, TOBRAPEAK, TOBRARND, AMIKACINPEAK, AMIKACINTROU, AMIKACIN,  in the last 72 hours   Microbiology: Recent Results (from the past 720 hour(s))  MRSA PCR SCREENING     Status: None   Collection Time    01/28/13  8:58 PM      Result Value Range Status   MRSA by PCR NEGATIVE  NEGATIVE Final   Comment:            The GeneXpert MRSA Assay (FDA     approved for NASAL specimens     only), is one component of a     comprehensive MRSA colonization     surveillance program. It is not     intended to diagnose MRSA     infection nor to guide or     monitor treatment for     MRSA infections.    Medical History: Past Medical History  Diagnosis Date  . Diabetes mellitus   . Thyroid disease   . Back pain   . Panic attack   . PONV (postoperative nausea and vomiting)   . Anxiety   . GERD (gastroesophageal reflux disease)   . DM gastroparesis     Medications:  Scheduled:  . [COMPLETED] sodium chloride   Intravenous STAT  .  calcium gluconate  1 g Intravenous Once  . [START ON 01/29/2013] ceFEPime (MAXIPIME) IV  1 g Intravenous Q8H  . heparin  5,000 Units Subcutaneous Q8H  . [START ON 01/29/2013] insulin regular  0-10 Units Intravenous TID WC  . sodium bicarbonate      . sodium bicarbonate  100 mEq Intravenous Once  . sodium bicarbonate  50 mEq Intravenous Once  . calcium gluconate 1 GM IV      . [START ON 01/29/2013] vancomycin  500 mg Intravenous Q8H   Infusions:  . sodium chloride    . sodium chloride    . dextrose 5 % and 0.45% NaCl    . dextrose 5 % and 0.45% NaCl 100 mL/hr at 01/28/13 2243  . insulin (NOVOLIN-R) infusion 7.8 Units/hr (01/28/13 2340)   Assessment:  32 yr female presented in ED with hyperglycemia (CBG 397), complaints of abdominal pain/nausea, SOB.  Admitted to ICU with diagnosis of DKA  Patient with worsening acidosis, hypocalcemia, Temp 95.6F, WBC 24.6 K, tachypnea  IV Vancomycin & Cefepime ordered empirically to begin for sepsis with unknown origin  Blood & urine cultures ordered  Goal of Therapy:  Vancomycin trough level 15-20 mcg/ml  Plan:  Measure antibiotic drug levels at steady state Follow up culture results Cefepime  1gm IV q8h Vancomycin 500mg  IV q8h  Brown Dunlap, Joselyn Glassman, PharmD 01/28/2013,11:57 PM  ADDENDUM:  CCM MD has added Levaquin per pharmacy dosing for empiric coverage of sepsis and possible pneumonia (due to complaint of cough)  PLAN:  Levaquin 750mg  IV q24h  Terrilee Files, PharmD 01/29/13

## 2013-01-28 NOTE — ED Provider Notes (Signed)
CSN: [id number]     Arrival date & time 01/28/13  1610 History   First MD Initiated Contact with Patient 01/28/13 1619     Chief Complaint  Patient presents with  . Hyperglycemia    cbg 397   (Consider location/radiation/quality/duration/timing/severity/associated sxs/prior Treatment) HPI Comments: The patient is a 32 year-old female with a past medical history of IDDM and several episodes of DKA presenting the Emergency Department with a chief complaint of DKA.  She reports abdominal pain nausea, shortness of breath since this morning. She denies a history of cough, URI symptoms, dysuria, fever or chills.  She reports she has been compliant with her insulin.     Patient is a 32 y.o. female presenting with hyperglycemia. The history is provided by the patient, a friend and medical records. No language interpreter was used.  Hyperglycemia Associated symptoms: abdominal pain, increased thirst, nausea and shortness of breath   Associated symptoms: no fever     Past Medical History  Diagnosis Date  . Diabetes mellitus   . Thyroid disease   . Back pain   . Panic attack   . PONV (postoperative nausea and vomiting)   . Anxiety   . GERD (gastroesophageal reflux disease)   . DM gastroparesis    Past Surgical History  Procedure Laterality Date  . Laparoscopy      age 24  . Back surgery      age 68  . Tooth extraction    . Root canal  10/10/12   Family History  Problem Relation Age of Onset  . Other Mother   . Other Father    History  Substance Use Topics  . Smoking status: Former Smoker    Quit date: 04/19/2000  . Smokeless tobacco: Former Neurosurgeon  . Alcohol Use: Yes     Comment: social, none now 03/01/2012   OB History   Grav Para Term Preterm Abortions TAB SAB Ect Mult Living                 Review of Systems  Constitutional: Negative for fever and chills.  Eyes: Negative for visual disturbance.  Respiratory: Positive for shortness of breath. Negative for cough and  wheezing.   Gastrointestinal: Positive for nausea and abdominal pain.  Endocrine: Positive for polydipsia.    Allergies  Scallops; Morphine and related; Tylenol; and Latex  Home Medications   Current Outpatient Rx  Name  Route  Sig  Dispense  Refill  . cloNIDine (CATAPRES) 0.1 MG tablet   Oral   Take 0.1 mg by mouth 2 (two) times daily.         Marland Kitchen gabapentin (NEURONTIN) 100 MG capsule   Oral   Take 200 mg by mouth 3 (three) times daily.          Marland Kitchen HYDROcodone-acetaminophen (NORCO/VICODIN) 5-325 MG per tablet   Oral   Take 1 tablet by mouth every 6 (six) hours as needed for moderate pain.         Marland Kitchen insulin aspart (NOVOLOG) 100 UNIT/ML injection   Subcutaneous   Inject 1-3 Units into the skin 3 (three) times daily with meals. Sliding scale         . insulin glargine (LANTUS) 100 UNIT/ML injection   Subcutaneous   Inject 22 Units into the skin every morning.          Marland Kitchen lisinopril (PRINIVIL,ZESTRIL) 2.5 MG tablet   Oral   Take 2.5 mg by mouth every evening.          Marland Kitchen  lovastatin (MEVACOR) 20 MG tablet   Oral   Take 20 mg by mouth at bedtime.         . Multiple Vitamin (MULTIVITAMIN WITH MINERALS) TABS   Oral   Take 1 tablet by mouth every morning. Diabetic support vitamin         . niacin 500 MG tablet   Oral   Take 1 tablet (500 mg total) by mouth at bedtime.   30 tablet   1   . ondansetron (ZOFRAN-ODT) 8 MG disintegrating tablet   Oral   Take 8 mg by mouth every 8 (eight) hours as needed for nausea or vomiting.         Marland Kitchen glucagon (GLUCAGON EMERGENCY) 1 MG injection   Intramuscular   Inject 1 mg into the muscle once as needed (severe hypoglycemia).          BP 137/86  Pulse 110  Temp(Src) 97.5 F (36.4 C) (Oral)  Resp 28  SpO2 88%  LMP 01/28/2013 Physical Exam  Nursing note and vitals reviewed. Constitutional: She appears well-developed and well-nourished. She appears distressed.  Vomiting in ED  HENT:  Head: Normocephalic and  atraumatic.  Eyes: No scleral icterus.  Neck: Neck supple.  Cardiovascular: Regular rhythm.  Tachycardia present.   Pulmonary/Chest: No stridor. Tachypnea noted. She has no wheezes. She has no rhonchi.  Kussmaul breathing.  Patient is able to speak in short sentences.   Abdominal: Soft. Normal appearance. She exhibits no distension. There is generalized tenderness.  Musculoskeletal: She exhibits no edema.  Neurological: She is alert.  Skin: Skin is warm.    ED Course  Procedures (including critical care time) Labs Review Labs Reviewed  CBC WITH DIFFERENTIAL - Abnormal; Notable for the following:    WBC 24.6 (*)    Neutro Abs 18.9 (*)    Lymphs Abs 4.2 (*)    Monocytes Absolute 1.5 (*)    All other components within normal limits  COMPREHENSIVE METABOLIC PANEL - Abnormal; Notable for the following:    Sodium 132 (*)    Chloride 91 (*)    CO2 <7 (*)    Glucose, Bld 448 (*)    Total Bilirubin 0.2 (*)    All other components within normal limits  BLOOD GAS, ARTERIAL - Abnormal; Notable for the following:    pH, Arterial 6.895 (*)    pCO2 arterial 8.6 (*)    pO2, Arterial 144.0 (*)    Bicarbonate 1.6 (*)    All other components within normal limits  LACTIC ACID, PLASMA - Abnormal; Notable for the following:    Lactic Acid, Venous 3.0 (*)    All other components within normal limits  GLUCOSE, CAPILLARY - Abnormal; Notable for the following:    Glucose-Capillary 406 (*)    All other components within normal limits  GLUCOSE, CAPILLARY - Abnormal; Notable for the following:    Glucose-Capillary 422 (*)    All other components within normal limits  URINALYSIS, ROUTINE W REFLEX MICROSCOPIC  URINE RAPID DRUG SCREEN (HOSP PERFORMED)   Imaging Review Dg Chest Portable 1 View  01/28/2013   CLINICAL DATA:  Hyperglycemia  EXAM: PORTABLE CHEST - 1 VIEW  COMPARISON:  01/24/2013  FINDINGS: Cardiomediastinal silhouette is stable. No acute infiltrate or pleural effusion. No pulmonary  edema. Bony thorax is unremarkable.  IMPRESSION: No active disease.   Electronically Signed   By: Natasha Mead M.D.   On: 01/28/2013 16:56    EKG Interpretation   None  MDM   1. DKA (diabetic ketoacidoses)     Pt with a history of DKA presents with CBG 397 per EMS, kussmaul breathing, nausea, tachycardia.  Fluid resuscitation, Chest XR and urine for possible precipitation factor. Discussed patient condition and history with Dr. Jeraldine Loots who agrees on current work-up and treatment plan for the patient.  [id number]  ABG- pH 6.895, pCO2- 8.6, pO2 144, WBC-24.6 Dr. Henry Russel to admit to ICU. Meds given in ED:  Medications  dextrose 5 %-0.45 % sodium chloride infusion (not administered)  insulin regular bolus via infusion 0-10 Units (not administered)  insulin regular (NOVOLIN R,HUMULIN R) 1 Units/mL in sodium chloride 0.9 % 100 mL infusion (3.6 Units/hr Intravenous New Bag/Given 01/28/13 1917)  dextrose 50 % solution 25 mL (not administered)  0.9 %  sodium chloride infusion (not administered)  sodium chloride 0.9 % bolus 1,000 mL (0 mLs Intravenous Stopped 01/28/13 1811)  ondansetron (ZOFRAN) injection 4 mg (4 mg Intravenous Given 01/28/13 1642)  sodium chloride 0.9 % bolus 1,000 mL (1,000 mLs Intravenous New Bag/Given 01/28/13 1811)  fentaNYL (SUBLIMAZE) injection 25 mcg (25 mcg Intravenous Given 01/28/13 1809)  0.9 %  sodium chloride infusion ( Intravenous New Bag/Given 01/28/13 1855)  fentaNYL (SUBLIMAZE) injection 25 mcg (25 mcg Intravenous Given 01/28/13 1930)  ondansetron (ZOFRAN) injection 4 mg (4 mg Intravenous Given 01/28/13 1930)    New Prescriptions   No medications on file      Clabe Seal, PA-C 01/31/13 1605

## 2013-01-28 NOTE — ED Notes (Signed)
Pt from home reports that she called EMS because she had "Kusmall's breathing." Pt states that she has been in DKA "18 times and I know what it feels like." Pt states she has abd pain, N/V, but denies diarrhea. Pt reports that she has been compliant with insulin, but cannot get it under control. Pt is A&O and in NAD

## 2013-01-29 ENCOUNTER — Encounter (HOSPITAL_COMMUNITY): Payer: Self-pay | Admitting: Internal Medicine

## 2013-01-29 DIAGNOSIS — A419 Sepsis, unspecified organism: Secondary | ICD-10-CM

## 2013-01-29 DIAGNOSIS — E1010 Type 1 diabetes mellitus with ketoacidosis without coma: Secondary | ICD-10-CM

## 2013-01-29 LAB — BLOOD GAS, ARTERIAL
Acid-Base Excess: 0 mmol/L (ref 0.0–2.0)
Acid-base deficit: 15 mmol/L — ABNORMAL HIGH (ref 0.0–2.0)
Acid-base deficit: 28.7 mmol/L — ABNORMAL HIGH (ref 0.0–2.0)
Bicarbonate: 10.1 mEq/L — ABNORMAL LOW (ref 20.0–24.0)
Bicarbonate: 2.6 mEq/L — ABNORMAL LOW (ref 20.0–24.0)
Drawn by: 31814
Drawn by: 31814
FIO2: 0.28 %
O2 Saturation: 97.6 %
O2 Saturation: 98.3 %
Patient temperature: 97.5
Patient temperature: 98.4
Patient temperature: 99.8
TCO2: 1.7 mmol/L (ref 0–100)
TCO2: 2.6 mmol/L (ref 0–100)
TCO2: 9.4 mmol/L (ref 0–100)
pCO2 arterial: 10.6 mmHg — CL (ref 35.0–45.0)
pCO2 arterial: 9.5 mmHg — CL (ref 35.0–45.0)
pH, Arterial: 6.842 — CL (ref 7.350–7.450)
pH, Arterial: 7.012 — CL (ref 7.350–7.450)
pO2, Arterial: 103 mmHg — ABNORMAL HIGH (ref 80.0–100.0)
pO2, Arterial: 132 mmHg — ABNORMAL HIGH (ref 80.0–100.0)
pO2, Arterial: 150 mmHg — ABNORMAL HIGH (ref 80.0–100.0)

## 2013-01-29 LAB — GLUCOSE, CAPILLARY
Glucose-Capillary: 118 mg/dL — ABNORMAL HIGH (ref 70–99)
Glucose-Capillary: 121 mg/dL — ABNORMAL HIGH (ref 70–99)
Glucose-Capillary: 123 mg/dL — ABNORMAL HIGH (ref 70–99)
Glucose-Capillary: 128 mg/dL — ABNORMAL HIGH (ref 70–99)
Glucose-Capillary: 129 mg/dL — ABNORMAL HIGH (ref 70–99)
Glucose-Capillary: 129 mg/dL — ABNORMAL HIGH (ref 70–99)
Glucose-Capillary: 132 mg/dL — ABNORMAL HIGH (ref 70–99)
Glucose-Capillary: 143 mg/dL — ABNORMAL HIGH (ref 70–99)
Glucose-Capillary: 147 mg/dL — ABNORMAL HIGH (ref 70–99)
Glucose-Capillary: 155 mg/dL — ABNORMAL HIGH (ref 70–99)
Glucose-Capillary: 156 mg/dL — ABNORMAL HIGH (ref 70–99)
Glucose-Capillary: 160 mg/dL — ABNORMAL HIGH (ref 70–99)
Glucose-Capillary: 160 mg/dL — ABNORMAL HIGH (ref 70–99)

## 2013-01-29 LAB — BASIC METABOLIC PANEL
BUN: 15 mg/dL (ref 6–23)
BUN: 16 mg/dL (ref 6–23)
BUN: 14 mg/dL (ref 6–23)
BUN: 12 mg/dL (ref 6–23)
CO2: <7 mEq/L — CL (ref 19–32)
CO2: <7 mEq/L — CL (ref 19–32)
CO2: 13 mEq/L — ABNORMAL LOW (ref 19–32)
Calcium: 7.3 mg/dL — ABNORMAL LOW (ref 8.4–10.5)
Calcium: 7.3 mg/dL — ABNORMAL LOW (ref 8.4–10.5)
Calcium: 7 mg/dL — ABNORMAL LOW (ref 8.4–10.5)
Calcium: 7.1 mg/dL — ABNORMAL LOW (ref 8.4–10.5)
Calcium: 7.5 mg/dL — ABNORMAL LOW (ref 8.4–10.5)
Chloride: 106 mEq/L (ref 96–112)
Chloride: 109 mEq/L (ref 96–112)
Chloride: 102 mEq/L (ref 96–112)
Chloride: 107 mEq/L (ref 96–112)
Chloride: 108 mEq/L (ref 96–112)
Creatinine, Ser: 0.83 mg/dL (ref 0.50–1.10)
Creatinine, Ser: 0.74 mg/dL (ref 0.50–1.10)
Creatinine, Ser: 0.68 mg/dL (ref 0.50–1.10)
GFR calc Af Amer: >90 mL/min (ref >90)
GFR calc Af Amer: >90 mL/min (ref >90)
GFR calc Af Amer: >90 mL/min (ref >90)
GFR calc Af Amer: >90 mL/min (ref >90)
GFR calc Af Amer: >90 mL/min (ref >90)
GFR calc non Af Amer: >90 mL/min (ref >90)
GFR calc non Af Amer: >90 mL/min (ref >90)
GFR calc non Af Amer: >90 mL/min (ref >90)
GFR calc non Af Amer: >90 mL/min (ref >90)
Glucose, Bld: 116 mg/dL — ABNORMAL HIGH (ref 70–99)
Glucose, Bld: 158 mg/dL — ABNORMAL HIGH (ref 70–99)
Glucose, Bld: 211 mg/dL — ABNORMAL HIGH (ref 70–99)
Glucose, Bld: 151 mg/dL — ABNORMAL HIGH (ref 70–99)
Glucose, Bld: 130 mg/dL — ABNORMAL HIGH (ref 70–99)
Potassium: 3.9 mEq/L (ref 3.5–5.1)
Potassium: 4.1 mEq/L (ref 3.5–5.1)
Potassium: 2.7 mEq/L — CL (ref 3.5–5.1)
Potassium: 2.9 mEq/L — ABNORMAL LOW (ref 3.5–5.1)
Potassium: 3 mEq/L — ABNORMAL LOW (ref 3.5–5.1)
Sodium: 143 mEq/L (ref 135–145)
Sodium: 136 mEq/L (ref 135–145)
Sodium: 134 mEq/L — ABNORMAL LOW (ref 135–145)
Sodium: 136 mEq/L (ref 135–145)

## 2013-01-29 MED ORDER — CLONIDINE HCL 0.1 MG PO TABS
0.1000 mg | ORAL_TABLET | Freq: Two times a day (BID) | ORAL | Status: DC
  Administered 2013-01-29: 0.1 mg via ORAL
  Filled 2013-01-29 (×3): qty 1

## 2013-01-29 MED ORDER — LEVOFLOXACIN IN D5W 500 MG/100ML IV SOLN: 500.0000 mg | INTRAVENOUS | Status: DC

## 2013-01-29 MED ORDER — SODIUM BICARBONATE 8.4 % IV SOLN
INTRAVENOUS | Status: AC
  Administered 2013-01-29: 50 meq
  Filled 2013-01-29: qty 50

## 2013-01-29 MED ORDER — DEXTROSE 10 % IV SOLN
INTRAVENOUS | Status: DC
  Administered 2013-01-29: 17:00:00 via INTRAVENOUS
  Administered 2013-01-29: 100 mL via INTRAVENOUS

## 2013-01-29 MED ORDER — LEVOFLOXACIN IN D5W 750 MG/150ML IV SOLN
750.0000 mg | INTRAVENOUS | Status: DC
  Administered 2013-01-29 – 2013-01-31 (×3): 750 mg via INTRAVENOUS
  Filled 2013-01-29 (×3): qty 150

## 2013-01-29 MED ORDER — POTASSIUM CHLORIDE 10 MEQ/100ML IV SOLN
10.0000 meq | INTRAVENOUS | Status: DC
  Administered 2013-01-29: 10 meq via INTRAVENOUS
  Filled 2013-01-29 (×5): qty 100

## 2013-01-29 MED ORDER — HYDROMORPHONE HCL 2 MG PO TABS
1.0000 mg | ORAL_TABLET | ORAL | Status: DC | PRN
  Administered 2013-01-29: 1 mg via ORAL
  Administered 2013-01-30: 0.5 mg via ORAL
  Filled 2013-01-29 (×2): qty 1

## 2013-01-29 MED ORDER — POTASSIUM CHLORIDE CRYS ER 20 MEQ PO TBCR
40.0000 meq | EXTENDED_RELEASE_TABLET | Freq: Once | ORAL | Status: AC
  Administered 2013-01-29: 40 meq via ORAL
  Filled 2013-01-29: qty 2

## 2013-01-29 MED ORDER — ONDANSETRON HCL 4 MG/2ML IJ SOLN
4.0000 mg | Freq: Four times a day (QID) | INTRAMUSCULAR | Status: AC | PRN
  Administered 2013-01-29 (×2): 4 mg via INTRAVENOUS
  Filled 2013-01-29 (×2): qty 2

## 2013-01-29 MED ORDER — SODIUM CHLORIDE 0.9 % IV BOLUS (SEPSIS)
1000.0000 mL | Freq: Once | INTRAVENOUS | Status: AC
Start: 2013-01-29 — End: 2013-01-29
  Administered 2013-01-29: 1000 mL via INTRAVENOUS

## 2013-01-29 MED ORDER — INSULIN GLARGINE 100 UNIT/ML ~~LOC~~ SOLN
20.0000 [IU] | SUBCUTANEOUS | Status: DC
  Administered 2013-01-29: 20 [IU] via SUBCUTANEOUS
  Filled 2013-01-29: qty 0.2

## 2013-01-29 MED ORDER — INSULIN ASPART 100 UNIT/ML ~~LOC~~ SOLN: 2.0000 [IU] | SUBCUTANEOUS | Status: DC

## 2013-01-29 MED ORDER — SODIUM BICARBONATE 8.4 % IV SOLN
INTRAVENOUS | Status: DC
  Administered 2013-01-29: 21:00:00 via INTRAVENOUS
  Filled 2013-01-29 (×3): qty 150

## 2013-01-29 MED ORDER — DEXTROSE 10 % IV SOLN: INTRAVENOUS | Status: DC | PRN

## 2013-01-29 MED ORDER — POTASSIUM CHLORIDE 10 MEQ/50ML IV SOLN
10.0000 meq | INTRAVENOUS | Status: DC
  Administered 2013-01-29: 10 meq via INTRAVENOUS
  Filled 2013-01-29 (×3): qty 50

## 2013-01-29 MED ORDER — GABAPENTIN 100 MG PO CAPS
200.0000 mg | ORAL_CAPSULE | Freq: Three times a day (TID) | ORAL | Status: DC
  Administered 2013-01-29 – 2013-02-01 (×11): 200 mg via ORAL
  Filled 2013-01-29 (×12): qty 2

## 2013-01-29 MED ORDER — HYDROMORPHONE HCL PF 1 MG/ML IJ SOLN
0.5000 mg | INTRAMUSCULAR | Status: DC | PRN
  Administered 2013-01-29 – 2013-02-01 (×22): 0.5 mg via INTRAVENOUS
  Filled 2013-01-29 (×22): qty 1

## 2013-01-29 MED ORDER — ACETAMINOPHEN 325 MG PO TABS: 650.0000 mg | ORAL_TABLET | Freq: Four times a day (QID) | ORAL | Status: DC | PRN

## 2013-01-29 MED ORDER — SODIUM CHLORIDE 0.9 % IV BOLUS (SEPSIS)
2000.0000 mL | Freq: Once | INTRAVENOUS | Status: AC
  Administered 2013-01-29: 2000 mL via INTRAVENOUS

## 2013-01-29 MED ORDER — OSELTAMIVIR PHOSPHATE 75 MG PO CAPS
75.0000 mg | ORAL_CAPSULE | Freq: Two times a day (BID) | ORAL | Status: DC
  Administered 2013-01-29 – 2013-01-30 (×4): 75 mg via ORAL
  Filled 2013-01-29 (×7): qty 1

## 2013-01-29 MED ORDER — INFLUENZA VAC SPLIT QUAD 0.5 ML IM SUSP
0.5000 mL | INTRAMUSCULAR | Status: AC
  Administered 2013-01-30: 0.5 mL via INTRAMUSCULAR
  Filled 2013-01-29 (×2): qty 0.5

## 2013-01-29 NOTE — Progress Notes (Signed)
eLink Physician-Brief Progress Note Patient Name: ROSSANNA SPITZLEY DOB: 05-25-1980 MRN: [id number]  Date of Service  01/29/2013   HPI/Events of Note   Anion gap closed  eICU Interventions  Switch from insulin gtt to lantus with ssi per ICU hyperglycemia orderset Advance diet   Intervention Category Major Interventions: Acid-Base disturbance - evaluation and management  Cambree Hendrix 01/29/2013, 4:40 AM

## 2013-01-29 NOTE — Significant Event (Signed)
BMET    Component Value Date/Time   NA 134* 01/29/2013 1800   K 3.0* 01/29/2013 1800   CL 106 01/29/2013 1800   CO2 14* 01/29/2013 1800   GLUCOSE 130* 01/29/2013 1800   BUN 12 01/29/2013 1800   CREATININE 0.66 01/29/2013 1800   CALCIUM 7.1* 01/29/2013 1800    Has GAP and non GAP metabolic acidosis.  Will add HCO3 to IV fluids.  KCL replacement already ordered.  Will also adjust pain medications now that she is taking oral medications.  Coralyn Helling, MD 01/29/2013, 8:26 PM

## 2013-01-29 NOTE — H&P (Addendum)
PULMONARY  / CRITICAL CARE MEDICINE HISTORY AND PHYSICAL EXAMINATION  Name: [name] MRN: [id number] DOB: 05-31-1980    ADMISSION DATE:  01/28/2013  CHIEF COMPLAINT:  DKA  BRIEF PATIENT DESCRIPTION: 58 F with DKA in setting of likely infection not yet identified.  SIGNIFICANT EVENTS / STUDIES:  1. AG 25, bicarb < 7 on admission 2. ABG on admission 6.895/8.6/144/1.6  LINES / TUBES: 1. PIV  CULTURES: 1. Blood Cultures 2. Urine Culture 3. Sputum Culture 4. RVP  ANTIBIOTICS: 1. Cefepime 12/7 - 2. Vanc 12/7 - 3. Levaquin 12/7-  HISTORY OF PRESENT ILLNESS:  [name] is a 32 year old female with type 1 diabetes mellitus who presents for "DKA". She was in her usual state of health until yesterday when she began noticing nausea, severe abdominal pain, and vomiting. In addition, she developed subsequent chills and shortness of breath. She has chest pain in the setting of vomiting however denies exertional chest pain. She's had no fevers. Her daughter has been sick recently. She denies associated myalgias. She has been taking her insulin daily until today.  PAST MEDICAL HISTORY :  Past Medical History  Diagnosis Date  . Diabetes mellitus   . Thyroid disease   . Back pain   . Panic attack   . PONV (postoperative nausea and vomiting)   . Anxiety   . GERD (gastroesophageal reflux disease)   . DM gastroparesis     Past Surgical History  Procedure Laterality Date  . Laparoscopy      age 21  . Back surgery      age 21  . Tooth extraction    . Root canal  10/10/12    Prior to Admission medications   Medication Sig Start Date End Date Taking? Authorizing Provider  cloNIDine (CATAPRES) 0.1 MG tablet Take 0.1 mg by mouth 2 (two) times daily.   Yes Historical Provider, MD  gabapentin (NEURONTIN) 100 MG capsule Take 200 mg by mouth 3 (three) times daily.    Yes Historical Provider, MD  HYDROcodone-acetaminophen (NORCO/VICODIN) 5-325 MG per tablet Take 1 tablet by mouth  every 6 (six) hours as needed for moderate pain.   Yes Historical Provider, MD  insulin aspart (NOVOLOG) 100 UNIT/ML injection Inject 1-3 Units into the skin 3 (three) times daily with meals. Sliding scale   Yes Historical Provider, MD  insulin glargine (LANTUS) 100 UNIT/ML injection Inject 22 Units into the skin every morning.    Yes Historical Provider, MD  lisinopril (PRINIVIL,ZESTRIL) 2.5 MG tablet Take 2.5 mg by mouth every evening.    Yes Historical Provider, MD  lovastatin (MEVACOR) 20 MG tablet Take 20 mg by mouth at bedtime.   Yes Historical Provider, MD  Multiple Vitamin (MULTIVITAMIN WITH MINERALS) TABS Take 1 tablet by mouth every morning. Diabetic support vitamin   Yes Historical Provider, MD  niacin 500 MG tablet Take 1 tablet (500 mg total) by mouth at bedtime. 04/30/12  Yes Catarina Hartshorn, MD  ondansetron (ZOFRAN-ODT) 8 MG disintegrating tablet Take 8 mg by mouth every 8 (eight) hours as needed for nausea or vomiting.   Yes Historical Provider, MD  glucagon (GLUCAGON EMERGENCY) 1 MG injection Inject 1 mg into the muscle once as needed (severe hypoglycemia).    Historical Provider, MD    Allergies  Allergen Reactions  . Scallops [Shellfish Allergy] Anaphylaxis  . Morphine And Related Itching  . Tylenol [Acetaminophen] Itching  . Latex Rash    FAMILY HISTORY:  Family History  Problem Relation Age of Onset  .  Other Mother   . Other Father     SOCIAL HISTORY:  reports that she quit smoking about 12 years ago. She has quit using smokeless tobacco. She reports that she drinks alcohol. She reports that she does not use illicit drugs.  REVIEW OF SYSTEMS:  A 12-system ROS was conducted and, unless otherwise specified in the HPI, was negative.    PHYSICAL EXAM  VITAL SIGNS: Temp:  [95.6 F (35.3 C)-97.5 F (36.4 C)] 95.6 F (35.3 C) (12/07 2200) Pulse Rate:  [106-131] 131 (12/07 2330) Resp:  [23-33] 33 (12/07 2330) BP: (123-144)/(68-104) 138/88 mmHg (12/07 2330) SpO2:  [88  %-100 %] 100 % (12/07 2330) Weight:  [125 lb (56.7 kg)] 125 lb (56.7 kg) (12/07 2354)  HEMODYNAMICS:    VENTILATOR SETTINGS:    INTAKE / OUTPUT: Intake/Output     12/07 0701 - 12/08 0700   I.V. (mL/kg) 1343.8 (23.7)   IV Piggyback 200   Total Intake(mL/kg) 1543.8 (27.2)   Urine (mL/kg/hr) 1950   Total Output 1950   Net -406.2         PHYSICAL EXAMINATION: General:  Young F in Mild Acute Distress Neuro:  Intact HEENT:  Sclera Anicteric, Conjunctiva Pink, MMM, OP Clear Neck: Trachea supple and midline, (-) LAN or JVD  Cardiovascular:  RRR, NS1/S2, (-) MRG Lungs:  CTAB Abdomen:  S/NT/ND/(+)BS Musculoskeletal:  (-) C/C/E Skin:  Intact  LABS:  CBC Recent Labs     01/28/13  1744  WBC  24.6*  HGB  13.4  HCT  41.6  PLT  371    Coag's No results found for this basename: APTT, INR,  in the last 72 hours  BMET Recent Labs     01/28/13  1744  01/28/13  2145  NA  132*  135  K  4.6  4.7  CL  91*  103  CO2  <7*  <7*  BUN  19  18  CREATININE  0.80  0.77  GLUCOSE  448*  305*    Electrolytes Recent Labs     01/28/13  1744  01/28/13  2145  CALCIUM  8.4  7.0*    Sepsis Markers No results found for this basename: LACTICACIDVEN, PROCALCITON, O2SATVEN,  in the last 72 hours  ABG Recent Labs     01/28/13  1655  01/28/13  2148  PHART  6.895*  6.842*  PCO2ART  8.6*  9.5*  PO2ART  144.0*  150.0*    Liver Enzymes Recent Labs     01/28/13  1744  AST  27  ALT  16  ALKPHOS  104  BILITOT  0.2*  ALBUMIN  4.3    Cardiac Enzymes No results found for this basename: TROPONINI, PROBNP,  in the last 72 hours  Glucose Recent Labs     01/28/13  1908  01/28/13  2001  01/28/13  2026  01/28/13  2135  01/28/13  2240  01/28/13  2339  GLUCAP  422*  390*  369*  305*  247*  216*    Imaging Dg Chest Portable 1 View  01/28/2013   CLINICAL DATA:  Hyperglycemia  EXAM: PORTABLE CHEST - 1 VIEW  COMPARISON:  01/24/2013  FINDINGS: Cardiomediastinal silhouette is  stable. No acute infiltrate or pleural effusion. No pulmonary edema. Bony thorax is unremarkable.  IMPRESSION: No active disease.   Electronically Signed   By: Natasha Mead M.D.   On: 01/28/2013 16:56    EKG: EKG from today was personally reviewed by me. It  is of poor quality, however, will take out acute ST changes. CXR: Chest x-ray from today was personally reviewed by me. It is essentially normal.  ASSESSMENT / PLAN: Principal Problem:   DKA (diabetic ketoacidoses) Active Problems:   Sepsis   Hypocalcemia   PULMONARY A/P: 1. No acute issue  CARDIOVASCULAR A/P:  1. Sepsis: This is a presumptive diagnosis given the patient's hypothermia earlier. There are no obvious causes on exam or by the currently available labs. Ms. Ryle has only received approximately 1.5 L of fluid thus far. Additional aggressive fluid resuscitation is indicated. Given her cough I we'll empirically cover her for pneumonia pending further diagnostic testing.  2 L normal saline bolus  Continue maintenance fluids following  Empiric vancomycin, cefepime, and Levaquin  Check sputum and urine cultures  Respiratory viral panel  Empiric Tamiflu  Check procalcitonin  Serial Troponins  Serial Lactates  RENAL A/P:  1. Hypocalcemia:   Replete  GASTROINTESTINAL A/P:  1. No acute issue  HEMATOLOGIC A/P:   1. No acute issue  INFECTIOUS A/P: 1. See above  ENDOCRINE A/P: 1. DKA: This is most likely secondary to an as yet unidentified infection. There is no evidence of acute cardiac event or history of noncompliance.  DKA protocol  NUROLOGIC A/P: 1. No acute issue   BEST PRACTICE / DISPOSITION Level of Care:  ICU Consultants:  None Code Status:  Full Diet:  NPO DVT Px:  SQH GI Px:  Not indicated Skin Integrity:  Intact Social / Family:  Not at bedside  TODAY'S SUMMARY:   I have personally obtained a history, examined the patient, evaluated laboratory and imaging results,  formulated the assessment and plan and placed orders.  CRITICAL CARE: The patient is critically ill with multiple organ systems failure and requires high complexity decision making for assessment and support, frequent evaluation and titration of therapies, application of advanced monitoring technologies and extensive interpretation of multiple databases. Critical Care Time devoted to patient care services described in this note is 30 minutes.   Evalyn Casco, MD Pulmonary and Critical Care Medicine [facility] Pager: [phone]  01/29/2013, 12:29 AM

## 2013-01-29 NOTE — Progress Notes (Signed)
Inpatient Diabetes Program Recommendations  AACE/ADA: New Consensus Statement on Inpatient Glycemic Control (2013)  Target Ranges:  Prepandial:   less than 140 mg/dL      Peak postprandial:   less than 180 mg/dL (1-2 hours)      Critically ill patients:  140 - 180 mg/dL   Reason for Visit: Spoke briefly to patient regarding DKA admission. She states she does not know what happened.  She has been seeing endocrinologist, Dr. [name] and actually has appointment tomorrow which will need to be rescheduled.  Patient remains on insulin drip and CO2 remains low.     She states at home she takes Lantus 22 units daily and Novolog sliding scale. She states her primary care MD took her off of the meal coverage?  Will need to follow up with endocrinologist after discharge.  Please check A1C.  Will follow. Thanks, Beryl Meager, RN, BC-ADM Inpatient Diabetes Coordinator Pager [phone]

## 2013-01-29 NOTE — Progress Notes (Signed)
eLink Physician-Brief Progress Note Patient Name: [name] DOB: Nov 04, 1980 MRN: [id number]  Date of Service  01/29/2013   HPI/Events of Note   Erroneous lab results reported earlier during computer down time Anion gap remains 20 Units Lantus given at 0530 UOP high BP OK  eICU Interventions  Send BMET now Send lactic acid now Send abg now Increase D5 1/2 NS rate to 150 cc/hr Target Glucose 250 until gap closes   Intervention Category Major Interventions: Acid-Base disturbance - evaluation and management  Pamala Hayman 01/29/2013, 6:02 AM

## 2013-01-29 NOTE — Progress Notes (Signed)
PULMONARY  / CRITICAL CARE MEDICINE  Name: [name] MRN: [id number] DOB: 19-Jan-1981    ADMISSION DATE:  01/28/2013 CONSULTATION DATE:  01/28/2013  REFERRING MD :  EDP PRIMARY SERVICE:  PCCM  CHIEF COMPLAINT:  DKA  BRIEF PATIENT DESCRIPTION: 32 yo with past medical history of DM admitted 12/7 with DKA in setting of presumed infection.  SIGNIFICANT EVENTS / STUDIES:   LINES / TUBES:  CULTURES: 12/07 Blood >>> 12/07 Urine >>> 12/07 Respiratory >>> 12/07 Respiratory viral panel >>>  ANTIBIOTICS: Cefepime 12/07 >>> Vancomycin 12/07 >>> Levaquin 12/07 >>> Tamiflu 12/07 >>>  INTERVAL HISTORY:  Reports abdominal pain is not well controlled.  Describes her diabetes as brittle with multiple DKA episodes.  VITAL SIGNS: Temp:  [95.6 F (35.3 C)-99.8 F (37.7 C)] 99.8 F (37.7 C) (12/08 0500) Pulse Rate:  [106-132] 109 (12/08 0800) Resp:  [18-33] 21 (12/08 0800) BP: (87-144)/(47-104) 93/60 mmHg (12/08 0800) SpO2:  [88 %-100 %] 100 % (12/08 0800) Weight:  [56.7 kg (125 lb)] 56.7 kg (125 lb) (12/07 2354) HEMODYNAMICS:   VENTILATOR SETTINGS:   INTAKE / OUTPUT: Intake/Output     12/07 0701 - 12/08 0700 12/08 0701 - 12/09 0700   I.V. (mL/kg) 1559.3 (27.5) 650 (11.5)   IV Piggyback 600    Total Intake(mL/kg) 2159.3 (38.1) 650 (11.5)   Urine (mL/kg/hr) 3000    Total Output 3000     Net -840.7 +650          PHYSICAL EXAMINATION: General:  Looks uncomfortable, ill Neuro:  Awake, alert, cooperative HEENT:  PERRL, dry membranes Cardiovascular:  RRR, no m/r/g Lungs: CTAB Abdomen:  Soft, nontender, bowel sounds diminished Musculoskeletal:  Moves all extremities, no edema Skin:  Intact  LABS: CBC  Recent Labs Lab 01/28/13 1744  WBC 24.6*  HGB 13.4  HCT 41.6  PLT 371   Coag's No results found for this basename: APTT, INR,  in the last 168 hours BMET  Recent Labs Lab 01/28/13 2347 01/29/13 0147 01/29/13 0351  NA 135 141 143  K 4.1 3.9 3.8  CL 102  106 109  CO2 <7* <7* <7*  BUN 16 15 13   CREATININE 0.83 0.83 0.78  GLUCOSE 211* 158* 116*   Electrolytes  Recent Labs Lab 01/28/13 2347 01/29/13 0147 01/29/13 0351  CALCIUM 7.3* 7.6* 7.3*   Sepsis Markers  Recent Labs Lab 01/28/13 1744 01/28/13 2347 01/29/13 0635  LATICACIDVEN 3.0* 1.9 0.9  PROCALCITON  --  0.98  --    ABG  Recent Labs Lab 01/28/13 2148 01/29/13 0014 01/29/13 0645  PHART 6.842* 7.012* 7.270*  PCO2ART 9.5* 10.6* 23.0*  PO2ART 150.0* 132.0* 103.0*   Liver Enzymes  Recent Labs Lab 01/28/13 1744  AST 27  ALT 16  ALKPHOS 104  BILITOT 0.2*  ALBUMIN 4.3   Cardiac Enzymes  Recent Labs Lab 01/28/13 2347 01/29/13 0635  TROPONINI <0.30 <0.30   Glucose  Recent Labs Lab 01/29/13 0200 01/29/13 0310 01/29/13 0414 01/29/13 0516 01/29/13 0641 01/29/13 0823  GLUCAP 155* 121* 129* 155* 148* 160*   CXR:  12/07 >>> nad  ASSESSMENT / PLAN:  PULMONARY A:  No active issues. P:   Gaol SpO2>92 Supplemental oxygen PRN  CARDIOVASCULAR A: Presumed sepsis, no overt source.  Lactate reassuring. H/o HTN. P:  Goal MAP>60 Hold Lisinopril, Clonidine as hypotensive on admission  RENAL A:  Hypovolemia.  Gap acidosis. P:   Trend BMP q4h, call results to MD D5 1/2 NS @ 150, increase if Insulin rate  decreases below 3 units / hour    GASTROINTESTINAL A:  No active issues. P:   NPO D/c Protonix as GI px is not indicated  HEMATOLOGIC A:  No acute issues. P:  Trend CBC Heparin for DVT Px  INFECTIOUS A:  Presumed infection, no clear source. Procalcitonin 0.98. P:   Cultures and antibiotics as above  ENDOCRINE  A:  Brittle DM1. DKA. P:   DKA protocol Do not decrease insulin below 3 units / hour, if necessary increase D5 rate Send serum ketones after gap closed Do not automatically transition off Insulin gtt  NEUROLOGIC A:  Abdominal pain. P:   Increase Dilaudid frequency to q3h PRN  BABCOCK,PETE,  01/29/2013, 9:25 AM  I  have personally obtained history, examined patient, evaluated and interpreted laboratory and imaging results, reviewed medical records, formulated assessment / plan and placed orders.  CRITICAL CARE:  The patient is critically ill with multiple organ systems failure and requires high complexity decision making for assessment and support, frequent evaluation and titration of therapies, application of advanced monitoring technologies and extensive interpretation of multiple databases. Critical Care Time devoted to patient care services described in this note is 45 minutes.   Lonia Farber, MD Pulmonary and Critical Care Medicine [facility] Pager: [phone]  01/29/2013, 10:26 AM

## 2013-01-29 NOTE — Progress Notes (Signed)
CARE MANAGEMENT NOTE 01/29/2013  Patient:  DELESIA, MARTINEK A   Account Number:  [id number]  Date Initiated:  01/29/2013  Documentation initiated by:  DAVIS,RHONDA  Subjective/Objective Assessment:   dka requiring iv insulin     Action/Plan:   home when stable/pt has made it known that she has problems getting her medication but does have medicaid and would only pay 3-4 dollars a drug item   Anticipated DC Date:  02/01/2013   Anticipated DC Plan:  HOME/SELF CARE  In-house referral  NA      DC Planning Services  CM consult  Medication Assistance      PAC Choice  NA   Choice offered to / List presented to:  NA   DME arranged  NA      DME agency  NA     HH arranged  NA      HH agency  NA   Status of service:  In process, will continue to follow Medicare Important Message given?  NA - LOS <3 / Initial given by admissions (If response is "NO", the following Medicare IM given date fields will be blank) Date Medicare IM given:   Date Additional Medicare IM given:    Discharge Disposition:    Per UR Regulation:  Reviewed for med. necessity/level of care/duration of stay  If discussed at Long Length of Stay Meetings, dates discussed:    Comments:  [id number]/Rhonda Stark Jock, BSN, [place] [phone] Chart Reviewed for discharge and hospital needs. Discharge needs at time of review:Patient has made it known that she has problem obtaining her med.  but does have medicaid coverage. Take novolog and lantus will see if any patient assistance programs can be used. Review of patient progress due on [id number].

## 2013-01-29 NOTE — Progress Notes (Signed)
Per the deptment of medicaid insulin is covered under the prescription area of coverage.

## 2013-01-30 ENCOUNTER — Ambulatory Visit: Payer: Medicaid Other | Admitting: Internal Medicine

## 2013-01-30 DIAGNOSIS — E1010 Type 1 diabetes mellitus with ketoacidosis without coma: Secondary | ICD-10-CM

## 2013-01-30 DIAGNOSIS — A419 Sepsis, unspecified organism: Principal | ICD-10-CM

## 2013-01-30 DIAGNOSIS — E1065 Type 1 diabetes mellitus with hyperglycemia: Secondary | ICD-10-CM

## 2013-01-30 DIAGNOSIS — E876 Hypokalemia: Secondary | ICD-10-CM

## 2013-01-30 DIAGNOSIS — E1110 Type 2 diabetes mellitus with ketoacidosis without coma: Secondary | ICD-10-CM

## 2013-01-30 DIAGNOSIS — E860 Dehydration: Secondary | ICD-10-CM

## 2013-01-30 DIAGNOSIS — D72829 Elevated white blood cell count, unspecified: Secondary | ICD-10-CM

## 2013-01-30 DIAGNOSIS — IMO0002 Reserved for concepts with insufficient information to code with codable children: Secondary | ICD-10-CM

## 2013-01-30 LAB — RESPIRATORY VIRUS PANEL
Adenovirus: NOT DETECTED
Influenza A H1: NOT DETECTED
Influenza A H3: NOT DETECTED
Metapneumovirus: NOT DETECTED
Parainfluenza 3: NOT DETECTED
Respiratory Syncytial Virus A: NOT DETECTED
Rhinovirus: NOT DETECTED

## 2013-01-30 LAB — BASIC METABOLIC PANEL
BUN: 7 mg/dL (ref 6–23)
BUN: 5 mg/dL — ABNORMAL LOW (ref 6–23)
CO2: 21 mEq/L (ref 19–32)
Calcium: 8.5 mg/dL (ref 8.4–10.5)
Calcium: 8.6 mg/dL (ref 8.4–10.5)
Chloride: 102 mEq/L (ref 96–112)
Creatinine, Ser: 0.65 mg/dL (ref 0.50–1.10)
Creatinine, Ser: 0.64 mg/dL (ref 0.50–1.10)
Creatinine, Ser: 0.61 mg/dL (ref 0.50–1.10)
GFR calc Af Amer: >90 mL/min (ref >90)
GFR calc Af Amer: >90 mL/min (ref >90)
GFR calc Af Amer: >90 mL/min (ref >90)
GFR calc non Af Amer: >90 mL/min (ref >90)
Glucose, Bld: 94 mg/dL (ref 70–99)
Glucose, Bld: 113 mg/dL — ABNORMAL HIGH (ref 70–99)
Glucose, Bld: 221 mg/dL — ABNORMAL HIGH (ref 70–99)
Potassium: 2.9 mEq/L — ABNORMAL LOW (ref 3.5–5.1)
Potassium: 2.8 mEq/L — ABNORMAL LOW (ref 3.5–5.1)
Potassium: 3.5 mEq/L (ref 3.5–5.1)
Sodium: 137 mEq/L (ref 135–145)
Sodium: 132 mEq/L — ABNORMAL LOW (ref 135–145)

## 2013-01-30 LAB — GLUCOSE, CAPILLARY
Glucose-Capillary: 106 mg/dL — ABNORMAL HIGH (ref 70–99)
Glucose-Capillary: 101 mg/dL — ABNORMAL HIGH (ref 70–99)
Glucose-Capillary: 223 mg/dL — ABNORMAL HIGH (ref 70–99)
Glucose-Capillary: 378 mg/dL — ABNORMAL HIGH (ref 70–99)

## 2013-01-30 LAB — CBC
HCT: 34 % — ABNORMAL LOW (ref 36.0–46.0)
Hemoglobin: 12.4 g/dL (ref 12.0–15.0)
MCH: 32.4 pg (ref 26.0–34.0)
MCHC: 36.5 g/dL — ABNORMAL HIGH (ref 30.0–36.0)
MCV: 88.8 fL (ref 78.0–100.0)
RBC: 3.83 MIL/uL — ABNORMAL LOW (ref 3.87–5.11)

## 2013-01-30 LAB — URINE CULTURE

## 2013-01-30 LAB — PROCALCITONIN: Procalcitonin: 2.42 ng/mL

## 2013-01-30 MED ORDER — ONDANSETRON HCL 4 MG/2ML IJ SOLN
4.0000 mg | Freq: Four times a day (QID) | INTRAMUSCULAR | Status: DC | PRN
  Administered 2013-01-30 – 2013-02-01 (×6): 4 mg via INTRAVENOUS
  Filled 2013-01-30 (×6): qty 2

## 2013-01-30 MED ORDER — POTASSIUM CHLORIDE CRYS ER 20 MEQ PO TBCR
40.0000 meq | EXTENDED_RELEASE_TABLET | ORAL | Status: AC
  Administered 2013-01-30 (×2): 40 meq via ORAL
  Filled 2013-01-30 (×2): qty 2

## 2013-01-30 MED ORDER — INSULIN ASPART 100 UNIT/ML ~~LOC~~ SOLN
0.0000 [IU] | Freq: Three times a day (TID) | SUBCUTANEOUS | Status: DC
  Administered 2013-01-30 (×2): 5 [IU] via SUBCUTANEOUS
  Administered 2013-01-31: 8 [IU] via SUBCUTANEOUS

## 2013-01-30 MED ORDER — INSULIN ASPART 100 UNIT/ML ~~LOC~~ SOLN
0.0000 [IU] | Freq: Every day | SUBCUTANEOUS | Status: DC
  Administered 2013-01-30: 5 [IU] via SUBCUTANEOUS

## 2013-01-30 MED ORDER — INSULIN GLARGINE 100 UNIT/ML ~~LOC~~ SOLN
15.0000 [IU] | Freq: Every day | SUBCUTANEOUS | Status: DC
  Administered 2013-01-30: 15 [IU] via SUBCUTANEOUS
  Filled 2013-01-30: qty 0.15

## 2013-01-30 NOTE — Progress Notes (Signed)
eLink Physician-Brief Progress Note Patient Name: [name] DOB: Apr 28, 1980 MRN: [id number]  Date of Service  01/30/2013   HPI/Events of Note   hypokalemia  eICU Interventions  Potassium replaced   Intervention Category Intermediate Interventions: Electrolyte abnormality - evaluation and management  Francia Verry 01/30/2013, 5:17 AM

## 2013-01-30 NOTE — Progress Notes (Signed)
PULMONARY  / CRITICAL CARE MEDICINE  Name: [name] MRN: [id number] DOB: [birth year]    ADMISSION DATE:  01/28/2013 CONSULTATION DATE:  01/28/2013  REFERRING MD :  EDP PRIMARY SERVICE:  PCCM  CHIEF COMPLAINT:  DKA  BRIEF PATIENT DESCRIPTION: 32 yo with past medical history of DM admitted 12/7 with DKA in setting of presumed infection.  SIGNIFICANT EVENTS / STUDIES:   LINES / TUBES:  CULTURES: 12/07 MRSA PCR >>> neg 12/07 Blood >>> 12/07 Urine >>> multiple morphotypes   12/07 Respiratory viral panel >>>  ANTIBIOTICS: Cefepime 12/07 >>> 12/09 Vancomycin 12/07 >>> 12/09 Levaquin 12/07 >>> Tamiflu 12/07 >>>  INTERVAL HISTORY:  Feeling better.  Hungry.  VITAL SIGNS: Temp:  [98.2 F (36.8 C)-98.9 F (37.2 C)] 98.2 F (36.8 C) (12/09 0800) Pulse Rate:  [70-108] 94 (12/09 0800) Resp:  [11-26] 19 (12/09 0800) BP: (71-106)/(47-69) 103/67 mmHg (12/09 0800) SpO2:  [95 %-100 %] 100 % (12/09 0800) HEMODYNAMICS:   VENTILATOR SETTINGS:   INTAKE / OUTPUT: Intake/Output     12/08 0701 - 12/09 0700 12/09 0701 - 12/10 0700   I.V. (mL/kg) 3375.7 (59.5) 100 (1.8)   Other 100    IV Piggyback 710 150   Total Intake(mL/kg) 4185.7 (73.8) 250 (4.4)   Urine (mL/kg/hr) 3950 (2.9)    Total Output 3950     Net +235.7 +250          PHYSICAL EXAMINATION: General:  Comfortable, no distress Neuro:  Awake, alert, cooperative HEENT:  PERRL, moist membranes Cardiovascular:  RRR, no m/r/g Lungs: CTAB Abdomen:  Soft, nontender, bowel sounds diminished Musculoskeletal:  Moves all extremities, no edema Skin:  Intact  LABS: CBC  Recent Labs Lab 01/28/13 1744  WBC 24.6*  HGB 13.4  HCT 41.6  PLT 371   Coag's No results found for this basename: APTT, INR,  in the last 168 hours BMET  Recent Labs Lab 01/29/13 2152 01/30/13 0345 01/30/13 0610  NA 136 137 136  K 3.0* 2.9* 2.8*  CL 108 102 104  CO2 15* 14* 20  BUN 10 8 7   CREATININE 0.68 0.65 0.64  GLUCOSE 150* 94  113*   Electrolytes  Recent Labs Lab 01/29/13 2152 01/30/13 0345 01/30/13 0610  CALCIUM 7.5* 8.5 7.7*   Sepsis Markers  Recent Labs Lab 01/28/13 2347 01/29/13 0635 01/29/13 1045 01/30/13 0246  LATICACIDVEN 1.9 0.9 1.0  --   PROCALCITON 0.98  --   --  2.42   ABG  Recent Labs Lab 01/28/13 2148 01/29/13 0014 01/29/13 0645  PHART 6.842* 7.012* 7.270*  PCO2ART 9.5* 10.6* 23.0*  PO2ART 150.0* 132.0* 103.0*   Liver Enzymes  Recent Labs Lab 01/28/13 1744  AST 27  ALT 16  ALKPHOS 104  BILITOT 0.2*  ALBUMIN 4.3   Cardiac Enzymes  Recent Labs Lab 01/28/13 2347 01/29/13 0635 01/29/13 1045  TROPONINI <0.30 <0.30 <0.30   Glucose  Recent Labs Lab 01/30/13 0009 01/30/13 0127 01/30/13 0236 01/30/13 0615 01/30/13 0718 01/30/13 0823  GLUCAP 106* 79 98 100* 101* 113*   CXR:  None today  ASSESSMENT / PLAN:  PULMONARY A:  No active issues. P:   Gaol SpO2>92 Supplemental oxygen PRN  CARDIOVASCULAR A: Presumed sepsis, no overt source - resolved.  Lactate reassuring. H/o HTN. P:  Goal MAP>60 Hold Lisinopril, Clonidine as hypotensive on admission  RENAL A:  Hypovolemia.  Gap acidosis - resolved. Hypokalemia. P:   D/c BMET q4h, next in 5 PM D/c IVF K 40 x 2  GASTROINTESTINAL A:  No active issues. P:   Start carb modified diet   HEMATOLOGIC A:  No acute issues. P:  Trend CBC Heparin for DVT Px  INFECTIOUS A:  Presumed infection, no clear source. Procalcitonin 0.98. P:   Cultures and antibiotics as above CBC now D/c Vancomycin / Cefepime  ENDOCRINE  A:  Brittle DM1. DKA. P:   Lantus 15 (home dose 20) SSI  NEUROLOGIC A:  Abdominal pain. P:   Dilaudid frequency to q3h PRN  I have personally obtained history, examined patient, evaluated and interpreted laboratory and imaging results, reviewed medical records, formulated assessment / plan and placed orders.  Lonia Farber, MD Pulmonary and Critical Care  Medicine [facility] Pager: [phone]  01/30/2013, 9:06 AM

## 2013-01-30 NOTE — Progress Notes (Signed)
Inpatient Diabetes Program Recommendations  AACE/ADA: New Consensus Statement on Inpatient Glycemic Control (2013)  Target Ranges:  Prepandial:   less than 140 mg/dL      Peak postprandial:   less than 180 mg/dL (1-2 hours)      Critically ill patients:  140 - 180 mg/dL   Reason for Visit: Briefly spoke to patient.  She states she is still having abdominal pain.  Consider reducing Novolog correction to sensitive and add Novolog meal coverage 3 units tid with meals (Hold if patient eats less than 50%).   Thanks,   Beryl Meager, RN, BC-ADM Inpatient Diabetes Coordinator Pager [phone]

## 2013-01-30 NOTE — Progress Notes (Signed)
Pt CBG at 0130 was 79.  Insulin drip turned off and elink made aware and lab results given.   Orders received to keep insulin drip off until CBG 125.  Follow up CBG 98.  Continue to monitor.

## 2013-01-31 DIAGNOSIS — D649 Anemia, unspecified: Secondary | ICD-10-CM

## 2013-01-31 LAB — CBC
HCT: 32 % — ABNORMAL LOW (ref 36.0–46.0)
MCH: 31.5 pg (ref 26.0–34.0)
MCV: 91.7 fL (ref 78.0–100.0)
Platelets: 131 10*3/uL — ABNORMAL LOW (ref 150–400)
RDW: 14.8 % (ref 11.5–15.5)
WBC: 3.5 10*3/uL — ABNORMAL LOW (ref 4.0–10.5)

## 2013-01-31 LAB — BASIC METABOLIC PANEL
BUN: 9 mg/dL (ref 6–23)
Calcium: 8.2 mg/dL — ABNORMAL LOW (ref 8.4–10.5)
Chloride: 103 mEq/L (ref 96–112)
Creatinine, Ser: 0.6 mg/dL (ref 0.50–1.10)
GFR calc Af Amer: >90 mL/min (ref >90)

## 2013-01-31 LAB — GLUCOSE, CAPILLARY
Glucose-Capillary: 131 mg/dL — ABNORMAL HIGH (ref 70–99)
Glucose-Capillary: 124 mg/dL — ABNORMAL HIGH (ref 70–99)
Glucose-Capillary: 270 mg/dL — ABNORMAL HIGH (ref 70–99)
Glucose-Capillary: 259 mg/dL — ABNORMAL HIGH (ref 70–99)

## 2013-01-31 MED ORDER — LISINOPRIL 2.5 MG PO TABS
2.5000 mg | ORAL_TABLET | Freq: Every day | ORAL | Status: DC
  Administered 2013-01-31 – 2013-02-01 (×2): 2.5 mg via ORAL
  Filled 2013-01-31 (×2): qty 1

## 2013-01-31 MED ORDER — INSULIN GLARGINE 100 UNIT/ML ~~LOC~~ SOLN
20.0000 [IU] | Freq: Every day | SUBCUTANEOUS | Status: DC
  Administered 2013-01-31 – 2013-02-01 (×2): 20 [IU] via SUBCUTANEOUS
  Filled 2013-01-31 (×2): qty 0.2

## 2013-01-31 MED ORDER — INSULIN ASPART 100 UNIT/ML ~~LOC~~ SOLN
3.0000 [IU] | Freq: Three times a day (TID) | SUBCUTANEOUS | Status: DC
  Administered 2013-01-31 – 2013-02-01 (×5): 3 [IU] via SUBCUTANEOUS

## 2013-01-31 MED ORDER — INSULIN GLARGINE 100 UNIT/ML ~~LOC~~ SOLN
15.0000 [IU] | Freq: Every day | SUBCUTANEOUS | Status: DC
  Filled 2013-01-31: qty 0.15

## 2013-01-31 MED ORDER — INSULIN ASPART 100 UNIT/ML ~~LOC~~ SOLN
0.0000 [IU] | Freq: Every day | SUBCUTANEOUS | Status: DC
  Administered 2013-01-31: 4 [IU] via SUBCUTANEOUS

## 2013-01-31 MED ORDER — POTASSIUM CHLORIDE CRYS ER 10 MEQ PO TBCR
30.0000 meq | EXTENDED_RELEASE_TABLET | ORAL | Status: AC
  Administered 2013-01-31 (×2): 30 meq via ORAL
  Filled 2013-01-31 (×2): qty 1

## 2013-01-31 MED ORDER — INSULIN ASPART 100 UNIT/ML ~~LOC~~ SOLN
0.0000 [IU] | Freq: Three times a day (TID) | SUBCUTANEOUS | Status: DC
  Administered 2013-01-31: 7 [IU] via SUBCUTANEOUS
  Administered 2013-01-31: 5 [IU] via SUBCUTANEOUS
  Administered 2013-02-01: 9 [IU] via SUBCUTANEOUS

## 2013-01-31 NOTE — Progress Notes (Signed)
Inpatient Diabetes Program Recommendations  AACE/ADA: New Consensus Statement on Inpatient Glycemic Control (2013)  Target Ranges:  Prepandial:   less than 140 mg/dL      Peak postprandial:   less than 180 mg/dL (1-2 hours)      Critically ill patients:  140 - 180 mg/dL   Reason for Visit: Patient with Type 1 diabetes/DKA.   Results for [name], [name] (MRN [id number]) as of 01/31/2013 14:02  Ref. Range 01/30/2013 12:12 01/30/2013 16:45 01/30/2013 21:56 01/31/2013 07:47 01/31/2013 12:03  Glucose-Capillary Latest Range: 70-99 mg/dL 846 (H) 962 (H) 952 (H) 270 (H) 308 (H)   Please consider increasing Lantus to 20 units daily (Home dose is Lantus 22 units).  Agree with the addition of meal coverage.    Called and discussed with NP, Anders Simmonds and orders placed.  Will follow. Beryl Meager, RN, BC-ADM Inpatient Diabetes Coordinator Pager [phone]

## 2013-01-31 NOTE — Progress Notes (Signed)
PULMONARY  / CRITICAL CARE MEDICINE  Name: [name] MRN: [id number] DOB: 1980-09-10    ADMISSION DATE:  01/28/2013 CONSULTATION DATE:  01/28/2013  REFERRING MD :  EDP PRIMARY SERVICE:  PCCM  CHIEF COMPLAINT:  DKA  BRIEF PATIENT DESCRIPTION: 32 yo with past medical history of DM admitted 12/7 with DKA in setting of presumed infection.  SIGNIFICANT EVENTS / STUDIES:   LINES / TUBES:  CULTURES: 12/07 MRSA PCR >>> neg 12/07 Blood >>> 12/07 Urine >>> multiple morphotypes   12/07 Respiratory viral panel >>> neg  ANTIBIOTICS: Cefepime 12/07 >>> 12/09 Vancomycin 12/07 >>> 12/09 Levaquin 12/07 >>> 12/10 Tamiflu 12/07 >>> 12/10  INTERVAL HISTORY:  Still abdominal pain, but improves.  VITAL SIGNS: Temp:  [97.4 F (36.3 C)-98.4 F (36.9 C)] 97.9 F (36.6 C) (12/10 0800) Pulse Rate:  [74-102] 92 (12/10 0900) Resp:  [12-21] 13 (12/10 0900) BP: (95-126)/(54-88) 122/85 mmHg (12/10 0900) SpO2:  [90 %-100 %] 100 % (12/10 0900) Weight:  [64.638 kg (142 lb 8 oz)] 64.638 kg (142 lb 8 oz) (12/10 0400) HEMODYNAMICS:   VENTILATOR SETTINGS:   INTAKE / OUTPUT: Intake/Output     12/09 0701 - 12/10 0700 12/10 0701 - 12/11 0700   P.O. 990 240   I.V. (mL/kg) 200 (3.1)    Other     IV Piggyback 300    Total Intake(mL/kg) 1490 (23.1) 240 (3.7)   Urine (mL/kg/hr) 2000 (1.3)    Total Output 2000     Net -510 +240        Urine Occurrence 2 x    Stool Occurrence 1 x      PHYSICAL EXAMINATION: General:  Resting comfortbaly Neuro:  Awake, alert, non focal HEENT:  PERRL, moist membranes Cardiovascular:  RRR, no m/r/g Lungs: CTAB Abdomen:  Soft, nontender, bowel sounds diminished Musculoskeletal:  Moves all extremities, no edema Skin:  Intact  LABS: CBC  Recent Labs Lab 01/28/13 1744 01/30/13 1044 01/31/13 0325  WBC 24.6* 6.7 3.5*  HGB 13.4 12.4 11.0*  HCT 41.6 34.0* 32.0*  PLT 371 160 131*   Coag's No results found for this basename: APTT, INR,  in the last 168  hours BMET  Recent Labs Lab 01/30/13 0610 01/30/13 1651 01/31/13 0325  NA 136 132* 136  K 2.8* 3.5 3.2*  CL 104 97 103  CO2 20 21 25   BUN 7 5* 9  CREATININE 0.64 0.61 0.60  GLUCOSE 113* 221* 251*   Electrolytes  Recent Labs Lab 01/30/13 0610 01/30/13 1651 01/31/13 0325  CALCIUM 7.7* 8.6 8.2*   Sepsis Markers  Recent Labs Lab 01/28/13 2347 01/29/13 0635 01/29/13 1045 01/30/13 0246 01/31/13 0325  LATICACIDVEN 1.9 0.9 1.0  --   --   PROCALCITON 0.98  --   --  2.42 0.79   ABG  Recent Labs Lab 01/28/13 2148 01/29/13 0014 01/29/13 0645  PHART 6.842* 7.012* 7.270*  PCO2ART 9.5* 10.6* 23.0*  PO2ART 150.0* 132.0* 103.0*   Liver Enzymes  Recent Labs Lab 01/28/13 1744  AST 27  ALT 16  ALKPHOS 104  BILITOT 0.2*  ALBUMIN 4.3   Cardiac Enzymes  Recent Labs Lab 01/28/13 2347 01/29/13 0635 01/29/13 1045  TROPONINI <0.30 <0.30 <0.30   Glucose  Recent Labs Lab 01/30/13 0718 01/30/13 0823 01/30/13 1212 01/30/13 1645 01/30/13 2156 01/31/13 0747  GLUCAP 101* 113* 223* 202* 378* 270*   CXR:  None today  ASSESSMENT / PLAN:  PULMONARY A:  No active issues. P:   Gaol SpO2>92 Supplemental  oxygen PRN  CARDIOVASCULAR A: Presumed sepsis, no overt source - resolved.  Lactate reassuring. H/o HTN. P:  Goal MAP>60 Restart  Lisinopril Hold Clonidine  RENAL A:  Hypokalemia. P:   Trend BMP K 30 x 2 by eLink  GASTROINTESTINAL A:  No active issues. P:   Carb modified diet  HEMATOLOGIC A:  Mild anemia. Thrombocytopenia. P:  Trend CBC D/c Heparin, ambulate  INFECTIOUS A:  No evidence of infection. WBC normalized. Procalcitonin 0.98. Viral panel negative. P:   D/c abx Trend CBC  ENDOCRINE  A:  Brittle DM1. DKA. P:   Diabetes coordinator's input appreciated Start 3 units meal coverage Change SSI to sensitive Lantus 15 (home dose 20)  NEUROLOGIC A:  Abdominal pain, improving. P:   Dilaudid frequency to q3h PRN  Transfer to  medical floor under PCCM.  Likely to be discharged tomorrow.  I have personally obtained history, examined patient, evaluated and interpreted laboratory and imaging results, reviewed medical records, formulated assessment / plan and placed orders.  Lonia Farber, MD Pulmonary and Critical Care Medicine [facility] Pager: [phone]  01/31/2013, 9:45 AM

## 2013-01-31 NOTE — Progress Notes (Signed)
[facility] ADULT ICU REPLACEMENT PROTOCOL FOR AM LAB REPLACEMENT ONLY  The patient does apply for the [facility] Adult ICU Electrolyte Replacment Protocol based on the criteria listed below:   1. Is GFR >/= 40 ml/min? yes  Patient's GFR today is >90 2. Is urine output >/= 0.5 ml/kg/hr for the last 6 hours? yes Patient's UOP is 2.2 ml/kg/hr 3. Is BUN < 60 mg/dL? yes  Patient's BUN today is 9 4. Abnormal electrolyte(s): K 3.2 5. Ordered repletion with: per protocol 6. If a panic level lab has been reported, has the CCM MD in charge been notified? no.   Physician:    Markus Daft A 01/31/2013 4:37 AM

## 2013-01-31 NOTE — ED Provider Notes (Signed)
  This was a shared visit with a mid-level provided (NP or PA).  Throughout the patient's course I was available for consultation/collaboration.  I saw the relevant labs and studies - I agree with the interpretation.  On my exam the patient was very uncomfortable appearing, though she was awake and speaking clearly. Prior to my evaluation the patient reviewed his chart, including multiple prior evaluations for similar presentations.  Patient has a notable history of diabetes, documented medication noncompliance. Patient's initial evaluation he was notable for hyperglycemia, with significant anion gap, acidosis. Patient received empiric fluids, was started on insulin drip after the initial resuscitation, and return of initial labs. On repeat evaluation the patient continues to be speaking clearly, though she continues to have visible discomfort. Patient was critically ill, with pH less than 6.9, requiring ICU admission for further evaluation and management.  CRITICAL CARE Performed by: Gerhard Munch Total critical care time: 40 Critical care time was exclusive of separately billable procedures and treating other patients. Critical care was necessary to treat or prevent imminent or life-threatening deterioration. Critical care was time spent personally by me on the following activities: development of treatment plan with patient and/or surrogate as well as nursing, discussions with consultants, evaluation of patient's response to treatment, examination of patient, obtaining history from patient or surrogate, ordering and performing treatments and interventions, ordering and review of laboratory studies, ordering and review of radiographic studies, pulse oximetry and re-evaluation of patient's condition.       Gerhard Munch, MD 01/31/13 [phone]

## 2013-01-31 NOTE — Progress Notes (Signed)
Spoke briefly to patient.  She states that she is feeling much better.  She was concerned about missing endocrinology appointment yesterday.  I called and left message for Dr. Lavella Hammock MA that patient was in the hospital for DKA. No further needs at this time. Beryl Meager, RN, BC-ADM Inpatient Diabetes Coordinator Pager [phone]

## 2013-01-31 NOTE — Discharge Summary (Signed)
Physician Discharge Summary        Patient ID: AIREONNA BAUER MRN: [id number] DOB/AGE: 08-06-1980 32 y.o.  Admit date: 01/28/2013 Discharge date: 02/01/2013  Discharge Diagnoses:  Principal Problem:   DKA (diabetic ketoacidoses) Active Problems:   Hypocalcemia   Sepsis  Detailed Hospital Course:   32 year old female with type 1 diabetes mellitus who presents for "DKA". She was in her usual state of health until  12/7 when she began to c/o nausea, severe abdominal pain, and vomiting. In addition, she developed subsequent chills and shortness of breath.  Her daughter had been sick recently. She was admitted to the intensive care. She was treated in the usual fashion and this included: IV hydration, Initially IV insulin until anion gap was closed. Her blood glucose stabilized. She was placed on basal and sliding scale insulin. Her antibiotics were stopped on 12/10 and she was transferred to the med/surg floor. She was deemed stable for d/c as of 12/11 and will be discharged to home with the regimen as outlined below.    Discharge Plan by diagnoses   Brittle DM1. DKA.  P:  Home on lantus 25 units daily Resume home SSI with resistant scale F/u 1 week at her PCP.   HTN P: Resume home meds  Mild anemia. Thrombocytopenia.  P:  Follow up CBC with PCP PRN   Significant Hospital tests/ studies/ interventions and procedures  CULTURES:  12/07 MRSA PCR >>> neg  12/07 Blood >>>  12/07 Urine >>> multiple morphotypes  12/07 Respiratory viral panel >>> neg   ANTIBIOTICS:  Cefepime 12/07 >>> 12/09  Vancomycin 12/07 >>> 12/09  Levaquin 12/07 >>> 12/10  Tamiflu 12/07 >>> 12/10   Discharge Exam: BP 116/75  Pulse 91  Temp(Src) 98.7 F (37.1 C) (Oral)  Resp 16  Ht 5' 4.5" (1.638 m)  Wt 143 lb 15.4 oz (65.3 kg)  BMI 24.34 kg/m2  SpO2 100%  LMP 01/28/2013  General: no distress  Neuro: Awake, alert, non focal  HEENT: PERRL, moist membranes  Cardiovascular: RRR, no m/r/g   Lungs: CTAB  Abdomen: Soft, nontender, bowel sounds diminished  Musculoskeletal: Moves all extremities, no edema  Skin: Intact   Labs at discharge Lab Results  Component Value Date   CREATININE 0.63 02/01/2013   BUN 12 02/01/2013   NA 134* 02/01/2013   K 4.5 02/01/2013   CL 99 02/01/2013   CO2 27 02/01/2013   Lab Results  Component Value Date   WBC 3.0* 02/01/2013   HGB 11.6* 02/01/2013   HCT 34.0* 02/01/2013   MCV 93.7 02/01/2013   PLT 138* 02/01/2013   Lab Results  Component Value Date   ALT 16 01/28/2013   AST 27 01/28/2013   ALKPHOS 104 01/28/2013   BILITOT 0.2* 01/28/2013   Lab Results  Component Value Date   INR 1.02 02/04/2012   INR 1.29 04/20/2011   INR 1.0 02/07/2007   Disposition:  01-Home or Self Care      Discharge Orders   Future Orders Complete By Expires   Call MD for:  difficulty breathing, headache or visual disturbances  As directed    Call MD for:  persistant nausea and vomiting  As directed    Call MD for:  temperature >100.4  As directed    Call MD for:  As directed    Comments:     Rising trend of blood sugars.   Diet Carb Modified  As directed    Discharge instructions  As directed  Comments:     Record your home blood sugars and take them with you to your follow up appointment with your primary doctor.   Increase activity slowly  As directed        Medication List         cloNIDine 0.1 MG tablet  Commonly known as:  CATAPRES  Take 0.1 mg by mouth 2 (two) times daily.     gabapentin 100 MG capsule  Commonly known as:  NEURONTIN  Take 200 mg by mouth 3 (three) times daily.     GLUCAGON EMERGENCY 1 MG injection  Generic drug:  glucagon  Inject 1 mg into the muscle once as needed (severe hypoglycemia).     HYDROcodone-acetaminophen 5-325 MG per tablet  Commonly known as:  NORCO/VICODIN  Take 1 tablet by mouth every 6 (six) hours as needed for moderate pain.     insulin aspart 100 UNIT/ML injection  Commonly known as:   novoLOG  - Inject 1-3 Units into the skin 3 (three) times daily with meals. CBG < 70: implement hypoglycemia protocol  - CBG > 400: call MD   -   - CBG 70 - 120: 0 units  - CBG 121 - 150: 3 units  - CBG 151 - 200: 4 units  - CBG 201 - 250: 7 units  - CBG 251 - 300: 11 units  - CBG 301 - 350: 15 units  - CBG 351 - 400: 20 units  - CBG > 400: call MD     insulin glargine 100 UNIT/ML injection  Commonly known as:  LANTUS  Inject 0.25 mLs (25 Units total) into the skin every morning.     lisinopril 2.5 MG tablet  Commonly known as:  PRINIVIL,ZESTRIL  Take 2.5 mg by mouth every evening.     lovastatin 20 MG tablet  Commonly known as:  MEVACOR  Take 20 mg by mouth at bedtime.     multivitamin with minerals Tabs tablet  Take 1 tablet by mouth every morning. Diabetic support vitamin     niacin 500 MG tablet  Take 1 tablet (500 mg total) by mouth at bedtime.     ondansetron 8 MG disintegrating tablet  Commonly known as:  ZOFRAN-ODT  Take 8 mg by mouth every 8 (eight) hours as needed for nausea or vomiting.       Follow-up Information   Follow up with [facility], MD. (Go to be seen within one week post discharge. )    Specialty:  Internal Medicine   Contact information:   [address] [place] [address] [phone]       Discharged Condition: good.    Physician Statement:   The Patient was personally examined, the discharge assessment and plan has been personally reviewed and I agree with the assessment and plan. > 35 minutes of time have been dedicated to discharge assessment, planning and discharge instructions.   Canary Brim, NP-C [facility] Pgr: [phone] or [phone]    Independently examined pt, evaluated data & formulated above discharge  care plan with NP who scribed this note & edited by me.  Rickeya Manus V.

## 2013-02-01 LAB — BASIC METABOLIC PANEL
CO2: 27 mEq/L (ref 19–32)
Calcium: 9.7 mg/dL (ref 8.4–10.5)
Chloride: 99 mEq/L (ref 96–112)
Creatinine, Ser: 0.63 mg/dL (ref 0.50–1.10)
GFR calc Af Amer: >90 mL/min (ref >90)
GFR calc non Af Amer: >90 mL/min (ref >90)
Sodium: 134 mEq/L — ABNORMAL LOW (ref 135–145)

## 2013-02-01 LAB — GLUCOSE, CAPILLARY
Glucose-Capillary: 393 mg/dL — ABNORMAL HIGH (ref 70–99)
Glucose-Capillary: 342 mg/dL — ABNORMAL HIGH (ref 70–99)

## 2013-02-01 LAB — CBC
MCH: 32 pg (ref 26.0–34.0)
MCHC: 34.1 g/dL (ref 30.0–36.0)
MCV: 93.7 fL (ref 78.0–100.0)
Platelets: 138 10*3/uL — ABNORMAL LOW (ref 150–400)
RBC: 3.63 MIL/uL — ABNORMAL LOW (ref 3.87–5.11)
RDW: 14.7 % (ref 11.5–15.5)

## 2013-02-01 MED ORDER — INSULIN ASPART 100 UNIT/ML ~~LOC~~ SOLN: 1.0000 [IU] | Freq: Three times a day (TID) | SUBCUTANEOUS | Status: DC

## 2013-02-01 MED ORDER — INSULIN GLARGINE 100 UNIT/ML ~~LOC~~ SOLN
25.0000 [IU] | Freq: Every day | SUBCUTANEOUS | Status: DC
  Filled 2013-02-01: qty 0.25

## 2013-02-01 MED ORDER — INSULIN GLARGINE 100 UNIT/ML ~~LOC~~ SOLN
5.0000 [IU] | Freq: Once | SUBCUTANEOUS | Status: AC
  Administered 2013-02-01: 5 [IU] via SUBCUTANEOUS
  Filled 2013-02-01: qty 0.05

## 2013-02-01 MED ORDER — ALUM & MAG HYDROXIDE-SIMETH 200-200-20 MG/5ML PO SUSP
30.0000 mL | Freq: Four times a day (QID) | ORAL | Status: DC | PRN
  Administered 2013-02-01: 30 mL via ORAL
  Filled 2013-02-01: qty 30

## 2013-02-01 MED ORDER — INSULIN ASPART 100 UNIT/ML ~~LOC~~ SOLN
0.0000 [IU] | SUBCUTANEOUS | Status: DC
  Administered 2013-02-01: 15 [IU] via SUBCUTANEOUS
  Administered 2013-02-01: 11 [IU] via SUBCUTANEOUS

## 2013-02-01 MED ORDER — INSULIN GLARGINE 100 UNIT/ML ~~LOC~~ SOLN: 25.0000 [IU] | Freq: Every morning | SUBCUTANEOUS | Status: DC

## 2013-02-01 MED ORDER — INSULIN GLARGINE 100 UNIT/ML ~~LOC~~ SOLN: 20.0000 [IU] | Freq: Every morning | SUBCUTANEOUS | Status: DC

## 2013-02-01 NOTE — Progress Notes (Signed)
CBGs remain high - 400 range C/o mild abdominal pain Increase lantus to 25 U (home =22) & change to resistant scale. No evidence of infection or precipitating factor  Soul Hackman V.  [phone]

## 2013-02-01 NOTE — Progress Notes (Signed)
Pt. Was discharged home. She was given her discharge instructions, prescriptions, and all questions were answered. She was transported home by her family.

## 2013-02-01 NOTE — Progress Notes (Signed)
PULMONARY  / CRITICAL CARE MEDICINE  Name: [name] MRN: [id number] DOB: 10-02-1980    ADMISSION DATE:  01/28/2013 CONSULTATION DATE:  01/28/2013  REFERRING MD :  EDP PRIMARY SERVICE:  PCCM  CHIEF COMPLAINT:  DKA  BRIEF PATIENT DESCRIPTION: 32 yo with past medical history of DM admitted 12/7 with DKA in setting of presumed infection.  SIGNIFICANT EVENTS / STUDIES:   LINES / TUBES:  CULTURES: 12/07 MRSA PCR >>> neg 12/07 Blood >>>ng 12/07 Urine >>> multiple morphotypes   12/07 Respiratory viral panel >>> neg  ANTIBIOTICS: Cefepime 12/07 >>> 12/09 Vancomycin 12/07 >>> 12/09 Levaquin 12/07 >>> 12/10 Tamiflu 12/07 >>> 12/10  INTERVAL HISTORY:  Still abdominal pain, afebirle CBG high  VITAL SIGNS: Temp:  [98.1 F (36.7 C)-98.7 F (37.1 C)] 98.7 F (37.1 C) (12/11 1500) Pulse Rate:  [77-91] 91 (12/11 1500) Resp:  [16-18] 16 (12/11 1500) BP: (110-122)/(75-87) 116/75 mmHg (12/11 1500) SpO2:  [98 %-100 %] 100 % (12/11 1500) HEMODYNAMICS:   VENTILATOR SETTINGS:   INTAKE / OUTPUT: Intake/Output     12/10 0701 - 12/11 0700 12/11 0701 - 12/12 0700   P.O. 720 600   I.V. (mL/kg)     IV Piggyback     Total Intake(mL/kg) 720 (11) 600 (9.2)   Urine (mL/kg/hr) 550 (0.4)    Total Output 550     Net +170 +600        Urine Occurrence 2 x 7 x   Stool Occurrence       PHYSICAL EXAMINATION: General:  Resting comfortbaly Neuro:  Awake, alert, non focal HEENT:  PERRL, moist membranes Cardiovascular:  RRR, no m/r/g Lungs: CTAB Abdomen:  Soft, nontender, bowel sounds diminished Musculoskeletal:  Moves all extremities, no edema Skin:  Intact  LABS: CBC  Recent Labs Lab 01/30/13 1044 01/31/13 0325 02/01/13 0415  WBC 6.7 3.5* 3.0*  HGB 12.4 11.0* 11.6*  HCT 34.0* 32.0* 34.0*  PLT 160 131* 138*   Coag's No results found for this basename: APTT, INR,  in the last 168 hours BMET  Recent Labs Lab 01/30/13 1651 01/31/13 0325 02/01/13 0415  NA 132* 136  134*  K 3.5 3.2* 4.5  CL 97 103 99  CO2 21 25 27   BUN 5* 9 12  CREATININE 0.61 0.60 0.63  GLUCOSE 221* 251* 371*   Electrolytes  Recent Labs Lab 01/30/13 1651 01/31/13 0325 02/01/13 0415  CALCIUM 8.6 8.2* 9.7   Sepsis Markers  Recent Labs Lab 01/28/13 2347 01/29/13 0635 01/29/13 1045 01/30/13 0246 01/31/13 0325  LATICACIDVEN 1.9 0.9 1.0  --   --   PROCALCITON 0.98  --   --  2.42 0.79   ABG  Recent Labs Lab 01/28/13 2148 01/29/13 0014 01/29/13 0645  PHART 6.842* 7.012* 7.270*  PCO2ART 9.5* 10.6* 23.0*  PO2ART 150.0* 132.0* 103.0*   Liver Enzymes  Recent Labs Lab 01/28/13 1744  AST 27  ALT 16  ALKPHOS 104  BILITOT 0.2*  ALBUMIN 4.3   Cardiac Enzymes  Recent Labs Lab 01/28/13 2347 01/29/13 0635 01/29/13 1045  TROPONINI <0.30 <0.30 <0.30   Glucose  Recent Labs Lab 01/31/13 1203 01/31/13 1724 01/31/13 2117 02/01/13 0715 02/01/13 1146 02/01/13 1539  GLUCAP 308* 259* 327* 393* 342* 277*   CXR:  None today  ASSESSMENT / PLAN:   CARDIOVASCULAR A: Presumed sepsis, no overt source - resolved.  Lactate reassuring. H/o HTN. P:  Restart  Lisinopril Hold Clonidine   HEMATOLOGIC A:  Mild anemia. Thrombocytopenia. P:  Trend  CBC D/c Heparin, ambulate  INFECTIOUS A:  No evidence of infection. WBC normalized. Procalcitonin 0.98. Viral panel negative. P:   D/c abx Trend CBC  ENDOCRINE  A:  Brittle DM1. DKA. P:   Diabetes coordinator's input appreciated ct 3 units meal coverage Change SSI to resistant Lantus 25 (home dose 20)  NEUROLOGIC A:  Abdominal pain, improving. P:   Dilaudid frequency to q3h PRN    Likely to be discharged tomorrow once sugars lower.   Oretha Milch., MD Pulmonary and Critical Care Medicine [facility] Pager: [phone]  02/01/2013, 5:42 PM

## 2013-02-04 LAB — CULTURE, BLOOD (ROUTINE X 2)
Culture: NO GROWTH
Culture: NO GROWTH

## 2013-05-18 ENCOUNTER — Inpatient Hospital Stay (HOSPITAL_COMMUNITY)
Admission: EM | Admit: 2013-05-18 | Discharge: 2013-05-21 | DRG: 638 | Disposition: A | Discharge status: Home / Self Care | Payer: Medicaid Other | Attending: Internal Medicine | Admitting: Internal Medicine

## 2013-05-18 ENCOUNTER — Encounter (HOSPITAL_COMMUNITY): Payer: Self-pay | Admitting: Emergency Medicine

## 2013-05-18 ENCOUNTER — Emergency Department (HOSPITAL_COMMUNITY): Payer: Medicaid Other

## 2013-05-18 DIAGNOSIS — IMO0002 Reserved for concepts with insufficient information to code with codable children: Secondary | ICD-10-CM

## 2013-05-18 DIAGNOSIS — Z87891 Personal history of nicotine dependence: Secondary | ICD-10-CM

## 2013-05-18 DIAGNOSIS — E1110 Type 2 diabetes mellitus with ketoacidosis without coma: Secondary | ICD-10-CM

## 2013-05-18 DIAGNOSIS — R109 Unspecified abdominal pain: Secondary | ICD-10-CM | POA: Diagnosis present

## 2013-05-18 DIAGNOSIS — E1049 Type 1 diabetes mellitus with other diabetic neurological complication: Secondary | ICD-10-CM | POA: Diagnosis present

## 2013-05-18 DIAGNOSIS — E1010 Type 1 diabetes mellitus with ketoacidosis without coma: Principal | ICD-10-CM | POA: Diagnosis present

## 2013-05-18 DIAGNOSIS — R0989 Other specified symptoms and signs involving the circulatory and respiratory systems: Secondary | ICD-10-CM | POA: Diagnosis present

## 2013-05-18 DIAGNOSIS — F411 Generalized anxiety disorder: Secondary | ICD-10-CM | POA: Diagnosis present

## 2013-05-18 DIAGNOSIS — E876 Hypokalemia: Secondary | ICD-10-CM | POA: Diagnosis present

## 2013-05-18 DIAGNOSIS — E871 Hypo-osmolality and hyponatremia: Secondary | ICD-10-CM | POA: Diagnosis present

## 2013-05-18 DIAGNOSIS — E1065 Type 1 diabetes mellitus with hyperglycemia: Secondary | ICD-10-CM | POA: Diagnosis present

## 2013-05-18 DIAGNOSIS — Z794 Long term (current) use of insulin: Secondary | ICD-10-CM

## 2013-05-18 DIAGNOSIS — R0609 Other forms of dyspnea: Secondary | ICD-10-CM | POA: Diagnosis present

## 2013-05-18 DIAGNOSIS — E890 Postprocedural hypothyroidism: Secondary | ICD-10-CM | POA: Diagnosis present

## 2013-05-18 DIAGNOSIS — E860 Dehydration: Secondary | ICD-10-CM

## 2013-05-18 LAB — CBG MONITORING, ED
GLUCOSE-CAPILLARY: 414 mg/dL — AB (ref 70–99)
Glucose-Capillary: 350 mg/dL — ABNORMAL HIGH (ref 70–99)
Glucose-Capillary: 263 mg/dL — ABNORMAL HIGH (ref 70–99)

## 2013-05-18 LAB — BASIC METABOLIC PANEL
BUN: 14 mg/dL (ref 6–23)
BUN: 13 mg/dL (ref 6–23)
CALCIUM: 7.8 mg/dL — AB (ref 8.4–10.5)
CALCIUM: 7.6 mg/dL — AB (ref 8.4–10.5)
CHLORIDE: 98 meq/L (ref 96–112)
CO2: 7 mEq/L — CL (ref 19–32)
CO2: 9 meq/L — AB (ref 19–32)
Chloride: 98 mEq/L (ref 96–112)
Creatinine, Ser: 0.66 mg/dL (ref 0.50–1.10)
Creatinine, Ser: 0.63 mg/dL (ref 0.50–1.10)
GFR calc Af Amer: >90 mL/min (ref >90)
GFR calc non Af Amer: >90 mL/min (ref >90)
Glucose, Bld: 206 mg/dL — ABNORMAL HIGH (ref 70–99)
Glucose, Bld: 194 mg/dL — ABNORMAL HIGH (ref 70–99)
Potassium: 5 mEq/L (ref 3.7–5.3)
Potassium: 4.5 mEq/L (ref 3.7–5.3)
SODIUM: 134 meq/L — AB (ref 137–147)
SODIUM: 134 meq/L — AB (ref 137–147)

## 2013-05-18 LAB — I-STAT CHEM 8, ED
BUN: 25 mg/dL — AB (ref 6–23)
CALCIUM ION: 1.18 mmol/L (ref 1.12–1.23)
CREATININE: 0.8 mg/dL (ref 0.50–1.10)
Chloride: 102 mEq/L (ref 96–112)
Glucose, Bld: 436 mg/dL — ABNORMAL HIGH (ref 70–99)
HCT: 49 % — ABNORMAL HIGH (ref 36.0–46.0)
Hemoglobin: 16.7 g/dL — ABNORMAL HIGH (ref 12.0–15.0)
Potassium: 5.7 mEq/L — ABNORMAL HIGH (ref 3.7–5.3)
Sodium: 127 mEq/L — ABNORMAL LOW (ref 137–147)
TCO2: 9 mmol/L (ref 0–100)

## 2013-05-18 LAB — URINALYSIS, ROUTINE W REFLEX MICROSCOPIC
BILIRUBIN URINE: NEGATIVE
Ketones, ur: >80 mg/dL — AB
Leukocytes, UA: NEGATIVE
Nitrite: NEGATIVE
PH: 5 (ref 5.0–8.0)
Protein, ur: 30 mg/dL — AB
Specific Gravity, Urine: 1.02 (ref 1.005–1.030)
Urobilinogen, UA: 0.2 mg/dL (ref 0.0–1.0)

## 2013-05-18 LAB — COMPREHENSIVE METABOLIC PANEL
ALT: 117 U/L — ABNORMAL HIGH (ref 0–35)
AST: 218 U/L — AB (ref 0–37)
Albumin: 4.6 g/dL (ref 3.5–5.2)
Alkaline Phosphatase: 221 U/L — ABNORMAL HIGH (ref 39–117)
BILIRUBIN TOTAL: 0.3 mg/dL (ref 0.3–1.2)
BUN: 18 mg/dL (ref 6–23)
CHLORIDE: 81 meq/L — AB (ref 96–112)
Calcium: 9.4 mg/dL (ref 8.4–10.5)
Creatinine, Ser: 0.79 mg/dL (ref 0.50–1.10)
GFR calc Af Amer: >90 mL/min (ref >90)
Glucose, Bld: 433 mg/dL — ABNORMAL HIGH (ref 70–99)
POTASSIUM: 5.1 meq/L (ref 3.7–5.3)
Sodium: 126 mEq/L — ABNORMAL LOW (ref 137–147)
Total Protein: 9.1 g/dL — ABNORMAL HIGH (ref 6.0–8.3)

## 2013-05-18 LAB — CBC
HEMATOCRIT: 37.3 % (ref 36.0–46.0)
HEMOGLOBIN: 13.2 g/dL (ref 12.0–15.0)
MCH: 33.6 pg (ref 26.0–34.0)
MCHC: 35.4 g/dL (ref 30.0–36.0)
MCV: 94.9 fL (ref 78.0–100.0)
Platelets: 230 10*3/uL (ref 150–400)
RBC: 3.93 MIL/uL (ref 3.87–5.11)
RDW: 13.4 % (ref 11.5–15.5)
WBC: 10.9 10*3/uL — AB (ref 4.0–10.5)

## 2013-05-18 LAB — URINE MICROSCOPIC-ADD ON

## 2013-05-18 LAB — CBC WITH DIFFERENTIAL/PLATELET
Basophils Absolute: 0 10*3/uL (ref 0.0–0.1)
Basophils Relative: 0 % (ref 0–1)
EOS PCT: 0 % (ref 0–5)
Eosinophils Absolute: 0 10*3/uL (ref 0.0–0.7)
HCT: 42.3 % (ref 36.0–46.0)
Hemoglobin: 14.8 g/dL (ref 12.0–15.0)
LYMPHS ABS: 2.2 10*3/uL (ref 0.7–4.0)
Lymphocytes Relative: 17 % (ref 12–46)
MCH: 33.6 pg (ref 26.0–34.0)
MCHC: 35 g/dL (ref 30.0–36.0)
MCV: 96.1 fL (ref 78.0–100.0)
MONO ABS: 0.7 10*3/uL (ref 0.1–1.0)
Monocytes Relative: 5 % (ref 3–12)
NEUTROS ABS: 10.1 10*3/uL — AB (ref 1.7–7.7)
Neutrophils Relative %: 78 % — ABNORMAL HIGH (ref 43–77)
PLATELETS: 298 10*3/uL (ref 150–400)
RBC: 4.4 MIL/uL (ref 3.87–5.11)
RDW: 13.4 % (ref 11.5–15.5)
WBC MORPHOLOGY: INCREASED
WBC: 13 10*3/uL — ABNORMAL HIGH (ref 4.0–10.5)

## 2013-05-18 LAB — BLOOD GAS, VENOUS
Acid-base deficit: 25.6 mmol/L — ABNORMAL HIGH (ref 0.0–2.0)
Bicarbonate: 6.6 mEq/L — ABNORMAL LOW (ref 20.0–24.0)
O2 Saturation: 68.1 %
PATIENT TEMPERATURE: 98.6
PCO2 VEN: 29 mmHg — AB (ref 45.0–50.0)
TCO2: 6.6 mmol/L (ref 0–100)
pH, Ven: 6.988 — CL (ref 7.250–7.300)
pO2, Ven: 46.9 mmHg — ABNORMAL HIGH (ref 30.0–45.0)

## 2013-05-18 LAB — GLUCOSE, CAPILLARY
GLUCOSE-CAPILLARY: 167 mg/dL — AB (ref 70–99)
GLUCOSE-CAPILLARY: 189 mg/dL — AB (ref 70–99)
Glucose-Capillary: 211 mg/dL — ABNORMAL HIGH (ref 70–99)
Glucose-Capillary: 179 mg/dL — ABNORMAL HIGH (ref 70–99)

## 2013-05-18 LAB — LIPASE, BLOOD: LIPASE: 9 U/L — AB (ref 11–59)

## 2013-05-18 LAB — POC URINE PREG, ED: Preg Test, Ur: NEGATIVE

## 2013-05-18 LAB — MRSA PCR SCREENING: MRSA BY PCR: NEGATIVE

## 2013-05-18 LAB — HEMOGLOBIN A1C
HEMOGLOBIN A1C: 11.8 % — AB (ref <5.7)
MEAN PLASMA GLUCOSE: 292 mg/dL — AB (ref <117)

## 2013-05-18 MED ORDER — SODIUM CHLORIDE 0.9 % IV SOLN: 1000.0000 mL | INTRAVENOUS | Status: DC

## 2013-05-18 MED ORDER — DEXTROSE-NACL 5-0.45 % IV SOLN
INTRAVENOUS | Status: DC
  Administered 2013-05-18: 150 mL via INTRAVENOUS
  Administered 2013-05-19 (×2): via INTRAVENOUS
  Administered 2013-05-19: 150 mL via INTRAVENOUS

## 2013-05-18 MED ORDER — ONDANSETRON HCL 4 MG/2ML IJ SOLN: 4.0000 mg | Freq: Four times a day (QID) | INTRAMUSCULAR | Status: DC | PRN

## 2013-05-18 MED ORDER — HYDROCODONE-ACETAMINOPHEN 5-325 MG PO TABS
1.0000 | ORAL_TABLET | ORAL | Status: DC | PRN
  Administered 2013-05-20: 1 via ORAL
  Administered 2013-05-20: 2 via ORAL
  Administered 2013-05-20 (×2): 1 via ORAL
  Administered 2013-05-20 – 2013-05-21 (×4): 2 via ORAL
  Filled 2013-05-18 (×4): qty 2
  Filled 2013-05-18 (×3): qty 1
  Filled 2013-05-18: qty 2
  Filled 2013-05-18: qty 1
  Filled 2013-05-18: qty 2

## 2013-05-18 MED ORDER — ENOXAPARIN SODIUM 40 MG/0.4ML ~~LOC~~ SOLN
40.0000 mg | SUBCUTANEOUS | Status: DC
  Administered 2013-05-18 – 2013-05-20 (×3): 40 mg via SUBCUTANEOUS
  Filled 2013-05-18 (×4): qty 0.4

## 2013-05-18 MED ORDER — SODIUM BICARBONATE 8.4 % IV SOLN
50.0000 meq | Freq: Once | INTRAVENOUS | Status: AC
  Administered 2013-05-18: 50 meq via INTRAVENOUS
  Filled 2013-05-18: qty 50

## 2013-05-18 MED ORDER — INSULIN REGULAR HUMAN 100 UNIT/ML IJ SOLN
INTRAMUSCULAR | Status: AC
  Administered 2013-05-18: 2.9 [IU]/h via INTRAVENOUS
  Administered 2013-05-19: 4 [IU]/h via INTRAVENOUS
  Filled 2013-05-18 (×2): qty 1

## 2013-05-18 MED ORDER — FENTANYL CITRATE 0.05 MG/ML IJ SOLN
75.0000 ug | Freq: Once | INTRAMUSCULAR | Status: AC
  Administered 2013-05-18: 75 ug via INTRAVENOUS
  Filled 2013-05-18: qty 2

## 2013-05-18 MED ORDER — SODIUM BICARBONATE 4.2 % IV SOLN: 50.0000 meq | Freq: Once | INTRAVENOUS | Status: DC

## 2013-05-18 MED ORDER — ONDANSETRON HCL 4 MG PO TABS
4.0000 mg | ORAL_TABLET | Freq: Four times a day (QID) | ORAL | Status: DC | PRN
  Administered 2013-05-19 – 2013-05-20 (×3): 4 mg via ORAL
  Filled 2013-05-18 (×3): qty 1

## 2013-05-18 MED ORDER — HYDROMORPHONE HCL PF 1 MG/ML IJ SOLN
1.0000 mg | INTRAMUSCULAR | Status: DC | PRN
  Administered 2013-05-18 – 2013-05-20 (×16): 1 mg via INTRAVENOUS
  Filled 2013-05-18 (×17): qty 1

## 2013-05-18 MED ORDER — SODIUM CHLORIDE 0.9 % IV SOLN
1000.0000 mL | Freq: Once | INTRAVENOUS | Status: AC
  Administered 2013-05-18: 1000 mL via INTRAVENOUS

## 2013-05-18 MED ORDER — SODIUM BICARBONATE 8.4 % IV SOLN
INTRAVENOUS | Status: AC
  Filled 2013-05-18: qty 50

## 2013-05-18 MED ORDER — SODIUM BICARBONATE 8.4 % IV SOLN
100.0000 meq | Freq: Once | INTRAVENOUS | Status: AC
  Administered 2013-05-18: 100 meq via INTRAVENOUS
  Filled 2013-05-18: qty 100

## 2013-05-18 MED ORDER — SODIUM CHLORIDE 0.9 % IV SOLN
1000.0000 mL | INTRAVENOUS | Status: DC
Start: 2013-05-18 — End: 2013-05-18
  Administered 2013-05-18: 1000 mL via INTRAVENOUS

## 2013-05-18 MED ORDER — ONDANSETRON HCL 4 MG/2ML IJ SOLN
4.0000 mg | Freq: Once | INTRAMUSCULAR | Status: AC
  Administered 2013-05-18: 4 mg via INTRAVENOUS
  Filled 2013-05-18: qty 2

## 2013-05-18 MED ORDER — ACETAMINOPHEN 650 MG RE SUPP: 650.0000 mg | Freq: Four times a day (QID) | RECTAL | Status: DC | PRN

## 2013-05-18 MED ORDER — SODIUM CHLORIDE 0.9 % IJ SOLN
3.0000 mL | Freq: Two times a day (BID) | INTRAMUSCULAR | Status: DC
  Administered 2013-05-18 – 2013-05-20 (×3): 3 mL via INTRAVENOUS

## 2013-05-18 MED ORDER — ACETAMINOPHEN 325 MG PO TABS: 650.0000 mg | ORAL_TABLET | Freq: Four times a day (QID) | ORAL | Status: DC | PRN

## 2013-05-18 MED ORDER — GABAPENTIN 100 MG PO CAPS
200.0000 mg | ORAL_CAPSULE | Freq: Three times a day (TID) | ORAL | Status: DC
  Administered 2013-05-18 – 2013-05-21 (×8): 200 mg via ORAL
  Filled 2013-05-18 (×10): qty 2

## 2013-05-18 MED ORDER — GI COCKTAIL ~~LOC~~
30.0000 mL | Freq: Once | ORAL | Status: AC
  Administered 2013-05-18: 30 mL via ORAL
  Filled 2013-05-18: qty 30

## 2013-05-18 MED ORDER — SODIUM BICARBONATE 8.4 % IV SOLN
INTRAVENOUS | Status: AC
Start: 2013-05-18 — End: 2013-05-19
  Filled 2013-05-18: qty 100

## 2013-05-18 NOTE — H&P (Signed)
History and Physical  [name] HEN:[id number] DOB: 07/07/1980 DOA: 05/18/2013  Referring physician: Dr. Dorie Rank PCP: [name], MD   Chief Complaint: Hyperglycemia   History of Present Illness: [name] is an 33 y.o. female with a PMH of type I DM diagnosed at age 58, frequent episodes of DKA with last hospitalization 01/28/13-02/01/13, who presents with elevated blood glucoses, labored breathing and abdominal pain.  Abdominal pain is rated 8/10, constant, "like I did 2000 crunches in a row".  The patient reports starting her menses last week, and thinks this may have triggered it.  Symptoms began a few hour and were sudden in onset.  The patient states she took short acting insulin for a blood glucose of 387 earlier today, but her blood glucose was climbing despite insulin.  She took her normal dose of Lantus last evening.  She denies non-compliance.  Dilaudid usually helps the abdominal pain, and she says "they put something in my IV which usually helps my breathing".   She also has had nausea and polydipsia.  Review of Systems: Constitutional: No fever, no chills;  Appetite normal; No weight loss, no weight gain, + fatigue.  HEENT: No blurry vision, no diplopia, no pharyngitis, no dysphagia CV: No chest pain, no palpitations, +racing heart sensation, no PND.  Resp: + SOB, no cough, no pleuritic pain. GI: + nausea, no vomiting, no diarrhea, no melena, no hematochezia, no constipation.  GU: No dysuria, no hematuria, no frequency, no urgency. MSK: no myalgias, no arthralgias.  Neuro:  + headache, no focal neurological deficits, no history of seizures.  Psych: No depression, no anxiety.  Endo: No heat intolerance, no cold intolerance, no polyuria, + polydipsia  Skin: No rashes, no skin lesions.  Heme: No easy bruising.  Travel history:   Past Medical History Past Medical History  Diagnosis Date  . Diabetes mellitus   . Thyroid disease     hypothyroidism associated with  pregnancy  . Back pain   . Panic attack   . PONV (postoperative nausea and vomiting)   . Anxiety   . GERD (gastroesophageal reflux disease)   . DM gastroparesis      Past Surgical History Past Surgical History  Procedure Laterality Date  . Laparoscopy      age 34  . Back surgery      age 63  . Tooth extraction    . Root canal  10/10/12     Social History: History   Social History  . Marital Status: Single    Spouse Name: N/A    Number of Children: N/A  . Years of Education: N/A   Occupational History  . Not on file.   Social History Main Topics  . Smoking status: Former Smoker    Quit date: 04/19/2000  . Smokeless tobacco: Former Systems developer  . Alcohol Use: Yes     Comment: social, none now 03/01/2012  . Drug Use: No  . Sexual Activity: No   Other Topics Concern  . Not on file   Social History Narrative   Single.  Lives with children.    Family History:  Family History  Problem Relation Age of Onset  . Diabetes Maternal Grandmother   . Cancer Maternal Grandfather     Small cell lung cancer  . Cancer Maternal Grandmother     Leukemia    Allergies: Scallops; Morphine and related; and Latex  Meds: Prior to Admission medications   Medication Sig Start Date End Date Taking? Authorizing  Provider  cloNIDine (CATAPRES) 0.1 MG tablet Take 0.1 mg by mouth 2 (two) times daily.   Yes Historical Provider, MD  gabapentin (NEURONTIN) 100 MG capsule Take 200 mg by mouth 3 (three) times daily.    Yes Historical Provider, MD  glucagon (GLUCAGON EMERGENCY) 1 MG injection Inject 1 mg into the muscle once as needed (severe hypoglycemia).   Yes Historical Provider, MD  HYDROcodone-acetaminophen (NORCO/VICODIN) 5-325 MG per tablet Take 1 tablet by mouth every 6 (six) hours as needed for moderate pain.   Yes Historical Provider, MD  insulin aspart (NOVOLOG) 100 UNIT/ML injection Inject 1-3 Units into the skin 3 (three) times daily with meals. CBG < 70: implement hypoglycemia  protocol CBG > 400: call MD   CBG 70 - 120: 0 units CBG 121 - 150: 3 units CBG 151 - 200: 4 units CBG 201 - 250: 7 units CBG 251 - 300: 11 units CBG 301 - 350: 15 units CBG 351 - 400: 20 units CBG > 400: call MD 02/01/13  Yes Donita Brooks, NP  insulin glargine (LANTUS) 100 UNIT/ML injection Inject 20 Units into the skin every morning. 02/01/13  Yes Donita Brooks, NP  Multiple Vitamin (MULTIVITAMIN WITH MINERALS) TABS Take 1 tablet by mouth every morning. Diabetic support vitamin   Yes Historical Provider, MD  ondansetron (ZOFRAN-ODT) 8 MG disintegrating tablet Take 8 mg by mouth every 8 (eight) hours as needed for nausea or vomiting.   Yes Historical Provider, MD    Physical Exam: Filed Vitals:   05/18/13 1600 05/18/13 1619 05/18/13 1630 05/18/13 1700  BP: 124/81  123/72 127/72  Pulse: 102  105 103  Temp:      TempSrc:      Resp: 22  22 18   Height:  5' 4.5" (1.638 m)    Weight:  54.432 kg (120 lb)    SpO2: 100%  100% 100%     Physical Exam: Blood pressure 127/72, pulse 103, temperature 97.6 F (36.4 C), temperature source Oral, resp. rate 18, height 5' 4.5" (1.638 m), weight 54.432 kg (120 lb), last menstrual period 05/09/2013, SpO2 100.00%. Gen: Moderate respiratory distress with labored breathing. Acetone smell on breath. Head: Normocephalic, atraumatic. Eyes: PERRL, EOMI, sclerae nonicteric. Mouth: Oropharynx clear with dry mucous membranes. Neck: Supple, no thyromegaly, no lymphadenopathy, no jugular venous distention. Chest: Lungs clear to auscultation bilaterally. CV: Heart sounds are regular with no murmurs, rubs, or gallops. Abdomen: Soft, nontender, nondistended with normal active bowel sounds. Extremities: Extremities are without clubbing, edema, or cyanosis. Skin: Warm and dry. Neuro: Alert and oriented times 3; cranial nerves II through XII grossly intact. Psych: Mood and affect anxious.  Labs on Admission:  Basic Metabolic Panel:  Recent Labs Lab  05/18/13 1600 05/18/13 1608  NA 126* 127*  K 5.1 5.7*  CL 81* 102  CO2 <7*  --   GLUCOSE 433* 436*  BUN 18 25*  CREATININE 0.79 0.80  CALCIUM 9.4  --    Liver Function Tests:  Recent Labs Lab 05/18/13 1600  AST 218*  ALT 117*  ALKPHOS 221*  BILITOT 0.3  PROT 9.1*  ALBUMIN 4.6    Recent Labs Lab 05/18/13 1600  LIPASE 9*   CBC:  Recent Labs Lab 05/18/13 1600 05/18/13 1608  WBC 13.0*  --   NEUTROABS 10.1*  --   HGB 14.8 16.7*  HCT 42.3 49.0*  MCV 96.1  --   PLT 298  --     CBG:  Recent Labs Lab  05/18/13 1523 05/18/13 1656  GLUCAP 414* 350*    Radiological Exams on Admission: Dg Chest Portable 1 View  05/18/2013   CLINICAL DATA:  Shortness of breath  EXAM: PORTABLE CHEST - 1 VIEW  COMPARISON:  DG CHEST 1V PORT dated 01/28/2013  FINDINGS: The lungs are adequately inflated. There is no focal infiltrate. The cardiac silhouette is normal in size. The pulmonary vascularity is not engorged. The mediastinum is normal in width. There is no pleural effusion. 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  IMPRESSION: There is no evidence of pneumonia or other active cardiopulmonary disease.   Electronically Signed   By: David  Martinique   On: 05/18/2013 16:23    EKG: Independently reviewed. Sinus rhythm at 91 beats per minute.  Assessment/Plan Principal Problem:   DKA, type 1 Continue aggressive fluid replacement to correct both hypovolemia and hyperosmolality with NS at 200 cc an hour after a 2 L IV bolus. Add dextrose to the saline solution when the serum glucose reaches 250 mg/dL.  Start an insulin drip per glucommander protocol to correct hyperglycemia.  Monitor CBGs hourly until stable. Monitor BMET every 2 hours until stable.  Transition to basal/bolus insulin when the ketoacidosis has resolved and the patient's anion gap closes. Continue IV insulin infusion for one to two hours after initiating the SQ  insulin, to avoid recurrent hyperglycemia. We'll give an amp of sodium bicarbonate secondary to severe acidosis.  Currently hyperkalemic, but consider potassium replacement when potassium falls to less than 5.3. Active Problems:   Hyponatremia Likely reflective of severe hyperglycemia and should correct with normalization of blood glucoses.   DM (diabetes mellitus), type 1, uncontrolled Check hemoglobin A1c. Suspect uncontrolled. Diabetes coordinator consultation requested. DVT prophylaxis Lovenox ordered.  Code Status: Full. Family Communication: Kalman Drape (mother): [phone]. Disposition Plan: Home when stable.  Time spent: One hour.  Angellica Maddison [facility] Pager [phone] Cell: [phone]   If 7PM-7AM, please contact night-coverage [email or web address] Password [facility] 05/18/2013, 5:53 PM    **Disclaimer: This note was dictated with voice recognition software. Similar sounding words can inadvertently be transcribed and this note may contain transcription errors which may not have been corrected upon publication of note.**

## 2013-05-18 NOTE — ED Provider Notes (Signed)
CSN: [id number]     Arrival date & time 05/18/13  1519 History   First MD Initiated Contact with Patient 05/18/13 1540     Chief Complaint  Patient presents with  . Hyperglycemia    HPI Comments: Pt has a history of frequent DKA.   She noticed that her blood sugar was running high today.  She started to have tachypnea that she associates with Kussmaul breathing.  She also started to have pain in her abdomen and flank which she has had frequently in the past when she has had DKA.  She has felt nauseated but no vomiting.     Patient is a 33 y.o. female presenting with hyperglycemia. The history is provided by the patient.  Hyperglycemia Severity:  Severe Onset quality:  Gradual Duration:  1 day Timing:  Constant Progression:  Worsening Chronicity:  Chronic Diabetes status:  Controlled with insulin Time since last antidiabetic medication: She has taken her medications today.  no missed doses. Context: not change in medication, not noncompliance, not recent change in diet and not recent illness   Context comment:  She has had her menses recently and feels that her DKA often is associated wtih that. Associated symptoms: abdominal pain, increased thirst, nausea and shortness of breath   Associated symptoms: no blurred vision, no chest pain, no diaphoresis, no dysuria, no fever, no polyuria and no vomiting   Abdominal pain:    Location:  Epigastric   Quality:  Sharp   Severity:  Severe   Past Medical History  Diagnosis Date  . Diabetes mellitus   . Thyroid disease   . Back pain   . Panic attack   . PONV (postoperative nausea and vomiting)   . Anxiety   . GERD (gastroesophageal reflux disease)   . DM gastroparesis    Past Surgical History  Procedure Laterality Date  . Laparoscopy      age 69  . Back surgery      age 18  . Tooth extraction    . Root canal  10/10/12   Family History  Problem Relation Age of Onset  . Other Mother   . Other Father    History  Substance Use  Topics  . Smoking status: Former Smoker    Quit date: 04/19/2000  . Smokeless tobacco: Former Systems developer  . Alcohol Use: Yes     Comment: social, none now 03/01/2012   OB History   Grav Para Term Preterm Abortions TAB SAB Ect Mult Living                 Review of Systems  Constitutional: Negative for fever and diaphoresis.  Eyes: Negative for blurred vision.  Respiratory: Positive for shortness of breath.   Cardiovascular: Negative for chest pain.  Gastrointestinal: Positive for nausea and abdominal pain. Negative for vomiting.  Endocrine: Positive for polydipsia. Negative for polyuria.  Genitourinary: Negative for dysuria.  All other systems reviewed and are negative.      Allergies  Scallops; Morphine and related; and Latex  Home Medications   Current Outpatient Rx  Name  Route  Sig  Dispense  Refill  . cloNIDine (CATAPRES) 0.1 MG tablet   Oral   Take 0.1 mg by mouth 2 (two) times daily.         Marland Kitchen gabapentin (NEURONTIN) 100 MG capsule   Oral   Take 200 mg by mouth 3 (three) times daily.          Marland Kitchen glucagon (GLUCAGON EMERGENCY) 1  MG injection   Intramuscular   Inject 1 mg into the muscle once as needed (severe hypoglycemia).         Marland Kitchen HYDROcodone-acetaminophen (NORCO/VICODIN) 5-325 MG per tablet   Oral   Take 1 tablet by mouth every 6 (six) hours as needed for moderate pain.         Marland Kitchen insulin aspart (NOVOLOG) 100 UNIT/ML injection   Subcutaneous   Inject 1-3 Units into the skin 3 (three) times daily with meals. CBG < 70: implement hypoglycemia protocol CBG > 400: call MD   CBG 70 - 120: 0 units CBG 121 - 150: 3 units CBG 151 - 200: 4 units CBG 201 - 250: 7 units CBG 251 - 300: 11 units CBG 301 - 350: 15 units CBG 351 - 400: 20 units CBG > 400: call MD   1 vial   12   . insulin glargine (LANTUS) 100 UNIT/ML injection   Subcutaneous   Inject 0.25 mLs (25 Units total) into the skin every morning.   10 mL   12   . lisinopril (PRINIVIL,ZESTRIL) 2.5  MG tablet   Oral   Take 2.5 mg by mouth every evening.          . lovastatin (MEVACOR) 20 MG tablet   Oral   Take 20 mg by mouth at bedtime.         . Multiple Vitamin (MULTIVITAMIN WITH MINERALS) TABS   Oral   Take 1 tablet by mouth every morning. Diabetic support vitamin         . niacin 500 MG tablet   Oral   Take 1 tablet (500 mg total) by mouth at bedtime.   30 tablet   1   . ondansetron (ZOFRAN-ODT) 8 MG disintegrating tablet   Oral   Take 8 mg by mouth every 8 (eight) hours as needed for nausea or vomiting.          BP 118/83  Pulse 90  Temp(Src) 97.6 F (36.4 C) (Oral)  Resp 19  SpO2 100% Physical Exam  Nursing note and vitals reviewed. Constitutional: She appears well-developed and well-nourished. No distress.  HENT:  Head: Normocephalic and atraumatic.  Right Ear: External ear normal.  Left Ear: External ear normal.  Eyes: Conjunctivae are normal. Right eye exhibits no discharge. Left eye exhibits no discharge. No scleral icterus.  Neck: Neck supple. No tracheal deviation present.  Cardiovascular: Regular rhythm and intact distal pulses.  Tachycardia present.   Pulmonary/Chest: Breath sounds normal. No stridor. Tachypnea noted. No respiratory distress. She has no wheezes. She has no rales.  Abdominal: Soft. Bowel sounds are normal. She exhibits no distension. There is tenderness in the epigastric area. There is no rigidity, no rebound and no guarding. No hernia.  Musculoskeletal: She exhibits no edema and no tenderness.  Neurological: She is alert. She has normal strength. No cranial nerve deficit (no facial droop, extraocular movements intact, no slurred speech) or sensory deficit. She exhibits normal muscle tone. She displays no seizure activity. Coordination normal.  Skin: Skin is warm and dry. No rash noted.  Psychiatric: She has a normal mood and affect.    ED Course  Procedures (including critical care time) 1600  Angiocath  insertion Performed by: [email or web address],YNW R Consent: Verbal consent obtained. Risks and benefits: risks, benefits and alternatives were discussed Time out: Immediately prior to procedure a "time out" was called to verify the correct patient, procedure, equipment, support staff and site/side marked as required. Preparation: Patient was  prepped and draped in the usual sterile fashion. Vein Location: right AC Ultrasound Guided Gauge:20 Normal blood return and flush without difficulty Patient tolerance: Patient tolerated the procedure well with no immediate complications.  CRITICAL CARE Performed by: Kathalene Frames Total critical care time: 35 Critical care time was exclusive of separately billable procedures and treating other patients. Critical care was necessary to treat or prevent imminent or life-threatening deterioration. Critical care was time spent personally by me on the following activities: development of treatment plan with patient and/or surrogate as well as nursing, discussions with consultants, evaluation of patient's response to treatment, examination of patient, obtaining history from patient or surrogate, ordering and performing treatments and interventions, ordering and review of laboratory studies, ordering and review of radiographic studies, pulse oximetry and re-evaluation of patient's condition.   Labs Review Labs Reviewed  BLOOD GAS, VENOUS - Abnormal; Notable for the following:    pH, Ven 6.988 (*)    pCO2, Ven 29.0 (*)    pO2, Ven 46.9 (*)    Bicarbonate 6.6 (*)    Acid-base deficit 25.6 (*)    All other components within normal limits  CBG MONITORING, ED - Abnormal; Notable for the following:    Glucose-Capillary 414 (*)    All other components within normal limits  I-STAT CHEM 8, ED - Abnormal; Notable for the following:    Sodium 127 (*)    Potassium 5.7 (*)    BUN 25 (*)    Glucose, Bld 436 (*)    Hemoglobin 16.7 (*)    HCT 49.0 (*)    All other components within  normal limits  CBC WITH DIFFERENTIAL  URINALYSIS, ROUTINE W REFLEX MICROSCOPIC  COMPREHENSIVE METABOLIC PANEL  LIPASE, BLOOD  POC URINE PREG, ED   Imaging Review Dg Chest Portable 1 View  05/18/2013   CLINICAL DATA:  Shortness of breath  EXAM: PORTABLE CHEST - 1 VIEW  COMPARISON:  DG CHEST 1V PORT dated 01/28/2013  FINDINGS: The lungs are adequately inflated. There is no focal infiltrate. The cardiac silhouette is normal in size. The pulmonary vascularity is not engorged. The mediastinum is normal in width. There is no pleural effusion. 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  IMPRESSION: There is no evidence of pneumonia or other active cardiopulmonary disease.   Electronically Signed   By: David  Martinique   On: 05/18/2013 16:23     EKG Interpretation None     Medications  insulin regular (NOVOLIN R,HUMULIN R) 1 Units/mL in sodium chloride 0.9 % 100 mL infusion (not administered)  0.9 %  sodium chloride infusion (1,000 mLs Intravenous New Bag/Given 05/18/13 1613)    Followed by  0.9 %  sodium chloride infusion (1,000 mLs Intravenous New Bag/Given 05/18/13 1614)    Followed by  0.9 %  sodium chloride infusion (not administered)  ondansetron (ZOFRAN) injection 4 mg (4 mg Intravenous Given 05/18/13 1613)  fentaNYL (SUBLIMAZE) injection 75 mcg (75 mcg Intravenous Given 05/18/13 1613)    MDM   Final diagnoses:  DKA (diabetic ketoacidoses)    1618  Initial istat shows decreased bicarb, elevated anion gap with hyperglycemia.  Correlates with DKA.  VBG and additional labs pending.  Potassium elevated at 5.7.  Will continue with IV hydration, start insulin infusion.  1652  Discussed with Critical care service.  Pt appears to otherwise be stable.  Recommends medical admission.  Critical care can assist with e link.    Kathalene Frames, MD  05/19/13 0008 

## 2013-05-18 NOTE — ED Notes (Signed)
Unable to obtain IV access/labs at this time after multiple attempts.

## 2013-05-18 NOTE — Progress Notes (Signed)
Utilization Review completed.  Ezequias Lard RN CM  

## 2013-05-18 NOTE — ED Notes (Signed)
intensivist at bs

## 2013-05-18 NOTE — ED Notes (Signed)
Initial Contact - pt resting on stretcher, appears uncomfortable.  Pt reports blood sugars out of control, pt c/o 7/10 pain to upper abd and nausea.  Pt sts "i know i'm in DKA".  Skin PWD.  +kussmaul respirations.  Speaking full/clear sentences, lsctab.  +bsx4 quads.  abd s/nt/nd.  No active vomiting.  Dr. Tomi Bamberger at bs for eval.

## 2013-05-18 NOTE — ED Notes (Signed)
Bicarb ordered from pharm at this time.

## 2013-05-18 NOTE — ED Notes (Signed)
Pt aware of need for urine specimen, sts unable to provide at this time.  NAD.

## 2013-05-18 NOTE — ED Notes (Signed)
IVT paged to attempt 2nd IV access.

## 2013-05-18 NOTE — ED Notes (Signed)
[name]. EDP given I Stat results.

## 2013-05-18 NOTE — ED Notes (Signed)
PER EMS - pt from home with c/o hyperglycemia, pt also c/o abd pain and nausea, pt is a brittle diabetic.  A+Ox4.  Zofran 8mg  IM.

## 2013-05-18 NOTE — Progress Notes (Signed)
Inpatient Diabetes Program Recommendations  AACE/ADA: New Consensus Statement on Inpatient Glycemic Control (2013)  Target Ranges:  Prepandial:   less than 140 mg/dL      Peak postprandial:   less than 180 mg/dL (1-2 hours)      Critically ill patients:  140 - 180 mg/dL   Reason for Assessment: Received referral for this patient.  Patient admitted with DKA.  Glucose 433 mg/dl and CO2 <7 on admit.  Diabetes history: Type 1 DM since 2005 Home DM meds: Lantus 20 units daily + Novolog (1 unit for every 15 grams carbohydrates) + Novolog SSI   **Pt with multiple admissions for DKA.  Well known to the Inpatient DM Program.  **Per chart review, note that pt was seen by her Endocrinologist (Dr. Philemon Kingdom) on 11/27/2012.  Dr. Cruzita Lederer stated in her notes that pt was using an insulin pump for approximately 2 years, however, during that time, pt had 18 hospital visits for hyperglycemia/DKA.  Patient came off insulin pump in early 2014 and was restarted on basal/bolus regimen with injections.  **A1c of 11.8% (05/18/13) shows continued poor control at home.  DM Coordinators have met with patient multiple times.   **Note that CO2 on 2030pm BMET still low at 7.  Patient remains on IV insulin drip with GlucoStabilizer.  Dextrose added to IVF per orders at 8pm as well.   MD- When patient meets transition criteria (CO2 WNL, CBGs stable) please make sure pt receives at least 80% of her home basal insulin dose 1 hour before IV insulin drip discontinued.   Will follow. Wyn Quaker RN, MSN, CDE Diabetes Coordinator Inpatient Diabetes Program Team Pager: [phone] (8a-10p)

## 2013-05-18 NOTE — ED Notes (Signed)
Dr. Tomi Bamberger attempting IV access with U/S.

## 2013-05-18 NOTE — ED Notes (Signed)
Resp aware of blood in mini lab for VBG.

## 2013-05-18 NOTE — ED Notes (Signed)
Bed: WA21 Expected date:  Expected time:  Means of arrival:  Comments: EMS- Hyperglycemia

## 2013-05-19 LAB — BASIC METABOLIC PANEL
BUN: 12 mg/dL (ref 6–23)
BUN: 11 mg/dL (ref 6–23)
BUN: 10 mg/dL (ref 6–23)
BUN: 8 mg/dL (ref 6–23)
BUN: 4 mg/dL — AB (ref 6–23)
BUN: 7 mg/dL (ref 6–23)
BUN: 12 mg/dL (ref 6–23)
BUN: 5 mg/dL — ABNORMAL LOW (ref 6–23)
BUN: 9 mg/dL (ref 6–23)
CALCIUM: 8.1 mg/dL — AB (ref 8.4–10.5)
CALCIUM: 7.6 mg/dL — AB (ref 8.4–10.5)
CHLORIDE: 99 meq/L (ref 96–112)
CHLORIDE: 101 meq/L (ref 96–112)
CHLORIDE: 97 meq/L (ref 96–112)
CHLORIDE: 96 meq/L (ref 96–112)
CHLORIDE: 99 meq/L (ref 96–112)
CO2: 16 mEq/L — ABNORMAL LOW (ref 19–32)
CO2: 17 mEq/L — ABNORMAL LOW (ref 19–32)
CO2: 14 mEq/L — ABNORMAL LOW (ref 19–32)
CO2: 17 meq/L — AB (ref 19–32)
CO2: 17 mEq/L — ABNORMAL LOW (ref 19–32)
CO2: 18 meq/L — AB (ref 19–32)
CO2: 17 mEq/L — ABNORMAL LOW (ref 19–32)
CO2: 16 meq/L — AB (ref 19–32)
CO2: 16 meq/L — AB (ref 19–32)
CREATININE: 0.59 mg/dL (ref 0.50–1.10)
Calcium: 7.7 mg/dL — ABNORMAL LOW (ref 8.4–10.5)
Calcium: 7.5 mg/dL — ABNORMAL LOW (ref 8.4–10.5)
Calcium: 7.7 mg/dL — ABNORMAL LOW (ref 8.4–10.5)
Calcium: 7.7 mg/dL — ABNORMAL LOW (ref 8.4–10.5)
Calcium: 7.5 mg/dL — ABNORMAL LOW (ref 8.4–10.5)
Calcium: 8.1 mg/dL — ABNORMAL LOW (ref 8.4–10.5)
Calcium: 8.7 mg/dL (ref 8.4–10.5)
Chloride: 100 mEq/L (ref 96–112)
Chloride: 99 mEq/L (ref 96–112)
Chloride: 98 mEq/L (ref 96–112)
Chloride: 95 mEq/L — ABNORMAL LOW (ref 96–112)
Creatinine, Ser: 0.65 mg/dL (ref 0.50–1.10)
Creatinine, Ser: 0.62 mg/dL (ref 0.50–1.10)
Creatinine, Ser: 0.57 mg/dL (ref 0.50–1.10)
Creatinine, Ser: 0.58 mg/dL (ref 0.50–1.10)
Creatinine, Ser: 0.54 mg/dL (ref 0.50–1.10)
Creatinine, Ser: 0.53 mg/dL (ref 0.50–1.10)
Creatinine, Ser: 0.47 mg/dL — ABNORMAL LOW (ref 0.50–1.10)
Creatinine, Ser: 0.47 mg/dL — ABNORMAL LOW (ref 0.50–1.10)
GFR calc Af Amer: >90 mL/min (ref >90)
GFR calc Af Amer: >90 mL/min (ref >90)
GFR calc Af Amer: >90 mL/min (ref >90)
GFR calc Af Amer: >90 mL/min (ref >90)
GFR calc Af Amer: >90 mL/min (ref >90)
GFR calc Af Amer: >90 mL/min (ref >90)
GFR calc non Af Amer: >90 mL/min (ref >90)
GFR calc non Af Amer: >90 mL/min (ref >90)
GFR calc non Af Amer: >90 mL/min (ref >90)
GFR calc non Af Amer: >90 mL/min (ref >90)
GFR calc non Af Amer: >90 mL/min (ref >90)
GFR calc non Af Amer: >90 mL/min (ref >90)
GLUCOSE: 155 mg/dL — AB (ref 70–99)
GLUCOSE: 159 mg/dL — AB (ref 70–99)
GLUCOSE: 178 mg/dL — AB (ref 70–99)
Glucose, Bld: 133 mg/dL — ABNORMAL HIGH (ref 70–99)
Glucose, Bld: 118 mg/dL — ABNORMAL HIGH (ref 70–99)
Glucose, Bld: 155 mg/dL — ABNORMAL HIGH (ref 70–99)
Glucose, Bld: 194 mg/dL — ABNORMAL HIGH (ref 70–99)
Glucose, Bld: 248 mg/dL — ABNORMAL HIGH (ref 70–99)
Glucose, Bld: 181 mg/dL — ABNORMAL HIGH (ref 70–99)
POTASSIUM: 3.5 meq/L — AB (ref 3.7–5.3)
POTASSIUM: 4.4 meq/L (ref 3.7–5.3)
POTASSIUM: 3.6 meq/L — AB (ref 3.7–5.3)
POTASSIUM: 3.8 meq/L (ref 3.7–5.3)
POTASSIUM: 3.8 meq/L (ref 3.7–5.3)
Potassium: 3.8 mEq/L (ref 3.7–5.3)
Potassium: 3.6 mEq/L — ABNORMAL LOW (ref 3.7–5.3)
Potassium: 3.4 mEq/L — ABNORMAL LOW (ref 3.7–5.3)
Potassium: 3.8 mEq/L (ref 3.7–5.3)
SODIUM: 135 meq/L — AB (ref 137–147)
SODIUM: 137 meq/L (ref 137–147)
SODIUM: 137 meq/L (ref 137–147)
Sodium: 135 mEq/L — ABNORMAL LOW (ref 137–147)
Sodium: 135 mEq/L — ABNORMAL LOW (ref 137–147)
Sodium: 134 mEq/L — ABNORMAL LOW (ref 137–147)
Sodium: 130 mEq/L — ABNORMAL LOW (ref 137–147)
Sodium: 130 mEq/L — ABNORMAL LOW (ref 137–147)
Sodium: 133 mEq/L — ABNORMAL LOW (ref 137–147)

## 2013-05-19 LAB — GLUCOSE, CAPILLARY
GLUCOSE-CAPILLARY: 122 mg/dL — AB (ref 70–99)
GLUCOSE-CAPILLARY: 170 mg/dL — AB (ref 70–99)
GLUCOSE-CAPILLARY: 232 mg/dL — AB (ref 70–99)
GLUCOSE-CAPILLARY: 146 mg/dL — AB (ref 70–99)
GLUCOSE-CAPILLARY: 142 mg/dL — AB (ref 70–99)
GLUCOSE-CAPILLARY: 136 mg/dL — AB (ref 70–99)
GLUCOSE-CAPILLARY: 130 mg/dL — AB (ref 70–99)
GLUCOSE-CAPILLARY: 150 mg/dL — AB (ref 70–99)
GLUCOSE-CAPILLARY: 158 mg/dL — AB (ref 70–99)
Glucose-Capillary: 119 mg/dL — ABNORMAL HIGH (ref 70–99)
Glucose-Capillary: 119 mg/dL — ABNORMAL HIGH (ref 70–99)
Glucose-Capillary: 119 mg/dL — ABNORMAL HIGH (ref 70–99)
Glucose-Capillary: 126 mg/dL — ABNORMAL HIGH (ref 70–99)
Glucose-Capillary: 126 mg/dL — ABNORMAL HIGH (ref 70–99)
Glucose-Capillary: 143 mg/dL — ABNORMAL HIGH (ref 70–99)
Glucose-Capillary: 147 mg/dL — ABNORMAL HIGH (ref 70–99)
Glucose-Capillary: 156 mg/dL — ABNORMAL HIGH (ref 70–99)
Glucose-Capillary: 173 mg/dL — ABNORMAL HIGH (ref 70–99)
Glucose-Capillary: 188 mg/dL — ABNORMAL HIGH (ref 70–99)
Glucose-Capillary: 189 mg/dL — ABNORMAL HIGH (ref 70–99)
Glucose-Capillary: 192 mg/dL — ABNORMAL HIGH (ref 70–99)
Glucose-Capillary: 228 mg/dL — ABNORMAL HIGH (ref 70–99)
Glucose-Capillary: 246 mg/dL — ABNORMAL HIGH (ref 70–99)
Glucose-Capillary: 97 mg/dL (ref 70–99)

## 2013-05-19 LAB — CBC
HEMATOCRIT: 30.8 % — AB (ref 36.0–46.0)
HEMOGLOBIN: 11 g/dL — AB (ref 12.0–15.0)
MCH: 32.6 pg (ref 26.0–34.0)
MCHC: 35.7 g/dL (ref 30.0–36.0)
MCV: 91.4 fL (ref 78.0–100.0)
PLATELETS: 164 10*3/uL (ref 150–400)
RBC: 3.37 MIL/uL — ABNORMAL LOW (ref 3.87–5.11)
RDW: 13.8 % (ref 11.5–15.5)
WBC: 5.8 10*3/uL (ref 4.0–10.5)

## 2013-05-19 MED ORDER — POTASSIUM CHLORIDE CRYS ER 20 MEQ PO TBCR
40.0000 meq | EXTENDED_RELEASE_TABLET | Freq: Once | ORAL | Status: AC
  Administered 2013-05-19: 40 meq via ORAL
  Filled 2013-05-19: qty 2

## 2013-05-19 MED ORDER — POTASSIUM CHLORIDE 10 MEQ/100ML IV SOLN
10.0000 meq | INTRAVENOUS | Status: DC
  Administered 2013-05-19: 10 meq via INTRAVENOUS
  Filled 2013-05-19 (×2): qty 100

## 2013-05-19 NOTE — Progress Notes (Signed)
CRITICAL VALUE ALERT  Critical value received:  CO2  7  Date of notification:  05/18/13  Time of notification:  2132  Critical value read back:yes  Nurse who received alert:  Jari Favre   RN  MD notified (1st page):  Walden Field NP  Time of first page:  2140  MD notified (2nd page):  Time of second page:  Responding MD:  Walden Field NP  Time MD responded:  2145

## 2013-05-19 NOTE — Progress Notes (Signed)
CRITICAL VALUE ALERT  Critical value received:  CO2=  9  Date of notification:  05/18/13    Time of notification:  2322  Critical value read back:yes  Nurse who received alert:  Jari Favre RN  MD notified (1st page):  Walden Field NP  Time of first page:  2330  MD notified (2nd page):  Time of second page:  Responding MD:  Walden Field NP  Time MD responded:  [phone]

## 2013-05-19 NOTE — Progress Notes (Signed)
PROGRESS NOTE   [name]:[id number] DOB: 1980/03/15 DOA: 05/18/2013 PCP: [name], MD  Brief narrative: [name] is an 33 y.o. female with PMH of type 1 diabetes diagnosed at age 25, frequent episodes of DKA with last hospitalization 01/28/13-02/01/13 who was admitted with recurrent DKA on 05/18/13.  Assessment/Plan: Principal Problem:  DKA, type 1  Aggressively hydrated with a 2 L normal saline bolus followed by normal saline at 200 cc an hour. When blood glucoses fell to less than 250, dextrose was added to her IV fluids.  Continue insulin drip per glucommander protocol to correct hyperglycemia. Continue to monitor CBGs hourly until off the glucose stabilizer. Continue to monitor BMET every 2 hours until stable. Transition to basal/bolus insulin when the ketoacidosis has resolved and the patient's anion gap closes. Will need at least 80% of her home basal dose to be given one hour before the glucose stabilizer is discontinued. Metabolic acidosis is slowly improving. Replace potassium. CBGs 97-170. Anion gap still elevated at 20. Active Problems:  Hypokalemia Will give 40 mEq oral replacement (cannot tolerate IV runs). Hyponatremia  Improving with correction of blood glucoses.  DM (diabetes mellitus), type 1, uncontrolled  Hemoglobin A1c is 11.8% corresponding to a mean plasma glucose of 292.  Diabetes coordinator following the patient.  DVT prophylaxis  Lovenox ordered.   Code Status: Full.  Family Communication: [name] (mother): [phone] updated 05/18/13.  Disposition Plan: Home when stable.   IV access:  Peripheral IV  Medical Consultants:  None  Other Consultants:  Diabetes coordinator  Anti-infectives:  None  HPI/Subjective: [name] continues to complain of abdominal pain. No nausea or vomiting. Feels ready to start eating. Dyspnea much better today.  Complains of a "sore bottom".  Objective: Filed Vitals:   05/19/13 0000  05/19/13 0303 05/19/13 0400 05/19/13 0600  BP: 98/61 86/59 92/43  91/53  Pulse: 104 97 94 93  Temp: 98.4 F (36.9 C)  98.6 F (37 C)   TempSrc: Oral  Oral   Resp: 28 13 24 19   Height:      Weight:   58.196 kg (128 lb 4.8 oz)   SpO2: 99% 100% 98% 97%    Intake/Output Summary (Last 24 hours) at 05/19/13 0730 Last data filed at 05/19/13 0600  Gross per 24 hour  Intake 1904.01 ml  Output      0 ml  Net 1904.01 ml    Exam: Gen:  NAD Cardiovascular:  RRR, No M/R/G Respiratory:  Lungs CTAB Gastrointestinal:  Abdomen soft, NT/ND, + BS Perirectal/perianal area: Excoriated skin to labia majora, skin tear superior aspect of gluteal fold. Extremities:  No C/E/C  Data Reviewed: Basic Metabolic Panel:  Recent Labs Lab 05/18/13 2214 05/18/13 2348 05/19/13 0215 05/19/13 0348 05/19/13 0629  NA 134* 137 135* 137 135*  K 4.5 3.8 3.8 3.8 3.6*  CL 98 100 99 101 99  CO2 9* 16* 17* 16* 16*  GLUCOSE 194* 133* 118* 155* 194*  BUN 13 12 12 11 10   CREATININE 0.63 0.65 0.62 0.59 0.57  CALCIUM 7.6* 7.6* 7.7* 7.5* 7.7*   GFR Estimated Creatinine Clearance: 88.3 ml/min (by C-G formula based on Cr of 0.57). Liver Function Tests:  Recent Labs Lab 05/18/13 1600  AST 218*  ALT 117*  ALKPHOS 221*  BILITOT 0.3  PROT 9.1*  ALBUMIN 4.6    Recent Labs Lab 05/18/13 1600  LIPASE 9*   CBC:  Recent Labs Lab 05/18/13 1600 05/18/13 1608 05/18/13 2031 05/19/13  0629  WBC 13.0*  --  10.9* 5.8  NEUTROABS 10.1*  --   --   --   HGB 14.8 16.7* 13.2 11.0*  HCT 42.3 49.0* 37.3 30.8*  MCV 96.1  --  94.9 91.4  PLT 298  --  230 164   CBG:  Recent Labs Lab 05/19/13 0153 05/19/13 0256 05/19/13 0359 05/19/13 0501 05/19/13 0605  GLUCAP 97 119* 122* 156* 170*   Hgb A1c  Recent Labs  05/18/13 1600  HGBA1C 11.8*   Microbiology Recent Results (from the past 240 hour(s))  MRSA PCR SCREENING     Status: None   Collection Time    05/18/13  6:46 PM      Result Value Ref Range  Status   MRSA by PCR NEGATIVE  NEGATIVE Final   Comment:            The GeneXpert MRSA Assay (FDA     approved for NASAL specimens     only), is one component of a     comprehensive MRSA colonization     surveillance program. It is not     intended to diagnose MRSA     infection nor to guide or     monitor treatment for     MRSA infections.     Procedures and Diagnostic Studies: Dg Chest Portable 1 View  05/18/2013   CLINICAL DATA:  Shortness of breath  EXAM: PORTABLE CHEST - 1 VIEW  COMPARISON:  DG CHEST 1V PORT dated 01/28/2013  FINDINGS: The lungs are adequately inflated. There is no focal infiltrate. The cardiac silhouette is normal in size. The pulmonary vascularity is not engorged. The mediastinum is normal in width. There is no pleural effusion. 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  IMPRESSION: There is no evidence of pneumonia or other active cardiopulmonary disease.   Electronically Signed   By: David  Martinique   On: 05/18/2013 16:23    Scheduled Meds: . enoxaparin (LOVENOX) injection  40 mg Subcutaneous Q24H  . gabapentin  200 mg Oral TID  . sodium chloride  3 mL Intravenous Q12H   Continuous Infusions: . dextrose 5 % and 0.45% NaCl 150 mL (05/19/13 0153)  . insulin (NOVOLIN-R) infusion Stopped (05/19/13 0300)    Time spent: 35 minutes. The patient requires high complexity decision making and is critically ill.   LOS: 1 day   [name]  [facility] Pager [phone]. If unable to reach me by pager, please call my cell phone at [phone].  *Please note that the hospitalists switch teams on Wednesdays. Please call the flow manager at [phone] if you are having difficulty reaching the hospitalist taking care of this patient as she can update you and provide the most up-to-date pager number of provider caring for the patient. If 8PM-8AM, please contact night-coverage at [email or web address], password  [facility]  05/19/2013, 7:30 AM    **Disclaimer: This note was dictated with voice recognition software. Similar sounding words can inadvertently be transcribed and this note may contain transcription errors which may not have been corrected upon publication of note.**

## 2013-05-20 DIAGNOSIS — E876 Hypokalemia: Secondary | ICD-10-CM

## 2013-05-20 LAB — BASIC METABOLIC PANEL
BUN: 3 mg/dL — ABNORMAL LOW (ref 6–23)
BUN: 3 mg/dL — ABNORMAL LOW (ref 6–23)
BUN: 3 mg/dL — AB (ref 6–23)
BUN: 3 mg/dL — ABNORMAL LOW (ref 6–23)
CALCIUM: 8.5 mg/dL (ref 8.4–10.5)
CO2: 20 mEq/L (ref 19–32)
CO2: 19 mEq/L (ref 19–32)
CO2: 18 mEq/L — ABNORMAL LOW (ref 19–32)
CO2: 21 mEq/L (ref 19–32)
CREATININE: 0.48 mg/dL — AB (ref 0.50–1.10)
Calcium: 8.5 mg/dL (ref 8.4–10.5)
Calcium: 8.5 mg/dL (ref 8.4–10.5)
Calcium: 9.1 mg/dL (ref 8.4–10.5)
Chloride: 102 mEq/L (ref 96–112)
Chloride: 98 mEq/L (ref 96–112)
Chloride: 104 mEq/L (ref 96–112)
Chloride: 104 mEq/L (ref 96–112)
Creatinine, Ser: 0.49 mg/dL — ABNORMAL LOW (ref 0.50–1.10)
Creatinine, Ser: 0.6 mg/dL (ref 0.50–1.10)
Creatinine, Ser: 0.54 mg/dL (ref 0.50–1.10)
GFR calc Af Amer: >90 mL/min (ref >90)
GLUCOSE: 190 mg/dL — AB (ref 70–99)
GLUCOSE: 262 mg/dL — AB (ref 70–99)
Glucose, Bld: 187 mg/dL — ABNORMAL HIGH (ref 70–99)
Glucose, Bld: 180 mg/dL — ABNORMAL HIGH (ref 70–99)
POTASSIUM: 3.3 meq/L — AB (ref 3.7–5.3)
POTASSIUM: 4.6 meq/L (ref 3.7–5.3)
POTASSIUM: 3.9 meq/L (ref 3.7–5.3)
Potassium: 3.8 mEq/L (ref 3.7–5.3)
SODIUM: 133 meq/L — AB (ref 137–147)
SODIUM: 139 meq/L (ref 137–147)
Sodium: 133 mEq/L — ABNORMAL LOW (ref 137–147)
Sodium: 137 mEq/L (ref 137–147)

## 2013-05-20 LAB — GLUCOSE, CAPILLARY
GLUCOSE-CAPILLARY: 153 mg/dL — AB (ref 70–99)
GLUCOSE-CAPILLARY: 161 mg/dL — AB (ref 70–99)
GLUCOSE-CAPILLARY: 257 mg/dL — AB (ref 70–99)
GLUCOSE-CAPILLARY: 223 mg/dL — AB (ref 70–99)
GLUCOSE-CAPILLARY: 137 mg/dL — AB (ref 70–99)
GLUCOSE-CAPILLARY: 130 mg/dL — AB (ref 70–99)
GLUCOSE-CAPILLARY: 219 mg/dL — AB (ref 70–99)
GLUCOSE-CAPILLARY: 153 mg/dL — AB (ref 70–99)
GLUCOSE-CAPILLARY: 89 mg/dL (ref 70–99)
GLUCOSE-CAPILLARY: 169 mg/dL — AB (ref 70–99)
GLUCOSE-CAPILLARY: 164 mg/dL — AB (ref 70–99)
Glucose-Capillary: 107 mg/dL — ABNORMAL HIGH (ref 70–99)
Glucose-Capillary: 113 mg/dL — ABNORMAL HIGH (ref 70–99)
Glucose-Capillary: 119 mg/dL — ABNORMAL HIGH (ref 70–99)
Glucose-Capillary: 131 mg/dL — ABNORMAL HIGH (ref 70–99)
Glucose-Capillary: 149 mg/dL — ABNORMAL HIGH (ref 70–99)
Glucose-Capillary: 161 mg/dL — ABNORMAL HIGH (ref 70–99)
Glucose-Capillary: 173 mg/dL — ABNORMAL HIGH (ref 70–99)
Glucose-Capillary: 206 mg/dL — ABNORMAL HIGH (ref 70–99)
Glucose-Capillary: 58 mg/dL — ABNORMAL LOW (ref 70–99)
Glucose-Capillary: 66 mg/dL — ABNORMAL LOW (ref 70–99)

## 2013-05-20 MED ORDER — LIVING WELL WITH DIABETES BOOK
Freq: Once | Status: AC
  Administered 2013-05-20: 1
  Filled 2013-05-20: qty 1

## 2013-05-20 MED ORDER — DEXTROSE 50 % IV SOLN: 25.0000 mL | INTRAVENOUS | Status: DC | PRN

## 2013-05-20 MED ORDER — POTASSIUM CHLORIDE CRYS ER 20 MEQ PO TBCR
40.0000 meq | EXTENDED_RELEASE_TABLET | Freq: Once | ORAL | Status: AC
  Administered 2013-05-20: 40 meq via ORAL
  Filled 2013-05-20: qty 2

## 2013-05-20 MED ORDER — INSULIN GLARGINE 100 UNIT/ML ~~LOC~~ SOLN
18.0000 [IU] | Freq: Every day | SUBCUTANEOUS | Status: DC
  Administered 2013-05-20: 18 [IU] via SUBCUTANEOUS
  Filled 2013-05-20 (×3): qty 0.18

## 2013-05-20 MED ORDER — INSULIN ASPART 100 UNIT/ML ~~LOC~~ SOLN
0.0000 [IU] | Freq: Three times a day (TID) | SUBCUTANEOUS | Status: DC
  Administered 2013-05-21: 5 [IU] via SUBCUTANEOUS

## 2013-05-20 MED ORDER — INSULIN ASPART 100 UNIT/ML ~~LOC~~ SOLN: 0.0000 [IU] | Freq: Every day | SUBCUTANEOUS | Status: DC

## 2013-05-20 MED ORDER — GLUCOSE 40 % PO GEL: 1.0000 | ORAL | Status: DC | PRN

## 2013-05-20 MED ORDER — DEXTROSE 50 % IV SOLN: 50.0000 mL | INTRAVENOUS | Status: DC | PRN

## 2013-05-20 MED ORDER — INSULIN ASPART 100 UNIT/ML ~~LOC~~ SOLN
3.0000 [IU] | Freq: Three times a day (TID) | SUBCUTANEOUS | Status: DC
  Administered 2013-05-21: 3 [IU] via SUBCUTANEOUS

## 2013-05-20 MED ORDER — SODIUM CHLORIDE 0.9 % IV SOLN
INTRAVENOUS | Status: DC
  Administered 2013-05-21: 03:00:00 via INTRAVENOUS

## 2013-05-20 MED ORDER — DEXTROSE 50 % IV SOLN
17.0000 mL | Freq: Once | INTRAVENOUS | Status: AC
  Administered 2013-05-20: 17 mL via INTRAVENOUS

## 2013-05-20 MED ORDER — DEXTROSE 50 % IV SOLN
INTRAVENOUS | Status: AC
  Filled 2013-05-20: qty 50

## 2013-05-20 MED ORDER — HYDROMORPHONE HCL PF 1 MG/ML IJ SOLN
1.0000 mg | INTRAMUSCULAR | Status: DC | PRN
  Administered 2013-05-20 – 2013-05-21 (×4): 1 mg via INTRAVENOUS
  Filled 2013-05-20 (×4): qty 1

## 2013-05-20 NOTE — Progress Notes (Signed)
PROGRESS NOTE   CORENE RESNICK PIR:[id number] DOB: 22-Sep-1980 DOA: 05/18/2013 PCP: [name], MD  Brief narrative: [name] is an 33 y.o. female with PMH of type 1 diabetes diagnosed at age 26, frequent episodes of DKA with last hospitalization 01/28/13-02/01/13 who was admitted with recurrent DKA on 05/18/13.  Assessment/Plan: Principal Problem:  DKA, type 1  Aggressively hydrated with a 2 L normal saline bolus followed by normal saline at 200 cc an hour. When blood glucoses fell to less than 250, dextrose was added to her IV fluids.  The patient is now eating, so will d/c dextrose in IVF.  Continue insulin drip per glucommander protocol to correct hyperglycemia, should be able to transition soon. Continue to monitor CBGs hourly until off the glucose stabilizer.  Transition to basal/bolus insulin when the ketoacidosis has resolved and the patient's anion gap closes. Will need at least 80% of her home basal dose to be given one hour before the glucose stabilizer is discontinued. Metabolic acidosis resolved.  CBGs 113-173. Anion gap 11 at 0300, but now slightly up at 16. Active Problems:  Hypokalemia Corrected with supplementation. Hyponatremia  Improving with correction of blood glucoses.  DM (diabetes mellitus), type 1, uncontrolled  Hemoglobin A1c is 11.8% corresponding to a mean plasma glucose of 292.  Diabetes coordinator following the patient.  DVT prophylaxis  Lovenox ordered.   Code Status: Full.  Family Communication: Kalman Drape (mother): [phone] updated 05/18/13.  Disposition Plan: Home when stable.   IV access:  Peripheral IV  Medical Consultants:  None  Other Consultants:  Diabetes coordinator  Anti-infectives:  None  HPI/Subjective: [name] continues to complain of some abdominal pain and nausea. No nausea or vomiting.  No dyspnea.  Objective: Filed Vitals:   05/20/13 0000 05/20/13 0200 05/20/13 0400 05/20/13 0800  BP: 111/78  105/77 113/90 123/80  Pulse: 76 87 95 82  Temp: 97.3 F (36.3 C)  98.6 F (37 C) 98.1 F (36.7 C)  TempSrc: Oral  Oral Oral  Resp: 21 27 14 17   Height:      Weight:   60.147 kg (132 lb 9.6 oz)   SpO2: 94% 96% 100% 100%    Intake/Output Summary (Last 24 hours) at 05/20/13 0818 Last data filed at 05/20/13 0800  Gross per 24 hour  Intake 3744.59 ml  Output   1650 ml  Net 2094.59 ml    Exam: Gen:  NAD Cardiovascular:  RRR, No M/R/G Respiratory:  Lungs CTAB Gastrointestinal:  Abdomen soft, NT/ND, + BS Extremities:  No C/E/C  Data Reviewed: Basic Metabolic Panel:  Recent Labs Lab 05/19/13 1445 05/19/13 1822 05/19/13 2229 05/20/13 0323 05/20/13 0746  NA 130* 130* 133* 133* 133*  K 3.8 3.5* 4.4 3.8 3.3*  CL 95* 96 99 102 98  CO2 17* 17* 18* 20 19  GLUCOSE 159* 178* 181* 190* 262*  BUN 7 5* 4* 3* 3*  CREATININE 0.53 0.47* 0.47* 0.48* 0.49*  CALCIUM 7.5* 8.1* 8.7 8.5 8.5   GFR Estimated Creatinine Clearance: 88.3 ml/min (by C-G formula based on Cr of 0.49). Liver Function Tests:  Recent Labs Lab 05/18/13 1600  AST 218*  ALT 117*  ALKPHOS 221*  BILITOT 0.3  PROT 9.1*  ALBUMIN 4.6    Recent Labs Lab 05/18/13 1600  LIPASE 9*   CBC:  Recent Labs Lab 05/18/13 1600 05/18/13 1608 05/18/13 2031 05/19/13 0629  WBC 13.0*  --  10.9* 5.8  NEUTROABS 10.1*  --   --   --  HGB 14.8 16.7* 13.2 11.0*  HCT 42.3 49.0* 37.3 30.8*  MCV 96.1  --  94.9 91.4  PLT 298  --  230 164   CBG:  Recent Labs Lab 05/20/13 0153 05/20/13 0243 05/20/13 0402 05/20/13 0502 05/20/13 0759  GLUCAP 153* 161* 149* 113* 223*   Hgb A1c  Recent Labs  05/18/13 1600  HGBA1C 11.8*   Microbiology Recent Results (from the past 240 hour(s))  MRSA PCR SCREENING     Status: None   Collection Time    05/18/13  6:46 PM      Result Value Ref Range Status   MRSA by PCR NEGATIVE  NEGATIVE Final   Comment:            The GeneXpert MRSA Assay (FDA     approved for NASAL specimens      only), is one component of a     comprehensive MRSA colonization     surveillance program. It is not     intended to diagnose MRSA     infection nor to guide or     monitor treatment for     MRSA infections.     Procedures and Diagnostic Studies: Dg Chest Portable 1 View  05/18/2013   CLINICAL DATA:  Shortness of breath  EXAM: PORTABLE CHEST - 1 VIEW  COMPARISON:  DG CHEST 1V PORT dated 01/28/2013  FINDINGS: The lungs are adequately inflated. There is no focal infiltrate. The cardiac silhouette is normal in size. The pulmonary vascularity is not engorged. The mediastinum is normal in width. There is no pleural effusion. 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  IMPRESSION: There is no evidence of pneumonia or other active cardiopulmonary disease.   Electronically Signed   By: David  Martinique   On: 05/18/2013 16:23    Scheduled Meds: . enoxaparin (LOVENOX) injection  40 mg Subcutaneous Q24H  . gabapentin  200 mg Oral TID  . sodium chloride  3 mL Intravenous Q12H   Continuous Infusions: . dextrose 5 % and 0.45% NaCl 150 mL/hr at 05/19/13 2132  . insulin (NOVOLIN-R) infusion 4.9 Units/hr (05/20/13 0800)    Time spent: 35 minutes. The patient requires high complexity decision making.   LOS: 2 days   RAMA,CHRISTINA  [facility] Pager [phone]. If unable to reach me by pager, please call my cell phone at [phone].  *Please note that the hospitalists switch teams on Wednesdays. Please call the flow manager at [phone] if you are having difficulty reaching the hospitalist taking care of this patient as she can update you and provide the most up-to-date pager number of provider caring for the patient. If 8PM-8AM, please contact night-coverage at [email or web address], password [email or web address]  05/20/2013, 8:18 AM    **Disclaimer: This note was dictated with voice recognition software. Similar sounding words can inadvertently be  transcribed and this note may contain transcription errors which may not have been corrected upon publication of note.**

## 2013-05-20 NOTE — Progress Notes (Signed)
Update: Last BMP shows gap closed at 14 and CO2 21. Orders written to transition pt to Lantus, SSI, diet and NS at 50cc/hr. Pt has no other medical issues that warrant a SDU bed, therefore, will transfer pt to med surg. VS and labs reviewed over past 24 hours and pt is stable now that gap is closed. Next BMP 0500 05/21/13.  Clance Boll, NP [facility]

## 2013-05-21 LAB — GLUCOSE, CAPILLARY
GLUCOSE-CAPILLARY: 186 mg/dL — AB (ref 70–99)
GLUCOSE-CAPILLARY: 83 mg/dL (ref 70–99)
GLUCOSE-CAPILLARY: 284 mg/dL — AB (ref 70–99)
Glucose-Capillary: 161 mg/dL — ABNORMAL HIGH (ref 70–99)
Glucose-Capillary: 177 mg/dL — ABNORMAL HIGH (ref 70–99)
Glucose-Capillary: 27 mg/dL — CL (ref 70–99)

## 2013-05-21 LAB — BASIC METABOLIC PANEL
BUN: 3 mg/dL — ABNORMAL LOW (ref 6–23)
CHLORIDE: 103 meq/L (ref 96–112)
CO2: 20 meq/L (ref 19–32)
Calcium: 9.3 mg/dL (ref 8.4–10.5)
Creatinine, Ser: 0.53 mg/dL (ref 0.50–1.10)
GFR calc non Af Amer: >90 mL/min (ref >90)
Glucose, Bld: 317 mg/dL — ABNORMAL HIGH (ref 70–99)
POTASSIUM: 4.5 meq/L (ref 3.7–5.3)
Sodium: 138 mEq/L (ref 137–147)

## 2013-05-21 MED ORDER — INSULIN ASPART 100 UNIT/ML ~~LOC~~ SOLN
10.0000 [IU] | Freq: Once | SUBCUTANEOUS | Status: AC
  Administered 2013-05-21: 10 [IU] via SUBCUTANEOUS

## 2013-05-21 MED ORDER — HYDROCODONE-ACETAMINOPHEN 5-325 MG PO TABS
1.0000 | ORAL_TABLET | Freq: Four times a day (QID) | ORAL | Status: DC | PRN
  Administered 2013-05-21: 1 via ORAL

## 2013-05-21 NOTE — Progress Notes (Signed)
Inpatient Diabetes Program Recommendations  AACE/ADA: New Consensus Statement on Inpatient Glycemic Control (2013)  Target Ranges:  Prepandial:   less than 140 mg/dL      Peak postprandial:   less than 180 mg/dL (1-2 hours)      Critically ill patients:  140 - 180 mg/dL   Reason for Visit: Hypoglycemia  Diabetes history: DM1 Outpatient Diabetes medications: Lantus 25 units QAM, Novolog s/s Current orders for Inpatient glycemic control: Lantus 18 units QHS, Novolog 3 units tidwc + sensitive tidwc and hs  Results for Mandy, [name] (MRN [id number]) as of 05/21/2013 09:47  Ref. Range 05/20/2013 21:25 05/20/2013 23:03 05/21/2013 00:17 05/21/2013 08:04 05/21/2013 08:21  Glucose-Capillary Latest Range: 70-99 mg/dL 161 (H) 186 (H) 177 (H) 27 (LL) 83   Results for Mandy, [name] (MRN [id number]) as of 05/21/2013 09:47  Ref. Range 05/18/2013 16:00  Hemoglobin A1C Latest Range: <5.7 % 11.8 (H)     Results for Mandy, [name] (MRN [id number]) as of 05/21/2013 09:47  Ref. Range 05/21/2013 04:45  Sodium Latest Range: 137-147 mmol/L 138  Potassium Latest Range: 3.7-5.3 mEq/L 4.5  Chloride Latest Range: 96-112 mEq/L 103  CO2 Latest Range: 19-32 mEq/L 20  BUN Latest Range: 4-21 mg/dL 3 (L)  Creatinine Latest Range: 0.50-1.10 mg/dL 0.53  Calcium Latest Range: 8.4-10.5 mg/dL 9.3  GFR calc non Af Amer Latest Range: >90 mL/min >90  GFR calc Af Amer Latest Range: >90 mL/min >90  Glucose No range found 317 (H)   AG is 15. Had hypoglycemia this am after being transitioned off GlucoStabilizer, received Lantus 18 units and Novolog 10 units for correction. ?po intake. Will follow closely while inpatient.  Thank you. Lorenda Peck, RD, LDN, CDE Inpatient Diabetes Coordinator [phone]

## 2013-05-21 NOTE — Discharge Summary (Signed)
Physician Discharge Summary  [name] HKV:[id number] DOB: 16-Aug-1980 DOA: 05/18/2013  PCP: [name], MD  Admit date: 05/18/2013 Discharge date: 05/21/2013  Recommendations for Outpatient Follow-up:  1. Close f/u with endocrinologist recommended.  Discharge Diagnoses:  Principal Problem:    DKA, type 1 Active Problems:    Hyponatremia    DM (diabetes mellitus), type 1, uncontrolled    Hypokalemia   Discharge Condition: Improved.  Diet recommendation: Carb-modified.  History of present illness:  [name] is an 33 y.o. female with PMH of type 1 diabetes diagnosed at age 7, frequent episodes of DKA with last hospitalization 01/28/13-02/01/13 who was admitted with recurrent DKA on 05/18/13.  Hospital Course by problem:  Principal Problem:  DKA, type 1  Aggressively hydrated with a 2 L normal saline bolus followed by normal saline at 200 cc an hour. When blood glucoses fell to less than 250, dextrose was added to her IV fluids. Transitioned to basal/bolus insulin 05/20/13 after anion gap closed. Metabolic acidosis resolved. CBGs 89-169.  Active Problems:  Hypokalemia  Corrected with supplementation.  Hyponatremia  Resolved with correction of blood glucoses.  DM (diabetes mellitus), type 1, uncontrolled  Hemoglobin A1c is 11.8% corresponding to a mean plasma glucose of 292. Diabetes coordinator following the patient. Recommend close follow up with endocrinologist.  Procedures:  None.  Consultations:  Diabetes coodinator  Discharge Exam: Filed Vitals:   05/21/13 0542  BP: 123/83  Pulse: 86  Temp: 98 F (36.7 C)  Resp: 16   Filed Vitals:   05/20/13 2049 05/21/13 0000 05/21/13 0255 05/21/13 0542  BP: 113/69 106/81 116/79 123/83  Pulse: 77 80 75 86  Temp:  98.7 F (37.1 C) 98 F (36.7 C) 98 F (36.7 C)  TempSrc:  Oral Oral Oral  Resp: 13  16 16   Height:   5\' 4"  (1.626 m)   Weight:   62.596 kg (138 lb)   SpO2: 100%  95% 98%    Gen:   NAD Cardiovascular:  RRR, No M/R/G Respiratory: Lungs CTAB Gastrointestinal: Abdomen soft, NT/ND with normal active bowel sounds. Extremities: No C/E/C   Discharge Instructions      Discharge Orders   Future Orders Complete By Expires   Activity as tolerated - No restrictions  As directed    Call MD for:  severe uncontrolled pain  As directed    Call MD for:  As directed    Scheduling Instructions:     Elevated blood glucoses > 250, low blood glucoses < 70, excessive thirst or urination.   Diet Carb Modified  As directed    Discharge instructions  As directed    Comments:     Check your blood sugars frequently (at a minimum of 4 times a day) and keep a log of your readings.  Notify Dr. Renne Crigler of persistently high blood glucoses > 250.  Notify her of any low blood glucose readings of < 70.    You take pain medications chronically, and must return to your prescribing provider to get refills on these medications.  For your safety, and to provide you with the highest standard of care, [facility] has a policy that no refills on chronic pain medications will be provided (unless it is after hours or on a weekend, in which case you will be provided with a prescription for a limited amount of medication to cover your needs until your doctor's office re-opens), as it is imperative that you receive your refills from the doctor who  prescribes these medications to you.  Prescriptions from multiple providers can compromise your safety and contribute to problems with increased tolerance, addiction or unintentional overdosages.  Please contact your PCP office upon discharge if you need refills on your pain medications.  If you are out of your pain medications, contact your usual prescriber as soon as possible so that you can get your medicine refilled.  Make sure you call during regular office hours, as most prescribers do not write prescriptions for pain medicines after hours or on weekends.  We will  NOT prescribe chronic pain medications to anyone who is not under the care of a PCP, as we feel these medications must be monitored closely for your safety.       Medication List         cloNIDine 0.1 MG tablet  Commonly known as:  CATAPRES  Take 0.1 mg by mouth 2 (two) times daily.     gabapentin 100 MG capsule  Commonly known as:  NEURONTIN  Take 200 mg by mouth 3 (three) times daily.     GLUCAGON EMERGENCY 1 MG injection  Generic drug:  glucagon  Inject 1 mg into the muscle once as needed (severe hypoglycemia).     HYDROcodone-acetaminophen 5-325 MG per tablet  Commonly known as:  NORCO/VICODIN  Take 1 tablet by mouth every 6 (six) hours as needed for moderate pain.     insulin aspart 100 UNIT/ML injection  Commonly known as:  novoLOG  - Inject 1-3 Units into the skin 3 (three) times daily with meals. CBG < 70: implement hypoglycemia protocol  - CBG > 400: call MD   -   - CBG 70 - 120: 0 units  - CBG 121 - 150: 3 units  - CBG 151 - 200: 4 units  - CBG 201 - 250: 7 units  - CBG 251 - 300: 11 units  - CBG 301 - 350: 15 units  - CBG 351 - 400: 20 units  - CBG > 400: call MD     insulin glargine 100 UNIT/ML injection  Commonly known as:  LANTUS  Inject 20 Units into the skin every morning.     multivitamin with minerals Tabs tablet  Take 1 tablet by mouth every morning. Diabetic support vitamin     ondansetron 8 MG disintegrating tablet  Commonly known as:  ZOFRAN-ODT  Take 8 mg by mouth every 8 (eight) hours as needed for nausea or vomiting.          The results of significant diagnostics from this hospitalization (including imaging, microbiology, ancillary and laboratory) are listed below for reference.    Significant Diagnostic Studies: Dg Chest Portable 1 View  05/18/2013   CLINICAL DATA:  Shortness of breath  EXAM: PORTABLE CHEST - 1 VIEW  COMPARISON:  DG CHEST 1V PORT dated 01/28/2013  FINDINGS: The lungs are adequately inflated. There is no  focal infiltrate. The cardiac silhouette is normal in size. The pulmonary vascularity is not engorged. The mediastinum is normal in width. There is no pleural effusion. 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  IMPRESSION: There is no evidence of pneumonia or other active cardiopulmonary disease.   Electronically Signed   By: David  Martinique   On: 05/18/2013 16:23    Labs:  Basic Metabolic Panel:  Recent Labs Lab 05/20/13 0323 05/20/13 0746 05/20/13 1300 05/20/13 1847 05/21/13 0445  NA 133* 133* 137 139 138  K 3.8 3.3* 4.6  3.9 4.5  CL 102 98 104 104 103  CO2 20 19 18* 21 20  GLUCOSE 190* 262* 187* 180* 317*  BUN 3* 3* 3* 3* 3*  CREATININE 0.48* 0.49* 0.60 0.54 0.53  CALCIUM 8.5 8.5 8.5 9.1 9.3   GFR Estimated Creatinine Clearance: 86.4 ml/min (by C-G formula based on Cr of 0.53). Liver Function Tests:  Recent Labs Lab 05/18/13 1600  AST 218*  ALT 117*  ALKPHOS 221*  BILITOT 0.3  PROT 9.1*  ALBUMIN 4.6    Recent Labs Lab 05/18/13 1600  LIPASE 9*   CBC:  Recent Labs Lab 05/18/13 1600 05/18/13 1608 05/18/13 2031 05/19/13 0629  WBC 13.0*  --  10.9* 5.8  NEUTROABS 10.1*  --   --   --   HGB 14.8 16.7* 13.2 11.0*  HCT 42.3 49.0* 37.3 30.8*  MCV 96.1  --  94.9 91.4  PLT 298  --  230 164   CBG:  Recent Labs Lab 05/20/13 2303 05/21/13 0017 05/21/13 0804 05/21/13 0821 05/21/13 1200  GLUCAP 186* 177* 27* 83 284*   Hgb A1c  Recent Labs  05/18/13 1600  HGBA1C 11.8*   Microbiology Recent Results (from the past 240 hour(s))  MRSA PCR SCREENING     Status: None   Collection Time    05/18/13  6:46 PM      Result Value Ref Range Status   MRSA by PCR NEGATIVE  NEGATIVE Final   Comment:            The GeneXpert MRSA Assay (FDA     approved for NASAL specimens     only), is one component of a     comprehensive MRSA colonization     surveillance program. It is not     intended to diagnose  MRSA     infection nor to guide or     monitor treatment for     MRSA infections.    Time coordinating discharge: 35 minutes.  Signed:  Dalicia Kisner  Pager [phone] [facility] 05/21/2013, 12:31 PM

## 2013-05-21 NOTE — Discharge Instructions (Signed)
Type 1 Diabetes Mellitus, Adult Type 1 diabetes mellitus, often simply referred to as diabetes, is a long-term (chronic) disease. It occurs when the islet cells in the pancreas that make insulin (a hormone) are destroyed and can no longer make insulin. Insulin is needed to move sugars from food into the tissue cells. The tissue cells use the sugars for energy. In people with type 1 diabetes, the sugars build up in the blood instead of going into the tissue cells. As a result, high blood sugar (hyperglycemia) develops. Without insulin, the body breaks down fat cells for the needed energy. This breakdown of fat cells produces acid chemicals (ketones), which increases the acid levels in the body. The effect of either high ketone or sugar (glucose) levels can be life-threatening.  Type 1 diabetes was also previously called juvenile diabetes. It most often occurs before the age of 30, but it can occur at any age. RISK FACTORS A person is predisposed to developing type 1 diabetes if someone in his or her family has the disease and is exposed to certain additional environmental triggers.  SYMPTOMS  Symptoms of type 1 diabetes may develop gradually over days to weeks or suddenly. The symptoms occur due to hyperglycemia. The symptoms can include:   Increased thirst (polydipsia).  Increased urination (polyuria).  Increased urination during the night (nocturia).  Weight loss. This weight loss may be rapid.  Frequent, recurring infections.  Tiredness (fatigue).  Weakness.  Vision changes, such as blurred vision.  Fruity smell to your breath.  Abdominal pain.  Nausea or vomiting. DIAGNOSIS  Type 1 diabetes is diagnosed when symptoms of diabetes are present and when blood glucose levels are increased. Your blood glucose level may be checked by one or more of the following blood tests:  A fasting blood glucose test. You will not be allowed to eat for at least 8 hours before a blood sample is  taken.  A random blood glucose test. Your blood glucose is checked at any time of the day regardless of when you ate.  A hemoglobin A1c blood glucose test. A hemoglobin A1c test provides information about blood glucose control over the previous 3 months. TREATMENT  Although type 1 diabetes cannot be prevented, it can be managed with insulin, diet, and exercise.  You will need to take insulin daily to keep blood glucose in the desired range.  You will need to match insulin dosing with exercise and healthy food choices. The treatment goal is to maintain the before-meal blood sugar (preprandial glucose) level at 70 130 mg/dL.  HOME CARE INSTRUCTIONS   Have your hemoglobin A1c level checked twice a year.  Perform daily blood glucose monitoring as directed by your caregiver.  Monitor urine ketones when you are ill and as directed by your caregiver.  Take your insulin as directed by your caregiver to maintain your blood glucose level in the desired range.  Never run out of insulin. It is needed every day.  Adjust insulin based on your intake of carbohydrates. Carbohydrates can raise blood glucose levels but need to be included in your diet. Carbohydrates provide vitamins, minerals, and fiber, which are an essential part of a healthy diet. Carbohydrates are found in fruits, vegetables, whole grains, dairy products, legumes, and foods containing added sugars.    Eat healthy foods. Alternate 3 meals with 3 snacks.  Maintain a healthy weight.  Carry a medical alert card or wear your medical alert jewelry.  Carry a 15 gram carbohydrate snack with you   at all times to treat low blood glucose (hypoglycemia). Some examples of 15 gram carbohydrate snacks include:  Glucose tablets, 3 or 4.   Glucose gel, 15 gram tube.  Raisins, 2 tablespoons (24 grams).  Jelly beans, 6.  Animal crackers, 8.  Fruit juice, regular soda, or low-fat milk, 4 ounces (120 mL).  Gummy treats,  9.    Recognize hypoglycemia. Hypoglycemia occurs with blood glucose levels of 70 mg/dL and below. The risk for hypoglycemia increases when fasting or skipping meals, during or after intense exercise, and during sleep. Hypoglycemia symptoms can include:  Tremors or shakes.  Decreased ability to concentrate.  Sweating.  Increased heart rate.  Headache.  Dry mouth.  Hunger.  Irritability.  Anxiety.  Restless sleep.  Altered speech or coordination.  Confusion.  Treat hypoglycemia promptly. If you are alert and able to safely swallow, follow the 15:15 rule:  Take 15 20 grams of rapid-acting glucose or carbohydrate. Rapid-acting options include glucose gel, glucose tablets, or 4 ounces (120 mL) of fruit juice, regular soda, or low-fat milk.  Check your blood glucose level 15 minutes after taking the glucose.   Take 15 20 grams more of glucose if the repeat blood glucose level is still 70 mg/dL or below.  Eat a meal or snack within 1 hour once blood glucose levels return to normal.  Be alert to polyuria and polydipsia, which are early signs of hyperglycemia. An early awareness of hyperglycemia allows for prompt treatment. Treat hyperglycemia as directed by your caregiver.  Engage in at least 150 minutes of moderate-intensity physical activity a week, spread over at least 3 days of the week or as directed by your caregiver.  Adjust your insulin dosing and food intake as needed if you start a new exercise or sport.  Follow your sick day plan at any time you are unable to eat or drink as usual.   Avoid tobacco use.  Limit alcohol intake to no more than 1 drink per day for nonpregnant women and 2 drinks per day for men. You should drink alcohol only when you are also eating food. Talk with your caregiver about whether alcohol is safe for you. Tell your caregiver if you drink alcohol several times a week.  Follow up with your caregiver regularly.  Schedule an eye exam  within 5 years of diagnosis and then annually.  Perform daily skin and foot care. Examine your skin and feet daily for cuts, bruises, redness, nail problems, bleeding, blisters, or sores. A foot exam by a caregiver should be done annually.  Brush your teeth and gums at least twice a day and floss at least once a day. Follow up with your dentist regularly.  Share your diabetes management plan with your workplace or school.  Stay up-to-date with immunizations.  Learn to manage stress.  Obtain ongoing diabetes education and support as needed.  Participate or seek rehabilitation as needed to maintain or improve independence and quality of life. Request a physical or occupational therapy referral if you are having foot or hand numbness or difficulties with grooming, dressing, eating, or physical activity. SEEK MEDICAL CARE IF:   You are unable to eat food or drink fluids for more than 6 hours.  You have nausea and vomiting for more than 6 hours.  Your blood glucose level is over 240 mg/dL.  There is a change in mental status.  You develop an additional serious illness.  You have diarrhea for more than 6 hours.  You have been   sick or have had a fever for a couple of days and are not getting better.  You have pain during any physical activity. SEEK IMMEDIATE MEDICAL CARE IF:  You have difficulty breathing.  You have moderate to large ketone levels. MAKE SURE YOU:  Understand these instructions.  Will watch your condition.  Will get help right away if you are not doing well or get worse. Document Released: 02/06/2000 Document Revised: 11/03/2011 Document Reviewed: 09/07/2011 [employer] Patient Information 2014 [employer].  

## 2013-05-21 NOTE — Progress Notes (Signed)
cbg = 27, given po orange juice and graham cracker, breakfast ordered.  Patient states she feels shaky.  cbg repeated = 83.

## 2013-05-21 NOTE — Progress Notes (Signed)
Report called and pt transported via bed to 1503.  Pt A & O, VSS.

## 2013-10-12 ENCOUNTER — Telehealth: Payer: Self-pay

## 2013-10-12 NOTE — Telephone Encounter (Signed)
LVM for pt to call back in regards to DM and maintenance.   DM Bundle pt.

## 2013-11-02 ENCOUNTER — Encounter (HOSPITAL_COMMUNITY): Payer: Self-pay | Admitting: Emergency Medicine

## 2013-11-02 ENCOUNTER — Inpatient Hospital Stay (HOSPITAL_COMMUNITY): Payer: Medicaid Other

## 2013-11-02 ENCOUNTER — Inpatient Hospital Stay (HOSPITAL_COMMUNITY)
Admission: EM | Admit: 2013-11-02 | Discharge: 2013-11-05 | DRG: 074 | Disposition: A | Discharge status: Home / Self Care | Payer: Medicaid Other | Attending: Internal Medicine | Admitting: Internal Medicine

## 2013-11-02 DIAGNOSIS — A088 Other specified intestinal infections: Secondary | ICD-10-CM | POA: Diagnosis present

## 2013-11-02 DIAGNOSIS — I10 Essential (primary) hypertension: Secondary | ICD-10-CM | POA: Diagnosis present

## 2013-11-02 DIAGNOSIS — E876 Hypokalemia: Secondary | ICD-10-CM | POA: Diagnosis present

## 2013-11-02 DIAGNOSIS — E1010 Type 1 diabetes mellitus with ketoacidosis without coma: Secondary | ICD-10-CM

## 2013-11-02 DIAGNOSIS — K219 Gastro-esophageal reflux disease without esophagitis: Secondary | ICD-10-CM

## 2013-11-02 DIAGNOSIS — E1065 Type 1 diabetes mellitus with hyperglycemia: Secondary | ICD-10-CM | POA: Diagnosis not present

## 2013-11-02 DIAGNOSIS — Z79899 Other long term (current) drug therapy: Secondary | ICD-10-CM

## 2013-11-02 DIAGNOSIS — Z794 Long term (current) use of insulin: Secondary | ICD-10-CM

## 2013-11-02 DIAGNOSIS — K3184 Gastroparesis: Secondary | ICD-10-CM

## 2013-11-02 DIAGNOSIS — D72829 Elevated white blood cell count, unspecified: Secondary | ICD-10-CM | POA: Diagnosis present

## 2013-11-02 DIAGNOSIS — Z23 Encounter for immunization: Secondary | ICD-10-CM

## 2013-11-02 DIAGNOSIS — E1149 Type 2 diabetes mellitus with other diabetic neurological complication: Secondary | ICD-10-CM

## 2013-11-02 DIAGNOSIS — Z87891 Personal history of nicotine dependence: Secondary | ICD-10-CM | POA: Diagnosis not present

## 2013-11-02 DIAGNOSIS — F411 Generalized anxiety disorder: Secondary | ICD-10-CM | POA: Diagnosis present

## 2013-11-02 DIAGNOSIS — E1049 Type 1 diabetes mellitus with other diabetic neurological complication: Principal | ICD-10-CM | POA: Diagnosis present

## 2013-11-02 DIAGNOSIS — R109 Unspecified abdominal pain: Secondary | ICD-10-CM

## 2013-11-02 DIAGNOSIS — E860 Dehydration: Secondary | ICD-10-CM

## 2013-11-02 DIAGNOSIS — E1143 Type 2 diabetes mellitus with diabetic autonomic (poly)neuropathy: Secondary | ICD-10-CM

## 2013-11-02 DIAGNOSIS — R112 Nausea with vomiting, unspecified: Secondary | ICD-10-CM | POA: Diagnosis present

## 2013-11-02 DIAGNOSIS — B349 Viral infection, unspecified: Secondary | ICD-10-CM

## 2013-11-02 LAB — BASIC METABOLIC PANEL
ANION GAP: 17 — AB (ref 5–15)
Anion gap: 39 — ABNORMAL HIGH (ref 5–15)
Anion gap: 30 — ABNORMAL HIGH (ref 5–15)
BUN: 17 mg/dL (ref 6–23)
BUN: 17 mg/dL (ref 6–23)
BUN: 16 mg/dL (ref 6–23)
BUN: 16 mg/dL (ref 6–23)
BUN: 16 mg/dL (ref 6–23)
CALCIUM: 8.3 mg/dL — AB (ref 8.4–10.5)
CALCIUM: 7.1 mg/dL — AB (ref 8.4–10.5)
CO2: <7 mEq/L — CL (ref 19–32)
CO2: 8 meq/L — AB (ref 19–32)
CO2: 12 meq/L — AB (ref 19–32)
CO2: 25 meq/L (ref 19–32)
CREATININE: 0.93 mg/dL (ref 0.50–1.10)
CREATININE: 0.95 mg/dL (ref 0.50–1.10)
CREATININE: 0.86 mg/dL (ref 0.50–1.10)
Calcium: 9.2 mg/dL (ref 8.4–10.5)
Calcium: 8.7 mg/dL (ref 8.4–10.5)
Calcium: 8.5 mg/dL (ref 8.4–10.5)
Chloride: 88 mEq/L — ABNORMAL LOW (ref 96–112)
Chloride: 91 mEq/L — ABNORMAL LOW (ref 96–112)
Chloride: 91 mEq/L — ABNORMAL LOW (ref 96–112)
Chloride: 94 mEq/L — ABNORMAL LOW (ref 96–112)
Chloride: 91 mEq/L — ABNORMAL LOW (ref 96–112)
Creatinine, Ser: 0.8 mg/dL (ref 0.50–1.10)
Creatinine, Ser: 0.6 mg/dL (ref 0.50–1.10)
GFR calc Af Amer: >90 mL/min (ref >90)
GFR calc Af Amer: >90 mL/min (ref >90)
GFR calc non Af Amer: 78 mL/min — ABNORMAL LOW (ref >90)
GFR calc non Af Amer: 88 mL/min — ABNORMAL LOW (ref >90)
GFR calc non Af Amer: >90 mL/min (ref >90)
GFR calc non Af Amer: >90 mL/min (ref >90)
GFR, EST NON AFRICAN AMERICAN: 80 mL/min — AB (ref >90)
Glucose, Bld: 510 mg/dL — ABNORMAL HIGH (ref 70–99)
Glucose, Bld: 341 mg/dL — ABNORMAL HIGH (ref 70–99)
Glucose, Bld: 256 mg/dL — ABNORMAL HIGH (ref 70–99)
Glucose, Bld: 194 mg/dL — ABNORMAL HIGH (ref 70–99)
Glucose, Bld: 169 mg/dL — ABNORMAL HIGH (ref 70–99)
Potassium: 5.2 mEq/L (ref 3.7–5.3)
Potassium: 4.4 mEq/L (ref 3.7–5.3)
Potassium: 4.9 mEq/L (ref 3.7–5.3)
Potassium: 4.3 mEq/L (ref 3.7–5.3)
Potassium: 3.7 mEq/L (ref 3.7–5.3)
SODIUM: 133 meq/L — AB (ref 137–147)
Sodium: 135 mEq/L — ABNORMAL LOW (ref 137–147)
Sodium: 141 mEq/L (ref 137–147)
Sodium: 138 mEq/L (ref 137–147)
Sodium: 136 mEq/L — ABNORMAL LOW (ref 137–147)

## 2013-11-02 LAB — GLUCOSE, CAPILLARY
GLUCOSE-CAPILLARY: 209 mg/dL — AB (ref 70–99)
GLUCOSE-CAPILLARY: 195 mg/dL — AB (ref 70–99)
GLUCOSE-CAPILLARY: 181 mg/dL — AB (ref 70–99)
GLUCOSE-CAPILLARY: 117 mg/dL — AB (ref 70–99)
GLUCOSE-CAPILLARY: 196 mg/dL — AB (ref 70–99)
GLUCOSE-CAPILLARY: 204 mg/dL — AB (ref 70–99)
GLUCOSE-CAPILLARY: 190 mg/dL — AB (ref 70–99)
GLUCOSE-CAPILLARY: 139 mg/dL — AB (ref 70–99)
Glucose-Capillary: >600 mg/dL (ref 70–99)
Glucose-Capillary: 588 mg/dL (ref 70–99)
Glucose-Capillary: 460 mg/dL — ABNORMAL HIGH (ref 70–99)
Glucose-Capillary: 349 mg/dL — ABNORMAL HIGH (ref 70–99)
Glucose-Capillary: 279 mg/dL — ABNORMAL HIGH (ref 70–99)
Glucose-Capillary: 211 mg/dL — ABNORMAL HIGH (ref 70–99)
Glucose-Capillary: 90 mg/dL (ref 70–99)
Glucose-Capillary: 106 mg/dL — ABNORMAL HIGH (ref 70–99)
Glucose-Capillary: 154 mg/dL — ABNORMAL HIGH (ref 70–99)
Glucose-Capillary: 147 mg/dL — ABNORMAL HIGH (ref 70–99)
Glucose-Capillary: 203 mg/dL — ABNORMAL HIGH (ref 70–99)
Glucose-Capillary: 38 mg/dL — CL (ref 70–99)

## 2013-11-02 LAB — I-STAT CHEM 8, ED
BUN: 18 mg/dL (ref 6–23)
CALCIUM ION: 1.2 mmol/L (ref 1.12–1.23)
CREATININE: 0.9 mg/dL (ref 0.50–1.10)
Chloride: 99 mEq/L (ref 96–112)
Glucose, Bld: >700 mg/dL (ref 70–99)
HEMATOCRIT: 52 % — AB (ref 36.0–46.0)
Hemoglobin: 17.7 g/dL — ABNORMAL HIGH (ref 12.0–15.0)
Potassium: 5.4 mEq/L — ABNORMAL HIGH (ref 3.7–5.3)
Sodium: 129 mEq/L — ABNORMAL LOW (ref 137–147)
TCO2: 6 mmol/L (ref 0–100)

## 2013-11-02 LAB — LACTIC ACID, PLASMA: Lactic Acid, Venous: 4.8 mmol/L — ABNORMAL HIGH (ref 0.5–2.2)

## 2013-11-02 LAB — COMPREHENSIVE METABOLIC PANEL
ALK PHOS: 149 U/L — AB (ref 39–117)
ALT: 21 U/L (ref 0–35)
AST: 36 U/L (ref 0–37)
Albumin: 4.9 g/dL (ref 3.5–5.2)
BUN: 19 mg/dL (ref 6–23)
CHLORIDE: 78 meq/L — AB (ref 96–112)
CO2: <7 mEq/L — CL (ref 19–32)
Calcium: 10.3 mg/dL (ref 8.4–10.5)
Creatinine, Ser: 0.98 mg/dL (ref 0.50–1.10)
GFR calc non Af Amer: 75 mL/min — ABNORMAL LOW (ref >90)
GFR, EST AFRICAN AMERICAN: 87 mL/min — AB (ref >90)
GLUCOSE: 703 mg/dL — AB (ref 70–99)
POTASSIUM: 5.7 meq/L — AB (ref 3.7–5.3)
Sodium: 131 mEq/L — ABNORMAL LOW (ref 137–147)
Total Bilirubin: 0.3 mg/dL (ref 0.3–1.2)
Total Protein: 9.4 g/dL — ABNORMAL HIGH (ref 6.0–8.3)

## 2013-11-02 LAB — CBG MONITORING, ED

## 2013-11-02 LAB — BLOOD GAS, ARTERIAL
ACID-BASE DEFICIT: 11.3 mmol/L — AB (ref 0.0–2.0)
BICARBONATE: 12.2 meq/L — AB (ref 20.0–24.0)
DRAWN BY: 42253
O2 CONTENT: 2 L/min
O2 SAT: 99 %
PCO2 ART: 22.3 mmHg — AB (ref 35.0–45.0)
Patient temperature: 99
TCO2: 11.1 mmol/L (ref 0–100)
pH, Arterial: 7.361 (ref 7.350–7.450)
pO2, Arterial: 129 mmHg — ABNORMAL HIGH (ref 80.0–100.0)

## 2013-11-02 LAB — BLOOD GAS, VENOUS
ACID-BASE DEFICIT: 29.3 mmol/L — AB (ref 0.0–2.0)
Bicarbonate: 5.1 mEq/L — ABNORMAL LOW (ref 20.0–24.0)
O2 SAT: 56.6 %
PO2 VEN: 45.5 mmHg — AB (ref 30.0–45.0)
Patient temperature: 98.6
TCO2: 5.3 mmol/L (ref 0–100)
pCO2, Ven: 27.7 mmHg — ABNORMAL LOW (ref 45.0–50.0)
pH, Ven: 6.898 — CL (ref 7.250–7.300)

## 2013-11-02 LAB — URINALYSIS, ROUTINE W REFLEX MICROSCOPIC
Bilirubin Urine: NEGATIVE
Glucose, UA: >1000 mg/dL — AB
Leukocytes, UA: NEGATIVE
NITRITE: NEGATIVE
Protein, ur: 30 mg/dL — AB
Specific Gravity, Urine: 1.023 (ref 1.005–1.030)
UROBILINOGEN UA: 0.2 mg/dL (ref 0.0–1.0)
pH: 5 (ref 5.0–8.0)

## 2013-11-02 LAB — CBC
HCT: 44.8 % (ref 36.0–46.0)
HEMOGLOBIN: 14.8 g/dL (ref 12.0–15.0)
MCH: 33.6 pg (ref 26.0–34.0)
MCHC: 33 g/dL (ref 30.0–36.0)
MCV: 101.6 fL — ABNORMAL HIGH (ref 78.0–100.0)
Platelets: 408 10*3/uL — ABNORMAL HIGH (ref 150–400)
RBC: 4.41 MIL/uL (ref 3.87–5.11)
RDW: 12.9 % (ref 11.5–15.5)
WBC: 27.6 10*3/uL — AB (ref 4.0–10.5)

## 2013-11-02 LAB — HEMOGLOBIN A1C
Hgb A1c MFr Bld: 12.5 % — ABNORMAL HIGH (ref <5.7)
MEAN PLASMA GLUCOSE: 312 mg/dL — AB (ref <117)

## 2013-11-02 LAB — KETONES, QUALITATIVE

## 2013-11-02 LAB — MRSA PCR SCREENING: MRSA by PCR: NEGATIVE

## 2013-11-02 LAB — URINE MICROSCOPIC-ADD ON

## 2013-11-02 MED ORDER — SODIUM BICARBONATE 8.4 % IV SOLN
100.0000 meq | Freq: Once | INTRAVENOUS | Status: AC
  Administered 2013-11-02: 100 meq via INTRAVENOUS
  Filled 2013-11-02: qty 50

## 2013-11-02 MED ORDER — ONDANSETRON 8 MG/NS 50 ML IVPB
8.0000 mg | Freq: Three times a day (TID) | INTRAVENOUS | Status: DC | PRN
  Administered 2013-11-02: 8 mg via INTRAVENOUS
  Filled 2013-11-02 (×3): qty 8

## 2013-11-02 MED ORDER — SODIUM BICARBONATE 8.4 % IV SOLN: 50.0000 meq | Freq: Once | INTRAVENOUS | Status: DC

## 2013-11-02 MED ORDER — DEXTROSE-NACL 5-0.45 % IV SOLN
INTRAVENOUS | Status: AC
  Administered 2013-11-02: 08:00:00 via INTRAVENOUS

## 2013-11-02 MED ORDER — MORPHINE SULFATE 2 MG/ML IJ SOLN
2.0000 mg | INTRAMUSCULAR | Status: DC | PRN
  Filled 2013-11-02: qty 1

## 2013-11-02 MED ORDER — INSULIN REGULAR BOLUS VIA INFUSION
0.0000 [IU] | Freq: Three times a day (TID) | INTRAVENOUS | Status: DC
  Filled 2013-11-02: qty 10

## 2013-11-02 MED ORDER — DEXTROSE 50 % IV SOLN: 25.0000 mL | INTRAVENOUS | Status: DC | PRN

## 2013-11-02 MED ORDER — SODIUM CHLORIDE 0.9 % IV SOLN
INTRAVENOUS | Status: DC
  Administered 2013-11-02: 03:00:00 via INTRAVENOUS

## 2013-11-02 MED ORDER — DEXTROSE-NACL 5-0.45 % IV SOLN: INTRAVENOUS | Status: DC

## 2013-11-02 MED ORDER — INFLUENZA VAC SPLIT QUAD 0.5 ML IM SUSY
0.5000 mL | PREFILLED_SYRINGE | INTRAMUSCULAR | Status: AC
  Administered 2013-11-03: 0.5 mL via INTRAMUSCULAR
  Filled 2013-11-02 (×2): qty 0.5

## 2013-11-02 MED ORDER — SODIUM CHLORIDE 0.9 % IV SOLN
INTRAVENOUS | Status: DC
  Filled 2013-11-02: qty 1000

## 2013-11-02 MED ORDER — SODIUM CHLORIDE 0.9 % IV BOLUS (SEPSIS)
1000.0000 mL | Freq: Once | INTRAVENOUS | Status: AC
  Administered 2013-11-02: 1000 mL via INTRAVENOUS

## 2013-11-02 MED ORDER — SODIUM CHLORIDE 0.9 % IV SOLN: INTRAVENOUS | Status: AC

## 2013-11-02 MED ORDER — ONDANSETRON HCL 4 MG/2ML IJ SOLN
4.0000 mg | Freq: Once | INTRAMUSCULAR | Status: AC
  Administered 2013-11-02: 4 mg via INTRAVENOUS
  Filled 2013-11-02: qty 2

## 2013-11-02 MED ORDER — FENTANYL CITRATE 0.05 MG/ML IJ SOLN
25.0000 ug | INTRAMUSCULAR | Status: DC | PRN
  Administered 2013-11-02 (×2): 25 ug via INTRAVENOUS
  Administered 2013-11-02 (×2): 50 ug via INTRAVENOUS
  Administered 2013-11-02: 25 ug via INTRAVENOUS
  Administered 2013-11-03 (×2): 50 ug via INTRAVENOUS
  Administered 2013-11-03 (×5): 25 ug via INTRAVENOUS
  Administered 2013-11-04 (×2): 50 ug via INTRAVENOUS
  Filled 2013-11-02 (×13): qty 2

## 2013-11-02 MED ORDER — FENTANYL CITRATE 0.05 MG/ML IJ SOLN
INTRAMUSCULAR | Status: AC
  Filled 2013-11-02: qty 2

## 2013-11-02 MED ORDER — SODIUM CHLORIDE 0.9 % IV SOLN
INTRAVENOUS | Status: DC
  Administered 2013-11-02: 5.4 [IU]/h via INTRAVENOUS
  Filled 2013-11-02: qty 2.5

## 2013-11-02 MED ORDER — OXYCODONE HCL 5 MG PO TABS
5.0000 mg | ORAL_TABLET | ORAL | Status: DC | PRN
  Administered 2013-11-02 – 2013-11-05 (×17): 5 mg via ORAL
  Filled 2013-11-02 (×17): qty 1

## 2013-11-02 MED ORDER — SODIUM CHLORIDE 0.9 % IV SOLN
INTRAVENOUS | Status: DC
  Administered 2013-11-02: 02:00:00 via INTRAVENOUS

## 2013-11-02 MED ORDER — FAMOTIDINE IN NACL 20-0.9 MG/50ML-% IV SOLN
20.0000 mg | Freq: Two times a day (BID) | INTRAVENOUS | Status: DC
  Administered 2013-11-02 – 2013-11-05 (×8): 20 mg via INTRAVENOUS
  Filled 2013-11-02 (×9): qty 50

## 2013-11-02 MED ORDER — ENOXAPARIN SODIUM 40 MG/0.4ML ~~LOC~~ SOLN
40.0000 mg | SUBCUTANEOUS | Status: DC
  Administered 2013-11-02 – 2013-11-05 (×4): 40 mg via SUBCUTANEOUS
  Filled 2013-11-02 (×5): qty 0.4

## 2013-11-02 MED ORDER — KCL IN DEXTROSE-NACL 20-5-0.45 MEQ/L-%-% IV SOLN
INTRAVENOUS | Status: DC
  Administered 2013-11-02: 11:00:00 via INTRAVENOUS
  Filled 2013-11-02 (×2): qty 1000

## 2013-11-02 MED ORDER — INSULIN GLARGINE 100 UNIT/ML ~~LOC~~ SOLN
22.0000 [IU] | Freq: Every day | SUBCUTANEOUS | Status: DC
  Administered 2013-11-02 – 2013-11-03 (×2): 22 [IU] via SUBCUTANEOUS
  Filled 2013-11-02 (×3): qty 0.22

## 2013-11-02 MED ORDER — SODIUM CHLORIDE 0.9 % IJ SOLN
10.0000 mL | Freq: Two times a day (BID) | INTRAMUSCULAR | Status: DC
  Administered 2013-11-02: 20 mL
  Administered 2013-11-03 (×3): 10 mL

## 2013-11-02 MED ORDER — INSULIN ASPART 100 UNIT/ML ~~LOC~~ SOLN
0.0000 [IU] | Freq: Three times a day (TID) | SUBCUTANEOUS | Status: DC
  Administered 2013-11-02: 2 [IU] via SUBCUTANEOUS
  Administered 2013-11-03: 5 [IU] via SUBCUTANEOUS
  Administered 2013-11-03: 8 [IU] via SUBCUTANEOUS
  Administered 2013-11-04: 5 [IU] via SUBCUTANEOUS
  Administered 2013-11-04: 8 [IU] via SUBCUTANEOUS
  Administered 2013-11-05: 5 [IU] via SUBCUTANEOUS
  Administered 2013-11-05: 8 [IU] via SUBCUTANEOUS

## 2013-11-02 MED ORDER — FENTANYL CITRATE 0.05 MG/ML IJ SOLN
100.0000 ug | Freq: Once | INTRAMUSCULAR | Status: AC
  Administered 2013-11-02: 100 ug via INTRAVENOUS
  Filled 2013-11-02: qty 2

## 2013-11-02 MED ORDER — PNEUMOCOCCAL VAC POLYVALENT 25 MCG/0.5ML IJ INJ: 0.5000 mL | INJECTION | INTRAMUSCULAR | Status: DC

## 2013-11-02 MED ORDER — INSULIN ASPART 100 UNIT/ML ~~LOC~~ SOLN
0.0000 [IU] | Freq: Every day | SUBCUTANEOUS | Status: DC
  Administered 2013-11-03: 2 [IU] via SUBCUTANEOUS
  Administered 2013-11-04: 3 [IU] via SUBCUTANEOUS

## 2013-11-02 MED ORDER — STERILE WATER FOR INJECTION IV SOLN
INTRAVENOUS | Status: DC
  Filled 2013-11-02 (×2): qty 100

## 2013-11-02 MED ORDER — SODIUM CHLORIDE 0.9 % IJ SOLN
10.0000 mL | INTRAMUSCULAR | Status: DC | PRN
  Administered 2013-11-04: 10 mL

## 2013-11-02 MED ORDER — SODIUM CHLORIDE 0.9 % IV SOLN
INTRAVENOUS | Status: DC
  Filled 2013-11-02: qty 2.5

## 2013-11-02 MED ORDER — STERILE WATER FOR INJECTION IV SOLN
INTRAVENOUS | Status: DC
  Administered 2013-11-02 (×2): via INTRAVENOUS
  Filled 2013-11-02 (×5): qty 800

## 2013-11-02 NOTE — Progress Notes (Signed)
INITIAL NUTRITION ASSESSMENT  DOCUMENTATION CODES Per approved criteria  -Not Applicable   INTERVENTION: - Diet advancement per MD - RD to continue to monitor  NUTRITION DIAGNOSIS: Inadequate oral intake related to inability to eat as evidenced by NPO.   Goal: Advance diet as tolerated to diabetic diet  Monitor:  Weights, labs, diet advancement, nausea/vomiting  Reason for Assessment: Malnutrition screening tool   33 y.o. female  Admitting Dx: DKA, type 1  ASSESSMENT: Pt with hx of type 1 DM, GERD, and gastroparesis. Admitted with c/o abdominal pain, nausea/vomiting, that started 1pm yesterday. Found to have diabetic ketoacidosis. Told ED RN that she has been hospitilized 30-36 times for DKA.  - Met with pt who reports eating 6 small meals/day at home of things like fresh fruit, vegetables, and fish - Drinks skim milk and water - Said for the past 2 days her intake has gone down to just a salad, hot wings, and skim milk - Weight down 18 pounds in the past 6 months  - Pt kept eyes closed during most of visit, appeared to be trying to rest - DM Coordinator following - C/o crampy abdominal pain, said she's on her period, and usually takes 1/2 hydrocodone for her symptoms  - Denies any nausea/vomiting today    Lab Results  Component Value Date   HGBA1C 12.5* 11/02/2013     Height: Ht Readings from Last 1 Encounters:  11/02/13 5' 4"  (1.626 m)    Weight: Wt Readings from Last 1 Encounters:  11/02/13 120 lb 2.4 oz (54.5 kg)    Ideal Body Weight: 120 lbs   % Ideal Body Weight: 100%  Wt Readings from Last 10 Encounters:  11/02/13 120 lb 2.4 oz (54.5 kg)  05/21/13 138 lb (62.596 kg)  01/31/13 143 lb 15.4 oz (65.3 kg)  12/03/12 125 lb (56.7 kg)  11/27/12 129 lb 14.4 oz (58.922 kg)  10/16/12 135 lb (61.236 kg)  09/14/12 135 lb (61.236 kg)  05/12/12 107 lb (48.535 kg)  04/29/12 117 lb 4.6 oz (53.2 kg)  04/16/12 114 lb 10.2 oz (52 kg)    Usual Body Weight: 135  lbs per pt  % Usual Body Weight: 89%  BMI:  Body mass index is 20.61 kg/(m^2).  Estimated Nutritional Needs: Kcal: 1400-1600 Protein: 65-80g Fluid: 1.4-1.6L/day   Skin: intact   Diet Order: NPO  EDUCATION NEEDS: -No education needs identified at this time   Intake/Output Summary (Last 24 hours) at 11/02/13 1011 Last data filed at 11/02/13 0818  Gross per 24 hour  Intake 1670.95 ml  Output   1300 ml  Net 370.95 ml    Last BM: 9/11  Labs:   Recent Labs Lab 11/02/13 0447 11/02/13 0635 11/02/13 0900  NA 141 138 136*  K 4.4 4.9 4.3  CL 91* 91* 94*  CO2 <7* 8* 12*  BUN 17 16 16   CREATININE 0.95 0.86 0.80  CALCIUM 8.7 8.5 8.3*  GLUCOSE 341* 256* 194*    CBG (last 3)   Recent Labs  11/02/13 0021 11/02/13 0155 11/02/13 0311  GLUCAP >600* >600* 588*    Scheduled Meds: . enoxaparin (LOVENOX) injection  40 mg Subcutaneous Q24H  . famotidine (PEPCID) IV  20 mg Intravenous Q12H  . [START ON 11/03/2013] Influenza vac split quadrivalent PF  0.5 mL Intramuscular Tomorrow-1000    Continuous Infusions: . dextrose 5 % and 0.45 % NaCl with KCl 20 mEq/L    . insulin (NOVOLIN-R) infusion 3 mL/hr at 11/02/13 0818  .  sodium bicarbonate 150 mEq in sterile water 1000 mL infusion 125 mL/hr at 11/02/13 0818    Past Medical History  Diagnosis Date  . Diabetes mellitus   . Thyroid disease     hypothyroidism associated with pregnancy  . Back pain   . Panic attack   . PONV (postoperative nausea and vomiting)   . Anxiety   . GERD (gastroesophageal reflux disease)   . DM gastroparesis     Past Surgical History  Procedure Laterality Date  . Laparoscopy      age 65  . Back surgery      age 40  . Tooth extraction    . Root canal  10/10/12    Carlis Stable MS, RD, LDN [phone] Pager [phone] Weekend/After Hours Pager

## 2013-11-02 NOTE — ED Notes (Signed)
Bed: WA09 Expected date: 11/01/13 Expected time: 11:53 PM Means of arrival: Ambulance Comments: 33 yo F  Hyperglycemia/N&V

## 2013-11-02 NOTE — ED Notes (Signed)
Pt presents with c/o nausea and vomiting that started around 1pm yesterday afternoon. Pt reports that she has been hospitilized 30-36 times for DKA. CBG 577 for EMS. VS per EMS 133/98, 114, 24R, 100% RA. EMS gave 4 zofran IM en route.

## 2013-11-02 NOTE — ED Provider Notes (Signed)
CSN: [id number]     Arrival date & time 11/02/13  0011 History   First MD Initiated Contact with Patient 11/02/13 0020     No chief complaint on file.    (Consider location/radiation/quality/duration/timing/severity/associated sxs/prior Treatment) HPI 33 year old female presents to the emergency apartment with complaint of "DKA".  Patient has history of frequent episodes of diabetic ketoacidosis.  She reports around 1 PM today she began to have vomiting.  She notes her blood sugar was elevated to 378.  She herself 4 units of insulin which did not help.  She reports that she has crampy abdominal pain and fast breathing which she normally has when she has DKA.  Patient reports that she has been compliant with her Lantus and NovoLog.  She denies any recent illnesses, no sick contacts. Past Medical History  Diagnosis Date  . Diabetes mellitus   . Thyroid disease     hypothyroidism associated with pregnancy  . Back pain   . Panic attack   . PONV (postoperative nausea and vomiting)   . Anxiety   . GERD (gastroesophageal reflux disease)   . DM gastroparesis    Past Surgical History  Procedure Laterality Date  . Laparoscopy      age 70  . Back surgery      age 84  . Tooth extraction    . Root canal  10/10/12   Family History  Problem Relation Age of Onset  . Diabetes Maternal Grandmother   . Cancer Maternal Grandfather     Small cell lung cancer  . Cancer Maternal Grandmother     Leukemia   History  Substance Use Topics  . Smoking status: Former Smoker    Quit date: 04/19/2000  . Smokeless tobacco: Former Systems developer  . Alcohol Use: No   OB History   Grav Para Term Preterm Abortions TAB SAB Ect Mult Living                 Review of Systems    See History of Present Illness; otherwise all other systems are reviewed and negative Allergies  Scallops; Morphine and related; and Latex  Home Medications   Prior to Admission medications   Medication Sig Start Date End Date  Taking? Authorizing Provider  cloNIDine (CATAPRES) 0.1 MG tablet Take 0.1 mg by mouth 2 (two) times daily.   Yes Historical Provider, MD  HYDROcodone-acetaminophen (NORCO/VICODIN) 5-325 MG per tablet Take 1 tablet by mouth every 6 (six) hours as needed for moderate pain.   Yes Historical Provider, MD  insulin aspart (NOVOLOG) 100 UNIT/ML injection Inject 1-3 Units into the skin 3 (three) times daily with meals. CBG < 70: implement hypoglycemia protocol CBG > 400: call MD   CBG 70 - 120: 0 units CBG 121 - 150: 3 units CBG 151 - 200: 4 units CBG 201 - 250: 7 units CBG 251 - 300: 11 units CBG 301 - 350: 15 units CBG 351 - 400: 20 units CBG > 400: call MD 02/01/13  Yes Donita Brooks, NP  insulin glargine (LANTUS) 100 UNIT/ML injection Inject 20 Units into the skin every morning. 02/01/13  Yes Donita Brooks, NP  Multiple Vitamin (MULTIVITAMIN WITH MINERALS) TABS Take 1 tablet by mouth every morning. Diabetic support vitamin   Yes Historical Provider, MD  ondansetron (ZOFRAN-ODT) 8 MG disintegrating tablet Take 8 mg by mouth every 8 (eight) hours as needed for nausea or vomiting.   Yes Historical Provider, MD  glucagon (GLUCAGON EMERGENCY) 1 MG injection Inject  1 mg into the muscle once as needed (severe hypoglycemia).    Historical Provider, MD   BP 134/86  Pulse 106  Temp(Src) 97.5 F (36.4 C) (Oral)  Resp 28  SpO2 100%  LMP 10/31/2013 Physical Exam  Nursing note and vitals reviewed. Constitutional: She is oriented to person, place, and time. She appears well-developed and well-nourished. She appears distressed (uncomfortable appearing, tachypneic).  HENT:  Head: Normocephalic and atraumatic.  Nose: Nose normal.  Mouth/Throat: Oropharynx is clear and moist.  Eyes: Conjunctivae and EOM are normal. Pupils are equal, round, and reactive to light.  Neck: Normal range of motion. Neck supple. No JVD present. No tracheal deviation present. No thyromegaly present.  Cardiovascular: Normal  rate, regular rhythm, normal heart sounds and intact distal pulses.  Exam reveals no gallop and no friction rub.   No murmur heard. Pulmonary/Chest: Breath sounds normal. No stridor. No respiratory distress. She has no wheezes. She has no rales. She exhibits no tenderness.  Abdominal: Soft. Bowel sounds are normal. She exhibits no distension and no mass. There is no tenderness. There is no rebound and no guarding.  Musculoskeletal: Normal range of motion. She exhibits no edema and no tenderness.  Lymphadenopathy:    She has no cervical adenopathy.  Neurological: She is alert and oriented to person, place, and time. She displays normal reflexes. She exhibits normal muscle tone. Coordination normal.  Skin: Skin is warm and dry. No rash noted. No erythema. No pallor.  Psychiatric: She has a normal mood and affect. Her behavior is normal. Judgment and thought content normal.    ED Course  Procedures (including critical care time) Labs Review Labs Reviewed  CBC - Abnormal; Notable for the following:    WBC 27.6 (*)    MCV 101.6 (*)    Platelets 408 (*)    All other components within normal limits  COMPREHENSIVE METABOLIC PANEL - Abnormal; Notable for the following:    Sodium 131 (*)    Potassium 5.7 (*)    Chloride 78 (*)    CO2 <7 (*)    Glucose, Bld 703 (*)    Total Protein 9.4 (*)    Alkaline Phosphatase 149 (*)    GFR calc non Af Amer 75 (*)    GFR calc Af Amer 87 (*)    All other components within normal limits  URINALYSIS, ROUTINE W REFLEX MICROSCOPIC - Abnormal; Notable for the following:    Glucose, UA >1000 (*)    Hgb urine dipstick TRACE (*)    Ketones, ur >80 (*)    Protein, ur 30 (*)    All other components within normal limits  BLOOD GAS, VENOUS - Abnormal; Notable for the following:    pH, Ven 6.898 (*)    pCO2, Ven 27.7 (*)    pO2, Ven 45.5 (*)    Bicarbonate 5.1 (*)    Acid-base deficit 29.3 (*)    All other components within normal limits  URINE  MICROSCOPIC-ADD ON - Abnormal; Notable for the following:    Bacteria, UA FEW (*)    All other components within normal limits  CBG MONITORING, ED - Abnormal; Notable for the following:    Glucose-Capillary >600 (*)    All other components within normal limits  I-STAT CHEM 8, ED - Abnormal; Notable for the following:    Sodium 129 (*)    Potassium 5.4 (*)    Glucose, Bld >700 (*)    Hemoglobin 17.7 (*)    HCT 52.0 (*)  All other components within normal limits  HEMOGLOBIN A1C  CBG MONITORING, ED    Imaging Review No results found.   EKG Interpretation None     CRITICAL CARE Performed by: Kalman Drape Total critical care time: 90 min Critical care time was exclusive of separately billable procedures and treating other patients. Critical care was necessary to treat or prevent imminent or life-threatening deterioration. Critical care was time spent personally by me on the following activities: development of treatment plan with patient and/or surrogate as well as nursing, discussions with consultants, evaluation of patient's response to treatment, examination of patient, obtaining history from patient or surrogate, ordering and performing treatments and interventions, ordering and review of laboratory studies, ordering and review of radiographic studies, pulse oximetry and re-evaluation of patient's condition.  MDM   Final diagnoses:  Diabetic ketoacidosis without coma associated with type 1 diabetes mellitus    33 year old female with DKA.  Patient has bicarbonate less than 7.  She is acidotic with a venous pH of 6.898.  Case was discussed with critical care, and with this time is overwhelmed with other patients and request medicine admission.  Dr. Posey Pronto with hospitalist will limit the patient.  She has been started on IV fluids and insulin drip.    Kalman Drape, MD 11/02/13 [phone]

## 2013-11-02 NOTE — Progress Notes (Addendum)
Inpatient Diabetes Program Recommendations  AACE/ADA: New Consensus Statement on Inpatient Glycemic Control (2013)  Target Ranges:  Prepandial:   less than 140 mg/dL      Peak postprandial:   less than 180 mg/dL (1-2 hours)      Critically ill patients:  140 - 180 mg/dL     Results for [name], [name] (MRN [id number]) as of 11/02/2013 07:19  Ref. Range 11/02/2013 00:41  Sodium Latest Range: 137-147 mmol/L 131 (L)  Potassium Latest Range: 3.7-5.3 mEq/L 5.7 (H)  Chloride Latest Range: 96-112 mEq/L 78 (L)  CO2 Latest Range: 19-32 mEq/L <7 (LL)  Mean Plasma Glucose Latest Range: <117 mg/dL 312 (H)  BUN Latest Range: 4-21 mg/dL 19  Creatinine Latest Range: 0.50-1.10 mg/dL 0.98  Calcium Latest Range: 8.4-10.5 mg/dL 10.3  GFR calc non Af Amer Latest Range: >90 mL/min 75 (L)  GFR calc Af Amer Latest Range: >90 mL/min 87 (L)  Glucose No range found 703 (HH)  Anion gap Latest Range: 5-15  NOT CALCULATED    Results for [name], [name] (MRN [id number]) as of 11/02/2013 07:19  Ref. Range 11/02/2013 00:41  Hemoglobin A1C Latest Range: <5.7 % 12.5 (H)     Admitted with DKA.  Patient with History of Type 1 Diabetes, Gastroparesis.    Home Insulin Regimen: Lantus 20 units daily Novolog 3-20 units tid with meals per SSI   **Patient well known to the Inpatient Diabetes Program.  Multiple admissions for DKA.   **Per chart review, note that pt was seen by her Endocrinologist (Dr. Philemon Kingdom) on 11/27/2012. Dr. Cruzita Lederer stated in her notes that pt was using an insulin pump for approximately 2 years, however, during that time, pt had 18 hospital visits for hyperglycemia/DKA. Patient came off insulin pump in early 2014 and was restarted on basal/bolus regimen with injections.   **Current A1c of 12.5% shows continued poor control at home. DM Coordinators have met with patient multiple times.   **Of note, patient has not seen her Endocrinologist (Dr. Cruzita Lederer) since October of 2014.  No notes from  Dr. Cruzita Lederer or Office visits noted with Dr. Cruzita Lederer since 2014.  **Last BMET drawn at [facility] showed CO2 still less than 7.  Note patient is receiving IVF and was started on IV insulin drip at 3:20AM    MD- When patient is ready to transition off IV insulin drip once CO2 has improved, acidosis clear, anion gap closed:  Please make sure patient gets her Home dose of Lantus 1-2 hours before IV insulin drip is stopped.   Patient will also need order for Novolog Sensitive SSI and likely Novolog Meal Coverage as well once off IV insulin drip.     Addendum 28AM: Spoke with patient this AM about the events leading up to her admission.  Patient stated she was getting her period and that her CBGs usually are "all over the place" when she has her period.  Patient stated she took her insulin as prescribed and did not miss any doses.  Asked patient if she has seen Dr. Cruzita Lederer with [facility] recently.  Patient stated she hasn't seen Dr. Cruzita Lederer since Oct 2014 and that she needs to make a follow-up appointment as soon as she is discharged.  Encouraged patient to do this and reminded patient about the importance of regular follow-up with an Endocrinologist to assist her with better blood sugar control at home.  Discussed patient's care here in the hospital.  Explained why we are giving her IVF, IV insulin  drip, checking labs frequently, etc.  Explained to patient that as soon as her labs are improved and when she shows no signs/symptoms of acidosis that the MD will transition her to SQ insulin regimen.  Patient stated she understood.   Will follow closely Wyn Quaker RN, MSN, CDE Diabetes Coordinator Inpatient Diabetes Program Team Pager: [phone] (8a-10p)

## 2013-11-02 NOTE — Progress Notes (Signed)
CARE MANAGEMENT NOTE 11/02/2013  Patient:  ORTHA, METTS A   Account Number:  [id number]  Date Initiated:  11/02/2013  Documentation initiated by:  Tysean Vandervliet  Subjective/Objective Assessment:   ppt with hx of diabetic ka admitted with abnormal abg's and glucose greater than 600. States that she is compliant with all her medication and has been using her insulin     Action/Plan:   home when stable   Anticipated DC Date:  11/05/2013   Anticipated DC Plan:  HOME/SELF CARE  In-house referral  NA      DC Planning Services  CM consult      PAC Choice  NA   Choice offered to / List presented to:  NA      DME agency  NA     Joiner arranged  NA      [facility] [place] agency  NA   Status of service:  In process, will continue to follow Medicare Important Message given?   (If response is "NO", the following Medicare IM given date fields will be blank) Date Medicare IM given:   Medicare IM given by:   Date Additional Medicare IM given:   Additional Medicare IM given by:    Discharge Disposition:    Per UR Regulation:  Reviewed for med. necessity/level of care/duration of stay  If discussed at [place] of Stay Meetings, dates discussed:    Comments:  [id number]/Camara Renstrom,RN,BSN,CCM

## 2013-11-02 NOTE — H&P (Signed)
[facility] History and Physical  Patient: [name]  WVP:[id number]  DOB: Jul 11, 1980  DOS: the patient was seen and examined on 11/02/2013 PCP: [name], MD  Chief Complaint: Abdominal pain nausea and vomiting  HPI: [name] is a 33 y.o. female with Past medical history of diabetes mellitus probably type I uncontrolled, GERD, gastroparesis. The patient presented with complaints of abdominal pain associated with nausea and vomiting ongoing since 1 pm.  The patient mentions that she has brittle diabetes with her sugars ranging from 20-300 on a regular basis. Earlier this morning her sugar over 200+ and gradually increased and at around 1 PM she started having complaints of nausea and vomiting. Therefore started having diffuse abdominal pain which he describes as crampy throbbing. She denies any fever but complains of some chills. She denies any cough, chest pain, diarrhea, burning urination. She mentions she is compliant with all her medication and has been using her insulin. She hasn't taken any of her other medications today. She denies any drug abuse alcohol abuse.  The patient is coming from home. And at her baseline independent for most of her ADL.  Review of Systems: as mentioned in the history of present illness.  A Comprehensive review of the other systems is negative.  Past Medical History  Diagnosis Date  . Diabetes mellitus   . Thyroid disease     hypothyroidism associated with pregnancy  . Back pain   . Panic attack   . PONV (postoperative nausea and vomiting)   . Anxiety   . GERD (gastroesophageal reflux disease)   . DM gastroparesis    Past Surgical History  Procedure Laterality Date  . Laparoscopy      age 46  . Back surgery      age 77  . Tooth extraction    . Root canal  10/10/12   Social History:  reports that she quit smoking about 13 years ago. She has quit using smokeless tobacco. She reports that she drinks alcohol. She reports that  she does not use illicit drugs.  Allergies  Allergen Reactions  . Scallops [Shellfish Allergy] Anaphylaxis  . Morphine And Related Itching  . Latex Rash    Family History  Problem Relation Age of Onset  . Diabetes Maternal Grandmother   . Cancer Maternal Grandfather     Small cell lung cancer  . Cancer Maternal Grandmother     Leukemia    Prior to Admission medications   Medication Sig Start Date End Date Taking? Authorizing Provider  cloNIDine (CATAPRES) 0.1 MG tablet Take 0.1 mg by mouth 2 (two) times daily.   Yes Historical Provider, MD  HYDROcodone-acetaminophen (NORCO/VICODIN) 5-325 MG per tablet Take 1 tablet by mouth every 6 (six) hours as needed for moderate pain.   Yes Historical Provider, MD  insulin aspart (NOVOLOG) 100 UNIT/ML injection Inject 1-3 Units into the skin 3 (three) times daily with meals. CBG < 70: implement hypoglycemia protocol CBG > 400: call MD   CBG 70 - 120: 0 units CBG 121 - 150: 3 units CBG 151 - 200: 4 units CBG 201 - 250: 7 units CBG 251 - 300: 11 units CBG 301 - 350: 15 units CBG 351 - 400: 20 units CBG > 400: call MD 02/01/13  Yes Donita Brooks, NP  insulin glargine (LANTUS) 100 UNIT/ML injection Inject 20 Units into the skin every morning. 02/01/13  Yes Donita Brooks, NP  Multiple Vitamin (MULTIVITAMIN WITH MINERALS) TABS Take 1 tablet by  mouth every morning. Diabetic support vitamin   Yes Historical Provider, MD  ondansetron (ZOFRAN-ODT) 8 MG disintegrating tablet Take 8 mg by mouth every 8 (eight) hours as needed for nausea or vomiting.   Yes Historical Provider, MD  glucagon (GLUCAGON EMERGENCY) 1 MG injection Inject 1 mg into the muscle once as needed (severe hypoglycemia).    Historical Provider, MD    Physical Exam: Filed Vitals:   11/02/13 0130 11/02/13 0200 11/02/13 0304 11/02/13 0315  BP: 134/85 134/86 148/109 149/103  Pulse: 112 106  122  Temp:   97.9 F (36.6 C)   TempSrc:   Axillary   Resp: 26 28 43 35  Height:   5\' 4"   (1.626 m)   Weight:   54.5 kg (120 lb 2.4 oz)   SpO2: 100% 100%  100%    General: Alert, Awake and Oriented to Time, Place and Person. Appear in mild distress Eyes: PERRL ENT: Oral Mucosa clear moist. Neck: no JVD Cardiovascular: S1 and S2 Present, no Murmur, Peripheral Pulses Present Respiratory: Bilateral Air entry equal and Decreased, Clear to Auscultation, noCrackles, no wheezes Abdomen: Bowel Sound Present, Soft and Non tender Skin: no Rash Extremities: no Pedal edema, no calf tenderness Neurologic: Grossly no focal neuro deficit.  Labs on Admission:  CBC:  Recent Labs Lab 11/02/13 0041 11/02/13 0052  WBC 27.6*  --   HGB 14.8 17.7*  HCT 44.8 52.0*  MCV 101.6*  --   PLT 408*  --     CMP     Component Value Date/Time   NA 129* 11/02/2013 0052   NA 121* 08/18/2012   K 5.4* 11/02/2013 0052   CL 99 11/02/2013 0052   CO2 <7* 11/02/2013 0041   GLUCOSE >700* 11/02/2013 0052   BUN 18 11/02/2013 0052   BUN 22* 08/18/2012   CREATININE 0.90 11/02/2013 0052   CREATININE 1.0 08/18/2012   CALCIUM 10.3 11/02/2013 0041   PROT 9.4* 11/02/2013 0041   ALBUMIN 4.9 11/02/2013 0041   AST 36 11/02/2013 0041   ALT 21 11/02/2013 0041   ALKPHOS 149* 11/02/2013 0041   BILITOT 0.3 11/02/2013 0041   GFRNONAA 75* 11/02/2013 0041   GFRAA 87* 11/02/2013 0041    No results found for this basename: LIPASE, AMYLASE,  in the last 168 hours No results found for this basename: AMMONIA,  in the last 168 hours  No results found for this basename: CKTOTAL, CKMB, CKMBINDEX, TROPONINI,  in the last 168 hours BNP (last 3 results) No results found for this basename: PROBNP,  in the last 8760 hours  Radiological Exams on Admission: No results found.  Assessment/Plan Principal Problem:   DKA, type 1 Active Problems:   Nausea and vomiting   GERD (gastroesophageal reflux disease)   Abdominal pain, unspecified site   1. DKA, type 1 The patient is presenting with complaints of DKA. She has been to diabetes  with recurrent admissions for DKA. Question of compliance. With this currently her pH is significantly low. It initially critical care was consulted recommended medicine admission. Patient will be given IV bicarbonate 100 mEq x1 for her low pH. She has received IV bolus I will give her another IV normal saline bolus followed by 124 cc of normal saline along with IV insulin. Potassium 5.7 no replacement needed at present. I would also give her IV Zofran as needed for nausea, Pepcid for GERD as well as OxyIR for moderate pain and fentanyl for severe pain. Patient required multiple doses of Dilaudid in  the past admissions for pain control. Monitor BMP every 2 hours for anion gap closing.  2. Abdominal pain. Check abdominal x-ray lactic acid  Consults: critical care  DVT Prophylaxis: subcutaneous Heparin Nutrition: npo  Code Status: full  Disposition: Admitted to inpatient in step-down unit.  Author: Berle Mull, MD [facility] Pager: [phone] 11/02/2013, 4:16 AM    If 7PM-7AM, please contact night-coverage [email or web address] Password [email or web address]  **Disclaimer: This note may have been dictated with voice recognition software. Similar sounding words can inadvertently be transcribed and this note may contain transcription errors which may not have been corrected upon publication of note.**

## 2013-11-02 NOTE — Progress Notes (Signed)
Peripherally Inserted Central Catheter/Midline Placement  The IV Nurse has discussed with the patient and/or persons authorized to consent for the patient, the purpose of this procedure and the potential benefits and risks involved with this procedure.  The benefits include less needle sticks, lab draws from the catheter and patient may be discharged home with the catheter.  Risks include, but not limited to, infection, bleeding, blood clot (thrombus formation), and puncture of an artery; nerve damage and irregular heat beat.  Alternatives to this procedure were also discussed.  PICC/Midline Placement Documentation  PICC / Midline Double Lumen 37/48/27 PICC Right Basilic 37 cm 1 cm (Active)  Indication for Insertion or Continuance of Line Poor Vasculature-patient has had multiple peripheral attempts or PIVs lasting less than 24 hours 11/02/2013 11:00 AM  Exposed Catheter (cm) 1 cm 11/02/2013 11:00 AM  Dressing Change Due 11/09/13 11/02/2013 11:00 AM       Holley Bouche Renee 11/02/2013, 11:36 AM

## 2013-11-02 NOTE — Progress Notes (Addendum)
[facility] PROGRESS NOTE  [name] LDJ:[id number] DOB: 06-Mar-1980 DOA: 11/02/2013 PCP: Mandy Jewel, MD   Brief narrative 33 year old female with uncontrolled type 1 diabetes mellitus with recurrent hospitalization for DKA, gastroparesis and GERD presented with one-day history of epigastric pain associated nausea and vomiting. Patient has uncontrolled blood glucose at home ranging in 200s to 300s. Patient was found to be in severe DKA and admitted to step down.  Assessment/Plan: Diabetes ketoacidosis Patient admitted with severe DKA. Started on glucose nebulizer protocol and aggressive IV hydration and insulin drip which is continued. She has severe metabolic acidosis with last anion gap of 30. Fingersticks improving and fluids switched to D5 half normal saline. Monitor BMET every 3-4 hrs to check for anion gap and bicarbonate level. Given 1 amp of bicarbonate on admission for low venous pH of 6.8 and bicarbonate less than 7 and subsequently started on 2 amps of bicarbonate in sterile water. ( now dced as both ph and bicarb improved) -Patient is on Lantus along with pre-meal aspart at home and has uncontrolled diabetes with A1c of 12.4. Reports to be compliant with her insulin and diet. She has not seen her endocrinologist Dr. Cruzita [name] for almost one year. Her current symptoms may have been triggered by viral gastroenteritis . -Will transition to Lantus with premeal coverage once AG closed. Emphasized on dietary and medication adherence and close outpatient followup with her PCP and endocrinologist. -Continue statin on monitoring.  Nausea vomiting and abdominal pain Possibly related to viral gastroenteritis versus triggered by DKA. Patient has abdominal pain limited to the epigastric area. Will start on clears once tolerated. Continue supportive care with fluids, Pepcid and antiemetics.  Hypertension Morning blood pressure meds and given low normal blood  pressure  Leukocytosis Possibly in the setting of acute stress. No signs of infection Monitor in a.m.  Lactic acidosis No signs of bowel obstruction or ischemia. Recheck in a.m.  DVT prophylaxis: Subcutaneous Lovenox Diet: N.p.o. . Start clear liquid once improved  Code Status: Full code Family Communication: None bedside Disposition Plan: Home once improved   Consultants:  None  Procedures:  None  Antibiotics:  None  HPI/Subjective: Admission H&P reviewed. Patient reports some nausea but has not had vomiting this morning. Reports epigastric pain.  Objective: Filed Vitals:   11/02/13 0800  BP: 95/57  Pulse: 115  Temp: 99.4 F (37.4 C)  Resp: 20    Intake/Output Summary (Last 24 hours) at 11/02/13 1012 Last data filed at 11/02/13 0818  Gross per 24 hour  Intake 1670.95 ml  Output   1300 ml  Net 370.95 ml   Filed Weights   11/02/13 0304  Weight: 54.5 kg (120 lb 2.4 oz)    Exam:   General:  Middle aged female in no acute distress  HEENT: No pallor, dry oral mucosa  Cardiovascular: S1 and S2 tachycardic, no murmurs  Respiratory: Clear to auscultation bilaterally,  Abdomen: Soft, nondistended, bowel sounds present, epigastric tenderness  Musculoskeletal: Warm, no edema  Data Reviewed: Basic Metabolic Panel:  Recent Labs Lab 11/02/13 0041 11/02/13 0052 11/02/13 0330 11/02/13 0447 11/02/13 0635 11/02/13 0900  NA 131* 129* 135* 141 138 136*  K 5.7* 5.4* 5.2 4.4 4.9 4.3  CL 78* 99 88* 91* 91* 94*  CO2 <7*  --  <7* <7* 8* 12*  GLUCOSE 703* >700* 510* 341* 256* 194*  BUN 19 18 17 17 16 16   CREATININE 0.98 0.90 0.93 0.95 0.86 0.80  CALCIUM 10.3  --  9.2 8.7 8.5  8.3*   Liver Function Tests:  Recent Labs Lab 11/02/13 0041  AST 36  ALT 21  ALKPHOS 149*  BILITOT 0.3  PROT 9.4*  ALBUMIN 4.9   No results found for this basename: LIPASE, AMYLASE,  in the last 168 hours No results found for this basename: AMMONIA,  in the last 168  hours CBC:  Recent Labs Lab 11/02/13 0041 11/02/13 0052  WBC 27.6*  --   HGB 14.8 17.7*  HCT 44.8 52.0*  MCV 101.6*  --   PLT 408*  --    Cardiac Enzymes: No results found for this basename: CKTOTAL, CKMB, CKMBINDEX, TROPONINI,  in the last 168 hours BNP (last 3 results) No results found for this basename: PROBNP,  in the last 8760 hours CBG:  Recent Labs Lab 11/02/13 0021 11/02/13 0155 11/02/13 0311  GLUCAP >600* >600* 588*    Recent Results (from the past 240 hour(s))  MRSA PCR SCREENING     Status: None   Collection Time    11/02/13  3:24 AM      Result Value Ref Range Status   MRSA by PCR NEGATIVE  NEGATIVE Final   Comment:            The GeneXpert MRSA Assay (FDA     approved for NASAL specimens     only), is one component of a     comprehensive MRSA colonization     surveillance program. It is not     intended to diagnose MRSA     infection nor to guide or     monitor treatment for     MRSA infections.     Studies: Dg Abd Portable 1v  11/02/2013   CLINICAL DATA:  Increasing mid abdominal pain and nausea.  EXAM: PORTABLE ABDOMEN - 1 VIEW  COMPARISON:  05/12/2012  FINDINGS: Paucity of gas in the abdomen. No small or large bowel distention. No radiopaque stones. Calcified granulomas in the spleen. Visualized bones appear intact. Metallic piercings over the mid abdomen.  IMPRESSION: Nonobstructive bowel gas pattern.   Electronically Signed   By: Lucienne Capers M.D.   On: 11/02/2013 04:47    Scheduled Meds: . enoxaparin (LOVENOX) injection  40 mg Subcutaneous Q24H  . famotidine (PEPCID) IV  20 mg Intravenous Q12H  . [START ON 11/03/2013] Influenza vac split quadrivalent PF  0.5 mL Intramuscular Tomorrow-1000   Continuous Infusions: . dextrose 5 % and 0.45 % NaCl with KCl 20 mEq/L    . insulin (NOVOLIN-R) infusion 3 mL/hr at 11/02/13 0818  .  sodium bicarbonate 150 mEq in sterile water 1000 mL infusion 125 mL/hr at 11/02/13 0818       Time spent: 35  minutes    [name], [facility] Pager [phone]. If 7PM-7AM, please contact night-coverage at [email or web address], password [facility] 11/02/2013, 10:12 AM  LOS: 0 days

## 2013-11-03 DIAGNOSIS — B9789 Other viral agents as the cause of diseases classified elsewhere: Secondary | ICD-10-CM

## 2013-11-03 LAB — GLUCOSE, CAPILLARY
GLUCOSE-CAPILLARY: 219 mg/dL — AB (ref 70–99)
GLUCOSE-CAPILLARY: 254 mg/dL — AB (ref 70–99)
GLUCOSE-CAPILLARY: 221 mg/dL — AB (ref 70–99)
Glucose-Capillary: 62 mg/dL — ABNORMAL LOW (ref 70–99)
Glucose-Capillary: 70 mg/dL (ref 70–99)
Glucose-Capillary: 110 mg/dL — ABNORMAL HIGH (ref 70–99)
Glucose-Capillary: 235 mg/dL — ABNORMAL HIGH (ref 70–99)

## 2013-11-03 LAB — BASIC METABOLIC PANEL
ANION GAP: 12 (ref 5–15)
Anion gap: 10 (ref 5–15)
BUN: 9 mg/dL (ref 6–23)
BUN: 7 mg/dL (ref 6–23)
CO2: 31 meq/L (ref 19–32)
CO2: 29 mEq/L (ref 19–32)
Calcium: 7.1 mg/dL — ABNORMAL LOW (ref 8.4–10.5)
Calcium: 7.6 mg/dL — ABNORMAL LOW (ref 8.4–10.5)
Chloride: 92 mEq/L — ABNORMAL LOW (ref 96–112)
Chloride: 97 mEq/L (ref 96–112)
Creatinine, Ser: 0.58 mg/dL (ref 0.50–1.10)
Creatinine, Ser: 0.59 mg/dL (ref 0.50–1.10)
GFR calc non Af Amer: >90 mL/min (ref >90)
Glucose, Bld: 163 mg/dL — ABNORMAL HIGH (ref 70–99)
Glucose, Bld: 137 mg/dL — ABNORMAL HIGH (ref 70–99)
Potassium: 3.4 mEq/L — ABNORMAL LOW (ref 3.7–5.3)
Potassium: 3.5 mEq/L — ABNORMAL LOW (ref 3.7–5.3)
SODIUM: 136 meq/L — AB (ref 137–147)
Sodium: 135 mEq/L — ABNORMAL LOW (ref 137–147)

## 2013-11-03 LAB — CBC
HEMATOCRIT: 32.3 % — AB (ref 36.0–46.0)
HEMOGLOBIN: 11.2 g/dL — AB (ref 12.0–15.0)
MCH: 32.7 pg (ref 26.0–34.0)
MCHC: 34.7 g/dL (ref 30.0–36.0)
MCV: 94.4 fL (ref 78.0–100.0)
Platelets: 137 10*3/uL — ABNORMAL LOW (ref 150–400)
RBC: 3.42 MIL/uL — AB (ref 3.87–5.11)
RDW: 13.2 % (ref 11.5–15.5)
WBC: 7 10*3/uL (ref 4.0–10.5)

## 2013-11-03 LAB — HEPATIC FUNCTION PANEL
ALBUMIN: 2.6 g/dL — AB (ref 3.5–5.2)
ALK PHOS: 58 U/L (ref 39–117)
ALT: 9 U/L (ref 0–35)
AST: 15 U/L (ref 0–37)
Bilirubin, Direct: <0.2 mg/dL (ref 0.0–0.3)
Total Bilirubin: <0.2 mg/dL — ABNORMAL LOW (ref 0.3–1.2)
Total Protein: 5.2 g/dL — ABNORMAL LOW (ref 6.0–8.3)

## 2013-11-03 LAB — LACTIC ACID, PLASMA: LACTIC ACID, VENOUS: 0.7 mmol/L (ref 0.5–2.2)

## 2013-11-03 MED ORDER — POTASSIUM CHLORIDE CRYS ER 20 MEQ PO TBCR
40.0000 meq | EXTENDED_RELEASE_TABLET | Freq: Once | ORAL | Status: AC
  Administered 2013-11-03: 40 meq via ORAL
  Filled 2013-11-03: qty 2

## 2013-11-03 MED ORDER — SODIUM CHLORIDE 0.9 % IV SOLN
INTRAVENOUS | Status: DC
  Administered 2013-11-03 – 2013-11-05 (×5): via INTRAVENOUS

## 2013-11-03 MED ORDER — ALUM & MAG HYDROXIDE-SIMETH 200-200-20 MG/5ML PO SUSP
30.0000 mL | Freq: Four times a day (QID) | ORAL | Status: DC | PRN
Start: 2013-11-03 — End: 2013-11-05

## 2013-11-03 NOTE — Progress Notes (Signed)
Handoff report called to Barnetta Chapel, RN, BSN.  Patient transferred via wheelchair to room 1511.

## 2013-11-03 NOTE — Progress Notes (Addendum)
[facility] PROGRESS NOTE  [name] KZL:[id number] DOB: Jun 23, 1980 DOA: 11/02/2013 PCP: Mandy Jewel, MD   Brief narrative 33 year old female with uncontrolled type 1 diabetes mellitus with recurrent hospitalization for DKA, gastroparesis and GERD presented with one-day history of epigastric pain associated nausea and vomiting. Patient has uncontrolled blood glucose at home ranging in 200s to 300s. Patient was found to be in severe DKA and admitted to step down.  Assessment/Plan: Severe Diabetes ketoacidosis -Started on glucose nebulizer protocol and aggressive IV hydration and insulin drip.  She has severe metabolic acidosis with low bicarb and  anion gap of 30. Received IV bicacrb as well.  -Patient is on Lantus along with pre-meal aspart at home and has uncontrolled diabetes with A1c of 12.4. Reports to be compliant with her insulin and diet. She has not seen her endocrinologist Dr. Cruzita [name] for almost one year. Her current symptoms may have been triggered by viral gastroenteritis . - transitioned to Lantus 22 units daily with SSI. Noted for low fsg in 30s last evening. Diet advanced to diabetic. Add premeal coverage if fsg elevated.  Emphasized on dietary and medication adherence and close outpatient followup with her PCP and endocrinologist.   Nausea vomiting and abdominal pain Possibly related to viral gastroenteritis versus triggered by DKA. Patient has abdominal pain limited to the epigastric and RUQ area.  supportive care with fluids, Pepcid and antiemetics. Added maalox. check LFTs and repeat lactate.  Hypertension Meds held due to soft BP  Hypokalemia  replenish   Leukocytosis Possibly in the setting of acute stress. No signs of infection . Check this am  Lactic acidosis No signs of bowel obstruction or ischemia. Repeat this  a.m.   DVT prophylaxis: Subcutaneous Lovenox Diet: diabetic    Code Status: Full code Family Communication: None  bedside Disposition Plan: transfer to med -surg. Home tomorrow if stable   Consultants:  None  Procedures:  None  Antibiotics:  None  HPI/Subjective: Pt switched to lantus and SSI once AG closed. Had hypoglycemic episode last evening. fsg stable this am. C/o epigastric and RUQ pain and heartburn symptoms. No BM since admission  Objective: Filed Vitals:   11/03/13 0600  BP: 90/65  Pulse: 87  Temp:   Resp: 12    Intake/Output Summary (Last 24 hours) at 11/03/13 0804 Last data filed at 11/03/13 3903  Gross per 24 hour  Intake 4520.51 ml  Output   2425 ml  Net 2095.51 ml   Filed Weights   11/02/13 0304  Weight: 54.5 kg (120 lb 2.4 oz)    Exam:   General:  no acute distress  HEENT:  dry oral mucosa  Cardiovascular: S1 and S2 normal , no murmurs  Respiratory: Clear to auscultation bilaterally,  Abdomen: Soft, nondistended, bowel sounds present, epigastric and RUQ tenderness  Musculoskeletal: Warm, no edema  Data Reviewed: Basic Metabolic Panel:  Recent Labs Lab 11/02/13 0635 11/02/13 0900 11/02/13 1651 11/03/13 0115 11/03/13 0630  NA 138 136* 133* 135* 136*  K 4.9 4.3 3.7 3.4* 3.5*  CL 91* 94* 91* 92* 97  CO2 8* 12* 25 31 29   GLUCOSE 256* 194* 169* 163* 137*  BUN 16 16 16 9 7   CREATININE 0.86 0.80 0.60 0.58 0.59  CALCIUM 8.5 8.3* 7.1* 7.1* 7.6*   Liver Function Tests:  Recent Labs Lab 11/02/13 0041  AST 36  ALT 21  ALKPHOS 149*  BILITOT 0.3  PROT 9.4*  ALBUMIN 4.9   No results found for this basename: LIPASE, AMYLASE,  in the last 168 hours No results found for this basename: AMMONIA,  in the last 168 hours CBC:  Recent Labs Lab 11/02/13 0041 11/02/13 0052  WBC 27.6*  --   HGB 14.8 17.7*  HCT 44.8 52.0*  MCV 101.6*  --   PLT 408*  --    Cardiac Enzymes: No results found for this basename: CKTOTAL, CKMB, CKMBINDEX, TROPONINI,  in the last 168 hours BNP (last 3 results) No results found for this basename: PROBNP,  in the  last 8760 hours CBG:  Recent Labs Lab 11/02/13 2012 11/02/13 2145 11/02/13 2212 11/02/13 2305 11/03/13 0417  GLUCAP 139* 38* 62* 70 219*    Recent Results (from the past 240 hour(s))  MRSA PCR SCREENING     Status: None   Collection Time    11/02/13  3:24 AM      Result Value Ref Range Status   MRSA by PCR NEGATIVE  NEGATIVE Final   Comment:            The GeneXpert MRSA Assay (FDA     approved for NASAL specimens     only), is one component of a     comprehensive MRSA colonization     surveillance program. It is not     intended to diagnose MRSA     infection nor to guide or     monitor treatment for     MRSA infections.     Studies: Dg Abd Portable 1v  11/02/2013   CLINICAL DATA:  Increasing mid abdominal pain and nausea.  EXAM: PORTABLE ABDOMEN - 1 VIEW  COMPARISON:  05/12/2012  FINDINGS: Paucity of gas in the abdomen. No small or large bowel distention. No radiopaque stones. Calcified granulomas in the spleen. Visualized bones appear intact. Metallic piercings over the mid abdomen.  IMPRESSION: Nonobstructive bowel gas pattern.   Electronically Signed   By: [name] M.D.   On: 11/02/2013 04:47    Scheduled Meds: . enoxaparin (LOVENOX) injection  40 mg Subcutaneous Q24H  . famotidine (PEPCID) IV  20 mg Intravenous Q12H  . Influenza vac split quadrivalent PF  0.5 mL Intramuscular Tomorrow-1000  . insulin aspart  0-15 Units Subcutaneous TID WC  . insulin aspart  0-5 Units Subcutaneous QHS  . insulin glargine  22 Units Subcutaneous Daily  . sodium chloride  10-40 mL Intracatheter Q12H   Continuous Infusions: . sodium chloride 100 mL/hr at 11/03/13 0130       Time spent: 25 minutes    [name]  [facility] Pager [phone]. If 7PM-7AM, please contact night-coverage at [email or web address], password [facility] 11/03/2013, 8:04 AM  LOS: 1 day

## 2013-11-04 DIAGNOSIS — E876 Hypokalemia: Secondary | ICD-10-CM

## 2013-11-04 LAB — GLUCOSE, CAPILLARY
Glucose-Capillary: 266 mg/dL — ABNORMAL HIGH (ref 70–99)
Glucose-Capillary: 201 mg/dL — ABNORMAL HIGH (ref 70–99)
Glucose-Capillary: 107 mg/dL — ABNORMAL HIGH (ref 70–99)
Glucose-Capillary: 263 mg/dL — ABNORMAL HIGH (ref 70–99)

## 2013-11-04 MED ORDER — ZOLPIDEM TARTRATE 5 MG PO TABS
5.0000 mg | ORAL_TABLET | Freq: Once | ORAL | Status: AC
  Administered 2013-11-04: 5 mg via ORAL
  Filled 2013-11-04: qty 1

## 2013-11-04 MED ORDER — INSULIN ASPART 100 UNIT/ML ~~LOC~~ SOLN
3.0000 [IU] | Freq: Three times a day (TID) | SUBCUTANEOUS | Status: DC
  Administered 2013-11-04: 3 [IU] via SUBCUTANEOUS

## 2013-11-04 MED ORDER — INSULIN GLARGINE 100 UNIT/ML ~~LOC~~ SOLN
25.0000 [IU] | Freq: Every day | SUBCUTANEOUS | Status: DC
  Administered 2013-11-04: 25 [IU] via SUBCUTANEOUS
  Filled 2013-11-04 (×2): qty 0.25

## 2013-11-04 MED ORDER — FENTANYL CITRATE 0.05 MG/ML IJ SOLN
25.0000 ug | Freq: Once | INTRAMUSCULAR | Status: AC
  Administered 2013-11-04: 50 ug via INTRAVENOUS
  Filled 2013-11-04: qty 2

## 2013-11-04 NOTE — Progress Notes (Signed)
[facility] PROGRESS NOTE  [name] [id number] DOB: 05-Nov-1980 DOA: 11/02/2013 PCP: [name], MD   Brief narrative 33 year old female with uncontrolled type 1 diabetes mellitus with recurrent hospitalization for DKA, gastroparesis and GERD presented with one-day history of epigastric pain associated nausea and vomiting. Patient has uncontrolled blood glucose at home ranging in 200s to 300s. Patient was found to be in severe DKA and admitted to step down.  Assessment/Plan: Severe Diabetes ketoacidosis -Started on glucose nebulizer protocol and aggressive IV hydration and insulin drip.  She has severe metabolic acidosis with low bicarb and  anion gap of 30. Received IV bicacrb as well.  -Patient is on Lantus along with pre-meal aspart at home and has uncontrolled diabetes with A1c of 12.4. Reports to be compliant with her insulin and diet. She has not seen her endocrinologist Dr. Cruzita Lederer for almost one year. Her current symptoms may have been triggered by viral gastroenteritis . -change lantus dose to 25 units daily and added premeal coverage.   Emphasized on dietary and medication adherence and close outpatient followup with her PCP and endocrinologist.   Nausea vomiting and abdominal pain Possibly related to viral gastroenteritis versus triggered by DKA. Patient has abdominal pain limited to the epigastric and RUQ area.  supportive care with fluids, Pepcid and antiemetics. Added maalox.  LFTs normal and repeat lactate normal.  Hypertension Resume home meds  Hypokalemia  replenish   Leukocytosis  in the setting of acute stress. resolved    DVT prophylaxis: Subcutaneous Lovenox Diet: diabetic    Code Status: Full code Family Communication: None bedside Disposition Plan: Home tomorrow if fsg stable   Consultants:  None  Procedures:  None  Antibiotics:  None  HPI/Subjective: Still  C/o epigastric pain. heartburn symptoms resolved. .    Objective: Filed Vitals:   11/04/13 1410  BP: 123/87  Pulse: 81  Temp: 98.5 F (36.9 C)  Resp: 16    Intake/Output Summary (Last 24 hours) at 11/04/13 1509 Last data filed at 11/04/13 0752  Gross per 24 hour  Intake    780 ml  Output      0 ml  Net    780 ml   Filed Weights   11/02/13 0304 11/03/13 1300  Weight: 54.5 kg (120 lb 2.4 oz) 61.9 kg (136 lb 7.4 oz)    Exam:   General:  no acute distress  HEENT: moist oral mucosa  Cardiovascular: S1 and S2 normal ,   Respiratory: Clear to auscultation bilaterally,  Abdomen: Soft, nondistended, bowel sounds present, epigastric  tenderness  Musculoskeletal: Warm, no edema  Data Reviewed: Basic Metabolic Panel:  Recent Labs Lab 11/02/13 0635 11/02/13 0900 11/02/13 1651 11/03/13 0115 11/03/13 0630  NA 138 136* 133* 135* 136*  K 4.9 4.3 3.7 3.4* 3.5*  CL 91* 94* 91* 92* 97  CO2 8* 12* 25 31 29   GLUCOSE 256* 194* 169* 163* 137*  BUN 16 16 16 9 7   CREATININE 0.86 0.80 0.60 0.58 0.59  CALCIUM 8.5 8.3* 7.1* 7.1* 7.6*   Liver Function Tests:  Recent Labs Lab 11/02/13 0041 11/03/13 0915  AST 36 15  ALT 21 9  ALKPHOS 149* 58  BILITOT 0.3 <0.2*  PROT 9.4* 5.2*  ALBUMIN 4.9 2.6*   No results found for this basename: LIPASE, AMYLASE,  in the last 168 hours No results found for this basename: AMMONIA,  in the last 168 hours CBC:  Recent Labs Lab 11/02/13 0041 11/02/13 0052 11/03/13 0740  WBC 27.6*  --  7.0  HGB 14.8 17.7* 11.2*  HCT 44.8 52.0* 32.3*  MCV 101.6*  --  94.4  PLT 408*  --  137*   Cardiac Enzymes: No results found for this basename: CKTOTAL, CKMB, CKMBINDEX, TROPONINI,  in the last 168 hours BNP (last 3 results) No results found for this basename: PROBNP,  in the last 8760 hours CBG:  Recent Labs Lab 11/03/13 1122 11/03/13 1640 11/03/13 2308 11/04/13 0730 11/04/13 1137  GLUCAP 254* 221* 235* 266* 201*    Recent Results (from the past 240 hour(s))  MRSA PCR SCREENING      Status: None   Collection Time    11/02/13  3:24 AM      Result Value Ref Range Status   MRSA by PCR NEGATIVE  NEGATIVE Final   Comment:            The GeneXpert MRSA Assay (FDA     approved for NASAL specimens     only), is one component of a     comprehensive MRSA colonization     surveillance program. It is not     intended to diagnose MRSA     infection nor to guide or     monitor treatment for     MRSA infections.     Studies: No results found.  Scheduled Meds: . enoxaparin (LOVENOX) injection  40 mg Subcutaneous Q24H  . famotidine (PEPCID) IV  20 mg Intravenous Q12H  . insulin aspart  0-15 Units Subcutaneous TID WC  . insulin aspart  0-5 Units Subcutaneous QHS  . insulin aspart  3 Units Subcutaneous TID WC  . insulin glargine  25 Units Subcutaneous Daily  . sodium chloride  10-40 mL Intracatheter Q12H   Continuous Infusions: . sodium chloride 100 mL/hr at 11/04/13 1132       Time spent: 25 minutes    Omaria Plunk, [place] [facility] Pager [phone]. If 7PM-7AM, please contact night-coverage at [email or web address], password [facility] 11/04/2013, 3:09 PM  LOS: 2 days

## 2013-11-05 LAB — GLUCOSE, CAPILLARY
GLUCOSE-CAPILLARY: 272 mg/dL — AB (ref 70–99)
Glucose-Capillary: 221 mg/dL — ABNORMAL HIGH (ref 70–99)

## 2013-11-05 MED ORDER — FAMOTIDINE 20 MG PO TABS: 20.0000 mg | ORAL_TABLET | Freq: Two times a day (BID) | ORAL | Status: DC

## 2013-11-05 MED ORDER — HYDROCODONE-ACETAMINOPHEN 5-325 MG PO TABS: 1.0000 | ORAL_TABLET | Freq: Four times a day (QID) | ORAL | Status: DC | PRN

## 2013-11-05 MED ORDER — OXYCODONE HCL 5 MG PO TABS
10.0000 mg | ORAL_TABLET | Freq: Once | ORAL | Status: AC
Start: 2013-11-05 — End: 2013-11-05
  Administered 2013-11-05: 10 mg via ORAL
  Filled 2013-11-05: qty 2

## 2013-11-05 MED ORDER — INSULIN ASPART 100 UNIT/ML ~~LOC~~ SOLN
5.0000 [IU] | Freq: Three times a day (TID) | SUBCUTANEOUS | Status: DC
  Administered 2013-11-05 (×2): 5 [IU] via SUBCUTANEOUS

## 2013-11-05 MED ORDER — INSULIN GLARGINE 100 UNIT/ML ~~LOC~~ SOLN: 28.0000 [IU] | Freq: Every morning | SUBCUTANEOUS | Status: DC

## 2013-11-05 MED ORDER — INSULIN GLARGINE 100 UNIT/ML ~~LOC~~ SOLN
28.0000 [IU] | Freq: Every day | SUBCUTANEOUS | Status: DC
  Administered 2013-11-05: 28 [IU] via SUBCUTANEOUS
  Filled 2013-11-05: qty 0.28

## 2013-11-05 MED ORDER — ALUM & MAG HYDROXIDE-SIMETH 200-200-20 MG/5ML PO SUSP: 30.0000 mL | Freq: Four times a day (QID) | ORAL | Status: DC | PRN

## 2013-11-05 NOTE — Progress Notes (Signed)
Discharge instructions and medications reviewed with patient. Patient verbalizes understanding and has no questions at this time. Patient confirms she has all personal belongings in her possession at this time. Patient discharged home. 

## 2013-11-05 NOTE — Discharge Instructions (Signed)
Diabetic Ketoacidosis °Diabetic ketoacidosis (DKA) is a life-threatening complication of type 1 diabetes. It must be quickly recognized and treated. Treatment requires hospitalization. °CAUSES  °When there is no insulin in the body, glucose (sugar) cannot be used, and the body breaks down fat for energy. When fat breaks down, acids (ketones) build up in the blood. Very high levels of glucose and high levels of acids lead to severe loss of body fluids (dehydration) and other dangerous chemical changes. This stresses your vital organs and can cause coma or death. °SIGNS AND SYMPTOMS  °· Tiredness (fatigue). °· Weight loss. °· Excessive thirst. °· Ketones in your urine. °· Light-headedness. °· Fruity or sweet smelling breath. °· Excessive urination. °· Visual changes. °· Confusion or irritability. °· Nausea or vomiting. °· Rapid breathing. °· Stomachache or abdominal pain. °DIAGNOSIS  °Your health care provider will diagnose DKA based on your history, physical exam, and blood tests. The health care provider will check to see if you have another illness that caused you to go into DKA. Most of this will be done quickly in an emergency room. °TREATMENT  °· Fluid replacement to correct dehydration. °· Insulin. °· Correction of electrolytes, such as potassium and sodium. °· Antibiotic medicines. °PREVENTION °· Always take your insulin. Do not skip your insulin injections. °· If you are sick, treat yourself quickly. Your body often needs more insulin to fight the illness. °· Check your blood glucose regularly. °· Check urine ketones if your blood glucose is greater than 240 milligrams per deciliter (mg/dL). °· Do not use outdated (expired) insulin. °· If your blood glucose is high, drink plenty of fluids. This helps flush out ketones. °HOME CARE INSTRUCTIONS  °· If you are sick, follow the advice of your health care provider. °· To prevent dehydration, drink enough water and fluids to keep your urine clear or pale  yellow. °¨ If you cannot eat, alternate between drinking fluids with sugar (soda, juices, flavored gelatin) and salty fluids (broth, bouillon). °¨ If you can eat, follow your usual diet and drink sugar-free liquids (water, diet drinks). °· Always take your usual dose of insulin. If you cannot eat or if your glucose is getting too low, call your health care provider for further instructions. °· Continue to monitor your blood or urine ketones every 3-4 hours around the clock. Set your alarm clock or have someone wake you up. If you are too sick, have someone test it for you. °· Rest and avoid exercise. °SEEK MEDICAL CARE IF:  °· You have a fever. °· You have ketones in your urine, or your blood glucose is higher than a level your health care provider suggests. You may need extra insulin. Call your health care provider if you need advice on adjusting your insulin. °· You cannot drink at least a tablespoon (15 mL) of fluid every 15-20 minutes. °· You have been vomiting for more than 2 hours. °· You have symptoms of DKA: °¨ Fruity smelling breath. °¨ Breathing faster or slower. °¨ Becoming very sleepy. °SEEK IMMEDIATE MEDICAL CARE IF:  °· You have signs of dehydration: °¨ Decreased urination. °¨ Increased thirst. °¨ Dry skin and mouth. °¨ Light-headedness. °· Your blood glucose is very high (as advised by your health care provider) twice in a row. °· You faint. °· You have chest pain or trouble breathing. °· You have a sudden, severe headache. °· You have sudden weakness in one arm or one leg. °· You have sudden trouble speaking or swallowing. °· You   have vomiting or diarrhea that is getting worse after 3 hours. °· You have abdominal pain. °MAKE SURE YOU:  °· Understand these instructions. °· Will watch your condition. °· Will get help right away if you are not doing well or get worse. °Document Released: 02/06/2000 Document Revised: 02/13/2013 Document Reviewed: 08/14/2008 °[employer]® Patient Information ©2015 [employer]  [employer]. This information is not intended to replace advice given to you by your health care provider. Make sure you discuss any questions you have with your health care provider. ° °

## 2013-11-05 NOTE — Discharge Summary (Addendum)
Physician Discharge Summary  [name] RUE:[id number] DOB: 07-Sep-1980 DOA: 11/02/2013  PCP: [name], MD  Admit date: 11/02/2013 Discharge date: 11/05/2013  Time spent: 35 minutes  Recommendations for Outpatient Follow-up:  1. D/c home with outtpt PCP and endocrinology follow up  Discharge Diagnoses:  Principal Problem:   DKA, type 1   Active Problems:   Nausea and vomiting   GERD (gastroesophageal reflux disease)   DM gastroparesis   Abdominal pain, unspecified site   Viral gastroenteritis   Uncontrolled type 1 diabetes mellitus   Discharge Condition: fair  Diet recommendation: diabetic  Filed Weights   11/02/13 0304 11/03/13 1300  Weight: 54.5 kg (120 lb 2.4 oz) 61.9 kg (136 lb 7.4 oz)    History of present illness:  33 year old female with uncontrolled type 1 diabetes mellitus with recurrent hospitalization for DKA, gastroparesis and GERD presented with one-day history of epigastric pain associated nausea and vomiting. Patient has uncontrolled blood glucose at home ranging in 200s to 300s. Patient was found to be in severe DKA and admitted to step down.   Hospital Course:  Severe Diabetes ketoacidosis  -Started on glucose nebulizer protocol and aggressive IV hydration and insulin drip. She has severe metabolic acidosis with low bicarb and anion gap of 30. Received IV bicacrb as well.  -Patient is on Lantus along with pre-meal aspart at home and has uncontrolled diabetes with A1c of 12.4. -Reports to be compliant with her insulin and diet. She has not seen her endocrinologist Dr. Cruzita Lederer for almost one year. Her current symptoms may have been triggered by viral gastroenteritis .  -she is currently stable and i will increase her lantus dose to 28 units  daily along with continuing her current sliding scale coverage. Reports having glucometer and other diabetic supplies. -appt for outpt visit with Dr Cruzita Lederer has been scheduled  for this week. Emphasized on dietary  and medication adherence and close outpatient followup with her PCP and endocrinologist.   Nausea vomiting and abdominal pain  Possibly related to viral gastroenteritis versus triggered by DKA. Also has hx of GERD. Patient has abdominal pain limited to the epigastric and RUQ area. supportive care with fluids, Pepcid and antiemetics. Added maalox. LFTs normal . repeat lactate normal.  -symptoms improving. Will prescribe maalox and pepcid.  Hypertension  Resumed home meds   Hypokalemia  replenish   Leukocytosis  in the setting of acute stress. resolved    GERD   Diet: diabetic   Code Status: Full code   Family Communication: None at bedside  Disposition Plan: Home    Consultants:  None  Procedures:  None Antibiotics:  None     Procedures:  none  Consultations:  none  Discharge Exam: Filed Vitals:   11/05/13 0551  BP: 120/84  Pulse: 79  Temp: 97.9 F (36.6 C)  Resp: 18   General: no acute distress  HEENT: moist oral mucosa  Cardiovascular: S1 and S2 normal ,  Respiratory: Clear to auscultation bilaterally,  Abdomen: Soft, nondistended, bowel sounds present, mild epigastric tenderness  Musculoskeletal: Warm, no edema     Discharge Instructions You were cared for by a hospitalist during your hospital stay. If you have any questions about your discharge medications or the care you received while you were in the hospital after you are discharged, you can call the unit and asked to speak with the hospitalist on call if the hospitalist that took care of you is not available. Once you are discharged, your primary care physician will  handle any further medical issues. Please note that NO REFILLS for any discharge medications will be authorized once you are discharged, as it is imperative that you return to your primary care physician (or establish a relationship with a primary care physician if you do not have one) for your aftercare needs so that they can  reassess your need for medications and monitor your lab values.   Current Discharge Medication List    START taking these medications   Details  alum & mag hydroxide-simeth (MAALOX/MYLANTA) 200-200-20 MG/5ML suspension Take 30 mLs by mouth every 6 (six) hours as needed for indigestion or heartburn. Qty: 360 mL, Refills: 0    famotidine (PEPCID) 20 MG tablet Take 1 tablet (20 mg total) by mouth 2 (two) times daily. Qty: 20 tablet, Refills: 0      CONTINUE these medications which have CHANGED   Details  HYDROcodone-acetaminophen (NORCO/VICODIN) 5-325 MG per tablet Take 1 tablet by mouth every 6 (six) hours as needed for moderate pain. Qty: 15 tablet, Refills: 0    insulin glargine (LANTUS) 100 UNIT/ML injection Inject 0.28 mLs (28 Units total) into the skin every morning. Qty: 10 mL, Refills: 11      CONTINUE these medications which have NOT CHANGED   Details  cloNIDine (CATAPRES) 0.1 MG tablet Take 0.1 mg by mouth 2 (two) times daily.    insulin aspart (NOVOLOG) 100 UNIT/ML injection Inject 1-3 Units into the skin 3 (three) times daily with meals. CBG < 70: implement hypoglycemia protocol CBG > 400: call MD   CBG 70 - 120: 0 units CBG 121 - 150: 3 units CBG 151 - 200: 4 units CBG 201 - 250: 7 units CBG 251 - 300: 11 units CBG 301 - 350: 15 units CBG 351 - 400: 20 units CBG > 400: call MD    Multiple Vitamin (MULTIVITAMIN WITH MINERALS) TABS Take 1 tablet by mouth every morning. Diabetic support vitamin    ondansetron (ZOFRAN-ODT) 8 MG disintegrating tablet Take 8 mg by mouth every 8 (eight) hours as needed for nausea or vomiting.    glucagon (GLUCAGON EMERGENCY) 1 MG injection Inject 1 mg into the muscle once as needed (severe hypoglycemia).       Allergies  Allergen Reactions  . Scallops [Shellfish Allergy] Anaphylaxis  . Morphine And Related Itching  . Latex Rash   Follow-up Information   Follow up with [facility], MD. Schedule an appointment as soon as  possible for a visit in 1 week.   Specialty:  Internal Medicine   Contact information:   [address] [place]  [address] [phone]       Follow up with Philemon Kingdom, MD. Schedule an appointment as soon as possible for a visit in 2 weeks.   Specialty:  Internal Medicine   Contact information:   [address] [place] [address] [phone]        The results of significant diagnostics from this hospitalization (including imaging, microbiology, ancillary and laboratory) are listed below for reference.    Significant Diagnostic Studies: Dg Abd Portable 1v  11/02/2013   CLINICAL DATA:  Increasing mid abdominal pain and nausea.  EXAM: PORTABLE ABDOMEN - 1 VIEW  COMPARISON:  05/12/2012  FINDINGS: Paucity of gas in the abdomen. No small or large bowel distention. No radiopaque stones. Calcified granulomas in the spleen. Visualized bones appear intact. Metallic piercings over the mid abdomen.  IMPRESSION: Nonobstructive bowel gas pattern.   Electronically Signed   By:  Lucienne Capers M.D.   On: 11/02/2013 04:47    Microbiology: Recent Results (from the past 240 hour(s))  MRSA PCR SCREENING     Status: None   Collection Time    11/02/13  3:24 AM      Result Value Ref Range Status   MRSA by PCR NEGATIVE  NEGATIVE Final   Comment:            The GeneXpert MRSA Assay (FDA     approved for NASAL specimens     only), is one component of a     comprehensive MRSA colonization     surveillance program. It is not     intended to diagnose MRSA     infection nor to guide or     monitor treatment for     MRSA infections.     Labs: Basic Metabolic Panel:  Recent Labs Lab 11/02/13 0635 11/02/13 0900 11/02/13 1651 11/03/13 0115 11/03/13 0630  NA 138 136* 133* 135* 136*  K 4.9 4.3 3.7 3.4* 3.5*  CL 91* 94* 91* 92* 97  CO2 8* 12* 25 31 29   GLUCOSE 256* 194* 169* 163* 137*  BUN 16 16 16 9 7   CREATININE 0.86 0.80 0.60 0.58 0.59  CALCIUM 8.5  8.3* 7.1* 7.1* 7.6*   Liver Function Tests:  Recent Labs Lab 11/02/13 0041 11/03/13 0915  AST 36 15  ALT 21 9  ALKPHOS 149* 58  BILITOT 0.3 <0.2*  PROT 9.4* 5.2*  ALBUMIN 4.9 2.6*   No results found for this basename: LIPASE, AMYLASE,  in the last 168 hours No results found for this basename: AMMONIA,  in the last 168 hours CBC:  Recent Labs Lab 11/02/13 0041 11/02/13 0052 11/03/13 0740  WBC 27.6*  --  7.0  HGB 14.8 17.7* 11.2*  HCT 44.8 52.0* 32.3*  MCV 101.6*  --  94.4  PLT 408*  --  137*   Cardiac Enzymes: No results found for this basename: CKTOTAL, CKMB, CKMBINDEX, TROPONINI,  in the last 168 hours BNP: BNP (last 3 results) No results found for this basename: PROBNP,  in the last 8760 hours CBG:  Recent Labs Lab 11/04/13 0730 11/04/13 1137 11/04/13 1649 11/04/13 2047 11/05/13 0733  GLUCAP 266* 201* 107* 263* 221*       Signed:  Kahmya Pinkham  [facility] 11/05/2013, 10:44 AM

## 2013-11-05 NOTE — Progress Notes (Signed)
Per MD order, PICC line removed. Cath intact at 37cm. Vaseline pressure gauze to site, pressure held x 5min. No bleeding to site. Pt instructed to keep dressing CDI x 24 hours. Avoid heavy lifting, pushing or pulling x 24 hours,  If bleeding occurs hold pressure, if bleeding does not stop contact MD or go to the ED. Pt does not have any questions. [name] M  

## 2013-11-05 NOTE — Progress Notes (Signed)
NUTRITION FOLLOW UP  Intervention:   - Reviewed diet therapy for gastroparesis and heartburn with pt, provided handouts with RD contact information - Pt currently eating excellent without any nutritional concerns, signing off   Nutrition Dx:   Inadequate oral intake related to inability to eat as evidenced by NPO - resolved, diet advanced, pt eating 100% of meals  Goal:   Advance diet as tolerated to diabetic diet - met   Assessment:   Pt with hx of type 1 DM, GERD, and gastroparesis. Admitted with c/o abdominal pain, nausea/vomiting, that started 1pm yesterday. Found to have diabetic ketoacidosis. Told ED RN that she has been hospitilized 30-36 times for DKA.   9/11: - Met with pt who reports eating 6 small meals/day at home of things like fresh fruit, vegetables, and fish  - Drinks skim milk and water  - Said for the past 2 days her intake has gone down to just a salad, hot wings, and skim milk  - Weight down 18 pounds in the past 6 months  - Pt kept eyes closed during most of visit, appeared to be trying to rest  - DM Coordinator following  - C/o crampy abdominal pain, said she's on her period, and usually takes 1/2 hydrocodone for her symptoms  - Denies any nausea/vomiting today   9/14: - Pt discussed during multidisciplinary rounds - Diet advanced to diabetic diet 9/12 - Met with pt who reports no nausea/vomiting over the weekend - C/o epigastric pain and heartburn but states both sometimes occur on/off for her - Reports 100% meal intake   Height: Ht Readings from Last 1 Encounters:  11/03/13 _0  (1.651 m)    Weight Status:   Wt Readings from Last 1 Encounters:  11/03/13 136 lb 7.4 oz (61.9 kg)    Re-estimated needs:  Kcal: 1400-1600  Protein: 65-80g  Fluid: 1.4-1.6L/day   Skin: intact    Diet Order: Carb Control   Intake/Output Summary (Last 24 hours) at 11/05/13 0956 Last data filed at 11/04/13 1828  Gross per 24 hour  Intake    600 ml  Output       0 ml  Net    600 ml    Last BM: 9/12   Labs:   Recent Labs Lab 11/02/13 1651 11/03/13 0115 11/03/13 0630  NA 133* 135* 136*  K 3.7 3.4* 3.5*  CL 91* 92* 97  CO2 _1 BUN _2 CREATININE 0.60 0.58 0.59  CALCIUM 7.1* 7.1* 7.6*  GLUCOSE 169* 163* 137*    CBG (last 3)   Recent Labs  11/04/13 1649 11/04/13 2047 11/05/13 0733  GLUCAP 107* 263* 221*    Scheduled Meds: . enoxaparin (LOVENOX) injection  40 mg Subcutaneous Q24H  . famotidine (PEPCID) IV  20 mg Intravenous Q12H  . insulin aspart  0-15 Units Subcutaneous TID WC  . insulin aspart  0-5 Units Subcutaneous QHS  . insulin aspart  5 Units Subcutaneous TID WC  . insulin glargine  28 Units Subcutaneous Daily  . sodium chloride  10-40 mL Intracatheter Q12H    Continuous Infusions: . sodium chloride 100 mL/hr at 11/05/13 0125     Inman, RD, LDN [phone] Pager [phone] Weekend/After Hours Pager

## 2013-11-08 ENCOUNTER — Ambulatory Visit: Payer: Medicaid Other | Admitting: Internal Medicine

## 2013-11-08 ENCOUNTER — Telehealth: Payer: Self-pay | Admitting: Internal Medicine

## 2013-11-08 NOTE — Telephone Encounter (Signed)
Within 1 mo 

## 2013-11-08 NOTE — Telephone Encounter (Signed)
Patient no showed today's appt. Please advise on how to follow up. °A. No follow up necessary. °B. Follow up urgent. Contact patient immediately. °C. Follow up necessary. Contact patient and schedule visit in ___ days. °D. Follow up advised. Contact patient and schedule visit in ____weeks. ° °

## 2013-11-12 ENCOUNTER — Ambulatory Visit: Payer: Medicaid Other | Admitting: Internal Medicine

## 2013-11-12 NOTE — Telephone Encounter (Signed)
Read note below 

## 2013-11-22 NOTE — Telephone Encounter (Signed)
Spoke with pt got her scheduled for follow up

## 2013-11-28 ENCOUNTER — Telehealth: Payer: Self-pay | Admitting: Internal Medicine

## 2013-11-28 ENCOUNTER — Ambulatory Visit: Payer: Medicaid Other | Admitting: Internal Medicine

## 2013-11-28 NOTE — Telephone Encounter (Signed)
Patient no showed today's appt. Please advise on how to follow up. °A. No follow up necessary. °B. Follow up urgent. Contact patient immediately. °C. Follow up necessary. Contact patient and schedule visit in ___ days. °D. Follow up advised. Contact patient and schedule visit in ____weeks. ° °

## 2013-11-29 NOTE — Telephone Encounter (Signed)
1 mo 

## 2013-11-29 NOTE — Telephone Encounter (Signed)
Please read below...

## 2014-02-01 ENCOUNTER — Encounter (HOSPITAL_COMMUNITY): Payer: Self-pay | Admitting: Emergency Medicine

## 2014-02-01 ENCOUNTER — Inpatient Hospital Stay (HOSPITAL_COMMUNITY)
Admission: EM | Admit: 2014-02-01 | Discharge: 2014-02-04 | DRG: 639 | Disposition: A | Discharge status: Home / Self Care | Payer: Medicaid Other | Attending: Internal Medicine | Admitting: Internal Medicine

## 2014-02-01 DIAGNOSIS — E876 Hypokalemia: Secondary | ICD-10-CM | POA: Diagnosis present

## 2014-02-01 DIAGNOSIS — Z794 Long term (current) use of insulin: Secondary | ICD-10-CM

## 2014-02-01 DIAGNOSIS — D72829 Elevated white blood cell count, unspecified: Secondary | ICD-10-CM | POA: Diagnosis present

## 2014-02-01 DIAGNOSIS — Z87891 Personal history of nicotine dependence: Secondary | ICD-10-CM

## 2014-02-01 DIAGNOSIS — R780 Finding of alcohol in blood: Secondary | ICD-10-CM

## 2014-02-01 DIAGNOSIS — K219 Gastro-esophageal reflux disease without esophagitis: Secondary | ICD-10-CM | POA: Diagnosis present

## 2014-02-01 DIAGNOSIS — E1010 Type 1 diabetes mellitus with ketoacidosis without coma: Principal | ICD-10-CM | POA: Diagnosis present

## 2014-02-01 DIAGNOSIS — F410 Panic disorder [episodic paroxysmal anxiety] without agoraphobia: Secondary | ICD-10-CM | POA: Diagnosis present

## 2014-02-01 DIAGNOSIS — E039 Hypothyroidism, unspecified: Secondary | ICD-10-CM | POA: Diagnosis present

## 2014-02-01 LAB — CBG MONITORING, ED: Glucose-Capillary: 459 mg/dL — ABNORMAL HIGH (ref 70–99)

## 2014-02-01 MED ORDER — ONDANSETRON 4 MG PO TBDP
4.0000 mg | ORAL_TABLET | Freq: Once | ORAL | Status: AC
  Administered 2014-02-01: 4 mg via ORAL
  Filled 2014-02-01: qty 1

## 2014-02-01 MED ORDER — HYDROMORPHONE HCL 1 MG/ML IJ SOLN
1.0000 mg | Freq: Once | INTRAMUSCULAR | Status: AC
  Administered 2014-02-01: 1 mg via INTRAMUSCULAR
  Filled 2014-02-01: qty 1

## 2014-02-01 MED ORDER — SODIUM CHLORIDE 0.9 % IV BOLUS (SEPSIS)
2000.0000 mL | Freq: Once | INTRAVENOUS | Status: AC
  Administered 2014-02-01: 2000 mL via INTRAVENOUS

## 2014-02-01 NOTE — ED Notes (Signed)
PA at bedside.

## 2014-02-01 NOTE — ED Notes (Signed)
Bed: [facility] Expected date:  Expected time:  Means of arrival:  Comments: EMS Hyperglycemia

## 2014-02-01 NOTE — ED Provider Notes (Signed)
CSN: [id number]     Arrival date & time 02/01/14  2042 History   First MD Initiated Contact with Patient 02/01/14 2106     Chief Complaint  Patient presents with  . Hyperglycemia     (Consider location/radiation/quality/duration/timing/severity/associated sxs/prior Treatment) HPI   [name] is a 33 y.o. female insulin dependent diabetic complaining of acute onset of diffuse abdominal pain nausea vomiting onset at 5 PM tonight. Patient states that his similar prior episodes of DKA, states that she has brittle diabetes. Patient states she was in her normal state of health, eating and drinking well, compliant with her insulin, denies fever, chills, cough, rhinorrhea, chest pain, shortness of breath, myalgia, headache, change in bowel or bladder habits proceeding onset today. Patient rates her pain at 10 out of 10, she cannot describe the quality, there are no exacerbating or alleviating factors identified.  Past Medical History  Diagnosis Date  . Diabetes mellitus   . Thyroid disease     hypothyroidism associated with pregnancy  . Back pain   . Panic attack   . PONV (postoperative nausea and vomiting)   . Anxiety   . GERD (gastroesophageal reflux disease)   . DM gastroparesis    Past Surgical History  Procedure Laterality Date  . Laparoscopy      age 73  . Back surgery      age 77  . Tooth extraction    . Root canal  10/10/12   Family History  Problem Relation Age of Onset  . Diabetes Maternal Grandmother   . Cancer Maternal Grandfather     Small cell lung cancer  . Cancer Maternal Grandmother     Leukemia   History  Substance Use Topics  . Smoking status: Former Smoker    Quit date: 04/19/2000  . Smokeless tobacco: Former Systems developer  . Alcohol Use: Yes     Comment: RARE SIPS OF WINE   OB History    No data available     Review of Systems  10 systems reviewed and found to be negative, except as noted in the HPI.   Allergies  Scallops; Morphine and related;  and Latex  Home Medications   Prior to Admission medications   Medication Sig Start Date End Date Taking? Authorizing Provider  amitriptyline (ELAVIL) 50 MG tablet Take 50 mg by mouth at bedtime.   Yes Historical Provider, MD  cloNIDine (CATAPRES) 0.1 MG tablet Take 0.1 mg by mouth daily.    Yes Historical Provider, MD  famotidine (PEPCID) 20 MG tablet Take 1 tablet (20 mg total) by mouth 2 (two) times daily. Patient taking differently: Take 20 mg by mouth daily.  11/05/13  Yes Nishant Dhungel, MD  gabapentin (NEURONTIN) 100 MG capsule Take 200 mg by mouth at bedtime.   Yes Historical Provider, MD  HYDROcodone-acetaminophen (NORCO/VICODIN) 5-325 MG per tablet Take 1 tablet by mouth every 6 (six) hours as needed for moderate pain. 11/05/13  Yes Nishant Dhungel, MD  insulin aspart (NOVOLOG) 100 UNIT/ML injection Inject 1-3 Units into the skin 3 (three) times daily with meals. CBG < 70: implement hypoglycemia protocol CBG > 400: call MD   CBG 70 - 120: 0 units CBG 121 - 150: 3 units CBG 151 - 200: 4 units CBG 201 - 250: 7 units CBG 251 - 300: 11 units CBG 301 - 350: 15 units CBG 351 - 400: 20 units CBG > 400: call MD 02/01/13  Yes Donita Brooks, NP  insulin glargine (LANTUS) 100  UNIT/ML injection Inject 0.28 mLs (28 Units total) into the skin every morning. Patient taking differently: Inject 20 Units into the skin at bedtime.  11/05/13  Yes Nishant Dhungel, MD  lisinopril (PRINIVIL,ZESTRIL) 2.5 MG tablet Take 2.5 mg by mouth daily.   Yes Historical Provider, MD  lovastatin (MEVACOR) 10 MG tablet Take 10 mg by mouth at bedtime.   Yes Historical Provider, MD  Multiple Vitamin (MULTIVITAMIN WITH MINERALS) TABS Take 1 tablet by mouth every morning. Diabetic support vitamin   Yes Historical Provider, MD  nystatin-triamcinolone (MYCOLOG II) cream Apply 1 application topically 2 (two) times daily.   Yes Historical Provider, MD  ondansetron (ZOFRAN-ODT) 8 MG disintegrating tablet Take 8 mg by mouth  every 8 (eight) hours as needed for nausea or vomiting.   Yes Historical Provider, MD  alum & mag hydroxide-simeth (MAALOX/MYLANTA) 200-200-20 MG/5ML suspension Take 30 mLs by mouth every 6 (six) hours as needed for indigestion or heartburn. 11/05/13   Nishant Dhungel, MD  glucagon (GLUCAGON EMERGENCY) 1 MG injection Inject 1 mg into the muscle once as needed (severe hypoglycemia).    Historical Provider, MD   BP 106/62 mmHg  Pulse 130  Temp(Src) 98.1 F (36.7 C) (Oral)  Resp 16  SpO2 100%  LMP 12/30/2013 Physical Exam  Constitutional: She is oriented to person, place, and time. She appears well-developed and well-nourished. No distress.  Agitated, rocking  HENT:  Head: Normocephalic and atraumatic.  Dry MM  Eyes: Conjunctivae and EOM are normal. Pupils are equal, round, and reactive to light.  Neck: Normal range of motion. Neck supple.  Cardiovascular: Regular rhythm and intact distal pulses.   Tachycardic to 120s  Pulmonary/Chest: Effort normal and breath sounds normal. No stridor. No respiratory distress. She has no wheezes. She has no rales. She exhibits no tenderness.  Abdominal: Soft. Bowel sounds are normal. She exhibits no distension and no mass. There is tenderness. There is no rebound and no guarding.  Mild, diffuse tenderness to palpation with no guarding or rebound.  Murphy sign negative, no tenderness to palpation over McBurney's point, Rovsings, Psoas and obturator all negative.   Musculoskeletal: Normal range of motion. She exhibits no edema or tenderness.  Neurological: She is alert and oriented to person, place, and time.  Skin: Skin is warm.  Psychiatric: She has a normal mood and affect.  Nursing note and vitals reviewed.   ED Course  Procedures (including critical care time)  CRITICAL CARE Performed by: Monico Blitz   Total critical care time: 45  Critical care time was exclusive of separately billable procedures and treating other  patients.  Critical care was necessary to treat or prevent imminent or life-threatening deterioration.  Critical care was time spent personally by me on the following activities: development of treatment plan with patient and/or surrogate as well as nursing, discussions with consultants, evaluation of patient's response to treatment, examination of patient, obtaining history from patient or surrogate, ordering and performing treatments and interventions, ordering and review of laboratory studies, ordering and review of radiographic studies, pulse oximetry and re-evaluation of patient's condition.   Labs Review Labs Reviewed  CBC WITH DIFFERENTIAL - Abnormal; Notable for the following:    WBC 18.0 (*)    Neutrophils Relative % 89 (*)    Lymphocytes Relative 9 (*)    Monocytes Relative 2 (*)    Neutro Abs 16.0 (*)    All other components within normal limits  BASIC METABOLIC PANEL - Abnormal; Notable for the following:  Sodium 135 (*)    Chloride 85 (*)    CO2 7 (*)    Glucose, Bld 564 (*)    Anion gap 43 (*)    All other components within normal limits  URINALYSIS, ROUTINE W REFLEX MICROSCOPIC - Abnormal; Notable for the following:    APPearance CLOUDY (*)    Glucose, UA >1000 (*)    Ketones, ur >80 (*)    All other components within normal limits  BLOOD GAS, VENOUS - Abnormal; Notable for the following:    pH, Ven 7.152 (*)    pCO2, Ven 23.5 (*)    pO2, Ven 49.2 (*)    Bicarbonate 7.9 (*)    Acid-base deficit 20.1 (*)    All other components within normal limits  BLOOD GAS, ARTERIAL - Abnormal; Notable for the following:    pH, Arterial 7.146 (*)    pCO2 arterial 14.9 (*)    pO2, Arterial 74.0 (*)    Bicarbonate 5.0 (*)    Acid-base deficit 23.3 (*)    All other components within normal limits  URINE MICROSCOPIC-ADD ON - Abnormal; Notable for the following:    Squamous Epithelial / LPF MANY (*)    Bacteria, UA FEW (*)    All other components within normal limits  CBG  MONITORING, ED - Abnormal; Notable for the following:    Glucose-Capillary 459 (*)    All other components within normal limits  I-STAT CHEM 8, ED - Abnormal; Notable for the following:    Sodium 131 (*)    Glucose, Bld 577 (*)    Hemoglobin 16.7 (*)    HCT 49.0 (*)    All other components within normal limits  CBG MONITORING, ED - Abnormal; Notable for the following:    Glucose-Capillary 512 (*)    All other components within normal limits  I-STAT BETA HCG BLOOD, ED ([facility], [facility], [facility] ONLY)  I-STAT BETA HCG BLOOD, ED ([facility], [facility], [facility] ONLY)    Imaging Review Dg Chest 2 View  02/02/2014   CLINICAL DATA:  Severe chest and abdominal pain, DKA.  EXAM: CHEST  2 VIEW  COMPARISON:  Chest radiograph May 18, 2013  FINDINGS: Cardiomediastinal silhouette is unremarkable. The lungs are clear without pleural effusions or focal consolidations. Trachea projects midline and there is no pneumothorax. Soft tissue planes and included osseous structures are non-suspicious. Bilateral nipple piercings. Surgical clips in the neck most consistent with thyroidectomy.  IMPRESSION: No acute cardiopulmonary process ; normal chest radiograph.   Electronically Signed   By: Elon Alas   On: 02/02/2014 01:22     EKG Interpretation None      MDM   Final diagnoses:  Diabetic ketoacidosis without coma associated with type 1 diabetes mellitus    Filed Vitals:   02/01/14 2043 02/01/14 2121 02/02/14 0051  BP:  112/61 106/62  Pulse:  119 130  Temp:  98.1 F (36.7 C)   TempSrc:  Oral   Resp:  16 16  SpO2: 99% 100% 100%    Medications  dextrose 5 %-0.45 % sodium chloride infusion (not administered)  insulin regular (NOVOLIN R,HUMULIN R) 250 Units in sodium chloride 0.9 % 250 mL (1 Units/mL) infusion (4.5 Units/hr Intravenous New Bag/Given 02/02/14 0125)  sodium chloride 0.9 % bolus 1,000 mL (not administered)  fentaNYL (SUBLIMAZE) injection 50 mcg (not administered)  fentaNYL (SUBLIMAZE) injection 50 mcg (not  administered)  sodium chloride 0.9 % bolus 2,000 mL (2,000 mLs Intravenous New Bag/Given 02/01/14 2150)  HYDROmorphone (DILAUDID)  injection 1 mg (1 mg Intramuscular Given 02/01/14 2238)  ondansetron (ZOFRAN-ODT) disintegrating tablet 4 mg (4 mg Oral Given 02/01/14 2237)  sodium bicarbonate injection 100 mEq (100 mEq Intravenous Given 02/02/14 0157)    AVIN UPPERMAN is a pleasant 33 y.o. female presenting with DKA, multiple episodes of nausea and vomiting in addition to abdominal pain blood sugar is elevated. There is been multiple delays in obtaining blood work initially patient refused blood work before pain medication, patient was given 1 mg of Dilaudid IM and then her IV blew, we have finally obtained IV access she has a anion gap of 29 her potassium is normal at 5.1. Her glucose is 577. Glucose stabilizer protocol initiated. Patient will need admission for DKA. Patient's ABG shows pH of 1.5 to bicarbonate of 7.9. ABG is ordered to confirm.  Serial abdominal exams remained nonsurgical. ABG confirms the severe acidosis of 7.146. Her bicarbonate is 5. Leukocytosis of 18 is likely a stress response. Sodium corrected for hyperglycemia is 139. Urinalysis  confirms DKA with greater than 80 ketones. UA is not consistent with infection and chest x-rays without infiltrate.  Case discussed with [facility] hospital[facility] Dr. Arnoldo Morale who accepts admission to a step down bed.   Monico Blitz, PA-C 02/02/14 0155  April K Palumbo-Rasch, MD 02/02/14 [phone]

## 2014-02-01 NOTE — ED Notes (Signed)
2 attempts made for IV placement and to obtain blood specimens. Unsuccessful. Pt calm and cooperative.

## 2014-02-01 NOTE — ED Notes (Signed)
Patient states she would pain meds before labs be drawn. RN made aware.

## 2014-02-01 NOTE — ED Notes (Signed)
Pt transported from home with c/o upper abd pain, +n/v, pt given Zofran 8mg  IV and NS bolus by EMS. CBG 412, last Insulin at 1400, A & O. Pt has child with her.

## 2014-02-02 ENCOUNTER — Emergency Department (HOSPITAL_COMMUNITY): Payer: Medicaid Other

## 2014-02-02 DIAGNOSIS — E1010 Type 1 diabetes mellitus with ketoacidosis without coma: Secondary | ICD-10-CM | POA: Diagnosis not present

## 2014-02-02 DIAGNOSIS — R1084 Generalized abdominal pain: Secondary | ICD-10-CM

## 2014-02-02 DIAGNOSIS — E079 Disorder of thyroid, unspecified: Secondary | ICD-10-CM

## 2014-02-02 DIAGNOSIS — D72829 Elevated white blood cell count, unspecified: Secondary | ICD-10-CM

## 2014-02-02 DIAGNOSIS — R112 Nausea with vomiting, unspecified: Secondary | ICD-10-CM

## 2014-02-02 DIAGNOSIS — F410 Panic disorder [episodic paroxysmal anxiety] without agoraphobia: Secondary | ICD-10-CM | POA: Diagnosis present

## 2014-02-02 DIAGNOSIS — K219 Gastro-esophageal reflux disease without esophagitis: Secondary | ICD-10-CM | POA: Diagnosis present

## 2014-02-02 DIAGNOSIS — Z87891 Personal history of nicotine dependence: Secondary | ICD-10-CM | POA: Diagnosis not present

## 2014-02-02 DIAGNOSIS — Z794 Long term (current) use of insulin: Secondary | ICD-10-CM | POA: Diagnosis not present

## 2014-02-02 DIAGNOSIS — E876 Hypokalemia: Secondary | ICD-10-CM | POA: Diagnosis present

## 2014-02-02 DIAGNOSIS — E1065 Type 1 diabetes mellitus with hyperglycemia: Secondary | ICD-10-CM

## 2014-02-02 DIAGNOSIS — E039 Hypothyroidism, unspecified: Secondary | ICD-10-CM | POA: Diagnosis present

## 2014-02-02 DIAGNOSIS — R780 Finding of alcohol in blood: Secondary | ICD-10-CM | POA: Diagnosis not present

## 2014-02-02 LAB — BASIC METABOLIC PANEL
ANION GAP: 28 — AB (ref 5–15)
ANION GAP: 17 — AB (ref 5–15)
ANION GAP: 14 (ref 5–15)
ANION GAP: 17 — AB (ref 5–15)
Anion gap: 43 — ABNORMAL HIGH (ref 5–15)
Anion gap: 19 — ABNORMAL HIGH (ref 5–15)
BUN: 16 mg/dL (ref 6–23)
BUN: 14 mg/dL (ref 6–23)
BUN: 12 mg/dL (ref 6–23)
BUN: 10 mg/dL (ref 6–23)
BUN: 10 mg/dL (ref 6–23)
BUN: 17 mg/dL (ref 6–23)
CALCIUM: 7.9 mg/dL — AB (ref 8.4–10.5)
CALCIUM: 8 mg/dL — AB (ref 8.4–10.5)
CALCIUM: 8.2 mg/dL — AB (ref 8.4–10.5)
CALCIUM: 10.2 mg/dL (ref 8.4–10.5)
CO2: 7 mEq/L — CL (ref 19–32)
CO2: 8 mEq/L — CL (ref 19–32)
CO2: 15 mEq/L — ABNORMAL LOW (ref 19–32)
CO2: 17 mEq/L — ABNORMAL LOW (ref 19–32)
CO2: 17 mEq/L — ABNORMAL LOW (ref 19–32)
CO2: 14 mEq/L — ABNORMAL LOW (ref 19–32)
CREATININE: 0.84 mg/dL (ref 0.50–1.10)
Calcium: 8.1 mg/dL — ABNORMAL LOW (ref 8.4–10.5)
Calcium: 7.9 mg/dL — ABNORMAL LOW (ref 8.4–10.5)
Chloride: 85 mEq/L — ABNORMAL LOW (ref 96–112)
Chloride: 105 mEq/L (ref 96–112)
Chloride: 105 mEq/L (ref 96–112)
Chloride: 103 mEq/L (ref 96–112)
Chloride: 100 mEq/L (ref 96–112)
Chloride: 107 mEq/L (ref 96–112)
Creatinine, Ser: 0.73 mg/dL (ref 0.50–1.10)
Creatinine, Ser: 0.6 mg/dL (ref 0.50–1.10)
Creatinine, Ser: 0.54 mg/dL (ref 0.50–1.10)
Creatinine, Ser: 0.55 mg/dL (ref 0.50–1.10)
Creatinine, Ser: 0.56 mg/dL (ref 0.50–1.10)
GFR calc Af Amer: >90 mL/min (ref >90)
GFR calc Af Amer: >90 mL/min (ref >90)
GFR calc Af Amer: >90 mL/min (ref >90)
GFR calc Af Amer: >90 mL/min (ref >90)
GFR calc non Af Amer: >90 mL/min (ref >90)
GFR calc non Af Amer: >90 mL/min (ref >90)
GLUCOSE: 149 mg/dL — AB (ref 70–99)
GLUCOSE: 133 mg/dL — AB (ref 70–99)
Glucose, Bld: 564 mg/dL (ref 70–99)
Glucose, Bld: 246 mg/dL — ABNORMAL HIGH (ref 70–99)
Glucose, Bld: 209 mg/dL — ABNORMAL HIGH (ref 70–99)
Glucose, Bld: 127 mg/dL — ABNORMAL HIGH (ref 70–99)
POTASSIUM: 4.4 meq/L (ref 3.7–5.3)
Potassium: 5.3 mEq/L (ref 3.7–5.3)
Potassium: 3.9 mEq/L (ref 3.7–5.3)
Potassium: 3.9 mEq/L (ref 3.7–5.3)
Potassium: 3.9 mEq/L (ref 3.7–5.3)
Potassium: 4.7 mEq/L (ref 3.7–5.3)
SODIUM: 141 meq/L (ref 137–147)
SODIUM: 137 meq/L (ref 137–147)
SODIUM: 134 meq/L — AB (ref 137–147)
SODIUM: 134 meq/L — AB (ref 137–147)
Sodium: 135 mEq/L — ABNORMAL LOW (ref 137–147)
Sodium: 140 mEq/L (ref 137–147)

## 2014-02-02 LAB — BLOOD GAS, ARTERIAL
ACID-BASE DEFICIT: 23.3 mmol/L — AB (ref 0.0–2.0)
Acid-base deficit: 15.8 mmol/L — ABNORMAL HIGH (ref 0.0–2.0)
Bicarbonate: 5 mEq/L — ABNORMAL LOW (ref 20.0–24.0)
Bicarbonate: 9.1 mEq/L — ABNORMAL LOW (ref 20.0–24.0)
DRAWN BY: 31814
DRAWN BY: 232811
FIO2: 0.21 %
FIO2: 0.21 %
O2 SAT: 90.3 %
O2 Saturation: 97.7 %
PATIENT TEMPERATURE: 98.1
PH ART: 7.287 — AB (ref 7.350–7.450)
PO2 ART: 74 mmHg — AB (ref 80.0–100.0)
PO2 ART: 118 mmHg — AB (ref 80.0–100.0)
Patient temperature: 98.9
TCO2: 4.7 mmol/L (ref 0–100)
TCO2: 8.4 mmol/L (ref 0–100)
pCO2 arterial: 14.9 mmHg — CL (ref 35.0–45.0)
pCO2 arterial: 19.7 mmHg — CL (ref 35.0–45.0)
pH, Arterial: 7.146 — CL (ref 7.350–7.450)

## 2014-02-02 LAB — CBC WITH DIFFERENTIAL/PLATELET
Basophils Absolute: 0 10*3/uL (ref 0.0–0.1)
Basophils Relative: 0 % (ref 0–1)
EOS PCT: 0 % (ref 0–5)
Eosinophils Absolute: 0 10*3/uL (ref 0.0–0.7)
HEMATOCRIT: 43.9 % (ref 36.0–46.0)
Hemoglobin: 14.7 g/dL (ref 12.0–15.0)
LYMPHS ABS: 1.6 10*3/uL (ref 0.7–4.0)
Lymphocytes Relative: 9 % — ABNORMAL LOW (ref 12–46)
MCH: 31.5 pg (ref 26.0–34.0)
MCHC: 33.5 g/dL (ref 30.0–36.0)
MCV: 94.2 fL (ref 78.0–100.0)
MONO ABS: 0.4 10*3/uL (ref 0.1–1.0)
Monocytes Relative: 2 % — ABNORMAL LOW (ref 3–12)
NEUTROS ABS: 16 10*3/uL — AB (ref 1.7–7.7)
Neutrophils Relative %: 89 % — ABNORMAL HIGH (ref 43–77)
PLATELETS: 330 10*3/uL (ref 150–400)
RBC: 4.66 MIL/uL (ref 3.87–5.11)
RDW: 12.5 % (ref 11.5–15.5)
WBC: 18 10*3/uL — ABNORMAL HIGH (ref 4.0–10.5)

## 2014-02-02 LAB — URINALYSIS, ROUTINE W REFLEX MICROSCOPIC
Bilirubin Urine: NEGATIVE
HGB URINE DIPSTICK: NEGATIVE
Leukocytes, UA: NEGATIVE
Nitrite: NEGATIVE
Protein, ur: NEGATIVE mg/dL
SPECIFIC GRAVITY, URINE: 1.023 (ref 1.005–1.030)
Urobilinogen, UA: 0.2 mg/dL (ref 0.0–1.0)
pH: 5.5 (ref 5.0–8.0)

## 2014-02-02 LAB — BLOOD GAS, VENOUS
Acid-base deficit: 20.1 mmol/L — ABNORMAL HIGH (ref 0.0–2.0)
BICARBONATE: 7.9 meq/L — AB (ref 20.0–24.0)
FIO2: 0.21 %
O2 SAT: 72.8 %
PH VEN: 7.152 — AB (ref 7.250–7.300)
Patient temperature: 98.1
TCO2: 7.4 mmol/L (ref 0–100)
pCO2, Ven: 23.5 mmHg — ABNORMAL LOW (ref 45.0–50.0)
pO2, Ven: 49.2 mmHg — ABNORMAL HIGH (ref 30.0–45.0)

## 2014-02-02 LAB — GLUCOSE, CAPILLARY
GLUCOSE-CAPILLARY: 214 mg/dL — AB (ref 70–99)
GLUCOSE-CAPILLARY: 204 mg/dL — AB (ref 70–99)
GLUCOSE-CAPILLARY: 168 mg/dL — AB (ref 70–99)
GLUCOSE-CAPILLARY: 124 mg/dL — AB (ref 70–99)
GLUCOSE-CAPILLARY: 118 mg/dL — AB (ref 70–99)
Glucose-Capillary: 268 mg/dL — ABNORMAL HIGH (ref 70–99)
Glucose-Capillary: 359 mg/dL — ABNORMAL HIGH (ref 70–99)
Glucose-Capillary: 213 mg/dL — ABNORMAL HIGH (ref 70–99)
Glucose-Capillary: 179 mg/dL — ABNORMAL HIGH (ref 70–99)
Glucose-Capillary: 139 mg/dL — ABNORMAL HIGH (ref 70–99)
Glucose-Capillary: 137 mg/dL — ABNORMAL HIGH (ref 70–99)
Glucose-Capillary: 113 mg/dL — ABNORMAL HIGH (ref 70–99)
Glucose-Capillary: 121 mg/dL — ABNORMAL HIGH (ref 70–99)
Glucose-Capillary: 220 mg/dL — ABNORMAL HIGH (ref 70–99)

## 2014-02-02 LAB — I-STAT CHEM 8, ED
BUN: 21 mg/dL (ref 6–23)
CREATININE: 0.8 mg/dL (ref 0.50–1.10)
Calcium, Ion: 1.16 mmol/L (ref 1.12–1.23)
Chloride: 101 mEq/L (ref 96–112)
Glucose, Bld: 577 mg/dL (ref 70–99)
HCT: 49 % — ABNORMAL HIGH (ref 36.0–46.0)
HEMOGLOBIN: 16.7 g/dL — AB (ref 12.0–15.0)
POTASSIUM: 5.1 meq/L (ref 3.7–5.3)
Sodium: 131 mEq/L — ABNORMAL LOW (ref 137–147)
TCO2: 7 mmol/L (ref 0–100)

## 2014-02-02 LAB — I-STAT BETA HCG BLOOD, ED (MC, WL, AP ONLY): I-stat hCG, quantitative: <5 m[IU]/mL (ref <5)

## 2014-02-02 LAB — URINE MICROSCOPIC-ADD ON

## 2014-02-02 LAB — CBG MONITORING, ED
GLUCOSE-CAPILLARY: 449 mg/dL — AB (ref 70–99)
Glucose-Capillary: 512 mg/dL — ABNORMAL HIGH (ref 70–99)

## 2014-02-02 LAB — MRSA PCR SCREENING: MRSA by PCR: NEGATIVE

## 2014-02-02 MED ORDER — PANTOPRAZOLE SODIUM 40 MG PO TBEC
40.0000 mg | DELAYED_RELEASE_TABLET | Freq: Every day | ORAL | Status: DC
  Administered 2014-02-02 – 2014-02-04 (×3): 40 mg via ORAL
  Filled 2014-02-02 (×3): qty 1

## 2014-02-02 MED ORDER — SODIUM CHLORIDE 0.9 % IV BOLUS (SEPSIS)
1000.0000 mL | Freq: Once | INTRAVENOUS | Status: AC
  Administered 2014-02-02: 1000 mL via INTRAVENOUS

## 2014-02-02 MED ORDER — INSULIN GLARGINE 100 UNIT/ML ~~LOC~~ SOLN
20.0000 [IU] | SUBCUTANEOUS | Status: DC
  Filled 2014-02-02: qty 0.2

## 2014-02-02 MED ORDER — SODIUM CHLORIDE 0.9 % IV SOLN
INTRAVENOUS | Status: DC
  Administered 2014-02-02: 04:00:00 via INTRAVENOUS
  Filled 2014-02-02: qty 2.5

## 2014-02-02 MED ORDER — SODIUM BICARBONATE 8.4 % IV SOLN
100.0000 meq | Freq: Once | INTRAVENOUS | Status: AC
  Administered 2014-02-02: 100 meq via INTRAVENOUS
  Filled 2014-02-02: qty 50

## 2014-02-02 MED ORDER — ENOXAPARIN SODIUM 30 MG/0.3ML ~~LOC~~ SOLN
30.0000 mg | SUBCUTANEOUS | Status: DC
  Administered 2014-02-02: 30 mg via SUBCUTANEOUS
  Filled 2014-02-02: qty 0.3

## 2014-02-02 MED ORDER — SODIUM CHLORIDE 0.9 % IV BOLUS (SEPSIS)
2000.0000 mL | Freq: Once | INTRAVENOUS | Status: AC
  Administered 2014-02-02: 2000 mL via INTRAVENOUS

## 2014-02-02 MED ORDER — FENTANYL CITRATE 0.05 MG/ML IJ SOLN
50.0000 ug | Freq: Once | INTRAMUSCULAR | Status: AC
  Administered 2014-02-02: 50 ug via INTRAVENOUS
  Filled 2014-02-02: qty 2

## 2014-02-02 MED ORDER — INSULIN GLARGINE 100 UNIT/ML ~~LOC~~ SOLN: 28.0000 [IU] | SUBCUTANEOUS | Status: DC

## 2014-02-02 MED ORDER — SODIUM BICARBONATE 8.4 % IV SOLN
INTRAVENOUS | Status: DC
  Administered 2014-02-02 (×2): via INTRAVENOUS
  Filled 2014-02-02 (×5): qty 150

## 2014-02-02 MED ORDER — SODIUM CHLORIDE 0.9 % IV SOLN
INTRAVENOUS | Status: AC
  Administered 2014-02-02: 04:00:00 via INTRAVENOUS

## 2014-02-02 MED ORDER — DEXTROSE-NACL 5-0.45 % IV SOLN
INTRAVENOUS | Status: DC
  Administered 2014-02-02: 05:00:00 via INTRAVENOUS

## 2014-02-02 MED ORDER — DEXTROSE-NACL 5-0.45 % IV SOLN
INTRAVENOUS | Status: DC
  Administered 2014-02-02: 21:00:00 via INTRAVENOUS

## 2014-02-02 MED ORDER — SODIUM CHLORIDE 0.9 % IV SOLN
INTRAVENOUS | Status: DC
  Administered 2014-02-02 (×2): via INTRAVENOUS

## 2014-02-02 MED ORDER — FENTANYL CITRATE 0.05 MG/ML IJ SOLN
50.0000 ug | INTRAMUSCULAR | Status: DC | PRN
  Administered 2014-02-02 – 2014-02-04 (×25): 50 ug via INTRAVENOUS
  Filled 2014-02-02 (×25): qty 2

## 2014-02-02 MED ORDER — SODIUM CHLORIDE 0.9 % IV SOLN: INTRAVENOUS | Status: DC

## 2014-02-02 MED ORDER — ENOXAPARIN SODIUM 40 MG/0.4ML ~~LOC~~ SOLN
40.0000 mg | SUBCUTANEOUS | Status: DC
  Administered 2014-02-03 – 2014-02-04 (×2): 40 mg via SUBCUTANEOUS
  Filled 2014-02-02 (×2): qty 0.4

## 2014-02-02 MED ORDER — SODIUM CHLORIDE 0.9 % IV SOLN
INTRAVENOUS | Status: DC
  Administered 2014-02-02: 6 [IU]/h via INTRAVENOUS
  Administered 2014-02-02: 4.5 [IU]/h via INTRAVENOUS
  Filled 2014-02-02: qty 2.5

## 2014-02-02 NOTE — ED Notes (Signed)
Pt only had one istat Beta HCG test done.  Disregard duplicate

## 2014-02-02 NOTE — ED Notes (Signed)
Autumn, Charge RN notified of pt needing additional IV access due to acuity. Charge RN willing to attempt IV access.

## 2014-02-02 NOTE — ED Notes (Signed)
Critical Labs: Glucose 564  CO2 7  Read back and verified with Amy (lab tech) Primary nurse and EDP notified.

## 2014-02-02 NOTE — ED Notes (Signed)
Pt ambulated to restroom with RN at back to bed 19. Stretcher locked and in lowest position. Side rails up x 2.

## 2014-02-02 NOTE — H&P (Signed)
[facility] Admission History and Physical       [name] ASN:[id number] DOB: 10/31/1980 DOA: 02/01/2014  Referring physician: EDP PCP: [name], MD  Specialists:   Chief Complaint: ABD Pain Nausea and Vomiting  HPI: [name] is a 33 y.o. female with Brittle Type 1 DM who presents to the ED with complaints of ABD pain Nausea and Vomiting `since the AM.  When she was evaluated in the ED she ws found to have a Glucose of 577, and an Anion Gap of 22.   She was placed on the DKA protocol and referred for medical admission.     Review of Systems:  Constitutional: No Weight Loss, No Weight Gain, Night Sweats, Fevers, Chills, Dizziness, Fatigue, or Generalized Weakness HEENT: No Headaches, Difficulty Swallowing,Tooth/Dental Problems,Sore Throat,  No Sneezing, Rhinitis, Ear Ache, Nasal Congestion, or Post Nasal Drip,  Cardio-vascular:  No Chest pain, Orthopnea, PND, Edema in Lower Extremities, Anasarca, Dizziness, Palpitations  Resp: No Dyspnea, No DOE, No Cough, No Hemoptysis, No Wheezing.    GI: No Heartburn, Indigestion, +Abdominal Pain, +Nausea, +Vomiting, Diarrhea, Hematemesis, Hematochezia, Melena, Change in Bowel Habits,  Loss of Appetite  GU: No Dysuria, Change in Color of Urine, No Urgency or Frequency, No Flank pain.  Musculoskeletal: No Joint Pain or Swelling, No Decreased Range of Motion, No Back Pain.  Neurologic: No Syncope, No Seizures, Muscle Weakness, Paresthesia, Vision Disturbance or Loss, No Diplopia, No Vertigo, No Difficulty Walking,  Skin: No Rash or Lesions. Psych: No Change in Mood or Affect, No Depression, +Anxiety, No Memory loss, No Confusion, or Hallucinations   Past Medical History  Diagnosis Date  . Diabetes mellitus   . Thyroid disease     hypothyroidism associated with pregnancy  . Back pain   . Panic attack   . PONV (postoperative nausea and vomiting)   . Anxiety   . GERD (gastroesophageal reflux disease)   . DM  gastroparesis       Past Surgical History  Procedure Laterality Date  . Laparoscopy      age 67  . Back surgery      age 89  . Tooth extraction    . Root canal  10/10/12       Prior to Admission medications   Medication Sig Start Date End Date Taking? Authorizing Provider  amitriptyline (ELAVIL) 50 MG tablet Take 50 mg by mouth at bedtime.   Yes Historical Provider, MD  cloNIDine (CATAPRES) 0.1 MG tablet Take 0.1 mg by mouth daily.    Yes Historical Provider, MD  famotidine (PEPCID) 20 MG tablet Take 1 tablet (20 mg total) by mouth 2 (two) times daily. Patient taking differently: Take 20 mg by mouth daily.  11/05/13  Yes Nishant Dhungel, MD  gabapentin (NEURONTIN) 100 MG capsule Take 200 mg by mouth at bedtime.   Yes Historical Provider, MD  HYDROcodone-acetaminophen (NORCO/VICODIN) 5-325 MG per tablet Take 1 tablet by mouth every 6 (six) hours as needed for moderate pain. 11/05/13  Yes Nishant Dhungel, MD  insulin aspart (NOVOLOG) 100 UNIT/ML injection Inject 1-3 Units into the skin 3 (three) times daily with meals. CBG < 70: implement hypoglycemia protocol CBG > 400: call MD   CBG 70 - 120: 0 units CBG 121 - 150: 3 units CBG 151 - 200: 4 units CBG 201 - 250: 7 units CBG 251 - 300: 11 units CBG 301 - 350: 15 units CBG 351 - 400: 20 units CBG > 400: call MD 02/01/13  Yes Velna Hatchet  Talbert Forest, NP  insulin glargine (LANTUS) 100 UNIT/ML injection Inject 0.28 mLs (28 Units total) into the skin every morning. Patient taking differently: Inject 20 Units into the skin at bedtime.  11/05/13  Yes Nishant Dhungel, MD  lisinopril (PRINIVIL,ZESTRIL) 2.5 MG tablet Take 2.5 mg by mouth daily.   Yes Historical Provider, MD  lovastatin (MEVACOR) 10 MG tablet Take 10 mg by mouth at bedtime.   Yes Historical Provider, MD  Multiple Vitamin (MULTIVITAMIN WITH MINERALS) TABS Take 1 tablet by mouth every morning. Diabetic support vitamin   Yes Historical Provider, MD  nystatin-triamcinolone (MYCOLOG II)  cream Apply 1 application topically 2 (two) times daily.   Yes Historical Provider, MD  ondansetron (ZOFRAN-ODT) 8 MG disintegrating tablet Take 8 mg by mouth every 8 (eight) hours as needed for nausea or vomiting.   Yes Historical Provider, MD  alum & mag hydroxide-simeth (MAALOX/MYLANTA) 200-200-20 MG/5ML suspension Take 30 mLs by mouth every 6 (six) hours as needed for indigestion or heartburn. 11/05/13   Nishant Dhungel, MD  glucagon (GLUCAGON EMERGENCY) 1 MG injection Inject 1 mg into the muscle once as needed (severe hypoglycemia).    Historical Provider, MD      Allergies  Allergen Reactions  . Scallops [Shellfish Allergy] Anaphylaxis  . Morphine And Related Itching  . Latex Rash     Social History:  reports that she quit smoking about 13 years ago. She has quit using smokeless tobacco. She reports that she drinks alcohol. She reports that she does not use illicit drugs.     Family History  Problem Relation Age of Onset  . Diabetes Maternal Grandmother   . Cancer Maternal Grandfather     Small cell lung cancer  . Cancer Maternal Grandmother     Leukemia       Physical Exam:  GEN:  Pleasant Anxious well Nourished and Well Developed  33 y.o. Caucasian female examined  and in mild acute distress; cooperative with exam Filed Vitals:   02/01/14 2043 02/01/14 2121 02/02/14 0051  BP:  112/61 106/62  Pulse:  119 130  Temp:  98.1 F (36.7 C)   TempSrc:  Oral   Resp:  16 16  SpO2: 99% 100% 100%   Blood pressure 106/62, pulse 130, temperature 98.1 F (36.7 C), temperature source Oral, resp. rate 16, last menstrual period 12/30/2013, SpO2 100 %. PSYCH: She is alert and oriented x4; does not appear anxious does not appear depressed; affect is normal HEENT: Normocephalic and Atraumatic, Mucous membranes pink; PERRLA; EOM intact; Fundi:  Benign;  No scleral icterus, Nares: Patent, Oropharynx: Clear,  Fair Dentition,    Neck:  FROM, No Cervical Lymphadenopathy nor Thyromegaly or  Carotid Bruit; No JVD; Breasts:: Not examined CHEST WALL: No tenderness CHEST: Normal respiration, clear to auscultation bilaterally HEART: Regular rate and rhythm; no murmurs rubs or gallops BACK: No kyphosis or scoliosis; No CVA tenderness ABDOMEN: Positive Bowel Sounds, Soft Non-Tender; No Masses, No Organomegaly Rectal Exam: Not done EXTREMITIES: No Cyanosis, Clubbing, or Edema; No Ulcerations. Genitalia: not examined PULSES: 2+ and symmetric SKIN: Normal hydration no rash or ulceration CNS:   Vascular: pulses palpable throughout    Labs on Admission:  Basic Metabolic Panel:  Recent Labs Lab 02/02/14 0008 02/02/14 0019  NA 135* 131*  K 5.3 5.1  CL 85* 101  CO2 7*  --   GLUCOSE 564* 577*  BUN 17 21  CREATININE 0.84 0.80  CALCIUM 10.2  --    Liver Function Tests: No results  for input(s): AST, ALT, ALKPHOS, BILITOT, PROT, ALBUMIN in the last 168 hours. No results for input(s): LIPASE, AMYLASE in the last 168 hours. No results for input(s): AMMONIA in the last 168 hours. CBC:  Recent Labs Lab 02/02/14 0008 02/02/14 0019  WBC 18.0*  --   NEUTROABS 16.0*  --   HGB 14.7 16.7*  HCT 43.9 49.0*  MCV 94.2  --   PLT 330  --    Cardiac Enzymes: No results for input(s): CKTOTAL, CKMB, CKMBINDEX, TROPONINI in the last 168 hours.  BNP (last 3 results) No results for input(s): PROBNP in the last 8760 hours. CBG:  Recent Labs Lab 02/01/14 2150 02/02/14 0125  GLUCAP 459* 512*    Radiological Exams on Admission: Dg Chest 2 View  02/02/2014   CLINICAL DATA:  Severe chest and abdominal pain, DKA.  EXAM: CHEST  2 VIEW  COMPARISON:  Chest radiograph May 18, 2013  FINDINGS: Cardiomediastinal silhouette is unremarkable. The lungs are clear without pleural effusions or focal consolidations. Trachea projects midline and there is no pneumothorax. Soft tissue planes and included osseous structures are non-suspicious. Bilateral nipple piercings. Surgical clips in the neck  most consistent with thyroidectomy.  IMPRESSION: No acute cardiopulmonary process ; normal chest radiograph.   Electronically Signed   By: Elon Alas   On: 02/02/2014 01:22     EKG: Independently reviewed.    Assessment/Plan:   33 y.o. female with   Principal Problem:   1.   DKA (diabetic ketoacidoses)/  DKA, type 1   DKA Protocol   IVFs   Monitor Electrolytes Replete PRN   Active Problems:   2.    Uncontrolled type 1 diabetes mellitus   On Lantus 28 units Sq in AM and 20 ubnits SQ in PM    with and SSI     3.   Abdominal pain, generalized- from DKA   Fentanyl 50 mcg IV q 2 hrs PRN     4.   Nausea and vomiting- from DKA   PRN IV Zofran       5.   Leukocytosis- No Site of infection, Probable Stres Rxn   Monitor Trend     6.   Thyroid disease     Check TSH       7.   DVT Prophylaxis   Lovenox     Code Status:    FULL CODE   Family Communication:    No Family Present Disposition Plan:      Inpatient  ICU  Time spent:   75 MInutes  [facility] Pager [phone]   If [place] Please Contact the Day Rounding Team MD for [facility]  If 7PM-7AM, Please Contact Night-Floor Coverage  [email or web address] Password [email or web address] 02/02/2014, 2:03 AM

## 2014-02-02 NOTE — Progress Notes (Signed)
Patient seen and examined  Bicarbonate slowly improving from 8-15  Anion gap improved from 28-17  Continue nothing by mouth until anion gap is closed  Then transition to NovoLog and Lantus, and discontinue insulin drip 2 hours later

## 2014-02-02 NOTE — ED Notes (Signed)
ED mini Lab notified of patient needing blood work and willing to have blood drawn.

## 2014-02-02 NOTE — ED Notes (Signed)
Pt transported to radiology via stretcher with tech.  

## 2014-02-02 NOTE — ED Notes (Signed)
Monico Blitz, PA notified of difficulty with IV insertion and blood drawl and need to ultrasound IV. Pisciotta, [employer] notified of multiple unsuccessful IV attempts. Current nursing staff unable to do ultrasound IV placement. Pisciotta, PA telling nursing to obtain IV access.

## 2014-02-02 NOTE — ED Notes (Signed)
Jenkins, MD at bedside.  

## 2014-02-02 NOTE — ED Notes (Signed)
PA made aware of patient I-Stat Chem 8 results.

## 2014-02-02 NOTE — ED Notes (Signed)
Pisciotta, PA and Jenkins, MD notified pt requesting pain medication and bicarb due to pain and due to blood gas results. No new orders given.

## 2014-02-03 ENCOUNTER — Encounter (HOSPITAL_COMMUNITY): Payer: Self-pay | Admitting: *Deleted

## 2014-02-03 LAB — BASIC METABOLIC PANEL
ANION GAP: 14 (ref 5–15)
Anion gap: 11 (ref 5–15)
Anion gap: 14 (ref 5–15)
BUN: 3 mg/dL — ABNORMAL LOW (ref 6–23)
BUN: 5 mg/dL — AB (ref 6–23)
BUN: 7 mg/dL (ref 6–23)
CO2: 18 mEq/L — ABNORMAL LOW (ref 19–32)
CO2: 20 mEq/L (ref 19–32)
CO2: 22 mEq/L (ref 19–32)
Calcium: 7.1 mg/dL — ABNORMAL LOW (ref 8.4–10.5)
Calcium: 7.2 mg/dL — ABNORMAL LOW (ref 8.4–10.5)
Calcium: 8.2 mg/dL — ABNORMAL LOW (ref 8.4–10.5)
Chloride: 104 mEq/L (ref 96–112)
Chloride: 104 mEq/L (ref 96–112)
Chloride: 106 mEq/L (ref 96–112)
Creatinine, Ser: 0.51 mg/dL (ref 0.50–1.10)
Creatinine, Ser: 0.51 mg/dL (ref 0.50–1.10)
Creatinine, Ser: 0.51 mg/dL (ref 0.50–1.10)
GFR calc Af Amer: >90 mL/min (ref >90)
GFR calc Af Amer: >90 mL/min (ref >90)
GFR calc non Af Amer: >90 mL/min (ref >90)
GFR calc non Af Amer: >90 mL/min (ref >90)
GLUCOSE: 166 mg/dL — AB (ref 70–99)
Glucose, Bld: 212 mg/dL — ABNORMAL HIGH (ref 70–99)
Glucose, Bld: 123 mg/dL — ABNORMAL HIGH (ref 70–99)
POTASSIUM: 4.3 meq/L (ref 3.7–5.3)
Potassium: 2.9 mEq/L — CL (ref 3.7–5.3)
Potassium: 3.5 mEq/L — ABNORMAL LOW (ref 3.7–5.3)
SODIUM: 138 meq/L (ref 137–147)
Sodium: 137 mEq/L (ref 137–147)
Sodium: 138 mEq/L (ref 137–147)

## 2014-02-03 LAB — HEPATIC FUNCTION PANEL
ALT: 18 U/L (ref 0–35)
AST: 48 U/L — ABNORMAL HIGH (ref 0–37)
Albumin: 3.2 g/dL — ABNORMAL LOW (ref 3.5–5.2)
Alkaline Phosphatase: 53 U/L (ref 39–117)
Total Bilirubin: <0.2 mg/dL — ABNORMAL LOW (ref 0.3–1.2)
Total Protein: 6 g/dL (ref 6.0–8.3)

## 2014-02-03 LAB — CBC
HCT: 29.6 % — ABNORMAL LOW (ref 36.0–46.0)
HEMOGLOBIN: 10.4 g/dL — AB (ref 12.0–15.0)
MCH: 31.5 pg (ref 26.0–34.0)
MCHC: 35.1 g/dL (ref 30.0–36.0)
MCV: 89.7 fL (ref 78.0–100.0)
PLATELETS: 193 10*3/uL (ref 150–400)
RBC: 3.3 MIL/uL — ABNORMAL LOW (ref 3.87–5.11)
RDW: 12.5 % (ref 11.5–15.5)
WBC: 6.5 10*3/uL (ref 4.0–10.5)

## 2014-02-03 LAB — GLUCOSE, CAPILLARY
GLUCOSE-CAPILLARY: 120 mg/dL — AB (ref 70–99)
GLUCOSE-CAPILLARY: 144 mg/dL — AB (ref 70–99)
GLUCOSE-CAPILLARY: 152 mg/dL — AB (ref 70–99)
GLUCOSE-CAPILLARY: 155 mg/dL — AB (ref 70–99)
GLUCOSE-CAPILLARY: 172 mg/dL — AB (ref 70–99)
GLUCOSE-CAPILLARY: 179 mg/dL — AB (ref 70–99)
GLUCOSE-CAPILLARY: 188 mg/dL — AB (ref 70–99)
GLUCOSE-CAPILLARY: 205 mg/dL — AB (ref 70–99)
Glucose-Capillary: 134 mg/dL — ABNORMAL HIGH (ref 70–99)
Glucose-Capillary: 121 mg/dL — ABNORMAL HIGH (ref 70–99)
Glucose-Capillary: 125 mg/dL — ABNORMAL HIGH (ref 70–99)
Glucose-Capillary: 130 mg/dL — ABNORMAL HIGH (ref 70–99)
Glucose-Capillary: 119 mg/dL — ABNORMAL HIGH (ref 70–99)
Glucose-Capillary: 175 mg/dL — ABNORMAL HIGH (ref 70–99)
Glucose-Capillary: 146 mg/dL — ABNORMAL HIGH (ref 70–99)
Glucose-Capillary: 203 mg/dL — ABNORMAL HIGH (ref 70–99)
Glucose-Capillary: 160 mg/dL — ABNORMAL HIGH (ref 70–99)

## 2014-02-03 LAB — HEMOGLOBIN A1C
HEMOGLOBIN A1C: 11 % — AB (ref <5.7)
MEAN PLASMA GLUCOSE: 269 mg/dL — AB (ref <117)

## 2014-02-03 LAB — LIPASE, BLOOD: Lipase: 7 U/L — ABNORMAL LOW (ref 11–59)

## 2014-02-03 LAB — MAGNESIUM: MAGNESIUM: 1.3 mg/dL — AB (ref 1.5–2.5)

## 2014-02-03 MED ORDER — MAGNESIUM SULFATE 50 % IJ SOLN
3.0000 g | Freq: Once | INTRAVENOUS | Status: AC
  Administered 2014-02-03: 3 g via INTRAVENOUS
  Filled 2014-02-03: qty 6

## 2014-02-03 MED ORDER — INSULIN GLARGINE 100 UNIT/ML ~~LOC~~ SOLN
5.0000 [IU] | Freq: Once | SUBCUTANEOUS | Status: AC
  Administered 2014-02-03: 5 [IU] via SUBCUTANEOUS
  Filled 2014-02-03: qty 0.05

## 2014-02-03 MED ORDER — POTASSIUM CHLORIDE CRYS ER 20 MEQ PO TBCR
40.0000 meq | EXTENDED_RELEASE_TABLET | ORAL | Status: AC
  Administered 2014-02-03 (×2): 40 meq via ORAL
  Filled 2014-02-03 (×2): qty 2

## 2014-02-03 MED ORDER — INSULIN ASPART 100 UNIT/ML ~~LOC~~ SOLN
0.0000 [IU] | Freq: Three times a day (TID) | SUBCUTANEOUS | Status: DC
  Administered 2014-02-03: 2 [IU] via SUBCUTANEOUS
  Administered 2014-02-03: 1 [IU] via SUBCUTANEOUS
  Administered 2014-02-04: 2 [IU] via SUBCUTANEOUS
  Administered 2014-02-04: 3 [IU] via SUBCUTANEOUS

## 2014-02-03 MED ORDER — SODIUM CHLORIDE 0.9 % IV SOLN
INTRAVENOUS | Status: DC
  Administered 2014-02-03 (×2): via INTRAVENOUS
  Filled 2014-02-03 (×6): qty 1000

## 2014-02-03 MED ORDER — POTASSIUM CHLORIDE CRYS ER 20 MEQ PO TBCR
40.0000 meq | EXTENDED_RELEASE_TABLET | Freq: Once | ORAL | Status: AC
  Administered 2014-02-03: 40 meq via ORAL
  Filled 2014-02-03: qty 2

## 2014-02-03 MED ORDER — INSULIN GLARGINE 100 UNIT/ML ~~LOC~~ SOLN
28.0000 [IU] | Freq: Every day | SUBCUTANEOUS | Status: DC
  Administered 2014-02-04: 28 [IU] via SUBCUTANEOUS
  Filled 2014-02-03: qty 0.28

## 2014-02-03 MED ORDER — INSULIN GLARGINE 100 UNIT/ML ~~LOC~~ SOLN
20.0000 [IU] | Freq: Every day | SUBCUTANEOUS | Status: DC
  Administered 2014-02-03: 20 [IU] via SUBCUTANEOUS
  Filled 2014-02-03: qty 0.2

## 2014-02-03 MED ORDER — MAGNESIUM SULFATE 4 GM/100ML IV SOLN: 4.0000 g | Freq: Once | INTRAVENOUS | Status: DC

## 2014-02-03 NOTE — Progress Notes (Signed)
Attempted calling report but per Manuela Schwartz, Conservation officer, historic buildings,  charge rn said they are in the middle of discharging patients and that  upon doing so charge rn would call this rn to get report. Vwilliams,rn.

## 2014-02-03 NOTE — Progress Notes (Signed)
[facility] PROGRESS NOTE  [name] WKG:[id number] DOB: 1980-10-11 DOA: 02/01/2014 PCP: [name], MD  Assessment/Plan: Principal Problem:   DKA (diabetic ketoacidoses) Active Problems:   Nausea and vomiting   DKA, type 1   Leukocytosis   Thyroid disease   Uncontrolled type 1 diabetes mellitus   Abdominal pain, generalized    Diabetic ketoacidosis Resolved, continue Lantus and SSI  Abdominal pain secondary to DKA Will check lipase and liver function  Gastroparesis We'll start the patient on Reglan  Hypokalemia hypomagnesemia replete Continue telemetry   Code Status: full Family Communication: family updated about patient's clinical progress Disposition Plan:  Transfer to telemetry Brief narrative: HPI: [name] is a 33 y.o. female with Brittle Type 1 DM who presents to the ED with complaints of ABD pain Nausea and Vomiting `since the AM. When she was evaluated in the ED she ws found to have a Glucose of 577, and an Anion Gap of 22. She was placed on the DKA protocol and referred for medical admission.   Consultants:  None  Procedures:  None  Antibiotics: None  HPI/Subjective: Complaining of abdominal pain, tolerated clear broth, would like to advance diet  Objective: Filed Vitals:   02/03/14 0400 02/03/14 0500 02/03/14 0600 02/03/14 1000  BP: 102/73 104/67 104/73 111/69  Pulse: 96 92 94 98  Temp: 99.3 F (37.4 C)     TempSrc: Oral     Resp: 16 18 16 24   Height:      Weight: 58.1 kg (128 lb 1.4 oz)     SpO2: 97% 98% 99% 98%    Intake/Output Summary (Last 24 hours) at 02/03/14 1148 Last data filed at 02/03/14 0600  Gross per 24 hour  Intake 8198.09 ml  Output   2550 ml  Net 5648.09 ml    Exam:  General: alert & oriented x 3 In NAD  Cardiovascular: RRR, nl S1 s2  Respiratory: Decreased breath sounds at the bases, scattered rhonchi, no crackles  Abdomen: soft +BS NT/ND, no masses palpable  Extremities: No cyanosis  and no edema      Data Reviewed: Basic Metabolic Panel:  Recent Labs Lab 02/02/14 1605 02/02/14 1922 02/02/14 2339 02/03/14 0406 02/03/14 1051  NA 134* 140 138 137 138  K 3.9 4.7 3.5* 2.9* 4.3  CL 100 107 106 104 104  CO2 17* 14* 18* 22 20  GLUCOSE 149* 133* 166* 212* 123*  BUN 10 10 7  5* 3*  CREATININE 0.55 0.56 0.51 0.51 0.51  CALCIUM 7.9* 7.9* 7.1* 7.2* 8.2*  MG  --   --   --   --  1.3*    Liver Function Tests: No results for input(s): AST, ALT, ALKPHOS, BILITOT, PROT, ALBUMIN in the last 168 hours. No results for input(s): LIPASE, AMYLASE in the last 168 hours. No results for input(s): AMMONIA in the last 168 hours.  CBC:  Recent Labs Lab 02/02/14 0008 02/02/14 0019 02/03/14 0406  WBC 18.0*  --  6.5  NEUTROABS 16.0*  --   --   HGB 14.7 16.7* 10.4*  HCT 43.9 49.0* 29.6*  MCV 94.2  --  89.7  PLT 330  --  193    Cardiac Enzymes: No results for input(s): CKTOTAL, CKMB, CKMBINDEX, TROPONINI in the last 168 hours. BNP (last 3 results) No results for input(s): PROBNP in the last 8760 hours.   CBG:  Recent Labs Lab 02/02/14 1533 02/02/14 1642 02/02/14 1759 02/02/14 1857 02/02/14 1951  GLUCAP 121* 220* 134* 121*  125*    Recent Results (from the past 240 hour(s))  MRSA PCR Screening     Status: None   Collection Time: 02/02/14  3:25 AM  Result Value Ref Range Status   MRSA by PCR NEGATIVE NEGATIVE Final    Comment:        The GeneXpert MRSA Assay (FDA approved for NASAL specimens only), is one component of a comprehensive MRSA colonization surveillance program. It is not intended to diagnose MRSA infection nor to guide or monitor treatment for MRSA infections.      Studies: Dg Chest 2 View  02/02/2014   CLINICAL DATA:  Severe chest and abdominal pain, DKA.  EXAM: CHEST  2 VIEW  COMPARISON:  Chest radiograph May 18, 2013  FINDINGS: Cardiomediastinal silhouette is unremarkable. The lungs are clear without pleural effusions or focal  consolidations. Trachea projects midline and there is no pneumothorax. Soft tissue planes and included osseous structures are non-suspicious. Bilateral nipple piercings. Surgical clips in the neck most consistent with thyroidectomy.  IMPRESSION: No acute cardiopulmonary process ; normal chest radiograph.   Electronically Signed   By: Elon Alas   On: 02/02/2014 01:22    Scheduled Meds: . enoxaparin (LOVENOX) injection  40 mg Subcutaneous Q24H  . insulin aspart  0-9 Units Subcutaneous TID WC  . insulin glargine  20 Units Subcutaneous Daily  . magnesium sulfate 1 - 4 g bolus IVPB  3 g Intravenous Once  . pantoprazole  40 mg Oral Daily   Continuous Infusions: . 0.9 % sodium chloride with kcl 75 mL/hr at 02/03/14 0746    Principal Problem:   DKA (diabetic ketoacidoses) Active Problems:   Nausea and vomiting   DKA, type 1   Leukocytosis   Thyroid disease   Uncontrolled type 1 diabetes mellitus   Abdominal pain, generalized    Time spent: 40 minutes   [facility] Pager [phone]. If 7PM-7AM, please contact night-coverage at [email or web address], password [facility] 02/03/2014, 11:48 AM  LOS: 2 days

## 2014-02-03 NOTE — Progress Notes (Signed)
Repot called and given to [place], rn Gates. Patient now in the process of being transferred to floor. Vwilliams,rn.

## 2014-02-03 NOTE — Progress Notes (Signed)
MD called and need for telemetry bed questioned based on pt's current status. Pt is stable with no abnormal rhythms. MD said it's ok for pt to be transferred to a regular bed instead of telemetry. Charge nurse made aware. Vw, rn.

## 2014-02-03 NOTE — Progress Notes (Signed)
Pt transferred to 1513. Left unit in wheelchair pushed by nurse tech accompanied by mother and daughter. Left in good condition. No concerns voiced. Vwilliams,rn.

## 2014-02-04 LAB — BASIC METABOLIC PANEL
ANION GAP: 10 (ref 5–15)
BUN: <3 mg/dL — ABNORMAL LOW (ref 6–23)
CALCIUM: 8.9 mg/dL (ref 8.4–10.5)
CO2: 22 mEq/L (ref 19–32)
Chloride: 104 mEq/L (ref 96–112)
Creatinine, Ser: 0.5 mg/dL (ref 0.50–1.10)
Glucose, Bld: 221 mg/dL — ABNORMAL HIGH (ref 70–99)
Potassium: 4.9 mEq/L (ref 3.7–5.3)
SODIUM: 136 meq/L — AB (ref 137–147)

## 2014-02-04 LAB — GLUCOSE, CAPILLARY
GLUCOSE-CAPILLARY: 222 mg/dL — AB (ref 70–99)
Glucose-Capillary: 162 mg/dL — ABNORMAL HIGH (ref 70–99)

## 2014-02-04 LAB — MAGNESIUM: MAGNESIUM: 1.8 mg/dL (ref 1.5–2.5)

## 2014-02-04 MED ORDER — METOCLOPRAMIDE HCL 10 MG PO TABS: 10.0000 mg | ORAL_TABLET | Freq: Three times a day (TID) | ORAL | Status: DC

## 2014-02-04 MED ORDER — PANTOPRAZOLE SODIUM 40 MG PO TBEC: 40.0000 mg | DELAYED_RELEASE_TABLET | Freq: Every day | ORAL | Status: DC

## 2014-02-04 MED ORDER — HYDROCODONE-ACETAMINOPHEN 5-325 MG PO TABS: 1.0000 | ORAL_TABLET | Freq: Four times a day (QID) | ORAL | Status: DC | PRN

## 2014-02-04 NOTE — Discharge Summary (Signed)
Physician Discharge Summary  [name] MRN: [id number] DOB/AGE: 07-03-1980 33 y.o.  PCP: [name], MD   Admit date: 02/01/2014 Discharge date: 02/04/2014  Discharge Diagnoses:     DKA (diabetic ketoacidoses) Active Problems:   Nausea and vomiting   DKA, type 1   Leukocytosis   Thyroid disease   Uncontrolled type 1 diabetes mellitus   Abdominal pain, generalized   follow-up recommendations Follow-up with PCP in 5-7 days Follow-up BMP, magnesium, CBC in one week     Medication List    TAKE these medications        alum & mag hydroxide-simeth 200-200-20 MG/5ML suspension  Commonly known as:  MAALOX/MYLANTA  Take 30 mLs by mouth every 6 (six) hours as needed for indigestion or heartburn.     amitriptyline 50 MG tablet  Commonly known as:  ELAVIL  Take 50 mg by mouth at bedtime.     cloNIDine 0.1 MG tablet  Commonly known as:  CATAPRES  Take 0.1 mg by mouth daily.     famotidine 20 MG tablet  Commonly known as:  PEPCID  Take 1 tablet (20 mg total) by mouth 2 (two) times daily.     gabapentin 100 MG capsule  Commonly known as:  NEURONTIN  Take 200 mg by mouth at bedtime.     GLUCAGON EMERGENCY 1 MG injection  Generic drug:  glucagon  Inject 1 mg into the muscle once as needed (severe hypoglycemia).     HYDROcodone-acetaminophen 5-325 MG per tablet  Commonly known as:  NORCO/VICODIN  Take 1 tablet by mouth every 6 (six) hours as needed for moderate pain.     insulin aspart 100 UNIT/ML injection  Commonly known as:  novoLOG  - Inject 1-3 Units into the skin 3 (three) times daily with meals. CBG < 70: implement hypoglycemia protocol  - CBG > 400: call MD   -   - CBG 70 - 120: 0 units  - CBG 121 - 150: 3 units  - CBG 151 - 200: 4 units  - CBG 201 - 250: 7 units  - CBG 251 - 300: 11 units  - CBG 301 - 350: 15 units  - CBG 351 - 400: 20 units  - CBG > 400: call MD     insulin glargine 100 UNIT/ML injection  Commonly known as:   LANTUS  Inject 0.28 mLs (28 Units total) into the skin every morning.     lisinopril 2.5 MG tablet  Commonly known as:  PRINIVIL,ZESTRIL  Take 2.5 mg by mouth daily.     lovastatin 10 MG tablet  Commonly known as:  MEVACOR  Take 10 mg by mouth at bedtime.     metoCLOPramide 10 MG tablet  Commonly known as:  REGLAN  Take 1 tablet (10 mg total) by mouth 4 (four) times daily -  before meals and at bedtime.     multivitamin with minerals Tabs tablet  Take 1 tablet by mouth every morning. Diabetic support vitamin     nystatin-triamcinolone cream  Commonly known as:  MYCOLOG II  Apply 1 application topically 2 (two) times daily.     ondansetron 8 MG disintegrating tablet  Commonly known as:  ZOFRAN-ODT  Take 8 mg by mouth every 8 (eight) hours as needed for nausea or vomiting.     pantoprazole 40 MG tablet  Commonly known as:  PROTONIX  Take 1 tablet (40 mg total) by mouth daily.        Discharge  Condition: Stable   Disposition: 01-Home or Self Care   Consults none  Significant Diagnostic Studies: Dg Chest 2 View  02/02/2014   CLINICAL DATA:  Severe chest and abdominal pain, DKA.  EXAM: CHEST  2 VIEW  COMPARISON:  Chest radiograph May 18, 2013  FINDINGS: Cardiomediastinal silhouette is unremarkable. The lungs are clear without pleural effusions or focal consolidations. Trachea projects midline and there is no pneumothorax. Soft tissue planes and included osseous structures are non-suspicious. Bilateral nipple piercings. Surgical clips in the neck most consistent with thyroidectomy.  IMPRESSION: No acute cardiopulmonary process ; normal chest radiograph.   Electronically Signed   By: Elon Alas   On: 02/02/2014 01:22     Microbiology: Recent Results (from the past 240 hour(s))  MRSA PCR Screening     Status: None   Collection Time: 02/02/14  3:25 AM  Result Value Ref Range Status   MRSA by PCR NEGATIVE NEGATIVE Final    Comment:        The GeneXpert MRSA Assay  (FDA approved for NASAL specimens only), is one component of a comprehensive MRSA colonization surveillance program. It is not intended to diagnose MRSA infection nor to guide or monitor treatment for MRSA infections.      Labs: Results for orders placed or performed during the hospital encounter of 02/01/14 (from the past 48 hour(s))  Glucose, capillary     Status: Abnormal   Collection Time: 02/02/14 12:49 PM  Result Value Ref Range   Glucose-Capillary 124 (H) 70 - 99 mg/dL  Basic metabolic panel (stat then every 4 hours)     Status: Abnormal   Collection Time: 02/02/14 12:52 PM  Result Value Ref Range   Sodium 134 (L) 137 - 147 mEq/L   Potassium 3.9 3.7 - 5.3 mEq/L   Chloride 103 96 - 112 mEq/L   CO2 17 (L) 19 - 32 mEq/L   Glucose, Bld 127 (H) 70 - 99 mg/dL   BUN 12 6 - 23 mg/dL   Creatinine, Ser 0.54 0.50 - 1.10 mg/dL   Calcium 8.0 (L) 8.4 - 10.5 mg/dL   GFR calc non Af Amer >90 >90 mL/min   GFR calc Af Amer >90 >90 mL/min    Comment: (NOTE) The eGFR has been calculated using the CKD EPI equation. This calculation has not been validated in all clinical situations. eGFR's persistently <90 mL/min signify possible Chronic Kidney Disease.    Anion gap 14 5 - 15  Glucose, capillary     Status: Abnormal   Collection Time: 02/02/14  1:52 PM  Result Value Ref Range   Glucose-Capillary 113 (H) 70 - 99 mg/dL  Glucose, capillary     Status: Abnormal   Collection Time: 02/02/14  2:46 PM  Result Value Ref Range   Glucose-Capillary 118 (H) 70 - 99 mg/dL   Comment 1 Documented in Chart    Comment 2 Notify RN   Glucose, capillary     Status: Abnormal   Collection Time: 02/02/14  3:33 PM  Result Value Ref Range   Glucose-Capillary 121 (H) 70 - 99 mg/dL   Comment 1 Documented in Chart    Comment 2 Notify RN   Basic metabolic panel (stat then every 4 hours)     Status: Abnormal   Collection Time: 02/02/14  4:05 PM  Result Value Ref Range   Sodium 134 (L) 137 - 147 mEq/L    Potassium 3.9 3.7 - 5.3 mEq/L   Chloride 100 96 - 112 mEq/L  CO2 17 (L) 19 - 32 mEq/L   Glucose, Bld 149 (H) 70 - 99 mg/dL   BUN 10 6 - 23 mg/dL   Creatinine, Ser 0.55 0.50 - 1.10 mg/dL   Calcium 7.9 (L) 8.4 - 10.5 mg/dL   GFR calc non Af Amer >90 >90 mL/min   GFR calc Af Amer >90 >90 mL/min    Comment: (NOTE) The eGFR has been calculated using the CKD EPI equation. This calculation has not been validated in all clinical situations. eGFR's persistently <90 mL/min signify possible Chronic Kidney Disease.    Anion gap 17 (H) 5 - 15  Glucose, capillary     Status: Abnormal   Collection Time: 02/02/14  4:42 PM  Result Value Ref Range   Glucose-Capillary 220 (H) 70 - 99 mg/dL  Glucose, capillary     Status: Abnormal   Collection Time: 02/02/14  5:59 PM  Result Value Ref Range   Glucose-Capillary 134 (H) 70 - 99 mg/dL  Glucose, capillary     Status: Abnormal   Collection Time: 02/02/14  6:57 PM  Result Value Ref Range   Glucose-Capillary 121 (H) 70 - 99 mg/dL  Basic metabolic panel (stat then every 4 hours)     Status: Abnormal   Collection Time: 02/02/14  7:22 PM  Result Value Ref Range   Sodium 140 137 - 147 mEq/L   Potassium 4.7 3.7 - 5.3 mEq/L    Comment: SLIGHT HEMOLYSIS HEMOLYSIS AT THIS LEVEL MAY AFFECT RESULT DELTA CHECK NOTED    Chloride 107 96 - 112 mEq/L   CO2 14 (L) 19 - 32 mEq/L   Glucose, Bld 133 (H) 70 - 99 mg/dL   BUN 10 6 - 23 mg/dL   Creatinine, Ser 0.56 0.50 - 1.10 mg/dL   Calcium 7.9 (L) 8.4 - 10.5 mg/dL   GFR calc non Af Amer >90 >90 mL/min   GFR calc Af Amer >90 >90 mL/min    Comment: (NOTE) The eGFR has been calculated using the CKD EPI equation. This calculation has not been validated in all clinical situations. eGFR's persistently <90 mL/min signify possible Chronic Kidney Disease.    Anion gap 19 (H) 5 - 15  Glucose, capillary     Status: Abnormal   Collection Time: 02/02/14  7:51 PM  Result Value Ref Range   Glucose-Capillary 125 (H) 70 -  99 mg/dL  Glucose, capillary     Status: Abnormal   Collection Time: 02/02/14  8:53 PM  Result Value Ref Range   Glucose-Capillary 130 (H) 70 - 99 mg/dL  Glucose, capillary     Status: Abnormal   Collection Time: 02/02/14  9:53 PM  Result Value Ref Range   Glucose-Capillary 119 (H) 70 - 99 mg/dL  Glucose, capillary     Status: Abnormal   Collection Time: 02/02/14 10:55 PM  Result Value Ref Range   Glucose-Capillary 152 (H) 70 - 99 mg/dL  Basic metabolic panel (stat then every 4 hours)     Status: Abnormal   Collection Time: 02/02/14 11:39 PM  Result Value Ref Range   Sodium 138 137 - 147 mEq/L   Potassium 3.5 (L) 3.7 - 5.3 mEq/L    Comment: DELTA CHECK NOTED   Chloride 106 96 - 112 mEq/L   CO2 18 (L) 19 - 32 mEq/L   Glucose, Bld 166 (H) 70 - 99 mg/dL   BUN 7 6 - 23 mg/dL   Creatinine, Ser 0.51 0.50 - 1.10 mg/dL   Calcium 7.1 (L)  8.4 - 10.5 mg/dL   GFR calc non Af Amer >90 >90 mL/min   GFR calc Af Amer >90 >90 mL/min    Comment: (NOTE) The eGFR has been calculated using the CKD EPI equation. This calculation has not been validated in all clinical situations. eGFR's persistently <90 mL/min signify possible Chronic Kidney Disease.    Anion gap 14 5 - 15  Glucose, capillary     Status: Abnormal   Collection Time: 02/03/14 12:04 AM  Result Value Ref Range   Glucose-Capillary 144 (H) 70 - 99 mg/dL  Glucose, capillary     Status: Abnormal   Collection Time: 02/03/14  1:01 AM  Result Value Ref Range   Glucose-Capillary 155 (H) 70 - 99 mg/dL  Glucose, capillary     Status: Abnormal   Collection Time: 02/03/14  2:02 AM  Result Value Ref Range   Glucose-Capillary 188 (H) 70 - 99 mg/dL  Glucose, capillary     Status: Abnormal   Collection Time: 02/03/14  3:06 AM  Result Value Ref Range   Glucose-Capillary 179 (H) 70 - 99 mg/dL   Comment 1 Documented in Chart    Comment 2 Notify RN   Basic metabolic panel     Status: Abnormal   Collection Time: 02/03/14  4:06 AM  Result Value  Ref Range   Sodium 137 137 - 147 mEq/L   Potassium 2.9 (LL) 3.7 - 5.3 mEq/L    Comment: DELTA CHECK NOTED REPEATED TO VERIFY CRITICAL RESULT CALLED TO, READ BACK BY AND VERIFIED WITH: MORGAN,M/2W @0512  ON 02/03/14 BY KARCZEWSKI,S.    Chloride 104 96 - 112 mEq/L   CO2 22 19 - 32 mEq/L   Glucose, Bld 212 (H) 70 - 99 mg/dL   BUN 5 (L) 6 - 23 mg/dL   Creatinine, Ser 0.51 0.50 - 1.10 mg/dL   Calcium 7.2 (L) 8.4 - 10.5 mg/dL   GFR calc non Af Amer >90 >90 mL/min   GFR calc Af Amer >90 >90 mL/min    Comment: (NOTE) The eGFR has been calculated using the CKD EPI equation. This calculation has not been validated in all clinical situations. eGFR's persistently <90 mL/min signify possible Chronic Kidney Disease.    Anion gap 11 5 - 15  CBC     Status: Abnormal   Collection Time: 02/03/14  4:06 AM  Result Value Ref Range   WBC 6.5 4.0 - 10.5 K/uL   RBC 3.30 (L) 3.87 - 5.11 MIL/uL   Hemoglobin 10.4 (L) 12.0 - 15.0 g/dL    Comment: DELTA CHECK NOTED REPEATED TO VERIFY    HCT 29.6 (L) 36.0 - 46.0 %   MCV 89.7 78.0 - 100.0 fL   MCH 31.5 26.0 - 34.0 pg   MCHC 35.1 30.0 - 36.0 g/dL   RDW 12.5 11.5 - 15.5 %   Platelets 193 150 - 400 K/uL    Comment: DELTA CHECK NOTED SPECIMEN CHECKED FOR CLOTS REPEATED TO VERIFY PLATELET COUNT CONFIRMED BY SMEAR   Glucose, capillary     Status: Abnormal   Collection Time: 02/03/14  4:19 AM  Result Value Ref Range   Glucose-Capillary 205 (H) 70 - 99 mg/dL  Glucose, capillary     Status: Abnormal   Collection Time: 02/03/14  5:25 AM  Result Value Ref Range   Glucose-Capillary 175 (H) 70 - 99 mg/dL  Glucose, capillary     Status: Abnormal   Collection Time: 02/03/14  8:20 AM  Result Value Ref Range  Glucose-Capillary 120 (H) 70 - 99 mg/dL  Hemoglobin A1c     Status: Abnormal   Collection Time: 02/03/14 10:50 AM  Result Value Ref Range   Hgb A1c MFr Bld 11.0 (H) <5.7 %    Comment: (NOTE)                                                                        According to the ADA Clinical Practice Recommendations for 2011, when HbA1c is used as a screening test:  >=6.5%   Diagnostic of Diabetes Mellitus           (if abnormal result is confirmed) 5.7-6.4%   Increased risk of developing Diabetes Mellitus References:Diagnosis and Classification of Diabetes Mellitus,Diabetes NTIR,4431,54(MGQQP 1):S62-S69 and Standards of Medical Care in         Diabetes - 2011,Diabetes YPPJ,0932,67 (Suppl 1):S11-S61.    Mean Plasma Glucose 269 (H) <117 mg/dL    Comment: Performed at [facility] metabolic panel     Status: Abnormal   Collection Time: 02/03/14 10:51 AM  Result Value Ref Range   Sodium 138 137 - 147 mEq/L   Potassium 4.3 3.7 - 5.3 mEq/L    Comment: DELTA CHECK NOTED   Chloride 104 96 - 112 mEq/L   CO2 20 19 - 32 mEq/L   Glucose, Bld 123 (H) 70 - 99 mg/dL   BUN 3 (L) 6 - 23 mg/dL   Creatinine, Ser 0.51 0.50 - 1.10 mg/dL   Calcium 8.2 (L) 8.4 - 10.5 mg/dL   GFR calc non Af Amer >90 >90 mL/min   GFR calc Af Amer >90 >90 mL/min    Comment: (NOTE) The eGFR has been calculated using the CKD EPI equation. This calculation has not been validated in all clinical situations. eGFR's persistently <90 mL/min signify possible Chronic Kidney Disease.    Anion gap 14 5 - 15  Magnesium     Status: Abnormal   Collection Time: 02/03/14 10:51 AM  Result Value Ref Range   Magnesium 1.3 (L) 1.5 - 2.5 mg/dL  Lipase, blood     Status: Abnormal   Collection Time: 02/03/14 10:57 AM  Result Value Ref Range   Lipase 7 (L) 11 - 59 U/L  Hepatic function panel     Status: Abnormal   Collection Time: 02/03/14 10:57 AM  Result Value Ref Range   Total Protein 6.0 6.0 - 8.3 g/dL   Albumin 3.2 (L) 3.5 - 5.2 g/dL   AST 48 (H) 0 - 37 U/L   ALT 18 0 - 35 U/L   Alkaline Phosphatase 53 39 - 117 U/L   Total Bilirubin <0.2 (L) 0.3 - 1.2 mg/dL   Bilirubin, Direct <0.2 0.0 - 0.3 mg/dL   Indirect Bilirubin NOT CALCULATED 0.3 - 0.9 mg/dL   Glucose, capillary     Status: Abnormal   Collection Time: 02/03/14 12:16 PM  Result Value Ref Range   Glucose-Capillary 146 (H) 70 - 99 mg/dL  Glucose, capillary     Status: Abnormal   Collection Time: 02/03/14  4:29 PM  Result Value Ref Range   Glucose-Capillary 203 (H) 70 - 99 mg/dL   Comment 1 Documented in Chart    Comment 2 Notify RN  Glucose, capillary     Status: Abnormal   Collection Time: 02/03/14  5:33 PM  Result Value Ref Range   Glucose-Capillary 172 (H) 70 - 99 mg/dL   Comment 1 Notify RN   Glucose, capillary     Status: Abnormal   Collection Time: 02/03/14  9:09 PM  Result Value Ref Range   Glucose-Capillary 160 (H) 70 - 99 mg/dL   Comment 1 Documented in Chart    Comment 2 Notify RN   Magnesium     Status: None   Collection Time: 02/04/14  5:15 AM  Result Value Ref Range   Magnesium 1.8 1.5 - 2.5 mg/dL  Basic metabolic panel     Status: Abnormal   Collection Time: 02/04/14  5:15 AM  Result Value Ref Range   Sodium 136 (L) 137 - 147 mEq/L   Potassium 4.9 3.7 - 5.3 mEq/L   Chloride 104 96 - 112 mEq/L   CO2 22 19 - 32 mEq/L   Glucose, Bld 221 (H) 70 - 99 mg/dL   BUN <3 (L) 6 - 23 mg/dL   Creatinine, Ser 0.50 0.50 - 1.10 mg/dL   Calcium 8.9 8.4 - 10.5 mg/dL   GFR calc non Af Amer >90 >90 mL/min   GFR calc Af Amer >90 >90 mL/min    Comment: (NOTE) The eGFR has been calculated using the CKD EPI equation. This calculation has not been validated in all clinical situations. eGFR's persistently <90 mL/min signify possible Chronic Kidney Disease.    Anion gap 10 5 - 15  Glucose, capillary     Status: Abnormal   Collection Time: 02/04/14  7:22 AM  Result Value Ref Range   Glucose-Capillary 222 (H) 70 - 99 mg/dL   Comment 1 Notify RN   Glucose, capillary     Status: Abnormal   Collection Time: 02/04/14 11:26 AM  Result Value Ref Range   Glucose-Capillary 162 (H) 70 - 99 mg/dL   Comment 1 Notify RN      HPI [name] is a 33 y.o. female with  Brittle Type 1 DM who presents to the ED with complaints of ABD pain Nausea and Vomiting `since the AM. When she was evaluated in the ED she ws found to have a Glucose of 577, and an Anion Gap of 22. She was placed on the DKA protocol and referred for medical admission  HOSPITAL COURSE:  Diabetic ketoacidosis Resolved, continue Lantus and SSI  Abdominal pain secondary to DKA normal lipase and liver function  Gastroparesis We'll start the patient on Reglan, PPI  Hypokalemia hypomagnesemia repleted Recheck outpt   Discharge Exam:  Blood pressure 123/72, pulse 93, temperature 98.3 F (36.8 C), temperature source Oral, resp. rate 18, height 5' 5"  (1.651 m), weight 55.9 kg (123 lb 3.8 oz), last menstrual period 12/30/2013, SpO2 100 %.   General: alert & oriented x 3 In NAD   Cardiovascular: RRR, nl S1 s2   Respiratory: Decreased breath sounds at the bases, scattered rhonchi, no crackles   Abdomen: soft +BS NT/ND, no masses palpable   Extremities: No cyanosis and no edema       Discharge Instructions    Diet - low sodium heart healthy    Complete by:  As directed      Increase activity slowly    Complete by:  As directed            Follow-up Information    Follow up with [facility], [place]. Schedule an appointment as  soon as possible for a visit in 1 week.   Specialty:  Internal Medicine   Contact information:   [address], [place] [address] [phone]       Signed: Reyne Dumas 02/04/2014, 12:07 PM

## 2014-02-04 NOTE — Progress Notes (Signed)
Inpatient Diabetes Program Recommendations  AACE/ADA: New Consensus Statement on Inpatient Glycemic Control (2013)  Target Ranges:  Prepandial:   less than 140 mg/dL      Peak postprandial:   less than 180 mg/dL (1-2 hours)      Critically ill patients:  140 - 180 mg/dL   Reason for Visit: Diabetes Consult  [name] is a 33 y.o. female insulin dependent diabetic complaining of acute onset of diffuse abdominal pain nausea vomiting.  Patient states that his similar prior episodes of DKA, states that she has brittle diabetes. Found to be in DKA. Has missed several appts with Dr. Cruzita Lederer, endo. Does see Dr. Deforest Hoyles as PCP. Admitted with DKA. Patient with History of Type 1 Diabetes, Gastroparesis.   Home Insulin Regimen: Lantus 20 units daily Novolog 3-20 units tid with meals per SSI   **Patient well known to the Inpatient Diabetes Program. Multiple admissions for DKA.   **Per chart review, note that pt was seen by her Endocrinologist (Dr. Philemon Kingdom) on 11/27/2012. Dr. Cruzita Lederer stated in her notes that pt was using an insulin pump for approximately 2 years, however, during that time, pt had 18 hospital visits for hyperglycemia/DKA. Patient came off insulin pump in early 2014 and was restarted on basal/bolus regimen with injections.   Results for MYKAL, KIRCHMAN (MRN [id number]) as of 02/04/2014 16:18  Ref. Range 02/03/2014 16:29 02/03/2014 17:33 02/03/2014 21:09 02/04/2014 07:22 02/04/2014 11:26  Glucose-Capillary Latest Range: 70-99 mg/dL 203 (H) 172 (H) 160 (H) 222 (H) 162 (H)  Results for PRISHA, HILEY (MRN [id number]) as of 02/04/2014 16:18  Ref. Range 02/03/2014 10:50  Hgb A1c MFr Bld Latest Range: <5.7 % 11.0 (H)   Long discussion regarding importance of f/u with endocrinologist. Pt states "my blood sugars go crazy" around her menstrual cycle. Had to cancel appt with endo, and has not rescheduled. States she is frustrated with blood sugars, but knows she needs to f/u  with Dr. Cruzita Lederer. 2nd admission for DKA in 6 months.  Requesting Dr. Allyson Sabal make phone call to Dr. Cruzita Lederer to speed up referral. RN to call if pt can't get in to office within the week. Long discussion regarding importance of improving HgbA1C to reduce risk of long-term complications.  Thank you. Lorenda Peck, RD, LDN, CDE Inpatient Diabetes Coordinator [phone]

## 2014-02-06 ENCOUNTER — Ambulatory Visit: Payer: Medicaid Other | Admitting: Internal Medicine

## 2014-02-19 ENCOUNTER — Encounter: Payer: Self-pay | Admitting: Internal Medicine

## 2014-02-19 ENCOUNTER — Ambulatory Visit (INDEPENDENT_AMBULATORY_CARE_PROVIDER_SITE_OTHER): Payer: Medicaid Other | Admitting: Internal Medicine

## 2014-02-19 VITALS — BP 98/60 | HR 107 | Temp 98.0°F | Resp 12 | Wt 109.0 lb

## 2014-02-19 DIAGNOSIS — IMO0002 Reserved for concepts with insufficient information to code with codable children: Secondary | ICD-10-CM

## 2014-02-19 DIAGNOSIS — E1065 Type 1 diabetes mellitus with hyperglycemia: Secondary | ICD-10-CM

## 2014-02-19 DIAGNOSIS — E079 Disorder of thyroid, unspecified: Secondary | ICD-10-CM

## 2014-02-19 MED ORDER — GLUCAGON (RDNA) 1 MG IJ KIT: 1.0000 mg | PACK | Freq: Once | INTRAMUSCULAR | Status: DC | PRN

## 2014-02-19 MED ORDER — INSULIN GLARGINE 100 UNIT/ML ~~LOC~~ SOLN: 15.0000 [IU] | Freq: Every morning | SUBCUTANEOUS | Status: DC

## 2014-02-19 NOTE — Progress Notes (Signed)
Patient ID: [name], female   DOB: [birth year], 33 y.o.   MRN: [id number]  HPI: [name] is a 33 y.o.-year-old woman returning for f/u for DM1, dx 2005, uncontrolled, with complications (hyperglycemia +/- DKA + coma, severe hypoglycemia, gastroparesis, peripheral neuropathy). She has been admitted with DKA multiple times in the past, with glucose levels up to 1013 and CO2 down to <5, but 5x in the last year. Last visit >1 year ago.  Last hemoglobin A1c was: Lab Results  Component Value Date   HGBA1C 11.0* 02/03/2014   HGBA1C 12.5* 11/02/2013   HGBA1C 11.8* 05/18/2013   She was on an insulin pump: Omnipod x 2 years - she liked it much better than the insulin injection, but in 14 mo, she went to the hospital 18x. She was advised to stop it. Had first DKA episode 3 years ago while in early labor.   Pt is on a regimen of: - Lantus 20 units in am - Novolog 1 unit for 2 carb choice (30 g carbs) - usually 1-2 units per meal as she has N/V/D >> just started to take it after meals - NovoLog SSI - target 300: +1 unit, etc  She has more hyperglycemia in the week before her menses.  Pt checks her sugars 5-6 a day and they are (does not bring meter or log): - am: 60-180 >> 80-140 >> 90-160 >> 80-110 - before lunch: 200s >> 200-300s >> 100-180 >> upper 100s - before dinner: too high to read >> 300-400s >> 210 usually >> 140-200 - bedtime: >> 300-400s >> 280-290 >> 150-300s Lowest: 18 >> glucagon; highest: 389 in last month. Snacks in the pm: raw veggies and fruit. She eats gluten-free and mostly healthy foods.  Has lows, 90, but also can be ~100 around lunchtime; she has hypoglycemia awareness at now lower than 180, around 110!  She tells me that her 40 year old son saved her life twice, by performing CPR or injecting glucagon.  For exercise, she walks/other cardio.  Pt does not have chronic kidney disease, last BUN/creatinine was:  Lab Results  Component Value Date   BUN <3*  02/04/2014   CREATININE 0.50 02/04/2014  She is on Lisinopril.  Last set of lipids -  Per PCP  - will bring me records Lab Results  Component Value Date   CHOL 256* 08/18/2012   HDL 31* 08/18/2012   LDLCALC UNABLE TO CALCULATE IF TRIGLYCERIDE OVER 400 mg/dL 04/28/2012   TRIG 706* 04/28/2012   CHOLHDL 5.4 04/28/2012  On Lovastatin. Pt's last eye exam was in 08/2013: cataracts a little worse. No DR. Has numbness and tingling in her feet/hands - on neurontin She has nausea - gastroparesis. On Reglan and Protonix. She will see GI (Dr Collene Mares) soon.  Last TSH: Lab Results  Component Value Date   TSH 1.77 08/18/2012   She was on Levothyroxine in the past, but taken off several years ago.  I reviewed pt's medications, allergies, PMH, social hx, family hx, and changes were documented in the history of present illness. Otherwise, unchanged from my initial visit note. Also started Amitriptyline.  ROS: Constitutional: + weight loss, + fatigue, + both: subjective hyperthermia/hypothermia, + excessive urination, + poor sleep Eyes: + blurry vision, no xerophthalmia ENT: no sore throat, no nodules palpated in throat, no dysphagia/odynophagia, no hoarseness Cardiovascular: + CP/SOB/palpitations/leg swelling Respiratory: no cough/SOB Gastrointestinal: + acid reflux/+ N/+ V/+ D Musculoskeletal: + both: muscle/joint aches Skin: no rashes, + itching, + hair loss Neurological:  no tremors/numbness/tingling/dizziness, + HA  PE: BP 98/60 mmHg  Pulse 107  Temp(Src) 98 F (36.7 C) (Oral)  Resp 12  Wt 109 lb (49.442 kg)  SpO2 96%  LMP 12/30/2013 Wt Readings from Last 3 Encounters:  02/19/14 109 lb (49.442 kg)  02/03/14 123 lb 3.8 oz (55.9 kg)  11/03/13 136 lb 7.4 oz (61.9 kg)   Constitutional: thin, in NAD Eyes: PERRLA, EOMI, no exophthalmos ENT: moist mucous membranes, no thyromegaly, no cervical lymphadenopathy Cardiovascular: tachycardia, RR, No MRG Respiratory: CTA B Gastrointestinal:  abdomen soft, NT, ND, BS+ Musculoskeletal: no deformities, strength intact in all 4 Skin: moist, warm, no rashes Neurological: no tremor with outstretched hands, DTR normal in all 4  ASSESSMENT: 1. DM1, uncontrolled, with complications - h/o severe lows - recently 17 >> glucagon inj - highly fluctuating CBGs  - repeated admissions in the past for DKA/hyperglycemia - gastroparesis - on Zofran prn - peripheral neuropathy - on Neurontin  PLAN:  1.  Pt with long standing and very uncontrolled DM1, with complications and repeated admissions for DKA. Her sugars were better when I saw her last in 11/2013, but then they started to worsen and had 5 admission for DKA in the last year. She lost a lot of weight and still cannot eat well. Bshe is on a different regimen of insulin c/w at last visit >> more Lantus and less NovoLog. I advised her to decrease Lantus to avoid lows at night and before meals and increase NovoLog. She was advised to move NovoLog after meals b/c gastroparesis and I concur. Another option is to switch to Regular insulin which is better for gastroparesis pt, but it does not come in a pen. She agrees to try this if moving NovoLog after meals does not help. - I advised her to do the following: Patient Instructions  Decrease Lantus to 15 units in a.m. Increase mealtime Novolog: 1 unit for 1 carb choice (15g of carbs) Change the sliding scale: - 200-300: + 1 unit - 301-400: + 2 units - >400: + 3 units Stop Lisinopril. Please return in 1 month with your sugar log. Check sugars at least 4x a day. - I discussed with pt and her mother about a pump >> she kept having problems with this in the past and she had less admissions after she came off the pump, but ion last year her sugars are very fluctuating and I believe she would benefit from it. However, she needs to come to her appts and demonstrate compliance with the recommended insulin regimen and sugars checks. - I again explained that a  physiologic insulin regimen will have approximately same doses of basal and bolus per day.  - refilled glucagon - given a new sugar log - check at least 4x a day >> bring log at next visit - check TFTs today - we will stop Lisinopril since low BP and she does not have a h/o albuminuria >> will need to check MAU at next visit - she will d/w PCP to send me the Lipid levels, too. She is on Lovastatin. - return in 1 month with sugar log.   - time spent with the patient: 40 min, of which >50% was spent in reviewing her DM1 history since last visit, previous labs, discussing her hypo- and hyper-glycemic episodes, reviewing her insulin doses, and developing a plan to avoid hypo- an hyper-glycemia. She and her mother has several Qs which I addressed.  Office Visit on 02/19/2014  Component Date Value Ref  Range Status  . TSH 02/19/2014 5.039* 0.350 - 4.500 uIU/mL Final  . Free T4 02/19/2014 0.84  0.80 - 1.80 ng/dL Final  . T3, Free 02/19/2014 2.5  2.3 - 4.2 pg/mL Final   Msg sent: Dear Ms Chana Bode, The TSH is a little high, but I would like to recheck it when you come back to see if you need to start a low dose thyroid hormone. Sincerely, Philemon Kingdom MD

## 2014-02-19 NOTE — Patient Instructions (Signed)
Decrease Lantus to 15 units in a.m. Increase mealtime Novolog: 1 unit for 1 carb choice (15g of carbs) Change the sliding scale: - 200-300: + 1 unit - 301-400: + 2 units - >400: + 3 units  Stop Lisinopril.  Please return in 1 month with your sugar log. Check sugars at least 4x a day.

## 2014-02-20 LAB — T4, FREE: Free T4: 0.84 ng/dL (ref 0.80–1.80)

## 2014-02-20 LAB — T3, FREE: T3 FREE: 2.5 pg/mL (ref 2.3–4.2)

## 2014-02-20 LAB — TSH: TSH: 5.039 u[IU]/mL — ABNORMAL HIGH (ref 0.350–4.500)

## 2014-03-03 ENCOUNTER — Inpatient Hospital Stay (HOSPITAL_COMMUNITY): Payer: Medicaid Other

## 2014-03-03 ENCOUNTER — Inpatient Hospital Stay (HOSPITAL_COMMUNITY)
Admission: EM | Admit: 2014-03-03 | Discharge: 2014-03-06 | DRG: 871 | Disposition: A | Discharge status: Home / Self Care | Payer: Medicaid Other | Attending: Internal Medicine | Admitting: Internal Medicine

## 2014-03-03 DIAGNOSIS — E1043 Type 1 diabetes mellitus with diabetic autonomic (poly)neuropathy: Secondary | ICD-10-CM | POA: Diagnosis present

## 2014-03-03 DIAGNOSIS — Z91013 Allergy to seafood: Secondary | ICD-10-CM

## 2014-03-03 DIAGNOSIS — N189 Chronic kidney disease, unspecified: Secondary | ICD-10-CM | POA: Diagnosis present

## 2014-03-03 DIAGNOSIS — Z91040 Latex allergy status: Secondary | ICD-10-CM | POA: Diagnosis not present

## 2014-03-03 DIAGNOSIS — A419 Sepsis, unspecified organism: Secondary | ICD-10-CM | POA: Diagnosis present

## 2014-03-03 DIAGNOSIS — Z886 Allergy status to analgesic agent status: Secondary | ICD-10-CM

## 2014-03-03 DIAGNOSIS — R1084 Generalized abdominal pain: Secondary | ICD-10-CM

## 2014-03-03 DIAGNOSIS — E876 Hypokalemia: Secondary | ICD-10-CM | POA: Diagnosis not present

## 2014-03-03 DIAGNOSIS — IMO0002 Reserved for concepts with insufficient information to code with codable children: Secondary | ICD-10-CM

## 2014-03-03 DIAGNOSIS — E1010 Type 1 diabetes mellitus with ketoacidosis without coma: Secondary | ICD-10-CM

## 2014-03-03 DIAGNOSIS — Z794 Long term (current) use of insulin: Secondary | ICD-10-CM

## 2014-03-03 DIAGNOSIS — B955 Unspecified streptococcus as the cause of diseases classified elsewhere: Secondary | ICD-10-CM | POA: Diagnosis present

## 2014-03-03 DIAGNOSIS — Z87891 Personal history of nicotine dependence: Secondary | ICD-10-CM | POA: Diagnosis not present

## 2014-03-03 DIAGNOSIS — N390 Urinary tract infection, site not specified: Secondary | ICD-10-CM | POA: Diagnosis present

## 2014-03-03 DIAGNOSIS — K3184 Gastroparesis: Secondary | ICD-10-CM | POA: Diagnosis present

## 2014-03-03 DIAGNOSIS — E039 Hypothyroidism, unspecified: Secondary | ICD-10-CM | POA: Diagnosis present

## 2014-03-03 DIAGNOSIS — K219 Gastro-esophageal reflux disease without esophagitis: Secondary | ICD-10-CM | POA: Diagnosis present

## 2014-03-03 DIAGNOSIS — E872 Acidosis, unspecified: Secondary | ICD-10-CM

## 2014-03-03 DIAGNOSIS — F410 Panic disorder [episodic paroxysmal anxiety] without agoraphobia: Secondary | ICD-10-CM | POA: Diagnosis present

## 2014-03-03 DIAGNOSIS — R112 Nausea with vomiting, unspecified: Secondary | ICD-10-CM

## 2014-03-03 DIAGNOSIS — E0810 Diabetes mellitus due to underlying condition with ketoacidosis without coma: Secondary | ICD-10-CM

## 2014-03-03 DIAGNOSIS — E1065 Type 1 diabetes mellitus with hyperglycemia: Secondary | ICD-10-CM | POA: Diagnosis present

## 2014-03-03 LAB — URINALYSIS, ROUTINE W REFLEX MICROSCOPIC
Bilirubin Urine: NEGATIVE
Glucose, UA: 500 mg/dL — AB
HGB URINE DIPSTICK: NEGATIVE
NITRITE: NEGATIVE
PH: 5 (ref 5.0–8.0)
Protein, ur: NEGATIVE mg/dL
Specific Gravity, Urine: 1.018 (ref 1.005–1.030)
Urobilinogen, UA: 0.2 mg/dL (ref 0.0–1.0)

## 2014-03-03 LAB — COMPREHENSIVE METABOLIC PANEL
ALBUMIN: 4.9 g/dL (ref 3.5–5.2)
ALT: 30 U/L (ref 0–35)
AST: 72 U/L — ABNORMAL HIGH (ref 0–37)
Alkaline Phosphatase: 78 U/L (ref 39–117)
Anion gap: 31 — ABNORMAL HIGH (ref 5–15)
BILIRUBIN TOTAL: 1.1 mg/dL (ref 0.3–1.2)
BUN: 16 mg/dL (ref 6–23)
CO2: 13 mmol/L — ABNORMAL LOW (ref 19–32)
CREATININE: 0.77 mg/dL (ref 0.50–1.10)
Calcium: 9.6 mg/dL (ref 8.4–10.5)
Chloride: 92 mEq/L — ABNORMAL LOW (ref 96–112)
GFR calc Af Amer: >90 mL/min (ref >90)
GFR calc non Af Amer: >90 mL/min (ref >90)
GLUCOSE: 295 mg/dL — AB (ref 70–99)
Potassium: 4.3 mmol/L (ref 3.5–5.1)
Sodium: 136 mmol/L (ref 135–145)
Total Protein: 8.1 g/dL (ref 6.0–8.3)

## 2014-03-03 LAB — CBC WITH DIFFERENTIAL/PLATELET
BASOS ABS: 0 10*3/uL (ref 0.0–0.1)
BASOS PCT: 0 % (ref 0–1)
EOS PCT: 0 % (ref 0–5)
Eosinophils Absolute: 0 10*3/uL (ref 0.0–0.7)
HEMATOCRIT: 42.1 % (ref 36.0–46.0)
HEMOGLOBIN: 14.5 g/dL (ref 12.0–15.0)
LYMPHS PCT: 25 % (ref 12–46)
Lymphs Abs: 1.7 10*3/uL (ref 0.7–4.0)
MCH: 31.7 pg (ref 26.0–34.0)
MCHC: 34.4 g/dL (ref 30.0–36.0)
MCV: 92.1 fL (ref 78.0–100.0)
MONO ABS: 0.2 10*3/uL (ref 0.1–1.0)
MONOS PCT: 3 % (ref 3–12)
NEUTROS PCT: 72 % (ref 43–77)
Neutro Abs: 4.9 10*3/uL (ref 1.7–7.7)
Platelets: 326 10*3/uL (ref 150–400)
RBC: 4.57 MIL/uL (ref 3.87–5.11)
RDW: 13.1 % (ref 11.5–15.5)
WBC: 6.8 10*3/uL (ref 4.0–10.5)

## 2014-03-03 LAB — BLOOD GAS, VENOUS
ACID-BASE DEFICIT: 11.1 mmol/L — AB (ref 0.0–2.0)
BICARBONATE: 13.9 meq/L — AB (ref 20.0–24.0)
FIO2: 0.21 %
O2 SAT: 59.5 %
PH VEN: 7.293 (ref 7.250–7.300)
Patient temperature: 98.6
TCO2: 12.7 mmol/L (ref 0–100)
pCO2, Ven: 29.6 mmHg — ABNORMAL LOW (ref 45.0–50.0)

## 2014-03-03 LAB — LACTIC ACID, PLASMA: Lactic Acid, Venous: 1.7 mmol/L (ref 0.5–2.2)

## 2014-03-03 LAB — URINE MICROSCOPIC-ADD ON

## 2014-03-03 LAB — LIPASE, BLOOD: Lipase: 14 U/L (ref 11–59)

## 2014-03-03 LAB — MAGNESIUM: MAGNESIUM: 1.3 mg/dL — AB (ref 1.5–2.5)

## 2014-03-03 LAB — POC URINE PREG, ED: Preg Test, Ur: NEGATIVE

## 2014-03-03 LAB — CBG MONITORING, ED
GLUCOSE-CAPILLARY: 303 mg/dL — AB (ref 70–99)
Glucose-Capillary: 286 mg/dL — ABNORMAL HIGH (ref 70–99)
Glucose-Capillary: 349 mg/dL — ABNORMAL HIGH (ref 70–99)

## 2014-03-03 LAB — GLUCOSE, CAPILLARY
Glucose-Capillary: 357 mg/dL — ABNORMAL HIGH (ref 70–99)
Glucose-Capillary: 312 mg/dL — ABNORMAL HIGH (ref 70–99)

## 2014-03-03 LAB — I-STAT CG4 LACTIC ACID, ED: Lactic Acid, Venous: 5.45 mmol/L — ABNORMAL HIGH (ref 0.5–2.2)

## 2014-03-03 MED ORDER — METOCLOPRAMIDE HCL 5 MG/ML IJ SOLN
10.0000 mg | Freq: Once | INTRAMUSCULAR | Status: AC
  Administered 2014-03-03: 10 mg via INTRAVENOUS
  Filled 2014-03-03: qty 2

## 2014-03-03 MED ORDER — SODIUM CHLORIDE 0.9 % IV SOLN
INTRAVENOUS | Status: DC
  Administered 2014-03-03: 5.9 [IU]/h via INTRAVENOUS
  Administered 2014-03-05: 09:00:00 via INTRAVENOUS
  Filled 2014-03-03: qty 2.5

## 2014-03-03 MED ORDER — POTASSIUM CHLORIDE 10 MEQ/100ML IV SOLN
10.0000 meq | INTRAVENOUS | Status: AC
  Administered 2014-03-03 (×2): 10 meq via INTRAVENOUS
  Filled 2014-03-03: qty 100

## 2014-03-03 MED ORDER — SODIUM CHLORIDE 0.9 % IV BOLUS (SEPSIS)
2000.0000 mL | Freq: Once | INTRAVENOUS | Status: AC
  Administered 2014-03-03: 2000 mL via INTRAVENOUS

## 2014-03-03 MED ORDER — HYDROCODONE-ACETAMINOPHEN 5-325 MG PO TABS
1.0000 | ORAL_TABLET | Freq: Four times a day (QID) | ORAL | Status: DC | PRN
  Administered 2014-03-03 – 2014-03-06 (×7): 1 via ORAL
  Filled 2014-03-03 (×8): qty 1

## 2014-03-03 MED ORDER — SODIUM CHLORIDE 0.9 % IV SOLN
INTRAVENOUS | Status: DC
  Administered 2014-03-03: 2.9 [IU]/h via INTRAVENOUS
  Filled 2014-03-03: qty 2.5

## 2014-03-03 MED ORDER — FENTANYL CITRATE 0.05 MG/ML IJ SOLN
100.0000 ug | Freq: Once | INTRAMUSCULAR | Status: AC
  Administered 2014-03-03: 100 ug via INTRAVENOUS
  Filled 2014-03-03: qty 2

## 2014-03-03 MED ORDER — HYDROMORPHONE HCL 2 MG/ML IJ SOLN: 2.0000 mg | Freq: Once | INTRAMUSCULAR | Status: DC

## 2014-03-03 MED ORDER — GABAPENTIN 100 MG PO CAPS
200.0000 mg | ORAL_CAPSULE | Freq: Every day | ORAL | Status: DC
  Administered 2014-03-03 – 2014-03-05 (×3): 200 mg via ORAL
  Filled 2014-03-03 (×4): qty 2

## 2014-03-03 MED ORDER — METOCLOPRAMIDE HCL 5 MG/ML IJ SOLN
10.0000 mg | Freq: Three times a day (TID) | INTRAMUSCULAR | Status: DC
  Administered 2014-03-03 – 2014-03-06 (×8): 10 mg via INTRAVENOUS
  Filled 2014-03-03 (×9): qty 2

## 2014-03-03 MED ORDER — SODIUM CHLORIDE 0.9 % IV SOLN
INTRAVENOUS | Status: DC
  Administered 2014-03-03 – 2014-03-06 (×2): via INTRAVENOUS

## 2014-03-03 MED ORDER — AMITRIPTYLINE HCL 50 MG PO TABS
50.0000 mg | ORAL_TABLET | Freq: Every day | ORAL | Status: DC
Start: 2014-03-03 — End: 2014-03-06
  Administered 2014-03-03 – 2014-03-05 (×3): 50 mg via ORAL
  Filled 2014-03-03: qty 2
  Filled 2014-03-03 (×2): qty 1
  Filled 2014-03-03: qty 2

## 2014-03-03 MED ORDER — DEXTROSE-NACL 5-0.45 % IV SOLN
INTRAVENOUS | Status: DC
  Administered 2014-03-03 – 2014-03-05 (×3): via INTRAVENOUS

## 2014-03-03 MED ORDER — HYDROMORPHONE HCL 1 MG/ML IJ SOLN: 1.0000 mg | Freq: Once | INTRAMUSCULAR | Status: DC

## 2014-03-03 MED ORDER — ENOXAPARIN SODIUM 40 MG/0.4ML ~~LOC~~ SOLN
40.0000 mg | SUBCUTANEOUS | Status: DC
  Administered 2014-03-03 – 2014-03-05 (×3): 40 mg via SUBCUTANEOUS
  Filled 2014-03-03 (×4): qty 0.4

## 2014-03-03 MED ORDER — PANTOPRAZOLE SODIUM 40 MG PO TBEC
40.0000 mg | DELAYED_RELEASE_TABLET | Freq: Every day | ORAL | Status: DC
  Filled 2014-03-03: qty 1

## 2014-03-03 MED ORDER — SODIUM CHLORIDE 0.9 % IV BOLUS (SEPSIS)
1000.0000 mL | Freq: Once | INTRAVENOUS | Status: AC
  Administered 2014-03-03: 1000 mL via INTRAVENOUS

## 2014-03-03 MED ORDER — DEXTROSE-NACL 5-0.45 % IV SOLN
INTRAVENOUS | Status: DC
  Administered 2014-03-03: 18:00:00 via INTRAVENOUS

## 2014-03-03 MED ORDER — ONDANSETRON HCL 4 MG/2ML IJ SOLN
4.0000 mg | Freq: Once | INTRAMUSCULAR | Status: AC
Start: 2014-03-03 — End: 2014-03-03
  Administered 2014-03-03: 4 mg via INTRAVENOUS
  Filled 2014-03-03: qty 2

## 2014-03-03 MED ORDER — ONDANSETRON HCL 4 MG/2ML IJ SOLN
4.0000 mg | Freq: Four times a day (QID) | INTRAMUSCULAR | Status: DC | PRN
  Administered 2014-03-03 – 2014-03-05 (×5): 4 mg via INTRAVENOUS
  Filled 2014-03-03 (×5): qty 2

## 2014-03-03 MED ORDER — HYDROMORPHONE HCL 1 MG/ML IJ SOLN
1.0000 mg | Freq: Once | INTRAMUSCULAR | Status: AC
Start: 2014-03-03 — End: 2014-03-03
  Administered 2014-03-03: 1 mg via INTRAVENOUS
  Filled 2014-03-03: qty 1

## 2014-03-03 MED ORDER — ALUM & MAG HYDROXIDE-SIMETH 200-200-20 MG/5ML PO SUSP
30.0000 mL | Freq: Four times a day (QID) | ORAL | Status: DC | PRN
  Administered 2014-03-03 – 2014-03-04 (×2): 30 mL via ORAL
  Filled 2014-03-03 (×2): qty 30

## 2014-03-03 MED ORDER — HYDROMORPHONE HCL 1 MG/ML IJ SOLN
1.0000 mg | INTRAMUSCULAR | Status: DC | PRN
Start: 2014-03-03 — End: 2014-03-06
  Administered 2014-03-04 – 2014-03-06 (×14): 1 mg via INTRAVENOUS
  Filled 2014-03-03 (×14): qty 1

## 2014-03-03 NOTE — ED Provider Notes (Signed)
CSN: [id number]     Arrival date & time 03/03/14  1603 History   First MD Initiated Contact with Patient 03/03/14 1607     Chief Complaint  Patient presents with  . Abdominal Pain  . Emesis     (Consider location/radiation/quality/duration/timing/severity/associated sxs/prior Treatment) HPI  34 year old female presents with recurrent abdominal pain, nausea, and vomiting. She states it started this morning, EMS notes state it started a couple days ago. She took a hydrocodone last night with no relief of the pain. Patient states she gets this pain every time she is in DKA and feels like this is a recurrence. The abdominal pain is generalized. There is no dysuria. No diarrhea. No fevers or cough. Patient also feels a "fluttering in her chest" as well as that she is breathing harder like she does when she is in DKA. EMS reports that her initial blood pressure was 70/30, she was given 100 mL of IV fluids out her blood pressure is 90/50. Patient rates the pain as a 10/10. EMS's initial glucose was 303. Any type of movement worsens her pain.  Past Medical History  Diagnosis Date  . Diabetes mellitus   . Thyroid disease     hypothyroidism associated with pregnancy  . Back pain   . Panic attack   . PONV (postoperative nausea and vomiting)   . Anxiety   . GERD (gastroesophageal reflux disease)   . DM gastroparesis   . Chronic kidney disease   . Anemia    Past Surgical History  Procedure Laterality Date  . Laparoscopy      age 13  . Back surgery      age 35  . Tooth extraction    . Root canal  10/10/12   Family History  Problem Relation Age of Onset  . Diabetes Maternal Grandmother   . Cancer Maternal Grandfather     Small cell lung cancer  . Cancer Maternal Grandmother     Leukemia   History  Substance Use Topics  . Smoking status: Former Smoker    Quit date: 04/19/2000  . Smokeless tobacco: Former Systems developer  . Alcohol Use: Yes     Comment: RARE SIPS OF WINE   OB History    No  data available     Review of Systems  Constitutional: Negative for fever.  Respiratory: Negative for cough and shortness of breath.   Cardiovascular: Positive for palpitations. Negative for chest pain.  Gastrointestinal: Positive for nausea, vomiting and abdominal pain. Negative for diarrhea.  Genitourinary: Negative for dysuria.  Musculoskeletal: Negative for back pain.      Allergies  Scallops; Morphine and related; and Latex  Home Medications   Prior to Admission medications   Medication Sig Start Date End Date Taking? Authorizing Provider  amitriptyline (ELAVIL) 50 MG tablet Take 50 mg by mouth at bedtime.   Yes Historical Provider, MD  cloNIDine (CATAPRES) 0.1 MG tablet Take 0.1 mg by mouth daily.    Yes Historical Provider, MD  famotidine (PEPCID) 20 MG tablet Take 1 tablet (20 mg total) by mouth 2 (two) times daily. 11/05/13  Yes Nishant Dhungel, MD  gabapentin (NEURONTIN) 100 MG capsule Take 200 mg by mouth at bedtime.   Yes Historical Provider, MD  HYDROcodone-acetaminophen (NORCO/VICODIN) 5-325 MG per tablet Take 1 tablet by mouth every 6 (six) hours as needed for moderate pain. 02/04/14  Yes Reyne Dumas, MD  insulin aspart (NOVOLOG) 100 UNIT/ML injection Inject 1-3 Units into the skin 3 (three) times daily with  meals. CBG < 70: implement hypoglycemia protocol CBG > 400: call MD   CBG 70 - 120: 0 units CBG 121 - 150: 3 units CBG 151 - 200: 4 units CBG 201 - 250: 7 units CBG 251 - 300: 11 units CBG 301 - 350: 15 units CBG 351 - 400: 20 units CBG > 400: call MD 02/01/13  Yes Donita Brooks, NP  insulin glargine (LANTUS) 100 UNIT/ML injection Inject 0.15 mLs (15 Units total) into the skin every morning. 02/19/14  Yes Philemon Kingdom, MD  lisinopril (PRINIVIL,ZESTRIL) 2.5 MG tablet Take 2.5 mg by mouth daily.   Yes Historical Provider, MD  metoCLOPramide (REGLAN) 10 MG tablet Take 1 tablet (10 mg total) by mouth 4 (four) times daily -  before meals and at bedtime.  02/04/14  Yes Reyne Dumas, MD  Multiple Vitamin (MULTIVITAMIN WITH MINERALS) TABS Take 1 tablet by mouth every morning. Diabetic support vitamin   Yes Historical Provider, MD  nystatin-triamcinolone (MYCOLOG II) cream Apply 1 application topically 2 (two) times daily.   Yes Historical Provider, MD  omeprazole (PRILOSEC) 40 MG capsule Take 40 mg by mouth daily.   Yes Historical Provider, MD  ondansetron (ZOFRAN-ODT) 8 MG disintegrating tablet Take 8 mg by mouth every 8 (eight) hours as needed for nausea or vomiting (nausea & vomiting).    Yes Historical Provider, MD  pantoprazole (PROTONIX) 40 MG tablet Take 1 tablet (40 mg total) by mouth daily. 02/04/14  Yes Reyne Dumas, MD  alum & mag hydroxide-simeth (MAALOX/MYLANTA) 200-200-20 MG/5ML suspension Take 30 mLs by mouth every 6 (six) hours as needed for indigestion or heartburn. 11/05/13   Nishant Dhungel, MD  glucagon (GLUCAGON EMERGENCY) 1 MG injection Inject 1 mg into the muscle once as needed (severe hypoglycemia). 02/19/14   Philemon Kingdom, MD   BP 95/54 mmHg  Pulse 108  Temp(Src) 98.3 F (36.8 C) (Oral)  Resp 20  SpO2 97%  LMP 12/30/2013 Physical Exam  Constitutional: She is oriented to person, place, and time. She appears well-developed and well-nourished.  HENT:  Head: Normocephalic and atraumatic.  Right Ear: External ear normal.  Left Ear: External ear normal.  Nose: Nose normal.  Eyes: Right eye exhibits no discharge. Left eye exhibits no discharge.  Cardiovascular: Regular rhythm and normal heart sounds.  Tachycardia present.   Pulmonary/Chest: Effort normal and breath sounds normal.  Abdominal: Soft. There is tenderness.  Diffuse abdominal tenderness, mostly epigastric. Soft, nondistended abdomen.  Neurological: She is alert and oriented to person, place, and time.  Skin: Skin is warm and dry.  Nursing note and vitals reviewed.   ED Course  Procedures (including critical care time) Labs Review Labs Reviewed   COMPREHENSIVE METABOLIC PANEL - Abnormal; Notable for the following:    Chloride 92 (*)    CO2 13 (*)    Glucose, Bld 295 (*)    AST 72 (*)    Anion gap 31 (*)    All other components within normal limits  URINALYSIS, ROUTINE W REFLEX MICROSCOPIC - Abnormal; Notable for the following:    Glucose, UA 500 (*)    Ketones, ur >80 (*)    Leukocytes, UA SMALL (*)    All other components within normal limits  BLOOD GAS, VENOUS - Abnormal; Notable for the following:    pCO2, Ven 29.6 (*)    Bicarbonate 13.9 (*)    Acid-base deficit 11.1 (*)    All other components within normal limits  CBG MONITORING, ED - Abnormal; Notable for  the following:    Glucose-Capillary 286 (*)    All other components within normal limits  CBG MONITORING, ED - Abnormal; Notable for the following:    Glucose-Capillary 303 (*)    All other components within normal limits  I-STAT CG4 LACTIC ACID, ED - Abnormal; Notable for the following:    Lactic Acid, Venous 5.45 (*)    All other components within normal limits  LIPASE, BLOOD  CBC WITH DIFFERENTIAL  URINE MICROSCOPIC-ADD ON  POC URINE PREG, ED    Imaging Review No results found.   EKG Interpretation   Date/Time:  Sunday March 03 2014 16:11:10 EST Ventricular Rate:  107 PR Interval:  112 QRS Duration: 89 QT Interval:  390 QTC Calculation: 520 R Axis:   62 Text Interpretation:  Age not entered, assumed to be  34 years old for  purpose of ECG interpretation Sinus tachycardia Low voltage, precordial  leads Probable anteroseptal infarct, old Prolonged QT interval No  significant change since last tracing Confirmed by Vi Whitesel  MD, Lyndzie Zentz  ([id number]) on 03/03/2014 4:48:08 PM      MDM   Final diagnoses:  Diabetic ketoacidosis without coma associated with type 1 diabetes mellitus  Lactic acidosis    Patient with recurrent DKA as well as lactic acidosis. Her abdominal exam is benign, and given she always gets abd pain with DKA I do not feel she needs  further workup. Given fluid resuscitation and started on insulin.    Ephraim Hamburger, MD 03/04/14 [phone]

## 2014-03-03 NOTE — ED Notes (Signed)
Patient reports that she has previously required PICC lines and CVL sites due to poor venous access Dr. Regenia Skeeter at bedside with [place] to attempt placement while awaiting IV RN Per MD, Insulin to be held until 2nd IV site obtained

## 2014-03-03 NOTE — ED Notes (Signed)
RN and MD made aware of pts elevated lactic acid of 5.45.

## 2014-03-03 NOTE — ED Notes (Signed)
Per Pharmacy staff, D5 1/2 NS can not be run concurrently with Insulin infusion As documented, patient with only one PIV site--Consult to IV team placed D5 1/2 NS stopped until 2nd IV site can be established  3rd NS bolus infusing, per MD order

## 2014-03-03 NOTE — ED Notes (Signed)
Friday nausea/vomiting, today diffuse abdominal pain. Has not eaten since Friday d/t vomiting. EMS reports pt took one hydrocodone last night, today initial BP 70/30, they state has risen to 90/50. EMS reports pt pain score a 10/10

## 2014-03-03 NOTE — H&P (Signed)
PCP:   [name], MD   Chief Complaint:  N/v abd pain  HPI: 34 yo female h/o dm, gastroparesis comes in with one day of generalized abd pain and n/v multiple episodes that is usually typical of her gastroparesis (looks like recent dx).  No fevers.  No blood in vomit.  No diarrhea.  She is on reglan at home and it doesn't always help.  She was unable to hold her meds down, and her sugar was rising and she knew she would go into DKA so she presented to the ED.  She has been given reglan, and fentanyl and still unable to hold anything down.  Found to be in DKA.  Review of Systems:  Positive and negative as per HPI otherwise all other systems are negative  Past Medical History: Past Medical History  Diagnosis Date  . Diabetes mellitus   . Thyroid disease     hypothyroidism associated with pregnancy  . Back pain   . Panic attack   . PONV (postoperative nausea and vomiting)   . Anxiety   . GERD (gastroesophageal reflux disease)   . DM gastroparesis   . Chronic kidney disease   . Anemia    Past Surgical History  Procedure Laterality Date  . Laparoscopy      age 70  . Back surgery      age 89  . Tooth extraction    . Root canal  10/10/12    Medications: Prior to Admission medications   Medication Sig Start Date End Date Taking? Authorizing Provider  amitriptyline (ELAVIL) 50 MG tablet Take 50 mg by mouth at bedtime.   Yes Historical Provider, MD  cloNIDine (CATAPRES) 0.1 MG tablet Take 0.1 mg by mouth daily.    Yes Historical Provider, MD  famotidine (PEPCID) 20 MG tablet Take 1 tablet (20 mg total) by mouth 2 (two) times daily. 11/05/13  Yes Nishant Dhungel, MD  gabapentin (NEURONTIN) 100 MG capsule Take 200 mg by mouth at bedtime.   Yes Historical Provider, MD  HYDROcodone-acetaminophen (NORCO/VICODIN) 5-325 MG per tablet Take 1 tablet by mouth every 6 (six) hours as needed for moderate pain. 02/04/14  Yes Reyne Dumas, MD  insulin aspart (NOVOLOG) 100 UNIT/ML injection  Inject 1-3 Units into the skin 3 (three) times daily with meals. CBG < 70: implement hypoglycemia protocol CBG > 400: call MD   CBG 70 - 120: 0 units CBG 121 - 150: 3 units CBG 151 - 200: 4 units CBG 201 - 250: 7 units CBG 251 - 300: 11 units CBG 301 - 350: 15 units CBG 351 - 400: 20 units CBG > 400: call MD 02/01/13  Yes Donita Brooks, NP  insulin glargine (LANTUS) 100 UNIT/ML injection Inject 0.15 mLs (15 Units total) into the skin every morning. 02/19/14  Yes Philemon Kingdom, MD  lisinopril (PRINIVIL,ZESTRIL) 2.5 MG tablet Take 2.5 mg by mouth daily.   Yes Historical Provider, MD  metoCLOPramide (REGLAN) 10 MG tablet Take 1 tablet (10 mg total) by mouth 4 (four) times daily -  before meals and at bedtime. 02/04/14  Yes Reyne Dumas, MD  Multiple Vitamin (MULTIVITAMIN WITH MINERALS) TABS Take 1 tablet by mouth every morning. Diabetic support vitamin   Yes Historical Provider, MD  nystatin-triamcinolone (MYCOLOG II) cream Apply 1 application topically 2 (two) times daily.   Yes Historical Provider, MD  omeprazole (PRILOSEC) 40 MG capsule Take 40 mg by mouth daily.   Yes Historical Provider, MD  ondansetron (ZOFRAN-ODT) 8 MG disintegrating  tablet Take 8 mg by mouth every 8 (eight) hours as needed for nausea or vomiting (nausea & vomiting).    Yes Historical Provider, MD  pantoprazole (PROTONIX) 40 MG tablet Take 1 tablet (40 mg total) by mouth daily. 02/04/14  Yes Reyne Dumas, MD  alum & mag hydroxide-simeth (MAALOX/MYLANTA) 200-200-20 MG/5ML suspension Take 30 mLs by mouth every 6 (six) hours as needed for indigestion or heartburn. 11/05/13   Nishant Dhungel, MD  glucagon (GLUCAGON EMERGENCY) 1 MG injection Inject 1 mg into the muscle once as needed (severe hypoglycemia). 02/19/14   Philemon Kingdom, MD    Allergies:   Allergies  Allergen Reactions  . Scallops [Shellfish Allergy] Anaphylaxis  . Morphine And Related Itching  . Latex Rash    Social History:  reports that she quit  smoking about 13 years ago. She has quit using smokeless tobacco. She reports that she drinks alcohol. She reports that she does not use illicit drugs.  Family History: Family History  Problem Relation Age of Onset  . Diabetes Maternal Grandmother   . Cancer Maternal Grandfather     Small cell lung cancer  . Cancer Maternal Grandmother     Leukemia    Physical Exam: Filed Vitals:   03/03/14 1611  BP: 95/54  Pulse: 108  Temp: 98.3 F (36.8 C)  TempSrc: Oral  Resp: 20  SpO2: 97%   General appearance: alert, cooperative and mild distress Head: Normocephalic, without obvious abnormality, atraumatic Eyes: negative Nose: Nares normal. Septum midline. Mucosa normal. No drainage or sinus tenderness. Neck: no JVD and supple, symmetrical, trachea midline Lungs: clear to auscultation bilaterally Heart: regular rate and rhythm, S1, S2 normal, no murmur, click, rub or gallop Abdomen: soft, generalized ttp; bowel sounds normal; no masses,  no organomegaly Extremities: extremities normal, atraumatic, no cyanosis or edema Pulses: 2+ and symmetric Skin: Skin color, texture, turgor normal. No rashes or lesions Neurologic: Grossly normal    Labs on Admission:   Recent Labs  03/03/14 1631  NA 136  K 4.3  CL 92*  CO2 13*  GLUCOSE 295*  BUN 16  CREATININE 0.77  CALCIUM 9.6    Recent Labs  03/03/14 1631  AST 72*  ALT 30  ALKPHOS 78  BILITOT 1.1  PROT 8.1  ALBUMIN 4.9    Recent Labs  03/03/14 1631  LIPASE 14    Recent Labs  03/03/14 1631  WBC 6.8  NEUTROABS 4.9  HGB 14.5  HCT 42.1  MCV 92.1  PLT 326   Radiological Exams on Admission:  none  Assessment/Plan 34 yo female with suspect gastroparesis flare with DKA  Principal Problem:   DKA (diabetic ketoacidoses)-  Insulin gtt.  dka pathway.    Active Problems:   Diabetic gastroparesis associated with type 1 diabetes mellitus-  Scheduled iv reglan.  Needs gi follow up.   Nausea and vomiting-  zofran  prn   DM (diabetes mellitus), type 1, uncontrolled   Abdominal pain, generalized-  Iv dilaudid prn, has received in past with no difficulty despite her listed morphine allergy.  lfts and lipase nml.  abd exam benign, nonacute at this time.  Admit to stepdown.  Full code.  DAVID,RACHAL A 03/03/2014, 6:40 PM

## 2014-03-03 NOTE — ED Notes (Signed)
2nd IV site attempted by multiple staff  Consult for IV team placed by ED Charge Nurse Patient in NAD

## 2014-03-04 DIAGNOSIS — K3184 Gastroparesis: Secondary | ICD-10-CM

## 2014-03-04 DIAGNOSIS — E1043 Type 1 diabetes mellitus with diabetic autonomic (poly)neuropathy: Secondary | ICD-10-CM

## 2014-03-04 LAB — GLUCOSE, CAPILLARY
GLUCOSE-CAPILLARY: 109 mg/dL — AB (ref 70–99)
GLUCOSE-CAPILLARY: 122 mg/dL — AB (ref 70–99)
GLUCOSE-CAPILLARY: 144 mg/dL — AB (ref 70–99)
GLUCOSE-CAPILLARY: 148 mg/dL — AB (ref 70–99)
GLUCOSE-CAPILLARY: 151 mg/dL — AB (ref 70–99)
GLUCOSE-CAPILLARY: 151 mg/dL — AB (ref 70–99)
GLUCOSE-CAPILLARY: 182 mg/dL — AB (ref 70–99)
GLUCOSE-CAPILLARY: 189 mg/dL — AB (ref 70–99)
GLUCOSE-CAPILLARY: 216 mg/dL — AB (ref 70–99)
Glucose-Capillary: 138 mg/dL — ABNORMAL HIGH (ref 70–99)
Glucose-Capillary: 220 mg/dL — ABNORMAL HIGH (ref 70–99)
Glucose-Capillary: 95 mg/dL (ref 70–99)
Glucose-Capillary: 91 mg/dL (ref 70–99)
Glucose-Capillary: 139 mg/dL — ABNORMAL HIGH (ref 70–99)
Glucose-Capillary: 218 mg/dL — ABNORMAL HIGH (ref 70–99)
Glucose-Capillary: 136 mg/dL — ABNORMAL HIGH (ref 70–99)
Glucose-Capillary: 169 mg/dL — ABNORMAL HIGH (ref 70–99)
Glucose-Capillary: 242 mg/dL — ABNORMAL HIGH (ref 70–99)
Glucose-Capillary: 130 mg/dL — ABNORMAL HIGH (ref 70–99)
Glucose-Capillary: 149 mg/dL — ABNORMAL HIGH (ref 70–99)
Glucose-Capillary: 207 mg/dL — ABNORMAL HIGH (ref 70–99)

## 2014-03-04 LAB — BASIC METABOLIC PANEL
ANION GAP: 9 (ref 5–15)
Anion gap: 13 (ref 5–15)
Anion gap: 15 (ref 5–15)
Anion gap: 5 (ref 5–15)
Anion gap: 2 — ABNORMAL LOW (ref 5–15)
Anion gap: 3 — ABNORMAL LOW (ref 5–15)
BUN: 13 mg/dL (ref 6–23)
BUN: 11 mg/dL (ref 6–23)
BUN: 11 mg/dL (ref 6–23)
BUN: 10 mg/dL (ref 6–23)
BUN: 8 mg/dL (ref 6–23)
BUN: 7 mg/dL (ref 6–23)
CALCIUM: 7.9 mg/dL — AB (ref 8.4–10.5)
CALCIUM: 8.1 mg/dL — AB (ref 8.4–10.5)
CALCIUM: 8 mg/dL — AB (ref 8.4–10.5)
CALCIUM: 8 mg/dL — AB (ref 8.4–10.5)
CALCIUM: 7.7 mg/dL — AB (ref 8.4–10.5)
CALCIUM: 7.7 mg/dL — AB (ref 8.4–10.5)
CHLORIDE: 110 meq/L (ref 96–112)
CHLORIDE: 110 meq/L (ref 96–112)
CHLORIDE: 110 meq/L (ref 96–112)
CHLORIDE: 113 meq/L — AB (ref 96–112)
CO2: 13 mmol/L — ABNORMAL LOW (ref 19–32)
CO2: 14 mmol/L — ABNORMAL LOW (ref 19–32)
CO2: 18 mmol/L — ABNORMAL LOW (ref 19–32)
CO2: 18 mmol/L — ABNORMAL LOW (ref 19–32)
CO2: 18 mmol/L — AB (ref 19–32)
CO2: 18 mmol/L — ABNORMAL LOW (ref 19–32)
CREATININE: 0.72 mg/dL (ref 0.50–1.10)
CREATININE: 0.72 mg/dL (ref 0.50–1.10)
Chloride: 106 mEq/L (ref 96–112)
Chloride: 109 mEq/L (ref 96–112)
Creatinine, Ser: 0.81 mg/dL (ref 0.50–1.10)
Creatinine, Ser: 0.61 mg/dL (ref 0.50–1.10)
Creatinine, Ser: 0.63 mg/dL (ref 0.50–1.10)
Creatinine, Ser: 0.55 mg/dL (ref 0.50–1.10)
GFR calc Af Amer: >90 mL/min (ref >90)
GFR calc Af Amer: >90 mL/min (ref >90)
GFR calc Af Amer: >90 mL/min (ref >90)
GFR calc non Af Amer: >90 mL/min (ref >90)
GFR calc non Af Amer: >90 mL/min (ref >90)
GFR calc non Af Amer: >90 mL/min (ref >90)
GFR calc non Af Amer: >90 mL/min (ref >90)
GLUCOSE: 148 mg/dL — AB (ref 70–99)
GLUCOSE: 183 mg/dL — AB (ref 70–99)
GLUCOSE: 160 mg/dL — AB (ref 70–99)
Glucose, Bld: 155 mg/dL — ABNORMAL HIGH (ref 70–99)
Glucose, Bld: 141 mg/dL — ABNORMAL HIGH (ref 70–99)
Glucose, Bld: 150 mg/dL — ABNORMAL HIGH (ref 70–99)
POTASSIUM: 3.4 mmol/L — AB (ref 3.5–5.1)
Potassium: 4.3 mmol/L (ref 3.5–5.1)
Potassium: 4.7 mmol/L (ref 3.5–5.1)
Potassium: 3.8 mmol/L (ref 3.5–5.1)
Potassium: 3.5 mmol/L (ref 3.5–5.1)
Potassium: 4.4 mmol/L (ref 3.5–5.1)
SODIUM: 136 mmol/L (ref 135–145)
SODIUM: 130 mmol/L — AB (ref 135–145)
Sodium: 135 mmol/L (ref 135–145)
Sodium: 137 mmol/L (ref 135–145)
Sodium: 132 mmol/L — ABNORMAL LOW (ref 135–145)
Sodium: 134 mmol/L — ABNORMAL LOW (ref 135–145)

## 2014-03-04 LAB — MRSA PCR SCREENING: MRSA by PCR: NEGATIVE

## 2014-03-04 MED ORDER — POTASSIUM CHLORIDE 10 MEQ/100ML IV SOLN
INTRAVENOUS | Status: AC
  Filled 2014-03-04: qty 100

## 2014-03-04 MED ORDER — SODIUM CHLORIDE 0.9 % IV BOLUS (SEPSIS)
500.0000 mL | Freq: Once | INTRAVENOUS | Status: AC
  Administered 2014-03-04: 500 mL via INTRAVENOUS

## 2014-03-04 MED ORDER — CIPROFLOXACIN IN D5W 400 MG/200ML IV SOLN: 400.0000 mg | INTRAVENOUS | Status: DC

## 2014-03-04 MED ORDER — PANTOPRAZOLE SODIUM 40 MG IV SOLR
40.0000 mg | INTRAVENOUS | Status: DC
  Administered 2014-03-04 – 2014-03-06 (×3): 40 mg via INTRAVENOUS
  Filled 2014-03-04 (×3): qty 40

## 2014-03-04 MED ORDER — CIPROFLOXACIN IN D5W 400 MG/200ML IV SOLN
400.0000 mg | Freq: Two times a day (BID) | INTRAVENOUS | Status: DC
  Administered 2014-03-04 – 2014-03-06 (×5): 400 mg via INTRAVENOUS
  Filled 2014-03-04 (×6): qty 200

## 2014-03-04 NOTE — Progress Notes (Addendum)
Inpatient Diabetes Program Recommendations  AACE/ADA: New Consensus Statement on Inpatient Glycemic Control (2013)  Target Ranges:  Prepandial:   less than 140 mg/dL      Peak postprandial:   less than 180 mg/dL (1-2 hours)      Critically ill patients:  140 - 180 mg/dL   Reason for Assessment:  Referral received.  Patient admitted with DKA.   Diabetes history: Type 1 diabetes-  See's Dr. Renne Crigler Outpatient Diabetes medications: Per Dr. Chrissie Noa notes, patient should be taking: Decrease Lantus to 15 units in a.m. Increase mealtime Novolog: 1 unit for 1 carb choice (15g of carbs) Change the sliding scale: - 200-300: + 1 unit - 301-400: + 2 units - >400: + 3 units Current orders for Inpatient glycemic control:  DKA order set, Phase 1  Will speak to patient.  Note recent diagnosis of gastroparesis. She currently continues on insulin drip due to DKA.  Will follow.  Adah Perl, RN, BC-ADM Inpatient Diabetes Coordinator Pager [phone]    12:30-  Spoke briefly with patient. She states that she thinks she may have picked up a virus from her daughter.  She states that once she starts vomiting, she usually goes into DKA.  She continues on insulin drip.  Once ready to transition, recommend restarting Home insulin regimen that was prescribed by Dr. Renne Crigler.

## 2014-03-04 NOTE — Progress Notes (Addendum)
Patient ID: [name], female   DOB: February 03, 1981, 34 y.o.   MRN: [id number] [facility] PROGRESS NOTE  [name] PVV:[id number] DOB: 1980-06-17 DOA: 03/03/2014 PCP: [name], MD  Brief narrative:    34 -year-old female with past medical history diabetes, related gastroparesis who presented to [facility] ED with complaints of worsening generalized abdominal pain with associated nausea and multiple episodes of nonbloody vomiting. Patient reported these are typical of gastroparesis flare. She did not complain of fevers or diarrhea. She reported she usually takes Reglan and at this time he did not provide symptomatic relief. On admission, blood pressure was 80/35 but has improved to 106/57 with IV fluids. Heart rate was 108 - 126, T max 99.3 F and oxygen saturation 97% on room air. Blood work was significant for CBG 286, blood glucose 295, anion gap 31. She had small leukocytes on urinalysis. Patient was started on insulin drip per DKA protocol and she was admitted to stepdown unit for further management.   Assessment/Plan:    Principal Problem:   DKA (diabetic ketoacidoses) / diabetes mellitus, uncontrolled with complications of gastroparesis / high anion gap metabolic acidosis  Patient with uncontrolled diabetes mellitus. A1c in December 2015 was 11 indicating poor glycemic control.  Patient was admitted with CBG of 286, blood glucose 295, anion gap of 31. She was started on insulin drip per DKA protocol.  Patient is still on insulin drip this morning.  CBGs in past 12 hours: 139, 182, 216  Appreciated diabetic coordinator consult and recommendations.  Active Problems:   Sepsis secondary to urinary tract infection  Sepsis criteria met on the admission with hypotension, tachycardia, low-grade fever. In addition patient had evidence of small leukocytes on urinalysis.  We will start ciprofloxacin 400 mg IV daily.  Obtain urine culture.      Nausea and vomiting / diabetic  gastroparesis  Likely triggered by uncontrolled diabetes  Continue supportive care with IV fluids, Reglan 10 mg IV every 8 hours scheduled, Protonix daily.       Diabetic neuropathy  Continue gabapentin 200 mg at bedtime    DVT Prophylaxis   Lovenox subcutaneous ordered  Code Status: Full.  Family Communication:  plan of care discussed with the patient Disposition Plan: Home when stable.   IV access:  Peripheral IV  Procedures and diagnostic studies:    Dg Abd Acute W/chest  03/03/2014  Paucity of intra-abdominal bowel gas. No dilated bowel loops to suggest obstruction.      Medical Consultants:  None  Other Consultants:  Diabetic coordinator  IAnti-Infectives:   None    Leisa Lenz, MD  [facility] Pager [phone]  If 7PM-7AM, please contact night-coverage [email or web address] Password [email or web address] 03/04/2014, 9:00 AM   LOS: 1 day    HPI/Subjective: No acute overnight events.  Objective: Filed Vitals:   03/04/14 0500 03/04/14 0600 03/04/14 0700 03/04/14 0800  BP: 93/49 81/41 84/44  84/42  Pulse: 120 109 105 102  Temp:    98.7 F (37.1 C)  TempSrc:    Oral  Resp: 18 17 13 14   Height:      Weight:      SpO2: 99% 98% 98% 98%    Intake/Output Summary (Last 24 hours) at 03/04/14 0900 Last data filed at 03/04/14 0820  Gross per 24 hour  Intake 1121.37 ml  Output   1675 ml  Net -553.63 ml    Exam:   General:  Pt is alert, follows commands appropriately, not in acute distress  Cardiovascular: Regular  rate and rhythm, S1/S2, no murmurs  Respiratory: Clear to auscultation bilaterally, no wheezing, no crackles, no rhonchi  Abdomen: Soft, non tender, non distended, bowel sounds present  Extremities: No edema, pulses DP and PT palpable bilaterally  Neuro: Grossly nonfocal  Data Reviewed: Basic Metabolic Panel:  Recent Labs Lab 03/03/14 1631 03/03/14 2310 03/04/14 0140 03/04/14 0540  NA 136  --  136 135  K 4.3  --  4.3 4.7  CL 92*  --  110  106  CO2 13*  --  13* 14*  GLUCOSE 295*  --  148* 183*  BUN 16  --  13 11  CREATININE 0.77  --  0.81 0.72  CALCIUM 9.6  --  7.9* 8.1*  MG  --  1.3*  --   --    Liver Function Tests:  Recent Labs Lab 03/03/14 1631  AST 72*  ALT 30  ALKPHOS 78  BILITOT 1.1  PROT 8.1  ALBUMIN 4.9    Recent Labs Lab 03/03/14 1631  LIPASE 14   No results for input(s): AMMONIA in the last 168 hours. CBC:  Recent Labs Lab 03/03/14 1631  WBC 6.8  NEUTROABS 4.9  HGB 14.5  HCT 42.1  MCV 92.1  PLT 326   Cardiac Enzymes: No results for input(s): CKTOTAL, CKMB, CKMBINDEX, TROPONINI in the last 168 hours. BNP: Invalid input(s): POCBNP CBG:  Recent Labs Lab 03/04/14 0254 03/04/14 0357 03/04/14 0500 03/04/14 0604 03/04/14 0714  GLUCAP 95 91 139* 182* 216*    Recent Results (from the past 240 hour(s))  MRSA PCR Screening     Status: None   Collection Time: 03/03/14 10:41 PM  Result Value Ref Range Status   MRSA by PCR NEGATIVE NEGATIVE Final     Scheduled Meds: . amitriptyline  50 mg Oral QHS  . enoxaparin (LOVENOX) injection  40 mg Subcutaneous Q24H  . gabapentin  200 mg Oral QHS  . metoCLOPramide (REGLAN) injection  10 mg Intravenous 3 times per day  . pantoprazole  40 mg Oral Daily   Continuous Infusions: . sodium chloride Stopped (03/04/14 0215)  . dextrose 5 % and 0.45% NaCl 100 mL/hr at 03/04/14 0300  . insulin (NOVOLIN-R) infusion 3.1 Units/hr (03/04/14 0715)

## 2014-03-05 ENCOUNTER — Encounter (HOSPITAL_COMMUNITY): Payer: Self-pay

## 2014-03-05 DIAGNOSIS — E1143 Type 2 diabetes mellitus with diabetic autonomic (poly)neuropathy: Secondary | ICD-10-CM

## 2014-03-05 DIAGNOSIS — E1010 Type 1 diabetes mellitus with ketoacidosis without coma: Secondary | ICD-10-CM

## 2014-03-05 LAB — GLUCOSE, CAPILLARY
GLUCOSE-CAPILLARY: 189 mg/dL — AB (ref 70–99)
GLUCOSE-CAPILLARY: 261 mg/dL — AB (ref 70–99)
Glucose-Capillary: 129 mg/dL — ABNORMAL HIGH (ref 70–99)
Glucose-Capillary: 129 mg/dL — ABNORMAL HIGH (ref 70–99)
Glucose-Capillary: 130 mg/dL — ABNORMAL HIGH (ref 70–99)
Glucose-Capillary: 145 mg/dL — ABNORMAL HIGH (ref 70–99)
Glucose-Capillary: 156 mg/dL — ABNORMAL HIGH (ref 70–99)
Glucose-Capillary: 172 mg/dL — ABNORMAL HIGH (ref 70–99)
Glucose-Capillary: 187 mg/dL — ABNORMAL HIGH (ref 70–99)
Glucose-Capillary: 223 mg/dL — ABNORMAL HIGH (ref 70–99)
Glucose-Capillary: 324 mg/dL — ABNORMAL HIGH (ref 70–99)
Glucose-Capillary: 253 mg/dL — ABNORMAL HIGH (ref 70–99)
Glucose-Capillary: 163 mg/dL — ABNORMAL HIGH (ref 70–99)

## 2014-03-05 LAB — BASIC METABOLIC PANEL
ANION GAP: 3 — AB (ref 5–15)
Anion gap: 6 (ref 5–15)
BUN: 6 mg/dL (ref 6–23)
BUN: 5 mg/dL — ABNORMAL LOW (ref 6–23)
CO2: 18 mmol/L — AB (ref 19–32)
CO2: 19 mmol/L (ref 19–32)
CREATININE: 0.61 mg/dL (ref 0.50–1.10)
Calcium: 7.8 mg/dL — ABNORMAL LOW (ref 8.4–10.5)
Calcium: 8.3 mg/dL — ABNORMAL LOW (ref 8.4–10.5)
Chloride: 116 mEq/L — ABNORMAL HIGH (ref 96–112)
Chloride: 112 mEq/L (ref 96–112)
Creatinine, Ser: 0.61 mg/dL (ref 0.50–1.10)
GFR calc Af Amer: >90 mL/min (ref >90)
GFR calc Af Amer: >90 mL/min (ref >90)
GFR calc non Af Amer: >90 mL/min (ref >90)
GFR calc non Af Amer: >90 mL/min (ref >90)
Glucose, Bld: 182 mg/dL — ABNORMAL HIGH (ref 70–99)
Glucose, Bld: 112 mg/dL — ABNORMAL HIGH (ref 70–99)
Potassium: 3.9 mmol/L (ref 3.5–5.1)
Potassium: 3.1 mmol/L — ABNORMAL LOW (ref 3.5–5.1)
SODIUM: 137 mmol/L (ref 135–145)
Sodium: 137 mmol/L (ref 135–145)

## 2014-03-05 LAB — URINE CULTURE: Colony Count: >=100000

## 2014-03-05 MED ORDER — POTASSIUM CHLORIDE CRYS ER 20 MEQ PO TBCR
40.0000 meq | EXTENDED_RELEASE_TABLET | Freq: Once | ORAL | Status: AC
  Administered 2014-03-05: 40 meq via ORAL
  Filled 2014-03-05: qty 2

## 2014-03-05 MED ORDER — INSULIN ASPART 100 UNIT/ML ~~LOC~~ SOLN
0.0000 [IU] | Freq: Three times a day (TID) | SUBCUTANEOUS | Status: DC
  Administered 2014-03-05: 8 [IU] via SUBCUTANEOUS
  Administered 2014-03-05: 11 [IU] via SUBCUTANEOUS

## 2014-03-05 MED ORDER — INFLUENZA VAC SPLIT QUAD 0.5 ML IM SUSY
0.5000 mL | PREFILLED_SYRINGE | INTRAMUSCULAR | Status: AC
  Administered 2014-03-06: 0.5 mL via INTRAMUSCULAR
  Filled 2014-03-05 (×2): qty 0.5

## 2014-03-05 MED ORDER — INSULIN GLARGINE 100 UNIT/ML ~~LOC~~ SOLN
15.0000 [IU] | Freq: Every day | SUBCUTANEOUS | Status: DC
  Administered 2014-03-05 – 2014-03-06 (×2): 15 [IU] via SUBCUTANEOUS
  Filled 2014-03-05 (×2): qty 0.15

## 2014-03-05 MED ORDER — INSULIN ASPART 100 UNIT/ML ~~LOC~~ SOLN: 1.0000 [IU] | Freq: Three times a day (TID) | SUBCUTANEOUS | Status: DC

## 2014-03-05 NOTE — Progress Notes (Signed)
Patient ID: [name], female   DOB: 02-Aug-1980, 34 y.o.   MRN: [id number] [facility] PROGRESS NOTE  LAVAEH BAU LFY:[id number] DOB: [birth year] DOA: 03/03/2014 PCP: [name], MD  Brief narrative:    34 -year-old female with past medical history diabetes, related gastroparesis who presented to [facility] ED with complaints of worsening generalized abdominal pain with associated nausea and multiple episodes of nonbloody vomiting. Patient reported these are typical of gastroparesis flare. She did not complain of fevers or diarrhea. She reported she usually takes Reglan and at this time he did not provide symptomatic relief. On admission, blood pressure was 80/35 but has improved to 106/57 with IV fluids. Heart rate was 108 - 126, T max 99.3 F and oxygen saturation 97% on room air. Blood work was significant for CBG 286, blood glucose 295, anion gap 31. She had small leukocytes on urinalysis. Patient was started on insulin drip per DKA protocol and she was admitted to stepdown unit for further management.   Assessment/Plan:    Principal Problem:   DKA (diabetic ketoacidoses) / diabetes mellitus, uncontrolled with complications of gastroparesis / high anion gap metabolic acidosis  Patient with uncontrolled diabetes mellitus. A1c in December 2015 was 11 indicating poor glycemic control.  Patient was admitted with CBG of 286, blood glucose 295, anion gap of 31. She was started on insulin drip per DKA protocol.   CBG's in past 24 hours: 129, 189, 261. AG closed, CO2 is 19.  Transition to insulin regimen per home dosing, lantus 15 units daily with SSI.  Appreciated diabetic coordinator consult and recommendations.  Transfer to medical floor today.  Active Problems:   Sepsis secondary to urinary tract infection  Sepsis criteria met on the admission with hypotension, tachycardia, low-grade fever. In addition patient had evidence of small leukocytes on urinalysis.  Started  ciprofloxacin 400 mg IV twice daily. Urine culture ordered.    Nausea and vomiting / diabetic gastroparesis  Likely triggered by uncontrolled diabetes  She ate breakfast this am and so far tolerated it well.   Continue supportive care with IV fluids, Reglan 10 mg IV every 8 hours scheduled, Protonix daily.       Diabetic neuropathy  Continue gabapentin 200 mg at bedtime    Hypokalemia  Due to GI losses  Supplemented    DVT Prophylaxis   Lovenox subcutaneous ordered  Code Status: Full.  Family Communication:  plan of care discussed with the patient Disposition Plan: transfer to medical floor today   IV access:  Peripheral IV  Procedures and diagnostic studies:    Dg Abd Acute W/chest  03/03/2014  Paucity of intra-abdominal bowel gas. No dilated bowel loops to suggest obstruction.     Medical Consultants:  None  Other Consultants:  Diabetic coordinator  IAnti-Infectives:   None     Leisa Lenz, MD  [facility] Pager [phone]  If 7PM-7AM, please contact night-coverage [email or web address] Password [facility] 03/05/2014, 9:44 AM   LOS: 2 days    HPI/Subjective: No acute overnight events.  Objective: Filed Vitals:   03/05/14 0500 03/05/14 0600 03/05/14 0700 03/05/14 0800  BP: 94/55 106/72 109/71   Pulse:      Temp:    98.6 F (37 C)  TempSrc:    Oral  Resp: _0 Height:      Weight:      SpO2:        Intake/Output Summary (Last 24 hours) at 03/05/14 0944 Last data filed at 03/05/14 [phone]  Gross per 24 hour  Intake 2826.6 ml  Output   2325 ml  Net  501.6 ml    Exam:   General:  Pt is alert, follows commands appropriately, not in acute distress  Cardiovascular: Regular rate and rhythm, S1/S2, no murmurs  Respiratory: Clear to auscultation bilaterally, no wheezing, no crackles, no rhonchi  Abdomen: Soft, non tender, non distended, bowel sounds present  Extremities: No edema, pulses DP and PT palpable bilaterally  Neuro: Grossly  nonfocal  Data Reviewed: Basic Metabolic Panel:  Recent Labs Lab 03/03/14 2310  03/04/14 1334 03/04/14 1734 03/04/14 2200 03/05/14 0130 03/05/14 0515  NA  --   < > 132* 130* 134* 137 137  K  --   < > 3.8 3.5 4.4 3.9 3.1*  CL  --   < > 109 110 113* 116* 112  CO2  --   < > 18* 18* 18* 18* 19  GLUCOSE  --   < > 141* 150* 160* 182* 112*  BUN  --   < > _0 5*  CREATININE  --   < > 0.61 0.63 0.55 0.61 0.61  CALCIUM  --   < > 8.0* 7.7* 7.7* 7.8* 8.3*  MG 1.3*  --   --   --   --   --   --   < > = values in this interval not displayed. Liver Function Tests:  Recent Labs Lab 03/03/14 1631  AST 72*  ALT 30  ALKPHOS 78  BILITOT 1.1  PROT 8.1  ALBUMIN 4.9    Recent Labs Lab 03/03/14 1631  LIPASE 14   No results for input(s): AMMONIA in the last 168 hours. CBC:  Recent Labs Lab 03/03/14 1631  WBC 6.8  NEUTROABS 4.9  HGB 14.5  HCT 42.1  MCV 92.1  PLT 326   Cardiac Enzymes: No results for input(s): CKTOTAL, CKMB, CKMBINDEX, TROPONINI in the last 168 hours. BNP: Invalid input(s): POCBNP CBG:  Recent Labs Lab 03/05/14 0334 03/05/14 0500 03/05/14 0607 03/05/14 0736 03/05/14 0904  GLUCAP 129* 130* 129* 189* 261*    Recent Results (from the past 240 hour(s))  MRSA PCR Screening     Status: None   Collection Time: 03/03/14 10:41 PM  Result Value Ref Range Status   MRSA by PCR NEGATIVE NEGATIVE Final     Scheduled Meds: . amitriptyline  50 mg Oral QHS  . ciprofloxacin  400 mg Intravenous Q12H  . enoxaparin (LOVENOX) injection  40 mg Subcutaneous Q24H  . gabapentin  200 mg Oral QHS  . insulin glargine  15 Units Subcutaneous Daily  . metoCLOPramide (REGLAN) injection  10 mg Intravenous 3 times per day  . pantoprazole (PROTONIX) IV  40 mg Intravenous Q24H   Continuous Infusions: . sodium chloride Stopped (03/04/14 0215)  . dextrose 5 % and 0.45% NaCl 100 mL/hr at 03/05/14 0600

## 2014-03-05 NOTE — Progress Notes (Signed)
Pt stable for transfer for Med-surg. Glucose stabilizer d/c this AM and pt CBGs monitored AC/HS now with pt tolerating carb mod diet. VSS. Pt ambulatory and pain controlled by Vicodin and Dilaudid. Pt to be escorted off of unit in wheelchair to 3W. Full report given to 3W RN Fred. No issues or concerns at this time.

## 2014-03-06 DIAGNOSIS — A401 Sepsis due to streptococcus, group B: Secondary | ICD-10-CM

## 2014-03-06 DIAGNOSIS — N390 Urinary tract infection, site not specified: Secondary | ICD-10-CM

## 2014-03-06 LAB — BASIC METABOLIC PANEL
Anion gap: 7 (ref 5–15)
BUN: 5 mg/dL — AB (ref 6–23)
CHLORIDE: 101 meq/L (ref 96–112)
CO2: 28 mmol/L (ref 19–32)
Calcium: 8.7 mg/dL (ref 8.4–10.5)
Creatinine, Ser: 0.52 mg/dL (ref 0.50–1.10)
GFR calc Af Amer: >90 mL/min (ref >90)
GFR calc non Af Amer: >90 mL/min (ref >90)
GLUCOSE: 269 mg/dL — AB (ref 70–99)
Potassium: 4.5 mmol/L (ref 3.5–5.1)
Sodium: 136 mmol/L (ref 135–145)

## 2014-03-06 LAB — GLUCOSE, CAPILLARY: GLUCOSE-CAPILLARY: 178 mg/dL — AB (ref 70–99)

## 2014-03-06 MED ORDER — INSULIN ASPART 100 UNIT/ML ~~LOC~~ SOLN: 0.0000 [IU] | Freq: Every day | SUBCUTANEOUS | Status: DC

## 2014-03-06 MED ORDER — AMOXICILLIN 500 MG PO CAPS: 500.0000 mg | ORAL_CAPSULE | Freq: Three times a day (TID) | ORAL | Status: DC

## 2014-03-06 MED ORDER — INSULIN ASPART 100 UNIT/ML ~~LOC~~ SOLN
0.0000 [IU] | Freq: Three times a day (TID) | SUBCUTANEOUS | Status: DC
  Administered 2014-03-06: 4 [IU] via SUBCUTANEOUS
  Administered 2014-03-06: 7 [IU] via SUBCUTANEOUS

## 2014-03-06 MED ORDER — INSULIN ASPART 100 UNIT/ML ~~LOC~~ SOLN
4.0000 [IU] | Freq: Three times a day (TID) | SUBCUTANEOUS | Status: DC
  Administered 2014-03-06 (×2): 4 [IU] via SUBCUTANEOUS

## 2014-03-06 NOTE — Discharge Instructions (Signed)
Correction Insulin Your health care provider has decided you need to take insulin regularly. You have been given a correction scale (also called a sliding scale) in case you need extra insulin when your blood sugar is too high (hyperglycemia). The following instructions will assist you in how to use that correction scale.  WHAT IS A CORRECTION SCALE?  When you check your blood sugar, sometimes it will be higher than your health care provider has told you it should be. You may need an extra dose of insulin to bring your blood sugar to the recommended level (also known as your goal, target, or normal level). The correction scale is prescribed by your health care provider based on your specific needs.  Your correction scale has two parts:   The first shows you a blood sugar range.   The second part tells you how much extra insulin to give yourself if your blood sugar falls within this range. You will not need an extra dose of insulin if your blood glucose is in the desired range. You should simply give yourself the normal amount of insulin that your health care provider has ordered for you.  WHY IS IT IMPORTANT TO KEEP YOUR BLOOD SUGAR LEVELS AT YOUR DESIRED LEVEL?  Keeping your blood sugar at the desired level helps to prevent long-term complications of diabetes, such as eye disease, kidney failure, nerve damage, and other serious complications. WHAT TYPE OF INSULIN WILL YOU USE?  To help bring down blood sugar levels that are too high, your health care provider will prescribe a short-acting or a rapid-acting insulin. An example of a short-acting insulin would be regular insulin. Remember, you may also have a longer-acting insulin prescribed for you.  WHAT DO YOU NEED TO DO?   Check your blood sugar with your home blood glucose meter as recommended by your health care provider.   Using your correction scale, find the range that your blood sugar lies in.   Look for the units of insulin  that match that blood sugar range. Give yourself the dose of correction insulin your health care provider has prescribed. Always make sure you are using the right type of insulin.   Prior to the injection, make sure you have food available that you can eat in the next 15-30 minutes.   If your correction insulin is rapid acting, start eating your meal within 15 minutes after you have given yourself the insulin injection. If you wait longer than 15 minutes to eat, your blood sugar might get too low.   If your correction insulin is short acting(regular), start eating your meal within 30 minutes after you have given yourself the insulin injection. If you wait longer than 30 minutes to eat, your blood sugar might get too low. Symptoms of low blood sugar (hypoglycemia) may include feeling shaky or weak, sweating, feeling confused, difficulty seeing, agitation, crankiness, or numbness of the lips or tongue. Check your blood sugar immediately and treat your results as directed by your health care provider.   Keep a log of your blood sugar results with the time you took the test and the amount of insulin that you injected. This information will help your health care provider manage your medicines.   Note on your log anything that may affect your blood sugar level, such as:   Changes in normal exercise or activity.   Changes in your normal schedule, such as staying up late, going on vacation, changing your diet, or holidays.   New  medicines. This includes prescription and over-the-counter medicines. Some medicines may cause high blood sugar.   Sickness, stress, or anxiety.   Changes in the time you took your medicine.   Changes in your meals, such as skipping a meal, having a late meal, or dining out.   Eating things that may affect blood glucose, such as snacks, meal portions that are larger than normal, drinks with sugar, or eating less than usual.   Ask your health care provider any  questions you have.  Be aware of "stacking" your insulin doses. This happens when you correct a high blood sugar level by giving yourself extra insulin too soon after a previous correction dose or mealtime dose. You may then have too much insulin still active in your body and may be at risk for hypoglycemia. WHY DO YOU NEED A CORRECTION SCALE IF YOU HAVE NEVER BEEN DIAGNOSED WITH DIABETES?   Keeping your blood glucose in the target range is important for your overall health.   You may have been prescribed medicines that cause your blood glucose to be higher than normal. WHEN SHOULD YOU SEEK MEDICAL CARE? Contact your health care provider if:   You have experienced hypoglycemia that you are unable to treat with your usual routine.   You have a high blood sugar level that is not coming down with the correction dose.  Your blood sugar is often too low or does not come up even if you eat a fast-acting carbohydrate. Someone who lives with you should seek immediate medical care if you become unresponsive. Document Released: 07/02/2010 Document Revised: 10/11/2012 Document Reviewed: 07/21/2012 [facility] Patient Information 2015 [place], [place]. This information is not intended to replace advice given to you by your health care provider. Make sure you discuss any questions you have with your health care provider.  Diabetic Ketoacidosis Diabetic ketoacidosis (DKA) is a life-threatening complication of type 1 diabetes. It must be quickly recognized and treated. Treatment requires hospitalization. CAUSES  When there is no insulin in the body, glucose (sugar) cannot be used, and the body breaks down fat for energy. When fat breaks down, acids (ketones) build up in the blood. Very high levels of glucose and high levels of acids lead to severe loss of body fluids (dehydration) and other dangerous chemical changes. This stresses your vital organs and can cause coma or death. SIGNS AND SYMPTOMS    Tiredness (fatigue).  Weight loss.  Excessive thirst.  Ketones in your urine.  Light-headedness.  Fruity or sweet smelling breath.  Excessive urination.  Visual changes.  Confusion or irritability.  Nausea or vomiting.  Rapid breathing.  Stomachache or abdominal pain. DIAGNOSIS  Your health care provider will diagnose DKA based on your history, physical exam, and blood tests. The health care provider will check to see if you have another illness that caused you to go into DKA. Most of this will be done quickly in an emergency room. TREATMENT   Fluid replacement to correct dehydration.  Insulin.  Correction of electrolytes, such as potassium and sodium.  Antibiotic medicines. PREVENTION  Always take your insulin. Do not skip your insulin injections.  If you are sick, treat yourself quickly. Your body often needs more insulin to fight the illness.  Check your blood glucose regularly.  Check urine ketones if your blood glucose is greater than 240 milligrams per deciliter (mg/dL).  Do not use outdated (expired) insulin.  If your blood glucose is high, drink plenty of fluids. This helps flush out ketones. HOME  CARE INSTRUCTIONS   If you are sick, follow the advice of your health care provider.  To prevent dehydration, drink enough water and fluids to keep your urine clear or pale yellow.  If you cannot eat, alternate between drinking fluids with sugar (soda, juices, flavored gelatin) and salty fluids (broth, bouillon).  If you can eat, follow your usual diet and drink sugar-free liquids (water, diet drinks).  Always take your usual dose of insulin. If you cannot eat or if your glucose is getting too low, call your health care provider for further instructions.  Continue to monitor your blood or urine ketones every 3-4 hours around the clock. Set your alarm clock or have someone wake you up. If you are too sick, have someone test it for you.  Rest and avoid  exercise. SEEK MEDICAL CARE IF:   You have a fever.  You have ketones in your urine, or your blood glucose is higher than a level your health care provider suggests. You may need extra insulin. Call your health care provider if you need advice on adjusting your insulin.  You cannot drink at least a tablespoon (15 mL) of fluid every 15-20 minutes.  You have been vomiting for more than 2 hours.  You have symptoms of DKA:  Fruity smelling breath.  Breathing faster or slower.  Becoming very sleepy. SEEK IMMEDIATE MEDICAL CARE IF:   You have signs of dehydration:  Decreased urination.  Increased thirst.  Dry skin and mouth.  Light-headedness.  Your blood glucose is very high (as advised by your health care provider) twice in a row.  You faint.  You have chest pain or trouble breathing.  You have a sudden, severe headache.  You have sudden weakness in one arm or one leg.  You have sudden trouble speaking or swallowing.  You have vomiting or diarrhea that is getting worse after 3 hours.  You have abdominal pain. MAKE SURE YOU:   Understand these instructions.  Will watch your condition.  Will get help right away if you are not doing well or get worse. Document Released: 02/06/2000 Document Revised: 02/13/2013 Document Reviewed: 08/14/2008 [facility] Patient Information 2015 [place], [place]. This information is not intended to replace advice given to you by your health care provider. Make sure you discuss any questions you have with your health care provider.

## 2014-03-06 NOTE — Discharge Summary (Signed)
Physician Discharge Summary  [name] JKD:[id number] DOB: 02-May-1980 DOA: 03/03/2014  PCP: [name], MD  Admit date: 03/03/2014 Discharge date: 03/06/2014   Recommendations for Outpatient Follow-Up:   1. Recommend close outpatient follow-up of glycemic control.   Discharge Diagnosis:   Principal Problem:    DKA (diabetic ketoacidoses) Active Problems:    Nausea and vomiting    DM gastroparesis    DM (diabetes mellitus), type 1, uncontrolled    Abdominal pain, generalized    Diabetic gastroparesis associated with type 1 diabetes mellitus    Strep Algalactiae UTI    Sepsis secondary to UTI    Diabetic neuropathy   Discharge Condition: Improved.  Diet recommendation: Carbohydrate-modified.   History of Present Illness:   [name] is an 34 y.o. female with past medical history diabetes, related gastroparesis who presented to [facility] ED with complaints of worsening generalized abdominal pain with associated nausea and multiple episodes of nonbloody vomiting. Patient reported these are typical of gastroparesis flare. She did not complain of fevers or diarrhea. She reported she usually takes Reglan and at this time he did not provide symptomatic relief.On admission, blood pressure was 80/35 but has improved to 106/57 with IV fluids. Heart rate was 108 - 126, T max 99.3 F and oxygen saturation 97% on room air. Blood work was significant for CBG 286, blood glucose 295, anion gap 31. She had small leukocytes on urinalysis. Patient was started on insulin drip per DKA protocol and she was admitted to stepdown unit for further management.   Hospital Course by Problem:   Principal Problem:  DKA (diabetic ketoacidoses) / diabetes mellitus, uncontrolled with complications of gastroparesis / high anion gap metabolic acidosis  Patient with uncontrolled diabetes mellitus. A1c in December 2015 was 11 indicating poor glycemic control.  Patient was admitted with CBG of  286, blood glucose 295, anion gap of 31. She was started on insulin drip per DKA protocol.   Transitioned to home insulin regimen 03/05/14, lantus 15 units daily with SSI.  Seen and counseled by the diabetic coordinator.  Active Problems:  Sepsis secondary to urinary tract infection  Sepsis criteria met on the admission with hypotension, tachycardia, low-grade fever. In addition patient had evidence of small leukocytes on urinalysis.  Treated with IV Cipro empirically until culture results finalized. Urine culture positive for strep. Discharge on amoxicillin.   Nausea and vomiting / diabetic gastroparesis  Likely triggered by uncontrolled diabetes  Continue supportive care with IV fluids, Reglan 10 mg IV every 8 hours scheduled, Protonix daily.    Diabetic neuropathy  Continue gabapentin 200 mg at bedtime   Hypokalemia  Due to GI losses  Supplemented    Medical Consultants:    None.   Discharge Exam:   Filed Vitals:   03/06/14 0649  BP: 114/73  Pulse: 100  Temp: 98.6 F (37 C)  Resp: 16   Filed Vitals:   03/05/14 1338 03/05/14 1742 03/05/14 2129 03/06/14 0649  BP: 103/75 108/70 110/73 114/73  Pulse: 97 100 93 100  Temp: 98.3 F (36.8 C) 98.4 F (36.9 C) 98.4 F (36.9 C) 98.6 F (37 C)  TempSrc: Oral Oral Oral Oral  Resp: 18 16 16 16   Height: 5' 5"  (1.651 m)     Weight: 55.6 kg (122 lb 9.2 oz)     SpO2: 100% 99% 98% 100%    Gen:  NAD Cardiovascular:  RRR, No M/R/G Respiratory: Lungs CTAB Gastrointestinal: Abdomen soft, NT/ND with normal active bowel sounds. Extremities:  No C/E/C   The results of significant diagnostics from this hospitalization (including imaging, microbiology, ancillary and laboratory) are listed below for reference.     Procedures and Diagnostic Studies:      Labs:   Basic Metabolic Panel:  Recent Labs Lab 03/03/14 2310  03/04/14 1734 03/04/14 2200 03/05/14 0130 03/05/14 0515 03/06/14 0600  NA  --   < >  130* 134* 137 137 136  K  --   < > 3.5 4.4 3.9 3.1* 4.5  CL  --   < > 110 113* 116* 112 101  CO2  --   < > 18* 18* 18* 19 28  GLUCOSE  --   < > 150* 160* 182* 112* 269*  BUN  --   < > 8 7 6  5* 5*  CREATININE  --   < > 0.63 0.55 0.61 0.61 0.52  CALCIUM  --   < > 7.7* 7.7* 7.8* 8.3* 8.7  MG 1.3*  --   --   --   --   --   --   < > = values in this interval not displayed. GFR Estimated Creatinine Clearance: 87 mL/min (by C-G formula based on Cr of 0.52). Liver Function Tests:  Recent Labs Lab 03/03/14 1631  AST 72*  ALT 30  ALKPHOS 78  BILITOT 1.1  PROT 8.1  ALBUMIN 4.9    Recent Labs Lab 03/03/14 1631  LIPASE 14   CBC:  Recent Labs Lab 03/03/14 1631  WBC 6.8  NEUTROABS 4.9  HGB 14.5  HCT 42.1  MCV 92.1  PLT 326   CBG:  Recent Labs Lab 03/05/14 1205 03/05/14 1350 03/05/14 1703 03/05/14 2056 03/06/14 1317  GLUCAP 324* 223* 253* 163* 178*   Microbiology Recent Results (from the past 240 hour(s))  Culture, Urine     Status: None   Collection Time: 03/03/14  5:07 PM  Result Value Ref Range Status   Specimen Description URINE, CLEAN CATCH  Final   Special Requests NONE  Final   Colony Count   Final    >=100,000 COLONIES/ML Performed at [facility]    Culture   Final    GROUP B STREP(S.AGALACTIAE)ISOLATED Note: TESTING AGAINST S. AGALACTIAE NOT ROUTINELY PERFORMED DUE TO PREDICTABILITY OF AMP/PEN/VAN SUSCEPTIBILITY. Performed at [facility]    Report Status 03/05/2014 FINAL  Final  MRSA PCR Screening     Status: None   Collection Time: 03/03/14 10:41 PM  Result Value Ref Range Status   MRSA by PCR NEGATIVE NEGATIVE Final    Comment:        The GeneXpert MRSA Assay (FDA approved for NASAL specimens only), is one component of a comprehensive MRSA colonization surveillance program. It is not intended to diagnose MRSA infection nor to guide or monitor treatment for MRSA infections.      Discharge Instructions:         Discharge Instructions    Call MD for:  extreme fatigue    Complete by:  As directed      Call MD for:  persistant nausea and vomiting    Complete by:  As directed      Call MD for:  severe uncontrolled pain    Complete by:  As directed      Call MD for:    Complete by:  As directed   Persistently elevated blood glucoses greater than 250, excessive thirst/urination, or weight loss.     Diet Carb Modified    Complete  by:  As directed      Discharge instructions    Complete by:  As directed   When you are sick, your body becomes insulin resistant and your insulin needs go up.  You will likely need to take more insulin to keep your sugars under control over the next few days.  Check your blood sugars before each meal and at bedtime. Take 4 units of insulin with every meal in addition to the following sliding scale. CBG < 70: Drink juice; CBG 70 - 120: 0 units: CBG 121 - 150: 2 units; CBG 151 - 200: 3 units; CBG 201 - 250: 5 units; CBG 251 - 300: 8 units;CBG 301 - 350: 11 units; CBG 351 - 400: 15 units; CBG > 400 Call MD.  So, for example, if you are planning to eat a meal, take 4 units to cover the meal, and if your blood sugars 252, take an additional 8 units (total of 12 units).  As your blood sugars normalize, he may need to cut back on the amount of insulin you are using. If you experience lows, stop the meal coverage and only use the sliding scale. If you still are experiencing lows, go back to the regimen your endocrinologist recommended.     Increase activity slowly    Complete by:  As directed             Medication List    STOP taking these medications        omeprazole 40 MG capsule  Commonly known as:  PRILOSEC      TAKE these medications        alum & mag hydroxide-simeth 200-200-20 MG/5ML suspension  Commonly known as:  MAALOX/MYLANTA  Take 30 mLs by mouth every 6 (six) hours as needed for indigestion or heartburn.     amitriptyline 50 MG tablet  Commonly known  as:  ELAVIL  Take 50 mg by mouth at bedtime.     amoxicillin 500 MG capsule  Commonly known as:  AMOXIL  Take 1 capsule (500 mg total) by mouth 3 (three) times daily.     cloNIDine 0.1 MG tablet  Commonly known as:  CATAPRES  Take 0.1 mg by mouth daily.     famotidine 20 MG tablet  Commonly known as:  PEPCID  Take 1 tablet (20 mg total) by mouth 2 (two) times daily.     gabapentin 100 MG capsule  Commonly known as:  NEURONTIN  Take 200 mg by mouth at bedtime.     glucagon 1 MG injection  Commonly known as:  GLUCAGON EMERGENCY  Inject 1 mg into the muscle once as needed (severe hypoglycemia).     HYDROcodone-acetaminophen 5-325 MG per tablet  Commonly known as:  NORCO/VICODIN  Take 1 tablet by mouth every 6 (six) hours as needed for moderate pain.     insulin aspart 100 UNIT/ML injection  Commonly known as:  novoLOG  - Inject 1-3 Units into the skin 3 (three) times daily with meals. CBG < 70: implement hypoglycemia protocol  - CBG > 400: call MD   -   - CBG 70 - 120: 0 units  - CBG 121 - 150: 3 units  - CBG 151 - 200: 4 units  - CBG 201 - 250: 7 units  - CBG 251 - 300: 11 units  - CBG 301 - 350: 15 units  - CBG 351 - 400: 20 units  - CBG > 400: call MD  insulin glargine 100 UNIT/ML injection  Commonly known as:  LANTUS  Inject 0.15 mLs (15 Units total) into the skin every morning.     lisinopril 2.5 MG tablet  Commonly known as:  PRINIVIL,ZESTRIL  Take 2.5 mg by mouth daily.     metoCLOPramide 10 MG tablet  Commonly known as:  REGLAN  Take 1 tablet (10 mg total) by mouth 4 (four) times daily -  before meals and at bedtime.     nystatin-triamcinolone cream  Commonly known as:  MYCOLOG II  Apply 1 application topically 2 (two) times daily.     ondansetron 8 MG disintegrating tablet  Commonly known as:  ZOFRAN-ODT  Take 8 mg by mouth every 8 (eight) hours as needed for nausea or vomiting (nausea & vomiting).     pantoprazole 40 MG tablet    Commonly known as:  PROTONIX  Take 1 tablet (40 mg total) by mouth daily.      ASK your doctor about these medications        multivitamin with minerals Tabs tablet  Take 1 tablet by mouth every morning. Diabetic support vitamin       Follow-up Information    Follow up with [facility], [place]. Schedule an appointment as soon as possible for a visit in 2 weeks.   Specialty:  Internal Medicine   Why:  To follow-up on blood sugar control.   Contact information:   [address] [place] [address] [phone]        Time coordinating discharge: 35 minutes.  Signed:  Kirstyn Lean  Pager [phone] [facility] 03/06/2014, 2:32 PM

## 2014-03-06 NOTE — Care Management Note (Signed)
CARE MANAGEMENT NOTE 03/06/2014  Patient:  [name], [name]   Account Number:  [id number]  Date Initiated:  03/06/2014  Documentation initiated by:  Marney Doctor  Subjective/Objective Assessment:   34 yo admitted with DKA     Action/Plan:   From home with children   Anticipated DC Date:  03/06/2014   Anticipated DC Plan:  [place]  CM consult      Choice offered to / List presented to:             Status of service:  Completed, signed off Medicare Important Message given?   (If response is "NO", the following Medicare IM given date fields will be blank) Date Medicare IM given:   Medicare IM given by:   Date Additional Medicare IM given:   Additional Medicare IM given by:    Discharge Disposition:    Per UR Regulation:  Reviewed for med. necessity/level of care/duration of stay  If discussed at [place] of Stay Meetings, dates discussed:    Comments:  03/06/14 Marney Doctor RN,BSN,NCM [phone] Met with pt to assess for DC needs.  Pt states that she has [name] PCP and is current with her Medicaid.  She states she has [name] CBG meter, lancets and strips at home currently and does not feel there is anything else she needs.  Pt states she has very close contact with her PCP and she is just [name] "brittle diabetic".  Pt appreciative of CM involvement.

## 2014-03-08 LAB — GLUCOSE, CAPILLARY: GLUCOSE-CAPILLARY: 231 mg/dL — AB (ref 70–99)

## 2014-03-22 ENCOUNTER — Ambulatory Visit: Payer: Medicaid Other | Admitting: Internal Medicine

## 2014-03-25 ENCOUNTER — Ambulatory Visit (INDEPENDENT_AMBULATORY_CARE_PROVIDER_SITE_OTHER): Payer: Medicaid Other | Admitting: Internal Medicine

## 2014-03-25 ENCOUNTER — Encounter: Payer: Self-pay | Admitting: Internal Medicine

## 2014-03-25 VITALS — BP 100/58 | HR 111 | Temp 98.4°F | Resp 12 | Wt 106.0 lb

## 2014-03-25 DIAGNOSIS — E1065 Type 1 diabetes mellitus with hyperglycemia: Secondary | ICD-10-CM

## 2014-03-25 DIAGNOSIS — E079 Disorder of thyroid, unspecified: Secondary | ICD-10-CM

## 2014-03-25 DIAGNOSIS — IMO0002 Reserved for concepts with insufficient information to code with codable children: Secondary | ICD-10-CM

## 2014-03-25 NOTE — Patient Instructions (Signed)
Patient Instructions  Please continue Lantus 15 units in a.m. Continue mealtime Novolog: 1 unit for 1 carb choice (15g of carbs) Change the sliding scale: - 150-200: + 1 unit - 201-250: + 2 units - >250: + 3 units  Please return in 1.5 months (after 03/13/6)  with your sugar log.   Check sugars at least 4x a day.
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
## 2014-03-28 ENCOUNTER — Encounter (HOSPITAL_COMMUNITY): Payer: Self-pay | Admitting: Emergency Medicine

## 2014-03-28 ENCOUNTER — Emergency Department (HOSPITAL_COMMUNITY)
Admission: EM | Admit: 2014-03-28 | Discharge: 2014-03-28 | Disposition: A | Discharge status: Home / Self Care | Payer: Medicaid Other | Attending: Emergency Medicine | Admitting: Emergency Medicine

## 2014-03-28 DIAGNOSIS — Z794 Long term (current) use of insulin: Secondary | ICD-10-CM | POA: Insufficient documentation

## 2014-03-28 DIAGNOSIS — Z91040 Latex allergy status: Secondary | ICD-10-CM | POA: Diagnosis not present

## 2014-03-28 DIAGNOSIS — J029 Acute pharyngitis, unspecified: Secondary | ICD-10-CM | POA: Insufficient documentation

## 2014-03-28 DIAGNOSIS — R21 Rash and other nonspecific skin eruption: Secondary | ICD-10-CM | POA: Insufficient documentation

## 2014-03-28 DIAGNOSIS — E1143 Type 2 diabetes mellitus with diabetic autonomic (poly)neuropathy: Secondary | ICD-10-CM | POA: Diagnosis not present

## 2014-03-28 DIAGNOSIS — F1110 Opioid abuse, uncomplicated: Secondary | ICD-10-CM | POA: Diagnosis not present

## 2014-03-28 DIAGNOSIS — E1165 Type 2 diabetes mellitus with hyperglycemia: Secondary | ICD-10-CM | POA: Insufficient documentation

## 2014-03-28 DIAGNOSIS — R1084 Generalized abdominal pain: Secondary | ICD-10-CM | POA: Diagnosis not present

## 2014-03-28 DIAGNOSIS — Z79899 Other long term (current) drug therapy: Secondary | ICD-10-CM | POA: Diagnosis not present

## 2014-03-28 DIAGNOSIS — K219 Gastro-esophageal reflux disease without esophagitis: Secondary | ICD-10-CM | POA: Insufficient documentation

## 2014-03-28 DIAGNOSIS — N189 Chronic kidney disease, unspecified: Secondary | ICD-10-CM | POA: Diagnosis not present

## 2014-03-28 DIAGNOSIS — D649 Anemia, unspecified: Secondary | ICD-10-CM | POA: Insufficient documentation

## 2014-03-28 DIAGNOSIS — Z3202 Encounter for pregnancy test, result negative: Secondary | ICD-10-CM | POA: Insufficient documentation

## 2014-03-28 DIAGNOSIS — F410 Panic disorder [episodic paroxysmal anxiety] without agoraphobia: Secondary | ICD-10-CM | POA: Insufficient documentation

## 2014-03-28 DIAGNOSIS — R739 Hyperglycemia, unspecified: Secondary | ICD-10-CM

## 2014-03-28 LAB — URINE MICROSCOPIC-ADD ON

## 2014-03-28 LAB — COMPREHENSIVE METABOLIC PANEL
ALBUMIN: 4.7 g/dL (ref 3.5–5.2)
ALT: 29 U/L (ref 0–35)
AST: 32 U/L (ref 0–37)
Alkaline Phosphatase: 69 U/L (ref 39–117)
Anion gap: 18 — ABNORMAL HIGH (ref 5–15)
BILIRUBIN TOTAL: 1 mg/dL (ref 0.3–1.2)
BUN: 20 mg/dL (ref 6–23)
CALCIUM: 9.9 mg/dL (ref 8.4–10.5)
CO2: 23 mmol/L (ref 19–32)
Chloride: 90 mmol/L — ABNORMAL LOW (ref 96–112)
Creatinine, Ser: 0.79 mg/dL (ref 0.50–1.10)
Glucose, Bld: 408 mg/dL — ABNORMAL HIGH (ref 70–99)
Potassium: 4 mmol/L (ref 3.5–5.1)
SODIUM: 131 mmol/L — AB (ref 135–145)
Total Protein: 7.6 g/dL (ref 6.0–8.3)

## 2014-03-28 LAB — CBC
HEMATOCRIT: 38.4 % (ref 36.0–46.0)
Hemoglobin: 13.4 g/dL (ref 12.0–15.0)
MCH: 31.4 pg (ref 26.0–34.0)
MCHC: 34.9 g/dL (ref 30.0–36.0)
MCV: 89.9 fL (ref 78.0–100.0)
Platelets: 293 10*3/uL (ref 150–400)
RBC: 4.27 MIL/uL (ref 3.87–5.11)
RDW: 13.3 % (ref 11.5–15.5)
WBC: 5.7 10*3/uL (ref 4.0–10.5)

## 2014-03-28 LAB — URINALYSIS, ROUTINE W REFLEX MICROSCOPIC
Glucose, UA: >1000 mg/dL — AB
HGB URINE DIPSTICK: NEGATIVE
Leukocytes, UA: NEGATIVE
Nitrite: NEGATIVE
PH: 5.5 (ref 5.0–8.0)
Protein, ur: NEGATIVE mg/dL
SPECIFIC GRAVITY, URINE: 1.045 — AB (ref 1.005–1.030)
Urobilinogen, UA: 0.2 mg/dL (ref 0.0–1.0)

## 2014-03-28 LAB — RAPID STREP SCREEN (MED CTR MEBANE ONLY): Streptococcus, Group A Screen (Direct): NEGATIVE

## 2014-03-28 LAB — RAPID URINE DRUG SCREEN, HOSP PERFORMED
AMPHETAMINES: NOT DETECTED
BARBITURATES: NOT DETECTED
Benzodiazepines: NOT DETECTED
COCAINE: NOT DETECTED
OPIATES: POSITIVE — AB
Tetrahydrocannabinol: NOT DETECTED

## 2014-03-28 LAB — CBG MONITORING, ED
GLUCOSE-CAPILLARY: 132 mg/dL — AB (ref 70–99)
GLUCOSE-CAPILLARY: 81 mg/dL (ref 70–99)
Glucose-Capillary: 410 mg/dL — ABNORMAL HIGH (ref 70–99)

## 2014-03-28 LAB — PREGNANCY, URINE: Preg Test, Ur: NEGATIVE

## 2014-03-28 MED ORDER — HYDROMORPHONE HCL 1 MG/ML IJ SOLN
1.0000 mg | Freq: Once | INTRAMUSCULAR | Status: AC
  Administered 2014-03-28: 1 mg via INTRAVENOUS
  Filled 2014-03-28: qty 1

## 2014-03-28 MED ORDER — ONDANSETRON HCL 4 MG/2ML IJ SOLN
4.0000 mg | Freq: Once | INTRAMUSCULAR | Status: AC
  Administered 2014-03-28: 4 mg via INTRAVENOUS
  Filled 2014-03-28: qty 2

## 2014-03-28 MED ORDER — INSULIN ASPART 100 UNIT/ML ~~LOC~~ SOLN
10.0000 [IU] | Freq: Once | SUBCUTANEOUS | Status: AC
  Administered 2014-03-28: 10 [IU] via INTRAVENOUS
  Filled 2014-03-28: qty 1

## 2014-03-28 MED ORDER — SODIUM CHLORIDE 0.9 % IV SOLN
INTRAVENOUS | Status: DC
  Administered 2014-03-28: 12:00:00 via INTRAVENOUS

## 2014-03-28 MED ORDER — SODIUM CHLORIDE 0.9 % IV BOLUS (SEPSIS)
1000.0000 mL | Freq: Once | INTRAVENOUS | Status: AC
  Administered 2014-03-28: 1000 mL via INTRAVENOUS

## 2014-03-28 NOTE — ED Provider Notes (Signed)
CSN: [id number]     Arrival date & time 03/28/14  4098 History   First MD Initiated Contact with Patient 03/28/14 1004     Chief Complaint  Patient presents with  . Hyperglycemia     (Consider location/radiation/quality/duration/timing/severity/associated sxs/prior Treatment) Patient is a 34 y.o. female presenting with hyperglycemia. The history is provided by the patient and the EMS personnel.  Hyperglycemia Associated symptoms: abdominal pain, nausea and vomiting   Associated symptoms: no chest pain, no confusion, no dysuria, no fever and no shortness of breath    patient with history of diabetes frequently goes into DKA. Patient also recently diagnosed with gastroparesis. Being treated with Reglan and Protonix for that. Patient's blood sugar at home was 437 she's had some diffuse abdominal pain not severe associated with nausea and vomiting. Patient concerned about having DKA. Denies any fevers. Also does have a sore throat and feels like she is coming down with an upper respiratory infection. Patient has had strep throat in the past.  Past Medical History  Diagnosis Date  . Diabetes mellitus   . Thyroid disease     hypothyroidism associated with pregnancy  . Back pain   . Panic attack   . PONV (postoperative nausea and vomiting)   . Anxiety   . GERD (gastroesophageal reflux disease)   . DM gastroparesis   . Chronic kidney disease   . Anemia    Past Surgical History  Procedure Laterality Date  . Laparoscopy      age 14  . Back surgery      age 79  . Tooth extraction    . Root canal  10/10/12   Family History  Problem Relation Age of Onset  . Diabetes Maternal Grandmother   . Cancer Maternal Grandfather     Small cell lung cancer  . Cancer Maternal Grandmother     Leukemia   History  Substance Use Topics  . Smoking status: Former Smoker    Quit date: 04/19/2000  . Smokeless tobacco: Former Systems developer  . Alcohol Use: Yes     Comment: RARE SIPS OF WINE   OB History     No data available     Review of Systems  Constitutional: Negative for fever.  HENT: Positive for sore throat. Negative for congestion.   Eyes: Negative for redness.  Respiratory: Negative for shortness of breath.   Cardiovascular: Negative for chest pain.  Gastrointestinal: Positive for nausea, vomiting and abdominal pain.  Genitourinary: Negative for dysuria.  Musculoskeletal: Negative for back pain.  Skin: Negative for rash.  Neurological: Negative for headaches.  Hematological: Does not bruise/bleed easily.  Psychiatric/Behavioral: Negative for confusion.      Allergies  Scallops; Morphine and related; and Latex  Home Medications   Prior to Admission medications   Medication Sig Start Date End Date Taking? Authorizing Provider  amitriptyline (ELAVIL) 50 MG tablet Take 50 mg by mouth at bedtime.   Yes Historical Provider, MD  clotrimazole-betamethasone (LOTRISONE) cream Apply 1 application topically daily as needed (rash).   Yes Historical Provider, MD  Cyanocobalamin (VITAMIN B-12 PO) Take 2 tablets by mouth 2 (two) times daily.   Yes Historical Provider, MD  gabapentin (NEURONTIN) 100 MG capsule Take 200 mg by mouth at bedtime.   Yes Historical Provider, MD  glucagon (GLUCAGON EMERGENCY) 1 MG injection Inject 1 mg into the muscle once as needed (severe hypoglycemia). 02/19/14  Yes Philemon Kingdom, MD  insulin aspart (NOVOLOG) 100 UNIT/ML injection Inject 1-3 Units into the skin 3 (  three) times daily with meals. CBG < 70: implement hypoglycemia protocol CBG > 400: call MD   CBG 70 - 120: 0 units CBG 121 - 150: 3 units CBG 151 - 200: 4 units CBG 201 - 250: 7 units CBG 251 - 300: 11 units CBG 301 - 350: 15 units CBG 351 - 400: 20 units CBG > 400: call MD 02/01/13  Yes Donita Brooks, NP  insulin glargine (LANTUS) 100 UNIT/ML injection Inject 0.15 mLs (15 Units total) into the skin every morning. 02/19/14  Yes Philemon Kingdom, MD  Melatonin 3 MG TABS Take 1-2 tablets by  mouth at bedtime as needed (sleep).   Yes Historical Provider, MD  metoCLOPramide (REGLAN) 10 MG tablet Take 1 tablet (10 mg total) by mouth 4 (four) times daily -  before meals and at bedtime. 02/04/14  Yes Reyne Dumas, MD  Multiple Vitamin (MULTIVITAMIN WITH MINERALS) TABS Take 1 tablet by mouth every morning. Diabetic support vitamin   Yes Historical Provider, MD  nystatin-triamcinolone (MYCOLOG II) cream Apply 1 application topically 2 (two) times daily as needed (rash).    Yes Historical Provider, MD  omeprazole (PRILOSEC) 40 MG capsule Take 40 mg by mouth daily. 01/17/14  Yes Historical Provider, MD  ondansetron (ZOFRAN-ODT) 8 MG disintegrating tablet Take 8 mg by mouth every 8 (eight) hours as needed for nausea or vomiting (nausea & vomiting).    Yes Historical Provider, MD  oxyCODONE-acetaminophen (PERCOCET) 10-325 MG per tablet Take 1 tablet by mouth every 6 (six) hours as needed for pain.   Yes Historical Provider, MD  pantoprazole (PROTONIX) 40 MG tablet Take 1 tablet (40 mg total) by mouth daily. 02/04/14  Yes Reyne Dumas, MD  alum & mag hydroxide-simeth (MAALOX/MYLANTA) 200-200-20 MG/5ML suspension Take 30 mLs by mouth every 6 (six) hours as needed for indigestion or heartburn. Patient not taking: Reported on 03/25/2014 11/05/13   Nishant Dhungel, MD  amoxicillin (AMOXIL) 500 MG capsule Take 1 capsule (500 mg total) by mouth 3 (three) times daily. Patient not taking: Reported on 03/25/2014 03/06/14   Venetia Maxon Rama, MD  famotidine (PEPCID) 20 MG tablet Take 1 tablet (20 mg total) by mouth 2 (two) times daily. Patient not taking: Reported on 03/28/2014 11/05/13   Flonnie Overman Dhungel, MD  HYDROcodone-acetaminophen (NORCO/VICODIN) 5-325 MG per tablet Take 1 tablet by mouth every 6 (six) hours as needed for moderate pain. Patient not taking: Reported on 03/25/2014 02/04/14   Reyne Dumas, MD   BP 113/81 mmHg  Pulse 95  Temp(Src) 98 F (36.7 C) (Oral)  Resp 19  SpO2 97% Physical Exam   Constitutional: She is oriented to person, place, and time. She appears well-developed and well-nourished. No distress.  HENT:  Head: Normocephalic and atraumatic.  Mouth/Throat: Oropharynx is clear and moist.  Eyes: Conjunctivae and EOM are normal. Pupils are equal, round, and reactive to light.  Neck: Normal range of motion.  Cardiovascular: Normal rate, regular rhythm and normal heart sounds.   Pulmonary/Chest: Effort normal and breath sounds normal. No respiratory distress.  Abdominal: Soft. Bowel sounds are normal. There is no tenderness.  Musculoskeletal: Normal range of motion.  Neurological: She is alert and oriented to person, place, and time. No cranial nerve deficit. She exhibits normal muscle tone. Coordination normal.  Skin: Skin is warm. Rash noted.  Nursing note and vitals reviewed.   ED Course  Procedures (including critical care time) Labs Review Labs Reviewed  COMPREHENSIVE METABOLIC PANEL - Abnormal; Notable for the following:  Sodium 131 (*)    Chloride 90 (*)    Glucose, Bld 408 (*)    Anion gap 18 (*)    All other components within normal limits  URINE RAPID DRUG SCREEN (HOSP PERFORMED) - Abnormal; Notable for the following:    Opiates POSITIVE (*)    All other components within normal limits  URINALYSIS, ROUTINE W REFLEX MICROSCOPIC - Abnormal; Notable for the following:    APPearance CLOUDY (*)    Specific Gravity, Urine 1.045 (*)    Glucose, UA >1000 (*)    Bilirubin Urine MODERATE (*)    Ketones, ur >80 (*)    All other components within normal limits  CBG MONITORING, ED - Abnormal; Notable for the following:    Glucose-Capillary 410 (*)    All other components within normal limits  CBG MONITORING, ED - Abnormal; Notable for the following:    Glucose-Capillary 132 (*)    All other components within normal limits  RAPID STREP SCREEN  CULTURE, GROUP A STREP  CBC  PREGNANCY, URINE  URINE MICROSCOPIC-ADD ON  CBG MONITORING, ED   Results for  orders placed or performed during the hospital encounter of 03/28/14  Rapid strep screen  Result Value Ref Range   Streptococcus, Group A Screen (Direct) NEGATIVE NEGATIVE  CBC  Result Value Ref Range   WBC 5.7 4.0 - 10.5 K/uL   RBC 4.27 3.87 - 5.11 MIL/uL   Hemoglobin 13.4 12.0 - 15.0 g/dL   HCT 38.4 36.0 - 46.0 %   MCV 89.9 78.0 - 100.0 fL   MCH 31.4 26.0 - 34.0 pg   MCHC 34.9 30.0 - 36.0 g/dL   RDW 13.3 11.5 - 15.5 %   Platelets 293 150 - 400 K/uL  Comprehensive metabolic panel  Result Value Ref Range   Sodium 131 (L) 135 - 145 mmol/L   Potassium 4.0 3.5 - 5.1 mmol/L   Chloride 90 (L) 96 - 112 mmol/L   CO2 23 19 - 32 mmol/L   Glucose, Bld 408 (H) 70 - 99 mg/dL   BUN 20 6 - 23 mg/dL   Creatinine, Ser 0.79 0.50 - 1.10 mg/dL   Calcium 9.9 8.4 - 10.5 mg/dL   Total Protein 7.6 6.0 - 8.3 g/dL   Albumin 4.7 3.5 - 5.2 g/dL   AST 32 0 - 37 U/L   ALT 29 0 - 35 U/L   Alkaline Phosphatase 69 39 - 117 U/L   Total Bilirubin 1.0 0.3 - 1.2 mg/dL   GFR calc non Af Amer >90 >90 mL/min   GFR calc Af Amer >90 >90 mL/min   Anion gap 18 (H) 5 - 15  Urine rapid drug screen (hosp performed)  Result Value Ref Range   Opiates POSITIVE (A) NONE DETECTED   Cocaine NONE DETECTED NONE DETECTED   Benzodiazepines NONE DETECTED NONE DETECTED   Amphetamines NONE DETECTED NONE DETECTED   Tetrahydrocannabinol NONE DETECTED NONE DETECTED   Barbiturates NONE DETECTED NONE DETECTED  Urinalysis, Routine w reflex microscopic  Result Value Ref Range   Color, Urine YELLOW YELLOW   APPearance CLOUDY (A) CLEAR   Specific Gravity, Urine 1.045 (H) 1.005 - 1.030   pH 5.5 5.0 - 8.0   Glucose, UA >1000 (A) NEGATIVE mg/dL   Hgb urine dipstick NEGATIVE NEGATIVE   Bilirubin Urine MODERATE (A) NEGATIVE   Ketones, ur >80 (A) NEGATIVE mg/dL   Protein, ur NEGATIVE NEGATIVE mg/dL   Urobilinogen, UA 0.2 0.0 - 1.0 mg/dL   Nitrite  NEGATIVE NEGATIVE   Leukocytes, UA NEGATIVE NEGATIVE  Pregnancy, urine  Result Value  Ref Range   Preg Test, Ur NEGATIVE NEGATIVE  Urine microscopic-add on  Result Value Ref Range   Squamous Epithelial / LPF RARE RARE   WBC, UA 0-2 <3 WBC/hpf   RBC / HPF 0-2 <3 RBC/hpf  CBG monitoring, ED  Result Value Ref Range   Glucose-Capillary 410 (H) 70 - 99 mg/dL  CBG monitoring, ED  Result Value Ref Range   Glucose-Capillary 132 (H) 70 - 99 mg/dL  CBG monitoring, ED  Result Value Ref Range   Glucose-Capillary 81 70 - 99 mg/dL   Results for orders placed or performed during the hospital encounter of 03/28/14  Rapid strep screen  Result Value Ref Range   Streptococcus, Group A Screen (Direct) NEGATIVE NEGATIVE  CBC  Result Value Ref Range   WBC 5.7 4.0 - 10.5 K/uL   RBC 4.27 3.87 - 5.11 MIL/uL   Hemoglobin 13.4 12.0 - 15.0 g/dL   HCT 38.4 36.0 - 46.0 %   MCV 89.9 78.0 - 100.0 fL   MCH 31.4 26.0 - 34.0 pg   MCHC 34.9 30.0 - 36.0 g/dL   RDW 13.3 11.5 - 15.5 %   Platelets 293 150 - 400 K/uL  Comprehensive metabolic panel  Result Value Ref Range   Sodium 131 (L) 135 - 145 mmol/L   Potassium 4.0 3.5 - 5.1 mmol/L   Chloride 90 (L) 96 - 112 mmol/L   CO2 23 19 - 32 mmol/L   Glucose, Bld 408 (H) 70 - 99 mg/dL   BUN 20 6 - 23 mg/dL   Creatinine, Ser 0.79 0.50 - 1.10 mg/dL   Calcium 9.9 8.4 - 10.5 mg/dL   Total Protein 7.6 6.0 - 8.3 g/dL   Albumin 4.7 3.5 - 5.2 g/dL   AST 32 0 - 37 U/L   ALT 29 0 - 35 U/L   Alkaline Phosphatase 69 39 - 117 U/L   Total Bilirubin 1.0 0.3 - 1.2 mg/dL   GFR calc non Af Amer >90 >90 mL/min   GFR calc Af Amer >90 >90 mL/min   Anion gap 18 (H) 5 - 15  Urine rapid drug screen (hosp performed)  Result Value Ref Range   Opiates POSITIVE (A) NONE DETECTED   Cocaine NONE DETECTED NONE DETECTED   Benzodiazepines NONE DETECTED NONE DETECTED   Amphetamines NONE DETECTED NONE DETECTED   Tetrahydrocannabinol NONE DETECTED NONE DETECTED   Barbiturates NONE DETECTED NONE DETECTED  Urinalysis, Routine w reflex microscopic  Result Value Ref Range    Color, Urine YELLOW YELLOW   APPearance CLOUDY (A) CLEAR   Specific Gravity, Urine 1.045 (H) 1.005 - 1.030   pH 5.5 5.0 - 8.0   Glucose, UA >1000 (A) NEGATIVE mg/dL   Hgb urine dipstick NEGATIVE NEGATIVE   Bilirubin Urine MODERATE (A) NEGATIVE   Ketones, ur >80 (A) NEGATIVE mg/dL   Protein, ur NEGATIVE NEGATIVE mg/dL   Urobilinogen, UA 0.2 0.0 - 1.0 mg/dL   Nitrite NEGATIVE NEGATIVE   Leukocytes, UA NEGATIVE NEGATIVE  Pregnancy, urine  Result Value Ref Range   Preg Test, Ur NEGATIVE NEGATIVE  Urine microscopic-add on  Result Value Ref Range   Squamous Epithelial / LPF RARE RARE   WBC, UA 0-2 <3 WBC/hpf   RBC / HPF 0-2 <3 RBC/hpf  CBG monitoring, ED  Result Value Ref Range   Glucose-Capillary 410 (H) 70 - 99 mg/dL  CBG monitoring, ED  Result  Value Ref Range   Glucose-Capillary 132 (H) 70 - 99 mg/dL  CBG monitoring, ED  Result Value Ref Range   Glucose-Capillary 81 70 - 99 mg/dL     Imaging Review No results found.   EKG Interpretation None      MDM   Final diagnoses:  Hyperglycemia    Patient presented today with concerns for DKA. Finger stick by EMS was or 37. Patient has had frequent admissions for DKA. Patient recently discharged 3 weeks ago. Patient also diagnosed by her primary is probably having gastroparesis. Patient's being treated with Reglan for that and Protonix. Workup here patient given 10 units of regular insulin blood sugar improved markedly only down to 132. No evidence of acidosis. Patient given hydration antinausea medicine pain medicine and feeling much better. In addition patient has had some mild upper respiratory infection symptoms along with sore throat rapid strep was negative. Patient can be discharged home with close follow-up.    Fredia Sorrow, MD 03/28/14 [phone]

## 2014-03-28 NOTE — ED Notes (Addendum)
Pt coming in for high blood sugar. EMS 437 finger stick. Pt c/o of not feeling well with n/v. Recently d/c'd from hospital for DKA 3 weeks ago.

## 2014-03-28 NOTE — Discharge Instructions (Signed)
Continue your current medications for the gastroparesis. And continue to follow your blood sugars carefully. Make an appointment to follow-up with your doctor. Today no evidence of DKA. Rapid strep test was negative.

## 2014-03-28 NOTE — ED Notes (Signed)
Bed: WA08 Expected date:  Expected time:  Means of arrival:  Comments: EMS- hyperglycemia, CBG 437, recent DKA

## 2014-03-30 ENCOUNTER — Encounter (HOSPITAL_COMMUNITY): Payer: Self-pay | Admitting: Oncology

## 2014-03-30 ENCOUNTER — Inpatient Hospital Stay (HOSPITAL_COMMUNITY)
Admission: EM | Admit: 2014-03-30 | Discharge: 2014-04-01 | DRG: 639 | Disposition: A | Discharge status: Home / Self Care | Payer: Medicaid Other | Attending: Family Medicine | Admitting: Family Medicine

## 2014-03-30 DIAGNOSIS — Z806 Family history of leukemia: Secondary | ICD-10-CM

## 2014-03-30 DIAGNOSIS — Z833 Family history of diabetes mellitus: Secondary | ICD-10-CM

## 2014-03-30 DIAGNOSIS — R112 Nausea with vomiting, unspecified: Secondary | ICD-10-CM | POA: Diagnosis present

## 2014-03-30 DIAGNOSIS — N189 Chronic kidney disease, unspecified: Secondary | ICD-10-CM | POA: Diagnosis present

## 2014-03-30 DIAGNOSIS — Z794 Long term (current) use of insulin: Secondary | ICD-10-CM

## 2014-03-30 DIAGNOSIS — E1010 Type 1 diabetes mellitus with ketoacidosis without coma: Secondary | ICD-10-CM | POA: Diagnosis present

## 2014-03-30 DIAGNOSIS — K3184 Gastroparesis: Secondary | ICD-10-CM | POA: Diagnosis present

## 2014-03-30 DIAGNOSIS — E876 Hypokalemia: Secondary | ICD-10-CM | POA: Diagnosis present

## 2014-03-30 DIAGNOSIS — F410 Panic disorder [episodic paroxysmal anxiety] without agoraphobia: Secondary | ICD-10-CM | POA: Diagnosis present

## 2014-03-30 DIAGNOSIS — E1043 Type 1 diabetes mellitus with diabetic autonomic (poly)neuropathy: Secondary | ICD-10-CM | POA: Diagnosis present

## 2014-03-30 DIAGNOSIS — Z87891 Personal history of nicotine dependence: Secondary | ICD-10-CM | POA: Diagnosis not present

## 2014-03-30 DIAGNOSIS — K219 Gastro-esophageal reflux disease without esophagitis: Secondary | ICD-10-CM | POA: Diagnosis present

## 2014-03-30 DIAGNOSIS — Z801 Family history of malignant neoplasm of trachea, bronchus and lung: Secondary | ICD-10-CM | POA: Diagnosis not present

## 2014-03-30 DIAGNOSIS — E0810 Diabetes mellitus due to underlying condition with ketoacidosis without coma: Secondary | ICD-10-CM

## 2014-03-30 LAB — BLOOD GAS, VENOUS
ACID-BASE DEFICIT: 27.3 mmol/L — AB (ref 0.0–2.0)
Bicarbonate: 4.1 mEq/L — ABNORMAL LOW (ref 20.0–24.0)
FIO2: 0.21 %
O2 Saturation: 69.7 %
PH VEN: 7.005 — AB (ref 7.250–7.300)
Patient temperature: 97.5
TCO2: 4.1 mmol/L (ref 0–100)
pCO2, Ven: 17.1 mmHg — ABNORMAL LOW (ref 45.0–50.0)
pO2, Ven: 49.4 mmHg — ABNORMAL HIGH (ref 30.0–45.0)

## 2014-03-30 LAB — MRSA PCR SCREENING: MRSA BY PCR: NEGATIVE

## 2014-03-30 LAB — CBC WITH DIFFERENTIAL/PLATELET
Basophils Absolute: 0 10*3/uL (ref 0.0–0.1)
Basophils Relative: 0 % (ref 0–1)
Eosinophils Absolute: 0 10*3/uL (ref 0.0–0.7)
Eosinophils Relative: 0 % (ref 0–5)
HCT: 44.1 % (ref 36.0–46.0)
HEMOGLOBIN: 14.8 g/dL (ref 12.0–15.0)
Lymphocytes Relative: 25 % (ref 12–46)
Lymphs Abs: 4.3 10*3/uL — ABNORMAL HIGH (ref 0.7–4.0)
MCH: 32 pg (ref 26.0–34.0)
MCHC: 33.6 g/dL (ref 30.0–36.0)
MCV: 95.2 fL (ref 78.0–100.0)
MONO ABS: 1.2 10*3/uL — AB (ref 0.1–1.0)
Monocytes Relative: 7 % (ref 3–12)
NEUTROS ABS: 11.7 10*3/uL — AB (ref 1.7–7.7)
NEUTROS PCT: 68 % (ref 43–77)
Platelets: 421 10*3/uL — ABNORMAL HIGH (ref 150–400)
RBC: 4.63 MIL/uL (ref 3.87–5.11)
RDW: 13.3 % (ref 11.5–15.5)
WBC: 17.2 10*3/uL — AB (ref 4.0–10.5)

## 2014-03-30 LAB — BASIC METABOLIC PANEL
Anion gap: 19 — ABNORMAL HIGH (ref 5–15)
Anion gap: 11 (ref 5–15)
Anion gap: 6 (ref 5–15)
Anion gap: 7 (ref 5–15)
BUN: 16 mg/dL (ref 6–23)
BUN: 13 mg/dL (ref 6–23)
BUN: 13 mg/dL (ref 6–23)
BUN: 13 mg/dL (ref 6–23)
CHLORIDE: 114 mmol/L — AB (ref 96–112)
CO2: 7 mmol/L — CL (ref 19–32)
CO2: 9 mmol/L — AB (ref 19–32)
CO2: 14 mmol/L — AB (ref 19–32)
CO2: 15 mmol/L — ABNORMAL LOW (ref 19–32)
Calcium: 8.7 mg/dL (ref 8.4–10.5)
Calcium: 8.6 mg/dL (ref 8.4–10.5)
Calcium: 8.4 mg/dL (ref 8.4–10.5)
Calcium: 8.3 mg/dL — ABNORMAL LOW (ref 8.4–10.5)
Chloride: 108 mmol/L (ref 96–112)
Chloride: 113 mmol/L — ABNORMAL HIGH (ref 96–112)
Chloride: 111 mmol/L (ref 96–112)
Creatinine, Ser: 1.08 mg/dL (ref 0.50–1.10)
Creatinine, Ser: 0.73 mg/dL (ref 0.50–1.10)
Creatinine, Ser: 0.69 mg/dL (ref 0.50–1.10)
Creatinine, Ser: 0.63 mg/dL (ref 0.50–1.10)
GFR calc Af Amer: >90 mL/min (ref >90)
GFR calc Af Amer: >90 mL/min (ref >90)
GFR calc non Af Amer: >90 mL/min (ref >90)
GFR, EST AFRICAN AMERICAN: 77 mL/min — AB (ref >90)
GFR, EST NON AFRICAN AMERICAN: 66 mL/min — AB (ref >90)
GLUCOSE: 160 mg/dL — AB (ref 70–99)
Glucose, Bld: 288 mg/dL — ABNORMAL HIGH (ref 70–99)
Glucose, Bld: 146 mg/dL — ABNORMAL HIGH (ref 70–99)
Glucose, Bld: 110 mg/dL — ABNORMAL HIGH (ref 70–99)
Potassium: 4.4 mmol/L (ref 3.5–5.1)
Potassium: 4.2 mmol/L (ref 3.5–5.1)
Potassium: 3.7 mmol/L (ref 3.5–5.1)
Potassium: 3.6 mmol/L (ref 3.5–5.1)
Sodium: 134 mmol/L — ABNORMAL LOW (ref 135–145)
Sodium: 134 mmol/L — ABNORMAL LOW (ref 135–145)
Sodium: 133 mmol/L — ABNORMAL LOW (ref 135–145)
Sodium: 133 mmol/L — ABNORMAL LOW (ref 135–145)

## 2014-03-30 LAB — I-STAT CHEM 8, ED
BUN: 18 mg/dL (ref 6–23)
CREATININE: 0.8 mg/dL (ref 0.50–1.10)
Calcium, Ion: 1.24 mmol/L — ABNORMAL HIGH (ref 1.12–1.23)
Chloride: 99 mmol/L (ref 96–112)
Glucose, Bld: 618 mg/dL (ref 70–99)
HEMATOCRIT: 52 % — AB (ref 36.0–46.0)
Hemoglobin: 17.7 g/dL — ABNORMAL HIGH (ref 12.0–15.0)
POTASSIUM: 5 mmol/L (ref 3.5–5.1)
Sodium: 130 mmol/L — ABNORMAL LOW (ref 135–145)
TCO2: 6 mmol/L (ref 0–100)

## 2014-03-30 LAB — URINALYSIS, ROUTINE W REFLEX MICROSCOPIC
Bilirubin Urine: NEGATIVE
Glucose, UA: >1000 mg/dL — AB
Hgb urine dipstick: NEGATIVE
Leukocytes, UA: NEGATIVE
Nitrite: NEGATIVE
PH: 5 (ref 5.0–8.0)
PROTEIN: 30 mg/dL — AB
Specific Gravity, Urine: 1.023 (ref 1.005–1.030)
Urobilinogen, UA: 0.2 mg/dL (ref 0.0–1.0)

## 2014-03-30 LAB — GLUCOSE, CAPILLARY
Glucose-Capillary: 365 mg/dL — ABNORMAL HIGH (ref 70–99)
Glucose-Capillary: 211 mg/dL — ABNORMAL HIGH (ref 70–99)

## 2014-03-30 LAB — CBG MONITORING, ED
GLUCOSE-CAPILLARY: 516 mg/dL — AB (ref 70–99)
GLUCOSE-CAPILLARY: 559 mg/dL — AB (ref 70–99)
Glucose-Capillary: 458 mg/dL — ABNORMAL HIGH (ref 70–99)

## 2014-03-30 LAB — CBC
HEMATOCRIT: 38.2 % (ref 36.0–46.0)
Hemoglobin: 12.5 g/dL (ref 12.0–15.0)
MCH: 31.4 pg (ref 26.0–34.0)
MCHC: 32.7 g/dL (ref 30.0–36.0)
MCV: 96 fL (ref 78.0–100.0)
PLATELETS: 296 10*3/uL (ref 150–400)
RBC: 3.98 MIL/uL (ref 3.87–5.11)
RDW: 13.3 % (ref 11.5–15.5)
WBC: 18.4 10*3/uL — ABNORMAL HIGH (ref 4.0–10.5)

## 2014-03-30 LAB — PREGNANCY, URINE: PREG TEST UR: NEGATIVE

## 2014-03-30 LAB — URINE MICROSCOPIC-ADD ON

## 2014-03-30 LAB — RAPID URINE DRUG SCREEN, HOSP PERFORMED
Amphetamines: NOT DETECTED
Barbiturates: NOT DETECTED
Benzodiazepines: NOT DETECTED
Cocaine: NOT DETECTED
Opiates: NOT DETECTED
TETRAHYDROCANNABINOL: NOT DETECTED

## 2014-03-30 LAB — ETHANOL: Alcohol, Ethyl (B): <5 mg/dL (ref 0–9)

## 2014-03-30 MED ORDER — INSULIN REGULAR HUMAN 100 UNIT/ML IJ SOLN
INTRAMUSCULAR | Status: DC
  Administered 2014-03-30: 3.1 [IU]/h via INTRAVENOUS
  Administered 2014-03-31: 1.7 [IU]/h via INTRAVENOUS
  Filled 2014-03-30: qty 2.5

## 2014-03-30 MED ORDER — AMITRIPTYLINE HCL 25 MG PO TABS
50.0000 mg | ORAL_TABLET | Freq: Every day | ORAL | Status: DC
  Administered 2014-03-30 – 2014-03-31 (×2): 50 mg via ORAL
  Filled 2014-03-30 (×2): qty 2

## 2014-03-30 MED ORDER — SODIUM CHLORIDE 0.9 % IV SOLN
INTRAVENOUS | Status: DC
  Administered 2014-03-30: 10:00:00 via INTRAVENOUS

## 2014-03-30 MED ORDER — DEXTROSE-NACL 5-0.45 % IV SOLN
INTRAVENOUS | Status: DC
  Administered 2014-03-30 – 2014-03-31 (×4): via INTRAVENOUS

## 2014-03-30 MED ORDER — ONDANSETRON HCL 4 MG/2ML IJ SOLN
4.0000 mg | Freq: Once | INTRAMUSCULAR | Status: AC
  Administered 2014-03-30: 4 mg via INTRAVENOUS
  Filled 2014-03-30: qty 2

## 2014-03-30 MED ORDER — GABAPENTIN 100 MG PO CAPS
200.0000 mg | ORAL_CAPSULE | Freq: Every day | ORAL | Status: DC
  Administered 2014-03-30 – 2014-03-31 (×2): 200 mg via ORAL
  Filled 2014-03-30 (×2): qty 2

## 2014-03-30 MED ORDER — SODIUM CHLORIDE 0.9 % IV SOLN: Freq: Once | INTRAVENOUS | Status: DC

## 2014-03-30 MED ORDER — PANTOPRAZOLE SODIUM 40 MG PO TBEC
40.0000 mg | DELAYED_RELEASE_TABLET | Freq: Every day | ORAL | Status: DC
  Administered 2014-03-30 – 2014-04-01 (×3): 40 mg via ORAL
  Filled 2014-03-30 (×3): qty 1

## 2014-03-30 MED ORDER — OXYCODONE HCL 5 MG PO TABS
5.0000 mg | ORAL_TABLET | Freq: Four times a day (QID) | ORAL | Status: DC | PRN
  Administered 2014-03-30 – 2014-04-01 (×3): 5 mg via ORAL
  Filled 2014-03-30 (×3): qty 1

## 2014-03-30 MED ORDER — ONDANSETRON HCL 4 MG PO TABS: 4.0000 mg | ORAL_TABLET | Freq: Four times a day (QID) | ORAL | Status: DC | PRN

## 2014-03-30 MED ORDER — HYDROMORPHONE HCL 1 MG/ML IJ SOLN
0.5000 mg | INTRAMUSCULAR | Status: DC | PRN
Start: 2014-03-30 — End: 2014-04-01
  Administered 2014-03-31 – 2014-04-01 (×5): 0.5 mg via INTRAVENOUS
  Filled 2014-03-30 (×5): qty 1

## 2014-03-30 MED ORDER — HEPARIN SODIUM (PORCINE) 5000 UNIT/ML IJ SOLN
5000.0000 [IU] | Freq: Three times a day (TID) | INTRAMUSCULAR | Status: DC
  Administered 2014-03-30 – 2014-04-01 (×6): 5000 [IU] via SUBCUTANEOUS
  Filled 2014-03-30 (×6): qty 1

## 2014-03-30 MED ORDER — METOCLOPRAMIDE HCL 10 MG PO TABS
10.0000 mg | ORAL_TABLET | Freq: Three times a day (TID) | ORAL | Status: DC
Start: 2014-03-30 — End: 2014-04-01
  Administered 2014-03-30 – 2014-04-01 (×9): 10 mg via ORAL
  Filled 2014-03-30 (×9): qty 1

## 2014-03-30 MED ORDER — SODIUM CHLORIDE 0.9 % IV BOLUS (SEPSIS)
2000.0000 mL | Freq: Once | INTRAVENOUS | Status: AC
  Administered 2014-03-30: 2000 mL via INTRAVENOUS

## 2014-03-30 MED ORDER — OXYCODONE-ACETAMINOPHEN 10-325 MG PO TABS
1.0000 | ORAL_TABLET | Freq: Four times a day (QID) | ORAL | Status: DC | PRN
Start: 2014-03-30 — End: 2014-03-30

## 2014-03-30 MED ORDER — POTASSIUM CHLORIDE 10 MEQ/100ML IV SOLN: 10.0000 meq | INTRAVENOUS | Status: DC

## 2014-03-30 MED ORDER — HYDROMORPHONE HCL 1 MG/ML IJ SOLN
1.0000 mg | Freq: Once | INTRAMUSCULAR | Status: AC
  Administered 2014-03-30: 1 mg via INTRAVENOUS
  Filled 2014-03-30: qty 1

## 2014-03-30 MED ORDER — ONDANSETRON HCL 4 MG/2ML IJ SOLN
4.0000 mg | Freq: Four times a day (QID) | INTRAMUSCULAR | Status: DC | PRN
  Administered 2014-03-30: 4 mg via INTRAVENOUS
  Filled 2014-03-30: qty 2

## 2014-03-30 MED ORDER — OXYCODONE-ACETAMINOPHEN 5-325 MG PO TABS
1.0000 | ORAL_TABLET | Freq: Four times a day (QID) | ORAL | Status: DC | PRN
  Administered 2014-03-30 – 2014-04-01 (×4): 1 via ORAL
  Filled 2014-03-30 (×4): qty 1

## 2014-03-30 MED ORDER — ONDANSETRON HCL 4 MG/2ML IJ SOLN: 4.0000 mg | Freq: Once | INTRAMUSCULAR | Status: DC

## 2014-03-30 MED ORDER — SODIUM CHLORIDE 0.9 % IV SOLN
INTRAVENOUS | Status: DC
  Administered 2014-03-30: 5 [IU]/h via INTRAVENOUS
  Administered 2014-03-30: 4 [IU]/h via INTRAVENOUS
  Filled 2014-03-30: qty 2.5

## 2014-03-30 MED ORDER — SODIUM CHLORIDE 0.9 % IJ SOLN
3.0000 mL | Freq: Two times a day (BID) | INTRAMUSCULAR | Status: DC
  Administered 2014-03-31: 3 mL via INTRAVENOUS

## 2014-03-30 MED ORDER — SODIUM CHLORIDE 0.9 % IV SOLN
INTRAVENOUS | Status: AC
  Administered 2014-03-30: 10:00:00 via INTRAVENOUS

## 2014-03-30 MED ORDER — HYDROMORPHONE HCL 1 MG/ML IJ SOLN
0.5000 mg | Freq: Once | INTRAMUSCULAR | Status: AC
  Administered 2014-03-30: 0.5 mg via INTRAVENOUS
  Filled 2014-03-30: qty 1

## 2014-03-30 NOTE — Progress Notes (Signed)
[facility] informed of [facility] results no new orders at this time.  Will continue to monitor and report.

## 2014-03-30 NOTE — ED Provider Notes (Signed)
CSN: [id number]     Arrival date & time 03/30/14  0549 History   First MD Initiated Contact with Patient 03/30/14 [phone]     Chief Complaint  Patient presents with  . Hyperglycemia     (Consider location/radiation/quality/duration/timing/severity/associated sxs/prior Treatment) HPI  This is a 34 year old female with brittle insulin-dependent diabetes with a past history of multiple episodes of diabetic ketoacidosis. She was seen 2 days ago for hyperglycemia but was not in DKA at that time and was discharged home. She returns stating she felt bad throughout the day yesterday but became acutely worse about 10 PM. She is complaining of shortness of breath, abdominal pain which she states is severe, nausea, vomiting, some diarrhea, urinary frequency and dry mouth. She checked her sugar and found to be high at home and she gave herself 2 units of insulin without improvement in her hyperglycemia. On arrival her sugar was noted to be 516. She was given 4 milligrams of IM Zofran by EMS prior to arrival without relief of her nausea.  Past Medical History  Diagnosis Date  . Diabetes mellitus   . Thyroid disease     hypothyroidism associated with pregnancy  . Back pain   . Panic attack   . PONV (postoperative nausea and vomiting)   . Anxiety   . GERD (gastroesophageal reflux disease)   . DM gastroparesis   . Chronic kidney disease   . Anemia    Past Surgical History  Procedure Laterality Date  . Laparoscopy      age 17  . Back surgery      age 60  . Tooth extraction    . Root canal  10/10/12   Family History  Problem Relation Age of Onset  . Diabetes Maternal Grandmother   . Cancer Maternal Grandfather     Small cell lung cancer  . Cancer Maternal Grandmother     Leukemia   History  Substance Use Topics  . Smoking status: Former Smoker    Quit date: 04/19/2000  . Smokeless tobacco: Former Systems developer  . Alcohol Use: Yes     Comment: RARE SIPS OF WINE   OB History    No data available      Review of Systems  All other systems reviewed and are negative.   Allergies  Scallops; Morphine and related; and Latex  Home Medications   Prior to Admission medications   Medication Sig Start Date End Date Taking? Authorizing Provider  alum & mag hydroxide-simeth (MAALOX/MYLANTA) 200-200-20 MG/5ML suspension Take 30 mLs by mouth every 6 (six) hours as needed for indigestion or heartburn. Patient not taking: Reported on 03/25/2014 11/05/13   Nishant Dhungel, MD  amitriptyline (ELAVIL) 50 MG tablet Take 50 mg by mouth at bedtime.    Historical Provider, MD  amoxicillin (AMOXIL) 500 MG capsule Take 1 capsule (500 mg total) by mouth 3 (three) times daily. Patient not taking: Reported on 03/25/2014 03/06/14   Venetia Maxon Rama, MD  clotrimazole-betamethasone (LOTRISONE) cream Apply 1 application topically daily as needed (rash).    Historical Provider, MD  Cyanocobalamin (VITAMIN B-12 PO) Take 2 tablets by mouth 2 (two) times daily.    Historical Provider, MD  famotidine (PEPCID) 20 MG tablet Take 1 tablet (20 mg total) by mouth 2 (two) times daily. Patient not taking: Reported on 03/28/2014 11/05/13   Nishant Dhungel, MD  gabapentin (NEURONTIN) 100 MG capsule Take 200 mg by mouth at bedtime.    Historical Provider, MD  glucagon (GLUCAGON EMERGENCY) 1 MG  injection Inject 1 mg into the muscle once as needed (severe hypoglycemia). 02/19/14   Philemon Kingdom, MD  HYDROcodone-acetaminophen (NORCO/VICODIN) 5-325 MG per tablet Take 1 tablet by mouth every 6 (six) hours as needed for moderate pain. Patient not taking: Reported on 03/25/2014 02/04/14   Reyne Dumas, MD  insulin aspart (NOVOLOG) 100 UNIT/ML injection Inject 1-3 Units into the skin 3 (three) times daily with meals. CBG < 70: implement hypoglycemia protocol CBG > 400: call MD   CBG 70 - 120: 0 units CBG 121 - 150: 3 units CBG 151 - 200: 4 units CBG 201 - 250: 7 units CBG 251 - 300: 11 units CBG 301 - 350: 15 units CBG 351 - 400: 20  units CBG > 400: call MD 02/01/13   Donita Brooks, NP  insulin glargine (LANTUS) 100 UNIT/ML injection Inject 0.15 mLs (15 Units total) into the skin every morning. 02/19/14   Philemon Kingdom, MD  Melatonin 3 MG TABS Take 1-2 tablets by mouth at bedtime as needed (sleep).    Historical Provider, MD  metoCLOPramide (REGLAN) 10 MG tablet Take 1 tablet (10 mg total) by mouth 4 (four) times daily -  before meals and at bedtime. 02/04/14   Reyne Dumas, MD  Multiple Vitamin (MULTIVITAMIN WITH MINERALS) TABS Take 1 tablet by mouth every morning. Diabetic support vitamin    Historical Provider, MD  nystatin-triamcinolone (MYCOLOG II) cream Apply 1 application topically 2 (two) times daily as needed (rash).     Historical Provider, MD  omeprazole (PRILOSEC) 40 MG capsule Take 40 mg by mouth daily. 01/17/14   Historical Provider, MD  ondansetron (ZOFRAN-ODT) 8 MG disintegrating tablet Take 8 mg by mouth every 8 (eight) hours as needed for nausea or vomiting (nausea & vomiting).     Historical Provider, MD  oxyCODONE-acetaminophen (PERCOCET) 10-325 MG per tablet Take 1 tablet by mouth every 6 (six) hours as needed for pain.    Historical Provider, MD  pantoprazole (PROTONIX) 40 MG tablet Take 1 tablet (40 mg total) by mouth daily. 02/04/14   Reyne Dumas, MD   BP 129/87 mmHg  Pulse 127  Temp(Src) 97.5 F (36.4 C) (Oral)  Resp 27  SpO2 96%   Physical Exam  General: Well-developed, well-nourished female in no acute distress; appearance consistent with age of record HENT: normocephalic; atraumatic; mucous membranes dry; breath smells strongly of acetone Eyes: pupils equal, round and reactive to light; extraocular muscles intact Neck: supple Heart: regular rate and rhythm; tachycardic Lungs: clear to auscultation bilaterally Abdomen: soft; nondistended; diffusely tender; bowel sounds present Extremities: No deformity; full range of motion; pulses normal Neurologic: Awake, alert and oriented; motor  function intact in all extremities and symmetric; no facial droop Skin: Warm and dry Psychiatric: Anxious; tearful    ED Course  Procedures (including critical care time)  CRITICAL CARE Performed by: Nieves Chapa L Total critical care time: 35 minutes Critical care time was exclusive of separately billable procedures and treating other patients. Critical care was necessary to treat or prevent imminent or life-threatening deterioration. Critical care was time spent personally by me on the following activities: development of treatment plan with patient and/or surrogate as well as nursing, discussions with consultants, evaluation of patient's response to treatment, examination of patient, obtaining history from patient or surrogate, ordering and performing treatments and interventions, ordering and review of laboratory studies, ordering and review of radiographic studies, pulse oximetry and re-evaluation of patient's condition.   MDM   Nursing notes and vitals signs, including  pulse oximetry, reviewed.  Summary of this visit's results, reviewed by myself:  Labs:  Results for orders placed or performed during the hospital encounter of 03/30/14 (from the past 24 hour(s))  CBG monitoring, ED     Status: Abnormal   Collection Time: 03/30/14  5:58 AM  Result Value Ref Range   Glucose-Capillary 516 (H) 70 - 99 mg/dL   Comment 1 Notify RN   Blood gas, venous     Status: Abnormal   Collection Time: 03/30/14  6:18 AM  Result Value Ref Range   FIO2 0.21 %   pH, Ven 7.005 (LL) 7.250 - 7.300   pCO2, Ven 17.1 (L) 45.0 - 50.0 mmHg   pO2, Ven 49.4 (H) 30.0 - 45.0 mmHg   Bicarbonate 4.1 (L) 20.0 - 24.0 mEq/L   TCO2 4.1 0 - 100 mmol/L   Acid-base deficit 27.3 (H) 0.0 - 2.0 mmol/L   O2 Saturation 69.7 %   Patient temperature 97.5    Collection site VEIN    Drawn by COLLECTED BY LABORATORY    Sample type VENOUS   I-stat chem 8, ed     Status: Abnormal   Collection Time: 03/30/14  6:21 AM   Result Value Ref Range   Sodium 130 (L) 135 - 145 mmol/L   Potassium 5.0 3.5 - 5.1 mmol/L   Chloride 99 96 - 112 mmol/L   BUN 18 6 - 23 mg/dL   Creatinine, Ser 0.80 0.50 - 1.10 mg/dL   Glucose, Bld 618 (HH) 70 - 99 mg/dL   Calcium, Ion 1.24 (H) 1.12 - 1.23 mmol/L   TCO2 6 0 - 100 mmol/L   Hemoglobin 17.7 (H) 12.0 - 15.0 g/dL   HCT 52.0 (H) 36.0 - 46.0 %   Comment NOTIFIED PHYSICIAN   CBC with Differential/Platelet     Status: Abnormal (Preliminary result)   Collection Time: 03/30/14  6:24 AM  Result Value Ref Range   WBC 17.2 (H) 4.0 - 10.5 K/uL   RBC 4.63 3.87 - 5.11 MIL/uL   Hemoglobin 14.8 12.0 - 15.0 g/dL   HCT 44.1 36.0 - 46.0 %   MCV 95.2 78.0 - 100.0 fL   MCH 32.0 26.0 - 34.0 pg   MCHC 33.6 30.0 - 36.0 g/dL   RDW 13.3 11.5 - 15.5 %   Platelets 421 (H) 150 - 400 K/uL   Neutrophils Relative % PENDING 43 - 77 %   Neutro Abs PENDING 1.7 - 7.7 K/uL   Band Neutrophils PENDING 0 - 10 %   Lymphocytes Relative PENDING 12 - 46 %   Lymphs Abs PENDING 0.7 - 4.0 K/uL   Monocytes Relative PENDING 3 - 12 %   Monocytes Absolute PENDING 0.1 - 1.0 K/uL   Eosinophils Relative PENDING 0 - 5 %   Eosinophils Absolute PENDING 0.0 - 0.7 K/uL   Basophils Relative PENDING 0 - 1 %   Basophils Absolute PENDING 0.0 - 0.1 K/uL   WBC Morphology PENDING    RBC Morphology PENDING    Smear Review PENDING    nRBC PENDING 0 /100 WBC   Metamyelocytes Relative PENDING %   Myelocytes PENDING %   Promyelocytes Absolute PENDING %   Blasts PENDING %  CBG monitoring, ED     Status: Abnormal   Collection Time: 03/30/14  6:47 AM  Result Value Ref Range   Glucose-Capillary 559 (HH) 70 - 99 mg/dL   Comment 1 Notify RN    6:32 AM 2 liter normal saline bolus  initiated. Patient placed on insulin drip per Glucose Stabilizer. Patient requesting sodium bicarbonate "to help her urinate"; bicarbonate not given after discussion with hospitalist.     Wynetta Fines, MD 03/30/14 [phone]

## 2014-03-30 NOTE — ED Notes (Signed)
Pt. On cardiac monitor. 

## 2014-03-30 NOTE — H&P (Signed)
[facility] History and Physical  Beason COHICK XNT:[id number] DOB: 01-Jul-1980 DOA: 03/30/2014  Referring physician: Dr. Florina Ou PCP: [name], MD   Chief Complaint: nausea, abdominal discomfort  HPI: [name] is a 34 y.o. female  With history of type 1 diabetes mellitus reportedly brittle with multiple prior admissions for DKA. His presenting again today stating that she is been having some severe nausea and vomiting. As a result decided to get checked up and was found to have elevated blood sugars. She's also complaining of abdominal discomfort secondary to nausea and vomiting. She denies any fevers or chills or any sources of infections.  We were consulted for medical admission secondary to DKA.   Review of Systems:  Constitutional:  No weight loss, night sweats, Fevers, chills, fatigue.  HEENT:  No headaches, Difficulty swallowing,Tooth/dental problems,Sore throat,  No sneezing, itching, ear ache, nasal congestion, post nasal drip,  Cardio-vascular:  No chest pain, Orthopnea, PND, swelling in lower extremities, anasarca, dizziness, palpitations  GI:  No heartburn, indigestion, + abdominal pain ( reports from nausea), + nausea, vomiting, diarrhea, change in bowel habits, + loss of appetite  Resp:  No shortness of breath with exertion or at rest. No excess mucus, no productive cough, No non-productive cough, No coughing up of blood.No change in color of mucus.No wheezing.No chest wall deformity  Skin:  no rash or lesions.  GU:  no dysuria, change in color of urine, + urgency or frequency. No flank pain.  Musculoskeletal:  No joint pain or swelling. No decreased range of motion. No back pain.  Psych:  No change in mood or affect. No depression or anxiety. No memory loss.   Past Medical History  Diagnosis Date  . Diabetes mellitus   . Thyroid disease     hypothyroidism associated with pregnancy  . Back pain   . Panic attack   . PONV (postoperative nausea  and vomiting)   . Anxiety   . GERD (gastroesophageal reflux disease)   . DM gastroparesis   . Chronic kidney disease   . Anemia    Past Surgical History  Procedure Laterality Date  . Laparoscopy      age 57  . Back surgery      age 18  . Tooth extraction    . Root canal  10/10/12   Social History:  reports that she quit smoking about 13 years ago. She has quit using smokeless tobacco. She reports that she drinks alcohol. She reports that she does not use illicit drugs.  Allergies  Allergen Reactions  . Scallops [Shellfish Allergy] Anaphylaxis  . Morphine And Related Itching  . Latex Rash    Family History  Problem Relation Age of Onset  . Diabetes Maternal Grandmother   . Cancer Maternal Grandfather     Small cell lung cancer  . Cancer Maternal Grandmother     Leukemia    Prior to Admission medications   Medication Sig Start Date End Date Taking? Authorizing Provider  alum & mag hydroxide-simeth (MAALOX/MYLANTA) 200-200-20 MG/5ML suspension Take 30 mLs by mouth every 6 (six) hours as needed for indigestion or heartburn. Patient not taking: Reported on 03/25/2014 11/05/13   Nishant Dhungel, MD  amitriptyline (ELAVIL) 50 MG tablet Take 50 mg by mouth at bedtime.    Historical Provider, MD  amoxicillin (AMOXIL) 500 MG capsule Take 1 capsule (500 mg total) by mouth 3 (three) times daily. Patient not taking: Reported on 03/25/2014 03/06/14   Venetia Maxon Rama, MD  clotrimazole-betamethasone Donalynn Furlong)  cream Apply 1 application topically daily as needed (rash).    Historical Provider, MD  Cyanocobalamin (VITAMIN B-12 PO) Take 2 tablets by mouth 2 (two) times daily.    Historical Provider, MD  famotidine (PEPCID) 20 MG tablet Take 1 tablet (20 mg total) by mouth 2 (two) times daily. Patient not taking: Reported on 03/28/2014 11/05/13   Nishant Dhungel, MD  gabapentin (NEURONTIN) 100 MG capsule Take 200 mg by mouth at bedtime.    Historical Provider, MD  glucagon (GLUCAGON EMERGENCY) 1 MG  injection Inject 1 mg into the muscle once as needed (severe hypoglycemia). 02/19/14   Philemon Kingdom, MD  HYDROcodone-acetaminophen (NORCO/VICODIN) 5-325 MG per tablet Take 1 tablet by mouth every 6 (six) hours as needed for moderate pain. Patient not taking: Reported on 03/25/2014 02/04/14   Reyne Dumas, MD  insulin aspart (NOVOLOG) 100 UNIT/ML injection Inject 1-3 Units into the skin 3 (three) times daily with meals. CBG < 70: implement hypoglycemia protocol CBG > 400: call MD   CBG 70 - 120: 0 units CBG 121 - 150: 3 units CBG 151 - 200: 4 units CBG 201 - 250: 7 units CBG 251 - 300: 11 units CBG 301 - 350: 15 units CBG 351 - 400: 20 units CBG > 400: call MD 02/01/13   Donita Brooks, NP  insulin glargine (LANTUS) 100 UNIT/ML injection Inject 0.15 mLs (15 Units total) into the skin every morning. 02/19/14   Philemon Kingdom, MD  Melatonin 3 MG TABS Take 1-2 tablets by mouth at bedtime as needed (sleep).    Historical Provider, MD  metoCLOPramide (REGLAN) 10 MG tablet Take 1 tablet (10 mg total) by mouth 4 (four) times daily -  before meals and at bedtime. 02/04/14   Reyne Dumas, MD  Multiple Vitamin (MULTIVITAMIN WITH MINERALS) TABS Take 1 tablet by mouth every morning. Diabetic support vitamin    Historical Provider, MD  nystatin-triamcinolone (MYCOLOG II) cream Apply 1 application topically 2 (two) times daily as needed (rash).     Historical Provider, MD  omeprazole (PRILOSEC) 40 MG capsule Take 40 mg by mouth daily. 01/17/14   Historical Provider, MD  ondansetron (ZOFRAN-ODT) 8 MG disintegrating tablet Take 8 mg by mouth every 8 (eight) hours as needed for nausea or vomiting (nausea & vomiting).     Historical Provider, MD  oxyCODONE-acetaminophen (PERCOCET) 10-325 MG per tablet Take 1 tablet by mouth every 6 (six) hours as needed for pain.    Historical Provider, MD  pantoprazole (PROTONIX) 40 MG tablet Take 1 tablet (40 mg total) by mouth daily. 02/04/14   Reyne Dumas, MD    Physical Exam: Filed Vitals:   03/30/14 0645 03/30/14 0700 03/30/14 0715 03/30/14 0823  BP: 136/77 127/77 129/87 151/98  Pulse: 126 124 127 140  Temp:    97.6 F (36.4 C)  TempSrc:    Oral  Resp: 24 24 27 24   Height:    5\' 5"  (1.651 m)  Weight:    46.2 kg (101 lb 13.6 oz)  SpO2: 100% 100% 96% 100%    Wt Readings from Last 3 Encounters:  03/30/14 46.2 kg (101 lb 13.6 oz)  03/25/14 48.081 kg (106 lb)  03/05/14 55.6 kg (122 lb 9.2 oz)    General:  Appears calm and comfortable Eyes: PERRL, normal lids, irises & conjunctiva ENT: grossly normal hearing, lips & tongue Neck: no LAD, masses or thyromegaly Cardiovascular: RRR, no m/r/g. No LE edema. Telemetry: Sinus tachycardia, no arrhythmias  Respiratory: CTA bilaterally, no  w/r/r. Normal respiratory effort. Abdomen: soft, ntnd Skin: no rash or induration seen on limited exam Musculoskeletal: grossly normal tone BUE/BLE Psychiatric: grossly normal mood and affect, speech fluent and appropriate Neurologic: grossly non-focal.          Labs on Admission:  Basic Metabolic Panel:  Recent Labs Lab 03/28/14 0950 03/30/14 0621  NA 131* 130*  K 4.0 5.0  CL 90* 99  CO2 23  --   GLUCOSE 408* 618*  BUN 20 18  CREATININE 0.79 0.80  CALCIUM 9.9  --    Liver Function Tests:  Recent Labs Lab 03/28/14 0950  AST 32  ALT 29  ALKPHOS 69  BILITOT 1.0  PROT 7.6  ALBUMIN 4.7   No results for input(s): LIPASE, AMYLASE in the last 168 hours. No results for input(s): AMMONIA in the last 168 hours. CBC:  Recent Labs Lab 03/28/14 0950 03/30/14 0621 03/30/14 0624  WBC 5.7  --  17.2*  NEUTROABS  --   --  11.7*  HGB 13.4 17.7* 14.8  HCT 38.4 52.0* 44.1  MCV 89.9  --  95.2  PLT 293  --  421*   Cardiac Enzymes: No results for input(s): CKTOTAL, CKMB, CKMBINDEX, TROPONINI in the last 168 hours.  BNP (last 3 results) No results for input(s): BNP in the last 8760 hours.  ProBNP (last 3 results) No results for input(s):  PROBNP in the last 8760 hours.  CBG:  Recent Labs Lab 03/28/14 1340 03/30/14 0558 03/30/14 0647 03/30/14 0754 03/30/14 0904  GLUCAP 81 516* 559* 458* 365*    Radiological Exams on Admission: No results found.   Assessment/Plan Principle problem DKA - admit to stepdown - insulin gtt with bmp q 4 hours while on the drip - supportive therapy with pain medication and antiemetics  Active Problems:   GERD (gastroesophageal reflux disease) - stable continue protonix   Code Status: full code DVT Prophylaxis: heparin Family Communication: None at bedside Disposition Plan: stepdown monitoring  Time spent: > 45 minutes  Velvet Bathe [facility] Pager [phone]

## 2014-03-30 NOTE — ED Notes (Signed)
Notified RN, Sharrie Rothman, pt. CBG 516.

## 2014-03-30 NOTE — ED Notes (Signed)
Per Dr. Florina Ou do not give dilaudid prior to UDS.  Pt is aware.

## 2014-03-30 NOTE — ED Notes (Addendum)
Per EMS pt presents d/t hyperglycemia, weakness, SOB, N/V/D and generalized abdominal pain.  Pt reported to EMS that at 0400 she checked her sugar and she gave herself 2 units of insulin upon recheck sugar had increased.  At this time pt is anxious and c/o SOB however O2 sats are 99% on room air. Pt given 4 mg IM zofran en route.

## 2014-03-30 NOTE — ED Notes (Signed)
Bed: GX21 Expected date: 03/30/14 Expected time: 5:34 AM Means of arrival: Ambulance Comments: Hypoglycemic, n,v

## 2014-03-30 NOTE — ED Notes (Signed)
I Stat-Chem 8, showed abnormal area throughout the testing results.  [place], EDP and Sharrie Rothman, RN notified.

## 2014-03-30 NOTE — Progress Notes (Signed)
Last [facility] call to [facility], no new orders.  Will continue to monitor and report.

## 2014-03-31 DIAGNOSIS — E876 Hypokalemia: Secondary | ICD-10-CM

## 2014-03-31 DIAGNOSIS — E0842 Diabetes mellitus due to underlying condition with diabetic polyneuropathy: Secondary | ICD-10-CM

## 2014-03-31 DIAGNOSIS — E1010 Type 1 diabetes mellitus with ketoacidosis without coma: Principal | ICD-10-CM

## 2014-03-31 LAB — GLUCOSE, CAPILLARY
GLUCOSE-CAPILLARY: 286 mg/dL — AB (ref 70–99)
GLUCOSE-CAPILLARY: 257 mg/dL — AB (ref 70–99)
GLUCOSE-CAPILLARY: 161 mg/dL — AB (ref 70–99)
GLUCOSE-CAPILLARY: 202 mg/dL — AB (ref 70–99)
GLUCOSE-CAPILLARY: 157 mg/dL — AB (ref 70–99)
GLUCOSE-CAPILLARY: 111 mg/dL — AB (ref 70–99)
GLUCOSE-CAPILLARY: 121 mg/dL — AB (ref 70–99)
GLUCOSE-CAPILLARY: 132 mg/dL — AB (ref 70–99)
GLUCOSE-CAPILLARY: 183 mg/dL — AB (ref 70–99)
GLUCOSE-CAPILLARY: 277 mg/dL — AB (ref 70–99)
GLUCOSE-CAPILLARY: 145 mg/dL — AB (ref 70–99)
GLUCOSE-CAPILLARY: 235 mg/dL — AB (ref 70–99)
GLUCOSE-CAPILLARY: 190 mg/dL — AB (ref 70–99)
Glucose-Capillary: 227 mg/dL — ABNORMAL HIGH (ref 70–99)
Glucose-Capillary: 104 mg/dL — ABNORMAL HIGH (ref 70–99)
Glucose-Capillary: 111 mg/dL — ABNORMAL HIGH (ref 70–99)
Glucose-Capillary: 125 mg/dL — ABNORMAL HIGH (ref 70–99)
Glucose-Capillary: 136 mg/dL — ABNORMAL HIGH (ref 70–99)
Glucose-Capillary: 152 mg/dL — ABNORMAL HIGH (ref 70–99)
Glucose-Capillary: 156 mg/dL — ABNORMAL HIGH (ref 70–99)
Glucose-Capillary: 164 mg/dL — ABNORMAL HIGH (ref 70–99)
Glucose-Capillary: 202 mg/dL — ABNORMAL HIGH (ref 70–99)
Glucose-Capillary: 236 mg/dL — ABNORMAL HIGH (ref 70–99)
Glucose-Capillary: 125 mg/dL — ABNORMAL HIGH (ref 70–99)
Glucose-Capillary: 224 mg/dL — ABNORMAL HIGH (ref 70–99)
Glucose-Capillary: 269 mg/dL — ABNORMAL HIGH (ref 70–99)
Glucose-Capillary: 179 mg/dL — ABNORMAL HIGH (ref 70–99)
Glucose-Capillary: 96 mg/dL (ref 70–99)
Glucose-Capillary: 156 mg/dL — ABNORMAL HIGH (ref 70–99)
Glucose-Capillary: 101 mg/dL — ABNORMAL HIGH (ref 70–99)

## 2014-03-31 LAB — BASIC METABOLIC PANEL
ANION GAP: 6 (ref 5–15)
Anion gap: 8 (ref 5–15)
Anion gap: 5 (ref 5–15)
Anion gap: 7 (ref 5–15)
Anion gap: 7 (ref 5–15)
BUN: 11 mg/dL (ref 6–23)
BUN: 9 mg/dL (ref 6–23)
BUN: 8 mg/dL (ref 6–23)
BUN: 8 mg/dL (ref 6–23)
BUN: 7 mg/dL (ref 6–23)
CALCIUM: 8.5 mg/dL (ref 8.4–10.5)
CHLORIDE: 116 mmol/L — AB (ref 96–112)
CHLORIDE: 110 mmol/L (ref 96–112)
CHLORIDE: 114 mmol/L — AB (ref 96–112)
CO2: 12 mmol/L — AB (ref 19–32)
CO2: 14 mmol/L — AB (ref 19–32)
CO2: 18 mmol/L — AB (ref 19–32)
CO2: 21 mmol/L (ref 19–32)
CO2: 15 mmol/L — AB (ref 19–32)
CREATININE: 0.69 mg/dL (ref 0.50–1.10)
CREATININE: 0.8 mg/dL (ref 0.50–1.10)
CREATININE: 0.69 mg/dL (ref 0.50–1.10)
Calcium: 8.4 mg/dL (ref 8.4–10.5)
Calcium: 8.6 mg/dL (ref 8.4–10.5)
Calcium: 8.5 mg/dL (ref 8.4–10.5)
Calcium: 8.4 mg/dL (ref 8.4–10.5)
Chloride: 111 mmol/L (ref 96–112)
Chloride: 111 mmol/L (ref 96–112)
Creatinine, Ser: 0.56 mg/dL (ref 0.50–1.10)
Creatinine, Ser: 0.7 mg/dL (ref 0.50–1.10)
GFR calc Af Amer: >90 mL/min (ref >90)
GFR calc non Af Amer: >90 mL/min (ref >90)
GFR calc non Af Amer: >90 mL/min (ref >90)
GFR calc non Af Amer: >90 mL/min (ref >90)
GFR calc non Af Amer: >90 mL/min (ref >90)
GFR calc non Af Amer: >90 mL/min (ref >90)
GLUCOSE: 136 mg/dL — AB (ref 70–99)
GLUCOSE: 271 mg/dL — AB (ref 70–99)
Glucose, Bld: 261 mg/dL — ABNORMAL HIGH (ref 70–99)
Glucose, Bld: 276 mg/dL — ABNORMAL HIGH (ref 70–99)
Glucose, Bld: 262 mg/dL — ABNORMAL HIGH (ref 70–99)
POTASSIUM: 3.8 mmol/L (ref 3.5–5.1)
POTASSIUM: 5.3 mmol/L — AB (ref 3.5–5.1)
Potassium: 4 mmol/L (ref 3.5–5.1)
Potassium: 3.5 mmol/L (ref 3.5–5.1)
Potassium: 3.4 mmol/L — ABNORMAL LOW (ref 3.5–5.1)
SODIUM: 131 mmol/L — AB (ref 135–145)
SODIUM: 135 mmol/L (ref 135–145)
SODIUM: 136 mmol/L (ref 135–145)
SODIUM: 137 mmol/L (ref 135–145)
Sodium: 136 mmol/L (ref 135–145)

## 2014-03-31 LAB — CBC
HEMATOCRIT: 31.1 % — AB (ref 36.0–46.0)
HEMOGLOBIN: 10.8 g/dL — AB (ref 12.0–15.0)
MCH: 31.6 pg (ref 26.0–34.0)
MCHC: 34.7 g/dL (ref 30.0–36.0)
MCV: 90.9 fL (ref 78.0–100.0)
PLATELETS: 190 10*3/uL (ref 150–400)
RBC: 3.42 MIL/uL — ABNORMAL LOW (ref 3.87–5.11)
RDW: 13.8 % (ref 11.5–15.5)
WBC: 5.3 10*3/uL (ref 4.0–10.5)

## 2014-03-31 LAB — CULTURE, GROUP A STREP

## 2014-03-31 MED ORDER — INSULIN GLARGINE 100 UNIT/ML ~~LOC~~ SOLN
14.0000 [IU] | Freq: Every day | SUBCUTANEOUS | Status: DC
  Administered 2014-03-31 – 2014-04-01 (×2): 14 [IU] via SUBCUTANEOUS
  Filled 2014-03-31 (×2): qty 0.14

## 2014-03-31 MED ORDER — INSULIN ASPART 100 UNIT/ML ~~LOC~~ SOLN
0.0000 [IU] | Freq: Three times a day (TID) | SUBCUTANEOUS | Status: DC
  Administered 2014-04-01: 5 [IU] via SUBCUTANEOUS

## 2014-03-31 MED ORDER — INSULIN ASPART 100 UNIT/ML ~~LOC~~ SOLN: 0.0000 [IU] | Freq: Every day | SUBCUTANEOUS | Status: DC

## 2014-03-31 MED ORDER — POTASSIUM CHLORIDE CRYS ER 20 MEQ PO TBCR
40.0000 meq | EXTENDED_RELEASE_TABLET | Freq: Once | ORAL | Status: AC
  Administered 2014-03-31: 40 meq via ORAL
  Filled 2014-03-31: qty 2

## 2014-03-31 NOTE — Progress Notes (Signed)
[facility] PROGRESS NOTE  NISHI NEISWONGER GYJ:[id number] DOB: 1980-12-09 DOA: 03/30/2014 PCP: [name], MD  Assessment/Plan:   DKA (diabetic ketoacidoses) - currently on insulin drip - Continue DKA order set pathway - Serum bicarb was 14 this AM as such plans were to continue insulin gtt. Discussed with nursing. - diabetic diet.  Active Problems:   GERD (gastroesophageal reflux disease) - stable, continue protonix  Diabetic gastroparesis - stable on reglan  Diabetic neuropathy - stable, will continue neurontin.  Hypokalemia - will replace orally.  Code Status:  Family Communication: None at bedside Disposition Plan: pending resolution of DKA in acidosis   Consultants:  None  Procedures:  None  Antibiotics:  None  HPI/Subjective: Pt has no new complaints. No acute issues reported overnight.  Objective: Filed Vitals:   03/31/14 1200  BP: 96/64  Pulse: 100  Temp:   Resp: 16    Intake/Output Summary (Last 24 hours) at 03/31/14 1314 Last data filed at 03/31/14 1200  Gross per 24 hour  Intake 3034.88 ml  Output   1400 ml  Net 1634.88 ml   Filed Weights   03/30/14 0823  Weight: 46.2 kg (101 lb 13.6 oz)    Exam:   General:  Pt in nad, alert and awake  Cardiovascular: rrr, no mrg  Respiratory: cta bl, no wheezes  Abdomen: soft, NT, ND  Musculoskeletal: no cyanosis or clubbing   Data Reviewed: Basic Metabolic Panel:  Recent Labs Lab 03/30/14 1750 03/30/14 2204 03/31/14 0205 03/31/14 0751 03/31/14 1150  NA 133* 133* 131* 135 136  K 3.7 3.6 4.0 3.5 3.4*  CL 113* 111 111 116* 111  CO2 14* 15* 12* 14* 18*  GLUCOSE 146* 110* 261* 136* 276*  BUN 13 13 11 9 8   CREATININE 0.69 0.63 0.69 0.56 0.80  CALCIUM 8.4 8.3* 8.4 8.6 8.5   Liver Function Tests:  Recent Labs Lab 03/28/14 0950  AST 32  ALT 29  ALKPHOS 69  BILITOT 1.0  PROT 7.6  ALBUMIN 4.7   No results for input(s): LIPASE, AMYLASE in the last 168 hours. No  results for input(s): AMMONIA in the last 168 hours. CBC:  Recent Labs Lab 03/28/14 0950 03/30/14 0621 03/30/14 0624 03/30/14 1017 03/31/14 0751  WBC 5.7  --  17.2* 18.4* 5.3  NEUTROABS  --   --  11.7*  --   --   HGB 13.4 17.7* 14.8 12.5 10.8*  HCT 38.4 52.0* 44.1 38.2 31.1*  MCV 89.9  --  95.2 96.0 90.9  PLT 293  --  421* 296 190   Cardiac Enzymes: No results for input(s): CKTOTAL, CKMB, CKMBINDEX, TROPONINI in the last 168 hours. BNP (last 3 results) No results for input(s): BNP in the last 8760 hours.  ProBNP (last 3 results) No results for input(s): PROBNP in the last 8760 hours.  CBG:  Recent Labs Lab 03/31/14 0432 03/31/14 0538 03/31/14 0657 03/31/14 0804 03/31/14 0911  GLUCAP 156* 121* 132* 125* 183*    Recent Results (from the past 240 hour(s))  Rapid strep screen     Status: None   Collection Time: 03/28/14  1:50 PM  Result Value Ref Range Status   Streptococcus, Group A Screen (Direct) NEGATIVE NEGATIVE Final    Comment: (NOTE) A Rapid Antigen test may result negative if the antigen level in the sample is below the detection level of this test. The FDA has not cleared this test as a stand-alone test therefore the rapid antigen negative result has reflexed to  a Group A Strep culture.   Culture, Group A Strep     Status: None (Preliminary result)   Collection Time: 03/28/14  1:50 PM  Result Value Ref Range Status   Specimen Description THROAT  Final   Special Requests NONE  Final   Culture   Final    Culture reincubated for better growth Performed at [facility]    Report Status PENDING  Incomplete  MRSA PCR Screening     Status: None   Collection Time: 03/30/14  8:26 AM  Result Value Ref Range Status   MRSA by PCR NEGATIVE NEGATIVE Final    Comment:        The GeneXpert MRSA Assay (FDA approved for NASAL specimens only), is one component of a comprehensive MRSA colonization surveillance program. It is not intended to diagnose  MRSA infection nor to guide or monitor treatment for MRSA infections.      Studies: No results found.  Scheduled Meds: . sodium chloride   Intravenous Once  . amitriptyline  50 mg Oral QHS  . gabapentin  200 mg Oral QHS  . heparin  5,000 Units Subcutaneous 3 times per day  . metoCLOPramide  10 mg Oral TID AC & HS  . pantoprazole  40 mg Oral Daily  . sodium chloride  3 mL Intravenous Q12H   Continuous Infusions: . sodium chloride Stopped (03/30/14 1246)  . dextrose 5 % and 0.45% NaCl 125 mL/hr at 03/31/14 0549  . insulin (NOVOLIN-R) infusion 3.6 Units/hr (03/31/14 1256)     Time spent: > 35 minutes    Velvet Bathe  [facility] Pager [phone]. If 7PM-7AM, please contact night-coverage at [email or web address], password [facility] 03/31/2014, 1:14 PM  LOS: 1 day

## 2014-03-31 NOTE — Progress Notes (Signed)
Report given to Day, RN to f/u on lab draw r/t insulin drip.

## 2014-04-01 ENCOUNTER — Other Ambulatory Visit: Payer: Self-pay | Admitting: Internal Medicine

## 2014-04-01 DIAGNOSIS — IMO0002 Reserved for concepts with insufficient information to code with codable children: Secondary | ICD-10-CM

## 2014-04-01 DIAGNOSIS — E1065 Type 1 diabetes mellitus with hyperglycemia: Secondary | ICD-10-CM

## 2014-04-01 DIAGNOSIS — E1011 Type 1 diabetes mellitus with ketoacidosis with coma: Secondary | ICD-10-CM

## 2014-04-01 LAB — BASIC METABOLIC PANEL
ANION GAP: 9 (ref 5–15)
Anion gap: 7 (ref 5–15)
BUN: 7 mg/dL (ref 6–23)
BUN: 5 mg/dL — ABNORMAL LOW (ref 6–23)
CHLORIDE: 106 mmol/L (ref 96–112)
CO2: 21 mmol/L (ref 19–32)
CO2: 21 mmol/L (ref 19–32)
CREATININE: 0.53 mg/dL (ref 0.50–1.10)
Calcium: 8.3 mg/dL — ABNORMAL LOW (ref 8.4–10.5)
Calcium: 8.6 mg/dL (ref 8.4–10.5)
Chloride: 103 mmol/L (ref 96–112)
Creatinine, Ser: 0.5 mg/dL (ref 0.50–1.10)
GFR calc Af Amer: >90 mL/min (ref >90)
GFR calc Af Amer: >90 mL/min (ref >90)
GFR calc non Af Amer: >90 mL/min (ref >90)
Glucose, Bld: 350 mg/dL — ABNORMAL HIGH (ref 70–99)
Glucose, Bld: 128 mg/dL — ABNORMAL HIGH (ref 70–99)
POTASSIUM: 3.1 mmol/L — AB (ref 3.5–5.1)
Potassium: 3.8 mmol/L (ref 3.5–5.1)
SODIUM: 131 mmol/L — AB (ref 135–145)
SODIUM: 136 mmol/L (ref 135–145)

## 2014-04-01 LAB — GLUCOSE, CAPILLARY
GLUCOSE-CAPILLARY: 277 mg/dL — AB (ref 70–99)
GLUCOSE-CAPILLARY: 229 mg/dL — AB (ref 70–99)
GLUCOSE-CAPILLARY: 130 mg/dL — AB (ref 70–99)
GLUCOSE-CAPILLARY: 125 mg/dL — AB (ref 70–99)
Glucose-Capillary: 344 mg/dL — ABNORMAL HIGH (ref 70–99)
Glucose-Capillary: 176 mg/dL — ABNORMAL HIGH (ref 70–99)
Glucose-Capillary: 297 mg/dL — ABNORMAL HIGH (ref 70–99)

## 2014-04-01 MED ORDER — DEXTROSE-NACL 5-0.9 % IV SOLN
INTRAVENOUS | Status: DC
  Administered 2014-04-01: 07:00:00 via INTRAVENOUS

## 2014-04-01 MED ORDER — ONDANSETRON 4 MG PO TBDP: 4.0000 mg | ORAL_TABLET | Freq: Three times a day (TID) | ORAL | Status: DC | PRN

## 2014-04-01 MED ORDER — POTASSIUM CHLORIDE CRYS ER 20 MEQ PO TBCR
40.0000 meq | EXTENDED_RELEASE_TABLET | Freq: Once | ORAL | Status: AC
  Administered 2014-04-01: 40 meq via ORAL
  Filled 2014-04-01: qty 2

## 2014-04-01 MED ORDER — DEXTROSE-NACL 5-0.9 % IV SOLN
INTRAVENOUS | Status: DC
  Filled 2014-04-01: qty 1000

## 2014-04-01 MED ORDER — TRAMADOL HCL 50 MG PO TABS: 50.0000 mg | ORAL_TABLET | Freq: Two times a day (BID) | ORAL | Status: DC | PRN

## 2014-04-01 MED ORDER — SODIUM CHLORIDE 0.9 % IV SOLN
INTRAVENOUS | Status: DC
  Administered 2014-04-01: 09:00:00 via INTRAVENOUS
  Filled 2014-04-01: qty 2.5

## 2014-04-01 NOTE — Discharge Summary (Signed)
Physician Discharge Summary  [name] PXT:[id number] DOB: May 06, 1980 DOA: 03/30/2014  PCP: [name], MD  Admit date: 03/30/2014 Discharge date: 04/01/2014  Time spent: > 35 minutes  Recommendations for Outpatient Follow-up:  1. Please reassess blood sugars and adjust hypoglycemic agents as needed 2. Reassess K levels (they were 3.1 on last check and I administered K dur 40 meq prior to discharge)  Discharge Diagnoses:  Active Problems:   GERD (gastroesophageal reflux disease)   DKA (diabetic ketoacidoses)   Discharge Condition: stable  Diet recommendation: diabetic diet  Filed Weights   03/30/14 0823  Weight: 46.2 kg (101 lb 13.6 oz)    History of present illness:  From original HPI: With history of type 1 diabetes mellitus reportedly brittle with multiple prior admissions for DKA. His presenting again today stating that she is been having some severe nausea and vomiting. As a result decided to get checked up and was found to have elevated blood sugars. She's also complaining of abdominal discomfort secondary to nausea and vomiting. She denies any fevers or chills or any sources of infections.  Hospital Course:  DKA - resolved with insulin gtt and SQ insulin - Patient is to check her blood sugars 3-4 times per day. She reports having novolog sliding scale and reports that her endocrinologist has taught her how to uptitrate her Lantus - Will have my secretary set up appointment in 2-3 days with endocrinologist for further evaluation and recommendations.  Hypokalemia - replaced prior to discharge with K dur 40 meq x 1  Procedures:  None  Consultations:  none  Discharge Exam: Filed Vitals:   04/01/14 0800  BP:   Pulse:   Temp: 97.2 F (36.2 C)  Resp:     General: Pt in nad, alert and awake Cardiovascular: rrr, no mrg Respiratory: cta bl, no wheezes  Discharge Instructions   Discharge Instructions    Call MD for:  extreme fatigue    Complete by:  As  directed      Call MD for:  persistant nausea and vomiting    Complete by:  As directed      Call MD for:  severe uncontrolled pain    Complete by:  As directed      Call MD for:  temperature >100.4    Complete by:  As directed      Diet - low sodium heart healthy    Complete by:  As directed      Discharge instructions    Complete by:  As directed   Please be sure to follow up with your endocrinologist in 2-3 days for further evaluation and recommendations.  Should your blood sugars remain elevated > 400 please contact your endocrinologist or present to an ed for further evaluation and recommendations.     Increase activity slowly    Complete by:  As directed           Current Discharge Medication List    START taking these medications   Details  traMADol (ULTRAM) 50 MG tablet Take 1 tablet (50 mg total) by mouth every 12 (twelve) hours as needed for moderate pain. Qty: 20 tablet, Refills: 0      CONTINUE these medications which have CHANGED   Details  ondansetron (ZOFRAN ODT) 4 MG disintegrating tablet Take 1 tablet (4 mg total) by mouth every 8 (eight) hours as needed for nausea or vomiting. Qty: 20 tablet, Refills: 0      CONTINUE these medications which have NOT CHANGED  Details  amitriptyline (ELAVIL) 50 MG tablet Take 50 mg by mouth at bedtime.    bismuth subsalicylate (PEPTO BISMOL) 262 MG/15ML suspension Take 30 mLs by mouth every 6 (six) hours as needed for indigestion or diarrhea or loose stools.    clotrimazole-betamethasone (LOTRISONE) cream Apply 1 application topically daily as needed (rash).    Cyanocobalamin (VITAMIN B-12 PO) Take 2 tablets by mouth 2 (two) times daily.    famotidine (PEPCID) 20 MG tablet Take 1 tablet (20 mg total) by mouth 2 (two) times daily. Qty: 20 tablet, Refills: 0    gabapentin (NEURONTIN) 100 MG capsule Take 200 mg by mouth at bedtime.    insulin aspart (NOVOLOG) 100 UNIT/ML injection Inject 1-3 Units into the skin 3 (three)  times daily with meals. CBG < 70: implement hypoglycemia protocol CBG > 400: call MD   CBG 70 - 120: 0 units CBG 121 - 150: 3 units CBG 151 - 200: 4 units CBG 201 - 250: 7 units CBG 251 - 300: 11 units CBG 301 - 350: 15 units CBG 351 - 400: 20 units CBG > 400: call MD    insulin glargine (LANTUS) 100 UNIT/ML injection Inject 0.15 mLs (15 Units total) into the skin every morning. Qty: 10 mL, Refills: 11    Melatonin 3 MG TABS Take 1-2 tablets by mouth at bedtime as needed (sleep).    metoCLOPramide (REGLAN) 10 MG tablet Take 1 tablet (10 mg total) by mouth 4 (four) times daily -  before meals and at bedtime. Qty: 120 tablet, Refills: 12    Multiple Vitamin (MULTIVITAMIN WITH MINERALS) TABS Take 1 tablet by mouth every morning. Diabetic support vitamin    omeprazole (PRILOSEC) 40 MG capsule Take 40 mg by mouth daily. Refills: 5    pantoprazole (PROTONIX) 40 MG tablet Take 1 tablet (40 mg total) by mouth daily. Qty: 30 tablet, Refills: 0    simethicone (MYLICON) 80 MG chewable tablet Chew 80 mg by mouth every 6 (six) hours as needed for flatulence.    glucagon (GLUCAGON EMERGENCY) 1 MG injection Inject 1 mg into the muscle once as needed (severe hypoglycemia). Qty: 2 each, Refills: prn      STOP taking these medications     oxyCODONE-acetaminophen (PERCOCET) 10-325 MG per tablet        Allergies  Allergen Reactions  . Scallops [Shellfish Allergy] Anaphylaxis  . Betamethasone Other (See Comments)    Other Reaction: Facial swelling  . Morphine And Related Itching  . Latex Rash      The results of significant diagnostics from this hospitalization (including imaging, microbiology, ancillary and laboratory) are listed below for reference.    Significant Diagnostic Studies: Dg Abd Acute W/chest  03/03/2014   CLINICAL DATA:  Nausea and vomiting.  EXAM: ACUTE ABDOMEN SERIES (ABDOMEN 2 VIEW & CHEST 1 VIEW)  COMPARISON:  CT 02/15/2014  FINDINGS: The cardiomediastinal  contours are normal. The lungs are clear. There is no free intra-abdominal air. There is a paucity of intra-abdominal bowel gas, no dilated bowel loops to suggest obstruction. No radiopaque calculi. Calcified granuloma are seen in the spleen. No acute osseous abnormalities are seen.  IMPRESSION: Paucity of intra-abdominal bowel gas. No dilated bowel loops to suggest obstruction.   Electronically Signed   By: Jeb Levering M.D.   On: 03/03/2014 21:09    Microbiology: Recent Results (from the past 240 hour(s))  Rapid strep screen     Status: None   Collection Time: 03/28/14  1:50 PM  Result Value Ref Range Status   Streptococcus, Group A Screen (Direct) NEGATIVE NEGATIVE Final    Comment: (NOTE) A Rapid Antigen test may result negative if the antigen level in the sample is below the detection level of this test. The FDA has not cleared this test as a stand-alone test therefore the rapid antigen negative result has reflexed to a Group A Strep culture.   Culture, Group A Strep     Status: None   Collection Time: 03/28/14  1:50 PM  Result Value Ref Range Status   Specimen Description THROAT  Final   Special Requests NONE  Final   Culture   Final    STREPTOCOCCUS,BETA HEMOLYTIC NOT GROUP A Performed at [facility]    Report Status 03/31/2014 FINAL  Final  MRSA PCR Screening     Status: None   Collection Time: 03/30/14  8:26 AM  Result Value Ref Range Status   MRSA by PCR NEGATIVE NEGATIVE Final    Comment:        The GeneXpert MRSA Assay (FDA approved for NASAL specimens only), is one component of a comprehensive MRSA colonization surveillance program. It is not intended to diagnose MRSA infection nor to guide or monitor treatment for MRSA infections.      Labs: Basic Metabolic Panel:  Recent Labs Lab 03/31/14 1150 03/31/14 1619 03/31/14 2236 04/01/14 0200 04/01/14 0619  NA 136 137 136 131* 136  K 3.4* 3.8 5.3* 3.8 3.1*  CL 111 110 114* 103 106  CO2 18* 21  15* 21 21  GLUCOSE 276* 271* 262* 350* 128*  BUN 8 8 7 7  5*  CREATININE 0.80 0.70 0.69 0.53 0.50  CALCIUM 8.5 8.4 8.5 8.3* 8.6   Liver Function Tests:  Recent Labs Lab 03/28/14 0950  AST 32  ALT 29  ALKPHOS 69  BILITOT 1.0  PROT 7.6  ALBUMIN 4.7   No results for input(s): LIPASE, AMYLASE in the last 168 hours. No results for input(s): AMMONIA in the last 168 hours. CBC:  Recent Labs Lab 03/28/14 0950 03/30/14 0621 03/30/14 0624 03/30/14 1017 03/31/14 0751  WBC 5.7  --  17.2* 18.4* 5.3  NEUTROABS  --   --  11.7*  --   --   HGB 13.4 17.7* 14.8 12.5 10.8*  HCT 38.4 52.0* 44.1 38.2 31.1*  MCV 89.9  --  95.2 96.0 90.9  PLT 293  --  421* 296 190   Cardiac Enzymes: No results for input(s): CKTOTAL, CKMB, CKMBINDEX, TROPONINI in the last 168 hours. BNP: BNP (last 3 results) No results for input(s): BNP in the last 8760 hours.  ProBNP (last 3 results) No results for input(s): PROBNP in the last 8760 hours.  CBG:  Recent Labs Lab 04/01/14 0209 04/01/14 0336 04/01/14 0448 04/01/14 0553 04/01/14 0655  GLUCAP 344* 277* 229* 130* 125*       Signed:  Velvet Bathe  [facility] 04/01/2014, 9:54 AM

## 2014-04-01 NOTE — Progress Notes (Addendum)
Shift Event: RN paged secondary to pt being transitioned off Insulin gtt. Rn states pt received 14U of Lantus, CBGs in 100s, last BMET with normal bicarb and AG. However, next resulted BMET showed bicarb of 15, CBGs 262, and normal gap. Instructed RN to placed pt back on Insulin gtt due to low bicarb, and initiate fluids D5 and 1/2 NS at 125 ml/hr. Will continue to monitor  Update: BMET with Na 131, changed fluids to D5 NS at 145ml/hr. Repeat BMET pending to see if can transition off gtt.  [place] [facility] [phone]

## 2014-04-01 NOTE — Progress Notes (Signed)
04/01/14  1030  Reviewed discharge instructions with patient. Patient verbalized understanding of discharge instructions. Copy of discharge instructions and prescriptions given to patient.

## 2014-04-01 NOTE — Progress Notes (Signed)
04/01/14  1030  Pt appointment is scheduled for 04/02/14 at 11 am with [facility] End[facility].

## 2014-04-02 ENCOUNTER — Encounter: Payer: Medicaid Other | Attending: Internal Medicine | Admitting: Nutrition

## 2014-04-02 ENCOUNTER — Ambulatory Visit: Payer: Medicaid Other | Admitting: Internal Medicine

## 2014-04-02 VITALS — Wt 110.0 lb

## 2014-04-02 DIAGNOSIS — K3184 Gastroparesis: Secondary | ICD-10-CM | POA: Insufficient documentation

## 2014-04-02 DIAGNOSIS — Z713 Dietary counseling and surveillance: Secondary | ICD-10-CM | POA: Diagnosis not present

## 2014-04-02 DIAGNOSIS — Z794 Long term (current) use of insulin: Secondary | ICD-10-CM | POA: Insufficient documentation

## 2014-04-02 DIAGNOSIS — E1043 Type 1 diabetes mellitus with diabetic autonomic (poly)neuropathy: Secondary | ICD-10-CM | POA: Diagnosis not present

## 2014-04-02 LAB — GLUCOSE, CAPILLARY
GLUCOSE-CAPILLARY: 115 mg/dL — AB (ref 70–99)
GLUCOSE-CAPILLARY: 131 mg/dL — AB (ref 70–99)
GLUCOSE-CAPILLARY: 221 mg/dL — AB (ref 70–99)
Glucose-Capillary: 120 mg/dL — ABNORMAL HIGH (ref 70–99)

## 2014-04-02 NOTE — Progress Notes (Signed)
Patient was here with mother to review what caused her to go into ketoacidosis last Friday.  She reports that she had had a strep throat 2 weeks prior to this, and started to get another sore throat.  She went to the ER and they told her that she did not have Strep.  They sent her home, and she developed diarrhea, and blood sugar went to over 300.  She says that when her blood sugar got to 350, she called the EMS and by the time they got to the hospital, her blood sugar was over 500.  She reports not having stopped any insulin, but instead, gave more insulin via injections, but blood sugars did not come down.  She began to have muscle cramps, and pain. So she call EMS  Typical day:   6AM  Tests blood sugars and they are between 50-150.  Takes 14u of Lantus.    7-8AM.  Says blood sugar is ususally lower than the 6AM by about 40-50 points.  Eats oatmeal with splenda and 12 ounces of milk (45-60 carbs).    9AM.  Tests blood sugars (which is ususally 50-60 points higher),  and will then take 1u of insulin.  Says Dr. Cruzita Lederer says to take more, but has not been home to begin the new doses she recommended.    12-1PM: lunch of soup and 1/2 sandwich, or smoothie with mil, V-8, berries, carrots.  She did not know to carb content of the drink, or lunch, but says she is taking 2u/carb choice. IS taking the insulin before she eats lunch and supper   2-4 PM: snacking on raw veg.  6PM: supper-carb contents vary, but continues to take 2u/carb choice before the meal.    10-11PM: does a correction dose per sliding scale.  Last night blood sugar was 211, she took 1u, at a 15 gram snack at 3AM, and FBS was 86.  (She was given her dose of 14u of Lantus at 1PM in the hospital yesterday).  2-3 AM snack of about 15 grams of carbs.  No insulin given.  She knows that she has gastroparesis, and says that she treats lows with juice of "whatever".  i explained that she should use glucose tablets, because they will work  faster, because they get absorbed from the stomach lining into the blood stream, and not have to go through the stomach into the intestines, and due to her gastroparesis, she can expect a longer rise in blood sugar.  She was given a sample of glucose tablets.    She took only 6u of lantus--and dose not know why that amount , except that she got her lantus late yesterday (1PM)  We discussed the importance of taking the insulin before the meal and her need to to keep a record of what is eaten and how much insulin she is covering the meal with.  She was reminded of the new insulin doses and corrections.  Written instructions for this were given to her from Dr. Arman Filter office note.  She agreed to keep a 1 week food record with what is eaten, how much insulin she takes, and blood sugar readings.  She will return in 1 week for me to evaluate it and see if she is counting carbs correctly.    We also reviewed what is needed before she can go back on the insulin pump--recorded blood sugars, recorded insulin doses and the need to keep appts. With Dr. Cruzita Lederer.  She reports not having  done any temp basal rates, or extended boluses for her gastroparesis while on the insulin pump.  I expllained each of them, and she agreed this would help her.

## 2014-04-03 NOTE — Patient Instructions (Addendum)
Keep a food record with insulin doses taken Test blood sugars before meals, and at bedtime. Return one week with food and blood sugar log

## 2014-04-20 ENCOUNTER — Encounter (HOSPITAL_COMMUNITY): Payer: Self-pay | Admitting: Emergency Medicine

## 2014-04-20 ENCOUNTER — Emergency Department (HOSPITAL_COMMUNITY)
Admission: EM | Admit: 2014-04-20 | Discharge: 2014-04-20 | Disposition: A | Discharge status: Home / Self Care | Payer: Medicaid Other | Attending: Emergency Medicine | Admitting: Emergency Medicine

## 2014-04-20 DIAGNOSIS — K219 Gastro-esophageal reflux disease without esophagitis: Secondary | ICD-10-CM | POA: Insufficient documentation

## 2014-04-20 DIAGNOSIS — R739 Hyperglycemia, unspecified: Secondary | ICD-10-CM

## 2014-04-20 DIAGNOSIS — Z3202 Encounter for pregnancy test, result negative: Secondary | ICD-10-CM | POA: Insufficient documentation

## 2014-04-20 DIAGNOSIS — R197 Diarrhea, unspecified: Secondary | ICD-10-CM | POA: Diagnosis not present

## 2014-04-20 DIAGNOSIS — Z87891 Personal history of nicotine dependence: Secondary | ICD-10-CM | POA: Insufficient documentation

## 2014-04-20 DIAGNOSIS — N189 Chronic kidney disease, unspecified: Secondary | ICD-10-CM | POA: Insufficient documentation

## 2014-04-20 DIAGNOSIS — Z79899 Other long term (current) drug therapy: Secondary | ICD-10-CM | POA: Insufficient documentation

## 2014-04-20 DIAGNOSIS — Z91040 Latex allergy status: Secondary | ICD-10-CM | POA: Diagnosis not present

## 2014-04-20 DIAGNOSIS — E1143 Type 2 diabetes mellitus with diabetic autonomic (poly)neuropathy: Secondary | ICD-10-CM | POA: Insufficient documentation

## 2014-04-20 DIAGNOSIS — E1165 Type 2 diabetes mellitus with hyperglycemia: Secondary | ICD-10-CM | POA: Diagnosis not present

## 2014-04-20 DIAGNOSIS — R000 Tachycardia, unspecified: Secondary | ICD-10-CM | POA: Diagnosis not present

## 2014-04-20 DIAGNOSIS — Z794 Long term (current) use of insulin: Secondary | ICD-10-CM | POA: Insufficient documentation

## 2014-04-20 DIAGNOSIS — F419 Anxiety disorder, unspecified: Secondary | ICD-10-CM | POA: Insufficient documentation

## 2014-04-20 DIAGNOSIS — R109 Unspecified abdominal pain: Secondary | ICD-10-CM | POA: Diagnosis present

## 2014-04-20 DIAGNOSIS — N390 Urinary tract infection, site not specified: Secondary | ICD-10-CM | POA: Diagnosis not present

## 2014-04-20 DIAGNOSIS — D649 Anemia, unspecified: Secondary | ICD-10-CM | POA: Insufficient documentation

## 2014-04-20 LAB — BLOOD GAS, VENOUS
Acid-base deficit: 1 mmol/L (ref 0.0–2.0)
BICARBONATE: 23 meq/L (ref 20.0–24.0)
O2 Saturation: 86.5 %
PATIENT TEMPERATURE: 98.6
PH VEN: 7.398 — AB (ref 7.250–7.300)
PO2 VEN: 51.9 mmHg — AB (ref 30.0–45.0)
TCO2: 20.7 mmol/L (ref 0–100)
pCO2, Ven: 38.2 mmHg — ABNORMAL LOW (ref 45.0–50.0)

## 2014-04-20 LAB — URINALYSIS, ROUTINE W REFLEX MICROSCOPIC
Bilirubin Urine: NEGATIVE
Glucose, UA: >1000 mg/dL — AB
Ketones, ur: 15 mg/dL — AB
Nitrite: NEGATIVE
PH: 6.5 (ref 5.0–8.0)
Protein, ur: NEGATIVE mg/dL
SPECIFIC GRAVITY, URINE: 1.036 — AB (ref 1.005–1.030)
Urobilinogen, UA: 0.2 mg/dL (ref 0.0–1.0)

## 2014-04-20 LAB — URINE MICROSCOPIC-ADD ON

## 2014-04-20 LAB — CBC WITH DIFFERENTIAL/PLATELET
BASOS ABS: 0 10*3/uL (ref 0.0–0.1)
Basophils Relative: 0 % (ref 0–1)
EOS ABS: 0.1 10*3/uL (ref 0.0–0.7)
Eosinophils Relative: 1 % (ref 0–5)
HCT: 40.7 % (ref 36.0–46.0)
Hemoglobin: 13.4 g/dL (ref 12.0–15.0)
LYMPHS ABS: 1.6 10*3/uL (ref 0.7–4.0)
LYMPHS PCT: 29 % (ref 12–46)
MCH: 30.8 pg (ref 26.0–34.0)
MCHC: 32.9 g/dL (ref 30.0–36.0)
MCV: 93.6 fL (ref 78.0–100.0)
Monocytes Absolute: 0.4 10*3/uL (ref 0.1–1.0)
Monocytes Relative: 7 % (ref 3–12)
Neutro Abs: 3.4 10*3/uL (ref 1.7–7.7)
Neutrophils Relative %: 62 % (ref 43–77)
PLATELETS: 298 10*3/uL (ref 150–400)
RBC: 4.35 MIL/uL (ref 3.87–5.11)
RDW: 13 % (ref 11.5–15.5)
WBC: 5.6 10*3/uL (ref 4.0–10.5)

## 2014-04-20 LAB — COMPREHENSIVE METABOLIC PANEL
ALT: 17 U/L (ref 0–35)
ANION GAP: 12 (ref 5–15)
AST: 25 U/L (ref 0–37)
Albumin: 4.8 g/dL (ref 3.5–5.2)
Alkaline Phosphatase: 58 U/L (ref 39–117)
BUN: 13 mg/dL (ref 6–23)
CO2: 21 mmol/L (ref 19–32)
CREATININE: 0.71 mg/dL (ref 0.50–1.10)
Calcium: 9.3 mg/dL (ref 8.4–10.5)
Chloride: 98 mmol/L (ref 96–112)
GFR calc non Af Amer: >90 mL/min (ref >90)
Glucose, Bld: 449 mg/dL — ABNORMAL HIGH (ref 70–99)
Potassium: 4.5 mmol/L (ref 3.5–5.1)
SODIUM: 131 mmol/L — AB (ref 135–145)
TOTAL PROTEIN: 7.7 g/dL (ref 6.0–8.3)
Total Bilirubin: 0.5 mg/dL (ref 0.3–1.2)

## 2014-04-20 LAB — LIPASE, BLOOD: LIPASE: 11 U/L (ref 11–59)

## 2014-04-20 LAB — POC URINE PREG, ED: Preg Test, Ur: NEGATIVE

## 2014-04-20 MED ORDER — CEPHALEXIN 500 MG PO CAPS: 500.0000 mg | ORAL_CAPSULE | Freq: Four times a day (QID) | ORAL | Status: DC

## 2014-04-20 MED ORDER — SODIUM CHLORIDE 0.9 % IV SOLN
1000.0000 mL | Freq: Once | INTRAVENOUS | Status: AC
  Administered 2014-04-20: 1000 mL via INTRAVENOUS

## 2014-04-20 MED ORDER — METOCLOPRAMIDE HCL 10 MG PO TABS: 10.0000 mg | ORAL_TABLET | Freq: Three times a day (TID) | ORAL | Status: DC

## 2014-04-20 MED ORDER — FENTANYL CITRATE 0.05 MG/ML IJ SOLN
50.0000 ug | Freq: Once | INTRAMUSCULAR | Status: AC
  Administered 2014-04-20: 50 ug via INTRAVENOUS
  Filled 2014-04-20: qty 2

## 2014-04-20 MED ORDER — TRAMADOL HCL 50 MG PO TABS: 50.0000 mg | ORAL_TABLET | Freq: Two times a day (BID) | ORAL | Status: DC | PRN

## 2014-04-20 MED ORDER — DEXTROSE 5 % IV SOLN
1.0000 g | Freq: Once | INTRAVENOUS | Status: AC
  Administered 2014-04-20: 1 g via INTRAVENOUS
  Filled 2014-04-20: qty 10

## 2014-04-20 MED ORDER — SODIUM CHLORIDE 0.9 % IV SOLN
1000.0000 mL | INTRAVENOUS | Status: DC
  Administered 2014-04-20: 1000 mL via INTRAVENOUS

## 2014-04-20 MED ORDER — INSULIN ASPART 100 UNIT/ML ~~LOC~~ SOLN
8.0000 [IU] | Freq: Once | SUBCUTANEOUS | Status: AC
  Administered 2014-04-20: 8 [IU] via SUBCUTANEOUS
  Filled 2014-04-20: qty 1

## 2014-04-20 NOTE — Discharge Instructions (Signed)
Blood Glucose Monitoring °Monitoring your blood glucose (also know as blood sugar) helps you to manage your diabetes. It also helps you and your health care provider monitor your diabetes and determine how well your treatment plan is working. °WHY SHOULD YOU MONITOR YOUR BLOOD GLUCOSE? °· It can help you understand how food, exercise, and medicine affect your blood glucose. °· It allows you to know what your blood glucose is at any given moment. You can quickly tell if you are having low blood glucose (hypoglycemia) or high blood glucose (hyperglycemia). °· It can help you and your health care provider know how to adjust your medicines. °· It can help you understand how to manage an illness or adjust medicine for exercise. °WHEN SHOULD YOU TEST? °Your health care provider will help you decide how often you should check your blood glucose. This may depend on the type of diabetes you have, your diabetes control, or the types of medicines you are taking. Be sure to write down all of your blood glucose readings so that this information can be reviewed with your health care provider. See below for examples of testing times that your health care provider may suggest. °Type 1 Diabetes °· Test 4 times a day if you are in good control, using an insulin pump, or perform multiple daily injections. °· If your diabetes is not well controlled or if you are sick, you may need to monitor more often. °· It is a good idea to also monitor: °· Before and after exercise. °· Between meals and 2 hours after a meal. °· Occasionally between 2:00 a.m. and 3:00 a.m. °Type 2 Diabetes °· It can vary with each person, but generally, if you are on insulin, test 4 times a day. °· If you take medicines by mouth (orally), test 2 times a day. °· If you are on a controlled diet, test once a day. °· If your diabetes is not well controlled or if you are sick, you may need to monitor more often. °HOW TO MONITOR YOUR BLOOD GLUCOSE °Supplies  Needed °· Blood glucose meter. °· Test strips for your meter. Each meter has its own strips. You must use the strips that go with your own meter. °· A pricking needle (lancet). °· A device that holds the lancet (lancing device). °· A journal or log book to write down your results. °Procedure °· Wash your hands with soap and water. Alcohol is not preferred. °· Prick the side of your finger (not the tip) with the lancet. °· Gently milk the finger until a small drop of blood appears. °· Follow the instructions that come with your meter for inserting the test strip, applying blood to the strip, and using your blood glucose meter. °Other Areas to Get Blood for Testing °Some meters allow you to use other areas of your body (other than your finger) to test your blood. These areas are called alternative sites. The most common alternative sites are: °· The forearm. °· The thigh. °· The back area of the lower leg. °· The palm of the hand. °The blood flow in these areas is slower. Therefore, the blood glucose values you get may be delayed, and the numbers are different from what you would get from your fingers. Do not use alternative sites if you think you are having hypoglycemia. Your reading will not be accurate. Always use a finger if you are having hypoglycemia. Also, if you cannot feel your lows (hypoglycemia unawareness), always use your fingers for your   blood glucose checks. ADDITIONAL TIPS FOR GLUCOSE MONITORING  Do not reuse lancets.  Always carry your supplies with you.  All blood glucose meters have a 24-hour "hotline" number to call if you have questions or need help.  Adjust (calibrate) your blood glucose meter with a control solution after finishing a few boxes of strips. BLOOD GLUCOSE RECORD KEEPING It is a good idea to keep a daily record or log of your blood glucose readings. Most glucose meters, if not all, keep your glucose records stored in the meter. Some meters come with the ability to download  your records to your home computer. Keeping a record of your blood glucose readings is especially helpful if you are wanting to look for patterns. Make notes to go along with the blood glucose readings because you might forget what happened at that exact time. Keeping good records helps you and your health care provider to work together to achieve good diabetes management.  Document Released: 02/11/2003 Document Revised: 06/25/2013 Document Reviewed: 07/03/2012 [facility] Patient Information 2015 [place], [place]. This information is not intended to replace advice given to you by your health care provider. Make sure you discuss any questions you have with your health care provider.  Urinary Tract Infection Urinary tract infections (UTIs) can develop anywhere along your urinary tract. Your urinary tract is your body's drainage system for removing wastes and extra water. Your urinary tract includes two kidneys, two ureters, a bladder, and a urethra. Your kidneys are a pair of bean-shaped organs. Each kidney is about the size of your fist. They are located below your ribs, one on each side of your spine. CAUSES Infections are caused by microbes, which are microscopic organisms, including fungi, viruses, and bacteria. These organisms are so small that they can only be seen through a microscope. Bacteria are the microbes that most commonly cause UTIs. SYMPTOMS  Symptoms of UTIs may vary by age and gender of the patient and by the location of the infection. Symptoms in young women typically include a frequent and intense urge to urinate and a painful, burning feeling in the bladder or urethra during urination. Older women and men are more likely to be tired, shaky, and weak and have muscle aches and abdominal pain. A fever may mean the infection is in your kidneys. Other symptoms of a kidney infection include pain in your back or sides below the ribs, nausea, and vomiting. DIAGNOSIS To diagnose a UTI, your  caregiver will ask you about your symptoms. Your caregiver also will ask to provide a urine sample. The urine sample will be tested for bacteria and white blood cells. White blood cells are made by your body to help fight infection. TREATMENT  Typically, UTIs can be treated with medication. Because most UTIs are caused by a bacterial infection, they usually can be treated with the use of antibiotics. The choice of antibiotic and length of treatment depend on your symptoms and the type of bacteria causing your infection. HOME CARE INSTRUCTIONS  If you were prescribed antibiotics, take them exactly as your caregiver instructs you. Finish the medication even if you feel better after you have only taken some of the medication.  Drink enough water and fluids to keep your urine clear or pale yellow.  Avoid caffeine, tea, and carbonated beverages. They tend to irritate your bladder.  Empty your bladder often. Avoid holding urine for long periods of time.  Empty your bladder before and after sexual intercourse.  After a bowel movement, women should  cleanse from front to back. Use each tissue only once. SEEK MEDICAL CARE IF:   You have back pain.  You develop a fever.  Your symptoms do not begin to resolve within 3 days. SEEK IMMEDIATE MEDICAL CARE IF:   You have severe back pain or lower abdominal pain.  You develop chills.  You have nausea or vomiting.  You have continued burning or discomfort with urination. MAKE SURE YOU:   Understand these instructions.  Will watch your condition.  Will get help right away if you are not doing well or get worse. Document Released: 11/18/2004 Document Revised: 08/10/2011 Document Reviewed: 03/19/2011 [facility] Patient Information 2015 [place], [place]. This information is not intended to replace advice given to you by your health care provider. Make sure you discuss any questions you have with your health care provider.

## 2014-04-20 NOTE — ED Provider Notes (Signed)
CSN: [id number]     Arrival date & time 04/20/14  1524 History   First MD Initiated Contact with Patient 04/20/14 1701     Chief Complaint  Patient presents with  . Abdominal Pain  . Nausea  . Diarrhea    HPI Pt has history of DKA and gastroparesis.  She has been having trouble for the last couple of weeks.  She vomited once yesterday but none today.  She has had nausea though.  She has been having diarrhea.  None  Today.  Yesterday she had two episodes.  The symptoms are getting better but her pain is not improving much.   The pain is in the entire abdomen.  She usually takes oxycodone and ultram but ran out of that medication.  Her sugars have been running high and that concerned her because she has had dka in the past.    She has tried bentyl but it has not helped for the last week. Past Medical History  Diagnosis Date  . Diabetes mellitus   . Thyroid disease     hypothyroidism associated with pregnancy  . Back pain   . Panic attack   . PONV (postoperative nausea and vomiting)   . Anxiety   . GERD (gastroesophageal reflux disease)   . DM gastroparesis   . Chronic kidney disease   . Anemia    Past Surgical History  Procedure Laterality Date  . Laparoscopy      age 2  . Back surgery      age 2  . Tooth extraction    . Root canal  10/10/12   Family History  Problem Relation Age of Onset  . Diabetes Maternal Grandmother   . Cancer Maternal Grandfather     Small cell lung cancer  . Cancer Maternal Grandmother     Leukemia   History  Substance Use Topics  . Smoking status: Former Smoker    Quit date: 04/19/2000  . Smokeless tobacco: Former Systems developer  . Alcohol Use: Yes     Comment: RARE SIPS OF WINE   OB History    No data available     Review of Systems  All other systems reviewed and are negative.     Allergies  Scallops; Morphine and related; and Latex  Home Medications   Prior to Admission medications   Medication Sig Start Date End Date Taking?  Authorizing Provider  amitriptyline (ELAVIL) 50 MG tablet Take 50 mg by mouth at bedtime.   Yes Historical Provider, MD  bismuth subsalicylate (PEPTO BISMOL) 262 MG/15ML suspension Take 30 mLs by mouth every 6 (six) hours as needed for indigestion or diarrhea or loose stools.   Yes Historical Provider, MD  clotrimazole-betamethasone (LOTRISONE) cream Apply 1 application topically daily as needed (rash).   Yes Historical Provider, MD  Cyanocobalamin (VITAMIN B-12 PO) Take 5,000 tablets by mouth 2 (two) times daily.    Yes Historical Provider, MD  dicyclomine (BENTYL) 10 MG capsule Take 10 mg by mouth 4 (four) times daily -  before meals and at bedtime.   Yes Historical Provider, MD  famotidine (PEPCID) 20 MG tablet Take 1 tablet (20 mg total) by mouth 2 (two) times daily. Patient taking differently: Take 20 mg by mouth 2 (two) times daily as needed for heartburn.  11/05/13  Yes Nishant Dhungel, MD  gabapentin (NEURONTIN) 100 MG capsule Take 200 mg by mouth at bedtime.   Yes Historical Provider, MD  glucagon (GLUCAGON EMERGENCY) 1 MG injection Inject 1 mg  into the muscle once as needed (severe hypoglycemia). 02/19/14  Yes Philemon Kingdom, MD  insulin aspart (NOVOLOG) 100 UNIT/ML injection Inject 1-3 Units into the skin 3 (three) times daily with meals. CBG < 70: implement hypoglycemia protocol CBG > 400: call MD   CBG 70 - 120: 0 units CBG 121 - 150: 3 units CBG 151 - 200: 4 units CBG 201 - 250: 7 units CBG 251 - 300: 11 units CBG 301 - 350: 15 units CBG 351 - 400: 20 units CBG > 400: call MD 02/01/13  Yes Donita Brooks, NP  insulin glargine (LANTUS) 100 UNIT/ML injection Inject 0.15 mLs (15 Units total) into the skin every morning. Patient taking differently: Inject 14 Units into the skin every morning.  02/19/14  Yes Philemon Kingdom, MD  Melatonin 3 MG TABS Take 1-2 tablets by mouth at bedtime as needed (sleep).   Yes Historical Provider, MD  Multiple Vitamin (MULTIVITAMIN WITH MINERALS)  TABS Take 1 tablet by mouth every morning. Diabetic support vitamin   Yes Historical Provider, MD  ondansetron (ZOFRAN ODT) 4 MG disintegrating tablet Take 1 tablet (4 mg total) by mouth every 8 (eight) hours as needed for nausea or vomiting. 04/01/14  Yes Velvet Bathe, MD  oxyCODONE-acetaminophen (PERCOCET) 10-325 MG per tablet Take 1 tablet by mouth every 8 (eight) hours as needed for pain.   Yes Historical Provider, MD  pantoprazole (PROTONIX) 40 MG tablet Take 1 tablet (40 mg total) by mouth daily. 02/04/14  Yes Reyne Dumas, MD  simethicone (MYLICON) 80 MG chewable tablet Chew 80 mg by mouth every 6 (six) hours as needed for flatulence.   Yes Historical Provider, MD  cephALEXin (KEFLEX) 500 MG capsule Take 1 capsule (500 mg total) by mouth 4 (four) times daily. 04/20/14   Dorie Rank, MD  metoCLOPramide (REGLAN) 10 MG tablet Take 1 tablet (10 mg total) by mouth 4 (four) times daily -  before meals and at bedtime. 04/20/14   Dorie Rank, MD  traMADol (ULTRAM) 50 MG tablet Take 1 tablet (50 mg total) by mouth every 12 (twelve) hours as needed for moderate pain. 04/20/14   Dorie Rank, MD   BP 111/72 mmHg  Pulse 118  Temp(Src) 98.3 F (36.8 C) (Oral)  Resp 20  SpO2 99% Physical Exam  Constitutional: She appears well-developed and well-nourished. No distress.  HENT:  Head: Normocephalic and atraumatic.  Right Ear: External ear normal.  Left Ear: External ear normal.  Eyes: Conjunctivae are normal. Right eye exhibits no discharge. Left eye exhibits no discharge. No scleral icterus.  Neck: Neck supple. No tracheal deviation present.  Cardiovascular: Regular rhythm and intact distal pulses.  Tachycardia present.   Pulmonary/Chest: Effort normal and breath sounds normal. No stridor. No respiratory distress. She has no wheezes. She has no rales.  Abdominal: Soft. Bowel sounds are normal. She exhibits no distension. There is no tenderness. There is no rebound and no guarding.  Musculoskeletal: She exhibits  no edema or tenderness.  Neurological: She is alert. She has normal strength. No cranial nerve deficit (no facial droop, extraocular movements intact, no slurred speech) or sensory deficit. She exhibits normal muscle tone. She displays no seizure activity. Coordination normal.  Skin: Skin is warm and dry. No rash noted.  Psychiatric: She has a normal mood and affect.  Nursing note and vitals reviewed.   ED Course  Procedures (including critical care time) Labs Review Labs Reviewed  COMPREHENSIVE METABOLIC PANEL - Abnormal; Notable for the following:  Sodium 131 (*)    Glucose, Bld 449 (*)    All other components within normal limits  URINALYSIS, ROUTINE W REFLEX MICROSCOPIC - Abnormal; Notable for the following:    APPearance CLOUDY (*)    Specific Gravity, Urine 1.036 (*)    Glucose, UA >1000 (*)    Hgb urine dipstick TRACE (*)    Ketones, ur 15 (*)    Leukocytes, UA MODERATE (*)    All other components within normal limits  BLOOD GAS, VENOUS - Abnormal; Notable for the following:    pH, Ven 7.398 (*)    pCO2, Ven 38.2 (*)    pO2, Ven 51.9 (*)    All other components within normal limits  URINE MICROSCOPIC-ADD ON - Abnormal; Notable for the following:    Bacteria, UA MANY (*)    All other components within normal limits  CBC WITH DIFFERENTIAL/PLATELET  LIPASE, BLOOD  POC URINE PREG, ED    Medications  0.9 %  sodium chloride infusion (1,000 mLs Intravenous New Bag/Given 04/20/14 1741)    Followed by  0.9 %  sodium chloride infusion (1,000 mLs Intravenous New Bag/Given 04/20/14 1741)    Followed by  0.9 %  sodium chloride infusion (1,000 mLs Intravenous New Bag/Given 04/20/14 1741)  fentaNYL (SUBLIMAZE) injection 50 mcg (not administered)  fentaNYL (SUBLIMAZE) injection 50 mcg (50 mcg Intravenous Given 04/20/14 1740)  insulin aspart (novoLOG) injection 8 Units (8 Units Subcutaneous Given 04/20/14 1740)  cefTRIAXone (ROCEPHIN) 1 g in dextrose 5 % 50 mL IVPB (1 g Intravenous  New Bag/Given 04/20/14 1814)     MDM   Final diagnoses:  UTI (lower urinary tract infection)  Hyperglycemia   Pt has a uti.  Hyperglycemic but no DKA.  No vomiting in the ED.  Treated with pain meds in the ED.  Will dc home on oral abx for uti.  Reglan and ultram refilled.  Monitor blood sugar closely.   Dorie Rank, MD 04/20/14 [phone]

## 2014-04-20 NOTE — ED Notes (Signed)
Pt w/ hx of gastroparesis.  States that she has been having gen abd pain, nausea, and diarrhea x 2 wks.

## 2014-05-07 ENCOUNTER — Ambulatory Visit: Payer: Medicaid Other | Admitting: Internal Medicine

## 2014-05-09 ENCOUNTER — Encounter: Payer: Self-pay | Admitting: Internal Medicine

## 2014-05-09 ENCOUNTER — Ambulatory Visit (INDEPENDENT_AMBULATORY_CARE_PROVIDER_SITE_OTHER): Payer: Medicaid Other | Admitting: Internal Medicine

## 2014-05-09 VITALS — BP 98/58 | HR 118 | Temp 98.0°F | Resp 12 | Wt 100.0 lb

## 2014-05-09 DIAGNOSIS — E1065 Type 1 diabetes mellitus with hyperglycemia: Secondary | ICD-10-CM

## 2014-05-09 DIAGNOSIS — IMO0002 Reserved for concepts with insufficient information to code with codable children: Secondary | ICD-10-CM

## 2014-05-09 NOTE — Progress Notes (Signed)
Patient ID: [name], female   DOB: 1981/02/03, 34 y.o.   MRN: [id number]  HPI: [name] is a 34 y.o.-year-old woman returning for f/u for DM1, dx 2005, uncontrolled, with complications (hyperglycemia +/- DKA + coma, severe hypoglycemia, gastroparesis, peripheral neuropathy). She has been admitted with DKA multiple times in the past, with glucose levels up to 1013 and CO2 down to <5. Last visit 1.5 mo ago.   Last hemoglobin A1c was: Lab Results  Component Value Date   HGBA1C 11.0* 02/03/2014   HGBA1C 12.5* 11/02/2013   HGBA1C 11.8* 05/18/2013   She was on an insulin pump: Omnipod x 2 years - she liked it much better than the insulin injection, but in 14 mo, she went to the hospital 18x. She was advised to stop it. Had first DKA episode 3 years ago while in early labor.   She was in the ED 3x since last visit 1.5 mo ago.  She is on: - Lantus 15 units in a.m. -may skip this when sugars are normal  - mealtime Novolog: 1 unit for 1 carb choice (15g of carbs) >> ends up with 2-4 units, but skips the insulin when sugars <100, only taking it if >180, taking 1 unit. - Novolog sliding scale: - 200-300: + 1 unit - 301-400: + 2 units - >400: + 3 units  She has more hyperglycemia in the week before her menses.  Pt checks her sugars 5-6 a day and they are (does not bring meter or log): - am: 60-180 >> 80-140 >> 90-160 >> 80-110 >> 80s-110 >> 80-110 - before lunch: 200s >> 200-300s >> 100-180 >> upper 100s >> 120-160 >> 110-180 (200-HI if sick) - before dinner: too high to read >> 300-400s >> 210 usually >> 140-200 >> 160-180 >> 110-190 (200-HI if sick) - bedtime: >> 300-400s >> 280-290 >> 150-300s >> 140s >> 140s Lowest: 52 x1; highest: 389 >> 200's except when sick Snacks in the pm: raw veggies and fruit.   Her 90 year old son saved her life twice, by performing CPR or injecting glucagon.  Pt does not have chronic kidney disease, last BUN/creatinine was:  Lab Results  Component  Value Date   BUN 13 04/20/2014   CREATININE 0.71 04/20/2014  She is on Lisinopril.  Last set of lipids -  Per PCP  - will bring me records Lab Results  Component Value Date   CHOL 256* 08/18/2012   HDL 31* 08/18/2012   LDLCALC UNABLE TO CALCULATE IF TRIGLYCERIDE OVER 400 mg/dL 04/28/2012   TRIG 706* 04/28/2012   CHOLHDL 5.4 04/28/2012  On Lovastatin. Pt's last eye exam was in 08/2013: cataracts a little worse. No DR. Has numbness and tingling in her feet/hands - on neurontin She has nausea - gastroparesis. On Reglan and Protonix. She will see GI (Dr Collene Mares) soon.  Last TSH: Lab Results  Component Value Date   TSH 5.039* 02/19/2014   She was on Levothyroxine in the past, but taken off several years ago.  I reviewed pt's medications, allergies, PMH, social hx, family hx, and changes were documented in the history of present illness. Otherwise, unchanged from my initial visit note. Since last visit, she stopped the Reglan and naproxen. She continues amitriptyline and Bentyl.  ROS: Constitutional: + weight loss, + fatigue, + both: subjective hyperthermia/hypothermia, + excessive urination, + poor sleep Eyes: + blurry vision, no xerophthalmia ENT: No sore throat, no nodules palpated in throat, no dysphagia/odynophagia, no hoarseness Cardiovascular: + Occasional CP/no SOB/palpitations/leg  swelling Respiratory: No cough/no SOB Gastrointestinal: + acid reflux/+ N/no V/+ D Musculoskeletal: + both: muscle/joint aches Skin: no rashes, + itching, + hair loss, + easy bruising Neurological: no tremors/numbness/tingling/dizziness, + HA Menstrual irregularity  PE: BP 98/58 mmHg  Pulse 118  Temp(Src) 98 F (36.7 C) (Oral)  Resp 12  Wt 100 lb (45.36 kg)  SpO2 98% Body mass index is 16.64 kg/(m^2). Wt Readings from Last 3 Encounters:  05/09/14 100 lb (45.36 kg)  04/02/14 110 lb (49.896 kg)  03/30/14 101 lb 13.6 oz (46.2 kg)   Constitutional: Very thin, in NAD Eyes: PERRLA, EOMI, no  exophthalmos ENT: moist mucous membranes, no thyromegaly, no cervical lymphadenopathy Cardiovascular: tachycardia, RR, No MRG Respiratory: CTA B Gastrointestinal: abdomen soft, NT, ND, BS+ Musculoskeletal: no deformities, strength intact in all 4 Skin: moist, warm, no rashes Neurological: no tremor with outstretched hands, DTR normal in all 4  ASSESSMENT: 1. DM1, uncontrolled, with complications - h/o severe lows  >> glucagon inj - highly fluctuating CBGs  - repeated admissions in the past for DKA/hyperglycemia - gastroparesis - on Zofran prn, also Protonix, Reglan - peripheral neuropathy - on Neurontin  PLAN:  1.  Pt with long standing and very uncontrolled DM1, with complications and repeated admissions for DKA. Her sugars still have a staircase effect (increased sugars throughout the day). At last visits, we changed the insulin to carb ratio, we then tightened her sliding scale, however, she is not following any of these instructions, and most of the time she does not bolus for meals because she mentions that she may drop afterwards. She injects 2-4 units of insulin and only if her sugars are higher than 180 before a meal. Also, many times, she is not even taking her Lantus, because her sugars are in the 80s, she mentions. This mostly happens after her acupuncture sessions, after which, for 2-3 days, she does not need insulin at all, as her sugars are maintained in the 80s... I agree withthe patient and her mother, who accompanies the patient today and offers part of the history, that the pump would be the best thing for her, however, I would like her to demonstrate at least some compliance with sugar checks, with visits, and also with her treatment. She agrees to bring a sugar log at next visit with me and also at next visit with the diabetes educator. She did bring her sugar log at last visit with Leonia Reader the diabetes educator, a month ago. - I advised her to come for a refresher in  carb counting - an appointment with Antonieta Iba (nutritionist). - I advised her to return in 3-4 weeks for an appoint with Leonia Reader for a pump start. She has her previous Omni pod pump, and continues to receive supplies for it. - I advised her to do the following: Patient Instructions  Please schedule an appt with Antonieta Iba  - for carb counting refresher.  Schedule an appt with Leonia Reader for pump start in 3-4 weeks.  Please continue: - Lantus 15 units in a.m. - mealtime Novolog: 1 unit for 1 carb choice (15g of carbs)  - Novolog sliding scale: - 200-300: + 1 unit - 301-400: + 2 units - >400: + 3 units  Please come back for a follow-up appointment in 2 months.   Please stop at the lab. - Patient is also interested in islet cell transplant - given her information about the transplant department at [facility] (Dr. Roxy Horseman) - they do take  Medicaid  - given a new sugar log - check at least 4x a day >> bring log at next visit - we stopped lisinopril because of low blood pressure at last visit >> will need to check ACR at next visit - TSH a little high at last visit >> will recheck this visit  - We also discussed about her menstrual cycles, which have stopped, and I believe that this is because of her weight loss >> she needs to start gaining the lost weight back  - return in  2 months with her sugar log, approximately 1 monafter her pump start  - time spent with the patient: 40 min, of which >50% was spent in reviewing her DM1 history since last visit, previous labs, discussing her hypo- and hyper-glycemic episodes, reviewing her insulin doses, and developing a plan to avoid hypo- an hyper-glycemia. She and her mother has several Qs which I addressed.  Office Visit on 05/09/2014  Component Date Value Ref Range Status  . Hgb A1c MFr Bld 05/09/2014 11.8* 4.6 - 6.5 % Final   Glycemic Control Guidelines for People with Diabetes:Non Diabetic:  <6%Goal of Therapy: <7%Additional Action  Suggested:  >8%   . TSH 05/09/2014 2.63  0.35 - 4.50 uIU/mL Final  HbA1c still terrible. TSH normal.

## 2014-05-09 NOTE — Patient Instructions (Signed)
Please schedule an appt with Antonieta Iba  - for carb counting refresher.  Schedule an appt with Leonia Reader for pump start in 3-4 weeks.  Please continue: - Lantus 15 units in a.m. - mealtime Novolog: 1 unit for 1 carb choice (15g of carbs)  - Novolog sliding scale: - 200-300: + 1 unit - 301-400: + 2 units - >400: + 3 units  Please come back for a follow-up appointment in 2 months.   Please stop at the lab.

## 2014-05-10 LAB — HEMOGLOBIN A1C: Hgb A1c MFr Bld: 11.8 % — ABNORMAL HIGH (ref 4.6–6.5)

## 2014-05-10 LAB — TSH: TSH: 2.63 u[IU]/mL (ref 0.35–4.50)

## 2014-05-24 ENCOUNTER — Ambulatory Visit: Payer: Medicaid Other | Admitting: Dietician

## 2014-05-27 ENCOUNTER — Ambulatory Visit: Payer: Medicaid Other | Admitting: Nutrition

## 2014-05-28 ENCOUNTER — Encounter (HOSPITAL_COMMUNITY): Payer: Self-pay | Admitting: *Deleted

## 2014-05-28 ENCOUNTER — Other Ambulatory Visit (HOSPITAL_COMMUNITY): Payer: Self-pay

## 2014-05-28 ENCOUNTER — Inpatient Hospital Stay (HOSPITAL_COMMUNITY)
Admission: EM | Admit: 2014-05-28 | Discharge: 2014-05-31 | DRG: 371 | Disposition: A | Discharge status: Home / Self Care | Payer: Medicaid Other | Attending: Internal Medicine | Admitting: Internal Medicine

## 2014-05-28 DIAGNOSIS — R197 Diarrhea, unspecified: Secondary | ICD-10-CM

## 2014-05-28 DIAGNOSIS — E1043 Type 1 diabetes mellitus with diabetic autonomic (poly)neuropathy: Secondary | ICD-10-CM

## 2014-05-28 DIAGNOSIS — D649 Anemia, unspecified: Secondary | ICD-10-CM | POA: Diagnosis present

## 2014-05-28 DIAGNOSIS — A0472 Enterocolitis due to Clostridium difficile, not specified as recurrent: Secondary | ICD-10-CM

## 2014-05-28 DIAGNOSIS — E1010 Type 1 diabetes mellitus with ketoacidosis without coma: Secondary | ICD-10-CM | POA: Diagnosis not present

## 2014-05-28 DIAGNOSIS — Z681 Body mass index (BMI) 19 or less, adult: Secondary | ICD-10-CM

## 2014-05-28 DIAGNOSIS — A047 Enterocolitis due to Clostridium difficile: Principal | ICD-10-CM | POA: Diagnosis present

## 2014-05-28 DIAGNOSIS — Z91013 Allergy to seafood: Secondary | ICD-10-CM | POA: Diagnosis not present

## 2014-05-28 DIAGNOSIS — E871 Hypo-osmolality and hyponatremia: Secondary | ICD-10-CM | POA: Diagnosis present

## 2014-05-28 DIAGNOSIS — K3184 Gastroparesis: Secondary | ICD-10-CM | POA: Diagnosis not present

## 2014-05-28 DIAGNOSIS — R112 Nausea with vomiting, unspecified: Secondary | ICD-10-CM

## 2014-05-28 DIAGNOSIS — E1143 Type 2 diabetes mellitus with diabetic autonomic (poly)neuropathy: Secondary | ICD-10-CM

## 2014-05-28 DIAGNOSIS — N390 Urinary tract infection, site not specified: Secondary | ICD-10-CM

## 2014-05-28 DIAGNOSIS — E43 Unspecified severe protein-calorie malnutrition: Secondary | ICD-10-CM | POA: Diagnosis not present

## 2014-05-28 DIAGNOSIS — K529 Noninfective gastroenteritis and colitis, unspecified: Secondary | ICD-10-CM

## 2014-05-28 DIAGNOSIS — N189 Chronic kidney disease, unspecified: Secondary | ICD-10-CM | POA: Diagnosis present

## 2014-05-28 DIAGNOSIS — IMO0002 Reserved for concepts with insufficient information to code with codable children: Secondary | ICD-10-CM

## 2014-05-28 DIAGNOSIS — Z91040 Latex allergy status: Secondary | ICD-10-CM

## 2014-05-28 DIAGNOSIS — E860 Dehydration: Secondary | ICD-10-CM

## 2014-05-28 DIAGNOSIS — E039 Hypothyroidism, unspecified: Secondary | ICD-10-CM | POA: Diagnosis present

## 2014-05-28 DIAGNOSIS — Z885 Allergy status to narcotic agent status: Secondary | ICD-10-CM | POA: Diagnosis not present

## 2014-05-28 DIAGNOSIS — K219 Gastro-esophageal reflux disease without esophagitis: Secondary | ICD-10-CM

## 2014-05-28 DIAGNOSIS — Z87891 Personal history of nicotine dependence: Secondary | ICD-10-CM | POA: Diagnosis not present

## 2014-05-28 DIAGNOSIS — E079 Disorder of thyroid, unspecified: Secondary | ICD-10-CM

## 2014-05-28 DIAGNOSIS — R739 Hyperglycemia, unspecified: Secondary | ICD-10-CM

## 2014-05-28 DIAGNOSIS — Z794 Long term (current) use of insulin: Secondary | ICD-10-CM | POA: Diagnosis not present

## 2014-05-28 DIAGNOSIS — E1011 Type 1 diabetes mellitus with ketoacidosis with coma: Secondary | ICD-10-CM | POA: Diagnosis not present

## 2014-05-28 DIAGNOSIS — F410 Panic disorder [episodic paroxysmal anxiety] without agoraphobia: Secondary | ICD-10-CM | POA: Diagnosis present

## 2014-05-28 DIAGNOSIS — E1065 Type 1 diabetes mellitus with hyperglycemia: Secondary | ICD-10-CM

## 2014-05-28 LAB — CBG MONITORING, ED
GLUCOSE-CAPILLARY: 469 mg/dL — AB (ref 70–99)
GLUCOSE-CAPILLARY: 409 mg/dL — AB (ref 70–99)
GLUCOSE-CAPILLARY: 206 mg/dL — AB (ref 70–99)
GLUCOSE-CAPILLARY: 164 mg/dL — AB (ref 70–99)
Glucose-Capillary: 539 mg/dL — ABNORMAL HIGH (ref 70–99)
Glucose-Capillary: 331 mg/dL — ABNORMAL HIGH (ref 70–99)
Glucose-Capillary: 197 mg/dL — ABNORMAL HIGH (ref 70–99)
Glucose-Capillary: 124 mg/dL — ABNORMAL HIGH (ref 70–99)
Glucose-Capillary: 110 mg/dL — ABNORMAL HIGH (ref 70–99)
Glucose-Capillary: 135 mg/dL — ABNORMAL HIGH (ref 70–99)
Glucose-Capillary: 160 mg/dL — ABNORMAL HIGH (ref 70–99)

## 2014-05-28 LAB — BASIC METABOLIC PANEL
ANION GAP: 12 (ref 5–15)
Anion gap: 24 — ABNORMAL HIGH (ref 5–15)
Anion gap: 7 (ref 5–15)
BUN: 16 mg/dL (ref 6–23)
BUN: 13 mg/dL (ref 6–23)
BUN: 11 mg/dL (ref 6–23)
CALCIUM: 8.4 mg/dL (ref 8.4–10.5)
CHLORIDE: 91 mmol/L — AB (ref 96–112)
CO2: 12 mmol/L — AB (ref 19–32)
CO2: 16 mmol/L — ABNORMAL LOW (ref 19–32)
CO2: 20 mmol/L (ref 19–32)
CREATININE: 0.74 mg/dL (ref 0.50–1.10)
Calcium: 9.9 mg/dL (ref 8.4–10.5)
Calcium: 8.2 mg/dL — ABNORMAL LOW (ref 8.4–10.5)
Chloride: 104 mmol/L (ref 96–112)
Chloride: 107 mmol/L (ref 96–112)
Creatinine, Ser: 0.99 mg/dL (ref 0.50–1.10)
Creatinine, Ser: 0.69 mg/dL (ref 0.50–1.10)
GFR calc Af Amer: 85 mL/min — ABNORMAL LOW (ref >90)
GFR calc Af Amer: >90 mL/min (ref >90)
GFR calc non Af Amer: 74 mL/min — ABNORMAL LOW (ref >90)
GFR calc non Af Amer: >90 mL/min (ref >90)
GFR calc non Af Amer: >90 mL/min (ref >90)
GLUCOSE: 598 mg/dL — AB (ref 70–99)
GLUCOSE: 124 mg/dL — AB (ref 70–99)
Glucose, Bld: 310 mg/dL — ABNORMAL HIGH (ref 70–99)
POTASSIUM: 4 mmol/L (ref 3.5–5.1)
POTASSIUM: 3.8 mmol/L (ref 3.5–5.1)
Potassium: 5 mmol/L (ref 3.5–5.1)
SODIUM: 134 mmol/L — AB (ref 135–145)
Sodium: 127 mmol/L — ABNORMAL LOW (ref 135–145)
Sodium: 132 mmol/L — ABNORMAL LOW (ref 135–145)

## 2014-05-28 LAB — CBC WITH DIFFERENTIAL/PLATELET
Basophils Absolute: 0 10*3/uL (ref 0.0–0.1)
Basophils Relative: 0 % (ref 0–1)
Eosinophils Absolute: 0 10*3/uL (ref 0.0–0.7)
Eosinophils Relative: 0 % (ref 0–5)
HEMATOCRIT: 44 % (ref 36.0–46.0)
HEMOGLOBIN: 14.9 g/dL (ref 12.0–15.0)
LYMPHS ABS: 1.1 10*3/uL (ref 0.7–4.0)
LYMPHS PCT: 11 % — AB (ref 12–46)
MCH: 31.6 pg (ref 26.0–34.0)
MCHC: 33.9 g/dL (ref 30.0–36.0)
MCV: 93.4 fL (ref 78.0–100.0)
MONOS PCT: 4 % (ref 3–12)
Monocytes Absolute: 0.4 10*3/uL (ref 0.1–1.0)
NEUTROS ABS: 8.7 10*3/uL — AB (ref 1.7–7.7)
Neutrophils Relative %: 85 % — ABNORMAL HIGH (ref 43–77)
Platelets: 276 10*3/uL (ref 150–400)
RBC: 4.71 MIL/uL (ref 3.87–5.11)
RDW: 12.6 % (ref 11.5–15.5)
WBC: 10.1 10*3/uL (ref 4.0–10.5)

## 2014-05-28 LAB — BLOOD GAS, VENOUS
ACID-BASE DEFICIT: 12.8 mmol/L — AB (ref 0.0–2.0)
Bicarbonate: 11 mEq/L — ABNORMAL LOW (ref 20.0–24.0)
FIO2: 0.21 %
O2 SAT: 70.1 %
Patient temperature: 98.6
TCO2: 10.1 mmol/L (ref 0–100)
pCO2, Ven: 20.7 mmHg — ABNORMAL LOW (ref 45.0–50.0)
pH, Ven: 7.346 — ABNORMAL HIGH (ref 7.250–7.300)
pO2, Ven: 38.5 mmHg (ref 30.0–45.0)

## 2014-05-28 LAB — URINALYSIS, ROUTINE W REFLEX MICROSCOPIC
Bilirubin Urine: NEGATIVE
Glucose, UA: >1000 mg/dL — AB
Hgb urine dipstick: NEGATIVE
Leukocytes, UA: NEGATIVE
Nitrite: NEGATIVE
Protein, ur: NEGATIVE mg/dL
Specific Gravity, Urine: 1.029 (ref 1.005–1.030)
Urobilinogen, UA: 0.2 mg/dL (ref 0.0–1.0)
pH: 5 (ref 5.0–8.0)

## 2014-05-28 LAB — URINE MICROSCOPIC-ADD ON

## 2014-05-28 LAB — PREGNANCY, URINE: Preg Test, Ur: NEGATIVE

## 2014-05-28 LAB — LIPASE, BLOOD: Lipase: 12 U/L (ref 11–59)

## 2014-05-28 MED ORDER — SODIUM CHLORIDE 0.9 % IV SOLN: INTRAVENOUS | Status: DC

## 2014-05-28 MED ORDER — DEXTROSE-NACL 5-0.9 % IV SOLN: INTRAVENOUS | Status: DC

## 2014-05-28 MED ORDER — SODIUM CHLORIDE 0.9 % IV SOLN: INTRAVENOUS | Status: AC

## 2014-05-28 MED ORDER — AMITRIPTYLINE HCL 25 MG PO TABS
100.0000 mg | ORAL_TABLET | Freq: Every day | ORAL | Status: DC
  Administered 2014-05-28 – 2014-05-30 (×3): 100 mg via ORAL
  Filled 2014-05-28 (×3): qty 4

## 2014-05-28 MED ORDER — ONDANSETRON HCL 4 MG/2ML IJ SOLN
4.0000 mg | Freq: Once | INTRAMUSCULAR | Status: AC
  Administered 2014-05-28: 4 mg via INTRAVENOUS
  Filled 2014-05-28: qty 2

## 2014-05-28 MED ORDER — SODIUM CHLORIDE 0.9 % IV BOLUS (SEPSIS)
1000.0000 mL | Freq: Once | INTRAVENOUS | Status: AC
  Administered 2014-05-28: 1000 mL via INTRAVENOUS

## 2014-05-28 MED ORDER — OXYCODONE-ACETAMINOPHEN 5-325 MG PO TABS
1.0000 | ORAL_TABLET | Freq: Three times a day (TID) | ORAL | Status: DC | PRN
  Administered 2014-05-29 (×3): 1 via ORAL
  Filled 2014-05-28 (×3): qty 1

## 2014-05-28 MED ORDER — ENOXAPARIN SODIUM 40 MG/0.4ML ~~LOC~~ SOLN
40.0000 mg | SUBCUTANEOUS | Status: DC
  Administered 2014-05-28: 40 mg via SUBCUTANEOUS
  Filled 2014-05-28: qty 0.4

## 2014-05-28 MED ORDER — SODIUM CHLORIDE 0.9 % IV SOLN
INTRAVENOUS | Status: DC
  Administered 2014-05-28: 15:00:00 via INTRAVENOUS

## 2014-05-28 MED ORDER — GABAPENTIN 100 MG PO CAPS
200.0000 mg | ORAL_CAPSULE | Freq: Every day | ORAL | Status: DC
  Administered 2014-05-28 – 2014-05-30 (×3): 200 mg via ORAL
  Filled 2014-05-28 (×3): qty 2

## 2014-05-28 MED ORDER — OXYCODONE-ACETAMINOPHEN 10-325 MG PO TABS: 1.0000 | ORAL_TABLET | Freq: Three times a day (TID) | ORAL | Status: DC | PRN

## 2014-05-28 MED ORDER — TRAMADOL HCL 50 MG PO TABS: 50.0000 mg | ORAL_TABLET | Freq: Two times a day (BID) | ORAL | Status: DC | PRN

## 2014-05-28 MED ORDER — SODIUM CHLORIDE 0.9 % IV SOLN
INTRAVENOUS | Status: DC
  Filled 2014-05-28: qty 2.5

## 2014-05-28 MED ORDER — OXYCODONE HCL 5 MG PO TABS
5.0000 mg | ORAL_TABLET | Freq: Three times a day (TID) | ORAL | Status: DC | PRN
  Administered 2014-05-28 – 2014-05-29 (×4): 5 mg via ORAL
  Filled 2014-05-28 (×4): qty 1

## 2014-05-28 MED ORDER — HYDROMORPHONE HCL 2 MG/ML IJ SOLN
1.0000 mg | INTRAMUSCULAR | Status: DC | PRN
  Administered 2014-05-28 – 2014-05-30 (×9): 1 mg via INTRAVENOUS
  Filled 2014-05-28 (×9): qty 1

## 2014-05-28 MED ORDER — DEXTROSE-NACL 5-0.45 % IV SOLN
INTRAVENOUS | Status: DC
  Administered 2014-05-28: 16:00:00 via INTRAVENOUS

## 2014-05-28 MED ORDER — POTASSIUM CHLORIDE CRYS ER 20 MEQ PO TBCR
40.0000 meq | EXTENDED_RELEASE_TABLET | Freq: Once | ORAL | Status: AC
  Administered 2014-05-28: 40 meq via ORAL
  Filled 2014-05-28: qty 2

## 2014-05-28 MED ORDER — SODIUM CHLORIDE 0.9 % IV SOLN
INTRAVENOUS | Status: DC
  Administered 2014-05-28: 4.1 [IU]/h via INTRAVENOUS
  Filled 2014-05-28: qty 2.5

## 2014-05-28 MED ORDER — METOCLOPRAMIDE HCL 5 MG/ML IJ SOLN
10.0000 mg | Freq: Three times a day (TID) | INTRAMUSCULAR | Status: DC
  Administered 2014-05-28 – 2014-05-31 (×10): 10 mg via INTRAVENOUS
  Filled 2014-05-28 (×10): qty 2

## 2014-05-28 MED ORDER — DICYCLOMINE HCL 10 MG PO CAPS
10.0000 mg | ORAL_CAPSULE | Freq: Three times a day (TID) | ORAL | Status: DC
  Administered 2014-05-28 – 2014-05-31 (×11): 10 mg via ORAL
  Filled 2014-05-28 (×13): qty 1

## 2014-05-28 MED ORDER — HYDROMORPHONE HCL 1 MG/ML IJ SOLN
1.0000 mg | Freq: Once | INTRAMUSCULAR | Status: AC
  Administered 2014-05-30: 1 mg via INTRAVENOUS
  Filled 2014-05-28 (×2): qty 1

## 2014-05-28 NOTE — H&P (Addendum)
[facility] History and Physical  [name] CHE:[id number] DOB: 10-31-1980 DOA: 05/28/2014  Referring physician: Dr Eddie Dibbles.  PCP: [name], MD   Chief Complaint: Vomiting, diarrhea.   HPI: [name] is a 34 y.o. female with PMH significant for Diabetes type 1, Diabetes neuropathy, DM gastroparesis, recurrent admission for DKA, who presents complaining of nausea, vomiting 5 episodes. Also relates watery stool more than 10 episodes that started day prior to admission, denies recent antibiotics use. She is also complaining of cramping abdominal pain./ She has been using her 14 units of lantus. Denies fever, cough, dysuria, chest pain or dyspnea.    Review of Systems:  Negative, except as per HPI.   Past Medical History  Diagnosis Date  . Diabetes mellitus   . Thyroid disease     hypothyroidism associated with pregnancy  . Back pain   . Panic attack   . PONV (postoperative nausea and vomiting)   . Anxiety   . GERD (gastroesophageal reflux disease)   . DM gastroparesis   . Chronic kidney disease   . Anemia   . Gastroparesis    Past Surgical History  Procedure Laterality Date  . Laparoscopy      age 71  . Back surgery      age 30  . Tooth extraction    . Root canal  10/10/12   Social History:  reports that she quit smoking about 14 years ago. She has quit using smokeless tobacco. She reports that she drinks alcohol. She reports that she does not use illicit drugs.  Allergies  Allergen Reactions  . Scallops [Shellfish Allergy] Anaphylaxis  . Morphine And Related Itching  . Latex Rash    Family History  Problem Relation Age of Onset  . Diabetes Maternal Grandmother   . Cancer Maternal Grandfather     Small cell lung cancer  . Cancer Maternal Grandmother     Leukemia    Prior to Admission medications   Medication Sig Start Date End Date Taking? Authorizing Provider  acetaminophen (TYLENOL) 500 MG tablet Take 500 mg by mouth every 6 (six) hours as  needed for moderate pain or headache.   Yes Historical Provider, MD  amitriptyline (ELAVIL) 50 MG tablet Take 100 mg by mouth at bedtime.    Yes Historical Provider, MD  bismuth subsalicylate (PEPTO BISMOL) 262 MG/15ML suspension Take 30 mLs by mouth every 6 (six) hours as needed for indigestion or diarrhea or loose stools.   Yes Historical Provider, MD  clotrimazole-betamethasone (LOTRISONE) cream Apply 1 application topically daily as needed (rash).   Yes Historical Provider, MD  Cyanocobalamin (VITAMIN B-12 PO) Take 5,000 tablets by mouth 2 (two) times daily.    Yes Historical Provider, MD  dicyclomine (BENTYL) 10 MG capsule Take 10 mg by mouth 4 (four) times daily -  before meals and at bedtime.   Yes Historical Provider, MD  famotidine (PEPCID) 20 MG tablet Take 1 tablet (20 mg total) by mouth 2 (two) times daily. Patient taking differently: Take 20 mg by mouth 2 (two) times daily as needed for heartburn.  11/05/13  Yes Nishant Dhungel, MD  gabapentin (NEURONTIN) 100 MG capsule Take 200 mg by mouth at bedtime.   Yes Historical Provider, MD  glucagon (GLUCAGON EMERGENCY) 1 MG injection Inject 1 mg into the muscle once as needed (severe hypoglycemia). 02/19/14  Yes Philemon Kingdom, MD  insulin aspart (NOVOLOG) 100 UNIT/ML injection Inject 1-3 Units into the skin 3 (three) times daily with meals. CBG <  70: implement hypoglycemia protocol CBG > 400: call MD   CBG 70 - 120: 0 units CBG 121 - 150: 3 units CBG 151 - 200: 4 units CBG 201 - 250: 7 units CBG 251 - 300: 11 units CBG 301 - 350: 15 units CBG 351 - 400: 20 units CBG > 400: call MD 02/01/13  Yes Donita Brooks, NP  insulin glargine (LANTUS) 100 UNIT/ML injection Inject 0.15 mLs (15 Units total) into the skin every morning. Patient taking differently: Inject 14 Units into the skin every morning.  02/19/14  Yes Philemon Kingdom, MD  Multiple Vitamin (MULTIVITAMIN WITH MINERALS) TABS Take 1 tablet by mouth every morning. Diabetic support  vitamin   Yes Historical Provider, MD  ondansetron (ZOFRAN ODT) 4 MG disintegrating tablet Take 1 tablet (4 mg total) by mouth every 8 (eight) hours as needed for nausea or vomiting. 04/01/14  Yes Velvet Bathe, MD  oxyCODONE-acetaminophen (PERCOCET) 10-325 MG per tablet Take 1 tablet by mouth every 8 (eight) hours as needed for pain.   Yes Historical Provider, MD  pantoprazole (PROTONIX) 40 MG tablet Take 1 tablet (40 mg total) by mouth daily. 02/04/14  Yes Reyne Dumas, MD  cephALEXin (KEFLEX) 500 MG capsule Take 1 capsule (500 mg total) by mouth 4 (four) times daily. Patient not taking: Reported on 05/28/2014 04/20/14   Dorie Rank, MD  metoCLOPramide (REGLAN) 10 MG tablet Take 1 tablet (10 mg total) by mouth 4 (four) times daily -  before meals and at bedtime. Patient not taking: Reported on 05/09/2014 04/20/14   Dorie Rank, MD  traMADol (ULTRAM) 50 MG tablet Take 1 tablet (50 mg total) by mouth every 12 (twelve) hours as needed for moderate pain. Patient not taking: Reported on 05/28/2014 04/20/14   Dorie Rank, MD   Physical Exam: Filed Vitals:   05/28/14 1415 05/28/14 1430 05/28/14 1437 05/28/14 1445  BP:   98/63   Pulse: 114 116 104 112  Temp:   98.9 F (37.2 C)   TempSrc:   Oral   Resp: 21 25 19 24   Height:      Weight:      SpO2: 100% 100% 100% 100%    Wt Readings from Last 3 Encounters:  05/28/14 43.092 kg (95 lb)  05/09/14 45.36 kg (100 lb)  04/02/14 49.896 kg (110 lb)    General:  Appears calm and comfortable Eyes: PERRL, normal lids, irises & conjunctiva ENT: grossly normal hearing, lips & tongue Neck: no LAD, masses or thyromegaly Cardiovascular: RRR, no m/r/g. No LE edema. Telemetry: SR, no arrhythmias  Respiratory: CTA bilaterally, no w/r/r. Normal respiratory effort. Abdomen: soft, mild tenderness, no rigidity Skin: no rash or induration seen on limited exam Musculoskeletal: grossly normal tone BUE/BLE Psychiatric: grossly normal mood and affect, speech fluent and  appropriate Neurologic: grossly non-focal.          Labs on Admission:  Basic Metabolic Panel:  Recent Labs Lab 05/28/14 1203  NA 127*  K 5.0  CL 91*  CO2 12*  GLUCOSE 598*  BUN 16  CREATININE 0.99  CALCIUM 9.9   Liver Function Tests: No results for input(s): AST, ALT, ALKPHOS, BILITOT, PROT, ALBUMIN in the last 168 hours.  Recent Labs Lab 05/28/14 1203  LIPASE 12   No results for input(s): AMMONIA in the last 168 hours. CBC:  Recent Labs Lab 05/28/14 1203  WBC 10.1  NEUTROABS 8.7*  HGB 14.9  HCT 44.0  MCV 93.4  PLT 276   Cardiac Enzymes: No  results for input(s): CKTOTAL, CKMB, CKMBINDEX, TROPONINI in the last 168 hours.  BNP (last 3 results) No results for input(s): BNP in the last 8760 hours.  ProBNP (last 3 results) No results for input(s): PROBNP in the last 8760 hours.  CBG:  Recent Labs Lab 05/28/14 1140 05/28/14 1305 05/28/14 1411 05/28/14 1458  GLUCAP 539* 469* 409* 331*    Radiological Exams on Admission: No results found.  VHS:[id number].   Assessment/Plan Active Problems:   DKA, type 1   Diabetic gastroparesis associated with type 1 diabetes mellitus   DKA (diabetic ketoacidoses)  1-DKA, type 1 Diabetes;  Presents with CBG at 598, gap 24, bicarb at 12, ketone in urine.  Probably exacerbated by gastroenteritis, viral infection.  IV fluids, Insulin Gtt.  B-met every 4 hours, cbg every hour.  Transition to lantus when gap and bicarb above 20.  Change IV fluids to D 5 ND when blood sugar less than 250/   2-Nausea, vomiting diarrhea;  Probably gastroenteritis, Vs gastroparesis/ vs IBS.  IV fluids, IV Reglan.  Check for C diff.   3-Tachycardia; secondary to decrease volume. IV fluids.   4-Hyponatremia; pseudohyponatremia secondary to hyperglycemia.   Code Status: Full Code.  DVT Prophylaxis:lovenox.  Family Communication: Care discussed with patient.  Disposition Plan: expect 3 to 4 days inpatient.   Time spent: 75  minutes.   Niel Hummer A [facility] Pager [phone]

## 2014-05-28 NOTE — Progress Notes (Signed)
EDCM spoke to patient at bedside.  Patient confirms she has Medicaid insurance.  Patient reports her pcp is located at the [facility] Dr. Cheri Rous.  Patient reports her endocrinologist is Dr. Letta Median.  EDCM noted patient to have visited the ED four times within the last six months.  Patient reports she is taking her diabetic medication as prescribed and has no difficulty affording her insulin.  Patient reports the last time she saw her endocrinologist was two weeks ago.  She reports she sees her endocrinologist at least once a month.  Patient reports she used to be on an insulin pump, and was planning to get back on it.  Patient reports she is a brittle diabetic.  No further EDCM needs at this time.

## 2014-05-28 NOTE — ED Notes (Signed)
Pt reports hyperglycemia, n/v/d, abd pain today. Sts she is a brittle type 1 diabetic and feels that she is progressing towards DKA. CBG 539 in triage.

## 2014-05-28 NOTE — ED Notes (Signed)
Charge spoke with hospitalist [place], Bicard level still off, pt still to go to stepdown. Charge informed there are no beds at this time. Plan is to have night hospitalist reassess pt and see if at that time pt could go to a different floor.

## 2014-05-28 NOTE — Progress Notes (Signed)
Inpatient Diabetes Program Recommendations  AACE/ADA: New Consensus Statement on Inpatient Glycemic Control (2013)  Target Ranges:  Prepandial:   less than 140 mg/dL      Peak postprandial:   less than 180 mg/dL (1-2 hours)      Critically ill patients:  140 - 180 mg/dL     Results for [name], [name] (MRN [id number]) as of 05/28/2014 13:23  Ref. Range 05/28/2014 12:03  Sodium Latest Range: 137-147 mmol/L 127 (L)  Potassium Latest Range: 3.5-5.1 mmol/L 5.0  Chloride Latest Range: 96-112 mmol/L 91 (L)  CO2 Latest Range: 19-32 mmol/L 12 (L)  BUN Latest Range: 4-21 mg/dL 16  Creatinine Latest Range: 0.50-1.10 mg/dL 0.99  Calcium Latest Range: 8.4-10.5 mg/dL 9.9  GFR calc non Af Amer Latest Range: >90 mL/min 74 (L)  GFR calc Af Amer Latest Range: >90 mL/min 85 (L)  Glucose Latest Units: mg/dL 598 (HH)  Anion gap Latest Range: 5-15  24 (H)    Admit with: DKA  History: Type 1 DM, Frequent admissions for DKA, Gastroparesis  Home DM Meds: Lantus 14 units QAM        Novolog 1 unit for every 15 grams of carbohydrates consumed        Novolog SSI per the following scale:        +1 unit- 200-300 mg/dl        +2 units- 301-400 mg/dl        +3 units- >400 mg/dl   Current DM Orders: IV Insulin drip per GlucoStablizer    **Per chart Review, note that patient saw her Endocrinologist (Dr. Philemon Kingdom with [facility]) on 05/09/14.  During that visit, patient's home insulin regimen was adjusted to the above listed insulins.    **Note that patient used to use an insulin pump but had frequent visits to the hospital for DKA and her pump was discontinued by her Endocrinologist.  **During her visit with Dr. Cruzita Lederer on 05/09/14, patient was re-evaluated for insulin pump.  Per records, patient needs to complete a carbohydrate counting course with the CDE at the [place] and DM management center before she can restart her insulin pump.  Patient has an appointment for a  carbohydrate class on 06/07/14 and then will see Leonia Reader, CDE for insulin pump restart on 06/12/14.  **Note patient is being hydrated in the ED and has been started on IV insulin drip.    MD- Once patient's labs have stabilized (anion gap closed, CO2 normal, glucose normal), please make sure patient receives her home dose of Lantus 1-2 hours before IV insulin drip stopped     Will follow Wyn Quaker RN, MSN, CDE Diabetes Coordinator Inpatient Diabetes Program Team Pager: [phone] (8a-5p)

## 2014-05-28 NOTE — ED Provider Notes (Signed)
CSN: [id number]     Arrival date & time 05/28/14  1117 History   First MD Initiated Contact with Patient 05/28/14 1139     Chief Complaint  Patient presents with  . Hyperglycemia  . Diarrhea  . Emesis  . Abdominal Pain     (Consider location/radiation/quality/duration/timing/severity/associated sxs/prior Treatment) HPI Comments: 34 year-old female with history of gastroparesis, uncontrolled diabetes, DKA, urine infections, Lantus and insulin sliding presents with recurrent vomiting and diarrhea since today. Feels similar to DKA and uncontrolled diabetes history. No sick contacts no recent antibiotics, no obvious infectious symptoms. Nothing improves her symptoms, increased thirst as expected.  Patient is a 34 y.o. female presenting with hyperglycemia, diarrhea, vomiting, and abdominal pain. The history is provided by the patient.  Hyperglycemia Associated symptoms: abdominal pain and vomiting   Associated symptoms: no chest pain, no dysuria, no fever and no shortness of breath   Diarrhea Associated symptoms: abdominal pain, arthralgias and vomiting   Associated symptoms: no chills, no fever and no headaches   Emesis Associated symptoms: abdominal pain, arthralgias and diarrhea (no recent antibiotics)   Associated symptoms: no chills and no headaches   Abdominal Pain Associated symptoms: diarrhea (no recent antibiotics) and vomiting   Associated symptoms: no chest pain, no chills, no dysuria, no fever and no shortness of breath     Past Medical History  Diagnosis Date  . Diabetes mellitus   . Thyroid disease     hypothyroidism associated with pregnancy  . Back pain   . Panic attack   . PONV (postoperative nausea and vomiting)   . Anxiety   . GERD (gastroesophageal reflux disease)   . DM gastroparesis   . Chronic kidney disease   . Anemia   . Gastroparesis    Past Surgical History  Procedure Laterality Date  . Laparoscopy      age 31  . Back surgery      age 3  .  Tooth extraction    . Root canal  10/10/12   Family History  Problem Relation Age of Onset  . Diabetes Maternal Grandmother   . Cancer Maternal Grandfather     Small cell lung cancer  . Cancer Maternal Grandmother     Leukemia   History  Substance Use Topics  . Smoking status: Former Smoker    Quit date: 04/19/2000  . Smokeless tobacco: Former Systems developer  . Alcohol Use: Yes     Comment: RARE SIPS OF WINE   OB History    No data available     Review of Systems  Constitutional: Positive for appetite change. Negative for fever and chills.  HENT: Negative for congestion.   Eyes: Negative for visual disturbance.  Respiratory: Negative for shortness of breath.   Cardiovascular: Negative for chest pain.  Gastrointestinal: Positive for vomiting, abdominal pain and diarrhea (no recent antibiotics).  Genitourinary: Negative for dysuria and flank pain.  Musculoskeletal: Positive for arthralgias. Negative for back pain, neck pain and neck stiffness.  Skin: Negative for rash.  Neurological: Negative for light-headedness and headaches.      Allergies  Scallops; Morphine and related; and Latex  Home Medications   Prior to Admission medications   Medication Sig Start Date End Date Taking? Authorizing Provider  acetaminophen (TYLENOL) 500 MG tablet Take 500 mg by mouth every 6 (six) hours as needed for moderate pain or headache.   Yes Historical Provider, MD  amitriptyline (ELAVIL) 50 MG tablet Take 100 mg by mouth at bedtime.    Yes  Historical Provider, MD  bismuth subsalicylate (PEPTO BISMOL) 262 MG/15ML suspension Take 30 mLs by mouth every 6 (six) hours as needed for indigestion or diarrhea or loose stools.   Yes Historical Provider, MD  clotrimazole-betamethasone (LOTRISONE) cream Apply 1 application topically daily as needed (rash).   Yes Historical Provider, MD  Cyanocobalamin (VITAMIN B-12 PO) Take 5,000 tablets by mouth 2 (two) times daily.    Yes Historical Provider, MD   dicyclomine (BENTYL) 10 MG capsule Take 10 mg by mouth 4 (four) times daily -  before meals and at bedtime.   Yes Historical Provider, MD  famotidine (PEPCID) 20 MG tablet Take 1 tablet (20 mg total) by mouth 2 (two) times daily. Patient taking differently: Take 20 mg by mouth 2 (two) times daily as needed for heartburn.  11/05/13  Yes Nishant Dhungel, MD  gabapentin (NEURONTIN) 100 MG capsule Take 200 mg by mouth at bedtime.   Yes Historical Provider, MD  glucagon (GLUCAGON EMERGENCY) 1 MG injection Inject 1 mg into the muscle once as needed (severe hypoglycemia). 02/19/14  Yes Philemon Kingdom, MD  insulin aspart (NOVOLOG) 100 UNIT/ML injection Inject 1-3 Units into the skin 3 (three) times daily with meals. CBG < 70: implement hypoglycemia protocol CBG > 400: call MD   CBG 70 - 120: 0 units CBG 121 - 150: 3 units CBG 151 - 200: 4 units CBG 201 - 250: 7 units CBG 251 - 300: 11 units CBG 301 - 350: 15 units CBG 351 - 400: 20 units CBG > 400: call MD 02/01/13  Yes Donita Brooks, NP  insulin glargine (LANTUS) 100 UNIT/ML injection Inject 0.15 mLs (15 Units total) into the skin every morning. Patient taking differently: Inject 14 Units into the skin every morning.  02/19/14  Yes Philemon Kingdom, MD  Multiple Vitamin (MULTIVITAMIN WITH MINERALS) TABS Take 1 tablet by mouth every morning. Diabetic support vitamin   Yes Historical Provider, MD  ondansetron (ZOFRAN ODT) 4 MG disintegrating tablet Take 1 tablet (4 mg total) by mouth every 8 (eight) hours as needed for nausea or vomiting. 04/01/14  Yes Velvet Bathe, MD  oxyCODONE-acetaminophen (PERCOCET) 10-325 MG per tablet Take 1 tablet by mouth every 8 (eight) hours as needed for pain.   Yes Historical Provider, MD  pantoprazole (PROTONIX) 40 MG tablet Take 1 tablet (40 mg total) by mouth daily. 02/04/14  Yes Reyne Dumas, MD  cephALEXin (KEFLEX) 500 MG capsule Take 1 capsule (500 mg total) by mouth 4 (four) times daily. Patient not taking:  Reported on 05/28/2014 04/20/14   Dorie Rank, MD  metoCLOPramide (REGLAN) 10 MG tablet Take 1 tablet (10 mg total) by mouth 4 (four) times daily -  before meals and at bedtime. Patient not taking: Reported on 05/09/2014 04/20/14   Dorie Rank, MD  traMADol (ULTRAM) 50 MG tablet Take 1 tablet (50 mg total) by mouth every 12 (twelve) hours as needed for moderate pain. Patient not taking: Reported on 05/28/2014 04/20/14   Dorie Rank, MD   BP 98/68 mmHg  Pulse 127  Temp(Src) 98.1 F (36.7 C) (Oral)  Resp 20  Ht 5\' 5"  (1.651 m)  Wt 95 lb (43.092 kg)  BMI 15.81 kg/m2  SpO2 100%  LMP 01/26/2014 Physical Exam  Constitutional: She is oriented to person, place, and time. She appears well-developed and well-nourished.  HENT:  Head: Normocephalic and atraumatic.  Dry mucous membranes  Eyes: Conjunctivae are normal. Right eye exhibits no discharge. Left eye exhibits no discharge.  Neck: Normal  range of motion. Neck supple. No tracheal deviation present.  Cardiovascular: Regular rhythm.  Tachycardia present.   Pulmonary/Chest: Effort normal and breath sounds normal.  Abdominal: Soft. She exhibits no distension. There is tenderness (epigastric tenderness). There is no guarding.  Musculoskeletal: She exhibits no edema.  Neurological: She is alert and oriented to person, place, and time.  Skin: Skin is warm. No rash noted.  Psychiatric: She has a normal mood and affect.  Nursing note and vitals reviewed.   ED Course  Procedures (including critical care time) CRITICAL CARE Performed by: Mariea Clonts   Total critical care time: 35 min  Critical care time was exclusive of separately billable procedures and treating other patients.  Critical care was necessary to treat or prevent imminent or life-threatening deterioration.  Critical care was time spent personally by me on the following activities: development of treatment plan with patient and/or surrogate as well as nursing, discussions with  consultants, evaluation of patient's response to treatment, examination of patient, obtaining history from patient or surrogate, ordering and performing treatments and interventions, ordering and review of laboratory studies, ordering and review of radiographic studies, pulse oximetry and re-evaluation of patient's condition.  Labs Review Labs Reviewed  BASIC METABOLIC PANEL - Abnormal; Notable for the following:    Sodium 127 (*)    Chloride 91 (*)    CO2 12 (*)    Glucose, Bld 598 (*)    GFR calc non Af Amer 74 (*)    GFR calc Af Amer 85 (*)    Anion gap 24 (*)    All other components within normal limits  CBC WITH DIFFERENTIAL/PLATELET - Abnormal; Notable for the following:    Neutrophils Relative % 85 (*)    Neutro Abs 8.7 (*)    Lymphocytes Relative 11 (*)    All other components within normal limits  BLOOD GAS, VENOUS - Abnormal; Notable for the following:    pH, Ven 7.346 (*)    pCO2, Ven 20.7 (*)    Bicarbonate 11.0 (*)    Acid-base deficit 12.8 (*)    All other components within normal limits  CBG MONITORING, ED - Abnormal; Notable for the following:    Glucose-Capillary 539 (*)    All other components within normal limits  LIPASE, BLOOD  URINALYSIS, ROUTINE W REFLEX MICROSCOPIC  PREGNANCY, URINE    Imaging Review No results found.   EKG Interpretation None      MDM   Final diagnoses:  Type 1 diabetes mellitus with ketoacidosis and without coma  Nausea vomiting and diarrhea  Dehydration  Hyponatremia   clinical concern for DKA versus uncontrolled diabetes, clinically dehydrated, IV fluids ordered nausea meds and pain medicines. Plan for insulin and admission.  Patient clinical and lab work consistent with DKA, insulin drip and IV fluids started. Repeat IV fluid bolus.  [facility] for admission for insulin drip further treatment  The patients results and plan were reviewed and discussed.   Any x-rays performed were personally reviewed by  myself.   Differential diagnosis were considered with the presenting HPI.  Medications  HYDROmorphone (DILAUDID) injection 1 mg (not administered)  insulin regular (NOVOLIN R,HUMULIN R) 250 Units in sodium chloride 0.9 % 250 mL (1 Units/mL) infusion (4.1 Units/hr Intravenous New Bag/Given 05/28/14 1307)  sodium chloride 0.9 % bolus 1,000 mL (not administered)  ondansetron (ZOFRAN) injection 4 mg (4 mg Intravenous Given 05/28/14 1200)  sodium chloride 0.9 % bolus 1,000 mL (0 mLs Intravenous Stopped 05/28/14 1300)  Filed Vitals:   05/28/14 1133 05/28/14 1200  BP: 98/68   Pulse: 127   Temp: 98.1 F (36.7 C)   TempSrc: Oral   Resp: 20   Height:  5\' 5"  (1.651 m)  Weight:  95 lb (43.092 kg)  SpO2: 100%     Final diagnoses:  Type 1 diabetes mellitus with ketoacidosis and without coma  Nausea vomiting and diarrhea  Dehydration  Hyponatremia    Admission/ observation were discussed with the admitting physician, patient and/or family and they are comfortable with the plan.    Elnora Morrison, MD 05/28/14 1430

## 2014-05-29 DIAGNOSIS — E1011 Type 1 diabetes mellitus with ketoacidosis with coma: Secondary | ICD-10-CM

## 2014-05-29 DIAGNOSIS — A047 Enterocolitis due to Clostridium difficile: Principal | ICD-10-CM

## 2014-05-29 LAB — GLUCOSE, CAPILLARY
GLUCOSE-CAPILLARY: 243 mg/dL — AB (ref 70–99)
GLUCOSE-CAPILLARY: 254 mg/dL — AB (ref 70–99)
GLUCOSE-CAPILLARY: 409 mg/dL — AB (ref 70–99)
Glucose-Capillary: 247 mg/dL — ABNORMAL HIGH (ref 70–99)
Glucose-Capillary: 179 mg/dL — ABNORMAL HIGH (ref 70–99)
Glucose-Capillary: 256 mg/dL — ABNORMAL HIGH (ref 70–99)
Glucose-Capillary: 255 mg/dL — ABNORMAL HIGH (ref 70–99)

## 2014-05-29 LAB — BASIC METABOLIC PANEL
Anion gap: 7 (ref 5–15)
BUN: 13 mg/dL (ref 6–23)
CO2: 21 mmol/L (ref 19–32)
Calcium: 8.2 mg/dL — ABNORMAL LOW (ref 8.4–10.5)
Chloride: 107 mmol/L (ref 96–112)
Creatinine, Ser: 0.67 mg/dL (ref 0.50–1.10)
GFR calc Af Amer: >90 mL/min (ref >90)
GFR calc non Af Amer: >90 mL/min (ref >90)
Glucose, Bld: 214 mg/dL — ABNORMAL HIGH (ref 70–99)
Potassium: 3.8 mmol/L (ref 3.5–5.1)
Sodium: 135 mmol/L (ref 135–145)

## 2014-05-29 LAB — GLUCOSE, RANDOM: GLUCOSE: 385 mg/dL — AB (ref 70–99)

## 2014-05-29 LAB — CLOSTRIDIUM DIFFICILE BY PCR: Toxigenic C. Difficile by PCR: POSITIVE — AB

## 2014-05-29 MED ORDER — METRONIDAZOLE 500 MG PO TABS
500.0000 mg | ORAL_TABLET | Freq: Three times a day (TID) | ORAL | Status: DC
  Administered 2014-05-29 – 2014-05-31 (×7): 500 mg via ORAL
  Filled 2014-05-29 (×7): qty 1

## 2014-05-29 MED ORDER — GLUCERNA SHAKE PO LIQD
237.0000 mL | Freq: Three times a day (TID) | ORAL | Status: DC
Start: 2014-05-29 — End: 2014-05-31
  Administered 2014-05-29 – 2014-05-30 (×4): 237 mL via ORAL
  Filled 2014-05-29 (×6): qty 237

## 2014-05-29 MED ORDER — INSULIN GLARGINE 100 UNIT/ML ~~LOC~~ SOLN
15.0000 [IU] | Freq: Every day | SUBCUTANEOUS | Status: DC
  Administered 2014-05-29 – 2014-05-30 (×3): 15 [IU] via SUBCUTANEOUS
  Filled 2014-05-29 (×4): qty 0.15

## 2014-05-29 MED ORDER — SACCHAROMYCES BOULARDII 250 MG PO CAPS
250.0000 mg | ORAL_CAPSULE | Freq: Two times a day (BID) | ORAL | Status: DC
  Administered 2014-05-29 – 2014-05-30 (×4): 250 mg via ORAL
  Filled 2014-05-29 (×4): qty 1

## 2014-05-29 MED ORDER — ENOXAPARIN SODIUM 30 MG/0.3ML ~~LOC~~ SOLN
30.0000 mg | SUBCUTANEOUS | Status: DC
  Administered 2014-05-29 – 2014-05-30 (×2): 30 mg via SUBCUTANEOUS
  Filled 2014-05-29 (×2): qty 0.3

## 2014-05-29 MED ORDER — SODIUM CHLORIDE 0.9 % IV SOLN
INTRAVENOUS | Status: DC
  Administered 2014-05-29 – 2014-05-31 (×6): via INTRAVENOUS

## 2014-05-29 MED ORDER — INSULIN ASPART 100 UNIT/ML ~~LOC~~ SOLN
0.0000 [IU] | Freq: Three times a day (TID) | SUBCUTANEOUS | Status: DC
  Administered 2014-05-29: 5 [IU] via SUBCUTANEOUS
  Administered 2014-05-29: 3 [IU] via SUBCUTANEOUS
  Administered 2014-05-29: 10 [IU] via SUBCUTANEOUS
  Administered 2014-05-30: 3 [IU] via SUBCUTANEOUS
  Administered 2014-05-30: 2 [IU] via SUBCUTANEOUS
  Administered 2014-05-30 – 2014-05-31 (×3): 1 [IU] via SUBCUTANEOUS

## 2014-05-29 MED ORDER — OXYCODONE HCL 5 MG PO TABS
5.0000 mg | ORAL_TABLET | ORAL | Status: DC | PRN
  Administered 2014-05-29 – 2014-05-30 (×3): 5 mg via ORAL
  Filled 2014-05-29 (×3): qty 1

## 2014-05-29 MED ORDER — INSULIN ASPART 100 UNIT/ML ~~LOC~~ SOLN
3.0000 [IU] | Freq: Three times a day (TID) | SUBCUTANEOUS | Status: DC
  Administered 2014-05-29 – 2014-05-30 (×3): 3 [IU] via SUBCUTANEOUS

## 2014-05-29 MED ORDER — OXYCODONE-ACETAMINOPHEN 5-325 MG PO TABS
1.0000 | ORAL_TABLET | ORAL | Status: DC | PRN
  Administered 2014-05-29 – 2014-05-31 (×8): 1 via ORAL
  Filled 2014-05-29 (×8): qty 1

## 2014-05-29 NOTE — Progress Notes (Signed)
Pt sleeping peacefully. Lavender eo on 2x2 gauze in cup at bedside. Lind Guest, RN

## 2014-05-29 NOTE — Progress Notes (Signed)
[facility]                                                                              Patient Demographics  [name], is a 34 y.o. female, DOB - 17-Feb-1981, KTG:[id number]  Admit date - 05/28/2014   Admitting Physician Elmarie Shiley, MD  Outpatient Primary MD for the patient is [facility], MD  LOS - 1   Chief Complaint  Patient presents with  . Hyperglycemia  . Diarrhea  . Emesis  . Abdominal Pain       Brief HPI   [name] is a 34 y.o. female with PMH significant for Diabetes type 1, Diabetes neuropathy, DM gastroparesis, recurrent admission for DKA, who presents complaining of nausea, vomiting 5 episodes. Also relates watery stool more than 10 episodes that started day prior to admission, denies recent antibiotics use. She also complained of cramping abdominal pain./ She has been using her 14 units of lantus. Denies fever, cough, dysuria, chest pain or dyspnea.     Assessment & Plan    Principal Problem:   DKA (diabetic ketoacidoses) - Gap closed, patient started on subcutaneous insulin - Still blood sugars elevated in 200s, placed on NovoLog meal coverage, continue Levemir, sliding scale insulin - Continue aggressive IV fluid hydration, until the diarrhea has resolved - Nutrition consult, diabetic coordinator consult placed  Active Problems:   Diabetic gastroparesis associated with type 1 diabetes mellitus - Continue IV fluids and Reglan    C. difficile colitis: History obtained from the patient in detail, she had strep UTI in a generally 2016, strep throat in February 2016, was on Keflex. Patient reports diarrhea going on for at least 2 months, has not been eating well, losing weight - C. difficile came back positive likely due to antibiotic use, first episode - Placed on IV fluids, metronidazole for 14 days, probiotic - If no significant improvement in next 24 hours, will obtain CT of the abdomen and pelvis to rule out any  megacolon  Hypotension: Likely due to dehydration, C. difficile colitis and DKA - Continue aggressive IV fluid hydration   Code Status: Full CODE STATUS  Family Communication: Discussed in detail with the patient, all imaging results, lab results explained to the patient, mother and father   Disposition Plan: Hopefully DC home in next 24-48 hours  Time Spent in minutes   25 minutes  Procedures  None  Consults   None  DVT Prophylaxis  Lovenox  Medications  Scheduled Meds: . amitriptyline  100 mg Oral QHS  . dicyclomine  10 mg Oral TID AC & HS  . enoxaparin (LOVENOX) injection  40 mg Subcutaneous Q24H  . gabapentin  200 mg Oral QHS  .  HYDROmorphone (DILAUDID) injection  1 mg Intravenous Once  . insulin aspart  0-9 Units Subcutaneous TID WC  . insulin aspart  3 Units Subcutaneous TID WC  . insulin glargine  15 Units Subcutaneous QHS  . metoCLOPramide (REGLAN) injection  10 mg Intravenous 3 times per day  . metroNIDAZOLE  500 mg Oral 3 times per day  . saccharomyces boulardii  250 mg Oral BID   Continuous  Infusions: . sodium chloride 125 mL/hr at 05/29/14 1214   PRN Meds:.HYDROmorphone, oxyCODONE-acetaminophen **AND** oxyCODONE, traMADol   Antibiotics   Anti-infectives    Start     Dose/Rate Route Frequency Ordered Stop   05/29/14 1400  metroNIDAZOLE (FLAGYL) tablet 500 mg     500 mg Oral 3 times per day 05/29/14 1022 06/12/14 1359        Subjective:   [name] was seen and examined today. Still having diarrhea. abd tenderness, no fevers or chills. No nausea or vomiting.  No acute events overnight.    Objective:   Blood pressure 85/54, pulse 86, temperature 98.4 F (36.9 C), temperature source Oral, resp. rate 17, height 5\' 5"  (1.651 m), weight 45 kg (99 lb 3.3 oz), last menstrual period 01/26/2014, SpO2 100 %.  Wt Readings from Last 3 Encounters:  05/28/14 45 kg (99 lb 3.3 oz)  05/09/14 45.36 kg (100 lb)  04/02/14 49.896 kg (110 lb)      Intake/Output Summary (Last 24 hours) at 05/29/14 1402 Last data filed at 05/29/14 0549  Gross per 24 hour  Intake 518.33 ml  Output    200 ml  Net 318.33 ml    Exam  General: Alert and oriented x 3, NAD  HEENT:  PERRLA, EOMI, Anicteic Sclera, mucous membranes moist.   Neck: Supple, no JVD, no masses  CVS: S1 S2 auscultated, no rubs, murmurs or gallops. Regular rate and rhythm.  Respiratory: Clear to auscultation bilaterally, no wheezing, rales or rhonchi  Abdomen: Soft, mild diffuse abd tenderness, nondistended, + bowel sounds  Ext: no cyanosis clubbing or edema  Neuro: AAOx3, Cr N's II- XII. Strength 5/5 upper and lower extremities bilaterally  Skin: No rashes  Psych: Normal affect and demeanor, alert and oriented x3    Data Review   Micro Results Recent Results (from the past 240 hour(s))  Clostridium Difficile by PCR     Status: Abnormal   Collection Time: 05/29/14  7:49 AM  Result Value Ref Range Status   C difficile by pcr POSITIVE (A) NEGATIVE Final    Comment: CRITICAL RESULT CALLED TO, READ BACK BY AND VERIFIED WITHDorothyann Gibbs RN AT 5830 05/29/14 MULLINS,T     Radiology Reports No results found.  CBC  Recent Labs Lab 05/28/14 1203  WBC 10.1  HGB 14.9  HCT 44.0  PLT 276  MCV 93.4  MCH 31.6  MCHC 33.9  RDW 12.6  LYMPHSABS 1.1  MONOABS 0.4  EOSABS 0.0  BASOSABS 0.0    Chemistries   Recent Labs Lab 05/28/14 1203 05/28/14 1517 05/28/14 1922 05/29/14 0423  NA 127* 132* 134* 135  K 5.0 4.0 3.8 3.8  CL 91* 104 107 107  CO2 12* 16* 20 21  GLUCOSE 598* 310* 124* 214*  BUN 16 13 11 13   CREATININE 0.99 0.74 0.69 0.67  CALCIUM 9.9 8.2* 8.4 8.2*   ------------------------------------------------------------------------------------------------------------------ estimated creatinine clearance is 70.4 mL/min (by C-G formula based on Cr of  0.67). ------------------------------------------------------------------------------------------------------------------ No results for input(s): HGBA1C in the last 72 hours. ------------------------------------------------------------------------------------------------------------------ No results for input(s): CHOL, HDL, LDLCALC, TRIG, CHOLHDL, LDLDIRECT in the last 72 hours. ------------------------------------------------------------------------------------------------------------------ No results for input(s): TSH, T4TOTAL, T3FREE, THYROIDAB in the last 72 hours.  Invalid input(s): FREET3 ------------------------------------------------------------------------------------------------------------------ No results for input(s): VITAMINB12, FOLATE, FERRITIN, TIBC, IRON, RETICCTPCT in the last 72 hours.  Coagulation profile No results for input(s): INR, PROTIME in the last 168 hours.  No results for input(s): DDIMER in the last 72 hours.  Cardiac Enzymes  No results for input(s): CKMB, TROPONINI, MYOGLOBIN in the last 168 hours.  Invalid input(s): CK ------------------------------------------------------------------------------------------------------------------ Invalid input(s): [place]  05/29/14 0034 05/29/14 0144 05/29/14 0318 05/29/14 0604 05/29/14 0758 05/29/14 1135  GLUCAP 254* 247* 179* 256* 51* 9*     [name] M.D. [facility] 05/29/2014, 2:02 PM  Pager: [phone]   Between 7am to 7pm - call Pager - [phone]  After 7pm go to [email or web address] - password [email or web address]  Call night coverage person covering after 7pm

## 2014-05-29 NOTE — Progress Notes (Signed)
CRITICAL VALUE ALERT  Critical value received:  CDiff POSITIVE  Date of notification:  05/29/2014  Time of notification:  1000  Critical value read back:Yes  Nurse who received alert:  Laurance Flatten RN  MD notified (1st page):  Dr. Tana Coast  Time of first page:  1025  MD notified (2nd page):  Time of second page:  Responding MD:  Dr. Tana Coast  Time MD responded:  [phone]

## 2014-05-29 NOTE — Progress Notes (Signed)
Patient given 2 drops lavender eo on 2x2 gauze in med cup for inhalation therapy per request for relaxation. Lind Guest, RN

## 2014-05-29 NOTE — Progress Notes (Signed)
INITIAL NUTRITION ASSESSMENT  DOCUMENTATION CODES Per approved criteria  -Severe malnutrition in the context of chronic illness -Underweight   INTERVENTION: Glucerna Shake po TID, each supplement provides 220 kcal and 10 grams of protein  Educated pt on gastroparesis diet  NUTRITION DIAGNOSIS: Malnutrition related to chronic illness as evidenced by 27% weight loss x 5 months and intake </= 75% of her needs in >/= 1 month.   Goal: Pt to meet >/= 90% of their estimated nutrition needs  Dietary compliance  Monitor:  PO intake, supplement acceptance, weight trends, labs  Reason for Assessment: MD consult for diet education   ASSESSMENT: Pt with several recent admissions for DKA. She reports that she has very poor control of her diabetes and feels that she is very brittle. She has had a pump in the past but does not currently have one.  She is being evaluated for a pump by her physician and plans to review her blood sugars with her CDE at [facility] this month.  Per pt she has lost a lot of weight since November 2015. She went from 128 lb - 135 lb to 95-99 lb. She feels that this has been related to nausea, diarrhea, and high blood sugars. Pt is newly dx with c.diff.  She mostly eats soups, sandwiches, and smoothies/yogurt. Her intake has been limited at home due to the nausea and diarrhea. Breakfast: chicken noodle soup, small smoothie, Lunch: sandwich, Dinner: chicken  Pt very open to education.  We reviewed gastroparesis diet, discussed foods to avoid and foods that are better choices. Expect good compliance. Handouts with contact info provided.   MD in to visit pt, unable to complete the exam.   Height: Ht Readings from Last 1 Encounters:  05/28/14 5\' 5"  (1.651 m)    Weight: Wt Readings from Last 1 Encounters:  05/28/14 99 lb 3.3 oz (45 kg)    Ideal Body Weight: 61.8 kg  % Ideal Body Weight: 73%  Wt Readings from Last 10 Encounters:  05/28/14 99 lb 3.3 oz (45 kg)   05/09/14 100 lb (45.36 kg)  04/02/14 110 lb (49.896 kg)  03/30/14 101 lb 13.6 oz (46.2 kg)  03/25/14 106 lb (48.081 kg)  03/05/14 122 lb 9.2 oz (55.6 kg)  02/19/14 109 lb (49.442 kg)  02/03/14 123 lb 3.8 oz (55.9 kg)  11/03/13 136 lb 7.4 oz (61.9 kg)  05/21/13 138 lb (62.596 kg)    Usual Body Weight: 135 lb  % Usual Body Weight: 73%  BMI:  Body mass index is 16.51 kg/(m^2).  Estimated Nutritional Needs: Kcal: 1400-1600 Protein: 70-80 grams Fluid: >1.5 L/day  Skin: no issues noted  Diet Order: Diet Carb Modified Fluid consistency:: Thin; Room service appropriate?: Yes  EDUCATION NEEDS: -Education needs addressed   Intake/Output Summary (Last 24 hours) at 05/29/14 1109 Last data filed at 05/29/14 0549  Gross per 24 hour  Intake 518.33 ml  Output    200 ml  Net 318.33 ml    Last BM: 4/6   Labs:   Recent Labs Lab 05/28/14 1517 05/28/14 1922 05/29/14 0423  NA 132* 134* 135  K 4.0 3.8 3.8  CL 104 107 107  CO2 16* 20 21  BUN 13 11 13   CREATININE 0.74 0.69 0.67  CALCIUM 8.2* 8.4 8.2*  GLUCOSE 310* 124* 214*    CBG (last 3)   Recent Labs  05/29/14 0318 05/29/14 0604 05/29/14 0758  GLUCAP 179* 256* 255*    Scheduled Meds: . amitriptyline  100 mg  Oral QHS  . dicyclomine  10 mg Oral TID AC & HS  . enoxaparin (LOVENOX) injection  40 mg Subcutaneous Q24H  . gabapentin  200 mg Oral QHS  .  HYDROmorphone (DILAUDID) injection  1 mg Intravenous Once  . insulin aspart  0-9 Units Subcutaneous TID WC  . insulin aspart  3 Units Subcutaneous TID WC  . insulin glargine  15 Units Subcutaneous QHS  . metoCLOPramide (REGLAN) injection  10 mg Intravenous 3 times per day  . metroNIDAZOLE  500 mg Oral 3 times per day  . saccharomyces boulardii  250 mg Oral BID    Continuous Infusions: . sodium chloride 100 mL/hr at 05/29/14 [place], [place], CNSC [phone] Pager [phone] After Hours Pager

## 2014-05-30 ENCOUNTER — Inpatient Hospital Stay (HOSPITAL_COMMUNITY): Payer: Medicaid Other

## 2014-05-30 ENCOUNTER — Encounter (HOSPITAL_COMMUNITY): Payer: Self-pay | Admitting: Radiology

## 2014-05-30 LAB — GLUCOSE, CAPILLARY
Glucose-Capillary: 186 mg/dL — ABNORMAL HIGH (ref 70–99)
Glucose-Capillary: 142 mg/dL — ABNORMAL HIGH (ref 70–99)
Glucose-Capillary: 207 mg/dL — ABNORMAL HIGH (ref 70–99)
Glucose-Capillary: 166 mg/dL — ABNORMAL HIGH (ref 70–99)
Glucose-Capillary: 282 mg/dL — ABNORMAL HIGH (ref 70–99)

## 2014-05-30 MED ORDER — INSULIN ASPART 100 UNIT/ML ~~LOC~~ SOLN
5.0000 [IU] | Freq: Three times a day (TID) | SUBCUTANEOUS | Status: DC
  Administered 2014-05-30 – 2014-05-31 (×4): 5 [IU] via SUBCUTANEOUS

## 2014-05-30 MED ORDER — IOHEXOL 300 MG/ML  SOLN
25.0000 mL | INTRAMUSCULAR | Status: AC
  Administered 2014-05-30 (×2): 25 mL via ORAL

## 2014-05-30 MED ORDER — HYDROMORPHONE HCL 1 MG/ML IJ SOLN
1.0000 mg | INTRAMUSCULAR | Status: DC | PRN
  Administered 2014-05-30 – 2014-05-31 (×7): 1 mg via INTRAVENOUS
  Filled 2014-05-30 (×6): qty 1

## 2014-05-30 MED ORDER — IOHEXOL 300 MG/ML  SOLN
80.0000 mL | Freq: Once | INTRAMUSCULAR | Status: AC | PRN
  Administered 2014-05-30: 80 mL via INTRAVENOUS

## 2014-05-30 MED ORDER — CHOLESTYRAMINE LIGHT 4 G PO PACK
4.0000 g | PACK | Freq: Once | ORAL | Status: AC
  Administered 2014-05-30: 4 g via ORAL
  Filled 2014-05-30: qty 1

## 2014-05-30 NOTE — Progress Notes (Signed)
[facility]                                                                              Patient Demographics  [name], is a 34 y.o. female, DOB - [birth year], RXY:[id number]  Admit date - 05/28/2014   Admitting Physician Elmarie Shiley, MD  Outpatient Primary MD for the patient is [facility], MD  LOS - 2   Chief Complaint  Patient presents with  . Hyperglycemia  . Diarrhea  . Emesis  . Abdominal Pain       Brief HPI   [name] is a 34 y.o. female with PMH significant for Diabetes type 1, Diabetes neuropathy, DM gastroparesis, recurrent admission for DKA, who presents complaining of nausea, vomiting 5 episodes. Also relates watery stool more than 10 episodes that started day prior to admission, denies recent antibiotics use. She also complained of cramping abdominal pain./ She has been using her 14 units of lantus. Denies fever, cough, dysuria, chest pain or dyspnea.     Assessment & Plan    Principal Problem:   DKA (diabetic ketoacidoses) - Gap closed, patient started on subcutaneous insulin - Still blood sugars uncontrolled, increased NovoLog meal coverage, continue Levemir, sliding scale insulin - Continue aggressive IV fluid hydration, until the diarrhea has resolved - Nutrition consult, diabetic coordinator consult placed  Active Problems:   Diabetic gastroparesis associated with type 1 diabetes mellitus - Continue IV fluids and Reglan    C. difficile colitis: History obtained from the patient in detail, she had strep UTI in a generally 2016, strep throat in February 2016, was on Keflex. Patient reports diarrhea going on for at least 2 months, has not been eating well, losing weight - C. difficile came back positive likely due to antibiotic use, first episode - Placed on IV fluids, metronidazole for 14 days, probiotic -The patient reports 20 episodes of diarrhea yesterday and 10 episodes today, will obtain CT of the abdomen and pelvis  to rule out any megacolon - DC oxycodone. Continue Percocet and IV Dilaudid for pain as needed  Hypotension: Likely due to dehydration, C. difficile colitis and DKA - Continue aggressive IV fluid hydration   Code Status: Full CODE STATUS  Family Communication: Discussed in detail with the patient, all imaging results, lab results explained to the patient   Disposition Plan: Hopefully DC home in next 24-48 hours if medically stable  Time Spent in minutes   25 minutes  Procedures  None  Consults   None  DVT Prophylaxis  Lovenox  Medications  Scheduled Meds: . amitriptyline  100 mg Oral QHS  . dicyclomine  10 mg Oral TID AC & HS  . enoxaparin (LOVENOX) injection  30 mg Subcutaneous Q24H  . feeding supplement (GLUCERNA SHAKE)  237 mL Oral TID BM  . gabapentin  200 mg Oral QHS  .  HYDROmorphone (DILAUDID) injection  1 mg Intravenous Once  . insulin aspart  0-9 Units Subcutaneous TID WC  . insulin aspart  3 Units Subcutaneous TID WC  . insulin glargine  15 Units Subcutaneous QHS  . iohexol  25 mL Oral Q1 Hr x 2  .  metoCLOPramide (REGLAN) injection  10 mg Intravenous 3 times per day  . metroNIDAZOLE  500 mg Oral 3 times per day  . saccharomyces boulardii  250 mg Oral BID   Continuous Infusions: . sodium chloride 125 mL/hr at 05/30/14 0219   PRN Meds:.HYDROmorphone, oxyCODONE-acetaminophen **AND** [DISCONTINUED] oxyCODONE, traMADol   Antibiotics   Anti-infectives    Start     Dose/Rate Route Frequency Ordered Stop   05/29/14 1400  metroNIDAZOLE (FLAGYL) tablet 500 mg     500 mg Oral 3 times per day 05/29/14 1022 06/12/14 1359        Subjective:   [name] was seen and examined today. Patient reports that she continues to have diarrhea although somewhat better today but states 10 episodes, no fevers or chills, still has some abdominal pain, no nausea or vomiting   Objective:   Blood pressure 109/78, pulse 89, temperature 98.6 F (37 C), temperature source  Oral, resp. rate 18, height 5\' 5"  (1.651 m), weight 45 kg (99 lb 3.3 oz), last menstrual period 01/26/2014, SpO2 97 %.  Wt Readings from Last 3 Encounters:  05/28/14 45 kg (99 lb 3.3 oz)  05/09/14 45.36 kg (100 lb)  04/02/14 49.896 kg (110 lb)     Intake/Output Summary (Last 24 hours) at 05/30/14 1146 Last data filed at 05/30/14 1002  Gross per 24 hour  Intake 3947.5 ml  Output    225 ml  Net 3722.5 ml    Exam  General: Alert and oriented x 3, NAD  HEENT:  PERRLA, EOMI, Anicteic Sclera, mucous membranes moist.   Neck: Supple, no JVD, no masses  CVS: S1 S2 auscultated, no rubs, murmurs or gallops. Regular rate and rhythm.  Respiratory: Clear to auscultation bilaterally, no wheezing, rales or rhonchi  Abdomen: Soft, mild diffuse abd tenderness better from yesterday, nondistended, + bowel sounds  Ext: no cyanosis clubbing or edema  Neuro: AAOx3, Cr N's II- XII. Strength 5/5 upper and lower extremities bilaterally  Skin: No rashes  Psych: Normal affect and demeanor, alert and oriented x3    Data Review   Micro Results Recent Results (from the past 240 hour(s))  Clostridium Difficile by PCR     Status: Abnormal   Collection Time: 05/29/14  7:49 AM  Result Value Ref Range Status   C difficile by pcr POSITIVE (A) NEGATIVE Final    Comment: CRITICAL RESULT CALLED TO, READ BACK BY AND VERIFIED WITHDorothyann Gibbs RN AT 4709 05/29/14 MULLINS,T     Radiology Reports No results found.  CBC  Recent Labs Lab 05/28/14 1203  WBC 10.1  HGB 14.9  HCT 44.0  PLT 276  MCV 93.4  MCH 31.6  MCHC 33.9  RDW 12.6  LYMPHSABS 1.1  MONOABS 0.4  EOSABS 0.0  BASOSABS 0.0    Chemistries   Recent Labs Lab 05/28/14 1203 05/28/14 1517 05/28/14 1922 05/29/14 0423 05/29/14 1650  NA 127* 132* 134* 135  --   K 5.0 4.0 3.8 3.8  --   CL 91* 104 107 107  --   CO2 12* 16* 20 21  --   GLUCOSE 598* 310* 124* 214* 385*  BUN 16 13 11 13   --   CREATININE 0.99 0.74 0.69 0.67  --    CALCIUM 9.9 8.2* 8.4 8.2*  --    ------------------------------------------------------------------------------------------------------------------ estimated creatinine clearance is 70.4 mL/min (by C-G formula based on Cr of 0.67). ------------------------------------------------------------------------------------------------------------------ No results for input(s): HGBA1C in the last 72 hours. ------------------------------------------------------------------------------------------------------------------ No results for input(s): CHOL,  HDL, LDLCALC, TRIG, CHOLHDL, LDLDIRECT in the last 72 hours. ------------------------------------------------------------------------------------------------------------------ No results for input(s): TSH, T4TOTAL, T3FREE, THYROIDAB in the last 72 hours.  Invalid input(s): FREET3 ------------------------------------------------------------------------------------------------------------------ No results for input(s): VITAMINB12, FOLATE, FERRITIN, TIBC, IRON, RETICCTPCT in the last 72 hours.  Coagulation profile No results for input(s): INR, PROTIME in the last 168 hours.  No results for input(s): DDIMER in the last 72 hours.  Cardiac Enzymes No results for input(s): CKMB, TROPONINI, MYOGLOBIN in the last 168 hours.  Invalid input(s): CK ------------------------------------------------------------------------------------------------------------------ Invalid input(s): Kappa  05/29/14 0758 05/29/14 1135 05/29/14 1624 05/29/14 2135 05/30/14 0736 05/30/14 1130  GLUCAP 255* 243* 409* 186* 142* 207*     Malka Bocek M.D. [facility] 05/30/2014, 11:46 AM  Pager: [phone]   Between 7am to 7pm - call Pager - [phone]  After 7pm go to [email or web address] - password [email or web address]  Call night coverage person covering after 7pm

## 2014-05-30 NOTE — Progress Notes (Signed)
Report received from Sharyon Cable, RN. I agree with previous RN's assessment. Will continue to monitor pt closely. Carnella Gu[place] I

## 2014-05-30 NOTE — Progress Notes (Signed)
Inpatient Diabetes Program Recommendations  AACE/ADA: New Consensus Statement on Inpatient Glycemic Control (2013)  Target Ranges:  Prepandial:   less than 140 mg/dL      Peak postprandial:   less than 180 mg/dL (1-2 hours)      Critically ill patients:  140 - 180 mg/dL    Results for [name], [name] (MRN [id number]) as of 05/30/2014 09:56  Ref. Range 05/29/2014 07:58 05/29/2014 11:35 05/29/2014 16:24 05/29/2014 21:35  Glucose-Capillary Latest Range: 70-99 mg/dL 255 (H) 243 (H) 409 (H) 186 (H)   Results for [name], [name] (MRN [id number]) as of 05/30/2014 09:56  Ref. Range 05/30/2014 07:36  Glucose-Capillary Latest Range: 70-99 mg/dL 142 (H)    Admit with: DKA  History: Type 1 DM, Frequent admissions for DKA, Gastroparesis  Home DM Meds: Lantus 14 units QAM  Novolog 1 unit for every 15 grams of carbohydrates consumed  Novolog SSI per the following scale:  +1 unit- 200-300 mg/dl  +2 units- 301-400 mg/dl  +3 units- >400 mg/dl   Current DM Orders: Lantus 15 units QHS    Novolog 3 units tidwc    Novolog Sensitive SSI    **Per chart Review, note that patient saw her Endocrinologist (Dr. Philemon Kingdom with [facility]) on 05/09/14. During that visit, patient's home insulin regimen was adjusted to the above listed insulins.   **Note that patient used to use an insulin pump but had frequent visits to the hospital for DKA and her pump was discontinued by her Endocrinologist.  **During her visit with Dr. Cruzita Lederer on 05/09/14, patient was re-evaluated for insulin pump. Per records, patient needs to complete a carbohydrate counting course with the CDE at the [facility] before she can restart her insulin pump. Patient has an appointment for a carbohydrate class on 06/07/14 and then will see [name], CDE for  insulin pump restart on 06/12/14.     MD- Note patient had elevated postprandial glucose levels yesterday.  Eating 100% of meals.  May want to consider increasing Novolog meal coverage to 5 units tidwc     Will follow Mandy Quaker RN, MSN, CDE Diabetes Coordinator Inpatient Diabetes Program Team Pager: [phone] (8a-5p)

## 2014-05-31 DIAGNOSIS — E43 Unspecified severe protein-calorie malnutrition: Secondary | ICD-10-CM | POA: Insufficient documentation

## 2014-05-31 LAB — GLUCOSE, CAPILLARY
GLUCOSE-CAPILLARY: 149 mg/dL — AB (ref 70–99)
Glucose-Capillary: 135 mg/dL — ABNORMAL HIGH (ref 70–99)

## 2014-05-31 LAB — CBC
HEMATOCRIT: 30.6 % — AB (ref 36.0–46.0)
HEMOGLOBIN: 10.5 g/dL — AB (ref 12.0–15.0)
MCH: 31.1 pg (ref 26.0–34.0)
MCHC: 34.3 g/dL (ref 30.0–36.0)
MCV: 90.5 fL (ref 78.0–100.0)
Platelets: 183 10*3/uL (ref 150–400)
RBC: 3.38 MIL/uL — AB (ref 3.87–5.11)
RDW: 12.2 % (ref 11.5–15.5)
WBC: 5.1 10*3/uL (ref 4.0–10.5)

## 2014-05-31 LAB — BASIC METABOLIC PANEL
ANION GAP: 7 (ref 5–15)
BUN: 11 mg/dL (ref 6–23)
CALCIUM: 8.2 mg/dL — AB (ref 8.4–10.5)
CO2: 25 mmol/L (ref 19–32)
CREATININE: 0.5 mg/dL (ref 0.50–1.10)
Chloride: 104 mmol/L (ref 96–112)
Glucose, Bld: 169 mg/dL — ABNORMAL HIGH (ref 70–99)
Potassium: 3.6 mmol/L (ref 3.5–5.1)
Sodium: 136 mmol/L (ref 135–145)

## 2014-05-31 MED ORDER — SACCHAROMYCES BOULARDII 250 MG PO CAPS: 500.0000 mg | ORAL_CAPSULE | Freq: Two times a day (BID) | ORAL | Status: DC

## 2014-05-31 MED ORDER — CHOLESTYRAMINE LIGHT 4 G PO PACK: 4.0000 g | PACK | Freq: Every day | ORAL | Status: DC | PRN

## 2014-05-31 MED ORDER — METRONIDAZOLE 500 MG PO TABS: 500.0000 mg | ORAL_TABLET | Freq: Three times a day (TID) | ORAL | Status: DC

## 2014-05-31 MED ORDER — OXYCODONE-ACETAMINOPHEN 5-325 MG PO TABS: 1.0000 | ORAL_TABLET | Freq: Four times a day (QID) | ORAL | Status: DC | PRN

## 2014-05-31 MED ORDER — CHOLESTYRAMINE LIGHT 4 G PO PACK
4.0000 g | PACK | Freq: Two times a day (BID) | ORAL | Status: DC
  Administered 2014-05-31: 4 g via ORAL
  Filled 2014-05-31 (×2): qty 1

## 2014-05-31 MED ORDER — OXYCODONE-ACETAMINOPHEN 10-325 MG PO TABS: 1.0000 | ORAL_TABLET | Freq: Three times a day (TID) | ORAL | Status: DC | PRN

## 2014-05-31 MED ORDER — SACCHAROMYCES BOULARDII 250 MG PO CAPS
500.0000 mg | ORAL_CAPSULE | Freq: Two times a day (BID) | ORAL | Status: DC
  Administered 2014-05-31: 500 mg via ORAL
  Filled 2014-05-31: qty 2

## 2014-05-31 NOTE — Discharge Summary (Signed)
Physician Discharge Summary   Patient ID: [name] MRN: [id number] DOB/AGE: [birth year] 35 y.o.  Admit date: 05/28/2014 Discharge date: 05/31/2014  Primary Care Physician:  [name], MD  Discharge Diagnoses:    . Diabetic gastroparesis associated with type 1 diabetes mellitus . DKA (diabetic ketoacidoses) . C. difficile colitis Severe protein calorie malnutrition  Consults:  None   Recommendations for Outpatient Follow-up:  Patient was recommended to follow-up with PCP regarding insulin regimen. It appears that her diabetic ketoacidosis may have been precipitated due to C. difficile colitis.   TESTS THAT NEED FOLLOW-UP CBG, hemoglobin A1c   DIET: Carb modified diet    Allergies:   Allergies  Allergen Reactions  . Scallops [Shellfish Allergy] Anaphylaxis  . Morphine And Related Itching  . Latex Rash     Discharge Medications:   Medication List    STOP taking these medications        bismuth subsalicylate 502 DX/41OI suspension  Commonly known as:  PEPTO BISMOL     cephALEXin 500 MG capsule  Commonly known as:  KEFLEX     famotidine 20 MG tablet  Commonly known as:  PEPCID     oxyCODONE-acetaminophen 10-325 MG per tablet  Commonly known as:  PERCOCET  Replaced by:  oxyCODONE-acetaminophen 5-325 MG per tablet     pantoprazole 40 MG tablet  Commonly known as:  PROTONIX      TAKE these medications        acetaminophen 500 MG tablet  Commonly known as:  TYLENOL  Take 500 mg by mouth every 6 (six) hours as needed for moderate pain or headache.     amitriptyline 50 MG tablet  Commonly known as:  ELAVIL  Take 100 mg by mouth at bedtime.     cholestyramine light 4 G packet  Commonly known as:  PREVALITE  Take 1 packet (4 g total) by mouth daily as needed (diarrhea).     clotrimazole-betamethasone cream  Commonly known as:  LOTRISONE  Apply 1 application topically daily as needed (rash).     dicyclomine 10 MG capsule  Commonly known as:   BENTYL  Take 10 mg by mouth 4 (four) times daily -  before meals and at bedtime.     gabapentin 100 MG capsule  Commonly known as:  NEURONTIN  Take 200 mg by mouth at bedtime.     glucagon 1 MG injection  Commonly known as:  GLUCAGON EMERGENCY  Inject 1 mg into the muscle once as needed (severe hypoglycemia).     insulin aspart 100 UNIT/ML injection  Commonly known as:  novoLOG  - Inject 1-3 Units into the skin 3 (three) times daily with meals. CBG < 70: implement hypoglycemia protocol  - CBG > 400: call MD   -   - CBG 70 - 120: 0 units  - CBG 121 - 150: 3 units  - CBG 151 - 200: 4 units  - CBG 201 - 250: 7 units  - CBG 251 - 300: 11 units  - CBG 301 - 350: 15 units  - CBG 351 - 400: 20 units  - CBG > 400: call MD     insulin glargine 100 UNIT/ML injection  Commonly known as:  LANTUS  Inject 0.15 mLs (15 Units total) into the skin every morning.     metoCLOPramide 10 MG tablet  Commonly known as:  REGLAN  Take 1 tablet (10 mg total) by mouth 4 (four) times daily -  before meals and at bedtime.  metroNIDAZOLE 500 MG tablet  Commonly known as:  FLAGYL  Take 1 tablet (500 mg total) by mouth 3 (three) times daily. X 2 weeks     multivitamin with minerals Tabs tablet  Take 1 tablet by mouth every morning. Diabetic support vitamin     ondansetron 4 MG disintegrating tablet  Commonly known as:  ZOFRAN ODT  Take 1 tablet (4 mg total) by mouth every 8 (eight) hours as needed for nausea or vomiting.     oxyCODONE-acetaminophen 5-325 MG per tablet  Commonly known as:  PERCOCET/ROXICET  Take 1-2 tablets by mouth every 6 (six) hours as needed for moderate pain.     saccharomyces boulardii 250 MG capsule  Commonly known as:  FLORASTOR  Take 2 capsules (500 mg total) by mouth 2 (two) times daily. X 3 weeks     traMADol 50 MG tablet  Commonly known as:  ULTRAM  Take 1 tablet (50 mg total) by mouth every 12 (twelve) hours as needed for moderate pain.     VITAMIN  B-12 PO  Take 5,000 tablets by mouth 2 (two) times daily.         Brief H and P: For complete details please refer to admission H and P, but in brief [name] is a 34 y.o. female with PMH significant for Diabetes type 1, Diabetes neuropathy, DM gastroparesis, recurrent admission for DKA, who presented complaining of nausea, vomiting 5 episodes. Also related watery stool more than 10 episodes that started day prior to admission, denied recent antibiotics use. She also complained of cramping abdominal pain./ She has been using her 14 units of lantus. Denied fever, cough, dysuria, chest pain or dyspnea.   Hospital Course:   DKA (diabetic ketoacidoses): Likely precipitated due to acute C. difficile colitis. Bicarbonate was 12, anion gap 24 with blood sugar of 598 at the time of admission. Patient was placed on aggressive IV fluid hydration and IV insulin drip. Patient has now been transitioned to subcutaneous insulin and on her insulin regimen. Patient is also scheduled with diabetic classes outpatient, recommended to follow-up with her physician outpatient, keep a log of her blood sugars and bring it to her appointment for any change in her outpatient insulin regimen.   Diabetic gastroparesis associated with type 1 diabetes mellitus Currently stable, patient is tolerating oral diet without any difficulty. She was continued on Reglan   Acute C. difficile colitis: History obtained from the patient in detail, she had strep UTI in a generally 2016, strep throat in February 2016, was on Keflex. Patient reports diarrhea going on for at least 2 months, has not been eating well, losing weight C. difficile came back positive likely due to antibiotic use, first episode. She was placed on metronidazole orally for 14 days with probiotic PPI was discontinued. CT of the abdomen and pelvis confirms colitis in the sigmoid and rectal area. No megacolon   Hypotension: Likely due to dehydration, C.  difficile colitis and DKA, improved  Day of Discharge BP 109/76 mmHg  Pulse 104  Temp(Src) 99 F (37.2 C) (Oral)  Resp 18  Ht 5\' 5"  (1.651 m)  Wt 45 kg (99 lb 3.3 oz)  BMI 16.51 kg/m2  SpO2 100%  LMP 01/26/2014  Physical Exam: General: Alert and awake oriented x3 not in any acute distress. HEENT: anicteric sclera, pupils reactive to light and accommodation CVS: S1-S2 clear no murmur rubs or gallops Chest: clear to auscultation bilaterally, no wheezing rales or rhonchi Abdomen: soft nontender, nondistended,  normal bowel sounds Extremities: no cyanosis, clubbing or edema noted bilaterally Neuro: Cranial nerves II-XII intact, no focal neurological deficits   The results of significant diagnostics from this hospitalization (including imaging, microbiology, ancillary and laboratory) are listed below for reference.    LAB RESULTS: Basic Metabolic Panel:  Recent Labs Lab 05/29/14 0423 05/29/14 1650 05/31/14 0508  NA 135  --  136  K 3.8  --  3.6  CL 107  --  104  CO2 21  --  25  GLUCOSE 214* 385* 169*  BUN 13  --  11  CREATININE 0.67  --  0.50  CALCIUM 8.2*  --  8.2*   Liver Function Tests: No results for input(s): AST, ALT, ALKPHOS, BILITOT, PROT, ALBUMIN in the last 168 hours.  Recent Labs Lab 05/28/14 1203  LIPASE 12   No results for input(s): AMMONIA in the last 168 hours. CBC:  Recent Labs Lab 05/28/14 1203 05/31/14 0508  WBC 10.1 5.1  NEUTROABS 8.7*  --   HGB 14.9 10.5*  HCT 44.0 30.6*  MCV 93.4 90.5  PLT 276 183   Cardiac Enzymes: No results for input(s): CKTOTAL, CKMB, CKMBINDEX, TROPONINI in the last 168 hours. BNP: Invalid input(s): POCBNP CBG:  Recent Labs Lab 05/31/14 0712 05/31/14 1158  GLUCAP 149* 135*    Significant Diagnostic Studies:  No results found.  2D ECHO:   Disposition and Follow-up:     Discharge Instructions    Diet Carb Modified    Complete by:  As directed      Discharge instructions    Complete by:  As  directed   It is VERY IMPORTANT that you follow up with a PCP on a regular basis.  Check your blood glucoses before each meal and at bedtime and maintain a log of your readings.  Bring this log with you when you follow up with your PCP so that he or she can adjust your insulin at your follow up visit.     Increase activity slowly    Complete by:  As directed             DISPOSITION: home   DISCHARGE FOLLOW-UP Follow-up Information    Follow up with [facility], [place]. Go on 06/11/2014.   Specialty:  Internal Medicine   Why:  at 2:00pm For Post Hospitalization Follow Up   Contact information:   [address] [place] [address] [phone]        Time spent on Discharge: 35 mins  Signed:   Martice Doty M.D. [facility] 05/31/2014, 2:27 PM Pager: [phone]

## 2014-05-31 NOTE — Progress Notes (Signed)
Taking over care of patient. Agree with previous RN assessment, patient denies any needs at this time. Will continue to monitor.

## 2014-05-31 NOTE — Progress Notes (Signed)
[facility]                                                                              Patient Demographics  [name], is a 34 y.o. female, DOB - April 22, 1980, MVE:[id number]  Admit date - 05/28/2014   Admitting Physician Elmarie Shiley, MD  Outpatient Primary MD for the patient is [facility], MD  LOS - 3   Chief Complaint  Patient presents with  . Hyperglycemia  . Diarrhea  . Emesis  . Abdominal Pain       Brief HPI   [name] is a 34 y.o. female with PMH significant for Diabetes type 1, Diabetes neuropathy, DM gastroparesis, recurrent admission for DKA, who presents complaining of nausea, vomiting 5 episodes. Also relates watery stool more than 10 episodes that started day prior to admission, denies recent antibiotics use. She also complained of cramping abdominal pain./ She has been using her 14 units of lantus. Denies fever, cough, dysuria, chest pain or dyspnea.     Assessment & Plan    Principal Problem:   DKA (diabetic ketoacidoses) - Gap closed, patient started on subcutaneous insulin -Blood sugars better controlled, continue NovoLog meal coverage, continue Levemir, sliding scale insulin - Continue aggressive IV fluid hydration, until the diarrhea has resolved - Nutrition consult, diabetic coordinator consult placed  Active Problems:   Diabetic gastroparesis associated with type 1 diabetes mellitus - Continue IV fluids and Reglan    C. difficile colitis: History obtained from the patient in detail, she had strep UTI in a generally 2016, strep throat in February 2016, was on Keflex. Patient reports diarrhea going on for at least 2 months, has not been eating well, losing weight - C. difficile came back positive likely due to antibiotic use, first episode - Placed on IV fluids, metronidazole for 14 days, probiotic -CT of the abdomen and pelvis confirms colitis in the sigmoid and rectal area. No megacolon  - Patient continues to  complain of diarrhea however no documentation from the nursing staff, discussed with the patient that she has to call the staff, I discontinued Dilaudid today. Does not appear to be in any significant pain as reporting. Also updated RN.  Hypotension: Likely due to dehydration, C. difficile colitis and DKA - Continue aggressive IV fluid hydration   Code Status: Full CODE STATUS  Family Communication: Discussed in detail with the patient, all imaging results, lab results explained to the patient   Disposition Plan: DC home in a.m.  Time Spent in minutes   25 minutes  Procedures  None  Consults   None  DVT Prophylaxis  Lovenox  Medications  Scheduled Meds: . amitriptyline  100 mg Oral QHS  . cholestyramine light  4 g Oral BID  . dicyclomine  10 mg Oral TID AC & HS  . enoxaparin (LOVENOX) injection  30 mg Subcutaneous Q24H  . gabapentin  200 mg Oral QHS  . insulin aspart  0-9 Units Subcutaneous TID WC  . insulin aspart  5 Units Subcutaneous TID WC  . insulin glargine  15 Units Subcutaneous QHS  . metoCLOPramide (REGLAN) injection  10 mg Intravenous 3  times per day  . metroNIDAZOLE  500 mg Oral 3 times per day  . saccharomyces boulardii  500 mg Oral BID   Continuous Infusions: . sodium chloride 125 mL/hr at 05/31/14 0627   PRN Meds:.oxyCODONE-acetaminophen **AND** [DISCONTINUED] oxyCODONE, traMADol   Antibiotics   Anti-infectives    Start     Dose/Rate Route Frequency Ordered Stop   05/29/14 1400  metroNIDAZOLE (FLAGYL) tablet 500 mg     500 mg Oral 3 times per day 05/29/14 1022 06/12/14 1359        Subjective:   Jolyn Deshmukh was seen and examined today. Continues to complain of diarrhea, no significant abdominal pain or tenderness no fevers or chills., No nausea or vomiting, eating fine    Objective:   Blood pressure 105/74, pulse 87, temperature 98 F (36.7 C), temperature source Oral, resp. rate 18, height 5\' 5"  (1.651 m), weight 45 kg (99 lb 3.3 oz),  last menstrual period 01/26/2014, SpO2 100 %.  Wt Readings from Last 3 Encounters:  05/28/14 45 kg (99 lb 3.3 oz)  05/09/14 45.36 kg (100 lb)  04/02/14 49.896 kg (110 lb)     Intake/Output Summary (Last 24 hours) at 05/31/14 1222 Last data filed at 05/31/14 0700  Gross per 24 hour  Intake   1840 ml  Output      0 ml  Net   1840 ml    Exam  General: Alert and oriented x 3, NAD  HEENT:  PERRLA, EOMI, Anicteic Sclera, mucous membranes moist.   Neck: Supple, no JVD, no masses  CVS: S1 S2 auscultated, no rubs, murmurs or gallops. Regular rate and rhythm.  Respiratory: Clear to auscultation bilaterally, no wheezing, rales or rhonchi  Abdomen: Soft, no significant abdominal tenderness, nondistended, + bowel sounds  Ext: no cyanosis clubbing or edema  Neuro: AAOx3, Cr N's II- XII. Strength 5/5 upper and lower extremities bilaterally  Skin: No rashes  Psych: Normal affect and demeanor, alert and oriented x3    Data Review   Micro Results Recent Results (from the past 240 hour(s))  Clostridium Difficile by PCR     Status: Abnormal   Collection Time: 05/29/14  7:49 AM  Result Value Ref Range Status   C difficile by pcr POSITIVE (A) NEGATIVE Final    Comment: CRITICAL RESULT CALLED TO, READ BACK BY AND VERIFIED WITH: GAGLIANO,S. RN AT [phone] 05/29/14 MULLINS,T     Radiology Reports Ct Abdomen Pelvis W Contrast  05/30/2014   CLINICAL DATA:  Abdomen pain, gastro paresis, weight loss and C diff infection.  EXAM: CT ABDOMEN AND PELVIS WITH CONTRAST  TECHNIQUE: Multidetector CT imaging of the abdomen and pelvis was performed using the standard protocol following bolus administration of intravenous contrast.  CONTRAST:  1mL OMNIPAQUE IOHEXOL 300 MG/ML  SOLN  COMPARISON:  February 15, 2014  FINDINGS: The liver, pancreas, adrenal glands and kidneys are normal. There are calcified granulomas within the spleen. The gallbladder is decompressed limiting evaluation. Minimal ascites is  noted surrounding the liver. The aorta is normal in caliber. Minimal atherosclerosis of the aorta is noted. There is no abdominal lymphadenopathy.  The stomach is distended consistent with known gastroparesis. The small bowel is normal. Moderate bowel content is identified throughout colon. The appendix is normal. There is stranding and inflammation surrounding the rectum and distal portion of the sigmoid colon with small amount of fluid in the posterior pelvis and stranding in the presacral space.  There is mild diffuse bladder wall thickening with surrounding stranding.  The uterus is normal. Small amount of free fluid is identified within the pelvis. The visualized lung bases are clear. No acute abnormality is identified within the visualized bones.  IMPRESSION: Stranding and inflammation surrounding the rectum and distal portion of sigmoid colon consistent with colitis.  Distended stomach consistent with known gastroparesis.  Minimal ascites in the abdomen.   Electronically Signed   By: Abelardo Diesel M.D.   On: 05/30/2014 15:10    CBC  Recent Labs Lab 05/28/14 1203 05/31/14 0508  WBC 10.1 5.1  HGB 14.9 10.5*  HCT 44.0 30.6*  PLT 276 183  MCV 93.4 90.5  MCH 31.6 31.1  MCHC 33.9 34.3  RDW 12.6 12.2  LYMPHSABS 1.1  --   MONOABS 0.4  --   EOSABS 0.0  --   BASOSABS 0.0  --     Chemistries   Recent Labs Lab 05/28/14 1203 05/28/14 1517 05/28/14 1922 05/29/14 0423 05/29/14 1650 05/31/14 0508  NA 127* 132* 134* 135  --  136  K 5.0 4.0 3.8 3.8  --  3.6  CL 91* 104 107 107  --  104  CO2 12* 16* 20 21  --  25  GLUCOSE 598* 310* 124* 214* 385* 169*  BUN 16 13 11 13   --  11  CREATININE 0.99 0.74 0.69 0.67  --  0.50  CALCIUM 9.9 8.2* 8.4 8.2*  --  8.2*   ------------------------------------------------------------------------------------------------------------------ estimated creatinine clearance is 70.4 mL/min (by C-G formula based on Cr of  0.5). ------------------------------------------------------------------------------------------------------------------ No results for input(s): HGBA1C in the last 72 hours. ------------------------------------------------------------------------------------------------------------------ No results for input(s): CHOL, HDL, LDLCALC, TRIG, CHOLHDL, LDLDIRECT in the last 72 hours. ------------------------------------------------------------------------------------------------------------------ No results for input(s): TSH, T4TOTAL, T3FREE, THYROIDAB in the last 72 hours.  Invalid input(s): FREET3 ------------------------------------------------------------------------------------------------------------------ No results for input(s): VITAMINB12, FOLATE, FERRITIN, TIBC, IRON, RETICCTPCT in the last 72 hours.  Coagulation profile No results for input(s): INR, PROTIME in the last 168 hours.  No results for input(s): DDIMER in the last 72 hours.  Cardiac Enzymes No results for input(s): CKMB, TROPONINI, MYOGLOBIN in the last 168 hours.  Invalid input(s): CK ------------------------------------------------------------------------------------------------------------------ Invalid input(s): Murphy  05/30/14 0736 05/30/14 1130 05/30/14 1701 05/30/14 2118 05/31/14 0712 05/31/14 1158  GLUCAP 142* 207* 166* 282* 149* 135*     RAI,RIPUDEEP M.D. [facility] 05/31/2014, 12:22 PM  Pager: [phone]   Between 7am to 7pm - call Pager - [phone]  After 7pm go to [email or web address] - password [email or web address]  Call night coverage person covering after 7pm

## 2014-06-07 ENCOUNTER — Ambulatory Visit: Payer: Medicaid Other | Admitting: Dietician

## 2014-06-11 ENCOUNTER — Emergency Department (HOSPITAL_COMMUNITY)
Admission: EM | Admit: 2014-06-11 | Discharge: 2014-06-11 | Disposition: A | Discharge status: Home / Self Care | Payer: Medicaid Other | Attending: Emergency Medicine | Admitting: Emergency Medicine

## 2014-06-11 ENCOUNTER — Encounter (HOSPITAL_COMMUNITY): Payer: Self-pay | Admitting: *Deleted

## 2014-06-11 DIAGNOSIS — R739 Hyperglycemia, unspecified: Secondary | ICD-10-CM

## 2014-06-11 DIAGNOSIS — E860 Dehydration: Secondary | ICD-10-CM | POA: Insufficient documentation

## 2014-06-11 DIAGNOSIS — R000 Tachycardia, unspecified: Secondary | ICD-10-CM | POA: Insufficient documentation

## 2014-06-11 DIAGNOSIS — K219 Gastro-esophageal reflux disease without esophagitis: Secondary | ICD-10-CM | POA: Insufficient documentation

## 2014-06-11 DIAGNOSIS — R51 Headache: Secondary | ICD-10-CM | POA: Diagnosis not present

## 2014-06-11 DIAGNOSIS — Z87891 Personal history of nicotine dependence: Secondary | ICD-10-CM | POA: Diagnosis not present

## 2014-06-11 DIAGNOSIS — Z3202 Encounter for pregnancy test, result negative: Secondary | ICD-10-CM | POA: Insufficient documentation

## 2014-06-11 DIAGNOSIS — Z79899 Other long term (current) drug therapy: Secondary | ICD-10-CM | POA: Diagnosis not present

## 2014-06-11 DIAGNOSIS — F410 Panic disorder [episodic paroxysmal anxiety] without agoraphobia: Secondary | ICD-10-CM | POA: Diagnosis not present

## 2014-06-11 DIAGNOSIS — D649 Anemia, unspecified: Secondary | ICD-10-CM | POA: Insufficient documentation

## 2014-06-11 DIAGNOSIS — Z794 Long term (current) use of insulin: Secondary | ICD-10-CM | POA: Insufficient documentation

## 2014-06-11 DIAGNOSIS — E1065 Type 1 diabetes mellitus with hyperglycemia: Secondary | ICD-10-CM | POA: Insufficient documentation

## 2014-06-11 DIAGNOSIS — Z9849 Cataract extraction status, unspecified eye: Secondary | ICD-10-CM | POA: Insufficient documentation

## 2014-06-11 DIAGNOSIS — N189 Chronic kidney disease, unspecified: Secondary | ICD-10-CM | POA: Insufficient documentation

## 2014-06-11 DIAGNOSIS — Z91040 Latex allergy status: Secondary | ICD-10-CM | POA: Diagnosis not present

## 2014-06-11 DIAGNOSIS — R1084 Generalized abdominal pain: Secondary | ICD-10-CM | POA: Insufficient documentation

## 2014-06-11 DIAGNOSIS — R519 Headache, unspecified: Secondary | ICD-10-CM

## 2014-06-11 DIAGNOSIS — Z792 Long term (current) use of antibiotics: Secondary | ICD-10-CM | POA: Diagnosis not present

## 2014-06-11 LAB — COMPREHENSIVE METABOLIC PANEL
ALK PHOS: 63 U/L (ref 39–117)
ALT: 68 U/L — ABNORMAL HIGH (ref 0–35)
AST: 58 U/L — ABNORMAL HIGH (ref 0–37)
Albumin: 4.7 g/dL (ref 3.5–5.2)
Anion gap: 10 (ref 5–15)
BUN: 16 mg/dL (ref 6–23)
CALCIUM: 9.4 mg/dL (ref 8.4–10.5)
CO2: 26 mmol/L (ref 19–32)
CREATININE: 0.73 mg/dL (ref 0.50–1.10)
Chloride: 95 mmol/L — ABNORMAL LOW (ref 96–112)
GFR calc Af Amer: >90 mL/min (ref >90)
GFR calc non Af Amer: >90 mL/min (ref >90)
GLUCOSE: 402 mg/dL — AB (ref 70–99)
Potassium: 3.5 mmol/L (ref 3.5–5.1)
Sodium: 131 mmol/L — ABNORMAL LOW (ref 135–145)
Total Bilirubin: 0.4 mg/dL (ref 0.3–1.2)
Total Protein: 7.6 g/dL (ref 6.0–8.3)

## 2014-06-11 LAB — BLOOD GAS, VENOUS
ACID-BASE EXCESS: 3.8 mmol/L — AB (ref 0.0–2.0)
Bicarbonate: 26.7 mEq/L — ABNORMAL HIGH (ref 20.0–24.0)
Drawn by: 30282
O2 SAT: 90.1 %
PCO2 VEN: 35.4 mmHg — AB (ref 45.0–50.0)
Patient temperature: 98.6
TCO2: 23.4 mmol/L (ref 0–100)
pH, Ven: 7.49 — ABNORMAL HIGH (ref 7.250–7.300)
pO2, Ven: 56.2 mmHg — ABNORMAL HIGH (ref 30.0–45.0)

## 2014-06-11 LAB — CBC
HCT: 36.3 % (ref 36.0–46.0)
Hemoglobin: 12.9 g/dL (ref 12.0–15.0)
MCH: 31.8 pg (ref 26.0–34.0)
MCHC: 35.5 g/dL (ref 30.0–36.0)
MCV: 89.4 fL (ref 78.0–100.0)
Platelets: 300 10*3/uL (ref 150–400)
RBC: 4.06 MIL/uL (ref 3.87–5.11)
RDW: 12.4 % (ref 11.5–15.5)
WBC: 4.4 10*3/uL (ref 4.0–10.5)

## 2014-06-11 LAB — URINE MICROSCOPIC-ADD ON

## 2014-06-11 LAB — URINALYSIS, ROUTINE W REFLEX MICROSCOPIC
Bilirubin Urine: NEGATIVE
Glucose, UA: >1000 mg/dL — AB
Hgb urine dipstick: NEGATIVE
KETONES UR: NEGATIVE mg/dL
LEUKOCYTES UA: NEGATIVE
NITRITE: NEGATIVE
PROTEIN: NEGATIVE mg/dL
Specific Gravity, Urine: >1.046 — ABNORMAL HIGH (ref 1.005–1.030)
Urobilinogen, UA: 0.2 mg/dL (ref 0.0–1.0)
pH: 7 (ref 5.0–8.0)

## 2014-06-11 LAB — POC URINE PREG, ED: Preg Test, Ur: NEGATIVE

## 2014-06-11 LAB — CBG MONITORING, ED
GLUCOSE-CAPILLARY: 202 mg/dL — AB (ref 70–99)
Glucose-Capillary: 326 mg/dL — ABNORMAL HIGH (ref 70–99)

## 2014-06-11 LAB — LIPASE, BLOOD: LIPASE: 14 U/L (ref 11–59)

## 2014-06-11 MED ORDER — PROMETHAZINE HCL 25 MG PO TABS: 25.0000 mg | ORAL_TABLET | Freq: Four times a day (QID) | ORAL | Status: DC | PRN

## 2014-06-11 MED ORDER — OXYCODONE-ACETAMINOPHEN 5-325 MG PO TABS
1.0000 | ORAL_TABLET | ORAL | Status: DC | PRN
Start: 2014-06-11 — End: 2014-07-06

## 2014-06-11 MED ORDER — HYDROMORPHONE HCL 1 MG/ML IJ SOLN
1.0000 mg | Freq: Once | INTRAMUSCULAR | Status: AC
Start: 2014-06-11 — End: 2014-06-11
  Administered 2014-06-11: 1 mg via INTRAVENOUS
  Filled 2014-06-11: qty 1

## 2014-06-11 MED ORDER — SODIUM CHLORIDE 0.9 % IV BOLUS (SEPSIS)
1000.0000 mL | Freq: Once | INTRAVENOUS | Status: AC
  Administered 2014-06-11: 1000 mL via INTRAVENOUS

## 2014-06-11 MED ORDER — HYDROMORPHONE HCL 1 MG/ML IJ SOLN
1.0000 mg | Freq: Once | INTRAMUSCULAR | Status: AC
  Administered 2014-06-11: 1 mg via INTRAVENOUS
  Filled 2014-06-11: qty 1

## 2014-06-11 MED ORDER — ONDANSETRON HCL 4 MG/2ML IJ SOLN
4.0000 mg | Freq: Once | INTRAMUSCULAR | Status: AC
  Administered 2014-06-11: 4 mg via INTRAVENOUS
  Filled 2014-06-11: qty 2

## 2014-06-11 MED ORDER — METOCLOPRAMIDE HCL 5 MG/ML IJ SOLN
10.0000 mg | Freq: Once | INTRAMUSCULAR | Status: AC
  Administered 2014-06-11: 10 mg via INTRAVENOUS
  Filled 2014-06-11: qty 2

## 2014-06-11 NOTE — ED Notes (Signed)
Pt tolerated ginger ale PO.

## 2014-06-11 NOTE — ED Notes (Signed)
Pt reports hx of type I diabetes, 2 weeks ago admitted for DKA. Pt had cataract surgery yesterday, pt saw eye doctor this morning, who referred pt to ED. Pt yesterday had eye pain, triggered headache, pt started vomiting which increased headache pain. Now having abd pain as well. Pain 9/10. Pt vomited x4 since last night. CBG 380 at home. Pt took long acting insulin this morning.

## 2014-06-11 NOTE — ED Provider Notes (Signed)
CSN: [id number]     Arrival date & time 06/11/14  0825 History   First MD Initiated Contact with Patient 06/11/14 [phone]     Chief Complaint  Patient presents with  . Hyperglycemia  . Abdominal Pain  . Headache     (Consider location/radiation/quality/duration/timing/severity/associated sxs/prior Treatment) HPI [name] is a 34 y.o. female with history of type 1 diabetes, thyroid disease, anxiety, gastroparesis, presents to emergency department complaining of headache, abdominal pain, nausea, vomiting. Patient states she had a cataract surgery yesterday. States after leaving ophthalmologist's office she developed severe headache. She was seen again last night by her ophthalmologist, was told her pressure may be slightly high in her eye, drops were applied at that time. She was rechecked again this morning and was told her eyes were good. She states the headache last night caused her to have nausea and vomiting. She states she's been vomiting all night through this morning. States this triggered her abdominal pain. She states she took Tylenol and Zofran yesterday which did not help her symptoms. She is worried she may be in DKA. Prior DKA was 2 weeks ago was admitted at that time. She denies any fever or chills. No urinary symptoms. No diarrhea. No back pain. She took her long-acting insulin this morning. She was unable to eat.  Past Medical History  Diagnosis Date  . Diabetes mellitus   . Thyroid disease     hypothyroidism associated with pregnancy  . Back pain   . Panic attack   . PONV (postoperative nausea and vomiting)   . Anxiety   . GERD (gastroesophageal reflux disease)   . DM gastroparesis   . Chronic kidney disease   . Anemia   . Gastroparesis    Past Surgical History  Procedure Laterality Date  . Laparoscopy      age 31  . Back surgery      age 19  . Tooth extraction    . Root canal  10/10/12   Family History  Problem Relation Age of Onset  . Diabetes Maternal  Grandmother   . Cancer Maternal Grandfather     Small cell lung cancer  . Cancer Maternal Grandmother     Leukemia   History  Substance Use Topics  . Smoking status: Former Smoker    Quit date: 04/19/2000  . Smokeless tobacco: Former Systems developer  . Alcohol Use: Yes     Comment: RARE SIPS OF WINE   OB History    No data available     Review of Systems  Constitutional: Positive for fatigue. Negative for fever and chills.  Respiratory: Negative for cough, chest tightness and shortness of breath.   Cardiovascular: Negative for chest pain, palpitations and leg swelling.  Gastrointestinal: Positive for nausea, vomiting and abdominal pain. Negative for diarrhea.  Genitourinary: Negative for dysuria, flank pain, vaginal bleeding, vaginal discharge, vaginal pain and pelvic pain.  Musculoskeletal: Negative for myalgias, arthralgias, neck pain and neck stiffness.  Skin: Negative for rash.  Neurological: Positive for weakness and headaches. Negative for dizziness.  All other systems reviewed and are negative.     Allergies  Scallops; Morphine and related; and Latex  Home Medications   Prior to Admission medications   Medication Sig Start Date End Date Taking? Authorizing Provider  acetaminophen (TYLENOL) 500 MG tablet Take 500 mg by mouth every 6 (six) hours as needed for moderate pain or headache.    Historical Provider, MD  amitriptyline (ELAVIL) 50 MG tablet Take 100 mg by  mouth at bedtime.     Historical Provider, MD  cholestyramine light (PREVALITE) 4 G packet Take 1 packet (4 g total) by mouth daily as needed (diarrhea). 05/31/14   Ripudeep Krystal Eaton, MD  clotrimazole-betamethasone (LOTRISONE) cream Apply 1 application topically daily as needed (rash).    Historical Provider, MD  Cyanocobalamin (VITAMIN B-12 PO) Take 5,000 tablets by mouth 2 (two) times daily.     Historical Provider, MD  dicyclomine (BENTYL) 10 MG capsule Take 10 mg by mouth 4 (four) times daily -  before meals and at  bedtime.    Historical Provider, MD  gabapentin (NEURONTIN) 100 MG capsule Take 200 mg by mouth at bedtime.    Historical Provider, MD  glucagon (GLUCAGON EMERGENCY) 1 MG injection Inject 1 mg into the muscle once as needed (severe hypoglycemia). 02/19/14   Philemon Kingdom, MD  insulin aspart (NOVOLOG) 100 UNIT/ML injection Inject 1-3 Units into the skin 3 (three) times daily with meals. CBG < 70: implement hypoglycemia protocol CBG > 400: call MD   CBG 70 - 120: 0 units CBG 121 - 150: 3 units CBG 151 - 200: 4 units CBG 201 - 250: 7 units CBG 251 - 300: 11 units CBG 301 - 350: 15 units CBG 351 - 400: 20 units CBG > 400: call MD 02/01/13   Donita Brooks, NP  insulin glargine (LANTUS) 100 UNIT/ML injection Inject 0.15 mLs (15 Units total) into the skin every morning. Patient taking differently: Inject 14 Units into the skin every morning.  02/19/14   Philemon Kingdom, MD  metoCLOPramide (REGLAN) 10 MG tablet Take 1 tablet (10 mg total) by mouth 4 (four) times daily -  before meals and at bedtime. Patient not taking: Reported on 05/09/2014 04/20/14   Dorie Rank, MD  metroNIDAZOLE (FLAGYL) 500 MG tablet Take 1 tablet (500 mg total) by mouth 3 (three) times daily. X 2 weeks 05/31/14   Ripudeep Krystal Eaton, MD  Multiple Vitamin (MULTIVITAMIN WITH MINERALS) TABS Take 1 tablet by mouth every morning. Diabetic support vitamin    Historical Provider, MD  ondansetron (ZOFRAN ODT) 4 MG disintegrating tablet Take 1 tablet (4 mg total) by mouth every 8 (eight) hours as needed for nausea or vomiting. 04/01/14   Velvet Bathe, MD  oxyCODONE-acetaminophen (PERCOCET/ROXICET) 5-325 MG per tablet Take 1-2 tablets by mouth every 6 (six) hours as needed for moderate pain. 05/31/14   Ripudeep Krystal Eaton, MD  saccharomyces boulardii (FLORASTOR) 250 MG capsule Take 2 capsules (500 mg total) by mouth 2 (two) times daily. X 3 weeks 05/31/14   Ripudeep Krystal Eaton, MD  traMADol (ULTRAM) 50 MG tablet Take 1 tablet (50 mg total) by mouth every 12  (twelve) hours as needed for moderate pain. Patient not taking: Reported on 05/28/2014 04/20/14   Dorie Rank, MD   BP 111/83 mmHg  Pulse 112  Temp(Src) 97.8 F (36.6 C) (Oral)  Ht 5\' 5"  (1.651 m)  Wt 95 lb (43.092 kg)  BMI 15.81 kg/m2  SpO2 100%  LMP 01/26/2014 Physical Exam  Constitutional: She appears well-developed and well-nourished. No distress.  HENT:  Head: Normocephalic.  Oral mucosa dry  Eyes: Conjunctivae are normal.  Neck: Neck supple.  Cardiovascular: Regular rhythm and normal heart sounds.   Tachycardic  Pulmonary/Chest: Effort normal and breath sounds normal. No respiratory distress. She has no wheezes. She has no rales.  Abdominal: Soft. Bowel sounds are normal. She exhibits no distension. There is tenderness. There is no rebound and no guarding.  Diffuse tenderness. Abdomen is soft  Musculoskeletal: She exhibits no edema.  Neurological: She is alert.  Skin: Skin is warm and dry.  Psychiatric: She has a normal mood and affect. Her behavior is normal.  Nursing note and vitals reviewed.   ED Course  Procedures (including critical care time) Labs Review Labs Reviewed  COMPREHENSIVE METABOLIC PANEL - Abnormal; Notable for the following:    Sodium 131 (*)    Chloride 95 (*)    Glucose, Bld 402 (*)    AST 58 (*)    ALT 68 (*)    All other components within normal limits  URINALYSIS, ROUTINE W REFLEX MICROSCOPIC - Abnormal; Notable for the following:    Specific Gravity, Urine >1.046 (*)    Glucose, UA >1000 (*)    All other components within normal limits  BLOOD GAS, VENOUS - Abnormal; Notable for the following:    pH, Ven 7.490 (*)    pCO2, Ven 35.4 (*)    pO2, Ven 56.2 (*)    Bicarbonate 26.7 (*)    Acid-Base Excess 3.8 (*)    All other components within normal limits  CBG MONITORING, ED - Abnormal; Notable for the following:    Glucose-Capillary 326 (*)    All other components within normal limits  CBG MONITORING, ED - Abnormal; Notable for the  following:    Glucose-Capillary 202 (*)    All other components within normal limits  CBC  LIPASE, BLOOD  URINE MICROSCOPIC-ADD ON  POC URINE PREG, ED  CBG MONITORING, ED    Imaging Review No results found.   EKG Interpretation None      MDM   Final diagnoses:  Hyperglycemia  Dehydration  Nonintractable headache, unspecified chronicity pattern, unspecified headache type   Patient in emergency department with nausea, vomiting, abdominal pain, headache. Headache most likely related to recent eye surgery. She is worried she may be DKA. Her blood sugar is 326. Will get labs, urinalysis, VBG. IV fluids started.   Patient's labs showed VBG pH of 7.49. Bicarbonate 26. Glucose 402. Anion gap is normal at 10. Patient hydrated with 3 L of normal saline, she received Zofran and Reglan for her nausea. She received IV Dilaudid for the pain. She is feeling much better. There is no ketones in the urine. There is no evidence of DKA today. She is able to drink fluids without nausea or vomiting. Her vital signs are normal. She will be discharged home with outpatient follow-up. Patient requested pain medications until she is able to see her doctor. We'll give 10 tabs of Percocet and 10 tabs of Phenergan. Return precautions discussed.  Filed Vitals:   06/11/14 0834 06/11/14 1151 06/11/14 1356  BP: 111/83 95/70 116/82  Pulse: 112 85 88  Temp: 97.8 F (36.6 C)    TempSrc: Oral    Resp:  17 17  Height: 5\' 5"  (1.651 m)    Weight: 95 lb (43.092 kg)    SpO2: 100% 100% 100%       Jeannett Senior, PA-C 06/11/14 [place], MD 06/19/14 1928

## 2014-06-11 NOTE — Discharge Instructions (Signed)
Phenergan for nausea. Percocet for pain. Follow up with your doctor. Make sure to stay hydrated. Return if worsening symptoms.   Hyperglycemia Hyperglycemia occurs when the glucose (sugar) in your blood is too high. Hyperglycemia can happen for many reasons, but it most often happens to people who do not know they have diabetes or are not managing their diabetes properly.  CAUSES  Whether you have diabetes or not, there are other causes of hyperglycemia. Hyperglycemia can occur when you have diabetes, but it can also occur in other situations that you might not be as aware of, such as: Diabetes  If you have diabetes and are having problems controlling your blood glucose, hyperglycemia could occur because of some of the following reasons:  Not following your meal plan.  Not taking your diabetes medications or not taking it properly.  Exercising less or doing less activity than you normally do.  Being sick. Pre-diabetes  This cannot be ignored. Before people develop Type 2 diabetes, they almost always have "pre-diabetes." This is when your blood glucose levels are higher than normal, but not yet high enough to be diagnosed as diabetes. Research has shown that some long-term damage to the body, especially the heart and circulatory system, may already be occurring during pre-diabetes. If you take action to manage your blood glucose when you have pre-diabetes, you may delay or prevent Type 2 diabetes from developing. Stress  If you have diabetes, you may be "diet" controlled or on oral medications or insulin to control your diabetes. However, you may find that your blood glucose is higher than usual in the hospital whether you have diabetes or not. This is often referred to as "stress hyperglycemia." Stress can elevate your blood glucose. This happens because of hormones put out by the body during times of stress. If stress has been the cause of your high blood glucose, it can be followed regularly  by your caregiver. That way he/she can make sure your hyperglycemia does not continue to get worse or progress to diabetes. Steroids  Steroids are medications that act on the infection fighting system (immune system) to block inflammation or infection. One side effect can be a rise in blood glucose. Most people can produce enough extra insulin to allow for this rise, but for those who cannot, steroids make blood glucose levels go even higher. It is not unusual for steroid treatments to "uncover" diabetes that is developing. It is not always possible to determine if the hyperglycemia will go away after the steroids are stopped. A special blood test called an A1c is sometimes done to determine if your blood glucose was elevated before the steroids were started. SYMPTOMS  Thirsty.  Frequent urination.  Dry mouth.  Blurred vision.  Tired or fatigue.  Weakness.  Sleepy.  Tingling in feet or leg. DIAGNOSIS  Diagnosis is made by monitoring blood glucose in one or all of the following ways:  A1c test. This is a chemical found in your blood.  Fingerstick blood glucose monitoring.  Laboratory results. TREATMENT  First, knowing the cause of the hyperglycemia is important before the hyperglycemia can be treated. Treatment may include, but is not be limited to:  Education.  Change or adjustment in medications.  Change or adjustment in meal plan.  Treatment for an illness, infection, etc.  More frequent blood glucose monitoring.  Change in exercise plan.  Decreasing or stopping steroids.  Lifestyle changes. HOME CARE INSTRUCTIONS   Test your blood glucose as directed.  Exercise regularly. Your  caregiver will give you instructions about exercise. Pre-diabetes or diabetes which comes on with stress is helped by exercising.  Eat wholesome, balanced meals. Eat often and at regular, fixed times. Your caregiver or nutritionist will give you a meal plan to guide your sugar  intake.  Being at an ideal weight is important. If needed, losing as little as 10 to 15 pounds may help improve blood glucose levels. SEEK MEDICAL CARE IF:   You have questions about medicine, activity, or diet.  You continue to have symptoms (problems such as increased thirst, urination, or weight gain). SEEK IMMEDIATE MEDICAL CARE IF:   You are vomiting or have diarrhea.  Your breath smells fruity.  You are breathing faster or slower.  You are very sleepy or incoherent.  You have numbness, tingling, or pain in your feet or hands.  You have chest pain.  Your symptoms get worse even though you have been following your caregiver's orders.  If you have any other questions or concerns. Document Released: 08/04/2000 Document Revised: 05/03/2011 Document Reviewed: 06/07/2011 [facility] Patient Information 2015 [place], [place]. This information is not intended to replace advice given to you by your health care provider. Make sure you discuss any questions you have with your health care provider.

## 2014-06-11 NOTE — ED Notes (Signed)
Attempted IV access and blood draw in left forearm, but unsuccessful.

## 2014-06-11 NOTE — Progress Notes (Signed)
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  CM discussed with pt and her mother at bedside that if this happens for her to call the hospital back or have the pharmacy call the hospital prior to her purchasing medications at elevated cost They agreed to do so. Mother voiced appreciation of services rendered and stated previous had services mother did not approve of but handled

## 2014-06-12 ENCOUNTER — Ambulatory Visit: Payer: Medicaid Other | Admitting: Nutrition

## 2014-06-15 ENCOUNTER — Emergency Department (HOSPITAL_COMMUNITY)
Admission: EM | Admit: 2014-06-15 | Discharge: 2014-06-16 | Disposition: A | Discharge status: Home / Self Care | Payer: Medicaid Other | Attending: Emergency Medicine | Admitting: Emergency Medicine

## 2014-06-15 ENCOUNTER — Encounter (HOSPITAL_COMMUNITY): Payer: Self-pay | Admitting: *Deleted

## 2014-06-15 DIAGNOSIS — E1143 Type 2 diabetes mellitus with diabetic autonomic (poly)neuropathy: Secondary | ICD-10-CM | POA: Insufficient documentation

## 2014-06-15 DIAGNOSIS — R1084 Generalized abdominal pain: Secondary | ICD-10-CM | POA: Insufficient documentation

## 2014-06-15 DIAGNOSIS — Z9842 Cataract extraction status, left eye: Secondary | ICD-10-CM | POA: Insufficient documentation

## 2014-06-15 DIAGNOSIS — Z792 Long term (current) use of antibiotics: Secondary | ICD-10-CM | POA: Diagnosis not present

## 2014-06-15 DIAGNOSIS — E860 Dehydration: Secondary | ICD-10-CM

## 2014-06-15 DIAGNOSIS — Z91040 Latex allergy status: Secondary | ICD-10-CM | POA: Diagnosis not present

## 2014-06-15 DIAGNOSIS — N189 Chronic kidney disease, unspecified: Secondary | ICD-10-CM | POA: Diagnosis not present

## 2014-06-15 DIAGNOSIS — Z794 Long term (current) use of insulin: Secondary | ICD-10-CM | POA: Insufficient documentation

## 2014-06-15 DIAGNOSIS — R112 Nausea with vomiting, unspecified: Secondary | ICD-10-CM

## 2014-06-15 DIAGNOSIS — R519 Headache, unspecified: Secondary | ICD-10-CM

## 2014-06-15 DIAGNOSIS — F410 Panic disorder [episodic paroxysmal anxiety] without agoraphobia: Secondary | ICD-10-CM | POA: Diagnosis not present

## 2014-06-15 DIAGNOSIS — D649 Anemia, unspecified: Secondary | ICD-10-CM | POA: Diagnosis not present

## 2014-06-15 DIAGNOSIS — Z79899 Other long term (current) drug therapy: Secondary | ICD-10-CM | POA: Insufficient documentation

## 2014-06-15 DIAGNOSIS — Z7952 Long term (current) use of systemic steroids: Secondary | ICD-10-CM | POA: Diagnosis not present

## 2014-06-15 DIAGNOSIS — Z87891 Personal history of nicotine dependence: Secondary | ICD-10-CM | POA: Insufficient documentation

## 2014-06-15 DIAGNOSIS — Z8619 Personal history of other infectious and parasitic diseases: Secondary | ICD-10-CM | POA: Insufficient documentation

## 2014-06-15 DIAGNOSIS — R51 Headache: Secondary | ICD-10-CM | POA: Diagnosis not present

## 2014-06-15 DIAGNOSIS — Z8719 Personal history of other diseases of the digestive system: Secondary | ICD-10-CM | POA: Diagnosis not present

## 2014-06-15 DIAGNOSIS — E119 Type 2 diabetes mellitus without complications: Secondary | ICD-10-CM | POA: Diagnosis not present

## 2014-06-15 LAB — BASIC METABOLIC PANEL
ANION GAP: 9 (ref 5–15)
BUN: 13 mg/dL (ref 6–23)
CALCIUM: 9 mg/dL (ref 8.4–10.5)
CHLORIDE: 105 mmol/L (ref 96–112)
CO2: 23 mmol/L (ref 19–32)
CREATININE: 0.86 mg/dL (ref 0.50–1.10)
GFR calc non Af Amer: 87 mL/min — ABNORMAL LOW (ref >90)
Glucose, Bld: 245 mg/dL — ABNORMAL HIGH (ref 70–99)
POTASSIUM: 3.8 mmol/L (ref 3.5–5.1)
Sodium: 137 mmol/L (ref 135–145)

## 2014-06-15 LAB — CBG MONITORING, ED: Glucose-Capillary: 237 mg/dL — ABNORMAL HIGH (ref 70–99)

## 2014-06-15 LAB — URINALYSIS, ROUTINE W REFLEX MICROSCOPIC
Bilirubin Urine: NEGATIVE
Glucose, UA: >1000 mg/dL — AB
HGB URINE DIPSTICK: NEGATIVE
Ketones, ur: NEGATIVE mg/dL
Leukocytes, UA: NEGATIVE
Nitrite: NEGATIVE
PH: 7 (ref 5.0–8.0)
PROTEIN: NEGATIVE mg/dL
Specific Gravity, Urine: >1.03 — ABNORMAL HIGH (ref 1.005–1.030)
Urobilinogen, UA: 0.2 mg/dL (ref 0.0–1.0)

## 2014-06-15 LAB — CBC WITH DIFFERENTIAL/PLATELET
Basophils Absolute: 0 10*3/uL (ref 0.0–0.1)
Basophils Relative: 0 % (ref 0–1)
Eosinophils Absolute: 0.1 10*3/uL (ref 0.0–0.7)
Eosinophils Relative: 1 % (ref 0–5)
HEMATOCRIT: 35.1 % — AB (ref 36.0–46.0)
HEMOGLOBIN: 12.2 g/dL (ref 12.0–15.0)
Lymphocytes Relative: 41 % (ref 12–46)
Lymphs Abs: 2.3 10*3/uL (ref 0.7–4.0)
MCH: 31 pg (ref 26.0–34.0)
MCHC: 34.8 g/dL (ref 30.0–36.0)
MCV: 89.3 fL (ref 78.0–100.0)
MONO ABS: 0.5 10*3/uL (ref 0.1–1.0)
MONOS PCT: 8 % (ref 3–12)
Neutro Abs: 2.8 10*3/uL (ref 1.7–7.7)
Neutrophils Relative %: 50 % (ref 43–77)
Platelets: 272 10*3/uL (ref 150–400)
RBC: 3.93 MIL/uL (ref 3.87–5.11)
RDW: 12.5 % (ref 11.5–15.5)
WBC: 5.6 10*3/uL (ref 4.0–10.5)

## 2014-06-15 LAB — URINE MICROSCOPIC-ADD ON

## 2014-06-15 MED ORDER — KETOROLAC TROMETHAMINE 15 MG/ML IJ SOLN
15.0000 mg | Freq: Once | INTRAMUSCULAR | Status: AC
Start: 2014-06-15 — End: 2014-06-15
  Administered 2014-06-15: 15 mg via INTRAVENOUS
  Filled 2014-06-15: qty 1

## 2014-06-15 MED ORDER — SODIUM CHLORIDE 0.9 % IV BOLUS (SEPSIS)
1000.0000 mL | Freq: Once | INTRAVENOUS | Status: AC
  Administered 2014-06-15: 1000 mL via INTRAVENOUS

## 2014-06-15 MED ORDER — DIPHENHYDRAMINE HCL 50 MG/ML IJ SOLN
25.0000 mg | Freq: Once | INTRAMUSCULAR | Status: AC
  Administered 2014-06-15: 25 mg via INTRAVENOUS
  Filled 2014-06-15: qty 1

## 2014-06-15 MED ORDER — SODIUM CHLORIDE 0.9 % IV BOLUS (SEPSIS)
1000.0000 mL | Freq: Once | INTRAVENOUS | Status: AC
Start: 2014-06-15 — End: 2014-06-15
  Administered 2014-06-15: 1000 mL via INTRAVENOUS

## 2014-06-15 MED ORDER — BUTALBITAL-APAP-CAFFEINE 50-325-40 MG PO TABS: 1.0000 | ORAL_TABLET | Freq: Four times a day (QID) | ORAL | Status: DC | PRN

## 2014-06-15 MED ORDER — PROMETHAZINE HCL 25 MG RE SUPP: 25.0000 mg | Freq: Four times a day (QID) | RECTAL | Status: DC | PRN

## 2014-06-15 MED ORDER — PROCHLORPERAZINE EDISYLATE 5 MG/ML IJ SOLN
10.0000 mg | Freq: Once | INTRAMUSCULAR | Status: AC
  Administered 2014-06-15: 10 mg via INTRAVENOUS
  Filled 2014-06-15: qty 2

## 2014-06-15 NOTE — ED Notes (Signed)
Pt has had migraine since cataract surgery on Monday. States pain is so bad, it makes her throw up. Vision looks weird from lt eye.

## 2014-06-15 NOTE — ED Notes (Signed)
Bed: AT55 Expected date:  Expected time:  Means of arrival:  Comments: EMS 33yo migraine, hyperglycemia

## 2014-06-15 NOTE — ED Provider Notes (Signed)
CSN: [id number]     Arrival date & time 06/15/14  2027 History   First MD Initiated Contact with Patient 06/15/14 2037     No chief complaint on file.    (Consider location/radiation/quality/duration/timing/severity/associated sxs/prior Treatment) HPI   [name] is a 34 y.o. female with past medical history significant for brittle diabetes, DKA, gastroparesis complaining of global headache, described as aching and throbbing, which she rates it 8 out of 10 not alleviated by oxycodone. Patient states that she's had this headache ever since she had a left cataract removal by Dr. Carolynn Sayers on April 18. She has gone for postop visits and reports that there is no complications. States these headaches are typical for her prior migraine exacerbations. She does not follow with a neurologist. She reports associated symptoms of abdominal pain and nonbloody, nonbilious, non-coffee ground emesis. She's had 4 episodes today she's taken ODT Zofran at home with little relief. She denies fever, chills, change in bowel or bladder habits, chest pain, shortness of breath, dysarthria, ataxia, cervicalgia. Patient notes that she took her last dose of Abx for C diff infection yesterday.    Past Medical History  Diagnosis Date  . Diabetes mellitus   . Thyroid disease     hypothyroidism associated with pregnancy  . Back pain   . Panic attack   . PONV (postoperative nausea and vomiting)   . Anxiety   . GERD (gastroesophageal reflux disease)   . DM gastroparesis   . Chronic kidney disease   . Anemia   . Gastroparesis   . Colitis, Clostridium difficile    Past Surgical History  Procedure Laterality Date  . Laparoscopy      age 15  . Back surgery      age 37  . Tooth extraction    . Root canal  10/10/12   Family History  Problem Relation Age of Onset  . Diabetes Maternal Grandmother   . Cancer Maternal Grandfather     Small cell lung cancer  . Cancer Maternal Grandmother     Leukemia   History   Substance Use Topics  . Smoking status: Former Smoker    Quit date: 04/19/2000  . Smokeless tobacco: Former Systems developer  . Alcohol Use: Yes     Comment: RARE SIPS OF WINE   OB History    No data available     Review of Systems  10 systems reviewed and found to be negative, except as noted in the HPI.   Allergies  Scallops; Morphine and related; Latex; and Other  Home Medications   Prior to Admission medications   Medication Sig Start Date End Date Taking? Authorizing Provider  acetaminophen (TYLENOL) 500 MG tablet Take 500 mg by mouth every 6 (six) hours as needed for moderate pain or headache (headache and stomach pain).    Yes Historical Provider, MD  amitriptyline (ELAVIL) 50 MG tablet Take 100 mg by mouth at bedtime.    Yes Historical Provider, MD  cyanocobalamin 500 MCG tablet Take 500 mcg by mouth 2 (two) times daily.   Yes Historical Provider, MD  dicyclomine (BENTYL) 10 MG capsule Take 10 mg by mouth 4 (four) times daily -  before meals and at bedtime.   Yes Historical Provider, MD  gabapentin (NEURONTIN) 100 MG capsule Take 300 mg by mouth at bedtime.    Yes Historical Provider, MD  insulin aspart (NOVOLOG) 100 UNIT/ML injection Inject 0-20 Units into the skin 3 (three) times daily with meals. CBG < 70:  implement hypoglycemia protocol CBG > 400: call MD   CBG 70 - 120: 0 units CBG 121 - 150: 3 units CBG 151 - 200: 4 units CBG 201 - 250: 7 units CBG 251 - 300: 11 units CBG 301 - 350: 15 units CBG 351 - 400: 20 units CBG > 400: call MD 02/01/13  Yes Donita Brooks, NP  insulin glargine (LANTUS) 100 UNIT/ML injection Inject 0.15 mLs (15 Units total) into the skin every morning. 02/19/14  Yes Philemon Kingdom, MD  ketorolac (ACULAR) 0.4 % SOLN Place 1 drop into the left eye 4 (four) times daily. For 2 weeks post op (surgery on 4/18) 06/04/14  Yes Historical Provider, MD  metroNIDAZOLE (FLAGYL) 500 MG tablet Take 1 tablet (500 mg total) by mouth 3 (three) times daily. X 2 weeks  05/31/14  Yes Ripudeep Krystal Eaton, MD  Multiple Vitamin (MULTIVITAMIN WITH MINERALS) TABS Take 1 tablet by mouth every morning. Diabetic support vitamin   Yes Historical Provider, MD  ofloxacin (OCUFLOX) 0.3 % ophthalmic solution 1 drop 4 (four) times daily. For 1 week post op (surgery 4/18)   Yes Historical Provider, MD  ondansetron (ZOFRAN ODT) 4 MG disintegrating tablet Take 1 tablet (4 mg total) by mouth every 8 (eight) hours as needed for nausea or vomiting. 04/01/14  Yes Velvet Bathe, MD  oxyCODONE-acetaminophen (PERCOCET) 5-325 MG per tablet Take 1 tablet by mouth every 4 (four) hours as needed for severe pain. 06/11/14  Yes Tatyana Kirichenko, PA-C  prednisoLONE acetate (PRED FORTE) 1 % ophthalmic suspension Place 1 drop into the left eye 4 (four) times daily. 06/04/14  Yes Historical Provider, MD  butalbital-acetaminophen-caffeine (FIORICET) 50-325-40 MG per tablet Take 1 tablet by mouth every 6 (six) hours as needed for headache. 06/15/14   Elmyra Ricks Shay Bartoli, PA-C  glucagon (GLUCAGON EMERGENCY) 1 MG injection Inject 1 mg into the muscle once as needed (severe hypoglycemia). 02/19/14   Philemon Kingdom, MD  promethazine (PHENERGAN) 25 MG suppository Place 1 suppository (25 mg total) rectally every 6 (six) hours as needed for nausea or vomiting. 06/15/14   Elmyra Ricks Mandrell Vangilder, PA-C   BP 99/73 mmHg  Pulse 90  Temp(Src) 98.5 F (36.9 C) (Oral)  Resp 18  SpO2 100%  LMP 01/26/2014 Physical Exam  Constitutional: She is oriented to person, place, and time. She appears well-developed and well-nourished. No distress.  Frail  HENT:  Head: Normocephalic.  Mouth/Throat: Oropharynx is clear and moist.  Eyes: Conjunctivae and EOM are normal. Pupils are equal, round, and reactive to light.  Eyeglasses with the left glass taken out  Neck: Normal range of motion. Neck supple.  FROM to C-spine. Pt can touch chin to chest without discomfort. No TTP of midline cervical spine.   Cardiovascular: Normal rate, regular  rhythm and intact distal pulses.   Pulmonary/Chest: Effort normal and breath sounds normal. No stridor. No respiratory distress. She has no wheezes. She has no rales. She exhibits no tenderness.  Abdominal: Soft. Bowel sounds are normal. She exhibits no distension and no mass. There is tenderness. There is no rebound and no guarding.  Diffusely tender to palpation abdomen especially in the bilateral upper quadrants with no guarding or rebound  Musculoskeletal: Normal range of motion. She exhibits no edema or tenderness.  Neurological: She is alert and oriented to person, place, and time.  Follows commands, Clear, goal oriented speech, Strength is 5 out of 5x4 extremities, patient ambulates with a coordinated in nonantalgic gait. Sensation is grossly intact.   Psychiatric: She  has a normal mood and affect.  Nursing note and vitals reviewed.   ED Course  Procedures (including critical care time) Labs Review Labs Reviewed  CBC WITH DIFFERENTIAL/PLATELET - Abnormal; Notable for the following:    HCT 35.1 (*)    All other components within normal limits  BASIC METABOLIC PANEL - Abnormal; Notable for the following:    Glucose, Bld 245 (*)    GFR calc non Af Amer 87 (*)    All other components within normal limits  URINALYSIS, ROUTINE W REFLEX MICROSCOPIC - Abnormal; Notable for the following:    APPearance CLOUDY (*)    Specific Gravity, Urine >1.030 (*)    Glucose, UA >1000 (*)    All other components within normal limits  CBG MONITORING, ED - Abnormal; Notable for the following:    Glucose-Capillary 237 (*)    All other components within normal limits  URINE MICROSCOPIC-ADD ON    Imaging Review No results found.   EKG Interpretation None      MDM   Final diagnoses:  Nonintractable headache, unspecified chronicity pattern, unspecified headache type  Dehydration  Non-intractable vomiting with nausea, vomiting of unspecified type    Filed Vitals:   06/15/14 2037  BP:  99/73  Pulse: 90  Temp: 98.5 F (36.9 C)  TempSrc: Oral  Resp: 18  SpO2: 100%    Medications  ketorolac (TORADOL) 15 MG/ML injection 15 mg (not administered)  sodium chloride 0.9 % bolus 1,000 mL (not administered)  sodium chloride 0.9 % bolus 1,000 mL (1,000 mLs Intravenous New Bag/Given 06/15/14 2151)  prochlorperazine (COMPAZINE) injection 10 mg (10 mg Intravenous Given 06/15/14 2151)  diphenhydrAMINE (BENADRYL) injection 25 mg (25 mg Intravenous Given 06/15/14 2153)    [name] is a pleasant 34 y.o. female presenting with headache and several episodes of emesis. Patient is concerned that she may be in DKA. Sugar is elevated but there is no anion gap and no ketones in the urine. Presentation is like pts typical HA and non concerning for [facility], ICH, Meningitis, or temporal arteritis. Pt is afebrile with no focal neuro deficits, nuchal rigidity, or change in vision. Pt is to follow up with PCP to discuss prophylactic medication. Pt verbalizes understanding and is agreeable with plan to dc.  Evaluation does not show pathology that would require ongoing emergent intervention or inpatient treatment. Pt is hemodynamically stable and mentating appropriately. Discussed findings and plan with patient/guardian, who agrees with care plan. All questions answered. Return precautions discussed and outpatient follow up given.   New Prescriptions   BUTALBITAL-ACETAMINOPHEN-CAFFEINE (FIORICET) 50-325-40 MG PER TABLET    Take 1 tablet by mouth every 6 (six) hours as needed for headache.   PROMETHAZINE (PHENERGAN) 25 MG SUPPOSITORY    Place 1 suppository (25 mg total) rectally every 6 (six) hours as needed for nausea or vomiting.         Monico Blitz, PA-C 06/15/14 2344  Evelina Bucy, MD 06/15/14 [phone]

## 2014-06-15 NOTE — ED Notes (Signed)
Per EMS c/o Migraine, N/V since Monday, Intermittant. At [facility] on last Monday For DM. Todaty her CBG was 625, she took Novalog 10u. Level in route is 244. BP was 80/46 P:116 in route. Pt finished ATB for C-Diff today. She has had some significant weight loss, 130lb to 90lbs. She also states with migraine, she has some black spots and intermittantly losses vision and has tunnel vision. In route, pt has had Zofran 8mg , and 783ml NS. BP:108/82 P:104  SpO2: 100% RA

## 2014-06-15 NOTE — Discharge Instructions (Signed)
Please follow with your primary care doctor in the next 2 days for a check-up. They must obtain records for further management.  ° °Do not hesitate to return to the Emergency Department for any new, worsening or concerning symptoms.  ° °

## 2014-06-19 ENCOUNTER — Inpatient Hospital Stay (HOSPITAL_COMMUNITY): Payer: Medicaid Other

## 2014-06-19 ENCOUNTER — Inpatient Hospital Stay (HOSPITAL_COMMUNITY)
Admission: EM | Admit: 2014-06-19 | Discharge: 2014-07-06 | DRG: 074 | Disposition: A | Discharge status: Home / Self Care | Payer: Medicaid Other | Attending: Internal Medicine | Admitting: Internal Medicine

## 2014-06-19 ENCOUNTER — Encounter (HOSPITAL_COMMUNITY): Payer: Self-pay | Admitting: Nurse Practitioner

## 2014-06-19 DIAGNOSIS — Z9114 Patient's other noncompliance with medication regimen: Secondary | ICD-10-CM | POA: Diagnosis present

## 2014-06-19 DIAGNOSIS — E46 Unspecified protein-calorie malnutrition: Secondary | ICD-10-CM | POA: Diagnosis present

## 2014-06-19 DIAGNOSIS — Z681 Body mass index (BMI) 19 or less, adult: Secondary | ICD-10-CM

## 2014-06-19 DIAGNOSIS — E872 Acidosis: Secondary | ICD-10-CM | POA: Diagnosis not present

## 2014-06-19 DIAGNOSIS — K580 Irritable bowel syndrome with diarrhea: Secondary | ICD-10-CM | POA: Diagnosis present

## 2014-06-19 DIAGNOSIS — E1065 Type 1 diabetes mellitus with hyperglycemia: Secondary | ICD-10-CM

## 2014-06-19 DIAGNOSIS — R740 Nonspecific elevation of levels of transaminase and lactic acid dehydrogenase [LDH]: Secondary | ICD-10-CM | POA: Diagnosis present

## 2014-06-19 DIAGNOSIS — Z833 Family history of diabetes mellitus: Secondary | ICD-10-CM | POA: Diagnosis not present

## 2014-06-19 DIAGNOSIS — R197 Diarrhea, unspecified: Secondary | ICD-10-CM

## 2014-06-19 DIAGNOSIS — E10649 Type 1 diabetes mellitus with hypoglycemia without coma: Secondary | ICD-10-CM | POA: Diagnosis not present

## 2014-06-19 DIAGNOSIS — R1084 Generalized abdominal pain: Secondary | ICD-10-CM | POA: Diagnosis not present

## 2014-06-19 DIAGNOSIS — Z91199 Patient's noncompliance with other medical treatment and regimen due to unspecified reason: Secondary | ICD-10-CM

## 2014-06-19 DIAGNOSIS — D638 Anemia in other chronic diseases classified elsewhere: Secondary | ICD-10-CM | POA: Diagnosis present

## 2014-06-19 DIAGNOSIS — Z79899 Other long term (current) drug therapy: Secondary | ICD-10-CM | POA: Diagnosis not present

## 2014-06-19 DIAGNOSIS — Z794 Long term (current) use of insulin: Secondary | ICD-10-CM | POA: Diagnosis not present

## 2014-06-19 DIAGNOSIS — Z801 Family history of malignant neoplasm of trachea, bronchus and lung: Secondary | ICD-10-CM

## 2014-06-19 DIAGNOSIS — N189 Chronic kidney disease, unspecified: Secondary | ICD-10-CM | POA: Diagnosis present

## 2014-06-19 DIAGNOSIS — Z9119 Patient's noncompliance with other medical treatment and regimen: Secondary | ICD-10-CM

## 2014-06-19 DIAGNOSIS — E1143 Type 2 diabetes mellitus with diabetic autonomic (poly)neuropathy: Secondary | ICD-10-CM | POA: Diagnosis not present

## 2014-06-19 DIAGNOSIS — K3184 Gastroparesis: Secondary | ICD-10-CM | POA: Diagnosis present

## 2014-06-19 DIAGNOSIS — R112 Nausea with vomiting, unspecified: Secondary | ICD-10-CM | POA: Diagnosis not present

## 2014-06-19 DIAGNOSIS — E1043 Type 1 diabetes mellitus with diabetic autonomic (poly)neuropathy: Secondary | ICD-10-CM | POA: Diagnosis present

## 2014-06-19 DIAGNOSIS — R109 Unspecified abdominal pain: Secondary | ICD-10-CM

## 2014-06-19 DIAGNOSIS — Z87891 Personal history of nicotine dependence: Secondary | ICD-10-CM

## 2014-06-19 DIAGNOSIS — K219 Gastro-esophageal reflux disease without esophagitis: Secondary | ICD-10-CM | POA: Diagnosis present

## 2014-06-19 LAB — BASIC METABOLIC PANEL
Anion gap: 6 (ref 5–15)
BUN: 12 mg/dL (ref 6–23)
CHLORIDE: 106 mmol/L (ref 96–112)
CO2: 19 mmol/L (ref 19–32)
Calcium: 8.9 mg/dL (ref 8.4–10.5)
Creatinine, Ser: 0.55 mg/dL (ref 0.50–1.10)
GFR calc Af Amer: >90 mL/min (ref >90)
GLUCOSE: 240 mg/dL — AB (ref 70–99)
Potassium: 4.1 mmol/L (ref 3.5–5.1)
Sodium: 131 mmol/L — ABNORMAL LOW (ref 135–145)

## 2014-06-19 LAB — CBC WITH DIFFERENTIAL/PLATELET
Basophils Absolute: 0 10*3/uL (ref 0.0–0.1)
Basophils Relative: 0 % (ref 0–1)
EOS PCT: 1 % (ref 0–5)
Eosinophils Absolute: 0.1 10*3/uL (ref 0.0–0.7)
HCT: 42.8 % (ref 36.0–46.0)
Hemoglobin: 14.6 g/dL (ref 12.0–15.0)
LYMPHS ABS: 2.8 10*3/uL (ref 0.7–4.0)
Lymphocytes Relative: 33 % (ref 12–46)
MCH: 30.2 pg (ref 26.0–34.0)
MCHC: 34.1 g/dL (ref 30.0–36.0)
MCV: 88.4 fL (ref 78.0–100.0)
Monocytes Absolute: 0.7 10*3/uL (ref 0.1–1.0)
Monocytes Relative: 8 % (ref 3–12)
NEUTROS PCT: 58 % (ref 43–77)
Neutro Abs: 4.8 10*3/uL (ref 1.7–7.7)
PLATELETS: 330 10*3/uL (ref 150–400)
RBC: 4.84 MIL/uL (ref 3.87–5.11)
RDW: 12.5 % (ref 11.5–15.5)
WBC: 8.3 10*3/uL (ref 4.0–10.5)

## 2014-06-19 LAB — PREGNANCY, URINE: Preg Test, Ur: NEGATIVE

## 2014-06-19 LAB — I-STAT CHEM 8, ED
BUN: 11 mg/dL (ref 6–23)
CREATININE: 0.6 mg/dL (ref 0.50–1.10)
Calcium, Ion: 1.17 mmol/L (ref 1.12–1.23)
Chloride: 102 mmol/L (ref 96–112)
GLUCOSE: 229 mg/dL — AB (ref 70–99)
HCT: 46 % (ref 36.0–46.0)
Hemoglobin: 15.6 g/dL — ABNORMAL HIGH (ref 12.0–15.0)
POTASSIUM: 4.1 mmol/L (ref 3.5–5.1)
Sodium: 136 mmol/L (ref 135–145)
TCO2: 17 mmol/L (ref 0–100)

## 2014-06-19 MED ORDER — FENTANYL CITRATE (PF) 100 MCG/2ML IJ SOLN
100.0000 ug | Freq: Once | INTRAMUSCULAR | Status: AC
  Administered 2014-06-19: 100 ug via INTRAVENOUS
  Filled 2014-06-19: qty 2

## 2014-06-19 MED ORDER — SODIUM CHLORIDE 0.9 % IV BOLUS (SEPSIS)
1000.0000 mL | Freq: Once | INTRAVENOUS | Status: AC
  Administered 2014-06-19: 1000 mL via INTRAVENOUS

## 2014-06-19 MED ORDER — ONDANSETRON HCL 4 MG/2ML IJ SOLN
4.0000 mg | Freq: Once | INTRAMUSCULAR | Status: AC
  Administered 2014-06-19: 4 mg via INTRAVENOUS
  Filled 2014-06-19: qty 2

## 2014-06-19 NOTE — H&P (Signed)
[facility] History and Physical  [name] QPR:[id number] DOB: 1980-03-16 DOA: 06/19/2014  Referring physician: Dr. Audie Pinto. ER physician. PCP: Mandy Jewel, MD   Chief Complaint: Diarrhea.  HPI: [name] is a 34 y.o. female with history of diabetes mellitus type 1 who was recently admitted for diabetic ketoacidosis and C. difficile colitis and was discharged home on Flagyl and presents to the ER because of worsening diarrhea over the last 24 hours. Patient states over the last 24 hours patient has been having severe diarrhea multiple locations denies any blood in the diarrhea. She also had a couple of episodes of nausea and vomiting. Patient also has been having crampy abdominal pain. Denies any sick contacts or recent travels. Patient states she did take her Lantus today but has not taken her sliding scale coverage as patient was not able to eat well because of the vomiting. Patient's blood sugar is elevated but not in DKA. Patient has been admitted for further management of her severe diarrhea. On exam patient abdomen appears soft and benign.   Review of Systems: As presented in the history of presenting illness, rest negative.  Past Medical History  Diagnosis Date  . Diabetes mellitus   . Thyroid disease     hypothyroidism associated with pregnancy  . Back pain   . Panic attack   . PONV (postoperative nausea and vomiting)   . Anxiety   . GERD (gastroesophageal reflux disease)   . DM gastroparesis   . Chronic kidney disease   . Anemia   . Gastroparesis   . Colitis, Clostridium difficile    Past Surgical History  Procedure Laterality Date  . Laparoscopy      age 37  . Back surgery      age 1  . Tooth extraction    . Root canal  10/10/12   Social History:  reports that she quit smoking about 14 years ago. She has quit using smokeless tobacco. She reports that she drinks alcohol. She reports that she does not use illicit drugs. Where does patient live  home. Can patient participate in ADLs? Yes.  Allergies  Allergen Reactions  . Scallops [Shellfish Allergy] Anaphylaxis  . Morphine And Related Itching  . Latex Rash  . Other Rash    sunscreen    Family History:  Family History  Problem Relation Age of Onset  . Diabetes Maternal Grandmother   . Cancer Maternal Grandfather     Small cell lung cancer  . Cancer Maternal Grandmother     Leukemia      Prior to Admission medications   Medication Sig Start Date End Date Taking? Authorizing Provider  acetaminophen (TYLENOL) 500 MG tablet Take 500 mg by mouth every 6 (six) hours as needed for moderate pain or headache (headache and stomach pain).    Yes Historical Provider, MD  amitriptyline (ELAVIL) 50 MG tablet Take 100 mg by mouth at bedtime.    Yes Historical Provider, MD  cyanocobalamin 500 MCG tablet Take 500 mcg by mouth 2 (two) times daily.   Yes Historical Provider, MD  dicyclomine (BENTYL) 10 MG capsule Take 10 mg by mouth 4 (four) times daily -  before meals and at bedtime.   Yes Historical Provider, MD  gabapentin (NEURONTIN) 100 MG capsule Take 300 mg by mouth at bedtime.    Yes Historical Provider, MD  glucagon (GLUCAGON EMERGENCY) 1 MG injection Inject 1 mg into the muscle once as needed (severe hypoglycemia). 02/19/14  Yes Philemon Kingdom, MD  insulin aspart (NOVOLOG) 100 UNIT/ML injection Inject 0-20 Units into the skin 3 (three) times daily with meals. CBG < 70: implement hypoglycemia protocol CBG > 400: call MD   CBG 70 - 120: 0 units CBG 121 - 150: 3 units CBG 151 - 200: 4 units CBG 201 - 250: 7 units CBG 251 - 300: 11 units CBG 301 - 350: 15 units CBG 351 - 400: 20 units CBG > 400: call MD 02/01/13  Yes Donita Brooks, NP  insulin glargine (LANTUS) 100 UNIT/ML injection Inject 0.15 mLs (15 Units total) into the skin every morning. 02/19/14  Yes Philemon Kingdom, MD  ketorolac (ACULAR) 0.4 % SOLN Place 1 drop into the left eye 4 (four) times daily. For 2 weeks  post op (surgery on 4/18) 06/04/14  Yes Historical Provider, MD  Multiple Vitamin (MULTIVITAMIN WITH MINERALS) TABS Take 1 tablet by mouth every morning. Diabetic support vitamin   Yes Historical Provider, MD  ofloxacin (OCUFLOX) 0.3 % ophthalmic solution 1 drop 4 (four) times daily. For 1 week post op (surgery 4/18)   Yes Historical Provider, MD  ondansetron (ZOFRAN-ODT) 8 MG disintegrating tablet Take 8 mg by mouth every 8 (eight) hours as needed for nausea or vomiting.   Yes Historical Provider, MD  prednisoLONE acetate (PRED FORTE) 1 % ophthalmic suspension Place 1 drop into the left eye 4 (four) times daily. 06/04/14  Yes Historical Provider, MD  butalbital-acetaminophen-caffeine (FIORICET) 50-325-40 MG per tablet Take 1 tablet by mouth every 6 (six) hours as needed for headache. Patient not taking: Reported on 06/19/2014 06/15/14   Elmyra Ricks Pisciotta, PA-C  metroNIDAZOLE (FLAGYL) 500 MG tablet Take 1 tablet (500 mg total) by mouth 3 (three) times daily. X 2 weeks Patient not taking: Reported on 06/19/2014 05/31/14   Ripudeep Krystal Eaton, MD  ondansetron (ZOFRAN ODT) 4 MG disintegrating tablet Take 1 tablet (4 mg total) by mouth every 8 (eight) hours as needed for nausea or vomiting. Patient not taking: Reported on 06/19/2014 04/01/14   Velvet Bathe, MD  oxyCODONE-acetaminophen (PERCOCET) 5-325 MG per tablet Take 1 tablet by mouth every 4 (four) hours as needed for severe pain. Patient not taking: Reported on 06/19/2014 06/11/14   Jeannett Senior, PA-C  promethazine (PHENERGAN) 25 MG suppository Place 1 suppository (25 mg total) rectally every 6 (six) hours as needed for nausea or vomiting. Patient not taking: Reported on 06/19/2014 06/15/14   Monico Blitz, PA-C    Physical Exam: Filed Vitals:   06/19/14 1938  BP: 113/83  Pulse: 115  Temp: 98.9 F (37.2 C)  TempSrc: Oral  Resp: 16  SpO2: 100%     General:  Ordered labile and poorly nourished.  Eyes: Anicteric no pallor.  ENT: No discharge  from the ears eyes nose and mouth.  Neck: No mass felt.  Cardiovascular: S1-S2 heard.  Respiratory: No rhonchi or crepitations.  Abdomen: Soft nontender bowel sounds present.  Skin: No rash.  Musculoskeletal: No edema.  Psychiatric: Appears normal.  Neurologic: Alert awake oriented to time place and person. Moves all extremities.  Labs on Admission:  Basic Metabolic Panel:  Recent Labs Lab 06/15/14 2109 06/19/14 1955 06/19/14 2106  NA 137 136 131*  K 3.8 4.1 4.1  CL 105 102 106  CO2 23  --  19  GLUCOSE 245* 229* 240*  BUN 13 11 12   CREATININE 0.86 0.60 0.55  CALCIUM 9.0  --  8.9   Liver Function Tests: No results for input(s): AST, ALT, ALKPHOS, BILITOT, PROT, ALBUMIN  in the last 168 hours. No results for input(s): LIPASE, AMYLASE in the last 168 hours. No results for input(s): AMMONIA in the last 168 hours. CBC:  Recent Labs Lab 06/15/14 2109 06/19/14 1955 06/19/14 2106  WBC 5.6  --  8.3  NEUTROABS 2.8  --  4.8  HGB 12.2 15.6* 14.6  HCT 35.1* 46.0 42.8  MCV 89.3  --  88.4  PLT 272  --  330   Cardiac Enzymes: No results for input(s): CKTOTAL, CKMB, CKMBINDEX, TROPONINI in the last 168 hours.  BNP (last 3 results) No results for input(s): BNP in the last 8760 hours.  ProBNP (last 3 results) No results for input(s): PROBNP in the last 8760 hours.  CBG:  Recent Labs Lab 06/15/14 2054  GLUCAP 237*    Radiological Exams on Admission: No results found.   Assessment/Plan Active Problems:   DM (diabetes mellitus), type 1, uncontrolled   Diarrhea   Nausea vomiting and diarrhea   1. Severe diarrhea with nausea and vomiting - I suspect patient has recurrence of her C. difficile. At this time I have ordered a C. difficile PCR and stool cultures. And place patient empirically on Flagyl for now. Continue with aggressive IV hydration and have placed patient on full liquid diet for now. Acute abdominal series is pending. 2. Diabetes mellitus type 1  uncontrolled not in DKA - patient did take her Lantus today morning. At this time we will closely follow CBGs with sliding scale coverage and continue her Lantus. Closely monitor CBGs as patient was recently in DKA.  Urine pregnancy and acute abdominal series are pending.   DVT Prophylaxis Lovenox.  Code Status: Full code.  Family Communication: Discussed with patient.  Disposition Plan: Admit to inpatient. Likely stay 2-3 days.    Juron Vorhees N. [facility] Pager [phone].  If 7PM-7AM, please contact night-coverage [email or web address] Password [facility] 06/19/2014, 10:49 PM

## 2014-06-19 NOTE — ED Notes (Signed)
Bed: WA04 Expected date:  Expected time:  Means of arrival:  Comments: diarrhea

## 2014-06-19 NOTE — ED Provider Notes (Signed)
CSN: [id number]     Arrival date & time 06/19/14  1931 History   First MD Initiated Contact with Patient 06/19/14 1941     Chief Complaint  Patient presents with  . Diarrhea      HPI Per EMS, pt complains of n/v/d for 2 days; pt states has gotten over the past 24 hours. Pt also complains of abdominal pain for the past 2 days. Pt states she feels generalized weakness. Pt states she took zofran this morning without relief. Pt dx'd with C-diff last month, pt states she completed her course of antibiotics. Pt received 44ml NS, 8mg  zofran en route to hospital. Pt has hx of type 1 diabetes, gastroparesis. CBG 171.  Past Medical History  Diagnosis Date  . Diabetes mellitus   . Thyroid disease     hypothyroidism associated with pregnancy  . Back pain   . Panic attack   . PONV (postoperative nausea and vomiting)   . Anxiety   . GERD (gastroesophageal reflux disease)   . DM gastroparesis   . Chronic kidney disease   . Anemia   . Gastroparesis   . Colitis, Clostridium difficile    Past Surgical History  Procedure Laterality Date  . Laparoscopy      age 34  . Back surgery      age 34  . Tooth extraction    . Root canal  10/10/12   Family History  Problem Relation Age of Onset  . Diabetes Maternal Grandmother   . Cancer Maternal Grandfather     Small cell lung cancer  . Cancer Maternal Grandmother     Leukemia   History  Substance Use Topics  . Smoking status: Former Smoker    Quit date: 04/19/2000  . Smokeless tobacco: Former Systems developer  . Alcohol Use: Yes     Comment: RARE SIPS OF WINE   OB History    No data available     Review of Systems  All other systems reviewed and are negative.     Allergies  Scallops; Morphine and related; Latex; and Other  Home Medications   Prior to Admission medications   Medication Sig Start Date End Date Taking? Authorizing Provider  acetaminophen (TYLENOL) 500 MG tablet Take 500 mg by mouth every 6 (six) hours as needed for moderate  pain or headache (headache and stomach pain).    Yes Historical Provider, MD  amitriptyline (ELAVIL) 50 MG tablet Take 100 mg by mouth at bedtime.    Yes Historical Provider, MD  cyanocobalamin 500 MCG tablet Take 500 mcg by mouth 2 (two) times daily.   Yes Historical Provider, MD  dicyclomine (BENTYL) 10 MG capsule Take 10 mg by mouth 4 (four) times daily -  before meals and at bedtime.   Yes Historical Provider, MD  gabapentin (NEURONTIN) 100 MG capsule Take 300 mg by mouth at bedtime.    Yes Historical Provider, MD  glucagon (GLUCAGON EMERGENCY) 1 MG injection Inject 1 mg into the muscle once as needed (severe hypoglycemia). 02/19/14  Yes Philemon Kingdom, MD  insulin aspart (NOVOLOG) 100 UNIT/ML injection Inject 0-20 Units into the skin 3 (three) times daily with meals. CBG < 70: implement hypoglycemia protocol CBG > 400: call MD   CBG 70 - 120: 0 units CBG 121 - 150: 3 units CBG 151 - 200: 4 units CBG 201 - 250: 7 units CBG 251 - 300: 11 units CBG 301 - 350: 15 units CBG 351 - 400: 20 units CBG > 400:  call MD 02/01/13  Yes Donita Brooks, NP  insulin glargine (LANTUS) 100 UNIT/ML injection Inject 0.15 mLs (15 Units total) into the skin every morning. 02/19/14  Yes Philemon Kingdom, MD  ketorolac (ACULAR) 0.4 % SOLN Place 1 drop into the left eye 4 (four) times daily. For 2 weeks post op (surgery on 4/18) 06/04/14  Yes Historical Provider, MD  Multiple Vitamin (MULTIVITAMIN WITH MINERALS) TABS Take 1 tablet by mouth every morning. Diabetic support vitamin   Yes Historical Provider, MD  ofloxacin (OCUFLOX) 0.3 % ophthalmic solution 1 drop 4 (four) times daily. For 1 week post op (surgery 4/18)   Yes Historical Provider, MD  ondansetron (ZOFRAN-ODT) 8 MG disintegrating tablet Take 8 mg by mouth every 8 (eight) hours as needed for nausea or vomiting.   Yes Historical Provider, MD  prednisoLONE acetate (PRED FORTE) 1 % ophthalmic suspension Place 1 drop into the left eye 4 (four) times daily.  06/04/14  Yes Historical Provider, MD  butalbital-acetaminophen-caffeine (FIORICET) 50-325-40 MG per tablet Take 1 tablet by mouth every 6 (six) hours as needed for headache. Patient not taking: Reported on 06/19/2014 06/15/14   Elmyra Ricks Pisciotta, PA-C  metroNIDAZOLE (FLAGYL) 500 MG tablet Take 1 tablet (500 mg total) by mouth 3 (three) times daily. X 2 weeks Patient not taking: Reported on 06/19/2014 05/31/14   Ripudeep Krystal Eaton, MD  ondansetron (ZOFRAN ODT) 4 MG disintegrating tablet Take 1 tablet (4 mg total) by mouth every 8 (eight) hours as needed for nausea or vomiting. Patient not taking: Reported on 06/19/2014 04/01/14   Velvet Bathe, MD  oxyCODONE-acetaminophen (PERCOCET) 5-325 MG per tablet Take 1 tablet by mouth every 4 (four) hours as needed for severe pain. Patient not taking: Reported on 06/19/2014 06/11/14   Jeannett Senior, PA-C  promethazine (PHENERGAN) 25 MG suppository Place 1 suppository (25 mg total) rectally every 6 (six) hours as needed for nausea or vomiting. Patient not taking: Reported on 06/19/2014 06/15/14   Elmyra Ricks Pisciotta, PA-C   BP 113/83 mmHg  Pulse 115  Temp(Src) 98.9 F (37.2 C) (Oral)  Resp 16  SpO2 100%  LMP 01/26/2014 Physical Exam  Constitutional: She is oriented to person, place, and time. She appears well-developed and well-nourished. No distress.  HENT:  Head: Normocephalic and atraumatic.  Eyes: Pupils are equal, round, and reactive to light.  Neck: Normal range of motion.  Cardiovascular: Normal rate and intact distal pulses.   Pulmonary/Chest: No respiratory distress.  Abdominal: Normal appearance. She exhibits no distension. There is tenderness. There is no rebound and no guarding.  Musculoskeletal: Normal range of motion.  Neurological: She is alert and oriented to person, place, and time. No cranial nerve deficit.  Skin: Skin is warm and dry. No rash noted.  Psychiatric: She has a normal mood and affect. Her behavior is normal.  Nursing note and  vitals reviewed.   ED Course  Procedures (including critical care time)  Medications  ondansetron (ZOFRAN) injection 4 mg (4 mg Intravenous Given 06/19/14 2103)  fentaNYL (SUBLIMAZE) injection 100 mcg (100 mcg Intravenous Given 06/19/14 2104)  sodium chloride 0.9 % bolus 1,000 mL (1,000 mLs Intravenous New Bag/Given 06/19/14 2104)    Labs Review Labs Reviewed  BASIC METABOLIC PANEL - Abnormal; Notable for the following:    Sodium 131 (*)    Glucose, Bld 240 (*)    All other components within normal limits  I-STAT CHEM 8, ED - Abnormal; Notable for the following:    Glucose, Bld 229 (*)  Hemoglobin 15.6 (*)    All other components within normal limits  CBC WITH DIFFERENTIAL/PLATELET  PREGNANCY, URINE  URINE RAPID DRUG SCREEN (HOSP PERFORMED)    Imaging Review No results found.    MDM   Final diagnoses:  Diarrhea        Leonard Schwartz, MD 06/19/14 2218

## 2014-06-19 NOTE — ED Notes (Addendum)
Per EMS, pt complains of n/v/d for 2 days; pt states has gotten over the past 24 hours. Pt also complains of abdominal pain for the past 2 days. Pt states she feels generalized weakness. Pt states she took zofran this morning without relief. Pt dx'd with C-diff last month, pt states she completed her course of antibiotics. Pt received 459ml NS, 8mg  zofran en route to hospital. Pt has hx of type 1 diabetes, gastroparesis. CBG 171.

## 2014-06-20 ENCOUNTER — Inpatient Hospital Stay (HOSPITAL_COMMUNITY): Payer: Medicaid Other

## 2014-06-20 ENCOUNTER — Ambulatory Visit: Payer: Medicaid Other | Admitting: Dietician

## 2014-06-20 DIAGNOSIS — E872 Acidosis: Secondary | ICD-10-CM

## 2014-06-20 LAB — GLUCOSE, CAPILLARY
GLUCOSE-CAPILLARY: 274 mg/dL — AB (ref 70–99)
Glucose-Capillary: 245 mg/dL — ABNORMAL HIGH (ref 70–99)
Glucose-Capillary: 296 mg/dL — ABNORMAL HIGH (ref 70–99)
Glucose-Capillary: 127 mg/dL — ABNORMAL HIGH (ref 70–99)

## 2014-06-20 LAB — CBC WITH DIFFERENTIAL/PLATELET
BASOS PCT: 0 % (ref 0–1)
Basophils Absolute: 0 10*3/uL (ref 0.0–0.1)
EOS ABS: 0.1 10*3/uL (ref 0.0–0.7)
Eosinophils Relative: 1 % (ref 0–5)
HEMATOCRIT: 34.9 % — AB (ref 36.0–46.0)
HEMOGLOBIN: 11.7 g/dL — AB (ref 12.0–15.0)
LYMPHS ABS: 3.2 10*3/uL (ref 0.7–4.0)
Lymphocytes Relative: 35 % (ref 12–46)
MCH: 30.1 pg (ref 26.0–34.0)
MCHC: 33.5 g/dL (ref 30.0–36.0)
MCV: 89.7 fL (ref 78.0–100.0)
Monocytes Absolute: 0.8 10*3/uL (ref 0.1–1.0)
Monocytes Relative: 9 % (ref 3–12)
NEUTROS PCT: 55 % (ref 43–77)
Neutro Abs: 5.1 10*3/uL (ref 1.7–7.7)
PLATELETS: 258 10*3/uL (ref 150–400)
RBC: 3.89 MIL/uL (ref 3.87–5.11)
RDW: 12.7 % (ref 11.5–15.5)
WBC: 9.1 10*3/uL (ref 4.0–10.5)

## 2014-06-20 LAB — BASIC METABOLIC PANEL
Anion gap: 7 (ref 5–15)
BUN: 11 mg/dL (ref 6–23)
CO2: 18 mmol/L — ABNORMAL LOW (ref 19–32)
CREATININE: 0.54 mg/dL (ref 0.50–1.10)
Calcium: 8.2 mg/dL — ABNORMAL LOW (ref 8.4–10.5)
Chloride: 105 mmol/L (ref 96–112)
GFR calc non Af Amer: >90 mL/min (ref >90)
Glucose, Bld: 251 mg/dL — ABNORMAL HIGH (ref 70–99)
POTASSIUM: 3.9 mmol/L (ref 3.5–5.1)
SODIUM: 130 mmol/L — AB (ref 135–145)

## 2014-06-20 LAB — RAPID URINE DRUG SCREEN, HOSP PERFORMED
Amphetamines: NOT DETECTED
BARBITURATES: NOT DETECTED
Benzodiazepines: NOT DETECTED
Cocaine: NOT DETECTED
Opiates: NOT DETECTED
Tetrahydrocannabinol: NOT DETECTED

## 2014-06-20 LAB — CLOSTRIDIUM DIFFICILE BY PCR: Toxigenic C. Difficile by PCR: NEGATIVE

## 2014-06-20 MED ORDER — PREDNISOLONE ACETATE 1 % OP SUSP
1.0000 [drp] | Freq: Four times a day (QID) | OPHTHALMIC | Status: DC
  Administered 2014-06-20 – 2014-07-06 (×65): 1 [drp] via OPHTHALMIC
  Filled 2014-06-20: qty 5

## 2014-06-20 MED ORDER — GLUCERNA SHAKE PO LIQD
237.0000 mL | Freq: Three times a day (TID) | ORAL | Status: DC
  Administered 2014-06-20 – 2014-07-06 (×41): 237 mL via ORAL
  Filled 2014-06-20 (×52): qty 237

## 2014-06-20 MED ORDER — GABAPENTIN 300 MG PO CAPS
300.0000 mg | ORAL_CAPSULE | Freq: Every day | ORAL | Status: DC
  Administered 2014-06-20 – 2014-07-05 (×16): 300 mg via ORAL
  Filled 2014-06-20 (×17): qty 1

## 2014-06-20 MED ORDER — METRONIDAZOLE IN NACL 5-0.79 MG/ML-% IV SOLN
500.0000 mg | Freq: Three times a day (TID) | INTRAVENOUS | Status: DC
  Administered 2014-06-20 – 2014-06-21 (×5): 500 mg via INTRAVENOUS
  Filled 2014-06-20 (×6): qty 100

## 2014-06-20 MED ORDER — ADULT MULTIVITAMIN W/MINERALS CH
1.0000 | ORAL_TABLET | Freq: Every morning | ORAL | Status: DC
Start: 2014-06-20 — End: 2014-07-06
  Administered 2014-06-20 – 2014-07-06 (×16): 1 via ORAL
  Filled 2014-06-20 (×17): qty 1

## 2014-06-20 MED ORDER — KETOROLAC TROMETHAMINE 0.5 % OP SOLN
1.0000 [drp] | Freq: Four times a day (QID) | OPHTHALMIC | Status: DC
  Administered 2014-06-20 – 2014-07-04 (×27): 1 [drp] via OPHTHALMIC
  Filled 2014-06-20: qty 3

## 2014-06-20 MED ORDER — INSULIN ASPART 100 UNIT/ML ~~LOC~~ SOLN
0.0000 [IU] | Freq: Three times a day (TID) | SUBCUTANEOUS | Status: DC
  Administered 2014-06-20: 3 [IU] via SUBCUTANEOUS
  Administered 2014-06-20: 5 [IU] via SUBCUTANEOUS

## 2014-06-20 MED ORDER — OXYCODONE-ACETAMINOPHEN 5-325 MG PO TABS
1.0000 | ORAL_TABLET | ORAL | Status: AC | PRN
  Administered 2014-06-20 – 2014-06-25 (×27): 1 via ORAL
  Filled 2014-06-20 (×28): qty 1

## 2014-06-20 MED ORDER — ACETAMINOPHEN 325 MG PO TABS
650.0000 mg | ORAL_TABLET | Freq: Four times a day (QID) | ORAL | Status: DC | PRN
  Administered 2014-06-30: 650 mg via ORAL
  Filled 2014-06-20: qty 2

## 2014-06-20 MED ORDER — ONDANSETRON 4 MG PO TBDP: 8.0000 mg | ORAL_TABLET | Freq: Three times a day (TID) | ORAL | Status: DC | PRN

## 2014-06-20 MED ORDER — ONDANSETRON HCL 4 MG/2ML IJ SOLN
4.0000 mg | Freq: Four times a day (QID) | INTRAMUSCULAR | Status: DC | PRN
  Administered 2014-06-22 – 2014-06-30 (×6): 4 mg via INTRAVENOUS
  Filled 2014-06-20 (×7): qty 2

## 2014-06-20 MED ORDER — FENTANYL CITRATE (PF) 100 MCG/2ML IJ SOLN
50.0000 ug | INTRAMUSCULAR | Status: DC | PRN
  Administered 2014-06-20 (×4): 50 ug via INTRAVENOUS
  Filled 2014-06-20 (×4): qty 2

## 2014-06-20 MED ORDER — MORPHINE SULFATE 2 MG/ML IJ SOLN
1.0000 mg | INTRAMUSCULAR | Status: DC | PRN
Start: 2014-06-20 — End: 2014-06-20

## 2014-06-20 MED ORDER — ONDANSETRON HCL 4 MG PO TABS
4.0000 mg | ORAL_TABLET | Freq: Four times a day (QID) | ORAL | Status: DC | PRN
  Administered 2014-06-22 – 2014-07-03 (×6): 4 mg via ORAL
  Filled 2014-06-20 (×7): qty 1

## 2014-06-20 MED ORDER — SODIUM CHLORIDE 0.9 % IV SOLN
INTRAVENOUS | Status: AC
  Administered 2014-06-20 (×2): via INTRAVENOUS

## 2014-06-20 MED ORDER — INSULIN ASPART 100 UNIT/ML ~~LOC~~ SOLN
0.0000 [IU] | Freq: Three times a day (TID) | SUBCUTANEOUS | Status: DC
  Administered 2014-06-20 – 2014-06-21 (×2): 8 [IU] via SUBCUTANEOUS
  Administered 2014-06-22: 3 [IU] via SUBCUTANEOUS
  Administered 2014-06-22: 4 [IU] via SUBCUTANEOUS
  Administered 2014-06-23: 5 [IU] via SUBCUTANEOUS
  Administered 2014-06-23 – 2014-06-24 (×2): 4 [IU] via SUBCUTANEOUS
  Administered 2014-06-24: 5 [IU] via SUBCUTANEOUS

## 2014-06-20 MED ORDER — INSULIN GLARGINE 100 UNIT/ML ~~LOC~~ SOLN
15.0000 [IU] | Freq: Every morning | SUBCUTANEOUS | Status: DC
  Administered 2014-06-20 – 2014-06-21 (×2): 15 [IU] via SUBCUTANEOUS
  Administered 2014-06-22: 10 [IU] via SUBCUTANEOUS
  Administered 2014-06-23: 15 [IU] via SUBCUTANEOUS
  Filled 2014-06-20 (×5): qty 0.15

## 2014-06-20 MED ORDER — OFLOXACIN 0.3 % OP SOLN
1.0000 [drp] | Freq: Four times a day (QID) | OPHTHALMIC | Status: DC
  Administered 2014-06-20 – 2014-07-04 (×17): 1 [drp] via OPHTHALMIC
  Filled 2014-06-20: qty 5

## 2014-06-20 MED ORDER — AMITRIPTYLINE HCL 100 MG PO TABS
100.0000 mg | ORAL_TABLET | Freq: Every day | ORAL | Status: DC
  Administered 2014-06-20 – 2014-07-05 (×17): 100 mg via ORAL
  Filled 2014-06-20 (×18): qty 1

## 2014-06-20 MED ORDER — DICYCLOMINE HCL 10 MG PO CAPS
10.0000 mg | ORAL_CAPSULE | Freq: Three times a day (TID) | ORAL | Status: DC
  Administered 2014-06-20 – 2014-07-06 (×65): 10 mg via ORAL
  Filled 2014-06-20 (×73): qty 1

## 2014-06-20 MED ORDER — ACETAMINOPHEN 650 MG RE SUPP: 650.0000 mg | Freq: Four times a day (QID) | RECTAL | Status: DC | PRN

## 2014-06-20 MED ORDER — CYANOCOBALAMIN 500 MCG PO TABS
500.0000 ug | ORAL_TABLET | Freq: Two times a day (BID) | ORAL | Status: DC
  Administered 2014-06-20 – 2014-07-06 (×31): 500 ug via ORAL
  Filled 2014-06-20 (×35): qty 1

## 2014-06-20 MED ORDER — ENOXAPARIN SODIUM 40 MG/0.4ML ~~LOC~~ SOLN
40.0000 mg | SUBCUTANEOUS | Status: DC
  Administered 2014-06-20 – 2014-06-23 (×4): 40 mg via SUBCUTANEOUS
  Filled 2014-06-20 (×5): qty 0.4

## 2014-06-20 NOTE — Care Management Note (Signed)
    Page 1 of 1   06/20/2014     11:26:43 AM CARE MANAGEMENT NOTE 06/20/2014  Patient:  RAGHAD, LORENZ A   Account Number:  [id number]  Date Initiated:  06/20/2014  Documentation initiated by:  [facility]  Subjective/Objective Assessment:   adm: Nausea vomiting and diarrhea     Action/Plan:   Anticipated DC Date:  06/22/2014   Anticipated DC Plan:  HOME/SELF CARE         Choice offered to / List presented to:             Status of service:  Completed, signed off Medicare Important Message given?   (If response is "NO", the following Medicare IM given date fields will be blank) Date Medicare IM given:   Medicare IM given by:   Date Additional Medicare IM given:   Additional Medicare IM given by:    Discharge Disposition:  HOME/SELF CARE  Per UR Regulation:  Reviewed for med. necessity/level of care/duration of stay  If discussed at [place] of Stay Meetings, dates discussed:    Comments:  06/20/14 09:00 CM notes pt is ind with all ADLs and no CM needs at this time.  Mariane Masters, BSN, CM [phone].

## 2014-06-20 NOTE — Progress Notes (Addendum)
Inpatient Diabetes Program Recommendations  AACE/ADA: New Consensus Statement on Inpatient Glycemic Control (2013)  Target Ranges:  Prepandial:   less than 140 mg/dL      Peak postprandial:   less than 180 mg/dL (1-2 hours)      Critically ill patients:  140 - 180 mg/dL   Reason for Visit: Hyperglycemia  Diabetes history: DM1 Outpatient Diabetes medications: Lantus 15 units QAM, Novolog s/s Current orders for Inpatient glycemic control: Lantus 15 units QAM, Novolog sensitive tidwc  34 year old female with Type 1 DM and gastroparesis, presents to ED with n,v and diarrhea. Familiar with pt from previous admissions.   Inpatient Diabetes Program Recommendations Insulin - Basal: Continue Lantus 15 units QAM Correction (SSI): Add HS correction Insulin - Meal Coverage: Consider addition of Novolog 3 units tid for meal coverage insulin. Recommend giving Novolog after meal since pt has gastroparesis. HgbA1C: Pending Diet: FL at present. When ready to advance - CHO mod med  Note: Will follow while inpatient. Thank you. Lorenda Peck, RD, LDN, CDE Inpatient Diabetes Coordinator [phone]

## 2014-06-20 NOTE — Progress Notes (Signed)
Initial Nutrition Assessment  DOCUMENTATION CODES:  Severe malnutrition in context of chronic illness  INTERVENTION:  Glucerna shake- TID  Diet advancement as medically tolerated per MD  NUTRITION DIAGNOSIS:  Malnutrition related to altered GI function as evidenced by severe depletion of body fat.  GOAL:  Weight gain  MONITOR:  PO intake, Supplement acceptance, Diet advancement, Labs, Weight trends  REASON FOR ASSESSMENT:  Other (Comment) (Low BMI)    ASSESSMENT: 34 y.o. female with history of diabetes mellitus type 1 who was recently admitted for diabetic ketoacidosis and C. difficile colitis and was discharged home on Flagyl and presents to the ER because of worsening diarrhea over the last 24 hours. Patient states over the last 24 hours patient has been having severe diarrhea multiple locations denies any blood in the diarrhea. She also had a couple of episodes of nausea and vomiting. Patient also has been having crampy abdominal pain. Denies any sick contacts or recent travels. Patient states she did take her Lantus today but has not taken her sliding scale coverage as patient was not able to eat well because of the vomiting. Patient's blood sugar is elevated but not in DKA.   Current diet is full liquids.  Pt reports that she is unable to eat enough due to gastroparesis.  She drinks low-sugar protein shakes that she purchases from [employer] on occasion. Pt had a nutritional shake with her and it was comparable to Glucerna Shakes.  Pt with questions about ways to gain weight and what types of food are best. Questions answered and pt provided with snack/meal ideas.  RD to order nutritional supplements.  Pt with no significant recent weight loss, but reports usual body weight last year as ~140 lbs.  Pt eating 100% of full liquid trays at this time. Would like to have her diet advanced when possible.   Labs and medications reviewed  CBGs 237-274  Na 130  Height:  Ht  Readings from Last 1 Encounters:  06/20/14 5\' 5"  (1.651 m)    Weight:  Wt Readings from Last 1 Encounters:  06/20/14 97 lb (44 kg)    Ideal Body Weight:  56.8 kg  Wt Readings from Last 10 Encounters:  06/20/14 97 lb (44 kg)  06/11/14 95 lb (43.092 kg)  05/28/14 99 lb 3.3 oz (45 kg)  05/09/14 100 lb (45.36 kg)  04/02/14 110 lb (49.896 kg)  03/30/14 101 lb 13.6 oz (46.2 kg)  03/25/14 106 lb (48.081 kg)  03/05/14 122 lb 9.2 oz (55.6 kg)  02/19/14 109 lb (49.442 kg)  02/03/14 123 lb 3.8 oz (55.9 kg)    BMI:  Body mass index is 16.14 kg/(m^2).  Estimated Nutritional Needs:  Kcal:  1400-1600  Protein:  80-90 g  Fluid:  1.6 L/day  Skin:     Diet Order:  Diet full liquid Room service appropriate?: Yes; Fluid consistency:: Thin  EDUCATION NEEDS:  Education needs addressed   Intake/Output Summary (Last 24 hours) at 06/20/14 1419 Last data filed at 06/20/14 0900  Gross per 24 hour  Intake 1340.42 ml  Output    100 ml  Net 1240.42 ml    Last BM:  Prior to admission  Laurette Schimke [place], [place], [place]

## 2014-06-20 NOTE — Progress Notes (Addendum)
PROGRESS NOTE  [name] HYI:[id number] DOB: Jan 07, 1981 DOA: 06/19/2014 PCP: [name], MD  Brief history 34 year old female with a history of diabetes mellitus type 1  who was recently discharged from the hospital on 05/31/2014 after an episode of DKA and Clostridium difficile colitis presents with 24 hours of nausea, vomiting, abdominal pain and diarrhea. Patient denied any hematochezia or melena. She denies any fevers or chills. She stated that she did not take Lantus for 24 hours prior to admission because of her nausea and vomiting and inability to eat or drink.  Acute abdominal series in the emergency department was unremarkable. The patient has CT of the abdomen and pelvis on 05/30/2014 with inflammatory stranding surrounding the rectum and distal sigmoid. Another C. difficile PCR was sent at the time of this admission. Assessment/Plan: Diarrhea -Patient claims to have had 10 bowel movements since arrival to the medical floor--this has not been corroborated with patient's nurse or nurse tech -I have kindly asked the patient to notify any staff with each and every bowel movement -C diff PCR--neg -stool lactoferrin -stool pathogen panel -suspect a degree of irritable bowel as pt is on Bentyl at home -am BMP, mag, phos N/V and abdominal pain -tolerating full liquids -lipase14 -pt stated she wants to stay on liquids today -repeat CT abdomen if no improvement -d/c IV opioids--will not restart unless there is objective evidence for worsening pain Diabetes mellitus type 1, poorly controlled -Patient's compliance is suspect -05/09/2014 hemoglobin A1c 11.8 -Continue NovoLog sliding scale, change to moderate scale Nongapped metabolic acidosis -likely due to diarrhea Transaminasemia -RUQ ultrasound in the setting of N/V and abdominal pain   Family Communication:   Mother updated at beside Disposition Plan:   Home when medically  stable       Procedures/Studies: Ct Abdomen Pelvis W Contrast  05/30/2014   CLINICAL DATA:  Abdomen pain, gastro paresis, weight loss and C diff infection.  EXAM: CT ABDOMEN AND PELVIS WITH CONTRAST  TECHNIQUE: Multidetector CT imaging of the abdomen and pelvis was performed using the standard protocol following bolus administration of intravenous contrast.  CONTRAST:  33mL OMNIPAQUE IOHEXOL 300 MG/ML  SOLN  COMPARISON:  February 15, 2014  FINDINGS: The liver, pancreas, adrenal glands and kidneys are normal. There are calcified granulomas within the spleen. The gallbladder is decompressed limiting evaluation. Minimal ascites is noted surrounding the liver. The aorta is normal in caliber. Minimal atherosclerosis of the aorta is noted. There is no abdominal lymphadenopathy.  The stomach is distended consistent with known gastroparesis. The small bowel is normal. Moderate bowel content is identified throughout colon. The appendix is normal. There is stranding and inflammation surrounding the rectum and distal portion of the sigmoid colon with small amount of fluid in the posterior pelvis and stranding in the presacral space.  There is mild diffuse bladder wall thickening with surrounding stranding. The uterus is normal. Small amount of free fluid is identified within the pelvis. The visualized lung bases are clear. No acute abnormality is identified within the visualized bones.  IMPRESSION: Stranding and inflammation surrounding the rectum and distal portion of sigmoid colon consistent with colitis.  Distended stomach consistent with known gastroparesis.  Minimal ascites in the abdomen.   Electronically Signed   By: Abelardo Diesel M.D.   On: 05/30/2014 15:10   Dg Abd Acute W/chest  06/20/2014   CLINICAL DATA:  Acute onset of nausea, vomiting and diarrhea. Generalized abdominal pain and weakness. Initial encounter.  EXAM: DG ABDOMEN ACUTE W/ 1V CHEST  COMPARISON:  Chest radiograph performed 03/03/2014, and CT  of the abdomen and pelvis from 05/30/2014  FINDINGS: The lungs are well-aerated and clear. There is no evidence of focal opacification, pleural effusion or pneumothorax. The cardiomediastinal silhouette is within normal limits. Bilateral metallic nipple piercings are noted. Clips are seen overlying the lung apices bilaterally.  The visualized bowel gas pattern is unremarkable. Scattered stool and air are seen within the colon; there is no evidence of small bowel dilatation to suggest obstruction. No free intra-abdominal air is identified on the provided upright view.  No acute osseous abnormalities are seen; the sacroiliac joints are unremarkable in appearance.  IMPRESSION: 1. Unremarkable bowel gas pattern; no free intra-abdominal air seen. Small amount of stool noted in the colon. 2. No acute cardiopulmonary process seen.   Electronically Signed   By: Garald Balding M.D.   On: 06/20/2014 00:00        Subjective: Patient states that she had 10 loose bowel movements since admission. She states abdominal pain is somewhat improved. No further vomiting. She is tolerating a full liquid diet. Denies fevers, chills, chest pain, shortness breath, hematochezia, melena. No dysuria or hematuria. No rashes or synovitis.  Objective: Filed Vitals:   06/19/14 1938 06/19/14 2300 06/20/14 0015 06/20/14 0438  BP: 113/83 110/73 130/60 102/71  Pulse: 115 109 100 83  Temp: 98.9 F (37.2 C)  98.1 F (36.7 C) 98.4 F (36.9 C)  TempSrc: Oral  Oral Oral  Resp: 16 14 16 16   Height:   5\' 5"  (1.651 m)   Weight:   44 kg (97 lb)   SpO2: 100% 100% 100% 100%    Intake/Output Summary (Last 24 hours) at 06/20/14 1330 Last data filed at 06/20/14 0900  Gross per 24 hour  Intake 1340.42 ml  Output    100 ml  Net 1240.42 ml   Weight change:  Exam:   General:  Pt is alert, follows commands appropriately, not in acute distress  HEENT: No icterus, No thrush, No neck mass, [place]/AT  Cardiovascular: RRR, S1/S2, no rubs,  no gallops  Respiratory: CTA bilaterally, no wheezing, no crackles, no rhonchi  Abdomen: Soft/+BS, diffusely tender without rebound, non distended, no guarding  Extremities: No edema, No lymphangitis, No petechiae, No rashes, no synovitis  Data Reviewed: Basic Metabolic Panel:  Recent Labs Lab 06/15/14 2109 06/19/14 1955 06/19/14 2106 06/20/14 0438  NA 137 136 131* 130*  K 3.8 4.1 4.1 3.9  CL 105 102 106 105  CO2 23  --  19 18*  GLUCOSE 245* 229* 240* 251*  BUN 13 11 12 11   CREATININE 0.86 0.60 0.55 0.54  CALCIUM 9.0  --  8.9 8.2*   Liver Function Tests: No results for input(s): AST, ALT, ALKPHOS, BILITOT, PROT, ALBUMIN in the last 168 hours. No results for input(s): LIPASE, AMYLASE in the last 168 hours. No results for input(s): AMMONIA in the last 168 hours. CBC:  Recent Labs Lab 06/15/14 2109 06/19/14 1955 06/19/14 2106 06/20/14 0438  WBC 5.6  --  8.3 9.1  NEUTROABS 2.8  --  4.8 5.1  HGB 12.2 15.6* 14.6 11.7*  HCT 35.1* 46.0 42.8 34.9*  MCV 89.3  --  88.4 89.7  PLT 272  --  330 258   Cardiac Enzymes: No results for input(s): CKTOTAL, CKMB, CKMBINDEX, TROPONINI in the last 168 hours. BNP: Invalid input(s): POCBNP CBG:  Recent Labs Lab 06/15/14 2054 06/20/14 0755 06/20/14 1225  GLUCAP  237* 274* 245*    Recent Results (from the past 240 hour(s))  Clostridium Difficile by PCR     Status: None   Collection Time: 06/20/14  3:40 AM  Result Value Ref Range Status   C difficile by pcr NEGATIVE NEGATIVE Final     Scheduled Meds: . amitriptyline  100 mg Oral QHS  . cyanocobalamin  500 mcg Oral BID  . dicyclomine  10 mg Oral TID AC & HS  . enoxaparin (LOVENOX) injection  40 mg Subcutaneous Q24H  . gabapentin  300 mg Oral QHS  . insulin aspart  0-15 Units Subcutaneous TID WC  . insulin glargine  15 Units Subcutaneous q morning - 10a  . ketorolac  1 drop Left Eye QID  . metronidazole  500 mg Intravenous Q8H  . multivitamin with minerals  1 tablet Oral  q morning - 10a  . ofloxacin  1 drop Left Eye QID  . prednisoLONE acetate  1 drop Left Eye QID   Continuous Infusions: . sodium chloride 125 mL/hr at 06/20/14 0055     Jill Ruppe, DO  [facility] Pager [phone]  If 7PM-7AM, please contact night-coverage [email or web address] Password [email or web address] 06/20/2014, 1:30 PM   LOS: 1 day

## 2014-06-21 ENCOUNTER — Inpatient Hospital Stay (HOSPITAL_COMMUNITY): Payer: Medicaid Other

## 2014-06-21 ENCOUNTER — Encounter (HOSPITAL_COMMUNITY): Payer: Self-pay | Admitting: Radiology

## 2014-06-21 DIAGNOSIS — R1084 Generalized abdominal pain: Secondary | ICD-10-CM

## 2014-06-21 LAB — CBC
HCT: 34 % — ABNORMAL LOW (ref 36.0–46.0)
HEMOGLOBIN: 11.7 g/dL — AB (ref 12.0–15.0)
MCH: 31 pg (ref 26.0–34.0)
MCHC: 34.4 g/dL (ref 30.0–36.0)
MCV: 90.2 fL (ref 78.0–100.0)
Platelets: 232 10*3/uL (ref 150–400)
RBC: 3.77 MIL/uL — ABNORMAL LOW (ref 3.87–5.11)
RDW: 12.7 % (ref 11.5–15.5)
WBC: 5.3 10*3/uL (ref 4.0–10.5)

## 2014-06-21 LAB — COMPREHENSIVE METABOLIC PANEL
ALT: 30 U/L (ref 0–35)
AST: 26 U/L (ref 0–37)
Albumin: 3.4 g/dL — ABNORMAL LOW (ref 3.5–5.2)
Alkaline Phosphatase: 55 U/L (ref 39–117)
Anion gap: 7 (ref 5–15)
BILIRUBIN TOTAL: 0.4 mg/dL (ref 0.3–1.2)
BUN: 6 mg/dL (ref 6–23)
CO2: 24 mmol/L (ref 19–32)
Calcium: 8.5 mg/dL (ref 8.4–10.5)
Chloride: 105 mmol/L (ref 96–112)
Creatinine, Ser: 0.51 mg/dL (ref 0.50–1.10)
GFR calc Af Amer: >90 mL/min (ref >90)
GFR calc non Af Amer: >90 mL/min (ref >90)
Glucose, Bld: 326 mg/dL — ABNORMAL HIGH (ref 70–99)
Potassium: 4.1 mmol/L (ref 3.5–5.1)
Sodium: 136 mmol/L (ref 135–145)
Total Protein: 5.8 g/dL — ABNORMAL LOW (ref 6.0–8.3)

## 2014-06-21 LAB — GLUCOSE, CAPILLARY
GLUCOSE-CAPILLARY: 315 mg/dL — AB (ref 70–99)
GLUCOSE-CAPILLARY: 316 mg/dL — AB (ref 70–99)
GLUCOSE-CAPILLARY: 155 mg/dL — AB (ref 70–99)
Glucose-Capillary: 271 mg/dL — ABNORMAL HIGH (ref 70–99)

## 2014-06-21 LAB — FECAL LACTOFERRIN, QUANT: FECAL LACTOFERRIN: NEGATIVE

## 2014-06-21 MED ORDER — INSULIN ASPART 100 UNIT/ML ~~LOC~~ SOLN
4.0000 [IU] | Freq: Three times a day (TID) | SUBCUTANEOUS | Status: DC
  Administered 2014-06-21 – 2014-06-23 (×2): 4 [IU] via SUBCUTANEOUS

## 2014-06-21 MED ORDER — METRONIDAZOLE 500 MG PO TABS
500.0000 mg | ORAL_TABLET | Freq: Three times a day (TID) | ORAL | Status: DC
  Administered 2014-06-21 – 2014-06-23 (×7): 500 mg via ORAL
  Filled 2014-06-21 (×11): qty 1

## 2014-06-21 MED ORDER — IOHEXOL 300 MG/ML  SOLN
80.0000 mL | Freq: Once | INTRAMUSCULAR | Status: AC | PRN
  Administered 2014-06-21: 80 mL via INTRAVENOUS

## 2014-06-21 MED ORDER — IOHEXOL 300 MG/ML  SOLN
25.0000 mL | INTRAMUSCULAR | Status: AC
  Administered 2014-06-21 (×2): 25 mL via ORAL

## 2014-06-21 MED ORDER — HYDROMORPHONE HCL 1 MG/ML IJ SOLN
1.0000 mg | Freq: Once | INTRAMUSCULAR | Status: AC
  Administered 2014-06-21: 1 mg via INTRAVENOUS
  Filled 2014-06-21: qty 1

## 2014-06-21 MED ORDER — SODIUM CHLORIDE 0.9 % IV SOLN
INTRAVENOUS | Status: AC
  Administered 2014-06-21: 125 mL/h via INTRAVENOUS
  Administered 2014-06-21: 01:00:00 via INTRAVENOUS
  Administered 2014-06-22: 125 mL/h via INTRAVENOUS

## 2014-06-21 NOTE — Progress Notes (Signed)
PROGRESS NOTE  [name] ELT:[id number] DOB: 10-Jun-1980 DOA: 06/19/2014 PCP: [name], MD   Brief history 34 year old female with a history of diabetes mellitus type 1 who was recently discharged from the hospital on 05/31/2014 after an episode of DKA and Clostridium difficile colitis presents with 24 hours of nausea, vomiting, abdominal pain and diarrhea. Patient denied any hematochezia or melena. She denies any fevers or chills. She stated that she did not take Lantus for 24 hours prior to admission because of her nausea and vomiting and inability to eat or drink. Acute abdominal series in the emergency department was unremarkable. The patient has CT of the abdomen and pelvis on 05/30/2014 with inflammatory stranding surrounding the rectum and distal sigmoid. Another C. difficile PCR was sent at the time of this admission which was negative. Stool lactoferrin was also negative. Assessment/Plan: Diarrhea -Patient claims to have had 10-20 bowel movements daily since arrival to the medical floor--this has not been corroborated with patient's nurse or nurse tech--RN reports 3BMs in past 24 hrs -I have kindly asked the patient to notify any staff with each and every bowel movement -C diff PCR--neg -stool lactoferrin--negative -stool pathogen panel--pending -suspect a degree of irritable bowel as pt is on Bentyl at home -06/21/2014 CT abdomen and pelvis--improving inflammatory changes, but still with mural thickening of the distal sigmoid and rectal areas--> question patient's compliance at home with Flagyl -Consult GI--spoke with Dr. Deatra Ina who will see pt on 4/30  N/V and abdominal pain -tolerating full liquids-->advance to soft diet -lipase14 -pt stated she wants to stay on liquids today -repeat CT abdomen if no improvement--as above -d/c IV opioids--will not restart unless there is objective evidence for worsening pain Diabetes mellitus type 1, poorly  controlled -Patient's compliance is suspect -05/09/2014 hemoglobin A1c 11.8 -Continue NovoLog sliding scale, change to moderate scale-->pt refusing insulin during hospitalization Nongapped metabolic acidosis -likely due to diarrhea Transaminasemia -resolved   Family Communication: No family at beside Disposition Plan: Home when medically stable   Procedures/Studies: Ct Abdomen Pelvis W Contrast  06/21/2014   CLINICAL DATA:  34 year old female with diarrhea, weight loss and gastric paresis. History of C difficile colitis.  EXAM: CT ABDOMEN AND PELVIS WITH CONTRAST  TECHNIQUE: Multidetector CT imaging of the abdomen and pelvis was performed using the standard protocol following bolus administration of intravenous contrast.  CONTRAST:  108mL OMNIPAQUE IOHEXOL 300 MG/ML  SOLN  COMPARISON:  CT of the abdomen and pelvis 05/30/2014.  FINDINGS: Lower chest:  Unremarkable.  Hepatobiliary: No cystic or solid hepatic lesions. No intra or extrahepatic biliary ductal dilatation. Gallbladder is normal in appearance.  Pancreas: Unremarkable.  Spleen: Several small splenic granulomas are noted.  Adrenals/Urinary Tract: Bilateral adrenal glands and bilateral kidneys are normal in appearance. No hydroureteronephrosis. Urinary bladder is normal in appearance.  Stomach/Bowel: Normal appearance of the stomach. No pathologic dilatation of small bowel or colon. There continues to be some mural thickening in the sigmoid colon and rectum, however, the surrounding haziness in the adjacent mesorectal and mesocolic fat noted on the prior study has largely resolved. The more proximal aspects of the colon are otherwise normal in appearance. Normal appendix.  Vascular/Lymphatic: No atherosclerotic disease or aneurysm identified in the abdominal or pelvic vasculature. No lymphadenopathy noted in the abdomen or pelvis.  Reproductive: Uterus and ovaries are unremarkable in appearance.  Other: No significant volume of ascites.  No  pneumoperitoneum.  Musculoskeletal: There are no aggressive appearing lytic or  blastic lesions noted in the visualized portions of the skeleton.  IMPRESSION: 1. Although the inflammatory changes around the sigmoid colon and rectum have largely resolved compared to the prior study, there appears to be some residual mural thickening throughout the distal sigmoid colon and rectum, which may indicate some residual colitis. This appears improved. The remainder the colon is otherwise normal in appearance.   Electronically Signed   By: Vinnie Langton M.D.   On: 06/21/2014 14:37   Ct Abdomen Pelvis W Contrast  05/30/2014   CLINICAL DATA:  Abdomen pain, gastro paresis, weight loss and C diff infection.  EXAM: CT ABDOMEN AND PELVIS WITH CONTRAST  TECHNIQUE: Multidetector CT imaging of the abdomen and pelvis was performed using the standard protocol following bolus administration of intravenous contrast.  CONTRAST:  2mL OMNIPAQUE IOHEXOL 300 MG/ML  SOLN  COMPARISON:  February 15, 2014  FINDINGS: The liver, pancreas, adrenal glands and kidneys are normal. There are calcified granulomas within the spleen. The gallbladder is decompressed limiting evaluation. Minimal ascites is noted surrounding the liver. The aorta is normal in caliber. Minimal atherosclerosis of the aorta is noted. There is no abdominal lymphadenopathy.  The stomach is distended consistent with known gastroparesis. The small bowel is normal. Moderate bowel content is identified throughout colon. The appendix is normal. There is stranding and inflammation surrounding the rectum and distal portion of the sigmoid colon with small amount of fluid in the posterior pelvis and stranding in the presacral space.  There is mild diffuse bladder wall thickening with surrounding stranding. The uterus is normal. Small amount of free fluid is identified within the pelvis. The visualized lung bases are clear. No acute abnormality is identified within the visualized bones.   IMPRESSION: Stranding and inflammation surrounding the rectum and distal portion of sigmoid colon consistent with colitis.  Distended stomach consistent with known gastroparesis.  Minimal ascites in the abdomen.   Electronically Signed   By: Abelardo Diesel M.D.   On: 05/30/2014 15:10   Dg Abd Acute W/chest  06/20/2014   CLINICAL DATA:  Acute onset of nausea, vomiting and diarrhea. Generalized abdominal pain and weakness. Initial encounter.  EXAM: DG ABDOMEN ACUTE W/ 1V CHEST  COMPARISON:  Chest radiograph performed 03/03/2014, and CT of the abdomen and pelvis from 05/30/2014  FINDINGS: The lungs are well-aerated and clear. There is no evidence of focal opacification, pleural effusion or pneumothorax. The cardiomediastinal silhouette is within normal limits. Bilateral metallic nipple piercings are noted. Clips are seen overlying the lung apices bilaterally.  The visualized bowel gas pattern is unremarkable. Scattered stool and air are seen within the colon; there is no evidence of small bowel dilatation to suggest obstruction. No free intra-abdominal air is identified on the provided upright view.  No acute osseous abnormalities are seen; the sacroiliac joints are unremarkable in appearance.  IMPRESSION: 1. Unremarkable bowel gas pattern; no free intra-abdominal air seen. Small amount of stool noted in the colon. 2. No acute cardiopulmonary process seen.   Electronically Signed   By: Garald Balding M.D.   On: 06/20/2014 00:00   US Abdomen Limited Ruq  06/20/2014   CLINICAL DATA:  Abdominal pain, right upper quadrant with nausea and vomiting and transaminitis.  EXAM: US ABDOMEN LIMITED - RIGHT UPPER QUADRANT  COMPARISON:  05/30/2014 abdominal CT  FINDINGS: Gallbladder:  No gallstones or wall thickening visualized. No sonographic Murphy sign noted.  Common bile duct:  Diameter: 3 mm  Liver:  No focal lesion identified. Within normal limits in  parenchymal echogenicity. Antegrade flow in the imaged portal venous  system.  IMPRESSION: Normal right upper quadrant ultrasound.   Electronically Signed   By: Monte Fantasia M.D.   On: 06/20/2014 19:50         Subjective: Patient continues to complain of abdominal pain and diarrhea. However this has not been corroborated by the nursing staff. She denies any vomiting, dysuria, hematuria, rashes, chest pain or shortness breath, coughing, hemoptysis.  Objective: Filed Vitals:   06/20/14 1431 06/20/14 2159 06/21/14 0500 06/21/14 1640  BP:  109/77 117/80 106/70  Pulse:  90 105 83  Temp: 97.7 F (36.5 C) 98.4 F (36.9 C) 98.3 F (36.8 C) 97.9 F (36.6 C)  TempSrc:  Oral Oral Oral  Resp:  16 16 16   Height:      Weight:      SpO2:  100% 100% 100%    Intake/Output Summary (Last 24 hours) at 06/21/14 1846 Last data filed at 06/21/14 1500  Gross per 24 hour  Intake 2384.59 ml  Output   2900 ml  Net -515.41 ml   Weight change:  Exam:   General:  Pt is alert, follows commands appropriately, not in acute distress  HEENT: No icterus, No thrush,  [place]/AT  Cardiovascular: RRR, S1/S2, no rubs, no gallops  Respiratory: CTA bilaterally, no wheezing, no crackles, no rhonchi  Abdomen: Soft/+BS, non tender when distracted, non distended, no guarding  Extremities: No edema, No lymphangitis, No petechiae, No rashes, no synovitis  Data Reviewed: Basic Metabolic Panel:  Recent Labs Lab 06/15/14 2109 06/19/14 1955 06/19/14 2106 06/20/14 0438 06/21/14 0415  NA 137 136 131* 130* 136  K 3.8 4.1 4.1 3.9 4.1  CL 105 102 106 105 105  CO2 23  --  19 18* 24  GLUCOSE 245* 229* 240* 251* 326*  BUN 13 11 12 11 6   CREATININE 0.86 0.60 0.55 0.54 0.51  CALCIUM 9.0  --  8.9 8.2* 8.5   Liver Function Tests:  Recent Labs Lab 06/21/14 0415  AST 26  ALT 30  ALKPHOS 55  BILITOT 0.4  PROT 5.8*  ALBUMIN 3.4*   No results for input(s): LIPASE, AMYLASE in the last 168 hours. No results for input(s): AMMONIA in the last 168 hours. CBC:  Recent  Labs Lab 06/15/14 2109 06/19/14 1955 06/19/14 2106 06/20/14 0438 06/21/14 0415  WBC 5.6  --  8.3 9.1 5.3  NEUTROABS 2.8  --  4.8 5.1  --   HGB 12.2 15.6* 14.6 11.7* 11.7*  HCT 35.1* 46.0 42.8 34.9* 34.0*  MCV 89.3  --  88.4 89.7 90.2  PLT 272  --  330 258 232   Cardiac Enzymes: No results for input(s): CKTOTAL, CKMB, CKMBINDEX, TROPONINI in the last 168 hours. BNP: Invalid input(s): POCBNP CBG:  Recent Labs Lab 06/20/14 1657 06/20/14 2157 06/21/14 0735 06/21/14 1121 06/21/14 1644  GLUCAP 296* 127* 315* 271* 316*    Recent Results (from the past 240 hour(s))  Clostridium Difficile by PCR     Status: None   Collection Time: 06/20/14  3:40 AM  Result Value Ref Range Status   C difficile by pcr NEGATIVE NEGATIVE Final  Stool culture     Status: None (Preliminary result)   Collection Time: 06/20/14  3:40 AM  Result Value Ref Range Status   Specimen Description STOOL  Final   Special Requests NONE  Final   Culture   Final    Culture reincubated for better growth Performed at [facility]  Report Status PENDING  Incomplete     Scheduled Meds: . amitriptyline  100 mg Oral QHS  . cyanocobalamin  500 mcg Oral BID  . dicyclomine  10 mg Oral TID AC & HS  . enoxaparin (LOVENOX) injection  40 mg Subcutaneous Q24H  . feeding supplement (GLUCERNA SHAKE)  237 mL Oral TID BM  . gabapentin  300 mg Oral QHS  . insulin aspart  0-15 Units Subcutaneous TID WC  . insulin aspart  4 Units Subcutaneous TID WC  . insulin glargine  15 Units Subcutaneous q morning - 10a  . ketorolac  1 drop Left Eye QID  . metroNIDAZOLE  500 mg Oral 3 times per day  . multivitamin with minerals  1 tablet Oral q morning - 10a  . ofloxacin  1 drop Left Eye QID  . prednisoLONE acetate  1 drop Left Eye QID   Continuous Infusions: . sodium chloride 125 mL/hr (06/21/14 1428)     Caron Tardif, DO  [facility] Pager [phone]  If 7PM-7AM, please contact  night-coverage [email or web address] Password [facility] 06/21/2014, 6:46 PM   LOS: 2 days

## 2014-06-21 NOTE — Progress Notes (Signed)
CT results reviewed.  Fat stranding is improved but still has rectosigmoid thickening on CT of abd/pelvis.  Pt still having abd pain and loose stool that she claims is not improved although nursing cannot corroborate that many stools.  Question whether pt was compliant with Flagyl after last d/c as she has had a hx of noncompliance.  Nevertheless, I will restart Flagyl.  Cdiff neg and stool lactoferrin neg this admission.  Stool pathogen panel neg.  I have consulted GI, spoke with Dr. Deatra Ina who will see pt on 4/30.  DTat

## 2014-06-22 DIAGNOSIS — Z9119 Patient's noncompliance with other medical treatment and regimen: Secondary | ICD-10-CM

## 2014-06-22 DIAGNOSIS — Z91199 Patient's noncompliance with other medical treatment and regimen due to unspecified reason: Secondary | ICD-10-CM

## 2014-06-22 LAB — CBC
HEMATOCRIT: 32.3 % — AB (ref 36.0–46.0)
Hemoglobin: 10.7 g/dL — ABNORMAL LOW (ref 12.0–15.0)
MCH: 30.1 pg (ref 26.0–34.0)
MCHC: 33.1 g/dL (ref 30.0–36.0)
MCV: 90.7 fL (ref 78.0–100.0)
Platelets: 221 10*3/uL (ref 150–400)
RBC: 3.56 MIL/uL — AB (ref 3.87–5.11)
RDW: 12.5 % (ref 11.5–15.5)
WBC: 4.5 10*3/uL (ref 4.0–10.5)

## 2014-06-22 LAB — GLUCOSE, CAPILLARY
GLUCOSE-CAPILLARY: 154 mg/dL — AB (ref 70–99)
GLUCOSE-CAPILLARY: 378 mg/dL — AB (ref 70–99)
Glucose-Capillary: 121 mg/dL — ABNORMAL HIGH (ref 70–99)
Glucose-Capillary: 293 mg/dL — ABNORMAL HIGH (ref 70–99)

## 2014-06-22 LAB — COMPREHENSIVE METABOLIC PANEL
ALK PHOS: 49 U/L (ref 39–117)
ALT: 31 U/L (ref 0–35)
AST: 27 U/L (ref 0–37)
Albumin: 3.1 g/dL — ABNORMAL LOW (ref 3.5–5.2)
Anion gap: 8 (ref 5–15)
BUN: 5 mg/dL — ABNORMAL LOW (ref 6–23)
CO2: 27 mmol/L (ref 19–32)
Calcium: 8.6 mg/dL (ref 8.4–10.5)
Chloride: 104 mmol/L (ref 96–112)
Creatinine, Ser: 0.48 mg/dL — ABNORMAL LOW (ref 0.50–1.10)
GFR calc Af Amer: >90 mL/min (ref >90)
Glucose, Bld: 196 mg/dL — ABNORMAL HIGH (ref 70–99)
POTASSIUM: 3.9 mmol/L (ref 3.5–5.1)
SODIUM: 139 mmol/L (ref 135–145)
Total Bilirubin: 0.3 mg/dL (ref 0.3–1.2)
Total Protein: 5.5 g/dL — ABNORMAL LOW (ref 6.0–8.3)

## 2014-06-22 MED ORDER — HYDROMORPHONE HCL 1 MG/ML IJ SOLN
1.0000 mg | Freq: Once | INTRAMUSCULAR | Status: AC
  Administered 2014-06-22: 1 mg via INTRAVENOUS
  Filled 2014-06-22: qty 1

## 2014-06-22 MED ORDER — RISAQUAD PO CAPS
1.0000 | ORAL_CAPSULE | Freq: Every day | ORAL | Status: DC
  Administered 2014-06-22 – 2014-06-27 (×5): 1 via ORAL
  Filled 2014-06-22 (×7): qty 1

## 2014-06-22 NOTE — Consult Note (Signed)
GI Consultation  Referring Provider: No ref. provider found Primary Care Physician:  [name], MD Primary Gastroenterologist:  Dr.  Luiz Iron for Consultation:  *Diarrhea*  HPI: [name] is a 34 y.o. female *with history of type 1 diabetes, gastroparesis, chronic kidney disease, admitted with diarrhea.  December she's been plagued with diarrhea.  In early April stools were positive for C. difficile toxin and she was placed on Flagyl.  While diarrhea improved somewhat it never entirely dissipated.  CT scan in early April, which I reviewed, demonstrated a diffuse sided colitis.  Upon discontinuing Flagyl approximately 6 days ago diarrhea has worsened.  She was admitted 3 days ago for diarrhea.  She complains of severe urgency and lower abdominal pain.  Stools for C. difficile difficile toxin and lactoferrin were negative.  Repeat CT scan demonstrated persistent but improved inflammatory changes in the left colon.**   Past Medical History  Diagnosis Date  . Diabetes mellitus   . Thyroid disease     hypothyroidism associated with pregnancy  . Back pain   . Panic attack   . PONV (postoperative nausea and vomiting)   . Anxiety   . GERD (gastroesophageal reflux disease)   . DM gastroparesis   . Chronic kidney disease   . Anemia   . Gastroparesis   . Colitis, Clostridium difficile     Past Surgical History  Procedure Laterality Date  . Laparoscopy      age 31  . Back surgery      age 85  . Tooth extraction    . Root canal  10/10/12    Prior to Admission medications   Medication Sig Start Date End Date Taking? Authorizing Provider  acetaminophen (TYLENOL) 500 MG tablet Take 500 mg by mouth every 6 (six) hours as needed for moderate pain or headache (headache and stomach pain).    Yes Historical Provider, MD  amitriptyline (ELAVIL) 50 MG tablet Take 100 mg by mouth at bedtime.    Yes Historical Provider, MD  cyanocobalamin 500 MCG tablet Take 500 mcg by mouth 2  (two) times daily.   Yes Historical Provider, MD  dicyclomine (BENTYL) 10 MG capsule Take 10 mg by mouth 4 (four) times daily -  before meals and at bedtime.   Yes Historical Provider, MD  gabapentin (NEURONTIN) 100 MG capsule Take 300 mg by mouth at bedtime.    Yes Historical Provider, MD  glucagon (GLUCAGON EMERGENCY) 1 MG injection Inject 1 mg into the muscle once as needed (severe hypoglycemia). 02/19/14  Yes Philemon Kingdom, MD  insulin aspart (NOVOLOG) 100 UNIT/ML injection Inject 0-20 Units into the skin 3 (three) times daily with meals. CBG < 70: implement hypoglycemia protocol CBG > 400: call MD   CBG 70 - 120: 0 units CBG 121 - 150: 3 units CBG 151 - 200: 4 units CBG 201 - 250: 7 units CBG 251 - 300: 11 units CBG 301 - 350: 15 units CBG 351 - 400: 20 units CBG > 400: call MD 02/01/13  Yes Donita Brooks, NP  insulin glargine (LANTUS) 100 UNIT/ML injection Inject 0.15 mLs (15 Units total) into the skin every morning. 02/19/14  Yes Philemon Kingdom, MD  ketorolac (ACULAR) 0.4 % SOLN Place 1 drop into the left eye 4 (four) times daily. For 2 weeks post op (surgery on 4/18) 06/04/14  Yes Historical Provider, MD  Multiple Vitamin (MULTIVITAMIN WITH MINERALS) TABS Take 1 tablet by mouth every morning. Diabetic  support vitamin   Yes Historical Provider, MD  ofloxacin (OCUFLOX) 0.3 % ophthalmic solution 1 drop 4 (four) times daily. For 1 week post op (surgery 4/18)   Yes Historical Provider, MD  ondansetron (ZOFRAN-ODT) 8 MG disintegrating tablet Take 8 mg by mouth every 8 (eight) hours as needed for nausea or vomiting.   Yes Historical Provider, MD  prednisoLONE acetate (PRED FORTE) 1 % ophthalmic suspension Place 1 drop into the left eye 4 (four) times daily. 06/04/14  Yes Historical Provider, MD  butalbital-acetaminophen-caffeine (FIORICET) 50-325-40 MG per tablet Take 1 tablet by mouth every 6 (six) hours as needed for headache. Patient not taking: Reported on 06/19/2014 06/15/14   Elmyra Ricks  Pisciotta, PA-C  metroNIDAZOLE (FLAGYL) 500 MG tablet Take 1 tablet (500 mg total) by mouth 3 (three) times daily. X 2 weeks Patient not taking: Reported on 06/19/2014 05/31/14   Ripudeep Krystal Eaton, MD  ondansetron (ZOFRAN ODT) 4 MG disintegrating tablet Take 1 tablet (4 mg total) by mouth every 8 (eight) hours as needed for nausea or vomiting. Patient not taking: Reported on 06/19/2014 04/01/14   Velvet Bathe, MD  oxyCODONE-acetaminophen (PERCOCET) 5-325 MG per tablet Take 1 tablet by mouth every 4 (four) hours as needed for severe pain. Patient not taking: Reported on 06/19/2014 06/11/14   Jeannett Senior, PA-C  promethazine (PHENERGAN) 25 MG suppository Place 1 suppository (25 mg total) rectally every 6 (six) hours as needed for nausea or vomiting. Patient not taking: Reported on 06/19/2014 06/15/14   Elmyra Ricks Pisciotta, PA-C    Current Facility-Administered Medications  Medication Dose Route Frequency Provider Last Rate Last Dose  . 0.9 %  sodium chloride infusion   Intravenous Continuous Orson Eva, MD 125 mL/hr at 06/21/14 1428 125 mL/hr at 06/21/14 1428  . acetaminophen (TYLENOL) tablet 650 mg  650 mg Oral Q6H PRN Rise Patience, MD       Or  . acetaminophen (TYLENOL) suppository 650 mg  650 mg Rectal Q6H PRN Rise Patience, MD      . amitriptyline (ELAVIL) tablet 100 mg  100 mg Oral QHS Rise Patience, MD   100 mg at 06/21/14 2149  . cyanocobalamin tablet 500 mcg  500 mcg Oral BID Rise Patience, MD   500 mcg at 06/21/14 2157  . dicyclomine (BENTYL) capsule 10 mg  10 mg Oral TID AC & HS Rise Patience, MD   10 mg at 06/22/14 3729  . enoxaparin (LOVENOX) injection 40 mg  40 mg Subcutaneous Q24H Rise Patience, MD   40 mg at 06/21/14 0901  . feeding supplement (GLUCERNA SHAKE) (GLUCERNA SHAKE) liquid 237 mL  237 mL Oral TID BM Dorann Ou, RD   237 mL at 06/21/14 2000  . gabapentin (NEURONTIN) capsule 300 mg  300 mg Oral QHS Rise Patience, MD   300 mg at  06/21/14 2150  . insulin aspart (novoLOG) injection 0-15 Units  0-15 Units Subcutaneous TID WC Orson Eva, MD   3 Units at 06/22/14 [phone]  . insulin aspart (novoLOG) injection 4 Units  4 Units Subcutaneous TID WC Orson Eva, MD   4 Units at 06/21/14 1716  . insulin glargine (LANTUS) injection 15 Units  15 Units Subcutaneous q morning - 10a Rise Patience, MD   15 Units at 06/21/14 0901  . ketorolac (ACULAR) 0.5 % ophthalmic solution 1 drop  1 drop Left Eye QID Rise Patience, MD   1 drop at 06/21/14 2158  . metroNIDAZOLE (FLAGYL) tablet  500 mg  500 mg Oral 3 times per day Orson Eva, MD   500 mg at 06/22/14 0700  . multivitamin with minerals tablet 1 tablet  1 tablet Oral q morning - 10a Rise Patience, MD   1 tablet at 06/21/14 1419  . ofloxacin (OCUFLOX) 0.3 % ophthalmic solution 1 drop  1 drop Left Eye QID Rise Patience, MD   1 drop at 06/21/14 2159  . ondansetron (ZOFRAN) tablet 4 mg  4 mg Oral Q6H PRN Rise Patience, MD       Or  . ondansetron [facility]) injection 4 mg  4 mg Intravenous Q6H PRN Rise Patience, MD      . oxyCODONE-acetaminophen (PERCOCET/ROXICET) 5-325 MG per tablet 1 tablet  1 tablet Oral Q4H PRN Rise Patience, MD   1 tablet at 06/22/14 0701  . prednisoLONE acetate (PRED FORTE) 1 % ophthalmic suspension 1 drop  1 drop Left Eye QID Rise Patience, MD   1 drop at 06/21/14 2159    Allergies as of 06/19/2014 - Review Complete 06/19/2014  Allergen Reaction Noted  . Scallops [shellfish allergy] Anaphylaxis 04/20/2011  . Morphine and related Itching 04/20/2011  . Latex Rash 10/16/2012  . Other Rash 06/11/2014    Family History  Problem Relation Age of Onset  . Diabetes Maternal Grandmother   . Cancer Maternal Grandfather     Small cell lung cancer  . Cancer Maternal Grandmother     Leukemia    History   Social History  . Marital Status: Single    Spouse Name: N/A  . Number of Children: N/A  . Years of Education: N/A    Occupational History  . Not on file.   Social History Main Topics  . Smoking status: Former Smoker    Quit date: 04/19/2000  . Smokeless tobacco: Former Systems developer  . Alcohol Use: Yes     Comment: RARE SIPS OF WINE  . Drug Use: No  . Sexual Activity: No   Other Topics Concern  . Not on file   Social History Narrative   Single.  Lives with children.    Review of Systems: Pertinent positive and negative review of systems were noted in the above history of present illness section. All other review of systems were otherwise negative   Physical Exam: Vital signs in last 24 hours: Temp:  [97.9 F (36.6 C)-98.6 F (37 C)] 98.3 F (36.8 C) (04/30 0617) Pulse Rate:  [83-90] 87 (04/30 0617) Resp:  [16] 16 (04/30 0617) BP: (102-109)/(70-79) 109/75 mmHg (04/30 0617) SpO2:  [100 %] 100 % (04/30 0617)  General:   Alert,  Well-developed, well-nourished, pleasant and cooperative in NAD Head:  Normocephalic and atraumatic. Eyes:  Sclera clear, no icterus.   Conjunctiva pink. Ears:  Normal auditory acuity. Nose:  No deformity, discharge,  or lesions. Mouth:  No deformity or lesions.  Oropharynx pink & moist. Neck:  Supple; no masses or thyromegaly. Lymph Nodes: no lymphadenopathy Lungs:  Clear throughout to auscultation.   No wheezes, crackles, or rhonchi. No acute distress. Heart:  Regular rate and rhythm; no murmurs, clicks, rubs,  or gallops. Gastrointestinal:  Soft,  and nondistended. No masses, hepatosplenomegaly or hernias noted. Normal bowel sounds, without guarding, and without rebound.  There is moderate tenderness to palpation in the lower quadrants bilaterally Rectal:  Deferred until time of colonoscopy.   Msk:  Symmetrical without gross deformities. Normal posture. Pulses:  Normal pulses noted. Extremities:  Without clubbing or edema.  Neurologic:  Alert and  oriented x4;  grossly normal neurologically. Skin:  Intact without significant lesions or rashes. Cervical Nodes:  No  significant cervical adenopathy. Psych:  Alert and cooperative. Normal mood and affect.  Intake/Output from previous day: 04/29 0701 - 04/30 0700 In: 1860 [P.O.:360; I.V.:1500] Out: 1900 [Urine:1900] Intake/Output this shift:    Lab Results:  Recent Labs  06/20/14 0438 06/21/14 0415 06/22/14 0428  WBC 9.1 5.3 4.5  HGB 11.7* 11.7* 10.7*  HCT 34.9* 34.0* 32.3*  PLT 258 232 221   BMET  Recent Labs  06/20/14 0438 06/21/14 0415 06/22/14 0428  NA 130* 136 139  K 3.9 4.1 3.9  CL 105 105 104  CO2 18* 24 27  GLUCOSE 251* 326* 196*  BUN 11 6 5*  CREATININE 0.54 0.51 0.48*  CALCIUM 8.2* 8.5 8.6   LFT  Recent Labs  06/22/14 0428  PROT 5.5*  ALBUMIN 3.1*  AST 27  ALT 31  ALKPHOS 49  BILITOT 0.3   PT/INR No results for input(s): LABPROT, INR in the last 72 hours. Hepatitis Panel No results for input(s): HEPBSAG, HCVAB, HEPAIGM, HEPBIGM in the last 72 hours.   Studies/Results: Ct Abdomen Pelvis W Contrast  06/21/2014   CLINICAL DATA:  34 year old female with diarrhea, weight loss and gastric paresis. History of C difficile colitis.  EXAM: CT ABDOMEN AND PELVIS WITH CONTRAST  TECHNIQUE: Multidetector CT imaging of the abdomen and pelvis was performed using the standard protocol following bolus administration of intravenous contrast.  CONTRAST:  1mL OMNIPAQUE IOHEXOL 300 MG/ML  SOLN  COMPARISON:  CT of the abdomen and pelvis 05/30/2014.  FINDINGS: Lower chest:  Unremarkable.  Hepatobiliary: No cystic or solid hepatic lesions. No intra or extrahepatic biliary ductal dilatation. Gallbladder is normal in appearance.  Pancreas: Unremarkable.  Spleen: Several small splenic granulomas are noted.  Adrenals/Urinary Tract: Bilateral adrenal glands and bilateral kidneys are normal in appearance. No hydroureteronephrosis. Urinary bladder is normal in appearance.  Stomach/Bowel: Normal appearance of the stomach. No pathologic dilatation of small bowel or colon. There continues to be  some mural thickening in the sigmoid colon and rectum, however, the surrounding haziness in the adjacent mesorectal and mesocolic fat noted on the prior study has largely resolved. The more proximal aspects of the colon are otherwise normal in appearance. Normal appendix.  Vascular/Lymphatic: No atherosclerotic disease or aneurysm identified in the abdominal or pelvic vasculature. No lymphadenopathy noted in the abdomen or pelvis.  Reproductive: Uterus and ovaries are unremarkable in appearance.  Other: No significant volume of ascites.  No pneumoperitoneum.  Musculoskeletal: There are no aggressive appearing lytic or blastic lesions noted in the visualized portions of the skeleton.  IMPRESSION: 1. Although the inflammatory changes around the sigmoid colon and rectum have largely resolved compared to the prior study, there appears to be some residual mural thickening throughout the distal sigmoid colon and rectum, which may indicate some residual colitis. This appears improved. The remainder the colon is otherwise normal in appearance.   Electronically Signed   By: Vinnie Langton M.D.   On: 06/21/2014 14:37   US Abdomen Limited Ruq  06/20/2014   CLINICAL DATA:  Abdominal pain, right upper quadrant with nausea and vomiting and transaminitis.  EXAM: US ABDOMEN LIMITED - RIGHT UPPER QUADRANT  COMPARISON:  05/30/2014 abdominal CT  FINDINGS: Gallbladder:  No gallstones or wall thickening visualized. No sonographic Murphy sign noted.  Common bile duct:  Diameter: 3 mm  Liver:  No focal lesion identified. Within normal  limits in parenchymal echogenicity. Antegrade flow in the imaged portal venous system.  IMPRESSION: Normal right upper quadrant ultrasound.   Electronically Signed   By: Monte Fantasia M.D.   On: 06/20/2014 19:50    Active Problems:   DM (diabetes mellitus), type 1, uncontrolled   Diarrhea   Nausea vomiting and diarrhea   Metabolic acidosis   Generalized abdominal pain     IMPRESSION:    *Diarrhea, C. difficile toxin negative and lactoferrin negative.  Patient may still have pseudomembranous colitis.  Post infectious colitis, postinfectious IBS are also possibilities.  Underlying chronic diarrhea prior to recent infection could be due to bacterial overgrowth.  Repeat CT scan shows resolving inflammatory changes.**    PLAN:     *Continue Flagyl.  If not improved over the next 48 hours will proceed with flexible sigmoidoscopy Begin Flora Q1 daily Avoid other antibiotics**          Cherene Dobbins D. Deatra Ina, MD, [facility] [phone]    06/22/2014, 9:27 AM

## 2014-06-22 NOTE — Progress Notes (Signed)
PROGRESS NOTE  [name] DJM:[id number] DOB: 02-Jul-1980 DOA: 06/19/2014 PCP: [name], MD  Brief history 34 year old female with a history of diabetes mellitus type 1 who was recently discharged from the hospital on 05/31/2014 after an episode of DKA and Clostridium difficile colitis presents with 24 hours of nausea, vomiting, abdominal pain and diarrhea. Patient denied any hematochezia or melena. She denies any fevers or chills. She stated that she did not take Lantus for 24 hours prior to admission because of her nausea and vomiting and inability to eat or drink. Acute abdominal series in the emergency department was unremarkable. The patient has CT of the abdomen and pelvis on 05/30/2014 with inflammatory stranding surrounding the rectum and distal sigmoid. Another C. difficile PCR was sent at the time of this admission which was negative. Stool lactoferrin was also negative. Assessment/Plan: Diarrhea -Patient claims to have had 10-20 bowel movements daily since arrival to the medical floor--this has never been corroborated with patient's nurse or nurse tech -I have kindly asked the patient to notify any staff with each and every bowel movement daily -RN and Nurse techs continue to state that pt does not contact them -C diff PCR--neg -stool lactoferrin--negative -stool pathogen panel--pending -suspect a degree of irritable bowel as pt is on Bentyl at home -06/21/2014 CT abdomen and pelvis--improving inflammatory changes, but still with mural thickening of the distal sigmoid and rectal areas--> question patient's compliance at home with Flagyl -Consult GI-->monitor another 48 hours on Flagyl -given the patient's behavior during the hospitalization and continuing to refuse insulin as she chooses, doubt she was compliant with abx as outpt N/V and abdominal pain -tolerating full liquids-->advance to soft diet-->tolerating -lipase14 -repeat CT abdomen if no improvement--as  above -d/c IV opioids--will not restart unless there is objective evidence for worsening pain Diabetes mellitus type 1, poorly controlled -Patient's compliance is suspect -05/09/2014 hemoglobin A1c 11.8 -Continue NovoLog sliding scale, change to moderate scale-->pt refusing insulin during hospitalization -4/28, 4/29, 4/30--pt continues to refuse insulin  Nongapped metabolic acidosis -likely due to diarrhea-->resolved Transaminasemia -resolved   Family Communication: No family at beside Disposition Plan: Home when medically stable    Procedures/Studies: Ct Abdomen Pelvis W Contrast  06/21/2014   CLINICAL DATA:  34 year old female with diarrhea, weight loss and gastric paresis. History of C difficile colitis.  EXAM: CT ABDOMEN AND PELVIS WITH CONTRAST  TECHNIQUE: Multidetector CT imaging of the abdomen and pelvis was performed using the standard protocol following bolus administration of intravenous contrast.  CONTRAST:  35mL OMNIPAQUE IOHEXOL 300 MG/ML  SOLN  COMPARISON:  CT of the abdomen and pelvis 05/30/2014.  FINDINGS: Lower chest:  Unremarkable.  Hepatobiliary: No cystic or solid hepatic lesions. No intra or extrahepatic biliary ductal dilatation. Gallbladder is normal in appearance.  Pancreas: Unremarkable.  Spleen: Several small splenic granulomas are noted.  Adrenals/Urinary Tract: Bilateral adrenal glands and bilateral kidneys are normal in appearance. No hydroureteronephrosis. Urinary bladder is normal in appearance.  Stomach/Bowel: Normal appearance of the stomach. No pathologic dilatation of small bowel or colon. There continues to be some mural thickening in the sigmoid colon and rectum, however, the surrounding haziness in the adjacent mesorectal and mesocolic fat noted on the prior study has largely resolved. The more proximal aspects of the colon are otherwise normal in appearance. Normal appendix.  Vascular/Lymphatic: No atherosclerotic disease or aneurysm identified in the  abdominal or pelvic vasculature. No lymphadenopathy noted in the abdomen or pelvis.  Reproductive: Uterus  and ovaries are unremarkable in appearance.  Other: No significant volume of ascites.  No pneumoperitoneum.  Musculoskeletal: There are no aggressive appearing lytic or blastic lesions noted in the visualized portions of the skeleton.  IMPRESSION: 1. Although the inflammatory changes around the sigmoid colon and rectum have largely resolved compared to the prior study, there appears to be some residual mural thickening throughout the distal sigmoid colon and rectum, which may indicate some residual colitis. This appears improved. The remainder the colon is otherwise normal in appearance.   Electronically Signed   By: Vinnie Langton M.D.   On: 06/21/2014 14:37   Ct Abdomen Pelvis W Contrast  05/30/2014   CLINICAL DATA:  Abdomen pain, gastro paresis, weight loss and C diff infection.  EXAM: CT ABDOMEN AND PELVIS WITH CONTRAST  TECHNIQUE: Multidetector CT imaging of the abdomen and pelvis was performed using the standard protocol following bolus administration of intravenous contrast.  CONTRAST:  68mL OMNIPAQUE IOHEXOL 300 MG/ML  SOLN  COMPARISON:  February 15, 2014  FINDINGS: The liver, pancreas, adrenal glands and kidneys are normal. There are calcified granulomas within the spleen. The gallbladder is decompressed limiting evaluation. Minimal ascites is noted surrounding the liver. The aorta is normal in caliber. Minimal atherosclerosis of the aorta is noted. There is no abdominal lymphadenopathy.  The stomach is distended consistent with known gastroparesis. The small bowel is normal. Moderate bowel content is identified throughout colon. The appendix is normal. There is stranding and inflammation surrounding the rectum and distal portion of the sigmoid colon with small amount of fluid in the posterior pelvis and stranding in the presacral space.  There is mild diffuse bladder wall thickening with  surrounding stranding. The uterus is normal. Small amount of free fluid is identified within the pelvis. The visualized lung bases are clear. No acute abnormality is identified within the visualized bones.  IMPRESSION: Stranding and inflammation surrounding the rectum and distal portion of sigmoid colon consistent with colitis.  Distended stomach consistent with known gastroparesis.  Minimal ascites in the abdomen.   Electronically Signed   By: Abelardo Diesel M.D.   On: 05/30/2014 15:10   Dg Abd Acute W/chest  06/20/2014   CLINICAL DATA:  Acute onset of nausea, vomiting and diarrhea. Generalized abdominal pain and weakness. Initial encounter.  EXAM: DG ABDOMEN ACUTE W/ 1V CHEST  COMPARISON:  Chest radiograph performed 03/03/2014, and CT of the abdomen and pelvis from 05/30/2014  FINDINGS: The lungs are well-aerated and clear. There is no evidence of focal opacification, pleural effusion or pneumothorax. The cardiomediastinal silhouette is within normal limits. Bilateral metallic nipple piercings are noted. Clips are seen overlying the lung apices bilaterally.  The visualized bowel gas pattern is unremarkable. Scattered stool and air are seen within the colon; there is no evidence of small bowel dilatation to suggest obstruction. No free intra-abdominal air is identified on the provided upright view.  No acute osseous abnormalities are seen; the sacroiliac joints are unremarkable in appearance.  IMPRESSION: 1. Unremarkable bowel gas pattern; no free intra-abdominal air seen. Small amount of stool noted in the colon. 2. No acute cardiopulmonary process seen.   Electronically Signed   By: Garald Balding M.D.   On: 06/20/2014 00:00   US Abdomen Limited Ruq  06/20/2014   CLINICAL DATA:  Abdominal pain, right upper quadrant with nausea and vomiting and transaminitis.  EXAM: US ABDOMEN LIMITED - RIGHT UPPER QUADRANT  COMPARISON:  05/30/2014 abdominal CT  FINDINGS: Gallbladder:  No gallstones or wall thickening  visualized. No sonographic Murphy sign noted.  Common bile duct:  Diameter: 3 mm  Liver:  No focal lesion identified. Within normal limits in parenchymal echogenicity. Antegrade flow in the imaged portal venous system.  IMPRESSION: Normal right upper quadrant ultrasound.   Electronically Signed   By: Monte Fantasia M.D.   On: 06/20/2014 19:50         Subjective: Patient continues to claim that she was having 10 bowel movements today. She continues to have abdominal pain. She denies any fevers, chills, chest pain, shortness breath, dysuria, hematuria.  Objective: Filed Vitals:   06/21/14 0500 06/21/14 1640 06/21/14 2122 06/22/14 0617  BP: 117/80 106/70 102/79 109/75  Pulse: 105 83 90 87  Temp: 98.3 F (36.8 C) 97.9 F (36.6 C) 98.6 F (37 C) 98.3 F (36.8 C)  TempSrc: Oral Oral Oral Oral  Resp: 16 16 16 16   Height:      Weight:      SpO2: 100% 100% 100% 100%    Intake/Output Summary (Last 24 hours) at 06/22/14 1438 Last data filed at 06/22/14 0630  Gross per 24 hour  Intake   1860 ml  Output   1300 ml  Net    560 ml   Weight change:  Exam:   General:  Pt is alert, follows commands appropriately, not in acute distress  HEENT: No icterus, No thrush,  [place]/AT  Cardiovascular: RRR, S1/S2, no rubs, no gallops  Respiratory: CTA bilaterally, no wheezing, no crackles, no rhonchi  Abdomen: Soft/+BS, non tender when distracted, non distended, no guarding  Extremities: No edema, No lymphangitis, No petechiae, No rashes, no synovitis  Data Reviewed: Basic Metabolic Panel:  Recent Labs Lab 06/15/14 2109 06/19/14 1955 06/19/14 2106 06/20/14 0438 06/21/14 0415 06/22/14 0428  NA 137 136 131* 130* 136 139  K 3.8 4.1 4.1 3.9 4.1 3.9  CL 105 102 106 105 105 104  CO2 23  --  19 18* 24 27  GLUCOSE 245* 229* 240* 251* 326* 196*  BUN 13 11 12 11 6  5*  CREATININE 0.86 0.60 0.55 0.54 0.51 0.48*  CALCIUM 9.0  --  8.9 8.2* 8.5 8.6   Liver Function Tests:  Recent Labs Lab  06/21/14 0415 06/22/14 0428  AST 26 27  ALT 30 31  ALKPHOS 55 49  BILITOT 0.4 0.3  PROT 5.8* 5.5*  ALBUMIN 3.4* 3.1*   No results for input(s): LIPASE, AMYLASE in the last 168 hours. No results for input(s): AMMONIA in the last 168 hours. CBC:  Recent Labs Lab 06/15/14 2109 06/19/14 1955 06/19/14 2106 06/20/14 0438 06/21/14 0415 06/22/14 0428  WBC 5.6  --  8.3 9.1 5.3 4.5  NEUTROABS 2.8  --  4.8 5.1  --   --   HGB 12.2 15.6* 14.6 11.7* 11.7* 10.7*  HCT 35.1* 46.0 42.8 34.9* 34.0* 32.3*  MCV 89.3  --  88.4 89.7 90.2 90.7  PLT 272  --  330 258 232 221   Cardiac Enzymes: No results for input(s): CKTOTAL, CKMB, CKMBINDEX, TROPONINI in the last 168 hours. BNP: Invalid input(s): POCBNP CBG:  Recent Labs Lab 06/21/14 1121 06/21/14 1644 06/21/14 2121 06/22/14 0802 06/22/14 1236  GLUCAP 271* 316* 155* 154* 121*    Recent Results (from the past 240 hour(s))  Clostridium Difficile by PCR     Status: None   Collection Time: 06/20/14  3:40 AM  Result Value Ref Range Status   C difficile by pcr NEGATIVE NEGATIVE Final  Stool culture  Status: None (Preliminary result)   Collection Time: 06/20/14  3:40 AM  Result Value Ref Range Status   Specimen Description STOOL  Final   Special Requests NONE  Final   Culture   Final    Culture reincubated for better growth Performed at [facility]    Report Status PENDING  Incomplete     Scheduled Meds: . acidophilus  1 capsule Oral Daily  . amitriptyline  100 mg Oral QHS  . cyanocobalamin  500 mcg Oral BID  . dicyclomine  10 mg Oral TID AC & HS  . enoxaparin (LOVENOX) injection  40 mg Subcutaneous Q24H  . feeding supplement (GLUCERNA SHAKE)  237 mL Oral TID BM  . gabapentin  300 mg Oral QHS  . insulin aspart  0-15 Units Subcutaneous TID WC  . insulin aspart  4 Units Subcutaneous TID WC  . insulin glargine  15 Units Subcutaneous q morning - 10a  . ketorolac  1 drop Left Eye QID  . metroNIDAZOLE  500 mg Oral 3  times per day  . multivitamin with minerals  1 tablet Oral q morning - 10a  . ofloxacin  1 drop Left Eye QID  . prednisoLONE acetate  1 drop Left Eye QID   Continuous Infusions: . sodium chloride 125 mL/hr (06/21/14 1428)     Deyana Wnuk, DO  [facility] Pager [phone]  If 7PM-7AM, please contact night-coverage [email or web address] Password [email or web address] 06/22/2014, 2:38 PM   LOS: 3 days

## 2014-06-22 NOTE — Progress Notes (Signed)
Patient reports a total of 6 bowel movements today 7am-7pm.  A previous discussion was had with patient of the need for staff to observe all bowel movements in order to accurately inform MD of the number and consistency.  The last 3 bowel movements were observed by either NT or RN and consisted of  large amounts of watery light green liquid with a small to moderate amount of green/tan mucous.  Last 3 bm volumes were 800 cc's, 600 cc's and 750 cc's.  MD Tat notified of above.  Also notified of patient/ family concerns regarding MD and patient's care. Reported to oncoming night shift RN/NT and patient the importance of seeing and measuring all bowel movements overnight.  Additional toilet hats were placed in patients bathroom to assure observation and accurate measurement of all patient bowel movements. Patient has been cooperative with requests to see all bowel movements since discussion.   Christen Bame RN

## 2014-06-23 DIAGNOSIS — D638 Anemia in other chronic diseases classified elsewhere: Secondary | ICD-10-CM

## 2014-06-23 LAB — GLUCOSE, CAPILLARY
GLUCOSE-CAPILLARY: 246 mg/dL — AB (ref 70–99)
GLUCOSE-CAPILLARY: 258 mg/dL — AB (ref 70–99)
Glucose-Capillary: 350 mg/dL — ABNORMAL HIGH (ref 70–99)
Glucose-Capillary: 223 mg/dL — ABNORMAL HIGH (ref 70–99)

## 2014-06-23 MED ORDER — PROMETHAZINE HCL 25 MG/ML IJ SOLN
25.0000 mg | Freq: Four times a day (QID) | INTRAMUSCULAR | Status: DC | PRN
  Administered 2014-06-23 – 2014-07-06 (×46): 25 mg via INTRAVENOUS
  Filled 2014-06-23 (×47): qty 1

## 2014-06-23 NOTE — Progress Notes (Signed)
Patient ID: [name], female   DOB: [birth year], 34 y.o.   MRN: [id number] [facility] PROGRESS NOTE  [name] NTI:[id number] DOB: Feb 24, 1980 DOA: 06/19/2014 PCP: Mandy Jewel, MD  Brief narrative:    34 year old female with a history of uncontrolled diabetes mellitus type 1, recent hospitalization for DKA thought to due to acute infection from C.diff colitis. Pt presented to [facility] with  24 hours of nausea, vomiting, abdominal pain and diarrhea. Acute abdominal series in the emergency department were unremarkable. CT of the abdomen and pelvis on this admission showed largely resolved inflammatory changes around the sigmoid colon and rectum but there is still some residual mural thickening throughout the distal sigmoid colon and rectum indicating residual colitis. GI has seen the pt in consultaion.  Barrier to discharge: pt continues to have high volume stools and GI requested quantitation of the stool volume over next 24 hours and if no improvement they will likely do sigmoidoscopy.   Assessment/Plan:    Principal Problem: Diarrhea - Uncleawr etiology as to why pt still has high volume stools. Her C.diff 4/28 is negative. She may have residual colitis but not sure if this would explain frequent stools. - GI requested quantitation stools over 24 hours and if no improvement they plan to do sigmoidoscopy.  - Stool lactoferrin is negative. GI pathogen panel is pending.  - CT abdomen and pelvis on 06/21/14 showed improving inflammatory changes but still mural thickening of the distal sigmoid and rectal areas. - Appreciate GI following - Continue Flagyl, bentyl and acidophilus   Active Problems: N/V and abdominal pain / Gastroparesis  - Possible gastroparesis from uncontrolled diabetes  - Now on liquid diet; advance as tolerated - Pt requested more pain meds but per TRH yesterday pt upset that pain meds not titrated up. I think that we can leave current pain regimen and i have  communicated with RN not to further increase the dose or frequency since no objective evidence of pain.  Diabetes mellitus type 1, uncontrolled with gastroparesis as complication  / poor compliance with insulin  - A1c on 05/09/2014 11.8 indicating poor glycemic control - Current insulin regimen: Lantus 15 units am and novolog 4 units TID along with SSI - Pt has refused insulin on several occasions - CBG's in past 24 hours: 246, 350, 258 likely uncontrolled due to poor compliance.   Non anion gap metabolic acidosis - Secondary to diarrhea - Now resolved, CO2 27   DVT Prophylaxis  - Lovenox subQ ordered while pt is in hospital    Code Status: Full.  Family Communication:  plan of care discussed with the patient Disposition Plan: not yet stable for discharge. Possible sigmoidoscopy in am.  IV access:  Peripheral IV  Procedures and diagnostic studies:    Ct Abdomen Pelvis W Contrast 06/21/2014  1. Although the inflammatory changes around the sigmoid colon and rectum have largely resolved compared to the prior study, there appears to be some residual mural thickening throughout the distal sigmoid colon and rectum, which may indicate some residual colitis. This appears improved. The remainder the colon is otherwise normal in appearance.     Dg Abd Acute W/chest 06/20/2014 1. Unremarkable bowel gas pattern; no free intra-abdominal air seen. Small amount of stool noted in the colon. 2. No acute cardiopulmonary process seen.     US Abdomen Limited Ruq 06/20/2014  Normal right upper quadrant ultrasound.     Medical Consultants:  Gastroenterology, [name]  Other Consultants:  None  IAnti-Infectives:   Flagyl    Mandy Lenz, MD  [facility] Pager [phone]  Time spent in minutes: 25 minutes  If 7PM-7AM, please contact night-coverage [email or web address] Password [email or web address] 06/23/2014, 8:07 PM   LOS: 4 days    HPI/Subjective: No acute overnight events. Patient reports  ongoing abdominal pain, 6/10 in intensity.   Objective: Filed Vitals:   06/22/14 0617 06/22/14 2030 06/23/14 0425 06/23/14 1418  BP: 109/75 116/86 109/71 119/77  Pulse: 87 86 83 93  Temp: 98.3 F (36.8 C) 98.5 F (36.9 C) 98.5 F (36.9 C) 98.8 F (37.1 C)  TempSrc: Oral Oral Oral Oral  Resp: 16 16 12 14   Height:      Weight:      SpO2: 100% 100% 100% 100%    Intake/Output Summary (Last 24 hours) at 06/23/14 2007 Last data filed at 06/23/14 1921  Gross per 24 hour  Intake 5117.08 ml  Output    800 ml  Net 4317.08 ml    Exam:   General:  Pt is alert, follows commands appropriately, not in acute distress  Cardiovascular: Regular rate and rhythm, S1/S2, no murmurs  Respiratory: Clear to auscultation bilaterally, no wheezing, no crackles, no rhonchi  Abdomen: tender in lower and mid abdomen to palpation, no distention, (+) BS  Extremities: No edema, pulses DP and PT palpable bilaterally  Neuro: Grossly nonfocal  Data Reviewed: Basic Metabolic Panel:  Recent Labs Lab 06/19/14 1955 06/19/14 2106 06/20/14 0438 06/21/14 0415 06/22/14 0428  NA 136 131* 130* 136 139  K 4.1 4.1 3.9 4.1 3.9  CL 102 106 105 105 104  CO2  --  19 18* 24 27  GLUCOSE 229* 240* 251* 326* 196*  BUN 11 12 11 6  5*  CREATININE 0.60 0.55 0.54 0.51 0.48*  CALCIUM  --  8.9 8.2* 8.5 8.6   Liver Function Tests:  Recent Labs Lab 06/21/14 0415 06/22/14 0428  AST 26 27  ALT 30 31  ALKPHOS 55 49  BILITOT 0.4 0.3  PROT 5.8* 5.5*  ALBUMIN 3.4* 3.1*   No results for input(s): LIPASE, AMYLASE in the last 168 hours. No results for input(s): AMMONIA in the last 168 hours. CBC:  Recent Labs Lab 06/19/14 1955 06/19/14 2106 06/20/14 0438 06/21/14 0415 06/22/14 0428  WBC  --  8.3 9.1 5.3 4.5  NEUTROABS  --  4.8 5.1  --   --   HGB 15.6* 14.6 11.7* 11.7* 10.7*  HCT 46.0 42.8 34.9* 34.0* 32.3*  MCV  --  88.4 89.7 90.2 90.7  PLT  --  330 258 232 221   Cardiac Enzymes: No results for  input(s): CKTOTAL, CKMB, CKMBINDEX, TROPONINI in the last 168 hours. BNP: Invalid input(s): POCBNP CBG:  Recent Labs Lab 06/22/14 1752 06/22/14 2123 06/23/14 0659 06/23/14 1149 06/23/14 1609  GLUCAP 378* 293* 246* 350* 258*    Recent Results (from the past 240 hour(s))  Clostridium Difficile by PCR     Status: None   Collection Time: 06/20/14  3:40 AM  Result Value Ref Range Status   C difficile by pcr NEGATIVE NEGATIVE Final  Stool culture     Status: None (Preliminary result)   Collection Time: 06/20/14  3:40 AM  Result Value Ref Range Status   Specimen Description STOOL  Final   Special Requests NONE  Final   Culture   Final    NO SUSPICIOUS COLONIES, CONTINUING TO HOLD Performed at [facility]    Report Status PENDING  Incomplete  Scheduled Meds: . acidophilus  1 capsule Oral Daily  . amitriptyline  100 mg Oral QHS  . cyanocobalamin  500 mcg Oral BID  . dicyclomine  10 mg Oral TID AC & HS  . enoxaparin (LOVENOX)  40 mg Subcutaneous Q24H  . feeding supplement   237 mL Oral TID BM  . gabapentin  300 mg Oral QHS  . insulin aspart  0-15 Units Subcutaneous TID WC  . insulin aspart  4 Units Subcutaneous TID WC  . insulin glargine  15 Units Subcutaneous q morning - 10a  . metroNIDAZOLE  500 mg Oral 3 times per day  . multivitamin with mine  1 tablet Oral q morning - 10a

## 2014-06-23 NOTE — Progress Notes (Addendum)
Paged Dr. Charlies Silvers via Shea Evans to notify of patients continuing liquid bms, pain, nausea and vomiting all witnessed by this RN and NT Karleen Dolphin.  Received call from Dr. Charlies Silvers in which she advises no changes to be made with pain meds and phenergan iv 25mg  q6h prn nausea and vomiting can be ordered.  Will continue to monitor patient closely.  Christen Bame RN

## 2014-06-23 NOTE — Progress Notes (Signed)
Progress Note   Subjective  *Diarrhea is perhaps slightly decreased in frequency.  Still complains of abdominal discomfort.**   Objective  Vital signs in last 24 hours: Temp:  [98.5 F (36.9 C)] 98.5 F (36.9 C) (05/01 0425) Pulse Rate:  [83-86] 83 (05/01 0425) Resp:  [12-16] 12 (05/01 0425) BP: (109-116)/(71-86) 109/71 mmHg (05/01 0425) SpO2:  [100 %] 100 % (05/01 0425) Last BM Date: 06/22/14  General: Alert, well-developed,  in NAD Heart:  Regular rate and rhythm; no murmurs Chest: Clear to ascultation bilaterally Abdomen:  Soft, mildly diffusely tender Extremities:  Without edema. Neurologic:  Alert and  oriented x4; grossly normal neurologically. Psych:  Alert and cooperative. Normal mood and affect.  Intake/Output from previous day: 04/30 0701 - 05/01 0700 In: 4582.5 [P.O.:2520; I.V.:2062.5] Out: 800 [Urine:400; Stool:400] Intake/Output this shift: Total I/O In: 360 [P.O.:360] Out: -   Lab Results:  Recent Labs  06/21/14 0415 06/22/14 0428  WBC 5.3 4.5  HGB 11.7* 10.7*  HCT 34.0* 32.3*  PLT 232 221   BMET  Recent Labs  06/21/14 0415 06/22/14 0428  NA 136 139  K 4.1 3.9  CL 105 104  CO2 24 27  GLUCOSE 326* 196*  BUN 6 5*  CREATININE 0.51 0.48*  CALCIUM 8.5 8.6   LFT  Recent Labs  06/22/14 0428  PROT 5.5*  ALBUMIN 3.1*  AST 27  ALT 31  ALKPHOS 49  BILITOT 0.3   PT/INR No results for input(s): LABPROT, INR in the last 72 hours. Hepatitis Panel No results for input(s): HEPBSAG, HCVAB, HEPAIGM, HEPBIGM in the last 72 hours.  Studies/Results: Ct Abdomen Pelvis W Contrast  06/21/2014   CLINICAL DATA:  34 year old female with diarrhea, weight loss and gastric paresis. History of C difficile colitis.  EXAM: CT ABDOMEN AND PELVIS WITH CONTRAST  TECHNIQUE: Multidetector CT imaging of the abdomen and pelvis was performed using the standard protocol following bolus administration of intravenous contrast.  CONTRAST:  71mL OMNIPAQUE IOHEXOL  300 MG/ML  SOLN  COMPARISON:  CT of the abdomen and pelvis 05/30/2014.  FINDINGS: Lower chest:  Unremarkable.  Hepatobiliary: No cystic or solid hepatic lesions. No intra or extrahepatic biliary ductal dilatation. Gallbladder is normal in appearance.  Pancreas: Unremarkable.  Spleen: Several small splenic granulomas are noted.  Adrenals/Urinary Tract: Bilateral adrenal glands and bilateral kidneys are normal in appearance. No hydroureteronephrosis. Urinary bladder is normal in appearance.  Stomach/Bowel: Normal appearance of the stomach. No pathologic dilatation of small bowel or colon. There continues to be some mural thickening in the sigmoid colon and rectum, however, the surrounding haziness in the adjacent mesorectal and mesocolic fat noted on the prior study has largely resolved. The more proximal aspects of the colon are otherwise normal in appearance. Normal appendix.  Vascular/Lymphatic: No atherosclerotic disease or aneurysm identified in the abdominal or pelvic vasculature. No lymphadenopathy noted in the abdomen or pelvis.  Reproductive: Uterus and ovaries are unremarkable in appearance.  Other: No significant volume of ascites.  No pneumoperitoneum.  Musculoskeletal: There are no aggressive appearing lytic or blastic lesions noted in the visualized portions of the skeleton.  IMPRESSION: 1. Although the inflammatory changes around the sigmoid colon and rectum have largely resolved compared to the prior study, there appears to be some residual mural thickening throughout the distal sigmoid colon and rectum, which may indicate some residual colitis. This appears improved. The remainder the colon is otherwise normal in appearance.   Electronically Signed   By: Quillian Quince  Entrikin M.D.   On: 06/21/2014 14:37      Assessment & Plan  *Diarrhea - there is no evidence for pseudomembranous colitis as determined by*stool studies but she continues to have requested high-volume stools.  This may represent a post  infectious colitis or IBS.  Inflammatory changes on CT have regressed.  Plan to quantitate stool output.  If not improved over next 24 hours would consider sigmoidoscopy * Active Problems:   DM (diabetes mellitus), type 1, uncontrolled   Diarrhea   Nausea vomiting and diarrhea   Metabolic acidosis   Generalized abdominal pain   Personal history of noncompliance with medical treatment     LOS: 4 days   Erskine Emery  06/23/2014, 9:21 AM

## 2014-06-23 NOTE — Progress Notes (Signed)
  Pt has had 4 loose green watery bm's approx 400 cc each time with some tan colored particles.Cont to ask for [place] ice and orange sherbert even when c/o nausea.States emesis x 2 small amt bile colored but not witnessed.Zofran 4 mg iv given 0420. Aggie Moats D

## 2014-06-24 ENCOUNTER — Encounter (HOSPITAL_COMMUNITY): Payer: Self-pay | Admitting: Gastroenterology

## 2014-06-24 ENCOUNTER — Encounter (HOSPITAL_COMMUNITY)
Admission: EM | Disposition: A | Discharge status: Home / Self Care | Payer: Self-pay | Source: Home / Self Care | Attending: Internal Medicine

## 2014-06-24 HISTORY — PX: FLEXIBLE SIGMOIDOSCOPY: SHX5431

## 2014-06-24 LAB — GLUCOSE, CAPILLARY
Glucose-Capillary: 294 mg/dL — ABNORMAL HIGH (ref 70–99)
Glucose-Capillary: 309 mg/dL — ABNORMAL HIGH (ref 70–99)
Glucose-Capillary: 346 mg/dL — ABNORMAL HIGH (ref 70–99)
Glucose-Capillary: 365 mg/dL — ABNORMAL HIGH (ref 70–99)
Glucose-Capillary: 456 mg/dL — ABNORMAL HIGH (ref 70–99)

## 2014-06-24 LAB — STOOL CULTURE

## 2014-06-24 SURGERY — SIGMOIDOSCOPY, FLEXIBLE
Anesthesia: Moderate Sedation

## 2014-06-24 MED ORDER — FENTANYL CITRATE (PF) 100 MCG/2ML IJ SOLN
INTRAMUSCULAR | Status: DC | PRN
  Administered 2014-06-24 (×4): 25 ug via INTRAVENOUS

## 2014-06-24 MED ORDER — MIDAZOLAM HCL 10 MG/2ML IJ SOLN
INTRAMUSCULAR | Status: AC
  Filled 2014-06-24: qty 2

## 2014-06-24 MED ORDER — HYDROMORPHONE HCL 1 MG/ML IJ SOLN
INTRAMUSCULAR | Status: AC
  Filled 2014-06-24: qty 1

## 2014-06-24 MED ORDER — FENTANYL CITRATE (PF) 100 MCG/2ML IJ SOLN
INTRAMUSCULAR | Status: AC
  Filled 2014-06-24: qty 2

## 2014-06-24 MED ORDER — INSULIN ASPART 100 UNIT/ML ~~LOC~~ SOLN
7.0000 [IU] | Freq: Three times a day (TID) | SUBCUTANEOUS | Status: DC
  Administered 2014-06-25: 7 [IU] via SUBCUTANEOUS

## 2014-06-24 MED ORDER — DIPHENHYDRAMINE HCL 50 MG/ML IJ SOLN
INTRAMUSCULAR | Status: AC
  Filled 2014-06-24: qty 1

## 2014-06-24 MED ORDER — ENOXAPARIN SODIUM 40 MG/0.4ML ~~LOC~~ SOLN
40.0000 mg | SUBCUTANEOUS | Status: DC
  Administered 2014-06-25: 40 mg via SUBCUTANEOUS
  Filled 2014-06-24 (×2): qty 0.4

## 2014-06-24 MED ORDER — SODIUM CHLORIDE 0.9 % IV SOLN
INTRAVENOUS | Status: DC
  Administered 2014-06-24: 500 mL via INTRAVENOUS

## 2014-06-24 MED ORDER — INSULIN GLARGINE 100 UNIT/ML ~~LOC~~ SOLN
20.0000 [IU] | Freq: Every morning | SUBCUTANEOUS | Status: DC
  Filled 2014-06-24 (×2): qty 0.2

## 2014-06-24 MED ORDER — HYDROMORPHONE HCL 1 MG/ML IJ SOLN
1.0000 mg | Freq: Three times a day (TID) | INTRAMUSCULAR | Status: DC | PRN
  Administered 2014-06-25 (×2): 1 mg via INTRAVENOUS
  Filled 2014-06-24 (×3): qty 1

## 2014-06-24 MED ORDER — HYDROMORPHONE HCL 1 MG/ML IJ SOLN
1.0000 mg | Freq: Once | INTRAMUSCULAR | Status: AC
  Administered 2014-06-24: 1 mg via INTRAVENOUS
  Filled 2014-06-24: qty 1

## 2014-06-24 MED ORDER — LOPERAMIDE HCL 2 MG PO CAPS
4.0000 mg | ORAL_CAPSULE | Freq: Two times a day (BID) | ORAL | Status: DC
  Administered 2014-06-24 – 2014-06-27 (×8): 4 mg via ORAL
  Filled 2014-06-24 (×16): qty 2

## 2014-06-24 MED ORDER — HYDROMORPHONE HCL 1 MG/ML IJ SOLN
1.0000 mg | Freq: Once | INTRAMUSCULAR | Status: AC
  Administered 2014-06-24: 1 mg via INTRAVENOUS

## 2014-06-24 MED ORDER — MIDAZOLAM HCL 10 MG/2ML IJ SOLN
INTRAMUSCULAR | Status: DC | PRN
  Administered 2014-06-24 (×4): 2 mg via INTRAVENOUS

## 2014-06-24 NOTE — Progress Notes (Addendum)
Pt has had 5 watery stools total amt 2100 cc's I personally visualized  these stools.Pherergan 25mg  IV given for nausea with relief 0200,Emesis x 1 non-observed.PO's consist of glucerna.jello,lactaid,pudding and liquids and soft food,1200 cc intake.Aggie Moats D

## 2014-06-24 NOTE — Progress Notes (Signed)
     [facility] Progress Note  Subjective:  Still with watery diarrhea- pt says she has seen little improvement. Still with abd pain--says dilaudid is the only thing that provides relief. MOther very concerned--voices concerns about daughter not being taken seriously.Nurse reports pt filled 2 hats with liquid stool last pm.   Objective:  Vital signs in last 24 hours: Temp:  [98.4 F (36.9 C)-98.8 F (37.1 C)] 98.4 F (36.9 C) (05/02 0418) Pulse Rate:  [87-93] 87 (05/02 0418) Resp:  [14-16] 16 (05/02 0418) BP: (108-124)/(71-96) 108/71 mmHg (05/02 0418) SpO2:  [100 %] 100 % (05/02 0418) Last BM Date: 06/23/14 General:   Alert,  Well-developed, in NAD Heart:  Regular rate and rhythm; no murmurs Pulm;lungs clear bilat Abdomen:  Soft, mild diffuse tenderness,  nondistended. Normal bowel sounds, without guarding, and without rebound.   Extremities:  Without edema. Neurologic: Alert and  oriented x4;  grossly normal neurologically. Psych: Alert and cooperative. Normal mood and affect.  Intake/Output from previous day: 05/01 0701 - 05/02 0700 In: 4937.1 [P.O.:3560; I.V.:1377.1] Out: -     Lab Results:  Recent Labs  06/22/14 0428  WBC 4.5  HGB 10.7*  HCT 32.3*  PLT 221   BMET  Recent Labs  06/22/14 0428  NA 139  K 3.9  CL 104  CO2 27  GLUCOSE 196*  BUN 5*  CREATININE 0.48*  CALCIUM 8.6   LFT  Recent Labs  06/22/14 0428  PROT 5.5*  ALBUMIN 3.1*  AST 27  ALT 31  ALKPHOS 49  BILITOT 0.3     ASSESSMENT/PLAN:   *Diarrhea - there is no evidence for pseudomembranous colitis as determined by*stool studies but she continues to have requested high-volume stools. This may represent a post infectious colitis or IBS. Inflammatory changes on CT have regressed. Patient scheduled for flex sig later this morning (eval for colitis/ microscopic colitis, etc). ( last lovenox given yesterday afternoon).     LOS: 5 days   Jahmire Ruffins, Vita Barley PA-C  06/24/2014, Pager [phone]

## 2014-06-24 NOTE — Progress Notes (Signed)
[facility] PROGRESS NOTE  KENYADA DOSCH KZS:[id number] DOB: 08/27/1980 DOA: 06/19/2014 PCP: [name], MD  Brief narrative 34 year old female with history of uncontrolled type 1 diabetes mellitus, recent hospitalization for DKA thought to be secondary to C. difficile returned to the ED with 24-hour history of nausea, vomiting, abdominal pain with diarrhea. CT of the abdomen and pelvis showed a resolved inflammatory changes around sigmoid colon and rectum with some residual thickening throughout distal sigmoid colon and rectum suggestive residual colitis. Patient seen by GI and underwent flexible sigmoidoscopy on 5/2 for persistent diarrhea which was negative for pseudomembranous colitis.   Assessment/Plan: Ongoing diarrhea Etiology unclear. 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 C. difficile negative. GI started patient on Imodium. Check serology for celiac sprue. Continue IV fluids. Flagyl discontinued. Continue dicyclomine and amitriptyline.   Uncontrolled type  1diabetes mellitus with gastroparesis Continue liquids and advance as tolerated. No further nausea and vomiting today. Continue current pain regimen. Added low dose when necessary Dilaudid for pain. Fingersticks elevated. Have increased Levemir 20 units daily and pre-meal aspart 7 units 3 times a day. Continue Neurontin for peripheral neuropathy. Continue Phenergan for nausea and vomiting. Last A1c of 11.8.  Protein calorie malnutrition Added supplement.  Diet: Diabetic  DVT prophylaxis: Subcutaneous Lovenox   Consults: [facility]  Procedure:  Flexible sigmoidoscopy on 5/2 CT abdomen and pelvis  Code Status: Full code Family Communication: Mother at bedside Disposition Plan: Home once symptoms improved      Antibiotics:  Flagyl  HPI/Subjective: Patient seen and examined after returning from sigmoidoscopy. Reports 4-5 episodes  of diarrhea today. Complains of abdominal pain off and on. Denies nausea or vomiting.  Objective: Filed Vitals:   06/24/14 1400  BP: 96/66  Pulse: 70  Temp: 98.3 F (36.8 C)  Resp: 18    Intake/Output Summary (Last 24 hours) at 06/24/14 1805 Last data filed at 06/24/14 1759  Gross per 24 hour  Intake   2240 ml  Output      0 ml  Net   2240 ml   Filed Weights   06/20/14 0015  Weight: 44 kg (97 lb)    Exam:   General:  Young thin built female in no acute distress  HEENT: No pallor, moist oral mucosa, supple neck  Chest: Clear to auscultation bilaterally, no added sounds  CVS: Normal S1 and S2, no murmurs or gallop  GI: Soft, nondistended, minimal diffuse tenderness, all sounds present  Musculoskeletal: Warm, no edema  CNS: Alert and oriented    Data Reviewed: Basic Metabolic Panel:  Recent Labs Lab 06/19/14 1955 06/19/14 2106 06/20/14 0438 06/21/14 0415 06/22/14 0428  NA 136 131* 130* 136 139  K 4.1 4.1 3.9 4.1 3.9  CL 102 106 105 105 104  CO2  --  19 18* 24 27  GLUCOSE 229* 240* 251* 326* 196*  BUN 11 12 11 6  5*  CREATININE 0.60 0.55 0.54 0.51 0.48*  CALCIUM  --  8.9 8.2* 8.5 8.6   Liver Function Tests:  Recent Labs Lab 06/21/14 0415 06/22/14 0428  AST 26 27  ALT 30 31  ALKPHOS 55 49  BILITOT 0.4 0.3  PROT 5.8* 5.5*  ALBUMIN 3.4* 3.1*   No results for input(s): LIPASE, AMYLASE in the last 168 hours. No results for input(s): AMMONIA in the last 168 hours. CBC:  Recent Labs Lab 06/19/14 1955 06/19/14 2106 06/20/14 0438 06/21/14 0415 06/22/14 0428  WBC  --  8.3 9.1 5.3  4.5  NEUTROABS  --  4.8 5.1  --   --   HGB 15.6* 14.6 11.7* 11.7* 10.7*  HCT 46.0 42.8 34.9* 34.0* 32.3*  MCV  --  88.4 89.7 90.2 90.7  PLT  --  330 258 232 221   Cardiac Enzymes: No results for input(s): CKTOTAL, CKMB, CKMBINDEX, TROPONINI in the last 168 hours. BNP (last 3 results) No results for input(s): BNP in the last 8760 hours.  ProBNP (last 3  results) No results for input(s): PROBNP in the last 8760 hours.  CBG:  Recent Labs Lab 06/23/14 1609 06/23/14 2206 06/24/14 0751 06/24/14 1111 06/24/14 1753  GLUCAP 258* 223* 309* 365* 346*    Recent Results (from the past 240 hour(s))  Clostridium Difficile by PCR     Status: None   Collection Time: 06/20/14  3:40 AM  Result Value Ref Range Status   C difficile by pcr NEGATIVE NEGATIVE Final  Stool culture     Status: None   Collection Time: 06/20/14  3:40 AM  Result Value Ref Range Status   Specimen Description STOOL  Final   Special Requests NONE  Final   Culture   Final    NO SALMONELLA, SHIGELLA, CAMPYLOBACTER, YERSINIA, OR E.COLI 0157:H7 ISOLATED Performed at [facility]    Report Status 06/24/2014 FINAL  Final     Studies: No results found.  Scheduled Meds: . acidophilus  1 capsule Oral Daily  . amitriptyline  100 mg Oral QHS  . cyanocobalamin  500 mcg Oral BID  . dicyclomine  10 mg Oral TID AC & HS  . enoxaparin (LOVENOX) injection  40 mg Subcutaneous Q24H  . feeding supplement (GLUCERNA SHAKE)  237 mL Oral TID BM  . gabapentin  300 mg Oral QHS  . insulin aspart  0-15 Units Subcutaneous TID WC  . insulin aspart  7 Units Subcutaneous TID WC  . insulin glargine  20 Units Subcutaneous q morning - 10a  . ketorolac  1 drop Left Eye QID  . loperamide  4 mg Oral BID  . multivitamin with minerals  1 tablet Oral q morning - 10a  . ofloxacin  1 drop Left Eye QID  . prednisoLONE acetate  1 drop Left Eye QID   Continuous Infusions:     Time spent: [place], [place]  [facility] Pager [phone] If 7PM-7AM, please contact night-coverage at [email or web address], password [facility] 06/24/2014, 6:05 PM  LOS: 5 days

## 2014-06-24 NOTE — Op Note (Signed)
[facility] [place], [address]   FLEX SIGMOIDOSCOPY PROCEDURE REPORT  PATIENT: [name], [name]  MR#: [id number] BIRTHDATE: 1980-03-31 , 33  yrs. old GENDER: female ENDOSCOPIST: Milus Banister, MD REFERRED BY: PROCEDURE DATE:  13-Jul-2014 PROCEDURE:   colonoscopy with biopsy INDICATIONS:chronic diarrhea, recent C.  diff + by PCR. MEDICATIONS: Fentanyl 100 mcg IV and Versed 8 mg IV  DESCRIPTION OF PROCEDURE:    Physical exam was performed.  Informed consent was obtained from the patient after explaining the benefits, risks, and alternatives to procedure.  The patient was connected to monitor and placed in left lateral position. Continuous oxygen was provided by nasal cannula and IV medicine administered through an indwelling cannula.  After administration of sedation and rectal exam, the patients rectum was intubated and the [id number] ([id number])  colonoscope was advanced under direct visualization to the cecum.  The scope was removed slowly by carefully examining the color, texture, anatomy, and integrity mucosa on the way out.  The patient was recovered in endoscopy and discharged home in satisfactory condition.    COLON FINDINGS: There was yellowish flecks of exudate, stool throughout the colon but NONE was adherent or suggestive of pseudomembranes and the underlying mucosa appeared normal.  Given lack of clear diagnosis, I proceeded to evaluate the right colon and terminal ileum, both of which were normal.  The colon was randomly biopsied.  PREP QUALITY: The overall prep quality was good.  COMPLICATIONS: None  ENDOSCOPIC IMPRESSION: There was yellowish flecks of exudate, stool throughout the colon but NONE was adherent or suggestive of pseudomembranes and the underlying mucosa appeared normal.  Given lack of clear diagnosis, I proceeded to evaluate the right colon and terminal ileum, both of which were normal.  The colon was randomly  biopsied  RECOMMENDATIONS: She does not have psuedomembranous colitis. 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  Will start imoduim 1-2 pills twice daily for now.  Will also check for serologic evidence of celiac sprue with bloodwork today.  Await final pathology. Continue IV hydration. will stop flagyl.      _______________________________ eSigned:  Milus Banister, MD 07/13/14 10:40 AM   CPT CODES: ICD CODES:  The ICD and CPT codes recommended by this software are interpretations from the data that the clinical staff has captured with the software.  The verification of the translation of this report to the ICD and CPT codes and modifiers is the sole responsibility of the health care institution and practicing physician where this report was generated.  [employer]. will not be held responsible for the validity of the ICD and CPT codes included on this report.  AMA assumes no liability for data contained or not contained herein. CPT is a Designer, television/film set of the [employer].   PATIENT NAME:  [name], [name] MR#: [id number]

## 2014-06-24 NOTE — H&P (View-Only) (Signed)
Progress Note   Subjective  *Diarrhea is perhaps slightly decreased in frequency.  Still complains of abdominal discomfort.**   Objective  Vital signs in last 24 hours: Temp:  [98.5 F (36.9 C)] 98.5 F (36.9 C) (05/01 0425) Pulse Rate:  [83-86] 83 (05/01 0425) Resp:  [12-16] 12 (05/01 0425) BP: (109-116)/(71-86) 109/71 mmHg (05/01 0425) SpO2:  [100 %] 100 % (05/01 0425) Last BM Date: 06/22/14  General: Alert, well-developed,  in NAD Heart:  Regular rate and rhythm; no murmurs Chest: Clear to ascultation bilaterally Abdomen:  Soft, mildly diffusely tender Extremities:  Without edema. Neurologic:  Alert and  oriented x4; grossly normal neurologically. Psych:  Alert and cooperative. Normal mood and affect.  Intake/Output from previous day: 04/30 0701 - 05/01 0700 In: 4582.5 [P.O.:2520; I.V.:2062.5] Out: 800 [Urine:400; Stool:400] Intake/Output this shift: Total I/O In: 360 [P.O.:360] Out: -   Lab Results:  Recent Labs  06/21/14 0415 06/22/14 0428  WBC 5.3 4.5  HGB 11.7* 10.7*  HCT 34.0* 32.3*  PLT 232 221   BMET  Recent Labs  06/21/14 0415 06/22/14 0428  NA 136 139  K 4.1 3.9  CL 105 104  CO2 24 27  GLUCOSE 326* 196*  BUN 6 5*  CREATININE 0.51 0.48*  CALCIUM 8.5 8.6   LFT  Recent Labs  06/22/14 0428  PROT 5.5*  ALBUMIN 3.1*  AST 27  ALT 31  ALKPHOS 49  BILITOT 0.3   PT/INR No results for input(s): LABPROT, INR in the last 72 hours. Hepatitis Panel No results for input(s): HEPBSAG, HCVAB, HEPAIGM, HEPBIGM in the last 72 hours.  Studies/Results: Ct Abdomen Pelvis W Contrast  06/21/2014   CLINICAL DATA:  34 year old female with diarrhea, weight loss and gastric paresis. History of C difficile colitis.  EXAM: CT ABDOMEN AND PELVIS WITH CONTRAST  TECHNIQUE: Multidetector CT imaging of the abdomen and pelvis was performed using the standard protocol following bolus administration of intravenous contrast.  CONTRAST:  54mL OMNIPAQUE IOHEXOL  300 MG/ML  SOLN  COMPARISON:  CT of the abdomen and pelvis 05/30/2014.  FINDINGS: Lower chest:  Unremarkable.  Hepatobiliary: No cystic or solid hepatic lesions. No intra or extrahepatic biliary ductal dilatation. Gallbladder is normal in appearance.  Pancreas: Unremarkable.  Spleen: Several small splenic granulomas are noted.  Adrenals/Urinary Tract: Bilateral adrenal glands and bilateral kidneys are normal in appearance. No hydroureteronephrosis. Urinary bladder is normal in appearance.  Stomach/Bowel: Normal appearance of the stomach. No pathologic dilatation of small bowel or colon. There continues to be some mural thickening in the sigmoid colon and rectum, however, the surrounding haziness in the adjacent mesorectal and mesocolic fat noted on the prior study has largely resolved. The more proximal aspects of the colon are otherwise normal in appearance. Normal appendix.  Vascular/Lymphatic: No atherosclerotic disease or aneurysm identified in the abdominal or pelvic vasculature. No lymphadenopathy noted in the abdomen or pelvis.  Reproductive: Uterus and ovaries are unremarkable in appearance.  Other: No significant volume of ascites.  No pneumoperitoneum.  Musculoskeletal: There are no aggressive appearing lytic or blastic lesions noted in the visualized portions of the skeleton.  IMPRESSION: 1. Although the inflammatory changes around the sigmoid colon and rectum have largely resolved compared to the prior study, there appears to be some residual mural thickening throughout the distal sigmoid colon and rectum, which may indicate some residual colitis. This appears improved. The remainder the colon is otherwise normal in appearance.   Electronically Signed   By: Quillian Quince  Entrikin M.D.   On: 06/21/2014 14:37      Assessment & Plan  *Diarrhea - there is no evidence for pseudomembranous colitis as determined by*stool studies but she continues to have requested high-volume stools.  This may represent a post  infectious colitis or IBS.  Inflammatory changes on CT have regressed.  Plan to quantitate stool output.  If not improved over next 24 hours would consider sigmoidoscopy * Active Problems:   DM (diabetes mellitus), type 1, uncontrolled   Diarrhea   Nausea vomiting and diarrhea   Metabolic acidosis   Generalized abdominal pain   Personal history of noncompliance with medical treatment     LOS: 4 days   Erskine Emery  06/23/2014, 9:21 AM

## 2014-06-24 NOTE — Progress Notes (Signed)
Patient has had a total of 5 watery stools today (6:30a to 7:00p).  Of the 5 stools, I personally saw and documented 4, total volume was 2,325 cc's, 1 stool was not documented/measured and patient reported that she had removed the toilet hat to vomit. Patient reports 3 episodes of vomiting, 1 episode was witnessed & measured 275 cc's. Additional information if needed is recorded in patient's I&O flowsheets.  Please note the previous measurements do not include any I&O amounts related to the prep enemas given to patient prior to her procedure this morning. Will report the above to the oncoming night RN and NT in order to continue strict monitoring of patient's symptoms. Christen Bame RN

## 2014-06-24 NOTE — Interval H&P Note (Signed)
History and Physical Interval Note:  06/24/2014 9:48 AM  [name]  has presented today for surgery, with the diagnosis of diarrhea, abd pain  The various methods of treatment have been discussed with the patient and family. After consideration of risks, benefits and other options for treatment, the patient has consented to  Procedure(s): FLEXIBLE SIGMOIDOSCOPY (N/A) as a surgical intervention .  The patient's history has been reviewed, patient examined, no change in status, stable for surgery.  I have reviewed the patient's chart and labs.  Questions were answered to the patient's satisfaction.     Milus Banister

## 2014-06-25 ENCOUNTER — Encounter (HOSPITAL_COMMUNITY): Payer: Self-pay | Admitting: Gastroenterology

## 2014-06-25 LAB — ENDOMYSIAL IGA ANTIBODY: Endomysial IgA Autoabs: NEGATIVE

## 2014-06-25 LAB — TISSUE TRANSGLUTAMINASE, IGA: Tissue Transglutaminase Ab, IgA: <2 U/mL (ref 0–3)

## 2014-06-25 LAB — GLUCOSE, CAPILLARY
GLUCOSE-CAPILLARY: 506 mg/dL — AB (ref 70–99)
GLUCOSE-CAPILLARY: 449 mg/dL — AB (ref 70–99)
Glucose-Capillary: 477 mg/dL — ABNORMAL HIGH (ref 70–99)
Glucose-Capillary: 432 mg/dL — ABNORMAL HIGH (ref 70–99)
Glucose-Capillary: 324 mg/dL — ABNORMAL HIGH (ref 70–99)
Glucose-Capillary: 396 mg/dL — ABNORMAL HIGH (ref 70–99)

## 2014-06-25 LAB — IGA: IGA: 216 mg/dL (ref 87–352)

## 2014-06-25 MED ORDER — INSULIN ASPART 100 UNIT/ML ~~LOC~~ SOLN
0.0000 [IU] | Freq: Three times a day (TID) | SUBCUTANEOUS | Status: DC
  Administered 2014-06-25: 2 [IU] via SUBCUTANEOUS
  Administered 2014-06-26: 7 [IU] via SUBCUTANEOUS
  Administered 2014-06-26: 12 [IU] via SUBCUTANEOUS

## 2014-06-25 MED ORDER — INSULIN ASPART 100 UNIT/ML ~~LOC~~ SOLN
7.0000 [IU] | Freq: Three times a day (TID) | SUBCUTANEOUS | Status: DC
  Administered 2014-06-25: 7 [IU] via SUBCUTANEOUS

## 2014-06-25 MED ORDER — ENOXAPARIN SODIUM 30 MG/0.3ML ~~LOC~~ SOLN
30.0000 mg | SUBCUTANEOUS | Status: DC
Start: 2014-06-26 — End: 2014-06-26
  Administered 2014-06-26: 30 mg via SUBCUTANEOUS
  Filled 2014-06-25: qty 0.3

## 2014-06-25 MED ORDER — HYDROMORPHONE HCL 1 MG/ML IJ SOLN
1.0000 mg | Freq: Four times a day (QID) | INTRAMUSCULAR | Status: DC | PRN
  Administered 2014-06-25 – 2014-06-28 (×11): 1 mg via INTRAVENOUS
  Filled 2014-06-25 (×11): qty 1

## 2014-06-25 MED ORDER — INSULIN GLARGINE 100 UNIT/ML ~~LOC~~ SOLN
15.0000 [IU] | Freq: Every morning | SUBCUTANEOUS | Status: DC
  Filled 2014-06-25: qty 0.15

## 2014-06-25 MED ORDER — INSULIN ASPART 100 UNIT/ML ~~LOC~~ SOLN: 12.0000 [IU] | Freq: Three times a day (TID) | SUBCUTANEOUS | Status: DC

## 2014-06-25 MED ORDER — INSULIN GLARGINE 100 UNIT/ML ~~LOC~~ SOLN
30.0000 [IU] | Freq: Every morning | SUBCUTANEOUS | Status: DC
  Administered 2014-06-25: 30 [IU] via SUBCUTANEOUS
  Filled 2014-06-25: qty 0.3

## 2014-06-25 MED ORDER — INSULIN ASPART 100 UNIT/ML ~~LOC~~ SOLN
5.0000 [IU] | Freq: Once | SUBCUTANEOUS | Status: AC
  Administered 2014-06-25: 5 [IU] via SUBCUTANEOUS

## 2014-06-25 NOTE — Progress Notes (Signed)
Pt's blood sugar this morning was 477. MD notified. Per sliding scale and meal coverage orders, patient would have received 21 units of novolog. Dr. Clementeen Graham made changes to her insulin orders to reflect recent increases in sugar levels. The patient, however, refused to take the full dose of ordered insulin. She believes that her situation is not understood and she said her body can not tolerate that much insulin at a time, and she stated that she only wanted 7 units and no more. She also keeps requesting orange juice because she's so fearful of becoming hypoglycemic. I tried to educate the patient on the reason for the insulin changes, and the dangers of having such a high sugar level for a prolonged period of time. After 7 units of Novolog and 30 units of lantus, patient still having readings in 400s. RN using this opportunity to educate patient further.

## 2014-06-25 NOTE — Progress Notes (Signed)
States no nausea but hurting " real bad". Phenergan given upon request; patient states to prevent vomiting.

## 2014-06-25 NOTE — Progress Notes (Signed)
Inpatient Diabetes Program Recommendations  AACE/ADA: New Consensus Statement on Inpatient Glycemic Control (2013)  Target Ranges:  Prepandial:   less than 140 mg/dL      Peak postprandial:   less than 180 mg/dL (1-2 hours)      Critically ill patients:  140 - 180 mg/dL     Results for [name], [name] (MRN [id number]) as of 06/25/2014 11:21  Ref. Range 06/24/2014 07:51 06/24/2014 11:11 06/24/2014 17:53 06/24/2014 21:31 06/24/2014 23:36  Glucose-Capillary Latest Ref Range: 70-99 mg/dL 309 (H) 365 (H) 346 (H) 456 (H) 294 (H)    Results for [name], [name] (MRN [id number]) as of 06/25/2014 11:21  Ref. Range 06/25/2014 07:27 06/25/2014 09:33 06/25/2014 10:43  Glucose-Capillary Latest Ref Range: 70-99 mg/dL 477 (H) 506 (H) 432 (H)     Home DM Meds: Lantus 15 units QAM       Novolog 1 unit for every 100 mg/dl above target CBG of 100 mg/dl       Novolog 1 unit for every 15 grams of carbohydrates consumed  Current DM Orders: Lantus 15 units daily            Novolog Resistant SSI            Novolog 7 units tidwc     **Patient did NOT get her Lantus yesterday (05/02).  As a result, AM CBG today was 477 mg/dl.  **Lantus dose was increased to 30 units daily and Novolog Meal Coverage was increased to 12 units tidwc as a result.  **Called Dr. Clementeen Graham to discuss insulin dosage increases made this AM.  Dr. Clementeen Graham not aware that patient did not receive Lantus yesterday.    **Reviewed Dr. Arman Filter office visit note with Dr. Clementeen Graham (patient's Endocrinologist with [facility]- patient saw Dr. Cruzita Lederer on 05/09/14).  Per Dr. Arman Filter notes, patient is supposed to take Lantus 15 units QAM + Novolog 1 unit for every 100 mg/dl above target CBG of 100 mg/dl + Novolog 1 unit for every 15 grams of carbohydrates consumed by patient.  **Dr. Clementeen Graham gave me telephone orders to 1) Decrease Lantus to 15 units QAM (start lower dose tomorrow- 05/04) and 2) Decrease Novolog Meal Coverage to 7 units tidwc.  **Called RN to review  modified insulin orders.  Asked RN to please be vigilant for s/sxs of Hypoglycemia for patient as she received double the amount of Lantus she normally takes at home.      Will follow Wyn Quaker RN, MSN, CDE Diabetes Coordinator Inpatient Diabetes Program Team Pager: [phone] (8a-5p)

## 2014-06-25 NOTE — Progress Notes (Signed)
[facility] PROGRESS NOTE  [name] OZD:[id number] DOB: 28-Nov-1980 DOA: 06/19/2014 PCP: [name], MD  Brief narrative 34 year old female with history of uncontrolled type 1 diabetes mellitus, recent hospitalization for DKA thought to be secondary to C. difficile returned to the ED with 24-hour history of nausea, vomiting, abdominal pain with diarrhea. CT of the abdomen and pelvis showed a resolved inflammatory changes around sigmoid colon and rectum with some residual thickening throughout distal sigmoid colon and rectum suggestive residual colitis. Patient seen by GI and underwent flexible sigmoidoscopy on 5/2 for persistent diarrhea which was negative for pseudomembranous colitis.   Assessment/Plan: Ongoing diarrhea Etiology unclear. 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 C. difficile negative. GI started patient on Imodium. Diarrhea much improved. Pending serology for celiac sprue.  Flagyl discontinued. Continue dicyclomine and amitriptyline. Added prn dilaudid for pain.   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 No further nausea and vomiting today. Continue current pain regimen. Added low dose when necessary Dilaudid for pain. Fingersticks elevated. Did not receive lantus yesterday for reason unclear. Received 30 u  lantus this am ( is on 15 u at home). resume premeal aspart at 7 u tid and monitor Continue Phenergan for nausea and vomiting. Last A1c of 11.8.sees Dr Cruzita Lederer.  Protein calorie malnutrition Added supplement.  Diet: Diabetic  DVT prophylaxis: Subcutaneous Lovenox   Consults: [facility]  Procedure:  Flexible sigmoidoscopy on 5/2 CT abdomen and pelvis  Code Status: Full code Family Communication: none at bedside Disposition Plan: Home once symptoms improved      Antibiotics:  Flagyl  HPI/Subjective: Diarrhea  improved ( only 3 episodes past 24 hrs)  Reports some nausea and ? episodes of  vomiting. C/o abd pain  Objective: Filed Vitals:   06/25/14 1453  BP: 100/67  Pulse: 118  Temp: 98.5 F (36.9 C)  Resp: 16    Intake/Output Summary (Last 24 hours) at 06/25/14 1511 Last data filed at 06/25/14 1300  Gross per 24 hour  Intake   2707 ml  Output   3940 ml  Net  -1233 ml   Filed Weights   06/20/14 0015 06/25/14 0434  Weight: 44 kg (97 lb) 40.7 kg (89 lb 11.6 oz)    Exam:   General:  Young thin built female in no acute distress  HEENT: moist oral mucosa, supple neck  Chest: Clear to auscultation bilaterally, no added sounds  CVS: Normal S1 and S2, no murmurs or gallop  GI: Soft, nondistended,  diffuse tenderness, bowel  sounds present  Musculoskeletal: Warm, no edema  CNS: Alert and oriented    Data Reviewed: Basic Metabolic Panel:  Recent Labs Lab 06/19/14 1955 06/19/14 2106 06/20/14 0438 06/21/14 0415 06/22/14 0428  NA 136 131* 130* 136 139  K 4.1 4.1 3.9 4.1 3.9  CL 102 106 105 105 104  CO2  --  19 18* 24 27  GLUCOSE 229* 240* 251* 326* 196*  BUN 11 12 11 6  5*  CREATININE 0.60 0.55 0.54 0.51 0.48*  CALCIUM  --  8.9 8.2* 8.5 8.6   Liver Function Tests:  Recent Labs Lab 06/21/14 0415 06/22/14 0428  AST 26 27  ALT 30 31  ALKPHOS 55 49  BILITOT 0.4 0.3  PROT 5.8* 5.5*  ALBUMIN 3.4* 3.1*   No results for input(s): LIPASE, AMYLASE in the last 168 hours. No results for input(s): AMMONIA in the last 168 hours. CBC:  Recent Labs  Lab 06/19/14 1955 06/19/14 2106 06/20/14 0438 06/21/14 0415 06/22/14 0428  WBC  --  8.3 9.1 5.3 4.5  NEUTROABS  --  4.8 5.1  --   --   HGB 15.6* 14.6 11.7* 11.7* 10.7*  HCT 46.0 42.8 34.9* 34.0* 32.3*  MCV  --  88.4 89.7 90.2 90.7  PLT  --  330 258 232 221   Cardiac Enzymes: No results for input(s): CKTOTAL, CKMB, CKMBINDEX, TROPONINI in the last 168 hours. BNP (last 3 results) No results for input(s): BNP in the  last 8760 hours.  ProBNP (last 3 results) No results for input(s): PROBNP in the last 8760 hours.  CBG:  Recent Labs Lab 06/24/14 2336 06/25/14 0727 06/25/14 0933 06/25/14 1043 06/25/14 1209  GLUCAP 294* 477* 506* 432* 449*    Recent Results (from the past 240 hour(s))  Clostridium Difficile by PCR     Status: None   Collection Time: 06/20/14  3:40 AM  Result Value Ref Range Status   C difficile by pcr NEGATIVE NEGATIVE Final  Stool culture     Status: None   Collection Time: 06/20/14  3:40 AM  Result Value Ref Range Status   Specimen Description STOOL  Final   Special Requests NONE  Final   Culture   Final    NO SALMONELLA, SHIGELLA, CAMPYLOBACTER, YERSINIA, OR E.COLI 0157:H7 ISOLATED Performed at [facility]    Report Status 06/24/2014 FINAL  Final     Studies: No results found.  Scheduled Meds: . acidophilus  1 capsule Oral Daily  . amitriptyline  100 mg Oral QHS  . cyanocobalamin  500 mcg Oral BID  . dicyclomine  10 mg Oral TID AC & HS  . [START ON 06/26/2014] enoxaparin (LOVENOX) injection  30 mg Subcutaneous Q24H  . feeding supplement (GLUCERNA SHAKE)  237 mL Oral TID BM  . gabapentin  300 mg Oral QHS  . insulin aspart  0-20 Units Subcutaneous TID WC  . insulin aspart  7 Units Subcutaneous TID WC  . [START ON 06/26/2014] insulin glargine  15 Units Subcutaneous q morning - 10a  . ketorolac  1 drop Left Eye QID  . loperamide  4 mg Oral BID  . multivitamin with minerals  1 tablet Oral q morning - 10a  . ofloxacin  1 drop Left Eye QID  . prednisoLONE acetate  1 drop Left Eye QID   Continuous Infusions:     Time spent: [place], [place]  [facility] Pager [phone] If 7PM-7AM, please contact night-coverage at [email or web address], password [facility] 06/25/2014, 3:11 PM  LOS: 6 days

## 2014-06-25 NOTE — Progress Notes (Signed)
[facility] Progress Note   Subjective: Flex sig yesterday, converted to full colonoscopy; see report in chart.  Biopsies taken from colon, celiac serologies sent, imodium started.   She had less diarrhea overnight (about 1/2 as much).    Still has abd pain, asked to have pain meds sooner   Objective: Vital signs in last 24 hours: Temp:  [98.3 F (36.8 C)-98.9 F (37.2 C)] 98.9 F (37.2 C) (05/03 0434) Pulse Rate:  [70-118] 104 (05/03 0434) Resp:  [11-25] 14 (05/03 0434) BP: (95-118)/(61-85) 99/61 mmHg (05/03 0434) SpO2:  [97 %-100 %] 98 % (05/03 0434) Weight:  [89 lb 11.6 oz (40.7 kg)] 89 lb 11.6 oz (40.7 kg) (05/03 0434) Last BM Date: 06/24/14 General: alert and oriented times 3 Heart: regular rate and rythm Abdomen: soft, non-tender, non-distended, normal bowel sounds    Assessment/Plan: 34 y.o. female with diarrhea  Post infectious IBS?  Scheduled imodium started.  ? Celiac sprue; her mother feels MUCH better off gluten and her brother may have been diagnosed with sprue as well.  Await lab tests and colon biopsies.     Milus Banister, MD  06/25/2014, 7:22 AM [facility] Pager [phone]

## 2014-06-26 DIAGNOSIS — E10649 Type 1 diabetes mellitus with hypoglycemia without coma: Secondary | ICD-10-CM

## 2014-06-26 LAB — HEPATIC FUNCTION PANEL
ALK PHOS: 57 U/L (ref 38–126)
ALT: 48 U/L (ref 14–54)
AST: 47 U/L — ABNORMAL HIGH (ref 15–41)
Albumin: 4.1 g/dL (ref 3.5–5.0)
BILIRUBIN DIRECT: 0.1 mg/dL (ref 0.1–0.5)
BILIRUBIN INDIRECT: 0.4 mg/dL (ref 0.3–0.9)
TOTAL PROTEIN: 7 g/dL (ref 6.5–8.1)
Total Bilirubin: 0.5 mg/dL (ref 0.3–1.2)

## 2014-06-26 LAB — GLUCOSE, CAPILLARY
GLUCOSE-CAPILLARY: 244 mg/dL — AB (ref 70–99)
GLUCOSE-CAPILLARY: 42 mg/dL — AB (ref 70–99)
GLUCOSE-CAPILLARY: 188 mg/dL — AB (ref 70–99)
GLUCOSE-CAPILLARY: 33 mg/dL — AB (ref 70–99)
Glucose-Capillary: 292 mg/dL — ABNORMAL HIGH (ref 70–99)
Glucose-Capillary: 321 mg/dL — ABNORMAL HIGH (ref 70–99)
Glucose-Capillary: 41 mg/dL — CL (ref 70–99)
Glucose-Capillary: 369 mg/dL — ABNORMAL HIGH (ref 70–99)

## 2014-06-26 LAB — GI PATHOGEN PANEL BY PCR, STOOL
C DIFFICILE TOXIN A/B: NOT DETECTED
CAMPYLOBACTER BY PCR: NOT DETECTED
Cryptosporidium by PCR: NOT DETECTED
E coli (ETEC) LT/ST: NOT DETECTED
E coli (STEC): NOT DETECTED
E coli 0157 by PCR: NOT DETECTED
G lamblia by PCR: NOT DETECTED
Norovirus GI/GII: NOT DETECTED
ROTAVIRUS A BY PCR: NOT DETECTED
SALMONELLA BY PCR: NOT DETECTED
Shigella by PCR: NOT DETECTED

## 2014-06-26 LAB — BASIC METABOLIC PANEL
ANION GAP: 10 (ref 5–15)
BUN: 22 mg/dL — ABNORMAL HIGH (ref 6–20)
CO2: 24 mmol/L (ref 22–32)
CREATININE: 0.66 mg/dL (ref 0.44–1.00)
Calcium: 9 mg/dL (ref 8.9–10.3)
Chloride: 97 mmol/L — ABNORMAL LOW (ref 101–111)
GFR calc non Af Amer: >60 mL/min (ref >60)
GLUCOSE: 407 mg/dL — AB (ref 70–99)
POTASSIUM: 4.8 mmol/L (ref 3.5–5.1)
Sodium: 131 mmol/L — ABNORMAL LOW (ref 135–145)

## 2014-06-26 MED ORDER — COLESTIPOL HCL 1 G PO TABS
1.0000 g | ORAL_TABLET | Freq: Two times a day (BID) | ORAL | Status: DC
  Administered 2014-06-26 – 2014-07-02 (×13): 1 g via ORAL
  Filled 2014-06-26 (×14): qty 1

## 2014-06-26 MED ORDER — INSULIN ASPART 100 UNIT/ML ~~LOC~~ SOLN: 0.0000 [IU] | Freq: Three times a day (TID) | SUBCUTANEOUS | Status: DC

## 2014-06-26 MED ORDER — INSULIN ASPART 100 UNIT/ML ~~LOC~~ SOLN
9.0000 [IU] | Freq: Three times a day (TID) | SUBCUTANEOUS | Status: DC
  Administered 2014-06-26: 2 [IU] via SUBCUTANEOUS

## 2014-06-26 MED ORDER — DEXTROSE-NACL 5-0.45 % IV SOLN
INTRAVENOUS | Status: DC
  Administered 2014-06-26: 21:00:00 via INTRAVENOUS

## 2014-06-26 MED ORDER — DEXTROSE 50 % IV SOLN
INTRAVENOUS | Status: AC
  Administered 2014-06-26: 25 mL
  Filled 2014-06-26: qty 50

## 2014-06-26 MED ORDER — INSULIN GLARGINE 100 UNIT/ML ~~LOC~~ SOLN
14.0000 [IU] | Freq: Every morning | SUBCUTANEOUS | Status: DC
  Administered 2014-06-27 – 2014-06-29 (×3): 14 [IU] via SUBCUTANEOUS
  Filled 2014-06-26 (×3): qty 0.14

## 2014-06-26 MED ORDER — INSULIN GLARGINE 100 UNIT/ML ~~LOC~~ SOLN
20.0000 [IU] | Freq: Every morning | SUBCUTANEOUS | Status: DC
  Administered 2014-06-26: 15 [IU] via SUBCUTANEOUS
  Filled 2014-06-26: qty 0.2

## 2014-06-26 MED ORDER — INSULIN ASPART 100 UNIT/ML ~~LOC~~ SOLN
0.0000 [IU] | Freq: Three times a day (TID) | SUBCUTANEOUS | Status: DC
  Administered 2014-06-27 (×2): 4 [IU] via SUBCUTANEOUS
  Administered 2014-06-28: 6 [IU] via SUBCUTANEOUS
  Administered 2014-06-28: 10 [IU] via SUBCUTANEOUS
  Administered 2014-06-28: 2 [IU] via SUBCUTANEOUS
  Administered 2014-06-29: 15 [IU] via SUBCUTANEOUS
  Administered 2014-06-29: 5 [IU] via SUBCUTANEOUS

## 2014-06-26 MED ORDER — OXYCODONE-ACETAMINOPHEN 5-325 MG PO TABS
2.0000 | ORAL_TABLET | Freq: Four times a day (QID) | ORAL | Status: DC | PRN
  Administered 2014-06-26 – 2014-07-05 (×35): 2 via ORAL
  Filled 2014-06-26 (×36): qty 2

## 2014-06-26 NOTE — Progress Notes (Signed)
     [facility] Progress Note  Subjective:   Still with watery diarrhea but less volme per pt. Says abd cramps are "crippling" and asks for more pain meds. Celiac labs neg. Colon bx with no microscopic colitis or inflammation. Sugars running high. Pt voices concerns over amunt6s of insulin needed to bring her sugars down.   Objective:  Vital signs in last 24 hours: Temp:  [98.5 F (36.9 C)-99 F (37.2 C)] 99 F (37.2 C) (05/04 0500) Pulse Rate:  [100-118] 100 (05/04 0500) Resp:  [16] 16 (05/04 0500) BP: (100-112)/(67-72) 112/72 mmHg (05/04 0500) SpO2:  [98 %-100 %] 100 % (05/04 0500) Last BM Date: 06/25/14 General:   Alert,  Well-developed,    in NAD Heart:  Regular rate and rhythm; no murmurs Pulm;lungs clear Abdomen:  Soft, nontender and nondistended. Normal bowel sounds, without guarding, and without rebound.   Extremities:  Without edema. Neurologic: Alert and  oriented x4;  grossly normal neurologically. Psych: Alert and cooperative. Normal mood and affect.  Intake/Output from previous day: 05/03 0701 - 05/04 0700 In: 69 [P.O.:560] Out: 5300 [Urine:1800; Stool:3500] Intake/Output this shift:    Lab Results: No results for input(s): WBC, HGB, HCT, PLT in the last 72 hours. BMET  Recent Labs  06/26/14 0508  NA 131*  K 4.8  CL 97*  CO2 24  GLUCOSE 407*  BUN 22*  CREATININE 0.66  CALCIUM 9.0        ASSESSMENT/PLAN:   34 y.o. female with diarrhea  Post infectious IBS? Scheduled imodium started. Celiac labs neg, colon bx nl. Mothere would like HLA DQ2 and DQ8 drawn as her son had nl TTG and IgA and still had celiac.Cont inue imodium. May consider trial of colestid. Tighter glycemic control necessary as well.Pt and mom concerned she is not getting adequate nutrition as well.Will give trial of colestid as well.    LOS: 7 days   Hvozdovic, Deloris Ping 06/26/2014, Pager  [phone]    ________________________________________________________________________  [facility] MD note:  I personally examined the patient, reviewed the data and agree with the assessment and plan described above.  Brother was diagnosed with sprue (with genetic testing) and her mother feels [facility] better if she avoids gluten.  I think I prefer to proceed directly to EGD rather than further lab testing to check for path evidence of Celiac Sprue.  Have also started her on cholestyramine empirically.  Will plan for EGD tomorrow.   Owens Loffler, MD [facility] Pager [phone]

## 2014-06-26 NOTE — Progress Notes (Signed)
Discussed this patient with Raeanne Barry, IP Manager and Dr. Baxter Flattery in Infectious Disease regarding Isolation Precautions needs. Dr. Baxter Flattery is ok with leaving this patient off enteric precautions and directed there is no need to move patient to clean room.

## 2014-06-26 NOTE — Progress Notes (Addendum)
[facility] PROGRESS NOTE  ESTLE SABELLA HCW:[id number] DOB: Feb 12, 1981 DOA: 06/19/2014 PCP: [name], MD  Brief narrative 34 year old female with history of uncontrolled type 1 diabetes mellitus, recent hospitalization for DKA thought to be secondary to C. difficile returned to the ED with 24-hour history of nausea, vomiting, abdominal pain with diarrhea. CT of the abdomen and pelvis showed a resolved inflammatory changes around sigmoid colon and rectum with some residual thickening throughout distal sigmoid colon and rectum suggestive residual colitis. Patient seen by GI and underwent flexible sigmoidoscopy on 5/2 for persistent diarrhea which was negative for pseudomembranous colitis. Biopsies negative.   Assessment/Plan: Ongoing diarrhea Etiology unclear. 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 C. difficile negative. GI started patient on Imodium. Diarrhea resolved at this time but continues to have abdominal pain. Pending serology for celiac sprue negative Continue dicyclomine and amitriptyline. Continue when necessary Percocet and dilated for pain. Given persistent abdominal pain symptoms. GI plan on EGD tomorrow to evaluate for sprue. (History of brother diagnosed with sprue)   Uncontrolled type 1 diabetes mellitus with gastroparesis Last A1c of 11.8. She is Dr. Cruzita Lederer. Patient with uncontrolled blood sugar with episodes of hyperglycemia followed by hypoglycemia this afternoon. Glucose ranging in 250-350s past 24 hours and then dropped to 30s this afternoon. Given half an amp of dextrose with improvement. - continue Lantus 15 units daily and keep her on sensitive sliding scale insulin only. (Hold pre-meal aspart to avoid further hypoglycemia). Encouraged bedtime snack. Since patient will be nothing by mouth after midnight will place her on gentle D5 in the evening. If blood glucose remains uncontrolled I will  consult her endocrinologist tomorrow.  Protein calorie malnutrition Added supplement.  Diet: Diabetic  DVT prophylaxis: Subcutaneous Lovenox   Consults: [facility]  Procedure:  Flexible sigmoidoscopy on 5/2 CT abdomen and pelvis EGD on 5/5  Code Status: Full code Family Communication: Mother at bedside Disposition Plan: EGD planned for tomorrow. Discharge date undecided at this time given ongoing abdominal pain and uncontrolled blood sugar with episodes of hyper and hypoglycemia.      Antibiotics:  Flagyl( stopped)  HPI/Subjective: No further diarrhea. Still has abdominal pain and asking for pain medications. Patient hyperglycemic for the past 24 hours Lantus and meal aspart dose increased but patient became hypoglycemic this afternoon . Patient diaphoretic during my evaluation.   Objective: Filed Vitals:   06/26/14 1422  BP: 99/61  Pulse: 97  Temp: 98.2 F (36.8 C)  Resp: 16    Intake/Output Summary (Last 24 hours) at 06/26/14 1603 Last data filed at 06/26/14 1300  Gross per 24 hour  Intake      0 ml  Output   5000 ml  Net  -5000 ml   Filed Weights   06/20/14 0015 06/25/14 0434  Weight: 44 kg (97 lb) 40.7 kg (89 lb 11.6 oz)    Exam:   General:  Young thin built female in no acute distress  HEENT: moist oral mucosa, supple neck  Chest: Clear to auscultation bilaterally, no added sounds  CVS: Normal S1 and S2, no murmurs or gallop  GI: Soft, nondistended,  mild diffuse tenderness, bowel  sounds present  Musculoskeletal: Warm, no edema, diaphoretic  CNS: Alert and oriented    Data Reviewed: Basic Metabolic Panel:  Recent Labs Lab 06/19/14 2106 06/20/14 0438 06/21/14 0415 06/22/14 0428 06/26/14 0508  NA 131* 130* 136 139 131*  K 4.1 3.9 4.1 3.9 4.8  CL 106 105 105  104 97*  CO2 19 18* 24 27 24   GLUCOSE 240* 251* 326* 196* 407*  BUN 12 11 6  5* 22*  CREATININE 0.55 0.54 0.51 0.48* 0.66  CALCIUM 8.9 8.2* 8.5 8.6 9.0   Liver  Function Tests:  Recent Labs Lab 06/21/14 0415 06/22/14 0428 06/26/14 0508  AST 26 27 47*  ALT 30 31 48  ALKPHOS 55 49 57  BILITOT 0.4 0.3 0.5  PROT 5.8* 5.5* 7.0  ALBUMIN 3.4* 3.1* 4.1   No results for input(s): LIPASE, AMYLASE in the last 168 hours. No results for input(s): AMMONIA in the last 168 hours. CBC:  Recent Labs Lab 06/19/14 1955 06/19/14 2106 06/20/14 0438 06/21/14 0415 06/22/14 0428  WBC  --  8.3 9.1 5.3 4.5  NEUTROABS  --  4.8 5.1  --   --   HGB 15.6* 14.6 11.7* 11.7* 10.7*  HCT 46.0 42.8 34.9* 34.0* 32.3*  MCV  --  88.4 89.7 90.2 90.7  PLT  --  330 258 232 221   Cardiac Enzymes: No results for input(s): CKTOTAL, CKMB, CKMBINDEX, TROPONINI in the last 168 hours. BNP (last 3 results) No results for input(s): BNP in the last 8760 hours.  ProBNP (last 3 results) No results for input(s): PROBNP in the last 8760 hours.  CBG:  Recent Labs Lab 06/25/14 1716 06/25/14 2124 06/26/14 0240 06/26/14 0835 06/26/14 1229  GLUCAP 324* 396* 292* 321* 244*    Recent Results (from the past 240 hour(s))  Clostridium Difficile by PCR     Status: None   Collection Time: 06/20/14  3:40 AM  Result Value Ref Range Status   C difficile by pcr NEGATIVE NEGATIVE Final  Stool culture     Status: None   Collection Time: 06/20/14  3:40 AM  Result Value Ref Range Status   Specimen Description STOOL  Final   Special Requests NONE  Final   Culture   Final    NO SALMONELLA, SHIGELLA, CAMPYLOBACTER, YERSINIA, OR E.COLI 0157:H7 ISOLATED Performed at [facility]    Report Status 06/24/2014 FINAL  Final     Studies: No results found.  Scheduled Meds: . acidophilus  1 capsule Oral Daily  . amitriptyline  100 mg Oral QHS  . colestipol  1 g Oral Q12H  . cyanocobalamin  500 mcg Oral BID  . dicyclomine  10 mg Oral TID AC & HS  . feeding supplement (GLUCERNA SHAKE)  237 mL Oral TID BM  . gabapentin  300 mg Oral QHS  . insulin aspart  0-20 Units Subcutaneous  TID WC  . insulin aspart  9 Units Subcutaneous TID WC  . insulin glargine  20 Units Subcutaneous q morning - 10a  . ketorolac  1 drop Left Eye QID  . loperamide  4 mg Oral BID  . multivitamin with minerals  1 tablet Oral q morning - 10a  . ofloxacin  1 drop Left Eye QID  . prednisoLONE acetate  1 drop Left Eye QID   Continuous Infusions:     Time spent: [place], [place] [facility] Pager [phone] If 7PM-7AM, please contact night-coverage at [email or web address], password [facility] 06/26/2014, 4:03 PM  LOS: 7 days

## 2014-06-27 ENCOUNTER — Inpatient Hospital Stay (HOSPITAL_COMMUNITY): Payer: Medicaid Other | Admitting: Certified Registered Nurse Anesthetist

## 2014-06-27 ENCOUNTER — Encounter (HOSPITAL_COMMUNITY): Payer: Self-pay | Admitting: Gastroenterology

## 2014-06-27 ENCOUNTER — Encounter (HOSPITAL_COMMUNITY)
Admission: EM | Disposition: A | Discharge status: Home / Self Care | Payer: Self-pay | Source: Home / Self Care | Attending: Internal Medicine

## 2014-06-27 HISTORY — PX: ESOPHAGOGASTRODUODENOSCOPY (EGD) WITH PROPOFOL: SHX5813

## 2014-06-27 LAB — GLUCOSE, CAPILLARY
GLUCOSE-CAPILLARY: 146 mg/dL — AB (ref 70–99)
GLUCOSE-CAPILLARY: 83 mg/dL (ref 70–99)
GLUCOSE-CAPILLARY: 260 mg/dL — AB (ref 70–99)
Glucose-Capillary: 233 mg/dL — ABNORMAL HIGH (ref 70–99)
Glucose-Capillary: 192 mg/dL — ABNORMAL HIGH (ref 70–99)

## 2014-06-27 SURGERY — ESOPHAGOGASTRODUODENOSCOPY (EGD) WITH PROPOFOL
Anesthesia: Monitor Anesthesia Care

## 2014-06-27 MED ORDER — SODIUM CHLORIDE 0.9 % IV SOLN
INTRAVENOUS | Status: DC
  Administered 2014-06-27: 10:00:00 via INTRAVENOUS

## 2014-06-27 MED ORDER — ONDANSETRON HCL 4 MG/2ML IJ SOLN
INTRAMUSCULAR | Status: DC | PRN
Start: 2014-06-27 — End: 2014-06-27
  Administered 2014-06-27: 4 mg via INTRAVENOUS

## 2014-06-27 MED ORDER — PROPOFOL 10 MG/ML IV BOLUS
INTRAVENOUS | Status: AC
  Filled 2014-06-27: qty 20

## 2014-06-27 MED ORDER — PROPOFOL INFUSION 10 MG/ML OPTIME
INTRAVENOUS | Status: DC | PRN
  Administered 2014-06-27: 180 ug/kg/min via INTRAVENOUS

## 2014-06-27 MED ORDER — KETAMINE HCL 10 MG/ML IJ SOLN
INTRAMUSCULAR | Status: AC
  Filled 2014-06-27: qty 1

## 2014-06-27 MED ORDER — LIDOCAINE HCL (CARDIAC) 20 MG/ML IV SOLN
INTRAVENOUS | Status: AC
  Filled 2014-06-27: qty 5

## 2014-06-27 MED ORDER — MIDAZOLAM HCL 2 MG/2ML IJ SOLN
INTRAMUSCULAR | Status: AC
Start: 2014-06-27 — End: 2014-06-27
  Filled 2014-06-27: qty 2

## 2014-06-27 MED ORDER — MIDAZOLAM HCL 5 MG/5ML IJ SOLN
INTRAMUSCULAR | Status: DC | PRN
  Administered 2014-06-27 (×2): 1 mg via INTRAVENOUS

## 2014-06-27 MED ORDER — LIDOCAINE HCL (CARDIAC) 20 MG/ML IV SOLN
INTRAVENOUS | Status: DC | PRN
  Administered 2014-06-27: 100 mg via INTRAVENOUS

## 2014-06-27 MED ORDER — KETAMINE HCL 10 MG/ML IJ SOLN
INTRAMUSCULAR | Status: DC | PRN
  Administered 2014-06-27: 20 mg via INTRAVENOUS

## 2014-06-27 MED ORDER — FENTANYL CITRATE (PF) 100 MCG/2ML IJ SOLN: 25.0000 ug | INTRAMUSCULAR | Status: DC | PRN

## 2014-06-27 MED ORDER — ONDANSETRON HCL 4 MG/2ML IJ SOLN
INTRAMUSCULAR | Status: AC
  Filled 2014-06-27: qty 2

## 2014-06-27 MED ORDER — LACTATED RINGERS IV SOLN
INTRAVENOUS | Status: DC
  Administered 2014-06-27: 1000 mL via INTRAVENOUS

## 2014-06-27 SURGICAL SUPPLY — 14 items

## 2014-06-27 NOTE — Interval H&P Note (Signed)
History and Physical Interval Note:  06/27/2014 7:28 AM  Angie A Keesey  has presented today for surgery, with the diagnosis of diarrhea, check for celiac sprue changes  The various methods of treatment have been discussed with the patient and family. After consideration of risks, benefits and other options for treatment, the patient has consented to  Procedure(s): ESOPHAGOGASTRODUODENOSCOPY (EGD) WITH PROPOFOL (N/A) as a surgical intervention .  The patient's history has been reviewed, patient examined, no change in status, stable for surgery.  I have reviewed the patient's chart and labs.  Questions were answered to the patient's satisfaction.     Milus Banister

## 2014-06-27 NOTE — Op Note (Signed)
[facility] [place], [address]   ENDOSCOPY PROCEDURE REPORT  PATIENT: Mandy, [name]  MR#: [id number] BIRTHDATE: 1981-01-07 , 33  yrs. old GENDER: female ENDOSCOPIST: Milus Banister, MD PROCEDURE DATE:  06/27/2014 PROCEDURE:  EGD w/ biopsy ASA CLASS:     Class III INDICATIONS:  chronic diarrhea, high clinical suspicion for Celiac Sprue. MEDICATIONS: Monitored anesthesia care TOPICAL ANESTHETIC: none  DESCRIPTION OF PROCEDURE: After the risks benefits and alternatives of the procedure were thoroughly explained, informed consent was obtained.  The Pentax Gastroscope [id number] endoscope was introduced through the mouth and advanced to the second portion of the duodenum , Without limitations.  The instrument was slowly withdrawn as the mucosa was fully examined.    There was moderate amount of residual liquid gastric contents without anatomic outlet obstruction.  The mucosa throughout the UGI tract appeared normal.  The duodenum was biopsied and sent to pathology.  Retroflexed views revealed no abnormalities.     The scope was then withdrawn from the patient and the procedure completed.  COMPLICATIONS: There were no immediate complications.  ENDOSCOPIC IMPRESSION: There was moderate amount of residual liquid gastric contents without anatomic outlet obstruction.  The mucosa throughout the UGI tract appeared normal.  The duodenum was biopsied and sent to pathology  RECOMMENDATIONS: Continue scheduled imodium, scheduled cholestyramine.  Await duodenal biopsies, hopefully back tomorrow.   eSigned:  Milus Banister, MD 06/27/2014 8:24 AM   cc: Erskine Emery, MD

## 2014-06-27 NOTE — Anesthesia Preprocedure Evaluation (Signed)
Anesthesia Evaluation  Patient identified by MRN, date of birth, ID bandGeneral Assessment Comment:History noted. CE  History of Anesthesia Complications (+) PONV  Airway Mallampati: I  TM Distance: >3 FB Neck ROM: Full    Dental   Pulmonary former smoker,  breath sounds clear to auscultation        Cardiovascular negative cardio ROS  Rhythm:Regular Rate:Normal     Neuro/Psych    GI/Hepatic Neg liver ROS, GERD-  ,  Endo/Other  diabetes  Renal/GU negative Renal ROS     Musculoskeletal   Abdominal   Peds  Hematology   Anesthesia Other Findings   Reproductive/Obstetrics                             Anesthesia Physical Anesthesia Plan  ASA: II  Anesthesia Plan: MAC   Post-op Pain Management:    Induction: Intravenous  Airway Management Planned: Simple Face Mask  Additional Equipment:   Intra-op Plan:   Post-operative Plan:   Informed Consent: I have reviewed the patients History and Physical, chart, labs and discussed the procedure including the risks, benefits and alternatives for the proposed anesthesia with the patient or authorized representative who has indicated his/her understanding and acceptance.   Dental advisory given  Plan Discussed with: Anesthesiologist and CRNA  Anesthesia Plan Comments:         Anesthesia Quick Evaluation

## 2014-06-27 NOTE — Progress Notes (Signed)
Nutrition Follow-up  DOCUMENTATION CODES:  Severe malnutrition in context of chronic illness  INTERVENTION:  Glucerna shake  NUTRITION DIAGNOSIS:  Malnutrition related to altered GI function as evidenced by severe depletion of body fat and muscle.  ongoing  GOAL:  Weight gain  Not met  MONITOR:  PO intake, Supplement acceptance, Diet advancement, Labs, Weight trends  REASON FOR ASSESSMENT:  Other (Comment) (Low BMI)    ASSESSMENT: 34 y.o. female with history of diabetes mellitus type 1 who was recently admitted for diabetic ketoacidosis and C. difficile colitis and was discharged home on Flagyl and presents to the ER because of worsening diarrhea over the last 24 hours. Patient states over the last 24 hours patient has been having severe diarrhea multiple locations denies any blood in the diarrhea. She also had a couple of episodes of nausea and vomiting. Patient also has been having crampy abdominal pain. Denies any sick contacts or recent travels. Patient states she did take her Lantus today but has not taken her sliding scale coverage as patient was not able to eat well because of the vomiting. Patient's blood sugar is elevated but not in DKA.   Current diet is regular diet.  Today pt has consumed Glucerna Shakes x 3, eggs, grits, pudding, yogurt x 2, and a chicken quesadilla.  She reports that diarrhea is not improved and everything goes straight through her.  5/5 pt s/p EGD to evaluate for Sprue.  Questions answered. Will continue nutritional supplements.  RD will continue to monitor.   Labs and medications reviewed  CBGs 83-233  Na 131  Cl 97  BUN 22  Height:  Ht Readings from Last 1 Encounters:  06/27/14 _0  (1.651 m)    Weight:  Wt Readings from Last 1 Encounters:  06/27/14 95 lb (43.092 kg)    Ideal Body Weight:  56.8 kg  Wt Readings from Last 10 Encounters:  06/27/14 95 lb (43.092 kg)  06/11/14 95 lb (43.092 kg)  05/28/14 99 lb 3.3 oz (45  kg)  05/09/14 100 lb (45.36 kg)  04/02/14 110 lb (49.896 kg)  03/30/14 101 lb 13.6 oz (46.2 kg)  03/25/14 106 lb (48.081 kg)  03/05/14 122 lb 9.2 oz (55.6 kg)  02/19/14 109 lb (49.442 kg)  02/03/14 123 lb 3.8 oz (55.9 kg)    BMI:  Body mass index is 15.81 kg/(m^2).  Estimated Nutritional Needs:  Kcal:  1400-1600  Protein:  80-90 g  Fluid:  1.6 L/day  Skin:  Reviewed, no issues  Diet Order:  Diet regular Room service appropriate?: Yes; Fluid consistency:: Thin  EDUCATION NEEDS:  Education needs addressed   Intake/Output Summary (Last 24 hours) at 06/27/14 1550 Last data filed at 06/27/14 1500  Gross per 24 hour  Intake 2356.5 ml  Output   1500 ml  Net  856.5 ml    Last BM:  5/4  Laurette Schimke [place], [place], [place]

## 2014-06-27 NOTE — Anesthesia Postprocedure Evaluation (Signed)
  Anesthesia Post-op Note  Patient: [name]  Procedure(s) Performed: Procedure(s): ESOPHAGOGASTRODUODENOSCOPY (EGD) WITH PROPOFOL (N/A)  Patient Location: PACU  Anesthesia Type:MAC  Level of Consciousness: awake  Airway and Oxygen Therapy: Patient Spontanous Breathing  Post-op Pain: mild  Post-op Assessment: Post-op Vital signs reviewed  Post-op Vital Signs: Reviewed  Last Vitals:  Filed Vitals:   06/27/14 0826  BP: 97/63  Pulse:   Temp: 36.8 C  Resp: 10    Complications: No apparent anesthesia complications

## 2014-06-27 NOTE — H&P (View-Only) (Signed)
     [facility] Progress Note  Subjective:   Still with watery diarrhea but less volme per pt. Says abd cramps are "crippling" and asks for more pain meds. Celiac labs neg. Colon bx with no microscopic colitis or inflammation. Sugars running high. Pt voices concerns over amunt6s of insulin needed to bring her sugars down.   Objective:  Vital signs in last 24 hours: Temp:  [98.5 F (36.9 C)-99 F (37.2 C)] 99 F (37.2 C) (05/04 0500) Pulse Rate:  [100-118] 100 (05/04 0500) Resp:  [16] 16 (05/04 0500) BP: (100-112)/(67-72) 112/72 mmHg (05/04 0500) SpO2:  [98 %-100 %] 100 % (05/04 0500) Last BM Date: 06/25/14 General:   Alert,  Well-developed,    in NAD Heart:  Regular rate and rhythm; no murmurs Pulm;lungs clear Abdomen:  Soft, nontender and nondistended. Normal bowel sounds, without guarding, and without rebound.   Extremities:  Without edema. Neurologic: Alert and  oriented x4;  grossly normal neurologically. Psych: Alert and cooperative. Normal mood and affect.  Intake/Output from previous day: 05/03 0701 - 05/04 0700 In: 21 [P.O.:560] Out: 5300 [Urine:1800; Stool:3500] Intake/Output this shift:    Lab Results: No results for input(s): WBC, HGB, HCT, PLT in the last 72 hours. BMET  Recent Labs  06/26/14 0508  NA 131*  K 4.8  CL 97*  CO2 24  GLUCOSE 407*  BUN 22*  CREATININE 0.66  CALCIUM 9.0        ASSESSMENT/PLAN:   34 y.o. female with diarrhea  Post infectious IBS? Scheduled imodium started. Celiac labs neg, colon bx nl. Mothere would like HLA DQ2 and DQ8 drawn as her son had nl TTG and IgA and still had celiac.Cont inue imodium. May consider trial of colestid. Tighter glycemic control necessary as well.Pt and mom concerned she is not getting adequate nutrition as well.Will give trial of colestid as well.    LOS: 7 days   Hvozdovic, Deloris Ping 06/26/2014, Pager  [phone]    ________________________________________________________________________  Velora Heckler GI MD note:  I personally examined the patient, reviewed the data and agree with the assessment and plan described above.  Brother was diagnosed with sprue (with genetic testing) and her mother feels [facility] better if she avoids gluten.  I think I prefer to proceed directly to EGD rather than further lab testing to check for path evidence of Celiac Sprue.  Have also started her on cholestyramine empirically.  Will plan for EGD tomorrow.   Owens Loffler, MD [facility] Pager [phone]

## 2014-06-27 NOTE — Transfer of Care (Signed)
Immediate Anesthesia Transfer of Care Note  Patient: [name]  Procedure(s) Performed: Procedure(s): ESOPHAGOGASTRODUODENOSCOPY (EGD) WITH PROPOFOL (N/A)  Patient Location: PACU  Anesthesia Type:MAC  Level of Consciousness:  sedated, patient cooperative and responds to stimulation  Airway & Oxygen Therapy:Patient Spontanous Breathing and Patient connected to face mask oxgen  Post-op Assessment:  Report given to PACU RN and Post -op Vital signs reviewed and stable  Post vital signs:  Reviewed and stable  Last Vitals:  Filed Vitals:   06/27/14 0751  BP: 98/68  Pulse: 100  Temp: 37 C  Resp: 16    Complications: No apparent anesthesia complications

## 2014-06-27 NOTE — Progress Notes (Addendum)
[facility] PROGRESS NOTE  [name]:[id number] DOB: Dec 29, 1980 DOA: 06/19/2014 PCP: [name], MD  Brief narrative 34 year old female with history of uncontrolled type 1 diabetes mellitus, recent hospitalization for DKA thought to be secondary to C. difficile returned to the ED with 24-hour history of nausea, vomiting, abdominal pain with diarrhea. CT of the abdomen and pelvis showed a resolved inflammatory changes around sigmoid colon and rectum with some residual thickening throughout distal sigmoid colon and rectum suggestive residual colitis. Patient seen by GI and underwent flexible sigmoidoscopy on 5/2 for persistent diarrhea which was negative for pseudomembranous colitis. Biopsies negative.   Assessment/Plan: Diarrhea with abdominal pain Etiology unclear. 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 C. difficile negative. GI started patient on Imodium. Diarrhea improved but abdominal pain persists. serology for celiac sprue negative Continue dicyclomine and amitriptyline. Added cholestyramine.  Continue when necessary Percocet and dilaudid for pain. EGD done  to evaluate for sprue. (History of brother diagnosed with sprue). bx pending.    Uncontrolled type 1 diabetes mellitus with ? gastroparesis Last A1c of 11.8. Sees Dr. Cruzita Lederer. Uncontrolled with highly fluctuating blood glucose. No further hypoglycemic episode in past 24 hrs. continue current dose of lantus and SSI. D/w with Dr Ardis Hughs on utility of GES. Suggests pt likely has severe gastroparesis given her uncontrolled DM. Since pt is on narcotics GES may yield false result.   Protein calorie malnutrition Added supplement.  Diet: Diabetic  DVT prophylaxis: Subcutaneous Lovenox   Consults: [facility]  Procedure:  Flexible sigmoidoscopy on 5/2 CT abdomen and pelvis EGD on 5/5  Code Status: Full code Family Communication: Mother at  bedside Disposition Plan: possibly on 5/7      Antibiotics:  Flagyl( stopped)  HPI/Subjective: abd pain persist. No N/V . Had 1 episode of diarrhea this afternoon. No hypoglycemic episodes today    Objective: Filed Vitals:   06/27/14 0826  BP: 97/63  Pulse:   Temp: 98.2 F (36.8 C)  Resp: 10    Intake/Output Summary (Last 24 hours) at 06/27/14 1406 Last data filed at 06/27/14 1305  Gross per 24 hour  Intake 1908.5 ml  Output   1000 ml  Net  908.5 ml   Filed Weights   06/20/14 0015 06/25/14 0434 06/27/14 0751  Weight: 44 kg (97 lb) 40.7 kg (89 lb 11.6 oz) 43.092 kg (95 lb)    Exam:   General:  no acute distress  HEENT: moist oral mucosa, supple neck  Chest: Clear to auscultation bilaterally,   CVS: Normal S1 and S2, no murmurs or gallop  GI: Soft, nondistended,  diffuse tenderness, bowel  sounds present  Musculoskeletal: Warm,   CNS: Alert and oriented    Data Reviewed: Basic Metabolic Panel:  Recent Labs Lab 06/21/14 0415 06/22/14 0428 06/26/14 0508  NA 136 139 131*  K 4.1 3.9 4.8  CL 105 104 97*  CO2 24 27 24   GLUCOSE 326* 196* 407*  BUN 6 5* 22*  CREATININE 0.51 0.48* 0.66  CALCIUM 8.5 8.6 9.0   Liver Function Tests:  Recent Labs Lab 06/21/14 0415 06/22/14 0428 06/26/14 0508  AST 26 27 47*  ALT 30 31 48  ALKPHOS 55 49 57  BILITOT 0.4 0.3 0.5  PROT 5.8* 5.5* 7.0  ALBUMIN 3.4* 3.1* 4.1   No results for input(s): LIPASE, AMYLASE in the last 168 hours. No results for input(s): AMMONIA in the last 168 hours. CBC:  Recent Labs Lab 06/21/14 0415 06/22/14 0428  WBC 5.3 4.5  HGB 11.7* 10.7*  HCT 34.0* 32.3*  MCV 90.2 90.7  PLT 232 221   Cardiac Enzymes: No results for input(s): CKTOTAL, CKMB, CKMBINDEX, TROPONINI in the last 168 hours. BNP (last 3 results) No results for input(s): BNP in the last 8760 hours.  ProBNP (last 3 results) No results for input(s): PROBNP in the last 8760 hours.  CBG:  Recent Labs Lab  06/26/14 1708 06/26/14 2218 06/27/14 0602 06/27/14 0911 06/27/14 1235  GLUCAP 188* 369* 146* 83 233*    Recent Results (from the past 240 hour(s))  Clostridium Difficile by PCR     Status: None   Collection Time: 06/20/14  3:40 AM  Result Value Ref Range Status   C difficile by pcr NEGATIVE NEGATIVE Final  Stool culture     Status: None   Collection Time: 06/20/14  3:40 AM  Result Value Ref Range Status   Specimen Description STOOL  Final   Special Requests NONE  Final   Culture   Final    NO SALMONELLA, SHIGELLA, CAMPYLOBACTER, YERSINIA, OR E.COLI 0157:H7 ISOLATED Performed at [facility]    Report Status 06/24/2014 FINAL  Final     Studies: No results found.  Scheduled Meds: . acidophilus  1 capsule Oral Daily  . amitriptyline  100 mg Oral QHS  . colestipol  1 g Oral Q12H  . cyanocobalamin  500 mcg Oral BID  . dicyclomine  10 mg Oral TID AC & HS  . feeding supplement (GLUCERNA SHAKE)  237 mL Oral TID BM  . gabapentin  300 mg Oral QHS  . insulin aspart  0-15 Units Subcutaneous TID WC  . insulin glargine  14 Units Subcutaneous q morning - 10a  . ketorolac  1 drop Left Eye QID  . loperamide  4 mg Oral BID  . multivitamin with minerals  1 tablet Oral q morning - 10a  . ofloxacin  1 drop Left Eye QID  . prednisoLONE acetate  1 drop Left Eye QID   Continuous Infusions: . dextrose 5 % and 0.45% NaCl 50 mL/hr at 06/26/14 2040      Time spent: [place], [place]  [facility] Pager [phone] If 7PM-7AM, please contact night-coverage at [email or web address], password [facility] 06/27/2014, 2:06 PM  LOS: 8 days

## 2014-06-28 ENCOUNTER — Encounter (HOSPITAL_COMMUNITY): Payer: Self-pay | Admitting: Gastroenterology

## 2014-06-28 LAB — IRON AND TIBC
IRON: 64 ug/dL (ref 28–170)
Saturation Ratios: 19 % (ref 10.4–31.8)
TIBC: 340 ug/dL (ref 250–450)
UIBC: 276 ug/dL

## 2014-06-28 LAB — GLUCOSE, CAPILLARY
GLUCOSE-CAPILLARY: 271 mg/dL — AB (ref 70–99)
GLUCOSE-CAPILLARY: 317 mg/dL — AB (ref 70–99)
Glucose-Capillary: 494 mg/dL — ABNORMAL HIGH (ref 70–99)
Glucose-Capillary: 326 mg/dL — ABNORMAL HIGH (ref 70–99)
Glucose-Capillary: 313 mg/dL — ABNORMAL HIGH (ref 70–99)

## 2014-06-28 LAB — BASIC METABOLIC PANEL
Anion gap: 4 — ABNORMAL LOW (ref 5–15)
BUN: 19 mg/dL (ref 6–20)
CHLORIDE: 103 mmol/L (ref 101–111)
CO2: 30 mmol/L (ref 22–32)
CREATININE: 0.69 mg/dL (ref 0.44–1.00)
Calcium: 8.9 mg/dL (ref 8.9–10.3)
GFR calc Af Amer: >60 mL/min (ref >60)
GFR calc non Af Amer: >60 mL/min (ref >60)
Glucose, Bld: 312 mg/dL — ABNORMAL HIGH (ref 70–99)
Potassium: 3.9 mmol/L (ref 3.5–5.1)
Sodium: 137 mmol/L (ref 135–145)

## 2014-06-28 LAB — VITAMIN B12: Vitamin B-12: 1404 pg/mL — ABNORMAL HIGH (ref 180–914)

## 2014-06-28 LAB — TSH: TSH: 2.748 u[IU]/mL (ref 0.350–4.500)

## 2014-06-28 MED ORDER — RIFAXIMIN 550 MG PO TABS
550.0000 mg | ORAL_TABLET | Freq: Three times a day (TID) | ORAL | Status: DC
  Administered 2014-06-28 – 2014-07-06 (×26): 550 mg via ORAL
  Filled 2014-06-28 (×27): qty 1

## 2014-06-28 MED ORDER — SACCHAROMYCES BOULARDII 250 MG PO CAPS
250.0000 mg | ORAL_CAPSULE | Freq: Two times a day (BID) | ORAL | Status: DC
  Administered 2014-06-28 – 2014-07-06 (×17): 250 mg via ORAL
  Filled 2014-06-28 (×18): qty 1

## 2014-06-28 MED ORDER — PANCRELIPASE (LIP-PROT-AMYL) 12000-38000 UNITS PO CPEP
24000.0000 [IU] | ORAL_CAPSULE | Freq: Three times a day (TID) | ORAL | Status: DC
  Administered 2014-06-28 – 2014-07-06 (×25): 24000 [IU] via ORAL
  Filled 2014-06-28 (×27): qty 2

## 2014-06-28 MED ORDER — LOPERAMIDE HCL 2 MG PO CAPS
4.0000 mg | ORAL_CAPSULE | Freq: Three times a day (TID) | ORAL | Status: DC
  Administered 2014-06-28 (×3): 4 mg via ORAL
  Filled 2014-06-28 (×4): qty 2

## 2014-06-28 MED ORDER — HYDROMORPHONE HCL 1 MG/ML IJ SOLN: 1.0000 mg | Freq: Once | INTRAMUSCULAR | Status: AC

## 2014-06-28 MED ORDER — HYDROMORPHONE HCL 2 MG/ML IJ SOLN
2.0000 mg | Freq: Once | INTRAMUSCULAR | Status: AC
Start: 2014-06-28 — End: 2014-06-28
  Administered 2014-06-28: 2 mg via INTRAVENOUS
  Filled 2014-06-28: qty 1

## 2014-06-28 MED ORDER — HYDROMORPHONE HCL 2 MG/ML IJ SOLN
2.0000 mg | Freq: Four times a day (QID) | INTRAMUSCULAR | Status: DC | PRN
  Administered 2014-06-28 – 2014-07-04 (×22): 2 mg via INTRAVENOUS
  Filled 2014-06-28 (×22): qty 1

## 2014-06-28 NOTE — Progress Notes (Addendum)
[facility] PROGRESS NOTE  [name] JOI:[id number] DOB: 1980-06-08 DOA: 06/19/2014 PCP: [name], MD  Brief narrative 34 year old female with history of uncontrolled type 1 diabetes mellitus, recent hospitalization for DKA thought to be secondary to C. difficile returned to the ED with 24-hour history of nausea, vomiting, abdominal pain with diarrhea. CT of the abdomen and pelvis showed a resolved inflammatory changes around sigmoid colon and rectum with some residual thickening throughout distal sigmoid colon and rectum suggestive residual colitis. Patient seen by GI and underwent flexible sigmoidoscopy on 5/2 for persistent diarrhea which was negative for pseudomembranous colitis. Biopsies negative.   Assessment/Plan: Diarrhea with abdominal pain Etiology unclear. Flexible sigmoidoscopy without pseudomembranous colitis C. difficile negative.  serology for celiac sprue negative. Differential includes IBS versus small bowel bacterial overgrowth. Continue dicyclomine and amitriptyline. Added cholestyramine. Increase dose of Imodium today. Added empiric rifaximin and Creon. Continue when necessary Percocet. Increase Dilaudid dose for better pain control. EGD done  to evaluate for sprue. (History of brother diagnosed with sprue). bx pending.    Uncontrolled type 1 diabetes mellitus with ? gastroparesis Last A1c of 11.8. Sees Dr. Cruzita Lederer. Uncontrolled with highly fluctuating blood glucose. No further hypoglycemic episode in past 24 hrs. continue current dose of lantus and SSI.    Protein calorie malnutrition Added supplement.  Anemia Check iron panel , TSH and B12.  Diet: Diabetic  DVT prophylaxis: Subcutaneous Lovenox   Consults: [facility]  Procedure:  Flexible sigmoidoscopy on 5/2 CT abdomen and pelvis EGD on 5/5  Code Status: Full code Family Communication: Mother at bedside Disposition Plan: Home possibly on 5/7 or 5/8 if clinically  stable.      Antibiotics:  Flagyl( stopped) Rifaximin since 5/6--  HPI/Subjective: Still has persistent abdominal pain. No nausea or vomiting. Has had 3-4 episodes of diarrhea overnight but reports it to be more formed and lesser quantity.   Objective: Filed Vitals:   06/28/14 1416  BP: 101/64  Pulse: 108  Temp: 98 F (36.7 C)  Resp: 16    Intake/Output Summary (Last 24 hours) at 06/28/14 1436 Last data filed at 06/28/14 1000  Gross per 24 hour  Intake    660 ml  Output   1350 ml  Net   -690 ml   Filed Weights   06/20/14 0015 06/25/14 0434 06/27/14 0751  Weight: 44 kg (97 lb) 40.7 kg (89 lb 11.6 oz) 43.092 kg (95 lb)    Exam:   General:  Young female in no acute distress  HEENT: moist oral mucosa, supple neck  Chest: Clear to auscultation bilaterally,   CVS: Normal S1 and S2, no murmurs or gallop  GI: Soft, nondistended,  diffuse tenderness, bowel  sounds present  Musculoskeletal: Warm,      Data Reviewed: Basic Metabolic Panel:  Recent Labs Lab 06/22/14 0428 06/26/14 0508  NA 139 131*  K 3.9 4.8  CL 104 97*  CO2 27 24  GLUCOSE 196* 407*  BUN 5* 22*  CREATININE 0.48* 0.66  CALCIUM 8.6 9.0   Liver Function Tests:  Recent Labs Lab 06/22/14 0428 06/26/14 0508  AST 27 47*  ALT 31 48  ALKPHOS 49 57  BILITOT 0.3 0.5  PROT 5.5* 7.0  ALBUMIN 3.1* 4.1   No results for input(s): LIPASE, AMYLASE in the last 168 hours. No results for input(s): AMMONIA in the last 168 hours. CBC:  Recent Labs Lab 06/22/14 0428  WBC 4.5  HGB 10.7*  HCT 32.3*  MCV 90.7  PLT 221  Cardiac Enzymes: No results for input(s): CKTOTAL, CKMB, CKMBINDEX, TROPONINI in the last 168 hours. BNP (last 3 results) No results for input(s): BNP in the last 8760 hours.  ProBNP (last 3 results) No results for input(s): PROBNP in the last 8760 hours.  CBG:  Recent Labs Lab 06/27/14 1806 06/27/14 2208 06/28/14 0733 06/28/14 1008 06/28/14 1138  GLUCAP 260*  192* 494* 326* 271*    Recent Results (from the past 240 hour(s))  Clostridium Difficile by PCR     Status: None   Collection Time: 06/20/14  3:40 AM  Result Value Ref Range Status   C difficile by pcr NEGATIVE NEGATIVE Final  Stool culture     Status: None   Collection Time: 06/20/14  3:40 AM  Result Value Ref Range Status   Specimen Description STOOL  Final   Special Requests NONE  Final   Culture   Final    NO SALMONELLA, SHIGELLA, CAMPYLOBACTER, YERSINIA, OR E.COLI 0157:H7 ISOLATED Performed at [facility]    Report Status 06/24/2014 FINAL  Final     Studies: No results found.  Scheduled Meds: . amitriptyline  100 mg Oral QHS  . colestipol  1 g Oral Q12H  . cyanocobalamin  500 mcg Oral BID  . dicyclomine  10 mg Oral TID AC & HS  . feeding supplement (GLUCERNA SHAKE)  237 mL Oral TID BM  . gabapentin  300 mg Oral QHS  . insulin aspart  0-15 Units Subcutaneous TID WC  . insulin glargine  14 Units Subcutaneous q morning - 10a  . ketorolac  1 drop Left Eye QID  . lipase/protease/amylase  24,000 Units Oral TID AC  . loperamide  4 mg Oral TID  . multivitamin with minerals  1 tablet Oral q morning - 10a  . ofloxacin  1 drop Left Eye QID  . prednisoLONE acetate  1 drop Left Eye QID  . rifaximin  550 mg Oral TID  . saccharomyces boulardii  250 mg Oral BID   Continuous Infusions:      Time spent: [place], [place]  [facility] Pager [phone] If 7PM-7AM, please contact night-coverage at [email or web address], password [facility] 06/28/2014, 2:36 PM  LOS: 9 days

## 2014-06-28 NOTE — Progress Notes (Signed)
     [facility] Progress Note  Subjective:   S/P EGD yesterday, biopsies pending. Still with diarrhea, less volume,still watery.  Glucose this morning 407.   Objective:  Vital signs in last 24 hours: Temp:  [98 F (36.7 C)-98.6 F (37 C)] 98 F (36.7 C) (05/06 0556) Pulse Rate:  [101-104] 101 (05/06 0556) Resp:  [14-16] 16 (05/06 0556) BP: (106-110)/(65-72) 107/65 mmHg (05/06 0556) SpO2:  [98 %-100 %] 98 % (05/06 0556) Last BM Date: 06/27/14 General:   Alert,  Well-developed,    in NAD Heart:  Regular rate and rhythm; no murmurs Pulm;lungs clear Abdomen:  Soft, nontender and nondistended. Normal bowel sounds, without guarding, and without rebound.   Extremities:  Without edema. Neurologic:  Alert and  oriented x4;  grossly normal neurologically. Psych:  Alert and cooperative. Normal mood and affect.  Intake/Output from previous day: 05/05 0701 - 05/06 0700 In: 1848 [P.O.:1200; I.V.:648] Out: 1750 [Stool:1750] Intake/Output this shift:      BMET  Recent Labs  06/26/14 0508  NA 131*  K 4.8  CL 97*  CO2 24  GLUCOSE 407*  BUN 22*  CREATININE 0.66  CALCIUM 9.0   LFT  Recent Labs  06/26/14 0508  PROT 7.0  ALBUMIN 4.1  AST 47*  ALT 48  ALKPHOS 57  BILITOT 0.5  BILIDIR 0.1  IBILI 0.4   PROCEDURES: EGD 06/27/14:ENDOSCOPIC IMPRESSION: There was moderate amount of residual liquid gastric contents without anatomic outlet obstruction. The mucosa throughout the UGI tract appeared normal. The duodenum was biopsied and sent to pathology RECOMMENDATIONS: Continue scheduled imodium, scheduled cholestyramine. Await duodenal biopsies, hopefully back tomorrow.  Colonoscopy 06/24/14:ENDOSCOPIC IMPRESSION: There was yellowish flecks of exudate, stool throughout the colon but NONE was adherent or suggestive of pseudomembranes and the underlying mucosa appeared normal. Given lack of clear diagnosis, I proceeded to evaluate the right colon and terminal ileum,  both of which were normal. The colon was randomly biopsied RECOMMENDATIONS: She does not have psuedomembranous colitis. 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 Will start imoduim 1-2 pills twice daily for now. Will also check for serologic evidence of celiac sprue with bloodwork today. Await final pathology. Continue IV hydration. will stop flagyl.  ASSESSMENT/PLAN:  34 yo diabetic female with diarrhea. ? Post infectious IBS. Colonoscopy without microscopic colitis. Celiac labs neg, Small bowel biopsies pending. On colestid and imodium. Will emperically try addition of pancreatic enzyme (creon). EGD showed retained fluid suggestive of gastroparesis. ? Diabetic enteropathy? If fails to improve and biopsies negative, may consider trial of octreotide ( though can cause hypoglycemia, and can aggravate steatorrhea.). Will review with Dr Ardis Hughs pending biopsy results. Clonidine has also been used in diabetic diarrhea (though it can cause orthostatic hypotension and delayed gastric emptying). Will also give trial of xifaxan for possible SIBO.     LOS: 9 days   Hvozdovic, Vita Barley PA-C 35/07/2014, Pager [phone]   ________________________________________________________________________  Velora Heckler GI MD note:  I personally examined the patient, reviewed the data and agree with the assessment and plan described above.   Will increase imodium to TID, also adding creon and xifaxan (all empirically). Still awaiting path from yesterday's EGD.   Owens Loffler, MD [facility] Pager [phone]

## 2014-06-29 DIAGNOSIS — K3184 Gastroparesis: Secondary | ICD-10-CM

## 2014-06-29 DIAGNOSIS — E1043 Type 1 diabetes mellitus with diabetic autonomic (poly)neuropathy: Principal | ICD-10-CM

## 2014-06-29 LAB — CBC
HCT: 32 % — ABNORMAL LOW (ref 36.0–46.0)
Hemoglobin: 10.7 g/dL — ABNORMAL LOW (ref 12.0–15.0)
MCH: 31.3 pg (ref 26.0–34.0)
MCHC: 33.4 g/dL (ref 30.0–36.0)
MCV: 93.6 fL (ref 78.0–100.0)
Platelets: 216 10*3/uL (ref 150–400)
RBC: 3.42 MIL/uL — ABNORMAL LOW (ref 3.87–5.11)
RDW: 12.8 % (ref 11.5–15.5)
WBC: 6.1 10*3/uL (ref 4.0–10.5)

## 2014-06-29 LAB — GLUCOSE, CAPILLARY
Glucose-Capillary: 486 mg/dL — ABNORMAL HIGH (ref 70–99)
Glucose-Capillary: 233 mg/dL — ABNORMAL HIGH (ref 70–99)
Glucose-Capillary: 138 mg/dL — ABNORMAL HIGH (ref 70–99)
Glucose-Capillary: 138 mg/dL — ABNORMAL HIGH (ref 70–99)

## 2014-06-29 LAB — GLUCOSE, RANDOM: GLUCOSE: 562 mg/dL — AB (ref 70–99)

## 2014-06-29 MED ORDER — INSULIN ASPART 100 UNIT/ML ~~LOC~~ SOLN
0.0000 [IU] | Freq: Three times a day (TID) | SUBCUTANEOUS | Status: DC
  Administered 2014-06-29: 2 [IU] via SUBCUTANEOUS
  Administered 2014-06-30: 8 [IU] via SUBCUTANEOUS
  Administered 2014-06-30: 4 [IU] via SUBCUTANEOUS
  Administered 2014-06-30: 8 [IU] via SUBCUTANEOUS
  Administered 2014-07-01: 5 [IU] via SUBCUTANEOUS
  Administered 2014-07-01 (×2): 2 [IU] via SUBCUTANEOUS
  Administered 2014-07-02: 8 [IU] via SUBCUTANEOUS
  Administered 2014-07-02: 5 [IU] via SUBCUTANEOUS
  Administered 2014-07-02: 10 [IU] via SUBCUTANEOUS
  Administered 2014-07-03: 4 [IU] via SUBCUTANEOUS
  Administered 2014-07-03: 2 [IU] via SUBCUTANEOUS
  Administered 2014-07-03: 5 [IU] via SUBCUTANEOUS
  Administered 2014-07-04: 4 [IU] via SUBCUTANEOUS
  Administered 2014-07-04: 3 [IU] via SUBCUTANEOUS
  Administered 2014-07-04: 6 [IU] via SUBCUTANEOUS
  Administered 2014-07-05: 5 [IU] via SUBCUTANEOUS
  Administered 2014-07-05: 2 [IU] via SUBCUTANEOUS
  Administered 2014-07-06: 3 [IU] via SUBCUTANEOUS
  Administered 2014-07-06: 7 [IU] via SUBCUTANEOUS

## 2014-06-29 MED ORDER — INSULIN ASPART 100 UNIT/ML ~~LOC~~ SOLN
0.0000 [IU] | Freq: Every day | SUBCUTANEOUS | Status: DC
  Administered 2014-07-03: 1 [IU] via SUBCUTANEOUS
  Administered 2014-07-04 – 2014-07-05 (×2): 2 [IU] via SUBCUTANEOUS

## 2014-06-29 MED ORDER — INSULIN GLARGINE 100 UNIT/ML ~~LOC~~ SOLN
20.0000 [IU] | Freq: Every morning | SUBCUTANEOUS | Status: DC
  Administered 2014-06-30 – 2014-07-02 (×3): 20 [IU] via SUBCUTANEOUS
  Administered 2014-07-03: 15 [IU] via SUBCUTANEOUS
  Filled 2014-06-29 (×5): qty 0.2

## 2014-06-29 MED ORDER — LOPERAMIDE HCL 2 MG PO CAPS
4.0000 mg | ORAL_CAPSULE | Freq: Three times a day (TID) | ORAL | Status: DC
  Administered 2014-06-29 – 2014-07-06 (×28): 4 mg via ORAL
  Filled 2014-06-29 (×38): qty 2

## 2014-06-29 NOTE — Progress Notes (Addendum)
[facility] Progress Note    Since last GI note: Started xifaxin and pancreatic enzymes, increased imodium to tid, continued cholestyramine.  Bxs from duodenum do NOT show celiac sprue.  Still with frequent loose stools but she thinks they are probably getting somewhat solid.  Objective: Vital signs in last 24 hours: Temp:  [98 F (36.7 C)-98.8 F (37.1 C)] 98.8 F (37.1 C) (05/07 0514) Pulse Rate:  [106-108] 107 (05/07 0514) Resp:  [16] 16 (05/07 0514) BP: (101-104)/(64-68) 103/65 mmHg (05/07 0514) SpO2:  [97 %-98 %] 97 % (05/07 0514) Last BM Date: 06/28/14 General: alert and oriented times 3 Heart: regular rate and rythm Abdomen: soft, non-tender, non-distended, normal bowel sounds   Lab Results:  Recent Labs  06/29/14 0503  WBC 6.1  HGB 10.7*  PLT 216  MCV 93.6    Recent Labs  06/28/14 1520  NA 137  K 3.9  CL 103  CO2 30  GLUCOSE 312*  BUN 19  CREATININE 0.69  CALCIUM 8.9    Medications: Scheduled Meds: . amitriptyline  100 mg Oral QHS  . colestipol  1 g Oral Q12H  . cyanocobalamin  500 mcg Oral BID  . dicyclomine  10 mg Oral TID AC & HS  . feeding supplement (GLUCERNA SHAKE)  237 mL Oral TID BM  . gabapentin  300 mg Oral QHS  . insulin aspart  0-15 Units Subcutaneous TID WC  . insulin glargine  14 Units Subcutaneous q morning - 10a  . ketorolac  1 drop Left Eye QID  . lipase/protease/amylase  24,000 Units Oral TID AC  . loperamide  4 mg Oral TID  . multivitamin with minerals  1 tablet Oral q morning - 10a  . ofloxacin  1 drop Left Eye QID  . prednisoLONE acetate  1 drop Left Eye QID  . rifaximin  550 mg Oral TID  . saccharomyces boulardii  250 mg Oral BID   Continuous Infusions:  PRN Meds:.acetaminophen **OR** acetaminophen, HYDROmorphone (DILAUDID) injection, ondansetron **OR** ondansetron (ZOFRAN) IV, oxyCODONE-acetaminophen, promethazine    Assessment/Plan: 34 y.o. female with chronic diarrhea, uncontrolled DM type 1  She  does not have sprue, she does not have C. Difficile (was positive a month ago), GI pathogen panel was negative, she does not have microscopic colitis on biopsies, endoscopic exam of her colon and terminal ileum were normal this week (so she does not have crohn's or ulcerative colitis).  This is very possibly DM related and so yesterday I started her on SIBO antibiotics (xifaxin) as well as pancreatic enzymes for possible pancreatic insufficiency.  Today will have laxative abuse panel sent, stool electrolytes and 24 hour stool collection for stool osmolality, and I will maximize imodium (2 pills QID).    Milus Banister, MD  06/29/2014, 7:08 AM [facility] Pager [phone]

## 2014-06-29 NOTE — Progress Notes (Signed)
[facility] PROGRESS NOTE  [name] OEV:[id number] DOB: 02-Aug-1980 DOA: 06/19/2014 PCP: [name], MD  Brief narrative 34 year old female with history of uncontrolled type 1 diabetes mellitus, recent hospitalization for DKA thought to be secondary to C. difficile returned to the ED with 24-hour history of nausea, vomiting, abdominal pain with diarrhea. CT of the abdomen and pelvis showed a resolved inflammatory changes around sigmoid colon and rectum with some residual thickening throughout distal sigmoid colon and rectum suggestive residual colitis. Patient seen by GI and underwent flexible sigmoidoscopy on 5/2 for persistent diarrhea which was negative for pseudomembranous colitis. Biopsies negative.   Assessment/Plan: Diarrhea with abdominal pain Etiology unclear. Flexible sigmoidoscopy without pseudomembranous colitis C. difficile negative.  serology for celiac sprue negative. Differential includes severe gastroparesis versus small bowel bacterial overgrowth. Continue dicyclomine and amitriptyline. Added cholestyramine. Increase dose of Imodium further to 2 tablets 4 times a day.  Added empiric rifaximin and Creon. Continue when necessary Percocet. Increased Dilaudid dose for better pain control. EGD done  with biopsy negative for sprue. -Stool electrolytes and 24-hour stool collection for stools molality ordered. Laxative abuse panel sent.    Uncontrolled type 1 diabetes mellitus with ? gastroparesis Last A1c of 11.8. Sees Dr. Cruzita Lederer. Uncontrolled with highly fluctuating blood glucose with episodes of hypo-and hyperglycemia. FSG is elevated to 400s since overnight. Will increase her Lantus to 20 units daily and continue moderate sliding scale insulin. Added bedtime coverage.    Protein calorie malnutrition Added supplement.  Anemia Possibly of chronic disease. Iron panel to 6 and B12 normal.  Diet: Diabetic  DVT prophylaxis: Subcutaneous Lovenox   Consults:  [facility] GI  Procedure:  Flexible sigmoidoscopy on 5/2 CT abdomen and pelvis EGD on 5/5  Code Status: Full code Family Communication: None at bedside Disposition Plan: Home possibly on 5/9 if pain improved.      Antibiotics:  Flagyl( stopped) Rifaximin since 5/6--  HPI/Subjective: Abdominal pain persist but slightly better. Bowel frequency is less and having more formed stool. Fingersticks elevated today.   Objective: Filed Vitals:   06/29/14 0514  BP: 103/65  Pulse: 107  Temp: 98.8 F (37.1 C)  Resp: 16    Intake/Output Summary (Last 24 hours) at 06/29/14 1420 Last data filed at 06/29/14 1229  Gross per 24 hour  Intake   2120 ml  Output      3 ml  Net   2117 ml   Filed Weights   06/20/14 0015 06/25/14 0434 06/27/14 0751  Weight: 44 kg (97 lb) 40.7 kg (89 lb 11.6 oz) 43.092 kg (95 lb)    Exam:   General:  Young female in no acute distress  HEENT: moist oral mucosa, supple neck  Chest: Clear to auscultation bilaterally,   CVS: Normal S1 and S2, no murmurs or gallop  GI: Soft, nondistended,  diffuse tenderness (less compared to yesterday), bowel  sounds present  Musculoskeletal: Warm,      Data Reviewed: Basic Metabolic Panel:  Recent Labs Lab 06/26/14 0508 06/28/14 1520 06/29/14 0745  NA 131* 137  --   K 4.8 3.9  --   CL 97* 103  --   CO2 24 30  --   GLUCOSE 407* 312* 562*  BUN 22* 19  --   CREATININE 0.66 0.69  --   CALCIUM 9.0 8.9  --    Liver Function Tests:  Recent Labs Lab 06/26/14 0508  AST 47*  ALT 48  ALKPHOS 57  BILITOT 0.5  PROT 7.0  ALBUMIN 4.1  No results for input(s): LIPASE, AMYLASE in the last 168 hours. No results for input(s): AMMONIA in the last 168 hours. CBC:  Recent Labs Lab 06/29/14 0503  WBC 6.1  HGB 10.7*  HCT 32.0*  MCV 93.6  PLT 216   Cardiac Enzymes: No results for input(s): CKTOTAL, CKMB, CKMBINDEX, TROPONINI in the last 168 hours. BNP (last 3 results) No results for input(s): BNP  in the last 8760 hours.  ProBNP (last 3 results) No results for input(s): PROBNP in the last 8760 hours.  CBG:  Recent Labs Lab 06/28/14 1138 06/28/14 1700 06/28/14 2201 06/29/14 0718 06/29/14 1217  GLUCAP 271* 313* 317* 486* 233*    Recent Results (from the past 240 hour(s))  Clostridium Difficile by PCR     Status: None   Collection Time: 06/20/14  3:40 AM  Result Value Ref Range Status   C difficile by pcr NEGATIVE NEGATIVE Final  Stool culture     Status: None   Collection Time: 06/20/14  3:40 AM  Result Value Ref Range Status   Specimen Description STOOL  Final   Special Requests NONE  Final   Culture   Final    NO SALMONELLA, SHIGELLA, CAMPYLOBACTER, YERSINIA, OR E.COLI 0157:H7 ISOLATED Performed at [facility]    Report Status 06/24/2014 FINAL  Final     Studies: No results found.  Scheduled Meds: . amitriptyline  100 mg Oral QHS  . colestipol  1 g Oral Q12H  . cyanocobalamin  500 mcg Oral BID  . dicyclomine  10 mg Oral TID AC & HS  . feeding supplement (GLUCERNA SHAKE)  237 mL Oral TID BM  . gabapentin  300 mg Oral QHS  . insulin aspart  0-15 Units Subcutaneous TID WC  . insulin glargine  14 Units Subcutaneous q morning - 10a  . ketorolac  1 drop Left Eye QID  . lipase/protease/amylase  24,000 Units Oral TID AC  . loperamide  4 mg Oral TID AC & HS  . multivitamin with minerals  1 tablet Oral q morning - 10a  . ofloxacin  1 drop Left Eye QID  . prednisoLONE acetate  1 drop Left Eye QID  . rifaximin  550 mg Oral TID  . saccharomyces boulardii  250 mg Oral BID   Continuous Infusions:      Time spent: [place], [place]  [facility] Pager [phone] If 7PM-7AM, please contact night-coverage at [email or web address], password [facility] 06/29/2014, 2:20 PM  LOS: 10 days

## 2014-06-29 NOTE — Progress Notes (Signed)
CBG = 486. Stat blood glucose ordered & Dr Clementeen Graham notified via text. He gave order via phone to give 15 units novolog per SSI & give lantus now. Manraj Yeo, [employer]

## 2014-06-29 NOTE — Progress Notes (Signed)
Pt reports 2 more BMs, more liquid than previous, dark green & with 'sand-like' material. Quintella Mura, [employer]

## 2014-06-29 NOTE — Progress Notes (Addendum)
Notified by lab STAT blood glucose 562. Message passed on to Dr Candiss Norse. Pt had soft, brown BM.  Kathleene Bergemann, [employer]

## 2014-06-29 NOTE — Progress Notes (Signed)
Inpatient Diabetes Program Recommendations  AACE/ADA: New Consensus Statement on Inpatient Glycemic Control (2013)  Target Ranges:  Prepandial:   less than 140 mg/dL      Peak postprandial:   less than 180 mg/dL (1-2 hours)      Critically ill patients:  140 - 180 mg/dL    Inpatient Diabetes Program Recommendations Insulin - Basal: Please increase lantus to 20 units  Correction (SSI): Add HS correction Insulin - Meal Coverage: [id number] HgbA1C: Pending Diet: Pt on Regular diet-glucose levels into 400's-changed to cho mod with cosign required  Thank you Rosita Kea, RN, MSN, CDE  Diabetes Inpatient Program Office: [phone] Pager: [phone]

## 2014-06-30 DIAGNOSIS — E1143 Type 2 diabetes mellitus with diabetic autonomic (poly)neuropathy: Secondary | ICD-10-CM

## 2014-06-30 LAB — GLUCOSE, CAPILLARY
GLUCOSE-CAPILLARY: 259 mg/dL — AB (ref 70–99)
GLUCOSE-CAPILLARY: 216 mg/dL — AB (ref 70–99)
Glucose-Capillary: 304 mg/dL — ABNORMAL HIGH (ref 70–99)
Glucose-Capillary: 274 mg/dL — ABNORMAL HIGH (ref 70–99)
Glucose-Capillary: 60 mg/dL — ABNORMAL LOW (ref 70–99)
Glucose-Capillary: 106 mg/dL — ABNORMAL HIGH (ref 70–99)
Glucose-Capillary: 210 mg/dL — ABNORMAL HIGH (ref 70–99)

## 2014-06-30 MED ORDER — DIPHENHYDRAMINE-ZINC ACETATE 2-0.1 % EX CREA
TOPICAL_CREAM | Freq: Two times a day (BID) | CUTANEOUS | Status: DC | PRN
  Administered 2014-06-30: 23:00:00 via TOPICAL
  Filled 2014-06-30: qty 28

## 2014-06-30 MED ORDER — DIPHENOXYLATE-ATROPINE 2.5-0.025 MG PO TABS
2.0000 | ORAL_TABLET | Freq: Two times a day (BID) | ORAL | Status: DC
  Administered 2014-06-30 – 2014-07-06 (×13): 2 via ORAL
  Filled 2014-06-30 (×12): qty 2

## 2014-06-30 MED ORDER — INSULIN ASPART 100 UNIT/ML ~~LOC~~ SOLN: 3.0000 [IU] | Freq: Three times a day (TID) | SUBCUTANEOUS | Status: DC

## 2014-06-30 MED ORDER — ENOXAPARIN SODIUM 30 MG/0.3ML ~~LOC~~ SOLN
30.0000 mg | SUBCUTANEOUS | Status: DC
  Administered 2014-06-30 – 2014-07-06 (×7): 30 mg via SUBCUTANEOUS
  Filled 2014-06-30 (×7): qty 0.3

## 2014-06-30 NOTE — Progress Notes (Signed)
[facility] Progress Note    Since last GI note: Still with significant diarrhea, nonbloody.  Large incontinent stool this morning.  She says definitely improved since admission but still 10-15 loose stools per day (was 30-40 per day at admit)  Objective: Vital signs in last 24 hours: Temp:  [98 F (36.7 C)-98.9 F (37.2 C)] 98.5 F (36.9 C) (05/08 0522) Pulse Rate:  [98-117] 98 (05/08 0522) Resp:  [15-16] 15 (05/08 0522) BP: (99-107)/(67-75) 107/67 mmHg (05/08 0522) SpO2:  [98 %-100 %] 100 % (05/08 0522) Last BM Date: 06/29/14 General: alert and oriented times 3 Heart: regular rate and rythm Abdomen: soft, non-tender, non-distended, normal bowel sounds   Lab Results:  Recent Labs  06/29/14 0503  WBC 6.1  HGB 10.7*  PLT 216  MCV 93.6    Recent Labs  06/28/14 1520 06/29/14 0745  NA 137  --   K 3.9  --   CL 103  --   CO2 30  --   GLUCOSE 312* 562*  BUN 19  --   CREATININE 0.69  --   CALCIUM 8.9  --     Medications: Scheduled Meds: . amitriptyline  100 mg Oral QHS  . colestipol  1 g Oral Q12H  . cyanocobalamin  500 mcg Oral BID  . dicyclomine  10 mg Oral TID AC & HS  . feeding supplement (GLUCERNA SHAKE)  237 mL Oral TID BM  . gabapentin  300 mg Oral QHS  . insulin aspart  0-15 Units Subcutaneous TID WC  . insulin aspart  0-5 Units Subcutaneous QHS  . insulin glargine  20 Units Subcutaneous q morning - 10a  . ketorolac  1 drop Left Eye QID  . lipase/protease/amylase  24,000 Units Oral TID AC  . loperamide  4 mg Oral TID AC & HS  . multivitamin with minerals  1 tablet Oral q morning - 10a  . ofloxacin  1 drop Left Eye QID  . prednisoLONE acetate  1 drop Left Eye QID  . rifaximin  550 mg Oral TID  . saccharomyces boulardii  250 mg Oral BID   Continuous Infusions:  PRN Meds:.acetaminophen **OR** acetaminophen, HYDROmorphone (DILAUDID) injection, ondansetron **OR** ondansetron (ZOFRAN) IV, oxyCODONE-acetaminophen,  promethazine    Assessment/Plan: 34 y.o. female with chronic diarrhea, uncontrolled DM  She does not have sprue, she does not have C. Difficile (was positive a month ago), GI pathogen panel was negative, she does not have microscopic colitis on biopsies, endoscopic exam of her colon and terminal ileum were normal earlier this week (so she does not have crohn's or ulcerative colitis). This is very possibly DM related; for 3 days she's been on SIBO antibiotics (xifaxin) as well as pancreatic enzymes for possible pancreatic insufficiency. Yesterday, laxative stool testing sent, stool electrolytes and 24 hour stool collection for stool osmolality, and I increased maximize imodium (2 pills QID).  Will add lomotil now as well (2 pills twice daily, scheduled).    Milus Banister, MD  06/30/2014, 6:31 AM [facility] Pager [phone]

## 2014-06-30 NOTE — Progress Notes (Signed)
[facility] PROGRESS NOTE  NAVNEET SCHMUCK KWI:[id number] DOB: 02-17-1981 DOA: 06/19/2014 PCP: [name], MD  Brief narrative 34 year old female with history of uncontrolled type 1 diabetes mellitus, recent hospitalization for DKA thought to be secondary to C. difficile returned to the ED with 24-hour history of nausea, vomiting, abdominal pain with diarrhea. CT of the abdomen and pelvis showed a resolved inflammatory changes around sigmoid colon and rectum with some residual thickening throughout distal sigmoid colon and rectum suggestive residual colitis. Patient seen by GI and underwent flexible sigmoidoscopy on 5/2 for persistent diarrhea which was negative for pseudomembranous colitis. Biopsies negative.   Assessment/Plan: Diarrhea with abdominal pain Flexible sigmoidoscopy without pseudomembranous colitis C. difficile negative.  serology for celiac sprue negative. Differential includes severe gastroparesis versus small bowel bacterial overgrowth. Continue dicyclomine and amitriptyline. Added cholestyramine. Increase dose of Imodium and added scheduled Lomotil.  Added empiric rifaximin and Creon. Continue when necessary Percocet and Dilaudid for pain control. EGD done  with biopsy negative for sprue. -Stool electrolytes and 24-hour stool collection for stools molality ordered. Laxative abuse panel sent. -Appreciated GI follow-up and recommendations.   Uncontrolled type 1 diabetes mellitus with ? gastroparesis Last A1c of 11.8. Sees Dr. Cruzita Lederer. Uncontrolled with highly fluctuating blood glucose with episodes of hypo-and hyperglycemia.  Lantus dose increased to 20 units at bedtime and added low-dose pre-meal aspart as fingersticks persistently elevated.   Protein calorie malnutrition Added supplement.  Anemia Possibly of chronic disease. Iron panel suggests anemia of chronic disease. TSH and B12 normal .  Diet: Diabetic  DVT prophylaxis: Subcutaneous  Lovenox   Consults: [facility]be[facility] GI  Procedure:  Flexible sigmoidoscopy on 5/2 CT abdomen and pelvis EGD on 5/5  Code Status: Full code Family Communication: None at bedside Disposition Plan: Home possibly 5/10 f diarrhea and abd pain improved.      Antibiotics:  Flagyl( stopped) Rifaximin since 5/6--  HPI/Subjective: Abdominal pain persists. Still having diarrhea of 10-12 episodes but reports it to be more formed like "baby food"  Objective: Filed Vitals:   06/30/14 0522  BP: 107/67  Pulse: 98  Temp: 98.5 F (36.9 C)  Resp: 15    Intake/Output Summary (Last 24 hours) at 06/30/14 1222 Last data filed at 06/30/14 1141  Gross per 24 hour  Intake   2260 ml  Output   1100 ml  Net   1160 ml   Filed Weights   06/20/14 0015 06/25/14 0434 06/27/14 0751  Weight: 44 kg (97 lb) 40.7 kg (89 lb 11.6 oz) 43.092 kg (95 lb)    Exam:   General:  Young female in no acute distress  HEENT: moist oral mucosa, supple neck  Chest: Clear to auscultation bilaterally,   CVS: Normal S1 and S2, no murmurs or gallop  GI: Soft, nondistended,  diffuse tenderness , bowel  sounds present  Musculoskeletal: Warm,      Data Reviewed: Basic Metabolic Panel:  Recent Labs Lab 06/26/14 0508 06/28/14 1520 06/29/14 0745  NA 131* 137  --   K 4.8 3.9  --   CL 97* 103  --   CO2 24 30  --   GLUCOSE 407* 312* 562*  BUN 22* 19  --   CREATININE 0.66 0.69  --   CALCIUM 9.0 8.9  --    Liver Function Tests:  Recent Labs Lab 06/26/14 0508  AST 47*  ALT 48  ALKPHOS 57  BILITOT 0.5  PROT 7.0  ALBUMIN 4.1   No results for input(s): LIPASE, AMYLASE in the  last 168 hours. No results for input(s): AMMONIA in the last 168 hours. CBC:  Recent Labs Lab 06/29/14 0503  WBC 6.1  HGB 10.7*  HCT 32.0*  MCV 93.6  PLT 216   Cardiac Enzymes: No results for input(s): CKTOTAL, CKMB, CKMBINDEX, TROPONINI in the last 168 hours. BNP (last 3 results) No results for input(s): BNP in the  last 8760 hours.  ProBNP (last 3 results) No results for input(s): PROBNP in the last 8760 hours.  CBG:  Recent Labs Lab 06/29/14 1217 06/29/14 1801 06/29/14 2052 06/30/14 0231 06/30/14 0814  GLUCAP 233* 138* 138* 304* 274*    No results found for this or any previous visit (from the past 240 hour(s)).   Studies: No results found.  Scheduled Meds: . amitriptyline  100 mg Oral QHS  . colestipol  1 g Oral Q12H  . cyanocobalamin  500 mcg Oral BID  . dicyclomine  10 mg Oral TID AC & HS  . diphenoxylate-atropine  2 tablet Oral BID  . feeding supplement (GLUCERNA SHAKE)  237 mL Oral TID BM  . gabapentin  300 mg Oral QHS  . insulin aspart  0-15 Units Subcutaneous TID WC  . insulin aspart  0-5 Units Subcutaneous QHS  . insulin aspart  3 Units Subcutaneous TID WC  . insulin glargine  20 Units Subcutaneous q morning - 10a  . ketorolac  1 drop Left Eye QID  . lipase/protease/amylase  24,000 Units Oral TID AC  . loperamide  4 mg Oral TID AC & HS  . multivitamin with minerals  1 tablet Oral q morning - 10a  . ofloxacin  1 drop Left Eye QID  . prednisoLONE acetate  1 drop Left Eye QID  . rifaximin  550 mg Oral TID  . saccharomyces boulardii  250 mg Oral BID   Continuous Infusions:      Time spent: [place], [place]  [facility] Pager [phone] If 7PM-7AM, please contact night-coverage at [email or web address], password [facility] 06/30/2014, 12:22 PM  LOS: 11 days

## 2014-06-30 NOTE — Progress Notes (Signed)
CRITICAL VALUE ALERT  Critical value received: cbg=60  Date of notification:  06/30/2014  Time of notification: 1430  Critical value read back:Yes.   yes  Nurse who received alert:  Sonda Rumble, RN, from Kellie Simmering, [place]  MD notified (1st page):  Dr Clementeen Graham  Time of first page:  [phone]  MD notified (2nd page): none  Time of second page: none  Responding MD:  Dr Clementeen Graham  Time MD responded:  [phone]

## 2014-06-30 NOTE — Progress Notes (Signed)
Dr Clementeen Graham said to hold meal coverage for now & that he would discuss with her endocrinologist in the morning. Cimone Fahey, [employer]

## 2014-07-01 LAB — BASIC METABOLIC PANEL
Anion gap: 10 (ref 5–15)
BUN: 17 mg/dL (ref 6–20)
CALCIUM: 10 mg/dL (ref 8.9–10.3)
CHLORIDE: 102 mmol/L (ref 101–111)
CO2: 28 mmol/L (ref 22–32)
Creatinine, Ser: 0.56 mg/dL (ref 0.44–1.00)
GFR calc Af Amer: >60 mL/min (ref >60)
GFR calc non Af Amer: >60 mL/min (ref >60)
Glucose, Bld: 218 mg/dL — ABNORMAL HIGH (ref 70–99)
Potassium: 4.1 mmol/L (ref 3.5–5.1)
SODIUM: 140 mmol/L (ref 135–145)

## 2014-07-01 LAB — GLUCOSE, CAPILLARY
GLUCOSE-CAPILLARY: 215 mg/dL — AB (ref 70–99)
GLUCOSE-CAPILLARY: 69 mg/dL — AB (ref 70–99)
Glucose-Capillary: 363 mg/dL — ABNORMAL HIGH (ref 70–99)
Glucose-Capillary: 217 mg/dL — ABNORMAL HIGH (ref 70–99)
Glucose-Capillary: 172 mg/dL — ABNORMAL HIGH (ref 70–99)
Glucose-Capillary: 163 mg/dL — ABNORMAL HIGH (ref 70–99)

## 2014-07-01 NOTE — Progress Notes (Signed)
[facility] PRO[facility]ESS [facility]  GENEVRA ORNE QPY:[id number] DOB: [birth year] DOA: 06/19/2014 PCP: [name], MD  Brief narrative 34 year old female with history of uncontrolled type 1 diabetes mellitus, recent hospitalization for DKA thought to be secondary to C. difficile returned to the ED with 24-hour history of nausea, vomiting, abdominal pain with diarrhea. CT of the abdomen and pelvis showed a resolved inflammatory changes around sigmoid colon and rectum with some residual thickening throughout distal sigmoid colon and rectum suggestive residual colitis. Patient seen by GI and underwent flexible sigmoidoscopy on 5/2 for persistent diarrhea which was negative for pseudomembranous colitis. Biopsies negative.   Assessment/Plan: Diarrhea with abdominal pain Flexible sigmoidoscopy without pseudomembranous colitis C. difficile negative.  serology for celiac sprue negative. Differential includes severe gastroparesis versus small bowel bacterial overgrowth. Continue dicyclomine and amitriptyline. Added cholestyramine. Increase dose of Imodium and added scheduled Lomotil.  Added empiric rifaximin and Creon. Continue when necessary Percocet and Dilaudid for pain control. EGD done  with biopsy negative for sprue. -Stool electrolytes and 24-hour stool collection for stool osmolality sent. Laxative abuse panel sent. Results pending. ( unfortunately was sent to [facility] on 5/7 and had to be sent again today.) -Appreciated GI follow-up and recommendations. -diarrhea much improved today. ( though pt denies improvement). Will not escalate pain meds.  instructed  pt to ambulate.   Uncontrolled type 1 diabetes mellitus with ? gastroparesis Last A1c of 11.8. Sees Dr. Cruzita Lederer. Uncontrolled with highly fluctuating blood glucose with episodes of hypo-and hyperglycemia.  continue current lantus dose.  Spoke with Dr Cruzita Lederer . Recommended current plan. Will follow her as outpt     Anemia of chronic  disease  Iron panel suggests anemia of chronic disease. TSH and B12 normal .  Diet: Diabetic  DVT prophylaxis: Subcutaneous Lovenox   Consults: [facility] GI  Procedure:  Flexible sigmoidoscopy on 5/2 CT abdomen and pelvis EGD on 5/5  Code Status: Full code Family Communication: None at bedside Disposition Plan: Home  once diarrhea improves or resolves. ( hopefully in the next 1-2 days)    Antibiotics:  Flagyl( stopped) Rifaximin since 5/6--  HPI/Subjective: Only one episode of diarrhea since past 12 hrs. noN/V   Objective: Filed Vitals:   07/01/14 0650  BP: 104/68  Pulse: 93  Temp: 98.7 F (37.1 C)  Resp: 16    Intake/Output Summary (Last 24 hours) at 07/01/14 1431 Last data filed at 07/01/14 0200  Gross per 24 hour  Intake   1560 ml  Output      0 ml  Net   1560 ml   Filed Weights   06/20/14 0015 06/25/14 0434 06/27/14 0751  Weight: 44 kg (97 lb) 40.7 kg (89 lb 11.6 oz) 43.092 kg (95 lb)    Exam:   General:  Young female in no acute distress  HEENT: moist oral mucosa, supple neck  Chest: Clear to auscultation bilaterally,   CVS: Normal S1 and S2, no murmurs or gallop  GI: Soft, nondistended,  epigastric tenderness  , bowel  sounds present  Musculoskeletal: Warm,      Data Reviewed: Basic Metabolic Panel:  Recent Labs Lab 06/26/14 0508 06/28/14 1520 06/29/14 0745 07/01/14 0950  NA 131* 137  --  140  K 4.8 3.9  --  4.1  CL 97* 103  --  102  CO2 24 30  --  28  GLUCOSE 407* 312* 562* 218*  BUN 22* 19  --  17  CREATININE 0.66 0.69  --  0.56  CALCIUM 9.0 8.9  --  10.0   Liver Function Tests:  Recent Labs Lab 06/26/14 0508  AST 47*  ALT 48  ALKPHOS 57  BILITOT 0.5  PROT 7.0  ALBUMIN 4.1   No results for input(s): LIPASE, AMYLASE in the last 168 hours. No results for input(s): AMMONIA in the last 168 hours. CBC:  Recent Labs Lab 06/29/14 0503  WBC 6.1  HGB 10.7*  HCT 32.0*  MCV 93.6  PLT 216   Cardiac Enzymes: No  results for input(s): CKTOTAL, CKMB, CKMBINDEX, TROPONINI in the last 168 hours. BNP (last 3 results) No results for input(s): BNP in the last 8760 hours.  ProBNP (last 3 results) No results for input(s): PROBNP in the last 8760 hours.  CBG:  Recent Labs Lab 06/30/14 1735 06/30/14 2244 07/01/14 0733 07/01/14 1250 07/01/14 1355  GLUCAP 216* 210* 363* 217* 172*    No results found for this or any previous visit (from the past 240 hour(s)).   Studies: No results found.  Scheduled Meds: . amitriptyline  100 mg Oral QHS  . colestipol  1 g Oral Q12H  . cyanocobalamin  500 mcg Oral BID  . dicyclomine  10 mg Oral TID AC & HS  . diphenoxylate-atropine  2 tablet Oral BID  . enoxaparin (LOVENOX) injection  30 mg Subcutaneous Q24H  . feeding supplement (GLUCERNA SHAKE)  237 mL Oral TID BM  . gabapentin  300 mg Oral QHS  . insulin aspart  0-15 Units Subcutaneous TID WC  . insulin aspart  0-5 Units Subcutaneous QHS  . insulin glargine  20 Units Subcutaneous q morning - 10a  . ketorolac  1 drop Left Eye QID  . lipase/protease/amylase  24,000 Units Oral TID AC  . loperamide  4 mg Oral TID AC & HS  . multivitamin with minerals  1 tablet Oral q morning - 10a  . ofloxacin  1 drop Left Eye QID  . prednisoLONE acetate  1 drop Left Eye QID  . rifaximin  550 mg Oral TID  . saccharomyces boulardii  250 mg Oral BID   Continuous Infusions:      Time spent: [place], [place]  [facility] Pager [phone] If 7PM-7AM, please contact night-coverage at [email or web address], password [facility] 07/01/2014, 2:31 PM  LOS: 12 days

## 2014-07-01 NOTE — Progress Notes (Signed)
    Progress Note   Subjective  Eating breakfast. States she is trying to get some nutrition in but vomits 1-2 times a day and still having multiple loose BMs a day   Objective   Vital signs in last 24 hours: Temp:  [98.5 F (36.9 C)-99.4 F (37.4 C)] 98.7 F (37.1 C) (05/09 0650) Pulse Rate:  [93-119] 93 (05/09 0650) Resp:  [16-18] 16 (05/09 0650) BP: (94-105)/(68-75) 104/68 mmHg (05/09 0650) SpO2:  [98 %-100 %] 98 % (05/09 0650) Last BM Date: 06/30/14 General:    white female in NAD Heart:  Regular rate and rhythm Abdomen:  Soft, nontender and nondistended. Normal bowel sounds. Extremities:  Without edema. Neurologic:  Alert and oriented,  grossly normal neurologically. Psych:  Cooperative. Normal mood and affect.    Lab Results:  Recent Labs  06/29/14 0503  WBC 6.1  HGB 10.7*  HCT 32.0*  PLT 216   BMET  Recent Labs  06/28/14 1520 06/29/14 0745  NA 137  --   K 3.9  --   CL 103  --   CO2 30  --   GLUCOSE 312* 562*  BUN 19  --   CREATININE 0.69  --   CALCIUM 8.9  --       Assessment / Plan:   8. 34 year old female with c-diff one month ago. She has chronic diarrhea, most recent c-diff on 4/28 was negative. She has had an extensive negative workup.  She is on xifaxan, pancreatic enzymes, maximum imodium, and cholestyramine. Laxative abuse panel and stool for electrolytes and osmolarity pending.   2. Type I diabetes.    LOS: 12 days   [name]  07/01/2014, 9:08 AM

## 2014-07-02 LAB — GLUCOSE, CAPILLARY
GLUCOSE-CAPILLARY: 172 mg/dL — AB (ref 70–99)
Glucose-Capillary: 427 mg/dL — ABNORMAL HIGH (ref 70–99)
Glucose-Capillary: 132 mg/dL — ABNORMAL HIGH (ref 70–99)
Glucose-Capillary: 307 mg/dL — ABNORMAL HIGH (ref 70–99)
Glucose-Capillary: 350 mg/dL — ABNORMAL HIGH (ref 70–99)
Glucose-Capillary: 71 mg/dL (ref 70–99)

## 2014-07-02 MED ORDER — COLESTIPOL HCL 1 G PO TABS: 4.0000 g | ORAL_TABLET | Freq: Two times a day (BID) | ORAL | Status: DC

## 2014-07-02 MED ORDER — COLESTIPOL HCL 5 G PO PACK
5.0000 g | PACK | Freq: Two times a day (BID) | ORAL | Status: DC
  Administered 2014-07-02 – 2014-07-06 (×8): 5 g via ORAL
  Filled 2014-07-02 (×10): qty 1

## 2014-07-02 MED ORDER — HYDROMORPHONE HCL 1 MG/ML IJ SOLN
1.0000 mg | Freq: Once | INTRAMUSCULAR | Status: AC
  Administered 2014-07-02: 1 mg via INTRAVENOUS
  Filled 2014-07-02: qty 1

## 2014-07-02 NOTE — Progress Notes (Signed)
Patient Blood Glucose 427. Dr. Clementeen Graham notified. Orders to follow scale of 15 units given.

## 2014-07-02 NOTE — Progress Notes (Signed)
    Progress Note   Subjective  Already had 4 loose BMs this am. Mornings seem to be the worst. Averaging 10 loose BMs a day but states she was having about 40 upon admission.     Objective   Vital signs in last 24 hours: Temp:  [98.7 F (37.1 C)-98.9 F (37.2 C)] 98.7 F (37.1 C) (05/10 0610) Pulse Rate:  [97-122] 97 (05/10 0610) Resp:  [16] 16 (05/10 0610) BP: (98-110)/(64-72) 98/64 mmHg (05/10 0610) SpO2:  [97 %-98 %] 98 % (05/10 0610) Last BM Date: 07/01/14 General:    white female in NAD Heart:  Regular rate and rhythm Abdomen:  Soft,nondistended, mild diffuse tenderness. Normal bowel sounds. Extremities:  Without edema. Neurologic:  Alert and oriented,  grossly normal neurologically. Psych:  Cooperative. Normal mood and affect.  BMET  Recent Labs  07/01/14 0950  NA 140  K 4.1  CL 102  CO2 28  GLUCOSE 218*  BUN 17  CREATININE 0.56  CALCIUM 10.0     Assessment / Plan:   67. 34 year old female with persistent diarrhea.  Extensive workup negative so far. I called lab- laxative abuse study, stool osmolarity, and fecal elastase all still pending. Taking imodium, creon, lomotil, xifaxan and bentyl. Awaiting stool studies.   2. DKA, blood sugar in 400s today.    LOS: 13 days   [name]  07/02/2014, 9:23 AM   [facility] Attending  I have also seen and assessed the patient and agree with the advanced practitioner's assessment and plan. Complicated situation with what sounds like  A secretory diarrhea. i have increased colestipol to 5 g bid to see if that helps more. Understand wide approach to treatment with multiple agents - will try to tailor these if we can. May need to consider alosetron or Viberzi  Gatha Mayer, MD, [facility] [phone] (pager) 07/02/2014 1:38 PM

## 2014-07-02 NOTE — Progress Notes (Signed)
[facility] PROGRESS NOTE  [name] ZOX:[id number] DOB: 11-07-1980 DOA: 06/19/2014 PCP: [name], MD  Brief narrative 34 year old female with history of uncontrolled type 1 diabetes mellitus, recent hospitalization for DKA thought to be secondary to C. difficile returned to the ED with 24-hour history of nausea, vomiting, abdominal pain with diarrhea. CT of the abdomen and pelvis showed a resolved inflammatory changes around sigmoid colon and rectum with some residual thickening throughout distal sigmoid colon and rectum suggestive residual colitis. Patient seen by GI and underwent flexible sigmoidoscopy on 5/2 for persistent diarrhea which was negative for pseudomembranous colitis. Biopsies negative. EGD done which was unremarkable. Hospital course prolonged due to ongoing diarrhea and uncontrolled blood glucose.   Assessment/Plan: Diarrhea with abdominal pain Flexible sigmoidoscopy without pseudomembranous colitis C. difficile negative.  serology for celiac sprue negative. Differential includes severe gastroparesis vs small bowel bacterial overgrowth vs secretory diarrhea. --diarrhea persists although improved since admission. Episodic nausea and  vomiting. Continue several medications including dicyclomine and amitriptyline. Added cholestyramine. Increase dose of Imodium and added scheduled Lomotil.  On empiric rifaximin and Creon. -added colestipol by GI today.  Continue when necessary Percocet and Dilaudid for pain control. EGD done  with biopsy negative for sprue. -Stool electrolytes and 24-hour stool collection for stool osmolality sent. Laxative abuse panel sent. Results pending. -Appreciated GI follow-up and recommendations.   Uncontrolled type 1 diabetes mellitus with ? gastroparesis Last A1c of 11.8. Sees Dr. Cruzita Lederer. Uncontrolled with highly fluctuating blood glucose with episodes of hypo-and hyperglycemia.  continue current lantus dose and SSI. Fingersticks  in 400s today. Has been very difficult to be aggressive on insulin given episodes of hypoglycemia. Spoke with Dr Cruzita Lederer on 5/9 . Recommended current plan. Will follow her as outpt    Anemia of chronic disease  Iron panel suggests anemia of chronic disease. TSH and B12 normal .  Diet: Diabetic  DVT prophylaxis: Subcutaneous Lovenox   Consults: [facility]  Procedure:  Flexible sigmoidoscopy on 5/2 CT abdomen and pelvis EGD on 5/5  Code Status: Full code Family Communication: None at bedside Disposition Plan: Hospital stay prolonged due to ongoing diarrhea, abdominal pain and severely uncontrolled diabetes. No definite date of discharge ( once symptoms better and blood glucose better controlled.     Antibiotics:  Flagyl( stopped) Rifaximin since 5/6--  HPI/Subjective: Only one episode of diarrhea since past 12 hrs. noN/V   Objective: Filed Vitals:   07/02/14 0610  BP: 98/64  Pulse: 97  Temp: 98.7 F (37.1 C)  Resp: 16    Intake/Output Summary (Last 24 hours) at 07/02/14 1349 Last data filed at 07/02/14 1016  Gross per 24 hour  Intake   1820 ml  Output    702 ml  Net   1118 ml   Filed Weights   06/20/14 0015 06/25/14 0434 06/27/14 0751  Weight: 44 kg (97 lb) 40.7 kg (89 lb 11.6 oz) 43.092 kg (95 lb)    Exam:   General:  Young thin built female in no acute distress  HEENT: moist oral mucosa, supple neck  Chest: Clear to auscultation bilaterally,   CVS: Normal S1 and S2, no murmurs or gallop  GI: Soft, nondistended,  epigastric tenderness  , bowel  sounds present  Musculoskeletal: Warm, no edema     Data Reviewed: Basic Metabolic Panel:  Recent Labs Lab 06/26/14 0508 06/28/14 1520 06/29/14 0745 07/01/14 0950  NA 131* 137  --  140  K 4.8 3.9  --  4.1  CL 97* 103  --  102  CO2 24 30  --  28  GLUCOSE 407* 312* 562* 218*  BUN 22* 19  --  17  CREATININE 0.66 0.69  --  0.56  CALCIUM 9.0 8.9  --  10.0   Liver Function Tests:  Recent  Labs Lab 06/26/14 0508  AST 47*  ALT 48  ALKPHOS 57  BILITOT 0.5  PROT 7.0  ALBUMIN 4.1   No results for input(s): LIPASE, AMYLASE in the last 168 hours. No results for input(s): AMMONIA in the last 168 hours. CBC:  Recent Labs Lab 06/29/14 0503  WBC 6.1  HGB 10.7*  HCT 32.0*  MCV 93.6  PLT 216   Cardiac Enzymes: No results for input(s): CKTOTAL, CKMB, CKMBINDEX, TROPONINI in the last 168 hours. BNP (last 3 results) No results for input(s): BNP in the last 8760 hours.  ProBNP (last 3 results) No results for input(s): PROBNP in the last 8760 hours.  CBG:  Recent Labs Lab 07/01/14 2040 07/01/14 2129 07/02/14 0719 07/02/14 1000 07/02/14 1154  GLUCAP 69* 163* 427* 132* 172*    No results found for this or any previous visit (from the past 240 hour(s)).   Studies: No results found.  Scheduled Meds: . amitriptyline  100 mg Oral QHS  . colestipol  5 g Oral BID WC  . cyanocobalamin  500 mcg Oral BID  . dicyclomine  10 mg Oral TID AC & HS  . diphenoxylate-atropine  2 tablet Oral BID  . enoxaparin (LOVENOX) injection  30 mg Subcutaneous Q24H  . feeding supplement (GLUCERNA SHAKE)  237 mL Oral TID BM  . gabapentin  300 mg Oral QHS  . insulin aspart  0-15 Units Subcutaneous TID WC  . insulin aspart  0-5 Units Subcutaneous QHS  . insulin glargine  20 Units Subcutaneous q morning - 10a  . ketorolac  1 drop Left Eye QID  . lipase/protease/amylase  24,000 Units Oral TID AC  . loperamide  4 mg Oral TID AC & HS  . multivitamin with minerals  1 tablet Oral q morning - 10a  . ofloxacin  1 drop Left Eye QID  . prednisoLONE acetate  1 drop Left Eye QID  . rifaximin  550 mg Oral TID  . saccharomyces boulardii  250 mg Oral BID   Continuous Infusions:      Time spent: [place], [place]  [facility] Pager [phone] If 7PM-7AM, please contact night-coverage at [email or web address], password [facility] 07/02/2014, 1:49 PM  LOS: 13 days

## 2014-07-03 LAB — GLUCOSE, CAPILLARY
GLUCOSE-CAPILLARY: 156 mg/dL — AB (ref 70–99)
Glucose-Capillary: 299 mg/dL — ABNORMAL HIGH (ref 70–99)
Glucose-Capillary: 239 mg/dL — ABNORMAL HIGH (ref 70–99)
Glucose-Capillary: 260 mg/dL — ABNORMAL HIGH (ref 70–99)

## 2014-07-03 MED ORDER — ALUM & MAG HYDROXIDE-SIMETH 200-200-20 MG/5ML PO SUSP
30.0000 mL | Freq: Once | ORAL | Status: AC
  Administered 2014-07-03: 30 mL via ORAL
  Filled 2014-07-03: qty 30

## 2014-07-03 NOTE — Progress Notes (Signed)
[facility] PROGRESS NOTE  [name] NFA:[id number] DOB: 1980/07/21 DOA: 06/19/2014 PCP: [name], MD  Brief narrative 34 year old female with history of uncontrolled type 1 diabetes mellitus, recent hospitalization for DKA thought to be secondary to C. difficile returned to the ED with 24-hour history of nausea, vomiting, abdominal pain with diarrhea. CT of the abdomen and pelvis showed a resolved inflammatory changes around sigmoid colon and rectum with some residual thickening throughout distal sigmoid colon and rectum suggestive residual colitis. Patient seen by GI and underwent flexible sigmoidoscopy on 5/2 for persistent diarrhea which was negative for pseudomembranous colitis. Biopsies negative. EGD done which was unremarkable. Hospital course prolonged due to ongoing diarrhea and uncontrolled blood glucose.   HPI/Subjective:  In bed, denies any headache. No chest abdominal pain, had 3-4 bowel movements since this morning. No focal weakness.     Assessment/Plan: Diarrhea with abdominal pain Flexible sigmoidoscopy without pseudomembranous colitis, PCR for C. difficile negative. EGD stable. Celiac sprue workup negative. GI On board and likely diarrhea due to secretory problems. On multiple medications per GI which include dicyclomine, amitriptyline, cholestyramine, colestipol, Imodium and added scheduled Lomotil also on empiric rifaximin and Creon. -Continue pain control with supportive care -Stool electrolytes and 24-hour stool collection for stool osmolality sent. Laxative abuse panel sent. We will await GI for further input.    Uncontrolled type 1 diabetes mellitus with ? gastroparesis Last A1c of 11.8. Sees Dr. Cruzita Lederer. Uncontrolled with highly fluctuating blood glucose with episodes of hypo-and hyperglycemia.  continue current lantus dose and SSI.  CBG (last 3)   Recent Labs  07/02/14 1725 07/02/14 2157 07/03/14 0717  GLUCAP 350* 71 299*     Anemia  of chronic disease  Iron panel suggests anemia of chronic disease. TSH and B12 normal .    Diet: Diabetic  DVT prophylaxis: Subcutaneous Lovenox   Consults: [facility], endocrine consult with Dr Cruzita Lederer over the phone by previous physician    Procedure:  Flexible sigmoidoscopy on 5/2 CT abdomen and pelvis EGD on 5/5   Code Status: Full code Family Communication: None at bedside Disposition Plan: Hospital stay prolonged due to ongoing diarrhea, abdominal pain and severely uncontrolled diabetes. No definite date of discharge ( once symptoms better and blood glucose better controlled.     Antibiotics:  Flagyl( stopped) Rifaximin since 5/6--   Objective: Filed Vitals:   07/03/14 0554  BP: 105/64  Pulse: 102  Temp: 98.1 F (36.7 C)  Resp: 16    Intake/Output Summary (Last 24 hours) at 07/03/14 1004 Last data filed at 07/02/14 2200  Gross per 24 hour  Intake   1560 ml  Output      0 ml  Net   1560 ml   Filed Weights   06/20/14 0015 06/25/14 0434 06/27/14 0751  Weight: 44 kg (97 lb) 40.7 kg (89 lb 11.6 oz) 43.092 kg (95 lb)    Exam:   General:  Young thin built female in no acute distress  HEENT: moist oral mucosa, supple neck  Chest: Clear to auscultation bilaterally,   CVS: Normal S1 and S2, no murmurs or gallop  GI: Soft, nondistended,  epigastric tenderness  , bowel  sounds present  Musculoskeletal: Warm, no edema     Data Reviewed: Basic Metabolic Panel:  Recent Labs Lab 06/28/14 1520 06/29/14 0745 07/01/14 0950  NA 137  --  140  K 3.9  --  4.1  CL 103  --  102  CO2 30  --  28  GLUCOSE  312* 562* 218*  BUN 19  --  17  CREATININE 0.69  --  0.56  CALCIUM 8.9  --  10.0   Liver Function Tests: No results for input(s): AST, ALT, ALKPHOS, BILITOT, PROT, ALBUMIN in the last 168 hours. No results for input(s): LIPASE, AMYLASE in the last 168 hours. No results for input(s): AMMONIA in the last 168 hours. CBC:  Recent Labs Lab  06/29/14 0503  WBC 6.1  HGB 10.7*  HCT 32.0*  MCV 93.6  PLT 216   Cardiac Enzymes: No results for input(s): CKTOTAL, CKMB, CKMBINDEX, TROPONINI in the last 168 hours. BNP (last 3 results) No results for input(s): BNP in the last 8760 hours.  ProBNP (last 3 results) No results for input(s): PROBNP in the last 8760 hours.  CBG:  Recent Labs Lab 07/02/14 1154 07/02/14 1414 07/02/14 1725 07/02/14 2157 07/03/14 0717  GLUCAP 172* 307* 350* 71 299*    No results found for this or any previous visit (from the past 240 hour(s)).   Studies: No results found.  Scheduled Meds: . amitriptyline  100 mg Oral QHS  . colestipol  5 g Oral BID WC  . cyanocobalamin  500 mcg Oral BID  . dicyclomine  10 mg Oral TID AC & HS  . diphenoxylate-atropine  2 tablet Oral BID  . enoxaparin (LOVENOX) injection  30 mg Subcutaneous Q24H  . feeding supplement (GLUCERNA SHAKE)  237 mL Oral TID BM  . gabapentin  300 mg Oral QHS  . insulin aspart  0-15 Units Subcutaneous TID WC  . insulin aspart  0-5 Units Subcutaneous QHS  . insulin glargine  20 Units Subcutaneous q morning - 10a  . ketorolac  1 drop Left Eye QID  . lipase/protease/amylase  24,000 Units Oral TID AC  . loperamide  4 mg Oral TID AC & HS  . multivitamin with minerals  1 tablet Oral q morning - 10a  . ofloxacin  1 drop Left Eye QID  . prednisoLONE acetate  1 drop Left Eye QID  . rifaximin  550 mg Oral TID  . saccharomyces boulardii  250 mg Oral BID   Continuous Infusions:    Time spent: [facility] Pager [phone] If 7PM-7AM, please contact night-coverage at [email or web address], password [facility] 07/03/2014, 10:04 AM  LOS: 14 days

## 2014-07-03 NOTE — Progress Notes (Signed)
    Progress Note   Subjective  Four loose BMs this am (early morning). Often incontinent of stool in early am. No major diarrhea in 7 hours as opposed to yesterday when she had diarrhea and vomiting " all day long".    Objective   Vital signs in last 24 hours: Temp:  [98.1 F (36.7 C)-98.9 F (37.2 C)] 98.6 F (37 C) (05/11 1010) Pulse Rate:  [98-114] 108 (05/11 1010) Resp:  [16] 16 (05/11 1010) BP: (94-105)/(64-77) 94/70 mmHg (05/11 1010) SpO2:  [97 %-100 %] 97 % (05/11 1010) Last BM Date: 07/02/14 General:    white female in NAD Heart:  Regular rate and rhythm Lungs: Respirations even and unlabored, lungs CTA bilaterally Abdomen:  Soft, nonistended. She winces with even light palpation of lower abdomen. Normal bowel sounds. Extremities:  Without edema. Neurologic:  Alert and oriented,  grossly normal neurologically. Psych:  Cooperative. Normal mood and affect.      Assessment / Plan:   34 year old female with persistent diarrhea. Extensive workup negative so far. Stool studies still in process.  On Xifaxan, imodium and lomotil. We increased Colestid to 5mg  bid yesterday.  She seems to be a little better today. Interestingly, diarrhea is frequently in am prior to even eating breakfast. Awaiting stool studies.     LOS: 14 days   [name]  07/03/2014, 12:20 PM  Agree with Ms. Guenther's assessment and plan. Gatha Mayer, MD, [facility]

## 2014-07-04 LAB — CBC
HCT: 36 % (ref 36.0–46.0)
HEMOGLOBIN: 12.1 g/dL (ref 12.0–15.0)
MCH: 31.2 pg (ref 26.0–34.0)
MCHC: 33.6 g/dL (ref 30.0–36.0)
MCV: 92.8 fL (ref 78.0–100.0)
PLATELETS: 290 10*3/uL (ref 150–400)
RBC: 3.88 MIL/uL (ref 3.87–5.11)
RDW: 12.8 % (ref 11.5–15.5)
WBC: 6.5 10*3/uL (ref 4.0–10.5)

## 2014-07-04 LAB — BASIC METABOLIC PANEL
Anion gap: 11 (ref 5–15)
BUN: 17 mg/dL (ref 6–20)
CALCIUM: 9.6 mg/dL (ref 8.9–10.3)
CO2: 28 mmol/L (ref 22–32)
Chloride: 95 mmol/L — ABNORMAL LOW (ref 101–111)
Creatinine, Ser: 0.64 mg/dL (ref 0.44–1.00)
GFR calc Af Amer: >60 mL/min (ref >60)
GFR calc non Af Amer: >60 mL/min (ref >60)
GLUCOSE: 412 mg/dL — AB (ref 65–99)
Potassium: 4.2 mmol/L (ref 3.5–5.1)
Sodium: 134 mmol/L — ABNORMAL LOW (ref 135–145)

## 2014-07-04 LAB — GLUCOSE, CAPILLARY
Glucose-Capillary: 390 mg/dL — ABNORMAL HIGH (ref 65–99)
Glucose-Capillary: 264 mg/dL — ABNORMAL HIGH (ref 65–99)
Glucose-Capillary: 273 mg/dL — ABNORMAL HIGH (ref 65–99)
Glucose-Capillary: 219 mg/dL — ABNORMAL HIGH (ref 65–99)

## 2014-07-04 LAB — MAGNESIUM: MAGNESIUM: 2 mg/dL (ref 1.7–2.4)

## 2014-07-04 MED ORDER — HYDROMORPHONE HCL 1 MG/ML IJ SOLN
0.5000 mg | Freq: Once | INTRAMUSCULAR | Status: AC
  Administered 2014-07-04: 0.5 mg via INTRAVENOUS
  Filled 2014-07-04: qty 1

## 2014-07-04 MED ORDER — INSULIN GLARGINE 100 UNIT/ML ~~LOC~~ SOLN
13.0000 [IU] | Freq: Two times a day (BID) | SUBCUTANEOUS | Status: DC
  Administered 2014-07-04 (×2): 13 [IU] via SUBCUTANEOUS
  Filled 2014-07-04 (×3): qty 0.13

## 2014-07-04 NOTE — Progress Notes (Signed)
Nutrition Follow-up  DOCUMENTATION CODES:  Severe malnutrition in context of chronic illness  INTERVENTION:  Glucerna shake  NUTRITION DIAGNOSIS:  Malnutrition related to altered GI function as evidenced by severe depletion of body fat.  ongoing  GOAL:  Weight gain  ongoing  MONITOR:  PO intake, Supplement acceptance, Diet advancement, Labs, Weight trends  REASON FOR ASSESSMENT:  Other (Comment) (Low BMI)    ASSESSMENT: 34 y.o. female with history of diabetes mellitus type 1 who was recently admitted for diabetic ketoacidosis and C. difficile colitis and was discharged home on Flagyl and presents to the ER because of worsening diarrhea over the last 24 hours. Patient states over the last 24 hours patient has been having severe diarrhea multiple locations denies any blood in the diarrhea. She also had a couple of episodes of nausea and vomiting. Patient also has been having crampy abdominal pain. Denies any sick contacts or recent travels. Patient states she did take her Lantus today but has not taken her sliding scale coverage as patient was not able to eat well because of the vomiting. Patient's blood sugar is elevated but not in DKA.   5/12 Per MD note: EGD, flexible sigmoidoscopy unremarkable, C. difficile PCR negative. Celiac sprue workup negative. GI On board and likely diarrhea due to secretory problems.   Hospital course prolonged due to ongoing diarrhea and uncontrolled blood glucose.   Current diet is regular diet.  Pt reports not eating as well today. In bed not feeling well with complains of nausea/vomiting and sweats. Diarrhea is ongoing.  CBGs elevated Wants to continue nutritional supplements.  RD will continue to monitor.   Labs: CBGs 156-390 Na 134 Cl 95  Height:  Ht Readings from Last 1 Encounters:  06/27/14 5\' 5"  (1.651 m)    Weight:  Wt Readings from Last 1 Encounters:  06/27/14 95 lb (43.092 kg)    Ideal Body Weight:  56.8 kg  Wt  Readings from Last 10 Encounters:  06/27/14 95 lb (43.092 kg)  06/11/14 95 lb (43.092 kg)  05/28/14 99 lb 3.3 oz (45 kg)  05/09/14 100 lb (45.36 kg)  04/02/14 110 lb (49.896 kg)  03/30/14 101 lb 13.6 oz (46.2 kg)  03/25/14 106 lb (48.081 kg)  03/05/14 122 lb 9.2 oz (55.6 kg)  02/19/14 109 lb (49.442 kg)  02/03/14 123 lb 3.8 oz (55.9 kg)    BMI:  Body mass index is 15.81 kg/(m^2).  Estimated Nutritional Needs:  Kcal:  1400-1600  Protein:  80-90 g  Fluid:  1.6 L/day  Skin:  Reviewed, no issues  Diet Order:  Diet Carb Modified Fluid consistency:: Thin; Room service appropriate?: Yes  EDUCATION NEEDS:  Education needs addressed   Intake/Output Summary (Last 24 hours) at 07/04/14 1147 Last data filed at 07/04/14 0749  Gross per 24 hour  Intake   1320 ml  Output   2100 ml  Net   -780 ml    Last BM:  5/11, loose  Laurette Schimke [place], [place], [place]

## 2014-07-04 NOTE — Progress Notes (Signed)
[facility] PRO[facility]ESS NOTE  [name] DGL:[id number] DOB: [birth year] DOA: 06/19/2014 PCP: [name], MD  Brief narrative 34 year old female with history of uncontrolled type 1 diabetes mellitus, recent hospitalization for DKA thought to be secondary to C. difficile returned to the ED with 24-hour history of nausea, vomiting, abdominal pain with diarrhea. CT of the abdomen and pelvis showed a resolved inflammatory changes around sigmoid colon and rectum with some residual thickening throughout distal sigmoid colon and rectum suggestive residual colitis. Patient seen by GI and underwent flexible sigmoidoscopy on 5/2 for persistent diarrhea which was negative for pseudomembranous colitis. Biopsies negative. EGD done which was unremarkable. Hospital course prolonged due to ongoing diarrhea and uncontrolled blood glucose.   HPI/Subjective:  In bed, denies any headache. No chest abdominal pain, diarrhea has improved somewhat. Appears comfortable, eating copious amounts of breakfast. Plenty of appears to have been consumed.    Assessment/Plan:  Diarrhea with abdominal pain  -So far EGD, flexible sigmoidoscopy unremarkable, C. difficile PCR negative. Celiac sprue workup negative. GI On board and likely diarrhea due to secretory problems. On multiple medications per GI which include florastor, Bentyl, Xifaxan, cholestyramine, creon, colestipol, lomotil & Imodium.   -Continue pain control with supportive care, appears comfortable will continue oral pain meds and DC IV at this point.  -Stool electrolytes and 24-hour stool collection for stool osmolality sent. Laxative abuse panel sent. We will await GI for further input.    Uncontrolled type 1 diabetes mellitus with ? gastroparesis Last A1c of 11.8. Sees Dr. Cruzita Lederer. Uncontrolled with highly fluctuating blood glucose with episodes of hypo-and hyperglycemia.  I will adjusted Lantus on 07/04/2014 for better control, continue with  SSI.    CBG (last 3)   Recent Labs  07/03/14 1731 07/03/14 2111 07/04/14 0742  GLUCAP 156* 260* 390*     Anemia of chronic disease  Iron panel suggests anemia of chronic disease. TSH and B12 normal .     Diet: Diabetic  DVT prophylaxis: Subcutaneous Lovenox    Consults: [facility], endocrine consult with Dr Cruzita Lederer over the phone by previous physician    Procedure:  Flexible sigmoidoscopy on 5/2 CT abdomen and pelvis EGD on 5/5   Code Status: Full code Family Communication: None at bedside Disposition Plan: Hospital stay prolonged due to ongoing diarrhea, abdominal pain and severely uncontrolled diabetes. No definite date of discharge ( once symptoms better and blood glucose better controlled.   Antibiotics:  Anti-infectives    Start     Dose/Rate Route Frequency Ordered Stop   06/28/14 1130  rifaximin (XIFAXAN) tablet 550 mg     550 mg Oral 3 times daily 06/28/14 1002 07/12/14 0959   06/21/14 1800  [MAR Hold]  metroNIDAZOLE (FLAGYL) tablet 500 mg  Status:  Discontinued     (MAR Hold since 06/24/14 0939)   500 mg Oral 3 times per day 06/21/14 1650 06/24/14 1043   06/20/14 0012  metroNIDAZOLE (FLAGYL) IVPB 500 mg  Status:  Discontinued     500 mg 100 mL/hr over 60 Minutes Intravenous Every 8 hours 06/20/14 0013 06/21/14 0852         Objective: Filed Vitals:   07/03/14 2227  BP: 100/65  Pulse: 100  Temp: 98.3 F (36.8 C)  Resp: 16    Intake/Output Summary (Last 24 hours) at 07/04/14 0917 Last data filed at 07/04/14 0749  Gross per 24 hour  Intake   1680 ml  Output   2100 ml  Net   -420 ml  Filed Weights   06/20/14 0015 06/25/14 0434 06/27/14 0751  Weight: 44 kg (97 lb) 40.7 kg (89 lb 11.6 oz) 43.092 kg (95 lb)    Exam:   General:  Young thin built female in no acute distress  HEENT: moist oral mucosa, supple neck  Chest: Clear to auscultation bilaterally,   CVS: Normal S1 and S2, no murmurs or gallop  GI: Soft, nondistended,   epigastric tenderness  , bowel  sounds present  Musculoskeletal: Warm, no edema     Data Reviewed: Basic Metabolic Panel:  Recent Labs Lab 06/28/14 1520 06/29/14 0745 07/01/14 0950 07/04/14 0830  NA 137  --  140 134*  K 3.9  --  4.1 4.2  CL 103  --  102 95*  CO2 30  --  28 28  GLUCOSE 312* 562* 218* 412*  BUN 19  --  17 17  CREATININE 0.69  --  0.56 0.64  CALCIUM 8.9  --  10.0 9.6  MG  --   --   --  2.0   Liver Function Tests: No results for input(s): AST, ALT, ALKPHOS, BILITOT, PROT, ALBUMIN in the last 168 hours. No results for input(s): LIPASE, AMYLASE in the last 168 hours. No results for input(s): AMMONIA in the last 168 hours. CBC:  Recent Labs Lab 06/29/14 0503 07/04/14 0830  WBC 6.1 6.5  HGB 10.7* 12.1  HCT 32.0* 36.0  MCV 93.6 92.8  PLT 216 290   Cardiac Enzymes: No results for input(s): CKTOTAL, CKMB, CKMBINDEX, TROPONINI in the last 168 hours. BNP (last 3 results) No results for input(s): BNP in the last 8760 hours.  ProBNP (last 3 results) No results for input(s): PROBNP in the last 8760 hours.  CBG:  Recent Labs Lab 07/03/14 0717 07/03/14 1145 07/03/14 1731 07/03/14 2111 07/04/14 0742  GLUCAP 299* 239* 156* 260* 390*    No results found for this or any previous visit (from the past 240 hour(s)).   Studies: No results found.  Scheduled Meds: . amitriptyline  100 mg Oral QHS  . colestipol  5 g Oral BID WC  . cyanocobalamin  500 mcg Oral BID  . dicyclomine  10 mg Oral TID AC & HS  . diphenoxylate-atropine  2 tablet Oral BID  . enoxaparin (LOVENOX) injection  30 mg Subcutaneous Q24H  . feeding supplement (GLUCERNA SHAKE)  237 mL Oral TID BM  . gabapentin  300 mg Oral QHS  . insulin aspart  0-15 Units Subcutaneous TID WC  . insulin aspart  0-5 Units Subcutaneous QHS  . insulin glargine  20 Units Subcutaneous q morning - 10a  . ketorolac  1 drop Left Eye QID  . lipase/protease/amylase  24,000 Units Oral TID AC  . loperamide  4 mg  Oral TID AC & HS  . multivitamin with minerals  1 tablet Oral q morning - 10a  . ofloxacin  1 drop Left Eye QID  . prednisoLONE acetate  1 drop Left Eye QID  . rifaximin  550 mg Oral TID  . saccharomyces boulardii  250 mg Oral BID   Continuous Infusions:    Time spent: [facility] Pager [phone] If 7PM-7AM, please contact night-coverage at [email or web address], password [facility] 07/04/2014, 9:17 AM  LOS: 15 days

## 2014-07-04 NOTE — Progress Notes (Signed)
    Progress Note   Subjective  awoken from sleep. Complains of lower abdominal discomfort. Diarrhea multiple times during night shift.     Objective   Vital signs in last 24 hours: Temp:  [98.3 F (36.8 C)-98.6 F (37 C)] 98.3 F (36.8 C) (05/11 2227) Pulse Rate:  [100-108] 100 (05/11 2227) Resp:  [16] 16 (05/11 2227) BP: (94-100)/(65-70) 100/65 mmHg (05/11 2227) SpO2:  [97 %-98 %] 98 % (05/11 2227) Last BM Date: 07/03/14 General:    white female in NAD Heart:  Regular rate and rhythm; no murmurs Lungs: Respirations even and unlabored, lungs CTA bilaterally Abdomen:  Soft, nondistended, diffuse lower abdominal tenderness. Normal bowel sounds. Extremities:  Without edema. Neurologic:  Alert and oriented,  grossly normal neurologically. Psych:  Cooperative. Normal mood and affect.  Intake/Output from previous day: 05/11 0701 - 05/12 0700 In: 1680 [P.O.:1440; I.V.:240] Out: 2100 [Urine:600; Stool:1500] Intake/Output this shift: Total I/O In: 240 [P.O.:240] Out: -   Lab Results:  Recent Labs  07/04/14 0830  WBC 6.5  HGB 12.1  HCT 36.0  PLT 290   BMET  Recent Labs  07/01/14 0950 07/04/14 0830  NA 140 134*  K 4.1 4.2  CL 102 95*  CO2 28 28  GLUCOSE 218* 412*  BUN 17 17  CREATININE 0.56 0.64  CALCIUM 10.0 9.6      Assessment / Plan:   77. 34 year old female with persistent diarrhea. Extensive workup negative so far. Stool studies still in process. On Xifaxan, imodium and lomotil. We increased Colestid to 5mg  bid. I spoke to nurse. Stools being measured. She had 1200cc malodorous, watery stool and two episodes of vomiting on night shift .  Interestingly, diarrhea is frequently in am prior to even eating breakfast suggesting more of a secretory than osmotic diarrhea. Awaiting stool studies. Ordering VIP level and a 24 hour urine for HIAA.  2. Diabetes, glucose 412 this am.     LOS: 15 days   [name]  07/04/2014, 9:12 AM   [facility]  Attending  I have also seen and assessed the patient and agree with the advanced practitioner's assessment and plan. No new recommendations. Still trying to understand cause but suspect diabetic diarrhea which is very difficult to treat.   Other options would be clonidine or octreotide.  Gatha Mayer, MD, [facility] [phone] (pager) 07/04/2014 4:06 PM

## 2014-07-04 NOTE — Progress Notes (Signed)
Patient appears to be resting comfortably in bed at this time. Patient requested pain medication, and percocet, 2 tablets available. RN brought medication to patient, and patient requested a one time dose of IV dilaudid. RN paged attending MD about patients request.

## 2014-07-05 LAB — GLUCOSE, CAPILLARY
GLUCOSE-CAPILLARY: 347 mg/dL — AB (ref 65–99)
GLUCOSE-CAPILLARY: 213 mg/dL — AB (ref 65–99)
GLUCOSE-CAPILLARY: 265 mg/dL — AB (ref 65–99)
GLUCOSE-CAPILLARY: 278 mg/dL — AB (ref 65–99)

## 2014-07-05 LAB — SODIUM, STOOL: SODIUM STL: 58 meq/L

## 2014-07-05 LAB — PANCREATIC ELASTASE, FECAL

## 2014-07-05 LAB — PHENOLPHTHALEIN, STOOL: Phenolphthalein, Stl: NEGATIVE

## 2014-07-05 LAB — POTASSIUM, STOOL: Potassium, Stl: 31 mEq/L

## 2014-07-05 MED ORDER — INSULIN ASPART 100 UNIT/ML ~~LOC~~ SOLN: 3.0000 [IU] | Freq: Three times a day (TID) | SUBCUTANEOUS | Status: DC

## 2014-07-05 MED ORDER — OXYCODONE-ACETAMINOPHEN 5-325 MG PO TABS
2.0000 | ORAL_TABLET | ORAL | Status: DC | PRN
  Administered 2014-07-05 – 2014-07-06 (×5): 2 via ORAL
  Filled 2014-07-05 (×5): qty 2

## 2014-07-05 MED ORDER — FENTANYL CITRATE (PF) 100 MCG/2ML IJ SOLN
50.0000 ug | Freq: Four times a day (QID) | INTRAMUSCULAR | Status: DC | PRN
  Administered 2014-07-05 – 2014-07-06 (×4): 50 ug via INTRAVENOUS
  Filled 2014-07-05 (×4): qty 2

## 2014-07-05 MED ORDER — INSULIN GLARGINE 100 UNIT/ML ~~LOC~~ SOLN
18.0000 [IU] | Freq: Two times a day (BID) | SUBCUTANEOUS | Status: DC
  Administered 2014-07-05: 18 [IU] via SUBCUTANEOUS
  Filled 2014-07-05 (×3): qty 0.18

## 2014-07-05 MED ORDER — LORAZEPAM 0.5 MG PO TABS
0.5000 mg | ORAL_TABLET | Freq: Once | ORAL | Status: AC
  Administered 2014-07-05: 0.5 mg via ORAL
  Filled 2014-07-05: qty 1

## 2014-07-05 MED ORDER — TRAMADOL HCL 50 MG PO TABS
50.0000 mg | ORAL_TABLET | Freq: Four times a day (QID) | ORAL | Status: DC | PRN
  Administered 2014-07-05 – 2014-07-06 (×3): 50 mg via ORAL
  Filled 2014-07-05 (×3): qty 1

## 2014-07-05 MED ORDER — LORAZEPAM 2 MG/ML IJ SOLN
0.5000 mg | Freq: Three times a day (TID) | INTRAMUSCULAR | Status: DC | PRN
  Administered 2014-07-06: 0.5 mg via INTRAVENOUS
  Filled 2014-07-05: qty 1

## 2014-07-05 NOTE — Progress Notes (Signed)
Pt complaining of pain and nausea this morning. Asked that doctor restart her IV dilaudid. Dr. Candiss Norse spoke with patient this morning about her pain and the need for pain medication. Although patient complains of pain, she immediately falls asleep, sometimes while speaking to you. She appears comfortable. Will continue to medicate as necessary and meet patient's needs, as appropriate.

## 2014-07-05 NOTE — Progress Notes (Addendum)
    Progress Note   Subjective  abdominal pain, just took ultram. Vomited this am. No significant diarrhea.    Objective   Vital signs in last 24 hours: Temp:  [97.9 F (36.6 C)-98.9 F (37.2 C)] 97.9 F (36.6 C) (05/13 0459) Pulse Rate:  [96-103] 100 (05/13 0459) Resp:  [16] 16 (05/13 0459) BP: (90-106)/(61-72) 106/72 mmHg (05/13 0459) SpO2:  [96 %-100 %] 100 % (05/13 0459) Weight:  [97 lb (44 kg)] 97 lb (44 kg) (05/13 1119) Last BM Date: 07/04/14 General:    white female in NAD. Mother at bedside Abdomen:  Soft, diffusely tender with minimal palpation. Nondistended. Hyperactive bowel sounds.  Extremities:  Without edema. Neurologic:  Alert and oriented,  grossly normal neurologically. Psych:  Cooperative. Normal mood and affect.  Intake/Output from previous day: 05/12 0701 - 05/13 0700 In: 1560 [P.O.:1320; I.V.:240] Out: 1950 [Urine:800; Stool:1150] Intake/Output this shift: Total I/O In: 600 [P.O.:600] Out: 350 [Urine:350]  Lab Results:  Recent Labs  07/04/14 0830  WBC 6.5  HGB 12.1  HCT 36.0  PLT 290   BMET  Recent Labs  07/04/14 0830  NA 134*  K 4.2  CL 95*  CO2 28  GLUCOSE 412*  BUN 17  CREATININE 0.64  CALCIUM 9.6      Assessment / Plan:    71. 34 year old female with persistent diarrhea. Extensive workup negative so far. On Xifaxan, imodium and lomotil, creon, florastor and Colestid . She had 1100cc diarrhea yesterday. Flexiseal placed a few minutes ago to for strict measuring. Await VIP, urine HIAA and remaining stools studies. Patient states her diarrhea has slowed down now because she isn't holding down / taking in much PO. Of note, her bowel sounds are on hyperactive side today.   2. Diabetes, blood sugars have been difficult to control.     LOS: 16 days   [name]  07/05/2014, 12:46 PM  [facility] Attending  I have also seen and assessed the patient and agree with the advanced practitioner's assessment and plan. Continue w/  current w/u and tx  Working dx is diabetic diarrhea. Stool collection device placed to accurately quantify stool output as there is some ? Of dilution of stool. Diabetic diarrhea is related to autonomic neuropathy, bacterial overgrowth, pancreatic insufficiency and motilility issues.  She has had 1 week of Xifaxan so far w/ another 7 to go for full Tx IBS-D - but would actually be a Tx for bacterial overgrowth.  We plan to f/u Monday - Dr. Collene Mares is on call this weekend if needed.   Gatha Mayer, MD, [facility] [phone] (pager) 07/05/2014 4:14 PM

## 2014-07-05 NOTE — Progress Notes (Signed)
[facility] PROGRESS NOTE  [name] NTI:[id number] DOB: 01/08/1981 DOA: 06/19/2014 PCP: [name], MD  Brief narrative 34 year old female with history of uncontrolled type 1 diabetes mellitus, recent hospitalization for DKA thought to be secondary to C. difficile returned to the ED with 24-hour history of nausea, vomiting, abdominal pain with diarrhea. CT of the abdomen and pelvis showed a resolved inflammatory changes around sigmoid colon and rectum with some residual thickening throughout distal sigmoid colon and rectum suggestive residual colitis. Patient seen by GI and underwent flexible sigmoidoscopy on 5/2 for persistent diarrhea which was negative for pseudomembranous colitis. Biopsies negative. EGD done which was unremarkable. Hospital course prolonged due to ongoing diarrhea and uncontrolled blood glucose.   HPI/Subjective:  In bed, denies any headache. No chest abdominal pain, diarrhea continues per patient. Appears comfortable, eating copious amounts of breakfast. Plenty of appears to have been consumed.    Assessment/Plan:  Diarrhea with abdominal pain  -So far EGD, flexible sigmoidoscopy unremarkable, C. difficile PCR negative. Celiac sprue workup negative. GI On board and likely diarrhea due to secretory problems. On multiple medications per GI which include florastor, Bentyl, Xifaxan, cholestyramine, creon, colestipol, lomotil & Imodium.  -Continue pain control with supportive care, appears comfortable will continue oral pain meds and DC IV at this point. Patient is exhibiting narcotic seeking behavior for asking IV narcotics despite appearing completely comfortable and in no discomfort whatsoever.  -Stool electrolytes and 24-hour stool collection for stool osmolality sent. Laxative abuse panel sent. 5 HIAA ordered and pending by GI, We will await GI for further input. Discussed her case with GI physician Dr. Silvano Rusk on 07/05/2014. 24-hour rectal tube  placement for strict stool output measurement.    Uncontrolled type 1 diabetes mellitus with ? gastroparesis Last A1c of 11.8. Sees Dr. Cruzita Lederer. Uncontrolled with highly fluctuating blood glucose with episodes of hypo-and hyperglycemia.  I will adjusted her Lantus, and pre-meal NovoLog along with sliding scale on 07/05/2014 for better control.    CBG (last 3)   Recent Labs  07/04/14 1653 07/04/14 2221 07/05/14 0736  GLUCAP 273* 219* 347*     Anemia of chronic disease  Iron panel suggests anemia of chronic disease. TSH and B12 normal .     Diet: Diabetic  DVT prophylaxis: Subcutaneous Lovenox    Consults: [facility], endocrine consult with Dr Cruzita Lederer over the phone by previous physician    Procedure:  Flexible sigmoidoscopy on 5/2 CT abdomen and pelvis EGD on 5/5   Code Status: Full code Family Communication: None at bedside Disposition Plan: Hospital stay prolonged due to ongoing diarrhea, abdominal pain and severely uncontrolled diabetes. No definite date of discharge ( once symptoms better and blood glucose better controlled.   Antibiotics:  Anti-infectives    Start     Dose/Rate Route Frequency Ordered Stop   06/28/14 1130  rifaximin (XIFAXAN) tablet 550 mg     550 mg Oral 3 times daily 06/28/14 1002 07/12/14 0959   06/21/14 1800  [MAR Hold]  metroNIDAZOLE (FLAGYL) tablet 500 mg  Status:  Discontinued     (MAR Hold since 06/24/14 0939)   500 mg Oral 3 times per day 06/21/14 1650 06/24/14 1043   06/20/14 0012  metroNIDAZOLE (FLAGYL) IVPB 500 mg  Status:  Discontinued     500 mg 100 mL/hr over 60 Minutes Intravenous Every 8 hours 06/20/14 0013 06/21/14 0852         Objective: Filed Vitals:   07/05/14 0459  BP: 106/72  Pulse: 100  Temp: 97.9 F (36.6 C)  Resp: 16    Intake/Output Summary (Last 24 hours) at 07/05/14 0905 Last data filed at 07/05/14 1610  Gross per 24 hour  Intake   1320 ml  Output   1950 ml  Net   -630 ml   Filed Weights    06/20/14 0015 06/25/14 0434 06/27/14 0751  Weight: 44 kg (97 lb) 40.7 kg (89 lb 11.6 oz) 43.092 kg (95 lb)    Exam:   General:  Young thin built female in no acute distress  HEENT: moist oral mucosa, supple neck  Chest: Clear to auscultation bilaterally,   CVS: Normal S1 and S2, no murmurs or gallop  GI: Soft, nondistended,  epigastric tenderness  , bowel  sounds present  Musculoskeletal: Warm, no edema     Data Reviewed: Basic Metabolic Panel:  Recent Labs Lab 06/28/14 1520 06/29/14 0745 07/01/14 0950 07/04/14 0830  NA 137  --  140 134*  K 3.9  --  4.1 4.2  CL 103  --  102 95*  CO2 30  --  28 28  GLUCOSE 312* 562* 218* 412*  BUN 19  --  17 17  CREATININE 0.69  --  0.56 0.64  CALCIUM 8.9  --  10.0 9.6  MG  --   --   --  2.0   Liver Function Tests: No results for input(s): AST, ALT, ALKPHOS, BILITOT, PROT, ALBUMIN in the last 168 hours. No results for input(s): LIPASE, AMYLASE in the last 168 hours. No results for input(s): AMMONIA in the last 168 hours. CBC:  Recent Labs Lab 06/29/14 0503 07/04/14 0830  WBC 6.1 6.5  HGB 10.7* 12.1  HCT 32.0* 36.0  MCV 93.6 92.8  PLT 216 290   Cardiac Enzymes: No results for input(s): CKTOTAL, CKMB, CKMBINDEX, TROPONINI in the last 168 hours. BNP (last 3 results) No results for input(s): BNP in the last 8760 hours.  ProBNP (last 3 results) No results for input(s): PROBNP in the last 8760 hours.  CBG:  Recent Labs Lab 07/04/14 0742 07/04/14 1208 07/04/14 1653 07/04/14 2221 07/05/14 0736  GLUCAP 390* 264* 273* 219* 347*    No results found for this or any previous visit (from the past 240 hour(s)).   Studies: No results found.  Scheduled Meds: . amitriptyline  100 mg Oral QHS  . colestipol  5 g Oral BID WC  . cyanocobalamin  500 mcg Oral BID  . dicyclomine  10 mg Oral TID AC & HS  . diphenoxylate-atropine  2 tablet Oral BID  . enoxaparin (LOVENOX) injection  30 mg Subcutaneous Q24H  . feeding  supplement (GLUCERNA SHAKE)  237 mL Oral TID BM  . gabapentin  300 mg Oral QHS  . insulin aspart  0-15 Units Subcutaneous TID WC  . insulin aspart  0-5 Units Subcutaneous QHS  . insulin glargine  13 Units Subcutaneous BID  . ketorolac  1 drop Left Eye QID  . lipase/protease/amylase  24,000 Units Oral TID AC  . loperamide  4 mg Oral TID AC & HS  . multivitamin with minerals  1 tablet Oral q morning - 10a  . ofloxacin  1 drop Left Eye QID  . prednisoLONE acetate  1 drop Left Eye QID  . rifaximin  550 mg Oral TID  . saccharomyces boulardii  250 mg Oral BID   Continuous Infusions:    Time spent: [facility] Pager [phone] If 7PM-7AM, please contact night-coverage at  [email or web address], password [facility] 07/05/2014, 9:05 AM  LOS: 16 days

## 2014-07-05 NOTE — Progress Notes (Signed)
Dear Doctor: [name].  This patient has been identified as a candidate for PICC for the following reason (s): drug pH or osmolality (causing phlebitis, infiltration in 24 hours) and restarts due to phlebitis and infiltration in 24 hours If you agree, please write an order for the indicated device. For any questions contact the Vascular Access Team at [phone] if no answer, please leave a message.  Thank you for supporting the early vascular access assessment program.

## 2014-07-06 LAB — BASIC METABOLIC PANEL
Anion gap: 5 (ref 5–15)
BUN: 20 mg/dL (ref 6–20)
CO2: 28 mmol/L (ref 22–32)
Calcium: 8.9 mg/dL (ref 8.9–10.3)
Chloride: 100 mmol/L — ABNORMAL LOW (ref 101–111)
Creatinine, Ser: 0.53 mg/dL (ref 0.44–1.00)
GFR calc Af Amer: >60 mL/min (ref >60)
GFR calc non Af Amer: >60 mL/min (ref >60)
Glucose, Bld: 409 mg/dL — ABNORMAL HIGH (ref 65–99)
Potassium: 4.2 mmol/L (ref 3.5–5.1)
Sodium: 133 mmol/L — ABNORMAL LOW (ref 135–145)

## 2014-07-06 LAB — GLUCOSE, CAPILLARY
GLUCOSE-CAPILLARY: 274 mg/dL — AB (ref 65–99)
Glucose-Capillary: 445 mg/dL — ABNORMAL HIGH (ref 65–99)

## 2014-07-06 LAB — MAGNESIUM: Magnesium: 1.8 mg/dL (ref 1.7–2.4)

## 2014-07-06 MED ORDER — SACCHAROMYCES BOULARDII 250 MG PO CAPS: 250.0000 mg | ORAL_CAPSULE | Freq: Two times a day (BID) | ORAL | Status: AC

## 2014-07-06 MED ORDER — RIFAXIMIN 550 MG PO TABS: 550.0000 mg | ORAL_TABLET | Freq: Three times a day (TID) | ORAL | Status: DC

## 2014-07-06 MED ORDER — COLESTIPOL HCL 5 G PO PACK: 5.0000 g | PACK | Freq: Two times a day (BID) | ORAL | Status: DC

## 2014-07-06 MED ORDER — LOPERAMIDE HCL 2 MG PO CAPS: 4.0000 mg | ORAL_CAPSULE | Freq: Three times a day (TID) | ORAL | Status: DC

## 2014-07-06 MED ORDER — ONDANSETRON 4 MG PO TBDP: 4.0000 mg | ORAL_TABLET | Freq: Three times a day (TID) | ORAL | Status: DC | PRN

## 2014-07-06 MED ORDER — INSULIN GLARGINE 100 UNIT/ML ~~LOC~~ SOLN
21.0000 [IU] | Freq: Two times a day (BID) | SUBCUTANEOUS | Status: DC
  Administered 2014-07-06: 15 [IU] via SUBCUTANEOUS
  Filled 2014-07-06 (×2): qty 0.21

## 2014-07-06 MED ORDER — GLUCERNA SHAKE PO LIQD: 237.0000 mL | Freq: Three times a day (TID) | ORAL | Status: DC

## 2014-07-06 MED ORDER — DIPHENOXYLATE-ATROPINE 2.5-0.025 MG PO TABS: 2.0000 | ORAL_TABLET | Freq: Two times a day (BID) | ORAL | Status: DC

## 2014-07-06 MED ORDER — OXYCODONE-ACETAMINOPHEN 5-325 MG PO TABS: 1.0000 | ORAL_TABLET | ORAL | Status: DC | PRN

## 2014-07-06 MED ORDER — PANCRELIPASE (LIP-PROT-AMYL) 24000-76000 UNITS PO CPEP: 24000.0000 [IU] | ORAL_CAPSULE | Freq: Three times a day (TID) | ORAL | Status: DC

## 2014-07-06 MED ORDER — PROMETHAZINE HCL 25 MG RE SUPP: 25.0000 mg | Freq: Four times a day (QID) | RECTAL | Status: DC | PRN

## 2014-07-06 NOTE — Progress Notes (Signed)
Pt's bedtime glucose was 278. Per HS sliding scale 3 units of novolog insulin required, pt only wanted 2 units of novolog. She refused to have the 18 units of Lantus ordered.

## 2014-07-06 NOTE — Progress Notes (Signed)
Pt's mother is not happy about the physician changing pain medication regimen. Pt's mother wanted me to get an order for a one time dose of dilaudid. I told her I will notify the night doctor to see what can be done for her pain. Will continue to monitor.

## 2014-07-06 NOTE — Discharge Instructions (Signed)
Follow with Primary MD HASSAN,SAMI, MD in 7 days   Get CBC, CMP, 2 view Chest X ray checked  by Primary MD next visit.    Activity: As tolerated with Full fall precautions use walker/cane & assistance as needed   Disposition Home     Diet: Low Carb  Accuchecks 4 times/day, Once in AM empty stomach and then before each meal. Log in all results and show them to your Prim.MD in 3 days. If any glucose reading is under 80 or above 300 call your Prim MD immidiately. Follow Low glucose instructions for glucose under 80 as instructed.   For Heart failure patients - Check your Weight same time everyday, if you gain over 2 pounds, or you develop in leg swelling, experience more shortness of breath or chest pain, call your Primary MD immediately. Follow Cardiac Low Salt Diet and 1.5 lit/day fluid restriction.   On your next visit with your primary care physician please Get Medicines reviewed and adjusted.   Please request your Prim.MD to go over all Hospital Tests and Procedure/Radiological results at the follow up, please get all Hospital records sent to your Prim MD by signing hospital release before you go home.   If you experience worsening of your admission symptoms, develop shortness of breath, life threatening emergency, suicidal or homicidal thoughts you must seek medical attention immediately by calling 911 or calling your MD immediately  if symptoms less severe.  You Must read complete instructions/literature along with all the possible adverse reactions/side effects for all the Medicines you take and that have been prescribed to you. Take any new Medicines after you have completely understood and accpet all the possible adverse reactions/side effects.   Do not drive, operating heavy machinery, perform activities at heights, swimming or participation in water activities or provide baby sitting services if your were admitted for syncope or siezures until you have seen by Primary MD or a  Neurologist and advised to do so again.  Do not drive when taking Pain medications.    Do not take more than prescribed Pain, Sleep and Anxiety Medications  Special Instructions: If you have smoked or chewed Tobacco  in the last 2 yrs please stop smoking, stop any regular Alcohol  and or any Recreational drug use.  Wear Seat belts while driving.   Please note  You were cared for by a hospitalist during your hospital stay. If you have any questions about your discharge medications or the care you received while you were in the hospital after you are discharged, you can call the unit and asked to speak with the hospitalist on call if the hospitalist that took care of you is not available. Once you are discharged, your primary care physician will handle any further medical issues. Please note that NO REFILLS for any discharge medications will be authorized once you are discharged, as it is imperative that you return to your primary care physician (or establish a relationship with a primary care physician if you do not have one) for your aftercare needs so that they can reassess your need for medications and monitor your lab values.

## 2014-07-06 NOTE — Progress Notes (Signed)
Patient has reported no active nausea and has had no vomiting episodes for over 24 hours. She has still requested IV phenergan q6h because she explains she is so fearful of vomiting. Although, patient has had several meals today and able to keep everything down. Only 31mL of stool output on this shift (8 hours). Stool still soft, but not watery, so difficult to measure in mLs.   Pt ready to d/c home with mom. Pt requested pain medication and IV ativan before leaving. Medication still ordered; medicated patient at her request. AVS reviewed and "My Chart" discussed with pt. Explained all follow-up appointments with patient and listed all medication times for her so she could remain on the same schedule for her anti-diarrheal meds, and pain/nausea meds after d/c. All prescriptions given to patient and all questions answered to patient and mother's satisfaction. Pt stable, in no acute distress, and appears happy about being d/c'd.

## 2014-07-06 NOTE — Discharge Summary (Addendum)
Mandy GIOVANNINI, is a 34 y.o. female  DOB [birth year]  MRN [id number].  Admission date:  06/19/2014  Admitting Physician  Rise Patience, MD  Discharge Date:  07/06/2014   Primary MD  [name], MD  Recommendations for primary care physician for things to follow:   Monitor electrolytes and glycemic control closely. Must follow with endocrinologist and GI physician within a week   Admission Diagnosis  Diarrhea [R19.7] Abdominal pain [R10.9]   Discharge Diagnosis  Diarrhea [R19.7] Abdominal pain [R10.9]    Principal Problem:   Diarrhea Active Problems:   DM (diabetes mellitus), type 1, uncontrolled   Diabetic gastroparesis associated with type 1 diabetes mellitus   Nausea vomiting and diarrhea   Personal history of noncompliance with medical treatment   Abdominal pain   Anemia of chronic disease   Diabetic gastroparesis      Past Medical History  Diagnosis Date  . DM type 1 causing complication dx 9381    hyperglycemia +/- DKA + coma, severe hypoglycemia, gastroparesis, peripheral neuropathy  . Thyroid disease     hypothyroidism associated with pregnancy  . Back pain   . PONV (postoperative nausea and vomiting)   . Anxiety     hx panic attacks.   Marland Kitchen GERD (gastroesophageal reflux disease)   . DM gastroparesis   . Anemia     .Autoimmune hemolytic anemia. bone marrow bx 2008, Dr Marin Olp  . Colitis, Clostridium difficile 05/2014  . Fatty liver 01/2007    with hepatomegaly on CT scan.     Past Surgical History  Procedure Laterality Date  . Laparoscopy  02/2002, 06/2010    laparoscopy with lysis pelvic adhesions for pelvic pain  . Back surgery      age 21  . Tooth extraction    . Root canal  10/10/12  . Flexible sigmoidoscopy N/A 06/24/2014    Procedure: FLEXIBLE SIGMOIDOSCOPY;  Surgeon: Milus Banister, MD;  Location: [facility];  Service: Endoscopy;  Laterality: N/A;  . Esophagogastroduodenoscopy (egd) with propofol N/A 06/27/2014    Procedure: ESOPHAGOGASTRODUODENOSCOPY (EGD) WITH PROPOFOL;  Surgeon: Milus Banister, MD;  Location: [facility];  Service: Endoscopy;  Laterality: N/A;       History of present illness and  Hospital Course:     Kindly see H&P for history of present illness and admission details, please review complete Labs, Consult reports and Test reports for all details in brief  HPI  34 year old female with history of uncontrolled type 1 diabetes mellitus, recent hospitalization for DKA thought to be secondary to C. difficile returned to the ED with 24-hour history of nausea, vomiting, abdominal pain with diarrhea. CT of the abdomen and pelvis showed a resolved inflammatory changes around sigmoid colon and rectum with some residual thickening throughout distal sigmoid colon and rectum suggestive residual colitis. Patient seen by GI and underwent flexible sigmoidoscopy on 5/2 for persistent diarrhea which was negative for pseudomembranous colitis. Biopsies negative. EGD done which was unremarkable. Hospital course prolonged due to ongoing diarrhea and uncontrolled blood  glucose.    Hospital Course      Diarrhea with abdominal pain  -So far EGD, flexible sigmoidoscopy unremarkable, C. difficile PCR negative. Celiac sprue workup negative. She recently was treated for C. difficile colitis a few weeks ago.   Her symptoms were quite puzzling, she was claiming to have 10-12 bowel movements minimum on a daily basis with at least 2-1/2-3 L of stool output per measurement off stool left in the hat. She was seen by GI, underwent EGD and flexible sigmoidoscopy which were both unremarkable. Multiple stool studies including one for C. difficile PCR and stool cultures were negative.  Her studies for laxative abuse, stool electrolytes and 24-hour urine for 5 HIAA ordered by GI  are pending she will follow with GI physician Dr. Carlean Purl for final results.  There was suspicion of patient missed stating the facts, finally she was requested to get a rectal tube placed on 07/05/2014 morning for correct stool output measurement. Rectal tube was ordered 7 AM in the morning finally placed 1 PM as patient kept on refusing, finally received the rectal tube at 1 PM but removed it at 11 PM stating it was uncomfortable. Stool output that day from 7 AM yesterday morning (prior to 2 placement) with close monitoring until 11 PM total rectal tube was removed was 700 mL at the most in 16 hours and this too was semi-formed stool. This was not watery diarrhea that she was claiming. This was highly inconsistent with what patient was claiming to be happening merely few hours prior.  Off note I had kept her off IV fluids and any additional electrolytes for the last 4 days, her electrolytes remained essentially unremarkable despite her claiming to be losing close to 3 L of stool with 10-12 bowel movements a day.  This morning I discussed the case in detail with patient and her mother, patient claims that her diarrhea was extremely painful and she was demanding IV Dilaudid throughout her hospital stay. Her presentation is somewhat puzzling. After detailed expiration of above patient and mother are agreeable for home discharge with close outpatient GI follow-up. I will give her antidiarrheal medications which were being used here and some oral narcotics along with anti-emitics.    Her exam throughout the stay has been essentially benign, whenever I have observed her she has been in no distress, nursing evaluation and notes suggest the same that she demands IV Dilaudid and IV narcotics despite appearing completely comfortable. She has been consuming large amounts of food throughout her hospital stay under my evaluation.  For follow with GI for final evaluation and diagnosis of her  diarrhea.     Uncontrolled type 1 diabetes mellitus with ? gastroparesis Last A1c of 11.8. Sees Dr. Cruzita Lederer. Try to adjust her insulin multiple times but she would refuse, she only takes insulin according to her own scale and dosage. Glycemic control has been poor but despite counseling she would not agree for any adjustments whatsoever. Again I discussed this with her mother. Her glycemic control is essential for her control of gastroparesis and secretory diarrhea if this proves to be secondary to diabetes.   Anemia of chronic disease Iron panel suggests anemia of chronic disease. TSH and B12 normal .          Discharge Condition: Stable   Follow UP  Follow-up Information    Follow up with [facility], MD. Schedule an appointment as soon as possible for a visit in 3 days.   Specialty:  Internal Medicine  Contact information:   [address] [place] [address] [phone]       Follow up with Philemon Kingdom, MD. Schedule an appointment as soon as possible for a visit in 1 week.   Specialty:  Internal Medicine   Contact information:   [address] [place] [address] [phone]       Follow up with Silvano Rusk, MD. Schedule an appointment as soon as possible for a visit in 1 week.   Specialty:  Gastroenterology   Contact information:   [address] [address] [phone]         Discharge Instructions  and  Discharge Medications      Discharge Instructions    Discharge instructions    Complete by:  As directed   Follow with Primary MD HASSAN,SAMI, MD in 7 days   Get CBC, CMP, 2 view Chest X ray checked  by Primary MD next visit.    Activity: As tolerated with Full fall precautions use walker/cane & assistance as needed   Disposition Home     Diet: Low Carb  Accuchecks 4 times/day, Once in AM empty stomach and then before each meal. Log in all results and show them to your Prim.MD in 3  days. If any glucose reading is under 80 or above 300 call your Prim MD immidiately. Follow Low glucose instructions for glucose under 80 as instructed.   For Heart failure patients - Check your Weight same time everyday, if you gain over 2 pounds, or you develop in leg swelling, experience more shortness of breath or chest pain, call your Primary MD immediately. Follow Cardiac Low Salt Diet and 1.5 lit/day fluid restriction.   On your next visit with your primary care physician please Get Medicines reviewed and adjusted.   Please request your Prim.MD to go over all Hospital Tests and Procedure/Radiological results at the follow up, please get all Hospital records sent to your Prim MD by signing hospital release before you go home.   If you experience worsening of your admission symptoms, develop shortness of breath, life threatening emergency, suicidal or homicidal thoughts you must seek medical attention immediately by calling 911 or calling your MD immediately  if symptoms less severe.  You Must read complete instructions/literature along with all the possible adverse reactions/side effects for all the Medicines you take and that have been prescribed to you. Take any new Medicines after you have completely understood and accpet all the possible adverse reactions/side effects.   Do not drive, operating heavy machinery, perform activities at heights, swimming or participation in water activities or provide baby sitting services if your were admitted for syncope or siezures until you have seen by Primary MD or a Neurologist and advised to do so again.  Do not drive when taking Pain medications.    Do not take more than prescribed Pain, Sleep and Anxiety Medications  Special Instructions: If you have smoked or chewed Tobacco  in the last 2 yrs please stop smoking, stop any regular Alcohol  and or any Recreational drug use.  Wear Seat belts while driving.   Please note  You were cared  for by a hospitalist during your hospital stay. If you have any questions about your discharge medications or the care you received while you were in the hospital after you are discharged, you can call the unit and asked to speak with the hospitalist on call if the hospitalist that took care of  you is not available. Once you are discharged, your primary care physician will handle any further medical issues. Please note that NO REFILLS for any discharge medications will be authorized once you are discharged, as it is imperative that you return to your primary care physician (or establish a relationship with a primary care physician if you do not have one) for your aftercare needs so that they can reassess your need for medications and monitor your lab values.     Increase activity slowly    Complete by:  As directed             Medication List    STOP taking these medications        butalbital-acetaminophen-caffeine 50-325-40 MG per tablet  Commonly known as:  FIORICET     metroNIDAZOLE 500 MG tablet  Commonly known as:  FLAGYL      TAKE these medications        acetaminophen 500 MG tablet  Commonly known as:  TYLENOL  Take 500 mg by mouth every 6 (six) hours as needed for moderate pain or headache (headache and stomach pain).     amitriptyline 50 MG tablet  Commonly known as:  ELAVIL  Take 100 mg by mouth at bedtime.     colestipol 5 G packet  Commonly known as:  COLESTID  Take 5 g by mouth 2 (two) times daily with a meal.     cyanocobalamin 500 MCG tablet  Take 500 mcg by mouth 2 (two) times daily.     dicyclomine 10 MG capsule  Commonly known as:  BENTYL  Take 10 mg by mouth 4 (four) times daily -  before meals and at bedtime.     diphenoxylate-atropine 2.5-0.025 MG per tablet  Commonly known as:  LOMOTIL  Take 2 tablets by mouth 2 (two) times daily.     feeding supplement (GLUCERNA SHAKE) Liqd  Take 237 mLs by mouth 3 (three) times daily between meals.     gabapentin  100 MG capsule  Commonly known as:  NEURONTIN  Take 300 mg by mouth at bedtime.     glucagon 1 MG injection  Commonly known as:  GLUCAGON EMERGENCY  Inject 1 mg into the muscle once as needed (severe hypoglycemia).     insulin aspart 100 UNIT/ML injection  Commonly known as:  novoLOG  - Inject 0-20 Units into the skin 3 (three) times daily with meals. CBG < 70: implement hypoglycemia protocol  - CBG > 400: call MD   -   - CBG 70 - 120: 0 units  - CBG 121 - 150: 3 units  - CBG 151 - 200: 4 units  - CBG 201 - 250: 7 units  - CBG 251 - 300: 11 units  - CBG 301 - 350: 15 units  - CBG 351 - 400: 20 units  - CBG > 400: call MD     insulin glargine 100 UNIT/ML injection  Commonly known as:  LANTUS  Inject 0.15 mLs (15 Units total) into the skin every morning.     ketorolac 0.4 % Soln  Commonly known as:  ACULAR  Place 1 drop into the left eye 4 (four) times daily. For 2 weeks post op (surgery on 4/18)     loperamide 2 MG capsule  Commonly known as:  IMODIUM  Take 2 capsules (4 mg total) by mouth 4 (four) times daily -  before meals and at bedtime.     multivitamin with minerals Tabs tablet  Take 1 tablet by mouth every morning. Diabetic support vitamin     ofloxacin 0.3 % ophthalmic solution  Commonly known as:  OCUFLOX  1 drop 4 (four) times daily. For 1 week post op (surgery 4/18)     ondansetron 4 MG disintegrating tablet  Commonly known as:  ZOFRAN ODT  Take 1 tablet (4 mg total) by mouth every 8 (eight) hours as needed for nausea or vomiting.     oxyCODONE-acetaminophen 5-325 MG per tablet  Commonly known as:  PERCOCET  Take 1 tablet by mouth every 4 (four) hours as needed for severe pain.     Pancrelipase (Lip-Prot-Amyl) 24000 UNITS Cpep  Take 1 capsule (24,000 Units total) by mouth 3 (three) times daily before meals.     prednisoLONE acetate 1 % ophthalmic suspension  Commonly known as:  PRED FORTE  Place 1 drop into the left eye 4 (four) times daily.      promethazine 25 MG suppository  Commonly known as:  PHENERGAN  Place 1 suppository (25 mg total) rectally every 6 (six) hours as needed for nausea or vomiting.     rifaximin 550 MG Tabs tablet  Commonly known as:  XIFAXAN  Take 1 tablet (550 mg total) by mouth 3 (three) times daily.     saccharomyces boulardii 250 MG capsule  Commonly known as:  FLORASTOR  Take 1 capsule (250 mg total) by mouth 2 (two) times daily.          Diet and Activity recommendation: See Discharge Instructions above   Consults obtained - Consults: [facility], endocrine consult with Dr Cruzita Lederer over the phone by previous physician    Major procedures and Radiology Reports - PLEASE review detailed and final reports for all details, in brief -      Procedure:   Flexible sigmoidoscopy on 5/2 - no ulceration or inflammation  CT abdomen and pelvis - minimal sigmoid colitis (recent history of C. difficile colitis)  EGD on 5/5 - some evidence of gastroparesis    Ct Abdomen Pelvis W Contrast  06/21/2014   CLINICAL DATA:  34 year old female with diarrhea, weight loss and gastric paresis. History of C difficile colitis.  EXAM: CT ABDOMEN AND PELVIS WITH CONTRAST  TECHNIQUE: Multidetector CT imaging of the abdomen and pelvis was performed using the standard protocol following bolus administration of intravenous contrast.  CONTRAST:  76mL OMNIPAQUE IOHEXOL 300 MG/ML  SOLN  COMPARISON:  CT of the abdomen and pelvis 05/30/2014.  FINDINGS: Lower chest:  Unremarkable.  Hepatobiliary: No cystic or solid hepatic lesions. No intra or extrahepatic biliary ductal dilatation. Gallbladder is normal in appearance.  Pancreas: Unremarkable.  Spleen: Several small splenic granulomas are noted.  Adrenals/Urinary Tract: Bilateral adrenal glands and bilateral kidneys are normal in appearance. No hydroureteronephrosis. Urinary bladder is normal in appearance.  Stomach/Bowel: Normal appearance of the stomach. No pathologic  dilatation of small bowel or colon. There continues to be some mural thickening in the sigmoid colon and rectum, however, the surrounding haziness in the adjacent mesorectal and mesocolic fat noted on the prior study has largely resolved. The more proximal aspects of the colon are otherwise normal in appearance. Normal appendix.  Vascular/Lymphatic: No atherosclerotic disease or aneurysm identified in the abdominal or pelvic vasculature. No lymphadenopathy noted in the abdomen or pelvis.  Reproductive: Uterus and ovaries are unremarkable in appearance.  Other: No significant volume of ascites.  No pneumoperitoneum.  Musculoskeletal: There are no aggressive appearing lytic or blastic lesions noted in the visualized portions  of the skeleton.  IMPRESSION: 1. Although the inflammatory changes around the sigmoid colon and rectum have largely resolved compared to the prior study, there appears to be some residual mural thickening throughout the distal sigmoid colon and rectum, which may indicate some residual colitis. This appears improved. The remainder the colon is otherwise normal in appearance.   Electronically Signed   By: Vinnie Langton M.D.   On: 06/21/2014 14:37   Dg Abd Acute W/chest  06/20/2014   CLINICAL DATA:  Acute onset of nausea, vomiting and diarrhea. Generalized abdominal pain and weakness. Initial encounter.  EXAM: DG ABDOMEN ACUTE W/ 1V CHEST  COMPARISON:  Chest radiograph performed 03/03/2014, and CT of the abdomen and pelvis from 05/30/2014  FINDINGS: The lungs are well-aerated and clear. There is no evidence of focal opacification, pleural effusion or pneumothorax. The cardiomediastinal silhouette is within normal limits. Bilateral metallic nipple piercings are noted. Clips are seen overlying the lung apices bilaterally.  The visualized bowel gas pattern is unremarkable. Scattered stool and air are seen within the colon; there is no evidence of small bowel dilatation to suggest obstruction. No  free intra-abdominal air is identified on the provided upright view.  No acute osseous abnormalities are seen; the sacroiliac joints are unremarkable in appearance.  IMPRESSION: 1. Unremarkable bowel gas pattern; no free intra-abdominal air seen. Small amount of stool noted in the colon. 2. No acute cardiopulmonary process seen.   Electronically Signed   By: Garald Balding M.D.   On: 06/20/2014 00:00   US Abdomen Limited Ruq  06/20/2014   CLINICAL DATA:  Abdominal pain, right upper quadrant with nausea and vomiting and transaminitis.  EXAM: US ABDOMEN LIMITED - RIGHT UPPER QUADRANT  COMPARISON:  05/30/2014 abdominal CT  FINDINGS: Gallbladder:  No gallstones or wall thickening visualized. No sonographic Murphy sign noted.  Common bile duct:  Diameter: 3 mm  Liver:  No focal lesion identified. Within normal limits in parenchymal echogenicity. Antegrade flow in the imaged portal venous system.  IMPRESSION: Normal right upper quadrant ultrasound.   Electronically Signed   By: Monte Fantasia M.D.   On: 06/20/2014 19:50    Micro Results      No results found for this or any previous visit (from the past 240 hour(s)).     Today   Subjective:   Camyla Camposano today has no headache,no chest abdominal pain,no new weakness tingling or numbness, feels much better wants to go home today.    Objective:   Blood pressure 106/60, pulse 90, temperature 98.5 F (36.9 C), temperature source Oral, resp. rate 16, height 5\' 5"  (1.651 m), weight 44.5 kg (98 lb 1.7 oz), last menstrual period 01/26/2014, SpO2 95 %.   Intake/Output Summary (Last 24 hours) at 07/06/14 1046 Last data filed at 07/06/14 0729  Gross per 24 hour  Intake    480 ml  Output   3580 ml  Net  -3100 ml    Exam Awake Alert, Oriented x 3, No new F.N deficits, Normal affect [place].AT,PERRAL Supple Neck,No JVD, No cervical lymphadenopathy appriciated.  Symmetrical Chest wall movement, Good air movement bilaterally, CTAB RRR,No Gallops,Rubs  or new Murmurs, No Parasternal Heave +ve B.Sounds, Abd Soft, Non tender, No organomegaly appriciated, No rebound -guarding or rigidity. No Cyanosis, Clubbing or edema, No new Rash or bruise  Data Review   CBC w Diff: Lab Results  Component Value Date   WBC 6.5 07/04/2014   WBC 9.2 08/18/2012   WBC 8.1 02/27/2007   HGB  12.1 07/04/2014   HGB 12.7 02/27/2007   HCT 36.0 07/04/2014   HCT 36.2 02/27/2007   PLT 290 07/04/2014   PLT 236 02/27/2007   LYMPHOPCT 35 06/20/2014   LYMPHOPCT 23.1 02/27/2007   MONOPCT 9 06/20/2014   MONOPCT 5.0 02/27/2007   EOSPCT 1 06/20/2014   EOSPCT 0.0 02/27/2007   BASOPCT 0 06/20/2014   BASOPCT 0.2 02/27/2007    CMP: Lab Results  Component Value Date   NA 133* 07/06/2014   NA 121* 08/18/2012   K 4.2 07/06/2014   CL 100* 07/06/2014   CO2 28 07/06/2014   BUN 20 07/06/2014   BUN 22* 08/18/2012   CREATININE 0.53 07/06/2014   CREATININE 1.0 08/18/2012   GLU 617 08/18/2012   PROT 7.0 06/26/2014   ALBUMIN 4.1 06/26/2014   BILITOT 0.5 06/26/2014   ALKPHOS 57 06/26/2014   AST 47* 06/26/2014   ALT 48 06/26/2014  .   Total Time in preparing paper work, data evaluation and todays exam - 35 minutes  Thurnell Lose M.D on 07/06/2014 at 10:46 AM  [facility]   Office  [phone]

## 2014-07-06 NOTE — Progress Notes (Signed)
Pt refused to have rectal tube any longer because she stated that it was very painful. I informed pt of the reasons rectal tube was ordered. She still wanted it out. Rectal tube discontinued. Notified NP [facility].

## 2014-07-06 NOTE — Progress Notes (Signed)
Pt's BS was 445 this morning. Patient has consistently refused to take ordered sliding scale and meal coverage since admission. MD will modify insulin to accommodate blood sugar levels and patient will say that it is too much for her. Patient will only allow RN to give her 7 units of Novolog this morning for her 445 blood sugar level. MD notified.

## 2014-07-11 LAB — 5 HIAA, QUANTITATIVE, URINE, 24 HOUR
5-HIAA, Ur: 3 mg/L
5-HIAA,Quant.,24 Hr Urine: 7.5 mg/24 hr (ref 0.0–14.9)
TOTAL VOLUME: 2500

## 2014-07-12 LAB — VASOACTIVE INTESTINAL PEPTIDE (VIP): Vasoactive Intest Polypeptide: <50 pg/mL (ref <75)

## 2014-07-17 ENCOUNTER — Telehealth: Payer: Self-pay | Admitting: Nurse Practitioner

## 2014-07-17 NOTE — Telephone Encounter (Signed)
-----   Message from Willia Craze, NP sent at 07/17/2014  1:21 PM EDT ----- Thanks, will you convert this to a phone note so we have a record. Thanks ----- Message -----    From: Kathline Magic    Sent: 07/17/2014   1:15 PM      To: Willia Craze, NP  Wynelle Bourgeois, I called this patient, but she said she couldn't schedule at that moment. She said she would call back to schedule an appointment.

## 2014-07-18 ENCOUNTER — Emergency Department (HOSPITAL_COMMUNITY)
Admission: EM | Admit: 2014-07-18 | Discharge: 2014-07-18 | Disposition: A | Discharge status: Home / Self Care | Payer: Medicaid Other | Attending: Emergency Medicine | Admitting: Emergency Medicine

## 2014-07-18 ENCOUNTER — Encounter (HOSPITAL_COMMUNITY): Payer: Self-pay | Admitting: Emergency Medicine

## 2014-07-18 DIAGNOSIS — Z87891 Personal history of nicotine dependence: Secondary | ICD-10-CM | POA: Insufficient documentation

## 2014-07-18 DIAGNOSIS — Z794 Long term (current) use of insulin: Secondary | ICD-10-CM | POA: Insufficient documentation

## 2014-07-18 DIAGNOSIS — Z862 Personal history of diseases of the blood and blood-forming organs and certain disorders involving the immune mechanism: Secondary | ICD-10-CM | POA: Diagnosis not present

## 2014-07-18 DIAGNOSIS — Z7952 Long term (current) use of systemic steroids: Secondary | ICD-10-CM | POA: Diagnosis not present

## 2014-07-18 DIAGNOSIS — Z792 Long term (current) use of antibiotics: Secondary | ICD-10-CM | POA: Diagnosis not present

## 2014-07-18 DIAGNOSIS — F419 Anxiety disorder, unspecified: Secondary | ICD-10-CM | POA: Diagnosis not present

## 2014-07-18 DIAGNOSIS — K219 Gastro-esophageal reflux disease without esophagitis: Secondary | ICD-10-CM | POA: Diagnosis not present

## 2014-07-18 DIAGNOSIS — R739 Hyperglycemia, unspecified: Secondary | ICD-10-CM

## 2014-07-18 DIAGNOSIS — Z3202 Encounter for pregnancy test, result negative: Secondary | ICD-10-CM | POA: Diagnosis not present

## 2014-07-18 DIAGNOSIS — Z8619 Personal history of other infectious and parasitic diseases: Secondary | ICD-10-CM | POA: Diagnosis not present

## 2014-07-18 DIAGNOSIS — R109 Unspecified abdominal pain: Secondary | ICD-10-CM | POA: Diagnosis present

## 2014-07-18 DIAGNOSIS — Z91040 Latex allergy status: Secondary | ICD-10-CM | POA: Diagnosis not present

## 2014-07-18 DIAGNOSIS — Z79899 Other long term (current) drug therapy: Secondary | ICD-10-CM | POA: Insufficient documentation

## 2014-07-18 DIAGNOSIS — E1065 Type 1 diabetes mellitus with hyperglycemia: Secondary | ICD-10-CM | POA: Diagnosis not present

## 2014-07-18 LAB — URINE MICROSCOPIC-ADD ON

## 2014-07-18 LAB — CBC WITH DIFFERENTIAL/PLATELET
BASOS ABS: 0 10*3/uL (ref 0.0–0.1)
Basophils Relative: 0 % (ref 0–1)
Eosinophils Absolute: 0.1 10*3/uL (ref 0.0–0.7)
Eosinophils Relative: 1 % (ref 0–5)
HCT: 40.6 % (ref 36.0–46.0)
Hemoglobin: 13.3 g/dL (ref 12.0–15.0)
LYMPHS ABS: 2.1 10*3/uL (ref 0.7–4.0)
LYMPHS PCT: 33 % (ref 12–46)
MCH: 30 pg (ref 26.0–34.0)
MCHC: 32.8 g/dL (ref 30.0–36.0)
MCV: 91.4 fL (ref 78.0–100.0)
MONO ABS: 0.3 10*3/uL (ref 0.1–1.0)
Monocytes Relative: 5 % (ref 3–12)
NEUTROS PCT: 61 % (ref 43–77)
Neutro Abs: 3.9 10*3/uL (ref 1.7–7.7)
PLATELETS: 356 10*3/uL (ref 150–400)
RBC: 4.44 MIL/uL (ref 3.87–5.11)
RDW: 12.7 % (ref 11.5–15.5)
WBC: 6.4 10*3/uL (ref 4.0–10.5)

## 2014-07-18 LAB — COMPREHENSIVE METABOLIC PANEL
ALK PHOS: 68 U/L (ref 38–126)
ALT: 28 U/L (ref 14–54)
AST: 36 U/L (ref 15–41)
Albumin: 4.9 g/dL (ref 3.5–5.0)
Anion gap: 10 (ref 5–15)
BILIRUBIN TOTAL: 0.8 mg/dL (ref 0.3–1.2)
BUN: 24 mg/dL — ABNORMAL HIGH (ref 6–20)
CALCIUM: 9.3 mg/dL (ref 8.9–10.3)
CO2: 24 mmol/L (ref 22–32)
Chloride: 100 mmol/L — ABNORMAL LOW (ref 101–111)
Creatinine, Ser: 0.8 mg/dL (ref 0.44–1.00)
GFR calc Af Amer: >60 mL/min (ref >60)
GFR calc non Af Amer: >60 mL/min (ref >60)
Glucose, Bld: 404 mg/dL — ABNORMAL HIGH (ref 65–99)
POTASSIUM: 5.3 mmol/L — AB (ref 3.5–5.1)
Sodium: 134 mmol/L — ABNORMAL LOW (ref 135–145)
TOTAL PROTEIN: 8 g/dL (ref 6.5–8.1)

## 2014-07-18 LAB — CBG MONITORING, ED: GLUCOSE-CAPILLARY: 369 mg/dL — AB (ref 65–99)

## 2014-07-18 LAB — URINALYSIS, ROUTINE W REFLEX MICROSCOPIC
BILIRUBIN URINE: NEGATIVE
Glucose, UA: >1000 mg/dL — AB
HGB URINE DIPSTICK: NEGATIVE
Ketones, ur: NEGATIVE mg/dL
Leukocytes, UA: NEGATIVE
Nitrite: NEGATIVE
Protein, ur: NEGATIVE mg/dL
Specific Gravity, Urine: 1.038 — ABNORMAL HIGH (ref 1.005–1.030)
Urobilinogen, UA: 0.2 mg/dL (ref 0.0–1.0)
pH: 6 (ref 5.0–8.0)

## 2014-07-18 LAB — LIPASE, BLOOD

## 2014-07-18 LAB — POC URINE PREG, ED: Preg Test, Ur: NEGATIVE

## 2014-07-18 MED ORDER — SODIUM CHLORIDE 0.9 % IV BOLUS (SEPSIS)
2000.0000 mL | Freq: Once | INTRAVENOUS | Status: AC
  Administered 2014-07-18: 2000 mL via INTRAVENOUS

## 2014-07-18 MED ORDER — HYDROMORPHONE HCL 1 MG/ML IJ SOLN
1.0000 mg | Freq: Once | INTRAMUSCULAR | Status: AC
Start: 2014-07-18 — End: 2014-07-18
  Administered 2014-07-18: 1 mg via INTRAVENOUS
  Filled 2014-07-18: qty 1

## 2014-07-18 MED ORDER — HYDROMORPHONE HCL 1 MG/ML IJ SOLN
1.0000 mg | Freq: Once | INTRAMUSCULAR | Status: AC
  Administered 2014-07-18: 1 mg via INTRAVENOUS
  Filled 2014-07-18: qty 1

## 2014-07-18 MED ORDER — METOCLOPRAMIDE HCL 5 MG/ML IJ SOLN
10.0000 mg | Freq: Once | INTRAMUSCULAR | Status: AC
  Administered 2014-07-18: 10 mg via INTRAVENOUS
  Filled 2014-07-18: qty 2

## 2014-07-18 MED ORDER — ONDANSETRON HCL 4 MG/2ML IJ SOLN
4.0000 mg | Freq: Once | INTRAMUSCULAR | Status: AC
  Administered 2014-07-18: 4 mg via INTRAVENOUS
  Filled 2014-07-18: qty 2

## 2014-07-18 NOTE — ED Notes (Signed)
RN STARTING IV AND DRAWING LABS PER RN

## 2014-07-18 NOTE — ED Notes (Signed)
Pt attempting urine sample at present time; will assess with pt return.

## 2014-07-18 NOTE — ED Notes (Signed)
Pt with recent Hx of DKA, gastroparesis, and C. Diff infection c/o generalized abdominal pain, diarrhea, nausea onset last night. Pt denies emesis.

## 2014-07-18 NOTE — ED Provider Notes (Signed)
CSN: [id number]     Arrival date & time 07/18/14  1019 History   First MD Initiated Contact with Patient 07/18/14 1035     Chief Complaint  Patient presents with  . Abdominal Pain      HPI Patient has a long-standing history of recurrent nausea vomiting and diarrhea as well as hyperglycemia.  She is a type I diabetic.  She is following with her primary care physician as well as her endocrinologist.  Her symptoms have been persistent over the past 6 months.  At time she gets flare of her pain and flare for nausea which is what brought her to the emergency department today.  There is not really been any change in her situation just the severity of her pain and nausea has increased to the point where her antinausea medicine at home and her pain medication at home has not been controlling her symptoms over the past 48 hours.  She reports decreased oral intake secondary to the nausea.  She's concerned about the possibility of DKA as she states she is a brittle diabetic.  She also is concerned about her hydration status is requesting IV hydration.  She reports generalized abdominal discomfort and pain.  No fevers or chills.  No other complaints.  She's been going between physicians at [place], [facility], [place] without any clear answers.  She is considering transferring some of her care to a gastroenterologist in [place].   Past Medical History  Diagnosis Date  . DM type 1 causing complication dx 4098    hyperglycemia +/- DKA + coma, severe hypoglycemia, gastroparesis, peripheral neuropathy  . Thyroid disease     hypothyroidism associated with pregnancy  . Back pain   . PONV (postoperative nausea and vomiting)   . Anxiety     hx panic attacks.   Marland Kitchen GERD (gastroesophageal reflux disease)   . DM gastroparesis   . Anemia     .Autoimmune hemolytic anemia. bone marrow bx 2008, Dr Marin Olp  . Colitis, Clostridium difficile 05/2014  . Fatty liver 01/2007    with hepatomegaly on CT scan.    Past  Surgical History  Procedure Laterality Date  . Laparoscopy  02/2002, 06/2010    laparoscopy with lysis pelvic adhesions for pelvic pain  . Back surgery      age 35  . Tooth extraction    . Root canal  10/10/12  . Flexible sigmoidoscopy N/A 06/24/2014    Procedure: FLEXIBLE SIGMOIDOSCOPY;  Surgeon: Milus Banister, MD;  Location: [facility];  Service: Endoscopy;  Laterality: N/A;  . Esophagogastroduodenoscopy (egd) with propofol N/A 06/27/2014    Procedure: ESOPHAGOGASTRODUODENOSCOPY (EGD) WITH PROPOFOL;  Surgeon: Milus Banister, MD;  Location: [facility];  Service: Endoscopy;  Laterality: N/A;   Family History  Problem Relation Age of Onset  . Diabetes Maternal Grandmother   . Cancer Maternal Grandfather     Small cell lung cancer  . Cancer Maternal Grandmother     Leukemia   History  Substance Use Topics  . Smoking status: Former Smoker    Quit date: 04/19/2000  . Smokeless tobacco: Former Systems developer  . Alcohol Use: Yes     Comment: RARE SIPS OF WINE   OB History    No data available     Review of Systems  All other systems reviewed and are negative.     Allergies  Scallops; Morphine and related; Latex; and Other  Home Medications   Prior to Admission medications   Medication Sig Start Date  End Date Taking? Authorizing Provider  acetaminophen (TYLENOL) 500 MG tablet Take 500 mg by mouth every 6 (six) hours as needed for moderate pain or headache (headache and stomach pain).     Historical Provider, MD  amitriptyline (ELAVIL) 50 MG tablet Take 100 mg by mouth at bedtime.     Historical Provider, MD  colestipol (COLESTID) 5 G packet Take 5 g by mouth 2 (two) times daily with a meal. 07/06/14   Thurnell Lose, MD  cyanocobalamin 500 MCG tablet Take 500 mcg by mouth 2 (two) times daily.    Historical Provider, MD  dicyclomine (BENTYL) 10 MG capsule Take 10 mg by mouth 4 (four) times daily -  before meals and at bedtime.    Historical Provider, MD  diphenoxylate-atropine  (LOMOTIL) 2.5-0.025 MG per tablet Take 2 tablets by mouth 2 (two) times daily. 07/06/14   Thurnell Lose, MD  feeding supplement, GLUCERNA SHAKE, (GLUCERNA SHAKE) LIQD Take 237 mLs by mouth 3 (three) times daily between meals. 07/06/14   Thurnell Lose, MD  gabapentin (NEURONTIN) 100 MG capsule Take 300 mg by mouth at bedtime.     Historical Provider, MD  glucagon (GLUCAGON EMERGENCY) 1 MG injection Inject 1 mg into the muscle once as needed (severe hypoglycemia). 02/19/14   Philemon Kingdom, MD  insulin aspart (NOVOLOG) 100 UNIT/ML injection Inject 0-20 Units into the skin 3 (three) times daily with meals. CBG < 70: implement hypoglycemia protocol CBG > 400: call MD   CBG 70 - 120: 0 units CBG 121 - 150: 3 units CBG 151 - 200: 4 units CBG 201 - 250: 7 units CBG 251 - 300: 11 units CBG 301 - 350: 15 units CBG 351 - 400: 20 units CBG > 400: call MD 02/01/13   Donita Brooks, NP  insulin glargine (LANTUS) 100 UNIT/ML injection Inject 0.15 mLs (15 Units total) into the skin every morning. 02/19/14   Philemon Kingdom, MD  ketorolac (ACULAR) 0.4 % SOLN Place 1 drop into the left eye 4 (four) times daily. For 2 weeks post op (surgery on 4/18) 06/04/14   Historical Provider, MD  lipase/protease/amylase 24000 UNITS CPEP Take 1 capsule (24,000 Units total) by mouth 3 (three) times daily before meals. 07/06/14   Thurnell Lose, MD  loperamide (IMODIUM) 2 MG capsule Take 2 capsules (4 mg total) by mouth 4 (four) times daily -  before meals and at bedtime. 07/06/14   Thurnell Lose, MD  Multiple Vitamin (MULTIVITAMIN WITH MINERALS) TABS Take 1 tablet by mouth every morning. Diabetic support vitamin    Historical Provider, MD  ofloxacin (OCUFLOX) 0.3 % ophthalmic solution 1 drop 4 (four) times daily. For 1 week post op (surgery 4/18)    Historical Provider, MD  ondansetron (ZOFRAN ODT) 4 MG disintegrating tablet Take 1 tablet (4 mg total) by mouth every 8 (eight) hours as needed for nausea or vomiting.  07/06/14   Thurnell Lose, MD  oxyCODONE-acetaminophen (PERCOCET) 5-325 MG per tablet Take 1 tablet by mouth every 4 (four) hours as needed for severe pain. 07/06/14   Thurnell Lose, MD  prednisoLONE acetate (PRED FORTE) 1 % ophthalmic suspension Place 1 drop into the left eye 4 (four) times daily. 06/04/14   Historical Provider, MD  promethazine (PHENERGAN) 25 MG suppository Place 1 suppository (25 mg total) rectally every 6 (six) hours as needed for nausea or vomiting. 07/06/14   Thurnell Lose, MD  rifaximin (XIFAXAN) 550 MG TABS tablet Take 1  tablet (550 mg total) by mouth 3 (three) times daily. 07/06/14   Thurnell Lose, MD  saccharomyces boulardii (FLORASTOR) 250 MG capsule Take 1 capsule (250 mg total) by mouth 2 (two) times daily. 07/06/14   Thurnell Lose, MD   Pulse 82  Temp(Src) 98.1 F (36.7 C) (Oral)  Resp 18  SpO2 100% Physical Exam  Constitutional: She is oriented to person, place, and time. She appears well-developed and well-nourished. No distress.  HENT:  Head: Normocephalic and atraumatic.  Eyes: EOM are normal.  Neck: Normal range of motion.  Cardiovascular: Normal rate, regular rhythm and normal heart sounds.   Pulmonary/Chest: Effort normal and breath sounds normal.  Abdominal: Soft. She exhibits no distension. There is no tenderness.  Musculoskeletal: Normal range of motion.  Neurological: She is alert and oriented to person, place, and time.  Skin: Skin is warm and dry.  Psychiatric: She has a normal mood and affect. Judgment normal.  Nursing note and vitals reviewed.   ED Course  Procedures (including critical care time) Labs Review Labs Reviewed  CBG MONITORING, ED - Abnormal; Notable for the following:    Glucose-Capillary 369 (*)    All other components within normal limits  CBC WITH DIFFERENTIAL/PLATELET  COMPREHENSIVE METABOLIC PANEL  LIPASE, BLOOD  URINALYSIS, ROUTINE W REFLEX MICROSCOPIC (NOT AT [facility])  POC URINE PREG, ED    Imaging  Review No results found.   EKG Interpretation None      MDM   Final diagnoses:  None    Symptom control emergency department.  No life-threatening emergency.  Anion gap is 10.  No signs of DKA.  Patient feels much better after antinausea medicine, pain medicine, IV fluids.  I recommended that the patient really focus her care with 1 health group and a primary care physician and 1 or 2 specialists only.  I told her that moving around from clinic to clinic and hospital system the hospital system is not advantageous.  She and her mother understand this.  Vas that she return to the emergency department here at [facility] for any new or worsening symptoms.    Jola Schmidt, MD 07/18/14 [phone]

## 2014-07-18 NOTE — ED Notes (Signed)
CBG 369 

## 2014-07-19 LAB — URINE CULTURE: Colony Count: 15000

## 2014-07-21 ENCOUNTER — Emergency Department (HOSPITAL_COMMUNITY)
Admission: EM | Admit: 2014-07-21 | Discharge: 2014-07-22 | Disposition: A | Discharge status: Home / Self Care | Payer: Medicaid Other | Attending: Emergency Medicine | Admitting: Emergency Medicine

## 2014-07-21 ENCOUNTER — Encounter (HOSPITAL_COMMUNITY): Payer: Self-pay | Admitting: Emergency Medicine

## 2014-07-21 DIAGNOSIS — F410 Panic disorder [episodic paroxysmal anxiety] without agoraphobia: Secondary | ICD-10-CM | POA: Diagnosis not present

## 2014-07-21 DIAGNOSIS — Z87891 Personal history of nicotine dependence: Secondary | ICD-10-CM | POA: Insufficient documentation

## 2014-07-21 DIAGNOSIS — Z8719 Personal history of other diseases of the digestive system: Secondary | ICD-10-CM | POA: Diagnosis not present

## 2014-07-21 DIAGNOSIS — Z8619 Personal history of other infectious and parasitic diseases: Secondary | ICD-10-CM | POA: Diagnosis not present

## 2014-07-21 DIAGNOSIS — Z794 Long term (current) use of insulin: Secondary | ICD-10-CM | POA: Insufficient documentation

## 2014-07-21 DIAGNOSIS — Z91040 Latex allergy status: Secondary | ICD-10-CM | POA: Insufficient documentation

## 2014-07-21 DIAGNOSIS — E1043 Type 1 diabetes mellitus with diabetic autonomic (poly)neuropathy: Secondary | ICD-10-CM | POA: Diagnosis not present

## 2014-07-21 DIAGNOSIS — R197 Diarrhea, unspecified: Secondary | ICD-10-CM | POA: Diagnosis not present

## 2014-07-21 DIAGNOSIS — Z792 Long term (current) use of antibiotics: Secondary | ICD-10-CM | POA: Diagnosis not present

## 2014-07-21 DIAGNOSIS — Z3202 Encounter for pregnancy test, result negative: Secondary | ICD-10-CM | POA: Diagnosis not present

## 2014-07-21 DIAGNOSIS — Z79899 Other long term (current) drug therapy: Secondary | ICD-10-CM | POA: Insufficient documentation

## 2014-07-21 DIAGNOSIS — Z862 Personal history of diseases of the blood and blood-forming organs and certain disorders involving the immune mechanism: Secondary | ICD-10-CM | POA: Insufficient documentation

## 2014-07-21 DIAGNOSIS — R1084 Generalized abdominal pain: Secondary | ICD-10-CM | POA: Diagnosis present

## 2014-07-21 DIAGNOSIS — K3184 Gastroparesis: Secondary | ICD-10-CM

## 2014-07-21 DIAGNOSIS — E1143 Type 2 diabetes mellitus with diabetic autonomic (poly)neuropathy: Secondary | ICD-10-CM

## 2014-07-21 LAB — URINALYSIS, ROUTINE W REFLEX MICROSCOPIC
Bilirubin Urine: NEGATIVE
Glucose, UA: >1000 mg/dL — AB
Hgb urine dipstick: NEGATIVE
Ketones, ur: NEGATIVE mg/dL
Leukocytes, UA: NEGATIVE
Nitrite: NEGATIVE
Protein, ur: NEGATIVE mg/dL
SPECIFIC GRAVITY, URINE: 1.042 — AB (ref 1.005–1.030)
Urobilinogen, UA: 0.2 mg/dL (ref 0.0–1.0)
pH: 5.5 (ref 5.0–8.0)

## 2014-07-21 LAB — CBC WITH DIFFERENTIAL/PLATELET
Basophils Absolute: 0 10*3/uL (ref 0.0–0.1)
Basophils Relative: 0 % (ref 0–1)
EOS ABS: 0 10*3/uL (ref 0.0–0.7)
Eosinophils Relative: 1 % (ref 0–5)
HCT: 40.5 % (ref 36.0–46.0)
Hemoglobin: 13.8 g/dL (ref 12.0–15.0)
Lymphocytes Relative: 42 % (ref 12–46)
Lymphs Abs: 2.7 10*3/uL (ref 0.7–4.0)
MCH: 30.7 pg (ref 26.0–34.0)
MCHC: 34.1 g/dL (ref 30.0–36.0)
MCV: 90 fL (ref 78.0–100.0)
Monocytes Absolute: 0.4 10*3/uL (ref 0.1–1.0)
Monocytes Relative: 6 % (ref 3–12)
NEUTROS ABS: 3.3 10*3/uL (ref 1.7–7.7)
Neutrophils Relative %: 51 % (ref 43–77)
PLATELETS: 304 10*3/uL (ref 150–400)
RBC: 4.5 MIL/uL (ref 3.87–5.11)
RDW: 12.5 % (ref 11.5–15.5)
WBC: 6.5 10*3/uL (ref 4.0–10.5)

## 2014-07-21 LAB — URINE MICROSCOPIC-ADD ON

## 2014-07-21 LAB — COMPREHENSIVE METABOLIC PANEL
ALT: 30 U/L (ref 14–54)
AST: 23 U/L (ref 15–41)
Albumin: 5.1 g/dL — ABNORMAL HIGH (ref 3.5–5.0)
Alkaline Phosphatase: 69 U/L (ref 38–126)
Anion gap: 8 (ref 5–15)
BUN: 14 mg/dL (ref 6–20)
CALCIUM: 9.7 mg/dL (ref 8.9–10.3)
CO2: 27 mmol/L (ref 22–32)
Chloride: 102 mmol/L (ref 101–111)
Creatinine, Ser: 0.54 mg/dL (ref 0.44–1.00)
GFR calc Af Amer: >60 mL/min (ref >60)
GFR calc non Af Amer: >60 mL/min (ref >60)
Glucose, Bld: 268 mg/dL — ABNORMAL HIGH (ref 65–99)
POTASSIUM: 3.8 mmol/L (ref 3.5–5.1)
SODIUM: 137 mmol/L (ref 135–145)
TOTAL PROTEIN: 8.3 g/dL — AB (ref 6.5–8.1)
Total Bilirubin: 0.4 mg/dL (ref 0.3–1.2)

## 2014-07-21 LAB — CBG MONITORING, ED: GLUCOSE-CAPILLARY: 259 mg/dL — AB (ref 65–99)

## 2014-07-21 LAB — LIPASE, BLOOD: Lipase: 10 U/L — ABNORMAL LOW (ref 22–51)

## 2014-07-21 LAB — POC URINE PREG, ED: PREG TEST UR: NEGATIVE

## 2014-07-21 MED ORDER — METOCLOPRAMIDE HCL 5 MG/ML IJ SOLN
10.0000 mg | Freq: Once | INTRAMUSCULAR | Status: AC
  Administered 2014-07-21: 10 mg via INTRAVENOUS
  Filled 2014-07-21: qty 2

## 2014-07-21 MED ORDER — HYDROMORPHONE HCL 1 MG/ML IJ SOLN
1.0000 mg | Freq: Once | INTRAMUSCULAR | Status: AC
  Administered 2014-07-21: 1 mg via INTRAVENOUS
  Filled 2014-07-21: qty 1

## 2014-07-21 MED ORDER — ONDANSETRON 8 MG PO TBDP
8.0000 mg | ORAL_TABLET | Freq: Once | ORAL | Status: AC
  Administered 2014-07-21: 8 mg via ORAL
  Filled 2014-07-21: qty 1

## 2014-07-21 MED ORDER — SODIUM CHLORIDE 0.9 % IV BOLUS (SEPSIS)
1000.0000 mL | Freq: Once | INTRAVENOUS | Status: AC
  Administered 2014-07-21: 1000 mL via INTRAVENOUS

## 2014-07-21 NOTE — ED Notes (Signed)
Pt from home c/o generalized abdominal pain that has been on going since December. Pt seen in ED on 5/26 for same. Pt reports  She reports 5 episodes of emesis and 15 episodes of diarrhea in last 24 hours. HX of diabetes.

## 2014-07-21 NOTE — ED Provider Notes (Signed)
CSN: [id number]     Arrival date & time 07/21/14  2035 History   First MD Initiated Contact with Patient 07/21/14 2219     Chief Complaint  Patient presents with  . Abdominal Pain     (Consider location/radiation/quality/duration/timing/severity/associated sxs/prior Treatment) Patient is a 34 y.o. female presenting with abdominal pain. The history is provided by the patient and medical records. No language interpreter was used.  Abdominal Pain Associated symptoms: diarrhea, nausea and vomiting   Associated symptoms: no chest pain, no constipation, no cough, no dysuria, no fatigue, no fever, no hematuria and no shortness of breath      [name] is a 34 y.o. female  with a hx of IDDM, hypothyroid, chronic back pain ,anxiety, gastroparesis, anemia presents to the Emergency Department complaining of gradual, persistent, progressively worsening generalized abd pain with recurrent nausea vomiting and diarrhea worsening early this morning. Pt reports that she has had intermittent abdominal pain since Dec 2016, and was dx with C.diff in April after being given antibiotics for sepsis.  Pt reports she took oxycodone and tylenol, both of which she vomited.  Pt reports 5 episodes of NBNB emesis.  She reports 15 episodes of diarrhea without melena or hematochezia.  Pt denies a change in the pain or her current situation but reports that the severity has increased and her home medications are not helping.  Nothing makes it better and nothing makes it worse.  Pt denies fever, chills, headache, neck pain, chest pain, SOB, weakness, dizziness, syncope, dysuria, hematuria.  Pt reports she is currently taking flagyl to treat the C. Diff.  Denies sick contacts.     Past Medical History  Diagnosis Date  . DM type 1 causing complication dx 5427    hyperglycemia +/- DKA + coma, severe hypoglycemia, gastroparesis, peripheral neuropathy  . Thyroid disease     hypothyroidism associated with pregnancy  . Back  pain   . PONV (postoperative nausea and vomiting)   . Anxiety     hx panic attacks.   Marland Kitchen GERD (gastroesophageal reflux disease)   . DM gastroparesis   . Anemia     .Autoimmune hemolytic anemia. bone marrow bx 2008, Dr Marin Olp  . Colitis, Clostridium difficile 05/2014  . Fatty liver 01/2007    with hepatomegaly on CT scan.    Past Surgical History  Procedure Laterality Date  . Laparoscopy  02/2002, 06/2010    laparoscopy with lysis pelvic adhesions for pelvic pain  . Back surgery      age 20  . Tooth extraction    . Root canal  10/10/12  . Flexible sigmoidoscopy N/A 06/24/2014    Procedure: FLEXIBLE SIGMOIDOSCOPY;  Surgeon: Milus Banister, MD;  Location: [facility];  Service: Endoscopy;  Laterality: N/A;  . Esophagogastroduodenoscopy (egd) with propofol N/A 06/27/2014    Procedure: ESOPHAGOGASTRODUODENOSCOPY (EGD) WITH PROPOFOL;  Surgeon: Milus Banister, MD;  Location: [facility];  Service: Endoscopy;  Laterality: N/A;   Family History  Problem Relation Age of Onset  . Diabetes Maternal Grandmother   . Cancer Maternal Grandfather     Small cell lung cancer  . Cancer Maternal Grandmother     Leukemia   History  Substance Use Topics  . Smoking status: Former Smoker    Quit date: 04/19/2000  . Smokeless tobacco: Former Systems developer  . Alcohol Use: Yes     Comment: RARE SIPS OF WINE   OB History    No data available     Review of  Systems  Constitutional: Negative for fever, diaphoresis, appetite change, fatigue and unexpected weight change.  HENT: Negative for mouth sores and trouble swallowing.   Respiratory: Negative for cough, chest tightness, shortness of breath, wheezing and stridor.   Cardiovascular: Negative for chest pain and palpitations.  Gastrointestinal: Positive for nausea, vomiting, abdominal pain and diarrhea. Negative for constipation, blood in stool, abdominal distention and rectal pain.  Genitourinary: Negative for dysuria, urgency, frequency, hematuria, flank  pain and difficulty urinating.  Musculoskeletal: Negative for back pain, neck pain and neck stiffness.  Skin: Negative for rash.  Neurological: Negative for weakness.  Hematological: Negative for adenopathy.  Psychiatric/Behavioral: Negative for confusion.  All other systems reviewed and are negative.     Allergies  Scallops; Morphine and related; Latex; and Other  Home Medications   Prior to Admission medications   Medication Sig Start Date End Date Taking? Authorizing Provider  acetaminophen (TYLENOL) 500 MG tablet Take 500 mg by mouth every 6 (six) hours as needed for moderate pain or headache (headache and stomach pain).    Yes Historical Provider, MD  ALPRAZolam (XANAX) 0.25 MG tablet Take 0.25 mg by mouth 2 (two) times daily as needed for anxiety or sleep.   Yes Historical Provider, MD  amitriptyline (ELAVIL) 50 MG tablet Take 100 mg by mouth at bedtime.    Yes Historical Provider, MD  colestipol (COLESTID) 5 G packet Take 5 g by mouth 2 (two) times daily with a meal. 07/06/14  Yes Thurnell Lose, MD  cyanocobalamin 500 MCG tablet Take 500 mcg by mouth 2 (two) times daily.   Yes Historical Provider, MD  dicyclomine (BENTYL) 10 MG capsule Take 10 mg by mouth 4 (four) times daily -  before meals and at bedtime.   Yes Historical Provider, MD  feeding supplement, GLUCERNA SHAKE, (GLUCERNA SHAKE) LIQD Take 237 mLs by mouth 3 (three) times daily between meals. 07/06/14  Yes Thurnell Lose, MD  gabapentin (NEURONTIN) 100 MG capsule Take 300 mg by mouth at bedtime.    Yes Historical Provider, MD  insulin aspart (NOVOLOG) 100 UNIT/ML injection Inject 0-20 Units into the skin 3 (three) times daily with meals. CBG < 70: implement hypoglycemia protocol; CBG > 400: call MD ;CBG 70 - 120: 0 units; CBG 121 - 150: 3 units; CBG 151 - 200: 4 units; CBG 201 - 250: 7 units; CBG 251 - 300: 11 units; CBG 301 - 350: 15 units; CBG 351 - 400: 20 units; CBG > 400: call MD 02/01/13  Yes Donita Brooks, NP   insulin glargine (LANTUS) 100 UNIT/ML injection Inject 0.15 mLs (15 Units total) into the skin every morning. 02/19/14  Yes Philemon Kingdom, MD  lipase/protease/amylase 24000 UNITS CPEP Take 1 capsule (24,000 Units total) by mouth 3 (three) times daily before meals. 07/06/14  Yes Thurnell Lose, MD  loperamide (IMODIUM) 2 MG capsule Take 2 capsules (4 mg total) by mouth 4 (four) times daily -  before meals and at bedtime. 07/06/14  Yes Thurnell Lose, MD  metoCLOPramide (REGLAN) 10 MG tablet Take 1 tablet by mouth 4 (four) times daily -  with meals and at bedtime. 04/21/14  Yes Historical Provider, MD  metroNIDAZOLE (FLAGYL) 500 MG tablet Take 500 mg by mouth 2 (two) times daily.   Yes Historical Provider, MD  Multiple Vitamin (MULTIVITAMIN WITH MINERALS) TABS Take 1 tablet by mouth every morning. Diabetic support vitamin   Yes Historical Provider, MD  ondansetron (ZOFRAN ODT) 4 MG disintegrating tablet Take  1 tablet (4 mg total) by mouth every 8 (eight) hours as needed for nausea or vomiting. 07/06/14  Yes Thurnell Lose, MD  oxyCODONE-acetaminophen (PERCOCET) 5-325 MG per tablet Take 1 tablet by mouth every 4 (four) hours as needed for severe pain. 07/06/14  Yes Thurnell Lose, MD  promethazine (PHENERGAN) 25 MG suppository Place 1 suppository (25 mg total) rectally every 6 (six) hours as needed for nausea or vomiting. 07/06/14  Yes Thurnell Lose, MD  saccharomyces boulardii (FLORASTOR) 250 MG capsule Take 1 capsule (250 mg total) by mouth 2 (two) times daily. 07/06/14  Yes Thurnell Lose, MD  diphenoxylate-atropine (LOMOTIL) 2.5-0.025 MG per tablet Take 2 tablets by mouth 2 (two) times daily. Patient not taking: Reported on 07/18/2014 07/06/14   Thurnell Lose, MD  glucagon (GLUCAGON EMERGENCY) 1 MG injection Inject 1 mg into the muscle once as needed (severe hypoglycemia). 02/19/14   Philemon Kingdom, MD  megestrol (MEGACE) 40 MG/ML suspension Take 800 mg by mouth daily.    Historical  Provider, MD  promethazine (PHENERGAN) 25 MG suppository Place 1 suppository (25 mg total) rectally every 6 (six) hours as needed for nausea or vomiting. 07/22/14   Jarrett Soho Amaziah Ghosh, PA-C  rifaximin (XIFAXAN) 550 MG TABS tablet Take 1 tablet (550 mg total) by mouth 3 (three) times daily. Patient not taking: Reported on 07/18/2014 07/06/14   Thurnell Lose, MD   BP 101/55 mmHg  Pulse 70  Temp(Src) 98.6 F (37 C) (Oral)  Resp 16  Ht 5\' 5"  (1.651 m)  Wt 93 lb (42.185 kg)  BMI 15.48 kg/m2  SpO2 100% Physical Exam  Constitutional: She appears well-developed and well-nourished. No distress.  Awake, alert, nontoxic appearance  HENT:  Head: Normocephalic and atraumatic.  Mouth/Throat: Oropharynx is clear and moist. No oropharyngeal exudate.  Eyes: Conjunctivae are normal. No scleral icterus.  Neck: Normal range of motion. Neck supple.  Cardiovascular: Normal rate, regular rhythm, normal heart sounds and intact distal pulses.   Pulmonary/Chest: Effort normal and breath sounds normal. No respiratory distress. She has no wheezes.  Equal chest expansion  Abdominal: Soft. Bowel sounds are normal. She exhibits no distension and no mass. There is tenderness. There is no rebound, no guarding and no CVA tenderness.  Significant tenderness to palpation throughout the entirety of the abdomen without guarding or rebound No CVA tenderness Abdomen is not distended.  Musculoskeletal: Normal range of motion. She exhibits no edema.  Neurological: She is alert.  Speech is clear and goal oriented Moves extremities without ataxia  Skin: Skin is warm and dry. She is not diaphoretic.  Psychiatric: She has a normal mood and affect.  Nursing note and vitals reviewed.   ED Course  Procedures (including critical care time) Labs Review Labs Reviewed  URINALYSIS, ROUTINE W REFLEX MICROSCOPIC (NOT AT [facility]) - Abnormal; Notable for the following:    Specific Gravity, Urine 1.042 (*)    Glucose, UA >1000 (*)     All other components within normal limits  COMPREHENSIVE METABOLIC PANEL - Abnormal; Notable for the following:    Glucose, Bld 268 (*)    Total Protein 8.3 (*)    Albumin 5.1 (*)    All other components within normal limits  LIPASE, BLOOD - Abnormal; Notable for the following:    Lipase 10 (*)    All other components within normal limits  URINE MICROSCOPIC-ADD ON - Abnormal; Notable for the following:    Crystals CA OXALATE CRYSTALS (*)    All  other components within normal limits  CBG MONITORING, ED - Abnormal; Notable for the following:    Glucose-Capillary 259 (*)    All other components within normal limits  CBC WITH DIFFERENTIAL/PLATELET  CBC WITH DIFFERENTIAL/PLATELET  POC URINE PREG, ED    Imaging Review No results found.   EKG Interpretation None      MDM   Final diagnoses:  Diabetic gastroparesis  Generalized abdominal pain  Diarrhea   Tranesha A Castronovo presents with acute flare of chronic abdominal pain. Patient with history of gastroparesis. She endorses numerous episodes of vomiting and diarrhea today. Patient has been seen 11 times in the last 6 months for similar symptoms.  Labs are reassuring. There is no evidence of DKA today.  No emesis here in the emergency department. We'll give pain control, emesis control and reassess.  At this time patient is without rebound or signs of peritonitis. Patient has had 3 CT scans 04/30/2014.  Last CT scan on 06/21/2014 with: IMPRESSION: 1. Although the inflammatory changes around the sigmoid colon and rectum have largely resolved compared to the prior study, there appears to be some residual mural thickening throughout the distal sigmoid colon and rectum, which may indicate some residual colitis. This appears improved. The remainder the colon is otherwise normal in appearance.  Will give symptom control and reassess. She is afebrile, not tachycardic and without hypotension. Will hold on CT scan at this time.  1:31  AM Patient with pain under control.  Abdomen remained soft and is significantly less tender than on initial exam. Patient is tolerating by mouth without difficulty here in the emergency department. No further emesis or diarrhea while here. Patient has pain control at home. She request Phenergan suppository for nausea and vomiting control at home. Will write for this. Recommend close follow-up with primary care and GI specialist within the next several days.   Patient is nontoxic, nonseptic appearing, in no apparent distress.  Patient's pain and other symptoms adequately managed in emergency department.  Fluid bolus given.  Labs, imaging and vitals reviewed.  Patient does not meet the SIRS or Sepsis criteria.  On repeat exam patient does not have a surgical abdomin and there are no peritoneal signs.  No indication of appendicitis, bowel obstruction, bowel perforation, cholecystitis, diverticulitis, PID or ectopic pregnancy.  Patient discharged home with symptomatic treatment and given strict instructions for follow-up with their primary care physician.  I have also discussed reasons to return immediately to the ER.  Patient expresses understanding and agrees with plan.   BP 101/55 mmHg  Pulse 70  Temp(Src) 98.6 F (37 C) (Oral)  Resp 16  Ht 5\' 5"  (1.651 m)  Wt 93 lb (42.185 kg)  BMI 15.48 kg/m2  SpO2 100%     Abigail Butts, PA-C 07/22/14 0131  Milton Ferguson, MD 07/22/14 [phone]

## 2014-07-22 MED ORDER — HYDROMORPHONE HCL 1 MG/ML IJ SOLN
1.0000 mg | Freq: Once | INTRAMUSCULAR | Status: AC
  Administered 2014-07-22: 1 mg via INTRAVENOUS
  Filled 2014-07-22: qty 1

## 2014-07-22 MED ORDER — PROMETHAZINE HCL 25 MG RE SUPP: 25.0000 mg | Freq: Four times a day (QID) | RECTAL | Status: DC | PRN

## 2014-07-22 NOTE — Discharge Instructions (Signed)
1. Medications: Phenergan suppository, usual home medications °2. Treatment: rest, drink plenty of fluids, advance diet slowly °3. Follow Up: Please followup with your primary doctor in 2 days for discussion of your diagnoses and further evaluation after today's visit; if you do not have a primary care doctor use the resource guide provided to find one; Please return to the ER for persistent vomiting, high fevers or worsening symptoms ° °

## 2014-07-22 NOTE — ED Notes (Addendum)
Writer provided pt w/ two cups of diet soda, gram crackers, and peanut butter for PO challenge

## 2014-07-24 ENCOUNTER — Inpatient Hospital Stay (HOSPITAL_COMMUNITY)
Admission: EM | Admit: 2014-07-24 | Discharge: 2014-08-01 | DRG: 637 | Disposition: A | Discharge status: Home / Self Care | Payer: Medicaid Other | Attending: Internal Medicine | Admitting: Internal Medicine

## 2014-07-24 ENCOUNTER — Encounter (HOSPITAL_COMMUNITY): Payer: Self-pay | Admitting: *Deleted

## 2014-07-24 DIAGNOSIS — F4323 Adjustment disorder with mixed anxiety and depressed mood: Secondary | ICD-10-CM | POA: Diagnosis present

## 2014-07-24 DIAGNOSIS — Z79891 Long term (current) use of opiate analgesic: Secondary | ICD-10-CM

## 2014-07-24 DIAGNOSIS — Z833 Family history of diabetes mellitus: Secondary | ICD-10-CM

## 2014-07-24 DIAGNOSIS — Z79899 Other long term (current) drug therapy: Secondary | ICD-10-CM

## 2014-07-24 DIAGNOSIS — N189 Chronic kidney disease, unspecified: Secondary | ICD-10-CM | POA: Diagnosis present

## 2014-07-24 DIAGNOSIS — E039 Hypothyroidism, unspecified: Secondary | ICD-10-CM | POA: Diagnosis present

## 2014-07-24 DIAGNOSIS — K868 Other specified diseases of pancreas: Secondary | ICD-10-CM | POA: Diagnosis present

## 2014-07-24 DIAGNOSIS — Z91040 Latex allergy status: Secondary | ICD-10-CM

## 2014-07-24 DIAGNOSIS — I129 Hypertensive chronic kidney disease with stage 1 through stage 4 chronic kidney disease, or unspecified chronic kidney disease: Secondary | ICD-10-CM | POA: Diagnosis present

## 2014-07-24 DIAGNOSIS — E108 Type 1 diabetes mellitus with unspecified complications: Secondary | ICD-10-CM | POA: Diagnosis not present

## 2014-07-24 DIAGNOSIS — D591 Other autoimmune hemolytic anemias: Secondary | ICD-10-CM | POA: Diagnosis present

## 2014-07-24 DIAGNOSIS — E1043 Type 1 diabetes mellitus with diabetic autonomic (poly)neuropathy: Secondary | ICD-10-CM | POA: Diagnosis present

## 2014-07-24 DIAGNOSIS — K219 Gastro-esophageal reflux disease without esophagitis: Secondary | ICD-10-CM | POA: Diagnosis present

## 2014-07-24 DIAGNOSIS — E1110 Type 2 diabetes mellitus with ketoacidosis without coma: Secondary | ICD-10-CM

## 2014-07-24 DIAGNOSIS — R197 Diarrhea, unspecified: Secondary | ICD-10-CM | POA: Diagnosis not present

## 2014-07-24 DIAGNOSIS — Z87891 Personal history of nicotine dependence: Secondary | ICD-10-CM | POA: Diagnosis not present

## 2014-07-24 DIAGNOSIS — Z794 Long term (current) use of insulin: Secondary | ICD-10-CM | POA: Diagnosis not present

## 2014-07-24 DIAGNOSIS — G8929 Other chronic pain: Secondary | ICD-10-CM | POA: Diagnosis present

## 2014-07-24 DIAGNOSIS — K760 Fatty (change of) liver, not elsewhere classified: Secondary | ICD-10-CM | POA: Diagnosis present

## 2014-07-24 DIAGNOSIS — R109 Unspecified abdominal pain: Secondary | ICD-10-CM | POA: Diagnosis not present

## 2014-07-24 DIAGNOSIS — E1010 Type 1 diabetes mellitus with ketoacidosis without coma: Principal | ICD-10-CM | POA: Diagnosis present

## 2014-07-24 DIAGNOSIS — E43 Unspecified severe protein-calorie malnutrition: Secondary | ICD-10-CM | POA: Diagnosis present

## 2014-07-24 DIAGNOSIS — Z806 Family history of leukemia: Secondary | ICD-10-CM

## 2014-07-24 DIAGNOSIS — K909 Intestinal malabsorption, unspecified: Secondary | ICD-10-CM | POA: Diagnosis not present

## 2014-07-24 DIAGNOSIS — K8681 Exocrine pancreatic insufficiency: Secondary | ICD-10-CM

## 2014-07-24 DIAGNOSIS — F410 Panic disorder [episodic paroxysmal anxiety] without agoraphobia: Secondary | ICD-10-CM | POA: Diagnosis present

## 2014-07-24 DIAGNOSIS — Z885 Allergy status to narcotic agent status: Secondary | ICD-10-CM

## 2014-07-24 DIAGNOSIS — Z91013 Allergy to seafood: Secondary | ICD-10-CM | POA: Diagnosis not present

## 2014-07-24 DIAGNOSIS — Z681 Body mass index (BMI) 19 or less, adult: Secondary | ICD-10-CM | POA: Diagnosis not present

## 2014-07-24 DIAGNOSIS — K3184 Gastroparesis: Secondary | ICD-10-CM | POA: Diagnosis present

## 2014-07-24 DIAGNOSIS — E1143 Type 2 diabetes mellitus with diabetic autonomic (poly)neuropathy: Secondary | ICD-10-CM | POA: Diagnosis not present

## 2014-07-24 DIAGNOSIS — R159 Full incontinence of feces: Secondary | ICD-10-CM | POA: Diagnosis not present

## 2014-07-24 DIAGNOSIS — Z801 Family history of malignant neoplasm of trachea, bronchus and lung: Secondary | ICD-10-CM

## 2014-07-24 DIAGNOSIS — R1084 Generalized abdominal pain: Secondary | ICD-10-CM | POA: Diagnosis not present

## 2014-07-24 LAB — COMPREHENSIVE METABOLIC PANEL
ALBUMIN: 4.8 g/dL (ref 3.5–5.0)
ALT: 31 U/L (ref 14–54)
ANION GAP: 20 — AB (ref 5–15)
AST: 20 U/L (ref 15–41)
Alkaline Phosphatase: 72 U/L (ref 38–126)
BUN: 22 mg/dL — AB (ref 6–20)
CALCIUM: 8.8 mg/dL — AB (ref 8.9–10.3)
CO2: 11 mmol/L — AB (ref 22–32)
Chloride: 99 mmol/L — ABNORMAL LOW (ref 101–111)
Creatinine, Ser: 0.85 mg/dL (ref 0.44–1.00)
Glucose, Bld: 361 mg/dL — ABNORMAL HIGH (ref 65–99)
Potassium: 4.5 mmol/L (ref 3.5–5.1)
SODIUM: 130 mmol/L — AB (ref 135–145)
TOTAL PROTEIN: 8 g/dL (ref 6.5–8.1)
Total Bilirubin: 1.1 mg/dL (ref 0.3–1.2)

## 2014-07-24 LAB — CBC WITH DIFFERENTIAL/PLATELET
Basophils Absolute: 0 10*3/uL (ref 0.0–0.1)
Basophils Relative: 0 % (ref 0–1)
EOS ABS: 0 10*3/uL (ref 0.0–0.7)
Eosinophils Relative: 0 % (ref 0–5)
HCT: 38.5 % (ref 36.0–46.0)
Hemoglobin: 12.7 g/dL (ref 12.0–15.0)
Lymphocytes Relative: 26 % (ref 12–46)
Lymphs Abs: 2 10*3/uL (ref 0.7–4.0)
MCH: 29.3 pg (ref 26.0–34.0)
MCHC: 33 g/dL (ref 30.0–36.0)
MCV: 88.7 fL (ref 78.0–100.0)
MONO ABS: 0.3 10*3/uL (ref 0.1–1.0)
MONOS PCT: 4 % (ref 3–12)
NEUTROS ABS: 5.5 10*3/uL (ref 1.7–7.7)
Neutrophils Relative %: 70 % (ref 43–77)
Platelets: 248 10*3/uL (ref 150–400)
RBC: 4.34 MIL/uL (ref 3.87–5.11)
RDW: 12.3 % (ref 11.5–15.5)
WBC: 7.8 10*3/uL (ref 4.0–10.5)

## 2014-07-24 LAB — URINALYSIS, ROUTINE W REFLEX MICROSCOPIC
BILIRUBIN URINE: NEGATIVE
Glucose, UA: >1000 mg/dL — AB
HGB URINE DIPSTICK: NEGATIVE
Ketones, ur: >80 mg/dL — AB
LEUKOCYTES UA: NEGATIVE
NITRITE: NEGATIVE
Protein, ur: NEGATIVE mg/dL
Specific Gravity, Urine: 1.024 (ref 1.005–1.030)
Urobilinogen, UA: 0.2 mg/dL (ref 0.0–1.0)
pH: 5.5 (ref 5.0–8.0)

## 2014-07-24 LAB — URINE MICROSCOPIC-ADD ON

## 2014-07-24 LAB — POC URINE PREG, ED: PREG TEST UR: NEGATIVE

## 2014-07-24 LAB — LIPASE, BLOOD: Lipase: <10 U/L — ABNORMAL LOW (ref 22–51)

## 2014-07-24 LAB — CBG MONITORING, ED
GLUCOSE-CAPILLARY: 325 mg/dL — AB (ref 65–99)
Glucose-Capillary: 390 mg/dL — ABNORMAL HIGH (ref 65–99)

## 2014-07-24 MED ORDER — SODIUM CHLORIDE 0.9 % IV SOLN
INTRAVENOUS | Status: DC
  Administered 2014-07-24: 3 [IU]/h via INTRAVENOUS
  Filled 2014-07-24: qty 2.5

## 2014-07-24 MED ORDER — ONDANSETRON HCL 4 MG/2ML IJ SOLN
4.0000 mg | Freq: Once | INTRAMUSCULAR | Status: AC
  Administered 2014-07-24: 4 mg via INTRAVENOUS
  Filled 2014-07-24: qty 2

## 2014-07-24 MED ORDER — PROMETHAZINE HCL 25 MG/ML IJ SOLN
12.5000 mg | Freq: Once | INTRAMUSCULAR | Status: AC
  Administered 2014-07-24: 12.5 mg via INTRAVENOUS
  Filled 2014-07-24: qty 1

## 2014-07-24 MED ORDER — SODIUM CHLORIDE 0.9 % IV BOLUS (SEPSIS)
1000.0000 mL | Freq: Once | INTRAVENOUS | Status: AC
  Administered 2014-07-24: 1000 mL via INTRAVENOUS

## 2014-07-24 MED ORDER — KETOROLAC TROMETHAMINE 30 MG/ML IJ SOLN: 30.0000 mg | Freq: Once | INTRAMUSCULAR | Status: DC

## 2014-07-24 MED ORDER — HYDROMORPHONE HCL 1 MG/ML IJ SOLN
1.0000 mg | Freq: Once | INTRAMUSCULAR | Status: AC
  Administered 2014-07-24: 1 mg via INTRAVENOUS
  Filled 2014-07-24: qty 1

## 2014-07-24 MED ORDER — HYDROMORPHONE HCL 1 MG/ML IJ SOLN
0.5000 mg | Freq: Once | INTRAMUSCULAR | Status: AC
  Administered 2014-07-24: 0.5 mg via INTRAVENOUS
  Filled 2014-07-24: qty 1

## 2014-07-24 MED ORDER — KETOROLAC TROMETHAMINE 30 MG/ML IJ SOLN
15.0000 mg | Freq: Once | INTRAMUSCULAR | Status: DC
  Filled 2014-07-24: qty 1

## 2014-07-24 MED ORDER — PROMETHAZINE HCL 25 MG/ML IJ SOLN: 12.5000 mg | Freq: Once | INTRAMUSCULAR | Status: DC

## 2014-07-24 MED ORDER — DEXTROSE-NACL 5-0.45 % IV SOLN: INTRAVENOUS | Status: DC

## 2014-07-24 NOTE — H&P (Signed)
PCP: [name], MD  [name]  Referring provider [place]   Chief Complaint: Nausea and abdominal pain   Of note patient has frequent bouts of abdominal pain sometimes associated with  DKA. Patient states that she started to have abdominal pain since today. The pain is diffuse, Similar to prior episodes. In the  department she was noted to be hyperglycemic with evidence of mild DKA.   In 9 Currently at 20 she was given 2 L of normal saline and started on glucose stabilizer. Emergency department Patient was given Dilaudid 1 mg 2 Phenergan 12.5 mg times twice.   Patient states her diarrhea is less frequent but with significant urgency. She is currently on Flagyl it's unclear what has been restarted.   Patient was admitted in the beginning of May with abdominal pain and diarrhea. She undergone flexible sigmoidoscopy on May 2 showed no pseudo-membranous colitis. Patient's medical course had been complicated by poor compliance. During her past admission she was negative for C. difficile negative celiac sprue workup as well. Patient was claiming to have 2-3 L of diarrhea output per day she had a rectal tube placed to monitor this and was found to have only 700 mL of soft stool produced. After after that patient was able to be discharged home  HPI: [name] is a 34 y.o. female   has a past medical history of DM type 1 causing complication (dx 8676); Thyroid disease; Back pain; PONV (postoperative nausea and vomiting); Anxiety; GERD (gastroesophageal reflux disease); DM gastroparesis; Anemia; Colitis, Clostridium difficile (05/2014); and Fatty liver (01/2007).      Hospitalist was called for admission for DKA  Review of Systems:    Pertinent positives include:  abdominal pain, nausea, vomiting, diarrhea,   Constitutional:  No weight loss, night sweats, Fevers, chills, fatigue, weight loss  HEENT:  No headaches, Difficulty swallowing,Tooth/dental problems,Sore throat,  No sneezing,  itching, ear ache, nasal congestion, post nasal drip,  Cardio-vascular:  No chest pain, Orthopnea, PND, anasarca, dizziness, palpitations.no Bilateral lower extremity swelling  GI:  No heartburn, indigestion,change in bowel habits, loss of appetite, melena, blood in stool, hematemesis Resp:  no shortness of breath at rest. No dyspnea on exertion, No excess mucus, no productive cough, No non-productive cough, No coughing up of blood.No change in color of mucus.No wheezing. Skin:  no rash or lesions. No jaundice GU:  no dysuria, change in color of urine, no urgency or frequency. No straining to urinate.  No flank pain.  Musculoskeletal:  No joint pain or no joint swelling. No decreased range of motion. No back pain.  Psych:  No change in mood or affect. No depression or anxiety. No memory loss.  Neuro: no localizing neurological complaints, no tingling, no weakness, no double vision, no gait abnormality, no slurred speech, no confusion  Otherwise ROS are negative except for above, 10 systems were reviewed  Past Medical History: Past Medical History  Diagnosis Date  . DM type 1 causing complication dx 1950    hyperglycemia +/- DKA + coma, severe hypoglycemia, gastroparesis, peripheral neuropathy  . Thyroid disease     hypothyroidism associated with pregnancy  . Back pain   . PONV (postoperative nausea and vomiting)   . Anxiety     hx panic attacks.   Marland Kitchen GERD (gastroesophageal reflux disease)   . DM gastroparesis   . Anemia     .Autoimmune hemolytic anemia. bone marrow bx 2008, Dr Marin Olp  . Colitis, Clostridium difficile 05/2014  . Fatty liver  01/2007    with hepatomegaly on CT scan.    Past Surgical History  Procedure Laterality Date  . Laparoscopy  02/2002, 06/2010    laparoscopy with lysis pelvic adhesions for pelvic pain  . Back surgery      age 37  . Tooth extraction    . Root canal  10/10/12  . Flexible sigmoidoscopy N/A 06/24/2014    Procedure: FLEXIBLE SIGMOIDOSCOPY;   Surgeon: Milus Banister, MD;  Location: [facility];  Service: Endoscopy;  Laterality: N/A;  . Esophagogastroduodenoscopy (egd) with propofol N/A 06/27/2014    Procedure: ESOPHAGOGASTRODUODENOSCOPY (EGD) WITH PROPOFOL;  Surgeon: Milus Banister, MD;  Location: [facility];  Service: Endoscopy;  Laterality: N/A;     Medications: Prior to Admission medications   Medication Sig Start Date End Date Taking? Authorizing Provider  acetaminophen (TYLENOL) 500 MG tablet Take 500 mg by mouth every 6 (six) hours as needed for moderate pain or headache (headache and stomach pain).    Yes Historical Provider, MD  ALPRAZolam (XANAX) 0.25 MG tablet Take 0.25 mg by mouth 2 (two) times daily as needed for anxiety or sleep.   Yes Historical Provider, MD  amitriptyline (ELAVIL) 50 MG tablet Take 100 mg by mouth at bedtime.    Yes Historical Provider, MD  colestipol (COLESTID) 5 G packet Take 5 g by mouth 2 (two) times daily with a meal. 07/06/14  Yes Thurnell Lose, MD  cyanocobalamin 500 MCG tablet Take 500 mcg by mouth 2 (two) times daily.   Yes Historical Provider, MD  dicyclomine (BENTYL) 10 MG capsule Take 10 mg by mouth 4 (four) times daily -  before meals and at bedtime.   Yes Historical Provider, MD  feeding supplement, GLUCERNA SHAKE, (GLUCERNA SHAKE) LIQD Take 237 mLs by mouth 3 (three) times daily between meals. 07/06/14  Yes Thurnell Lose, MD  gabapentin (NEURONTIN) 100 MG capsule Take 300 mg by mouth at bedtime.    Yes Historical Provider, MD  glucagon (GLUCAGON EMERGENCY) 1 MG injection Inject 1 mg into the muscle once as needed (severe hypoglycemia). 02/19/14  Yes Philemon Kingdom, MD  insulin aspart (NOVOLOG) 100 UNIT/ML injection Inject 0-20 Units into the skin 3 (three) times daily with meals. CBG < 70: implement hypoglycemia protocol; CBG > 400: call MD ;CBG 70 - 120: 0 units; CBG 121 - 150: 3 units; CBG 151 - 200: 4 units; CBG 201 - 250: 7 units; CBG 251 - 300: 11 units; CBG 301 - 350: 15  units; CBG 351 - 400: 20 units; CBG > 400: call MD 02/01/13  Yes Donita Brooks, NP  insulin glargine (LANTUS) 100 UNIT/ML injection Inject 0.15 mLs (15 Units total) into the skin every morning. 02/19/14  Yes Philemon Kingdom, MD  lipase/protease/amylase 24000 UNITS CPEP Take 1 capsule (24,000 Units total) by mouth 3 (three) times daily before meals. 07/06/14  Yes Thurnell Lose, MD  loperamide (IMODIUM) 2 MG capsule Take 2 capsules (4 mg total) by mouth 4 (four) times daily -  before meals and at bedtime. 07/06/14  Yes Thurnell Lose, MD  metoCLOPramide (REGLAN) 10 MG tablet Take 1 tablet by mouth 4 (four) times daily -  with meals and at bedtime. 04/21/14  Yes Historical Provider, MD  metroNIDAZOLE (FLAGYL) 500 MG tablet Take 500 mg by mouth 2 (two) times daily.   Yes Historical Provider, MD  Multiple Vitamin (MULTIVITAMIN WITH MINERALS) TABS Take 1 tablet by mouth every morning. Diabetic support vitamin   Yes Historical  Provider, MD  ondansetron (ZOFRAN ODT) 4 MG disintegrating tablet Take 1 tablet (4 mg total) by mouth every 8 (eight) hours as needed for nausea or vomiting. 07/06/14  Yes Thurnell Lose, MD  oxyCODONE-acetaminophen (PERCOCET) 5-325 MG per tablet Take 1 tablet by mouth every 4 (four) hours as needed for severe pain. 07/06/14  Yes Thurnell Lose, MD  promethazine (PHENERGAN) 25 MG suppository Place 1 suppository (25 mg total) rectally every 6 (six) hours as needed for nausea or vomiting. 07/22/14  Yes Hannah Muthersbaugh, PA-C  saccharomyces boulardii (FLORASTOR) 250 MG capsule Take 1 capsule (250 mg total) by mouth 2 (two) times daily. 07/06/14  Yes Thurnell Lose, MD  diphenoxylate-atropine (LOMOTIL) 2.5-0.025 MG per tablet Take 2 tablets by mouth 2 (two) times daily. Patient not taking: Reported on 07/18/2014 07/06/14   Thurnell Lose, MD  megestrol (MEGACE) 40 MG/ML suspension Take 800 mg by mouth daily.    Historical Provider, MD  promethazine (PHENERGAN) 25 MG suppository  Place 1 suppository (25 mg total) rectally every 6 (six) hours as needed for nausea or vomiting. Patient not taking: Reported on 07/24/2014 07/06/14   Thurnell Lose, MD  rifaximin (XIFAXAN) 550 MG TABS tablet Take 1 tablet (550 mg total) by mouth 3 (three) times daily. Patient not taking: Reported on 07/18/2014 07/06/14   Thurnell Lose, MD    Allergies:   Allergies  Allergen Reactions  . Scallops [Shellfish Allergy] Anaphylaxis  . Morphine And Related Itching  . Latex Rash  . Other Rash    sunscreen    Social History:  Ambulatory   Independently  Lives at home   With family     reports that she quit smoking about 14 years ago. She has quit using smokeless tobacco. She reports that she drinks alcohol. She reports that she does not use illicit drugs.    Family History: family history includes Cancer in her maternal grandfather and maternal grandmother; Diabetes in her maternal grandmother.    Physical Exam: Patient Vitals for the past 24 hrs:  BP Temp Temp src Pulse Resp SpO2  07/24/14 2130 (!) 97/54 mmHg - - 103 16 100 %  07/24/14 2100 (!) 97/51 mmHg - - 107 17 99 %  07/24/14 2030 94/62 mmHg - - 102 16 100 %  07/24/14 2010 94/63 mmHg - - 107 18 100 %  07/24/14 1638 102/71 mmHg 98 F (36.7 C) Oral 120 18 100 %    1. General:  in No Acute distress 2. Psychological: Alert and   Oriented 3. Head/ENT:     Dry Mucous Membranes                          Head Non traumatic, neck supple                          Normal   Dentition 4. SKIN: decreased Skin turgor,  Skin clean Dry and intact no rash 5. Heart: Regular rate and rhythm no Murmur, Rub or gallop 6. Lungs: Clear to auscultation bilaterally, no wheezes or crackles   7. Abdomen: Soft, mildly tender, Non distended 8. Lower extremities: no clubbing, cyanosis, or edema 9. Neurologically Grossly intact, moving all 4 extremities equally 10. MSK: Normal range of motion  body mass index is unknown because there is no weight  on file.   Labs on Admission:   Results for orders placed or performed during  the hospital encounter of 07/24/14 (from the past 24 hour(s))  CBG monitoring, ED     Status: Abnormal   Collection Time: 07/24/14  4:51 PM  Result Value Ref Range   Glucose-Capillary 390 (H) 65 - 99 mg/dL  Urinalysis, Routine w reflex microscopic (not at [facility])     Status: Abnormal   Collection Time: 07/24/14  5:36 PM  Result Value Ref Range   Color, Urine YELLOW YELLOW   APPearance CLEAR CLEAR   Specific Gravity, Urine 1.024 1.005 - 1.030   pH 5.5 5.0 - 8.0   Glucose, UA >1000 (A) NEGATIVE mg/dL   Hgb urine dipstick NEGATIVE NEGATIVE   Bilirubin Urine NEGATIVE NEGATIVE   Ketones, ur >80 (A) NEGATIVE mg/dL   Protein, ur NEGATIVE NEGATIVE mg/dL   Urobilinogen, UA 0.2 0.0 - 1.0 mg/dL   Nitrite NEGATIVE NEGATIVE   Leukocytes, UA NEGATIVE NEGATIVE  Urine microscopic-add on     Status: Abnormal   Collection Time: 07/24/14  5:36 PM  Result Value Ref Range   Squamous Epithelial / LPF FEW (A) RARE   WBC, UA 0-2 <3 WBC/hpf   Bacteria, UA RARE RARE   Casts HYALINE CASTS (A) NEGATIVE  CBC with Differential     Status: None   Collection Time: 07/24/14  7:32 PM  Result Value Ref Range   WBC 7.8 4.0 - 10.5 K/uL   RBC 4.34 3.87 - 5.11 MIL/uL   Hemoglobin 12.7 12.0 - 15.0 g/dL   HCT 38.5 36.0 - 46.0 %   MCV 88.7 78.0 - 100.0 fL   MCH 29.3 26.0 - 34.0 pg   MCHC 33.0 30.0 - 36.0 g/dL   RDW 12.3 11.5 - 15.5 %   Platelets 248 150 - 400 K/uL   Neutrophils Relative % 70 43 - 77 %   Neutro Abs 5.5 1.7 - 7.7 K/uL   Lymphocytes Relative 26 12 - 46 %   Lymphs Abs 2.0 0.7 - 4.0 K/uL   Monocytes Relative 4 3 - 12 %   Monocytes Absolute 0.3 0.1 - 1.0 K/uL   Eosinophils Relative 0 0 - 5 %   Eosinophils Absolute 0.0 0.0 - 0.7 K/uL   Basophils Relative 0 0 - 1 %   Basophils Absolute 0.0 0.0 - 0.1 K/uL  Comprehensive metabolic panel     Status: Abnormal   Collection Time: 07/24/14  7:32 PM  Result Value Ref Range    Sodium 130 (L) 135 - 145 mmol/L   Potassium 4.5 3.5 - 5.1 mmol/L   Chloride 99 (L) 101 - 111 mmol/L   CO2 11 (L) 22 - 32 mmol/L   Glucose, Bld 361 (H) 65 - 99 mg/dL   BUN 22 (H) 6 - 20 mg/dL   Creatinine, Ser 0.85 0.44 - 1.00 mg/dL   Calcium 8.8 (L) 8.9 - 10.3 mg/dL   Total Protein 8.0 6.5 - 8.1 g/dL   Albumin 4.8 3.5 - 5.0 g/dL   AST 20 15 - 41 U/L   ALT 31 14 - 54 U/L   Alkaline Phosphatase 72 38 - 126 U/L   Total Bilirubin 1.1 0.3 - 1.2 mg/dL   GFR calc non Af Amer >60 >60 mL/min   GFR calc Af Amer >60 >60 mL/min   Anion gap 20 (H) 5 - 15  Lipase, blood     Status: Abnormal   Collection Time: 07/24/14  7:32 PM  Result Value Ref Range   Lipase <10 (L) 22 - 51 U/L  POC Urine  Pregnancy, ED (if pre-menopausal female)  not at [facility]     Status: None   Collection Time: 07/24/14  8:03 PM  Result Value Ref Range   Preg Test, Ur NEGATIVE NEGATIVE    UA elevated ketones no evidence of UTI  Lab Results  Component Value Date   HGBA1C 11.8* 05/09/2014    Estimated Creatinine Clearance: 62.1 mL/min (by C-G formula based on Cr of 0.85).  BNP (last 3 results) No results for input(s): PROBNP in the last 8760 hours.  Other results:  I have pearsonaly reviewed this: ECG REPORT  Rate:118  Rhythm: Sinus tachycardia  ST&T Change: No evidence of ischemia QTC 447  There were no vitals filed for this visit.   Cultures:    Component Value Date/Time   SDES URINE, CLEAN CATCH 07/18/2014 1037   SPECREQUEST NONE 07/18/2014 1037   CULT  07/18/2014 1037    Multiple bacterial morphotypes present, none predominant. Suggest appropriate recollection if clinically indicated. Performed at [place] 07/19/2014 FINAL 07/18/2014 1037     Radiological Exams on Admission: No results found.  Chart has been reviewed  Family   at  Bedside  plan of care was discussed with Mother [name] [phone]  Assessment/Plan 34 year old female history of type 1 diabetes  with history of C. difficile colitis chronic diarrhea and gastroparesis presents with abdominal pain evidence of DKA and self-reported diarrhea  Present on Admission:  . DKA, type 1 - will admit per DKA protocol, obtain serial BMET, start on glucosestabalizer, aggressive IVF. Change IVF to D5 1/2Na after BG <250 . Will work up cause of DKA with CXR, ECG one set of cardiac enzymes, UA. Monitor on telemetry. Replace potassium as needed.     . Diabetic gastroparesis -continue Reglan  . Abdominal pain patient with chronic abdominal pain. Given low blood pressures in emergency department will hold off on Dilaudid  . Diarrhea - patient has been admitted with prior ongoing diarrhea of unclear etiology. Will need careful monitoring of true months of diarrhea and GI consult tomorrow morning given prior history of C. Difficile  will obtain C. difficile PCR.    Currently patient on Flagyl possibly for C. difficile treatment although patient has tested negative while hospitalized 1 month ago.  Prophylaxis:   Lovenox   CODE STATUS:  FULL CODE   as per patient   Disposition:   To home once workup is complete and patient is stable  Other plan as per orders.  I have spent a total of 55 min on this admission  [name] 07/24/2014, 10:58 PM  [facility]  Pager [phone]   after 2 AM please page floor coverage PA If 7AM-7PM, please contact the day team taking care of the patient  [email or web address]  Password [email or web address]

## 2014-07-24 NOTE — ED Notes (Signed)
Per EMS pt with Hx of brittle diabetes mellitis and DKA x 40 since 2012 c/o n/v/diarrhea and severe abdominal pain, along with dehydration and CBG 388. Pt reported CBG rose from 300 to 388 rapidly without having consumed any food or sugar.

## 2014-07-24 NOTE — ED Notes (Signed)
Per RN Lanelle Bal, patient will probably need to have fluids before labs will be successfully drawn

## 2014-07-24 NOTE — ED Provider Notes (Signed)
CSN: [id number]     Arrival date & time 07/24/14  1630 History   First MD Initiated Contact with Patient 07/24/14 1650     Chief Complaint  Patient presents with  . Hyperglycemia     (Consider location/radiation/quality/duration/timing/severity/associated sxs/prior Treatment) HPI [name] is a 34 year-old female with past medical history of IDDM type 1, C. difficile colitis, chronic back pain, gastroparesis who presents the ER complaining of hyperglycemia. Patient states over the past 12 hours she has been experiencing elevated blood sugar, generalized malaise, nausea, vomiting, generalized abdominal pain. Patient states her signs and symptoms are consistent with an identical to previous episodes of hypoglycemia she has extremes in the past, more specifically episodes of DKA she has increased in the past. Patient states her abdominal pain is "all over". She reports several episodes of nonbilious, nonbloody emesis today along with several episodes of diarrhea. Patient denies any chest pain, shortness of breath, fever, chills, dysuria.  Past Medical History  Diagnosis Date  . DM type 1 causing complication dx 4196    hyperglycemia +/- DKA + coma, severe hypoglycemia, gastroparesis, peripheral neuropathy  . Thyroid disease     hypothyroidism associated with pregnancy  . Back pain   . PONV (postoperative nausea and vomiting)   . Anxiety     hx panic attacks.   Marland Kitchen GERD (gastroesophageal reflux disease)   . DM gastroparesis   . Anemia     .Autoimmune hemolytic anemia. bone marrow bx 2008, Dr Marin Olp  . Colitis, Clostridium difficile 05/2014  . Fatty liver 01/2007    with hepatomegaly on CT scan.    Past Surgical History  Procedure Laterality Date  . Laparoscopy  02/2002, 06/2010    laparoscopy with lysis pelvic adhesions for pelvic pain  . Back surgery      age 38  . Tooth extraction    . Root canal  10/10/12  . Flexible sigmoidoscopy N/A 06/24/2014    Procedure: FLEXIBLE  SIGMOIDOSCOPY;  Surgeon: Milus Banister, MD;  Location: [facility];  Service: Endoscopy;  Laterality: N/A;  . Esophagogastroduodenoscopy (egd) with propofol N/A 06/27/2014    Procedure: ESOPHAGOGASTRODUODENOSCOPY (EGD) WITH PROPOFOL;  Surgeon: Milus Banister, MD;  Location: [facility];  Service: Endoscopy;  Laterality: N/A;   Family History  Problem Relation Age of Onset  . Diabetes Maternal Grandmother   . Cancer Maternal Grandmother     Leukemia  . Cancer Maternal Grandfather     Small cell lung cancer   History  Substance Use Topics  . Smoking status: Former Smoker    Quit date: 04/19/2000  . Smokeless tobacco: Former Systems developer  . Alcohol Use: Yes     Comment: RARE SIPS OF WINE   OB History    No data available     Review of Systems  Constitutional: Positive for fatigue. Negative for fever.  HENT: Negative for trouble swallowing.   Eyes: Negative for visual disturbance.  Respiratory: Negative for shortness of breath.   Cardiovascular: Negative for chest pain.  Gastrointestinal: Positive for nausea, vomiting and abdominal pain.  Genitourinary: Negative for dysuria.  Musculoskeletal: Negative for neck pain.  Skin: Negative for rash.  Neurological: Negative for dizziness, weakness and numbness.  Psychiatric/Behavioral: Negative.       Allergies  Scallops; Morphine and related; Latex; and Other  Home Medications   Prior to Admission medications   Medication Sig Start Date End Date Taking? Authorizing Provider  acetaminophen (TYLENOL) 500 MG tablet Take 500 mg by mouth every 6 (  six) hours as needed for moderate pain or headache (headache and stomach pain).    Yes Historical Provider, MD  ALPRAZolam (XANAX) 0.25 MG tablet Take 0.25 mg by mouth 2 (two) times daily as needed for anxiety or sleep.   Yes Historical Provider, MD  amitriptyline (ELAVIL) 50 MG tablet Take 100 mg by mouth at bedtime.    Yes Historical Provider, MD  colestipol (COLESTID) 5 G packet Take 5 g by  mouth 2 (two) times daily with a meal. 07/06/14  Yes Thurnell Lose, MD  cyanocobalamin 500 MCG tablet Take 500 mcg by mouth 2 (two) times daily.   Yes Historical Provider, MD  dicyclomine (BENTYL) 10 MG capsule Take 10 mg by mouth 4 (four) times daily -  before meals and at bedtime.   Yes Historical Provider, MD  feeding supplement, GLUCERNA SHAKE, (GLUCERNA SHAKE) LIQD Take 237 mLs by mouth 3 (three) times daily between meals. 07/06/14  Yes Thurnell Lose, MD  gabapentin (NEURONTIN) 100 MG capsule Take 300 mg by mouth at bedtime.    Yes Historical Provider, MD  glucagon (GLUCAGON EMERGENCY) 1 MG injection Inject 1 mg into the muscle once as needed (severe hypoglycemia). 02/19/14  Yes Philemon Kingdom, MD  insulin aspart (NOVOLOG) 100 UNIT/ML injection Inject 0-20 Units into the skin 3 (three) times daily with meals. CBG < 70: implement hypoglycemia protocol; CBG > 400: call MD ;CBG 70 - 120: 0 units; CBG 121 - 150: 3 units; CBG 151 - 200: 4 units; CBG 201 - 250: 7 units; CBG 251 - 300: 11 units; CBG 301 - 350: 15 units; CBG 351 - 400: 20 units; CBG > 400: call MD 02/01/13  Yes Donita Brooks, NP  insulin glargine (LANTUS) 100 UNIT/ML injection Inject 0.15 mLs (15 Units total) into the skin every morning. 02/19/14  Yes Philemon Kingdom, MD  lipase/protease/amylase 24000 UNITS CPEP Take 1 capsule (24,000 Units total) by mouth 3 (three) times daily before meals. 07/06/14  Yes Thurnell Lose, MD  loperamide (IMODIUM) 2 MG capsule Take 2 capsules (4 mg total) by mouth 4 (four) times daily -  before meals and at bedtime. 07/06/14  Yes Thurnell Lose, MD  metoCLOPramide (REGLAN) 10 MG tablet Take 1 tablet by mouth 4 (four) times daily -  with meals and at bedtime. 04/21/14  Yes Historical Provider, MD  metroNIDAZOLE (FLAGYL) 500 MG tablet Take 500 mg by mouth 2 (two) times daily.   Yes Historical Provider, MD  Multiple Vitamin (MULTIVITAMIN WITH MINERALS) TABS Take 1 tablet by mouth every morning. Diabetic  support vitamin   Yes Historical Provider, MD  ondansetron (ZOFRAN ODT) 4 MG disintegrating tablet Take 1 tablet (4 mg total) by mouth every 8 (eight) hours as needed for nausea or vomiting. 07/06/14  Yes Thurnell Lose, MD  oxyCODONE-acetaminophen (PERCOCET) 5-325 MG per tablet Take 1 tablet by mouth every 4 (four) hours as needed for severe pain. 07/06/14  Yes Thurnell Lose, MD  promethazine (PHENERGAN) 25 MG suppository Place 1 suppository (25 mg total) rectally every 6 (six) hours as needed for nausea or vomiting. 07/22/14  Yes Hannah Muthersbaugh, PA-C  saccharomyces boulardii (FLORASTOR) 250 MG capsule Take 1 capsule (250 mg total) by mouth 2 (two) times daily. 07/06/14  Yes Thurnell Lose, MD  diphenoxylate-atropine (LOMOTIL) 2.5-0.025 MG per tablet Take 2 tablets by mouth 2 (two) times daily. Patient not taking: Reported on 07/18/2014 07/06/14   Thurnell Lose, MD  megestrol (MEGACE) 40 MG/ML  suspension Take 800 mg by mouth daily.    Historical Provider, MD  promethazine (PHENERGAN) 25 MG suppository Place 1 suppository (25 mg total) rectally every 6 (six) hours as needed for nausea or vomiting. Patient not taking: Reported on 07/24/2014 07/06/14   Thurnell Lose, MD  rifaximin (XIFAXAN) 550 MG TABS tablet Take 1 tablet (550 mg total) by mouth 3 (three) times daily. Patient not taking: Reported on 07/18/2014 07/06/14   Thurnell Lose, MD   BP 84/51 mmHg  Pulse 90  Temp(Src) 98.6 F (37 C) (Oral)  Resp 16  SpO2 98%  LMP 01/22/2014 Physical Exam  Constitutional: She is oriented to person, place, and time. She appears well-developed and well-nourished. She has a sickly appearance.  Sickly appearing female in mild distress from pain  HENT:  Head: Normocephalic and atraumatic.  Mouth/Throat: Oropharynx is clear and moist. No oropharyngeal exudate.  Eyes: Right eye exhibits no discharge. Left eye exhibits no discharge. No scleral icterus.  Neck: Normal range of motion.   Cardiovascular: Normal rate, regular rhythm and normal heart sounds.   No murmur heard. Pulmonary/Chest: Effort normal and breath sounds normal. No respiratory distress.  Abdominal: Soft. Normal appearance. There is generalized tenderness. There is no rigidity, no guarding, no tenderness at McBurney's point and negative Murphy's sign.  Musculoskeletal: Normal range of motion. She exhibits no edema or tenderness.  Neurological: She is alert and oriented to person, place, and time. No cranial nerve deficit. Coordination normal.  Skin: Skin is warm and dry. No rash noted. She is not diaphoretic.  Psychiatric: She has a normal mood and affect.  Nursing note and vitals reviewed.   ED Course  Procedures (including critical care time) Labs Review Labs Reviewed  COMPREHENSIVE METABOLIC PANEL - Abnormal; Notable for the following:    Sodium 130 (*)    Chloride 99 (*)    CO2 11 (*)    Glucose, Bld 361 (*)    BUN 22 (*)    Calcium 8.8 (*)    Anion gap 20 (*)    All other components within normal limits  URINALYSIS, ROUTINE W REFLEX MICROSCOPIC (NOT AT [facility]) - Abnormal; Notable for the following:    Glucose, UA >1000 (*)    Ketones, ur >80 (*)    All other components within normal limits  LIPASE, BLOOD - Abnormal; Notable for the following:    Lipase <10 (*)    All other components within normal limits  URINE MICROSCOPIC-ADD ON - Abnormal; Notable for the following:    Squamous Epithelial / LPF FEW (*)    Casts HYALINE CASTS (*)    All other components within normal limits  GLUCOSE, CAPILLARY - Abnormal; Notable for the following:    Glucose-Capillary 256 (*)    All other components within normal limits  CBG MONITORING, ED - Abnormal; Notable for the following:    Glucose-Capillary 390 (*)    All other components within normal limits  CBG MONITORING, ED - Abnormal; Notable for the following:    Glucose-Capillary 325 (*)    All other components within normal limits  CBG MONITORING,  ED - Abnormal; Notable for the following:    Glucose-Capillary 294 (*)    All other components within normal limits  CBC WITH DIFFERENTIAL/PLATELET  BASIC METABOLIC PANEL  BASIC METABOLIC PANEL  BASIC METABOLIC PANEL  BASIC METABOLIC PANEL  BASIC METABOLIC PANEL  BASIC METABOLIC PANEL  CBC  POC URINE PREG, ED    Imaging Review No results found.  EKG Interpretation   Date/Time:  Wednesday July 24 2014 17:10:48 EDT Ventricular Rate:  118 PR Interval:  129 QRS Duration: 81 QT Interval:  319 QTC Calculation: 447 R Axis:   80 Text Interpretation:  Sinus tachycardia Right atrial enlargement  Anteroseptal infarct, old Confirmed by Glynn Octave [phone]) on  07/24/2014 9:16:29 PM      MDM   Final diagnoses:  Diabetic ketoacidosis without coma associated with type 1 diabetes mellitus    Patient here with generalized malaise, lightheadedness, nausea, abdominal pain which is consistent with when patient has been in DKA in the past, patient noted to be in DKA today. Anion gap 20, ketones greater than 80 and urine. Will admit patient for DKA.  Patient with mild hypertension to high 78H systolic. Patient afebrile, hemodynamically stable and in no acute distress. Patient's signs and symptoms improved mildly after therapy here. The patient appears reasonably stabilized for admission considering the current resources, flow, and capabilities available in the ED at this time, and I doubt any other [facility] requiring further screening and/or treatment in the ED prior to admission.  BP 84/51 mmHg  Pulse 90  Temp(Src) 98.6 F (37 C) (Oral)  Resp 16  SpO2 98%  LMP 01/22/2014  Signed,  Dahlia Bailiff, PA-C 1:42 AM  Patient seen and discussed with Dr. Everlene Balls, MD  Dahlia Bailiff, PA-C 07/25/14 8850  Everlene Balls, MD 07/25/14 2225

## 2014-07-24 NOTE — ED Notes (Signed)
Patient states that today she felt really bad with intense abdominal pain. She has had DKA on several occassions and states this feels the same.

## 2014-07-25 DIAGNOSIS — E1010 Type 1 diabetes mellitus with ketoacidosis without coma: Principal | ICD-10-CM

## 2014-07-25 LAB — GLUCOSE, CAPILLARY
GLUCOSE-CAPILLARY: 254 mg/dL — AB (ref 65–99)
GLUCOSE-CAPILLARY: 86 mg/dL (ref 65–99)
GLUCOSE-CAPILLARY: 120 mg/dL — AB (ref 65–99)
GLUCOSE-CAPILLARY: 158 mg/dL — AB (ref 65–99)
GLUCOSE-CAPILLARY: 191 mg/dL — AB (ref 65–99)
GLUCOSE-CAPILLARY: 217 mg/dL — AB (ref 65–99)
Glucose-Capillary: 256 mg/dL — ABNORMAL HIGH (ref 65–99)
Glucose-Capillary: 195 mg/dL — ABNORMAL HIGH (ref 65–99)
Glucose-Capillary: 162 mg/dL — ABNORMAL HIGH (ref 65–99)
Glucose-Capillary: 96 mg/dL (ref 65–99)
Glucose-Capillary: 74 mg/dL (ref 65–99)
Glucose-Capillary: 74 mg/dL (ref 65–99)
Glucose-Capillary: 136 mg/dL — ABNORMAL HIGH (ref 65–99)
Glucose-Capillary: 119 mg/dL — ABNORMAL HIGH (ref 65–99)
Glucose-Capillary: 211 mg/dL — ABNORMAL HIGH (ref 65–99)
Glucose-Capillary: 182 mg/dL — ABNORMAL HIGH (ref 65–99)
Glucose-Capillary: 224 mg/dL — ABNORMAL HIGH (ref 65–99)

## 2014-07-25 LAB — BASIC METABOLIC PANEL
ANION GAP: 6 (ref 5–15)
ANION GAP: 7 (ref 5–15)
Anion gap: 7 (ref 5–15)
Anion gap: 6 (ref 5–15)
Anion gap: 9 (ref 5–15)
BUN: 18 mg/dL (ref 6–20)
BUN: 15 mg/dL (ref 6–20)
BUN: 13 mg/dL (ref 6–20)
BUN: 12 mg/dL (ref 6–20)
BUN: 10 mg/dL (ref 6–20)
CALCIUM: 8.2 mg/dL — AB (ref 8.9–10.3)
CALCIUM: 8.3 mg/dL — AB (ref 8.9–10.3)
CALCIUM: 8.2 mg/dL — AB (ref 8.9–10.3)
CALCIUM: 8.2 mg/dL — AB (ref 8.9–10.3)
CHLORIDE: 107 mmol/L (ref 101–111)
CO2: 19 mmol/L — ABNORMAL LOW (ref 22–32)
CO2: 17 mmol/L — AB (ref 22–32)
CO2: 19 mmol/L — AB (ref 22–32)
CO2: 18 mmol/L — ABNORMAL LOW (ref 22–32)
CO2: 18 mmol/L — ABNORMAL LOW (ref 22–32)
CREATININE: 0.67 mg/dL (ref 0.44–1.00)
CREATININE: 0.42 mg/dL — AB (ref 0.44–1.00)
Calcium: 8.1 mg/dL — ABNORMAL LOW (ref 8.9–10.3)
Chloride: 108 mmol/L (ref 101–111)
Chloride: 113 mmol/L — ABNORMAL HIGH (ref 101–111)
Chloride: 110 mmol/L (ref 101–111)
Chloride: 108 mmol/L (ref 101–111)
Creatinine, Ser: 0.83 mg/dL (ref 0.44–1.00)
Creatinine, Ser: 0.5 mg/dL (ref 0.44–1.00)
Creatinine, Ser: 0.5 mg/dL (ref 0.44–1.00)
GFR calc Af Amer: >60 mL/min (ref >60)
GFR calc Af Amer: >60 mL/min (ref >60)
GFR calc Af Amer: >60 mL/min (ref >60)
GFR calc Af Amer: >60 mL/min (ref >60)
GFR calc non Af Amer: >60 mL/min (ref >60)
GFR calc non Af Amer: >60 mL/min (ref >60)
GLUCOSE: 230 mg/dL — AB (ref 65–99)
GLUCOSE: 94 mg/dL (ref 65–99)
Glucose, Bld: 123 mg/dL — ABNORMAL HIGH (ref 65–99)
Glucose, Bld: 209 mg/dL — ABNORMAL HIGH (ref 65–99)
Glucose, Bld: 203 mg/dL — ABNORMAL HIGH (ref 65–99)
POTASSIUM: 3.3 mmol/L — AB (ref 3.5–5.1)
Potassium: 3.2 mmol/L — ABNORMAL LOW (ref 3.5–5.1)
Potassium: 3.6 mmol/L (ref 3.5–5.1)
Potassium: 3.9 mmol/L (ref 3.5–5.1)
Potassium: 3.5 mmol/L (ref 3.5–5.1)
SODIUM: 135 mmol/L (ref 135–145)
SODIUM: 133 mmol/L — AB (ref 135–145)
Sodium: 134 mmol/L — ABNORMAL LOW (ref 135–145)
Sodium: 136 mmol/L (ref 135–145)
Sodium: 134 mmol/L — ABNORMAL LOW (ref 135–145)

## 2014-07-25 LAB — TROPONIN I

## 2014-07-25 LAB — CBC
HCT: 32.5 % — ABNORMAL LOW (ref 36.0–46.0)
Hemoglobin: 11.2 g/dL — ABNORMAL LOW (ref 12.0–15.0)
MCH: 30.6 pg (ref 26.0–34.0)
MCHC: 34.5 g/dL (ref 30.0–36.0)
MCV: 88.8 fL (ref 78.0–100.0)
Platelets: 243 10*3/uL (ref 150–400)
RBC: 3.66 MIL/uL — ABNORMAL LOW (ref 3.87–5.11)
RDW: 12.4 % (ref 11.5–15.5)
WBC: 7.3 10*3/uL (ref 4.0–10.5)

## 2014-07-25 LAB — CORTISOL: Cortisol, Plasma: 12.2 ug/dL

## 2014-07-25 LAB — CLOSTRIDIUM DIFFICILE BY PCR: Toxigenic C. Difficile by PCR: NEGATIVE

## 2014-07-25 LAB — CBG MONITORING, ED: GLUCOSE-CAPILLARY: 294 mg/dL — AB (ref 65–99)

## 2014-07-25 MED ORDER — ENOXAPARIN SODIUM 40 MG/0.4ML ~~LOC~~ SOLN: 40.0000 mg | SUBCUTANEOUS | Status: DC

## 2014-07-25 MED ORDER — SACCHAROMYCES BOULARDII 250 MG PO CAPS
250.0000 mg | ORAL_CAPSULE | Freq: Two times a day (BID) | ORAL | Status: DC
  Administered 2014-07-25 – 2014-07-30 (×13): 250 mg via ORAL
  Filled 2014-07-25 (×15): qty 1

## 2014-07-25 MED ORDER — ACETAMINOPHEN 160 MG/5ML PO SOLN: 325.0000 mg | ORAL | Status: DC | PRN

## 2014-07-25 MED ORDER — COLESTIPOL HCL 5 G PO PACK
5.0000 g | PACK | Freq: Two times a day (BID) | ORAL | Status: DC
  Administered 2014-07-25 – 2014-07-26 (×2): 5 g via ORAL
  Filled 2014-07-25 (×5): qty 1

## 2014-07-25 MED ORDER — DIPHENOXYLATE-ATROPINE 2.5-0.025 MG PO TABS
2.0000 | ORAL_TABLET | Freq: Four times a day (QID) | ORAL | Status: DC
  Administered 2014-07-25 – 2014-07-30 (×21): 2 via ORAL
  Filled 2014-07-25 (×19): qty 2
  Filled 2014-07-25 (×2): qty 1
  Filled 2014-07-25: qty 2

## 2014-07-25 MED ORDER — DICYCLOMINE HCL 10 MG PO CAPS
10.0000 mg | ORAL_CAPSULE | Freq: Three times a day (TID) | ORAL | Status: DC
  Administered 2014-07-25 – 2014-07-31 (×23): 10 mg via ORAL
  Filled 2014-07-25 (×30): qty 1

## 2014-07-25 MED ORDER — OXYCODONE HCL 5 MG/5ML PO SOLN
5.0000 mg | ORAL | Status: DC | PRN
  Administered 2014-07-25 – 2014-07-26 (×3): 5 mg via ORAL
  Filled 2014-07-25 (×3): qty 5

## 2014-07-25 MED ORDER — FAMOTIDINE IN NACL 20-0.9 MG/50ML-% IV SOLN
20.0000 mg | Freq: Two times a day (BID) | INTRAVENOUS | Status: DC
  Administered 2014-07-25 – 2014-07-28 (×6): 20 mg via INTRAVENOUS
  Filled 2014-07-25 (×7): qty 50

## 2014-07-25 MED ORDER — POTASSIUM CHLORIDE CRYS ER 20 MEQ PO TBCR
40.0000 meq | EXTENDED_RELEASE_TABLET | ORAL | Status: AC
  Administered 2014-07-25 (×2): 40 meq via ORAL
  Filled 2014-07-25 (×2): qty 2

## 2014-07-25 MED ORDER — OXYCODONE-ACETAMINOPHEN 5-325 MG PO TABS
1.0000 | ORAL_TABLET | ORAL | Status: DC | PRN
  Administered 2014-07-25 (×4): 1 via ORAL
  Filled 2014-07-25 (×4): qty 1

## 2014-07-25 MED ORDER — KETOROLAC TROMETHAMINE 15 MG/ML IJ SOLN
15.0000 mg | Freq: Once | INTRAMUSCULAR | Status: DC
  Filled 2014-07-25: qty 1

## 2014-07-25 MED ORDER — METOCLOPRAMIDE HCL 10 MG PO TABS
10.0000 mg | ORAL_TABLET | Freq: Three times a day (TID) | ORAL | Status: DC
  Administered 2014-07-25 (×2): 10 mg via ORAL
  Filled 2014-07-25 (×5): qty 1

## 2014-07-25 MED ORDER — LOPERAMIDE HCL 2 MG PO CAPS
2.0000 mg | ORAL_CAPSULE | ORAL | Status: DC | PRN
  Administered 2014-07-26: 2 mg via ORAL
  Filled 2014-07-25: qty 1

## 2014-07-25 MED ORDER — TAB-A-VITE/IRON PO TABS
1.0000 | ORAL_TABLET | Freq: Every day | ORAL | Status: DC
  Administered 2014-07-25 – 2014-07-30 (×6): 1 via ORAL
  Filled 2014-07-25 (×7): qty 1

## 2014-07-25 MED ORDER — SODIUM CHLORIDE 0.9 % IV BOLUS (SEPSIS)
1000.0000 mL | Freq: Once | INTRAVENOUS | Status: AC
  Administered 2014-07-25: 1000 mL via INTRAVENOUS

## 2014-07-25 MED ORDER — AMITRIPTYLINE HCL 100 MG PO TABS
100.0000 mg | ORAL_TABLET | Freq: Every day | ORAL | Status: DC
  Administered 2014-07-25 – 2014-07-30 (×7): 100 mg via ORAL
  Filled 2014-07-25 (×8): qty 1

## 2014-07-25 MED ORDER — POTASSIUM CHLORIDE 10 MEQ/100ML IV SOLN
10.0000 meq | INTRAVENOUS | Status: DC
  Administered 2014-07-25: 10 meq via INTRAVENOUS
  Filled 2014-07-25 (×4): qty 100

## 2014-07-25 MED ORDER — GABAPENTIN 300 MG PO CAPS
300.0000 mg | ORAL_CAPSULE | Freq: Every day | ORAL | Status: DC
  Administered 2014-07-25 – 2014-07-26 (×3): 300 mg via ORAL
  Filled 2014-07-25 (×4): qty 1

## 2014-07-25 MED ORDER — PROMETHAZINE HCL 25 MG/ML IJ SOLN
12.5000 mg | Freq: Once | INTRAMUSCULAR | Status: AC
  Administered 2014-07-25: 12.5 mg via INTRAVENOUS
  Filled 2014-07-25: qty 1

## 2014-07-25 MED ORDER — DEXTROSE-NACL 5-0.45 % IV SOLN
INTRAVENOUS | Status: DC
  Administered 2014-07-25: 03:00:00 via INTRAVENOUS

## 2014-07-25 MED ORDER — PANCRELIPASE (LIP-PROT-AMYL) 12000-38000 UNITS PO CPEP
24000.0000 [IU] | ORAL_CAPSULE | Freq: Three times a day (TID) | ORAL | Status: DC
  Administered 2014-07-25 – 2014-07-29 (×13): 24000 [IU] via ORAL
  Filled 2014-07-25 (×16): qty 2

## 2014-07-25 MED ORDER — METOCLOPRAMIDE HCL 5 MG PO TABS
5.0000 mg | ORAL_TABLET | Freq: Three times a day (TID) | ORAL | Status: DC
  Administered 2014-07-25 – 2014-07-26 (×4): 5 mg via ORAL
  Filled 2014-07-25 (×7): qty 1

## 2014-07-25 MED ORDER — GLUCERNA SHAKE PO LIQD
237.0000 mL | Freq: Three times a day (TID) | ORAL | Status: DC
  Administered 2014-07-25 – 2014-07-26 (×2): 237 mL via ORAL
  Filled 2014-07-25 (×3): qty 237

## 2014-07-25 MED ORDER — SODIUM CHLORIDE 0.9 % IV SOLN
INTRAVENOUS | Status: DC
  Administered 2014-07-25: 7.5 [IU]/h via INTRAVENOUS
  Filled 2014-07-25: qty 2.5

## 2014-07-25 MED ORDER — ENOXAPARIN SODIUM 30 MG/0.3ML ~~LOC~~ SOLN
30.0000 mg | Freq: Every day | SUBCUTANEOUS | Status: DC
  Administered 2014-07-25 – 2014-08-01 (×8): 30 mg via SUBCUTANEOUS
  Filled 2014-07-25 (×8): qty 0.3

## 2014-07-25 MED ORDER — GI COCKTAIL ~~LOC~~
30.0000 mL | Freq: Once | ORAL | Status: AC
  Administered 2014-07-25: 30 mL via ORAL
  Filled 2014-07-25: qty 30

## 2014-07-25 MED ORDER — KETOROLAC TROMETHAMINE 30 MG/ML IJ SOLN
30.0000 mg | Freq: Three times a day (TID) | INTRAMUSCULAR | Status: AC
  Administered 2014-07-26 (×2): 30 mg via INTRAVENOUS
  Filled 2014-07-25 (×4): qty 1

## 2014-07-25 MED ORDER — ALPRAZOLAM 0.25 MG PO TABS
0.2500 mg | ORAL_TABLET | Freq: Two times a day (BID) | ORAL | Status: DC | PRN
  Administered 2014-07-25 – 2014-07-31 (×12): 0.25 mg via ORAL
  Filled 2014-07-25 (×14): qty 1

## 2014-07-25 MED ORDER — ACETAMINOPHEN 160 MG/5ML PO SOLN
650.0000 mg | Freq: Once | ORAL | Status: AC
  Administered 2014-07-25: 650 mg via ORAL
  Filled 2014-07-25: qty 20.3

## 2014-07-25 MED ORDER — SODIUM CHLORIDE 0.9 % IV SOLN
INTRAVENOUS | Status: DC
  Administered 2014-07-25: 01:00:00 via INTRAVENOUS

## 2014-07-25 MED ORDER — OXYCODONE HCL 5 MG/5ML PO SOLN
10.0000 mg | Freq: Once | ORAL | Status: AC
  Administered 2014-07-25: 10 mg via ORAL
  Filled 2014-07-25: qty 10

## 2014-07-25 MED ORDER — HYOSCYAMINE SULFATE 0.125 MG SL SUBL
0.2500 mg | SUBLINGUAL_TABLET | Freq: Once | SUBLINGUAL | Status: AC
  Administered 2014-07-25: 0.25 mg via SUBLINGUAL
  Filled 2014-07-25: qty 2

## 2014-07-25 MED ORDER — ONDANSETRON HCL 4 MG PO TABS
4.0000 mg | ORAL_TABLET | ORAL | Status: DC | PRN
  Administered 2014-07-25 – 2014-07-26 (×4): 4 mg via ORAL
  Filled 2014-07-25 (×4): qty 1

## 2014-07-25 MED ORDER — INSULIN GLARGINE 100 UNIT/ML ~~LOC~~ SOLN
15.0000 [IU] | Freq: Every day | SUBCUTANEOUS | Status: DC
  Administered 2014-07-25 – 2014-07-29 (×5): 15 [IU] via SUBCUTANEOUS
  Filled 2014-07-25 (×5): qty 0.15

## 2014-07-25 MED ORDER — INSULIN ASPART 100 UNIT/ML ~~LOC~~ SOLN
0.0000 [IU] | SUBCUTANEOUS | Status: DC
  Administered 2014-07-25 – 2014-07-26 (×2): 3 [IU] via SUBCUTANEOUS

## 2014-07-25 MED ORDER — METRONIDAZOLE 500 MG PO TABS
500.0000 mg | ORAL_TABLET | Freq: Two times a day (BID) | ORAL | Status: DC
  Administered 2014-07-25 – 2014-07-26 (×5): 500 mg via ORAL
  Filled 2014-07-25 (×7): qty 1

## 2014-07-25 NOTE — Progress Notes (Signed)
MD notified, pt requesting IV pain medication, specifically dilaudid. Per MD due to low BP dilaudid is contraindicated at this time. Pt aware. States she received half a dose in ED rather than full dose. RN advised patient we will have to recheck BP. Toradol ordered, pt refused states "it makes my skin crawl." Will continue to monitor patient.

## 2014-07-25 NOTE — Progress Notes (Signed)
Glucostabilizer has had the insulin drip at a rate of 0 for two hours in a row. MD notified.

## 2014-07-25 NOTE — Progress Notes (Signed)
Brief Rx note:  Lovenox Rx adjusted Lovenox to 30mg  daily in pt with wt <45 kg and CrCl~62 ml/min  Thanks Dorrene German 07/25/2014 12:57 AM

## 2014-07-25 NOTE — Progress Notes (Signed)
MD notified of last 5 CBGs. Patient requesting phenergan and PO potassium, as she can't tolerate the IV K. Dr. Sheran Fava notified.

## 2014-07-25 NOTE — Progress Notes (Addendum)
[facility] PROGRESS NOTE  [name] SVX:[id number] DOB: Nov 12, 1980 DOA: 07/24/2014 PCP: [name], MD  Assessment/Plan  DKA, type 1, gap closed and bicarb has remained mildly low throughout the day which i believe is due to her ongoing diarrhea -  Transition to lantus 15 units with low dose SSI  -  Start carb modified diet -  Last A1c 11.8 less than 3 months ago  . Diabetic gastroparesis -continue Reglan  . Abdominal pain patient with chronic abdominal pain.  -  Start liquid oxycodone  -  Start famotidine BID -  Add toradol x 3 doses  . Diarrhea - patient has been admitted with prior ongoing diarrhea of unclear etiology. Will need careful monitoring of true months of diarrhea and GI consult tomorrow morning given prior history of C. Difficile will obtain C. difficile PCR. Currently patient on Flagyl possibly for C. difficile treatment although patient has tested negative while hospitalized 1 month ago.  -  C. Diff negative again -  Patient going to be seeing outpatient GI MD in a few days -  Resume lomotil -  I will reduce her reglan slightly since she is having fast gastric emptying at this time  Severe protein calorie malnutrition -  Carb modified diet -  glucerna  Code Status: full Family Communication: patient and her mother and daughter Disposition Plan: pending tolerating diet, improvement in diarrhea   Consultants:  Diabetic educator  Procedures:  none  Antibiotics:  Flagyl    HPI/Subjective:  Still having severe abdominal pain.  Denies nausea but having frequent watery diarrhea, no blood.      Objective: Filed Vitals:   07/25/14 0858 07/25/14 1100 07/25/14 1241 07/25/14 1401  BP: 92/60 89/61 83/57  92/62  Pulse:    77  Temp:    98.3 F (36.8 C)  TempSrc:    Oral  Resp:    20  Height:    5\' 5"  (1.651 m)  Weight:    44.1 kg (97 lb 3.6 oz)  SpO2:    100%    Intake/Output Summary (Last 24 hours) at 07/25/14 1720 Last data filed at  07/25/14 1447  Gross per 24 hour  Intake   1625 ml  Output      0 ml  Net   1625 ml   Filed Weights   07/25/14 1401  Weight: 44.1 kg (97 lb 3.6 oz)    Exam:   General:  Thin female, No acute distress  HEENT:  NCAT, MMM, dark circles under eyes  Cardiovascular:  RRR, nl S1, S2 no mrg, 2+ pulses, warm extremities  Respiratory:  CTAB, no increased WOB  Abdomen:   NABS, soft, scaphoid, ND, mild TTP diffusely without rebound or guarding  MSK:   Normal tone and bulk, no LEE  Neuro:  Grossly intact  Data Reviewed: Basic Metabolic Panel:  Recent Labs Lab 07/24/14 1932 07/25/14 0156 07/25/14 0515 07/25/14 0844 07/25/14 1335  NA 130* 134* 136 135 134*  K 4.5 3.3* 3.2* 3.6 3.9  CL 99* 108 113* 110 107  CO2 11* 19* 17* 19* 18*  GLUCOSE 361* 230* 123* 94 209*  BUN 22* 18 15 13 12   CREATININE 0.85 0.83 0.67 0.42* 0.50  CALCIUM 8.8* 8.2* 8.3* 8.2* 8.1*   Liver Function Tests:  Recent Labs Lab 07/21/14 2141 07/24/14 1932  AST 23 20  ALT 30 31  ALKPHOS 69 72  BILITOT 0.4 1.1  PROT 8.3* 8.0  ALBUMIN 5.1* 4.8    Recent Labs  Lab 07/21/14 2141 07/24/14 1932  LIPASE 10* <10*   No results for input(s): AMMONIA in the last 168 hours. CBC:  Recent Labs Lab 07/21/14 2141 07/24/14 1932 07/25/14 0156  WBC 6.5 7.8 7.3  NEUTROABS 3.3 5.5  --   HGB 13.8 12.7 11.2*  HCT 40.5 38.5 32.5*  MCV 90.0 88.7 88.8  PLT 304 248 243   Cardiac Enzymes:  Recent Labs Lab 07/25/14 0844 07/25/14 1335  TROPONINI <0.03 <0.03   BNP (last 3 results) No results for input(s): BNP in the last 8760 hours.  ProBNP (last 3 results) No results for input(s): PROBNP in the last 8760 hours.  CBG:  Recent Labs Lab 07/25/14 1257 07/25/14 1349 07/25/14 1507 07/25/14 1558 07/25/14 1707  GLUCAP 158* 191* 217* 211* 182*    Recent Results (from the past 240 hour(s))  Urine culture     Status: None   Collection Time: 07/18/14 10:37 AM  Result Value Ref Range Status    Specimen Description URINE, CLEAN CATCH  Final   Special Requests NONE  Final   Colony Count   Final    15,000 COLONIES/ML Performed at [facility]    Culture   Final    Multiple bacterial morphotypes present, none predominant. Suggest appropriate recollection if clinically indicated. Performed at [facility]    Report Status 07/19/2014 FINAL  Final  Clostridium Difficile by PCR     Status: None   Collection Time: 07/25/14  8:21 AM  Result Value Ref Range Status   C difficile by pcr NEGATIVE NEGATIVE Final     Studies: No results found.  Scheduled Meds: . amitriptyline  100 mg Oral QHS  . colestipol  5 g Oral BID WC  . dicyclomine  10 mg Oral TID AC & HS  . diphenoxylate-atropine  2 tablet Oral QID  . enoxaparin (LOVENOX) injection  30 mg Subcutaneous Daily  . famotidine (PEPCID) IV  20 mg Intravenous Q12H  . feeding supplement (GLUCERNA SHAKE)  237 mL Oral TID BM  . gabapentin  300 mg Oral QHS  . gi cocktail  30 mL Oral Once  . insulin aspart  0-9 Units Subcutaneous 6 times per day  . insulin glargine  15 Units Subcutaneous QHS  . ketorolac  30 mg Intravenous 3 times per day  . lipase/protease/amylase  24,000 Units Oral TID AC  . metoCLOPramide  10 mg Oral TID WC & HS  . metroNIDAZOLE  500 mg Oral BID  . multivitamins with iron  1 tablet Oral Daily  . saccharomyces boulardii  250 mg Oral BID   Continuous Infusions: . insulin (NOVOLIN-R) infusion 3.1 Units/hr (07/25/14 1507)    Active Problems:   DKA, type 1   DKA (diabetic ketoacidoses)   Diarrhea   Abdominal pain   Diabetic gastroparesis    Time spent: 30 min    Yul Diana, [facility] Pager [phone]. If 7PM-7AM, please contact night-coverage at [email or web address], password [facility] 07/25/2014, 5:20 PM  LOS: 1 day

## 2014-07-25 NOTE — Progress Notes (Signed)
Inpatient Diabetes Program Recommendations  AACE/ADA: New Consensus Statement on Inpatient Glycemic Control (2013)  Target Ranges:  Prepandial:   less than 140 mg/dL      Peak postprandial:   less than 180 mg/dL (1-2 hours)      Critically ill patients:  140 - 180 mg/dL   Reason for Assessment: DKA  Diabetes history: DM1 Outpatient Diabetes medications: Lantus 15 units QAM, Novolog 0-20 tidwc Current orders for Inpatient glycemic control: IV insulin per GlucoStabilizer  AG - 9 CO2 - 18  Ready to transition to SQ insulin.  Inpatient Diabetes Program Recommendations Insulin - Basal: Give Lantus 15 units 2 hours prior to discontinuation of insulin drip. Correction (SSI): Novolog sensitive Q4H x 12, then tidwc and hs Insulin - Meal Coverage: Novolog 3 units tidwc if pt eats > 50% meal HgbA1C: Need updated HgbA1C Diet: When advanced, CHO mod med  Note: Will talk with pt in am.  Thank you. Lorenda Peck, RD, LDN, CDE Inpatient Diabetes Coordinator [phone]

## 2014-07-26 LAB — CBC
HCT: 32.8 % — ABNORMAL LOW (ref 36.0–46.0)
Hemoglobin: 11 g/dL — ABNORMAL LOW (ref 12.0–15.0)
MCH: 30.2 pg (ref 26.0–34.0)
MCHC: 33.5 g/dL (ref 30.0–36.0)
MCV: 90.1 fL (ref 78.0–100.0)
Platelets: 198 10*3/uL (ref 150–400)
RBC: 3.64 MIL/uL — ABNORMAL LOW (ref 3.87–5.11)
RDW: 12.8 % (ref 11.5–15.5)
WBC: 5.3 10*3/uL (ref 4.0–10.5)

## 2014-07-26 LAB — GLUCOSE, CAPILLARY
GLUCOSE-CAPILLARY: 353 mg/dL — AB (ref 65–99)
GLUCOSE-CAPILLARY: 216 mg/dL — AB (ref 65–99)
GLUCOSE-CAPILLARY: 59 mg/dL — AB (ref 65–99)
GLUCOSE-CAPILLARY: 106 mg/dL — AB (ref 65–99)
GLUCOSE-CAPILLARY: 277 mg/dL — AB (ref 65–99)
Glucose-Capillary: 178 mg/dL — ABNORMAL HIGH (ref 65–99)
Glucose-Capillary: 289 mg/dL — ABNORMAL HIGH (ref 65–99)
Glucose-Capillary: 409 mg/dL — ABNORMAL HIGH (ref 65–99)
Glucose-Capillary: 75 mg/dL (ref 65–99)

## 2014-07-26 LAB — BASIC METABOLIC PANEL
Anion gap: 6 (ref 5–15)
BUN: 6 mg/dL (ref 6–20)
CALCIUM: 8.7 mg/dL — AB (ref 8.9–10.3)
CO2: 20 mmol/L — ABNORMAL LOW (ref 22–32)
Chloride: 112 mmol/L — ABNORMAL HIGH (ref 101–111)
Creatinine, Ser: 0.63 mg/dL (ref 0.44–1.00)
GFR calc Af Amer: >60 mL/min (ref >60)
GFR calc non Af Amer: >60 mL/min (ref >60)
Glucose, Bld: 169 mg/dL — ABNORMAL HIGH (ref 65–99)
Potassium: 3.8 mmol/L (ref 3.5–5.1)
Sodium: 138 mmol/L (ref 135–145)

## 2014-07-26 LAB — FECAL LACTOFERRIN, QUANT: Fecal Lactoferrin: NEGATIVE

## 2014-07-26 MED ORDER — INSULIN ASPART 100 UNIT/ML ~~LOC~~ SOLN
8.0000 [IU] | Freq: Once | SUBCUTANEOUS | Status: AC
  Administered 2014-07-26: 8 [IU] via SUBCUTANEOUS

## 2014-07-26 MED ORDER — OXYCODONE HCL 20 MG/ML PO CONC
15.0000 mg | ORAL | Status: DC | PRN
  Administered 2014-07-26 – 2014-07-29 (×16): 15 mg via ORAL
  Filled 2014-07-26 (×16): qty 1

## 2014-07-26 MED ORDER — GLUCERNA SHAKE PO LIQD
237.0000 mL | Freq: Every day | ORAL | Status: DC
Start: 2014-07-26 — End: 2014-07-27
  Administered 2014-07-26 – 2014-07-27 (×5): 237 mL via ORAL
  Filled 2014-07-26 (×8): qty 237

## 2014-07-26 MED ORDER — LOPERAMIDE HCL 2 MG PO CAPS
2.0000 mg | ORAL_CAPSULE | Freq: Three times a day (TID) | ORAL | Status: DC
  Administered 2014-07-26 – 2014-08-01 (×22): 2 mg via ORAL
  Filled 2014-07-26 (×34): qty 1

## 2014-07-26 MED ORDER — OXYCODONE HCL 20 MG/ML PO CONC
10.0000 mg | ORAL | Status: DC | PRN
  Administered 2014-07-26: 10 mg via ORAL
  Filled 2014-07-26: qty 1

## 2014-07-26 MED ORDER — IBUPROFEN 100 MG/5ML PO SUSP
400.0000 mg | Freq: Three times a day (TID) | ORAL | Status: DC
  Administered 2014-07-26 – 2014-07-30 (×13): 400 mg via ORAL
  Filled 2014-07-26 (×17): qty 20

## 2014-07-26 MED ORDER — GERHARDT'S BUTT CREAM
TOPICAL_CREAM | Freq: Two times a day (BID) | CUTANEOUS | Status: DC
  Administered 2014-07-26 – 2014-07-29 (×7): via TOPICAL
  Administered 2014-07-30: 1 via TOPICAL
  Administered 2014-07-30 – 2014-07-31 (×2): via TOPICAL
  Administered 2014-07-31: 1 via TOPICAL
  Administered 2014-08-01: 10:00:00 via TOPICAL
  Filled 2014-07-26: qty 1

## 2014-07-26 MED ORDER — ACETAMINOPHEN 160 MG/5ML PO SOLN
650.0000 mg | Freq: Three times a day (TID) | ORAL | Status: DC
  Administered 2014-07-26 – 2014-07-30 (×13): 650 mg via ORAL
  Filled 2014-07-26 (×13): qty 20.3

## 2014-07-26 MED ORDER — INSULIN ASPART 100 UNIT/ML ~~LOC~~ SOLN
0.0000 [IU] | Freq: Three times a day (TID) | SUBCUTANEOUS | Status: DC
  Administered 2014-07-26 – 2014-07-27 (×2): 5 [IU] via SUBCUTANEOUS
  Administered 2014-07-27: 1 [IU] via SUBCUTANEOUS
  Administered 2014-07-28: 9 [IU] via SUBCUTANEOUS
  Administered 2014-07-28 – 2014-07-29 (×2): 3 [IU] via SUBCUTANEOUS
  Administered 2014-07-29: 1 [IU] via SUBCUTANEOUS
  Administered 2014-07-30 (×2): 2 [IU] via SUBCUTANEOUS
  Administered 2014-07-31: 5 [IU] via SUBCUTANEOUS
  Administered 2014-07-31 (×2): 2 [IU] via SUBCUTANEOUS

## 2014-07-26 MED ORDER — INSULIN ASPART 100 UNIT/ML ~~LOC~~ SOLN
0.0000 [IU] | Freq: Every day | SUBCUTANEOUS | Status: DC
  Administered 2014-07-31: 3 [IU] via SUBCUTANEOUS

## 2014-07-26 NOTE — Progress Notes (Signed)
Pt's Mom came to nursing station to ask to speak with "head of hospital". As charge RN, I asked what was going on. She stated that the her daughter had unrelieved pain and her diarrhea had not improved. She stated that dilaudid that was given in the ED relieved her pain. She also stated that her daughter was ready to "give up". Paged Dr. Sheran Fava. She increased her pain medicine, consulted GI again and consulted psychiatry.  Dr. Sheran Fava increased her pain medicine but did not order dilaudid due to soft BP.  She also discontinued reglan at noon and educated patient that it would take time to take effect.

## 2014-07-26 NOTE — Progress Notes (Signed)
[facility] PROGRESS NOTE  [name] BOF:[id number] DOB: April 29, 1980 DOA: 07/24/2014 PCP: [name], MD  Assessment/Plan  DKA, type 1, gap closed and bicarb has remained mildly low throughout the day which I believe is due to her ongoing diarrhea, labile CBG I believe was due to an extra dose of aspart which was ordered by the covering provider over night.   -  continue lantus 15 units with low dose SSI  -  Last A1c 11.8 less than 3 months ago  Diarrhea, having small volumes of mixed soft and liquid stool -  Continue flagyl -  She was not able to afford rivaroxiban -  C. Diff negative again -  Patient going to be seeing outpatient GI MD in a few days -  Resume lomotil at higher dose and add scheduled imodium -  Will try stopping her reglan and cholestyramine to see if that may help -  Also, her cholestyramine may be contributing to malabsorption and worsening malnutrition -  Patient reporting that she is having increased frequency of stools this evening >  GI consultation called.  They will see tomorrow  Chronic abdominal pain -  D/c liquid oxycodone/APAP and try concentrated oxycodone oral solution -  Start famotidine BID -  S/p toradol x 3 doses -  Start liquid ibuprofen and tylenol, scheduled -  Continue k-pad -  Patient's mother and patient are requesting IV dilaudid, however, I told them that 1. She is tolerating PO, 2. This pain is chronic, 3. Her blood pressure is too low and this medication may cause harm by lowering her BP further, and 4. IV narcotics do not work as long and therefore lead to rebound pain.  -  She needs to be entered into a pain contract at a pain clinic   Diabetic gastroparesis - trial off Reglan since this did not appear to help her symptoms and may be increasing her motility  Severe protein calorie malnutrition -  Carb modified diet -  glucerna  Tearful and feels hopeless, I agree that she likely has some depression contributing to her  chronic pain -  Psychiatry consultation placed  Code Status: full Family Communication: patient and her mother and daughter Disposition Plan: pending tolerating diet, improvement in diarrhea   Consultants:  Diabetic educator  Procedures:  none  Antibiotics:  Flagyl    HPI/Subjective:  States she still has abdominal pain  Denies nausea but having frequent watery diarrhea, no blood.      Objective: Filed Vitals:   07/25/14 1401 07/25/14 2046 07/26/14 0604 07/26/14 1613  BP: 92/62 93/59 89/66  97/68  Pulse: 77 98 80 99  Temp: 98.3 F (36.8 C) 98.5 F (36.9 C) 97.7 F (36.5 C) 99.1 F (37.3 C)  TempSrc: Oral Oral Oral Oral  Resp: 20 18 18 20   Height: 5\' 5"  (1.651 m)     Weight: 44.1 kg (97 lb 3.6 oz)     SpO2: 100% 97% 100% 100%   No intake or output data in the 24 hours ending 07/26/14 1719 Filed Weights   07/25/14 1401  Weight: 44.1 kg (97 lb 3.6 oz)    Exam:   General:  Thin female, No acute distress  HEENT:  NCAT, MMM, dark circles under eyes  Cardiovascular:  RRR, nl S1, S2 no mrg, 2+ pulses, warm extremities  Respiratory:  CTAB, no increased WOB  Abdomen:   NABS, soft, scaphoid, ND, mild TTP diffusely without rebound or guarding, distractible exam with conversation  MSK:  Normal tone and bulk, no LEE  Neuro:  Grossly intact  Data Reviewed: Basic Metabolic Panel:  Recent Labs Lab 07/25/14 0515 07/25/14 0844 07/25/14 1335 07/25/14 1650 07/26/14 0520  NA 136 135 134* 133* 138  K 3.2* 3.6 3.9 3.5 3.8  CL 113* 110 107 108 112*  CO2 17* 19* 18* 18* 20*  GLUCOSE 123* 94 209* 203* 169*  BUN 15 13 12 10 6   CREATININE 0.67 0.42* 0.50 0.50 0.63  CALCIUM 8.3* 8.2* 8.1* 8.2* 8.7*   Liver Function Tests:  Recent Labs Lab 07/21/14 2141 07/24/14 1932  AST 23 20  ALT 30 31  ALKPHOS 69 72  BILITOT 0.4 1.1  PROT 8.3* 8.0  ALBUMIN 5.1* 4.8    Recent Labs Lab 07/21/14 2141 07/24/14 1932  LIPASE 10* <10*   No results for input(s):  AMMONIA in the last 168 hours. CBC:  Recent Labs Lab 07/21/14 2141 07/24/14 1932 07/25/14 0156 07/26/14 0520  WBC 6.5 7.8 7.3 5.3  NEUTROABS 3.3 5.5  --   --   HGB 13.8 12.7 11.2* 11.0*  HCT 40.5 38.5 32.5* 32.8*  MCV 90.0 88.7 88.8 90.1  PLT 304 248 243 198   Cardiac Enzymes:  Recent Labs Lab 07/25/14 0844 07/25/14 1335  TROPONINI <0.03 <0.03   BNP (last 3 results) No results for input(s): BNP in the last 8760 hours.  ProBNP (last 3 results) No results for input(s): PROBNP in the last 8760 hours.  CBG:  Recent Labs Lab 07/26/14 0112 07/26/14 0357 07/26/14 0716 07/26/14 0742 07/26/14 1137  GLUCAP 353* 216* 59* 106* 289*    Recent Results (from the past 240 hour(s))  Urine culture     Status: None   Collection Time: 07/18/14 10:37 AM  Result Value Ref Range Status   Specimen Description URINE, CLEAN CATCH  Final   Special Requests NONE  Final   Colony Count   Final    15,000 COLONIES/ML Performed at [facility]    Culture   Final    Multiple bacterial morphotypes present, none predominant. Suggest appropriate recollection if clinically indicated. Performed at [facility]    Report Status 07/19/2014 FINAL  Final  Clostridium Difficile by PCR     Status: None   Collection Time: 07/25/14  8:21 AM  Result Value Ref Range Status   C difficile by pcr NEGATIVE NEGATIVE Final  Stool culture     Status: None (Preliminary result)   Collection Time: 07/25/14  8:21 AM  Result Value Ref Range Status   Specimen Description STOOL  Final   Special Requests Normal  Final   Culture   Final    Culture reincubated for better growth Performed at [facility]    Report Status PENDING  Incomplete     Studies: No results found.  Scheduled Meds: . acetaminophen (TYLENOL) oral liquid 160 mg/5 mL  650 mg Oral TID  . amitriptyline  100 mg Oral QHS  . dicyclomine  10 mg Oral TID AC & HS  . diphenoxylate-atropine  2 tablet Oral QID  .  enoxaparin (LOVENOX) injection  30 mg Subcutaneous Daily  . famotidine (PEPCID) IV  20 mg Intravenous Q12H  . feeding supplement (GLUCERNA SHAKE)  237 mL Oral 6 X Daily  . gabapentin  300 mg Oral QHS  . Gerhardt's butt cream   Topical BID  . ibuprofen  400 mg Oral TID  . insulin aspart  0-5 Units Subcutaneous QHS  . insulin aspart  0-9  Units Subcutaneous TID WC  . insulin glargine  15 Units Subcutaneous QHS  . ketorolac  30 mg Intravenous Q8H  . lipase/protease/amylase  24,000 Units Oral TID AC  . loperamide  2 mg Oral TID WC & HS  . metroNIDAZOLE  500 mg Oral BID  . multivitamins with iron  1 tablet Oral Daily  . saccharomyces boulardii  250 mg Oral BID   Continuous Infusions:    Active Problems:   DKA, type 1   DKA (diabetic ketoacidoses)   Diarrhea   Abdominal pain   Diabetic gastroparesis    Time spent: 30 min    Desirie Minteer, [place] [facility] Pager [phone]. If 7PM-7AM, please contact night-coverage at [email or web address], password [facility] 07/26/2014, 5:19 PM  LOS: 2 days

## 2014-07-26 NOTE — Progress Notes (Signed)
Initial Nutrition Assessment  DOCUMENTATION CODES:  Underweight, Severe malnutrition in context of chronic illness  INTERVENTION: - Will increase Glucerna Shake order to 6x/day - RD will continue to monitor for needs  NUTRITION DIAGNOSIS:  Increased nutrient needs related to altered GI function, chronic illness, vomiting as evidenced by estimated needs.  GOAL:  Patient will meet greater than or equal to 90% of their needs  MONITOR:  PO intake, Supplement acceptance, Weight trends, Labs, I & O's  REASON FOR ASSESSMENT:  Other (Comment) (Underweight BMI)  ASSESSMENT: Per H&P, patient has frequent bouts of abdominal pain sometimes associated with DKA. Patient states that she started to have abdominal pain since today (6/1). Patient states her diarrhea is less frequent but with significant urgency. She is currently on Flagyl it's unclear what has been restarted. Patient was admitted in the beginning of May with abdominal pain and diarrhea. She undergone flexible sigmoidoscopy on May 2 showed no pseudo-membranous colitis. Patient's medical course had been complicated by poor compliance. During her past admission she was negative for C. difficile negative celiac sprue workup as well. Patient was claiming to have 2-3 L of diarrhea output per day she had a rectal tube placed to monitor this and was found to have only 700 mL of soft stool produced. After after that patient was able to be discharged home.  Pt seen for underweight BMI. She was eating lunch at time of visit: broth, Reuben, cottage cheese, pudding, jello and reports she had this same thing for breakfast plus 2 helpings of eggs. Pt reports that she eats constantly; either meals or snacks. She states she is continuously up to the bathroom after meals and wears diapers at home because she "messes" during the night when she is sleeping. Pt's mother indicates that pt will vomit what appears to be undigested food and this has been going  on for the past few days with severe abdominal pain. Pt sometimes feels nauseous when eating.  Mother reports that pt weighed 135 lbs in December and that she recently gained weight from 95 lbs to 97 lbs. Per weight hx review, pt has lost 25 lbs (20.5% body weight) in 6 months which is significant for time frame. Severe muscle and fat wasting noted during physical exam. Pt states has been drinking Glucerna constantly and would like it increased to 6x/day so she can have them over night as well.   Unsure at this time if pt is meeting needs with no intakes documented. Based on notes about noncompliance and interaction with pt, question possible psych component exacerbating current issues and reports by pt.   Pt's mother asked about protein sources appropriate for pt with hx of C. Diff and gastroparesis, recommended that pt consume a source of protein at all meals and snacks. Stated that the source did not matter but, rather, to focus on what did not aggravate pt's stomach.  Medications reviewed. Labs reviewed; CBGs: 59-409 mg/dL, Cl: 112 mmol/L.  Height:  Ht Readings from Last 1 Encounters:  07/25/14 5\' 5"  (1.651 m)    Weight:  Wt Readings from Last 1 Encounters:  07/25/14 97 lb 3.6 oz (44.1 kg)    Ideal Body Weight:  56.8 kg (kg)  Wt Readings from Last 10 Encounters:  07/25/14 97 lb 3.6 oz (44.1 kg)  07/21/14 93 lb (42.185 kg)  07/06/14 98 lb 1.7 oz (44.5 kg)  06/11/14 95 lb (43.092 kg)  05/28/14 99 lb 3.3 oz (45 kg)  05/09/14 100 lb (45.36 kg)  04/02/14 110 lb (49.896 kg)  03/30/14 101 lb 13.6 oz (46.2 kg)  03/25/14 106 lb (48.081 kg)  03/05/14 122 lb 9.2 oz (55.6 kg)    BMI:  Body mass index is 16.18 kg/(m^2).  Estimated Nutritional Needs:  Kcal:  1900-2100  Protein:  85-100 grams  Fluid:  2.5-3 L/day  Skin:  Reviewed, no issues  Diet Order:  Diet Carb Modified Fluid consistency:: Thin; Room service appropriate?: Yes  EDUCATION NEEDS:  No education needs  identified at this time   Intake/Output Summary (Last 24 hours) at 07/26/14 1301 Last data filed at 07/25/14 1447  Gross per 24 hour  Intake   1625 ml  Output      0 ml  Net   1625 ml    Last BM:  PTA   Jarome Matin, RD, LDN Inpatient Clinical Dietitian Pager # [phone] After hours/weekend pager # [phone]

## 2014-07-26 NOTE — Progress Notes (Signed)
Inpatient Diabetes Program Recommendations  AACE/ADA: New Consensus Statement on Inpatient Glycemic Control (2013)  Target Ranges:  Prepandial:   less than 140 mg/dL      Peak postprandial:   less than 180 mg/dL (1-2 hours)      Critically ill patients:  140 - 180 mg/dL   Reason for Assessment: DKA  **Reviewed Dr. Arman Filter office visit note (patient's Endocrinologist with [facility]- patient saw Dr. Cruzita Lederer on 05/09/14). Per Dr. Arman Filter notes, patient is supposed to take Lantus 15 units QAM + Novolog 1 unit for every 100 mg/dl above target CBG of 100 mg/dl + Novolog 1 unit for every 15 grams of carbohydrates consumed by patient.  Results for [name], [name] (MRN [id number]) as of 07/26/2014 13:24  Ref. Range 07/26/2014 01:12 07/26/2014 03:57 07/26/2014 07:16 07/26/2014 07:42 07/26/2014 11:37  Glucose-Capillary Latest Ref Range: 65-99 mg/dL 353 (H) 216 (H) 59 (L) 106 (H) 289 (H)   Hypoglycemia this am. Needs meal coverage with elevated post-prandial blood sugar.  Recommendations: Continue Lantus 15 units Q24H Add Novolog 4 units tidwc for meal coverage insulin. Decrease Novolog to moderate tidwc and hs.  Will follow. Thank you. Lorenda Peck, RD, LDN, CDE Inpatient Diabetes Coordinator [phone]

## 2014-07-27 DIAGNOSIS — F4323 Adjustment disorder with mixed anxiety and depressed mood: Secondary | ICD-10-CM

## 2014-07-27 DIAGNOSIS — R197 Diarrhea, unspecified: Secondary | ICD-10-CM

## 2014-07-27 LAB — GLUCOSE, CAPILLARY
GLUCOSE-CAPILLARY: 209 mg/dL — AB (ref 65–99)
GLUCOSE-CAPILLARY: 50 mg/dL — AB (ref 65–99)
GLUCOSE-CAPILLARY: 71 mg/dL (ref 65–99)
GLUCOSE-CAPILLARY: 300 mg/dL — AB (ref 65–99)
Glucose-Capillary: 108 mg/dL — ABNORMAL HIGH (ref 65–99)
Glucose-Capillary: 148 mg/dL — ABNORMAL HIGH (ref 65–99)
Glucose-Capillary: 263 mg/dL — ABNORMAL HIGH (ref 65–99)

## 2014-07-27 LAB — BASIC METABOLIC PANEL
ANION GAP: 8 (ref 5–15)
BUN: 6 mg/dL (ref 6–20)
CO2: 21 mmol/L — ABNORMAL LOW (ref 22–32)
Calcium: 8.7 mg/dL — ABNORMAL LOW (ref 8.9–10.3)
Chloride: 110 mmol/L (ref 101–111)
Creatinine, Ser: 0.52 mg/dL (ref 0.44–1.00)
GFR calc non Af Amer: >60 mL/min (ref >60)
Glucose, Bld: 164 mg/dL — ABNORMAL HIGH (ref 65–99)
Potassium: 3.9 mmol/L (ref 3.5–5.1)
SODIUM: 139 mmol/L (ref 135–145)

## 2014-07-27 LAB — CBC
HEMATOCRIT: 31.1 % — AB (ref 36.0–46.0)
HEMOGLOBIN: 10.6 g/dL — AB (ref 12.0–15.0)
MCH: 30.4 pg (ref 26.0–34.0)
MCHC: 34.1 g/dL (ref 30.0–36.0)
MCV: 89.1 fL (ref 78.0–100.0)
Platelets: 175 10*3/uL (ref 150–400)
RBC: 3.49 MIL/uL — AB (ref 3.87–5.11)
RDW: 13 % (ref 11.5–15.5)
WBC: 4.5 10*3/uL (ref 4.0–10.5)

## 2014-07-27 MED ORDER — GABAPENTIN 300 MG PO CAPS
300.0000 mg | ORAL_CAPSULE | Freq: Three times a day (TID) | ORAL | Status: DC
  Administered 2014-07-27 – 2014-07-30 (×10): 300 mg via ORAL
  Filled 2014-07-27 (×13): qty 1

## 2014-07-27 MED ORDER — CHOLESTYRAMINE LIGHT 4 G PO PACK
4.0000 g | PACK | Freq: Two times a day (BID) | ORAL | Status: DC
  Administered 2014-07-27 – 2014-07-29 (×6): 4 g via ORAL
  Filled 2014-07-27 (×8): qty 1

## 2014-07-27 MED ORDER — PROMETHAZINE HCL 25 MG PO TABS
12.5000 mg | ORAL_TABLET | Freq: Four times a day (QID) | ORAL | Status: DC | PRN
  Administered 2014-07-27 – 2014-07-28 (×2): 12.5 mg via ORAL
  Filled 2014-07-27 (×2): qty 1

## 2014-07-27 NOTE — Progress Notes (Signed)
[facility] PROGRESS NOTE  [name] PXT:[id number] DOB: 07/09/1980 DOA: 07/24/2014 PCP: [name], MD  Brief Summary  The patient is a 34 year old female with type 1 diabetes mellitus with recurrent diabetic ketoacidosis, chronic abdominal pain and frequent stools, gastroparesis who was recently hospitalized in May 2016 with diarrhea with abdominal pain. At that time she underwent EGD and flexible sigmoidoscopy which were unremarkable. Her C. difficile PCR was negative. Her celiac sprue workup was negative. She had recently been treated for C. difficile colitis but she was PCR negative. She had further studies for laxative abuse neg, stool electrolytes (nondiagnostic), 24-hour urine for 5 HIAA (normal), VIP wnl.  There was concern for fictitious diarrhea and objective measurement with rectal tube demonstrated much more formed and much smaller volume of stool than patient had been reporting.  She was readmitted with the same plus DKA.  Since admission, she has had a very flat affect, been tearful and she has been seen by psychiatry who recommended outpatient counseling.  She has been repeatedly asking for IV phenergan and IV dilaudid.  See below.  GI has been consulted again to again address her diarrhea.    Assessment/Plan  DKA, type 1, gap closed and bicarb stable.  Labile CBG.   -   continue lantus 15 units with low dose SSI  -  Last A1c 11.8 less than 3 months ago  Diarrhea, having small volumes of mixed soft and liquid stool.  She has previously been tested for infectious diarrhea,  -  Continue flagyl -  She was not able to afford rivaroxiban -  C. Diff negative again -  Continue lomotil and increase imodium -  Continue to hold reglan  -  Cholestyramine resumed by GI -  Upper GI ordered -  Appreciate GI assistance  Chronic abdominal pain -  Continue concentrated oxycodone oral solution >> do NOT increase or add IV narcotics please -  continue famotidine BID -  S/p  toradol x 3 doses -  continue liquid ibuprofen and tylenol, scheduled -  Continue k-pad -  Patient's mother and patient are requesting IV dilaudid, however, I told them that 1. She is tolerating PO, 2. This pain is chronic, 3. Her blood pressure is too low and this medication may cause harm by lowering her BP further, and 4. IV narcotics do not work as long and therefore lead to rebound pain.  -  She needs to be entered into a pain contract at a pain clinic   Diabetic gastroparesis - trial off Reglan since this did not appear to help her symptoms and may be increasing her motility  Severe protein calorie malnutrition -  Carb modified diet -  glucerna  Tearful and feels hopeless, I agree that she likely has some depression contributing to her chronic pain -  Psychiatry consultation appreciated  Normocytic anemia.  Iron studies, vitamin B12, TSH recently wnl.   -  folate -  Repeat hgb in AM  Code Status: full Family Communication: patient alone Disposition Plan: pending any additional work up by GI.  Currently, she is tolerating PO and oral pain medications.    Consultants:  Diabetic educator  Gastroenterology, Dr. Olevia Perches  Psychiatry, Dr. Corena Pilgrim  Procedures:  none  Antibiotics:  Flagyl    HPI/Subjective:  States she still has severe abdominal pain.   Denies nausea.  Stools are less frequent overnight and between meals now.  Asking if her gap has closed (it did several days ago).  Objective: Filed Vitals:   07/26/14 1613 07/26/14 2030 07/27/14 0454 07/27/14 1500  BP: 97/68 103/67 88/56 103/69  Pulse: 99 94 83 103  Temp: 99.1 F (37.3 C) 98.2 F (36.8 C) 98.2 F (36.8 C) 98.3 F (36.8 C)  TempSrc: Oral Oral Oral Oral  Resp: 20 18 16 18   Height:      Weight:      SpO2: 100% 99% 98% 100%    Intake/Output Summary (Last 24 hours) at 07/27/14 1628 Last data filed at 07/27/14 1230  Gross per 24 hour  Intake    630 ml  Output      0 ml  Net    630  ml   Filed Weights   07/25/14 1401  Weight: 44.1 kg (97 lb 3.6 oz)    Exam:   General:  Thin female, No acute distress, flat affect  HEENT:  NCAT, MMM, dark circles under eyes  Cardiovascular:  RRR, nl S1, S2 no mrg, 2+ pulses, warm extremities  Respiratory:  CTAB, no increased WOB  Abdomen:   NABS, soft, scaphoid, ND, mild TTP diffusely without rebound or guarding, distractible exam with conversation  MSK:   Normal tone and bulk, no LEE  Neuro:  Grossly intact  Data Reviewed: Basic Metabolic Panel:  Recent Labs Lab 07/25/14 0844 07/25/14 1335 07/25/14 1650 07/26/14 0520 07/27/14 0525  NA 135 134* 133* 138 139  K 3.6 3.9 3.5 3.8 3.9  CL 110 107 108 112* 110  CO2 19* 18* 18* 20* 21*  GLUCOSE 94 209* 203* 169* 164*  BUN 13 12 10 6 6   CREATININE 0.42* 0.50 0.50 0.63 0.52  CALCIUM 8.2* 8.1* 8.2* 8.7* 8.7*   Liver Function Tests:  Recent Labs Lab 07/21/14 2141 07/24/14 1932  AST 23 20  ALT 30 31  ALKPHOS 69 72  BILITOT 0.4 1.1  PROT 8.3* 8.0  ALBUMIN 5.1* 4.8    Recent Labs Lab 07/21/14 2141 07/24/14 1932  LIPASE 10* <10*   No results for input(s): AMMONIA in the last 168 hours. CBC:  Recent Labs Lab 07/21/14 2141 07/24/14 1932 07/25/14 0156 07/26/14 0520 07/27/14 0703  WBC 6.5 7.8 7.3 5.3 4.5  NEUTROABS 3.3 5.5  --   --   --   HGB 13.8 12.7 11.2* 11.0* 10.6*  HCT 40.5 38.5 32.5* 32.8* 31.1*  MCV 90.0 88.7 88.8 90.1 89.1  PLT 304 248 243 198 175   Cardiac Enzymes:  Recent Labs Lab 07/25/14 0844 07/25/14 1335  TROPONINI <0.03 <0.03   BNP (last 3 results) No results for input(s): BNP in the last 8760 hours.  ProBNP (last 3 results) No results for input(s): PROBNP in the last 8760 hours.  CBG:  Recent Labs Lab 07/27/14 0139 07/27/14 0731 07/27/14 0813 07/27/14 0852 07/27/14 1156  GLUCAP 209* 108* 50* 71 148*    Recent Results (from the past 240 hour(s))  Urine culture     Status: None   Collection Time: 07/18/14 10:37  AM  Result Value Ref Range Status   Specimen Description URINE, CLEAN CATCH  Final   Special Requests NONE  Final   Colony Count   Final    15,000 COLONIES/ML Performed at [facility]    Culture   Final    Multiple bacterial morphotypes present, none predominant. Suggest appropriate recollection if clinically indicated. Performed at [facility]    Report Status 07/19/2014 FINAL  Final  Clostridium Difficile by PCR     Status: None  Collection Time: 07/25/14  8:21 AM  Result Value Ref Range Status   C difficile by pcr NEGATIVE NEGATIVE Final  Stool culture     Status: None (Preliminary result)   Collection Time: 07/25/14  8:21 AM  Result Value Ref Range Status   Specimen Description STOOL  Final   Special Requests Normal  Final   Culture   Final    Culture reincubated for better growth Performed at [facility]    Report Status PENDING  Incomplete     Studies: No results found.  Scheduled Meds: . acetaminophen (TYLENOL) oral liquid 160 mg/5 mL  650 mg Oral TID  . amitriptyline  100 mg Oral QHS  . cholestyramine light  4 g Oral BID  . dicyclomine  10 mg Oral TID AC & HS  . diphenoxylate-atropine  2 tablet Oral QID  . enoxaparin (LOVENOX) injection  30 mg Subcutaneous Daily  . famotidine (PEPCID) IV  20 mg Intravenous Q12H  . gabapentin  300 mg Oral QHS  . Gerhardt's butt cream   Topical BID  . ibuprofen  400 mg Oral TID  . insulin aspart  0-5 Units Subcutaneous QHS  . insulin aspart  0-9 Units Subcutaneous TID WC  . insulin glargine  15 Units Subcutaneous QHS  . lipase/protease/amylase  24,000 Units Oral TID AC  . loperamide  2 mg Oral TID WC & HS  . multivitamins with iron  1 tablet Oral Daily  . saccharomyces boulardii  250 mg Oral BID   Continuous Infusions:    Active Problems:   DKA, type 1   DKA (diabetic ketoacidoses)   Diarrhea   Abdominal pain   Diabetic gastroparesis   Adjustment disorder with mixed anxiety and depressed  mood    Time spent: 30 min    Nydia Ytuarte, [place] [facility] Pager [phone]. If 7PM-7AM, please contact night-coverage at [email or web address], password [facility] 07/27/2014, 4:28 PM  LOS: 3 days

## 2014-07-27 NOTE — Consult Note (Signed)
[facility] Consult   Reason for Consult:  Depression and anxiety Referring Physician:  EDP Patient Identification: [name] MRN:  [id number] Principal Diagnosis: Adjustment disorder with mixed anxiety and depressed mood Diagnosis:   Patient Active Problem List   Diagnosis Date Noted  . Adjustment disorder with mixed anxiety and depressed mood [F43.23] 07/27/2014    Priority: Medium  . Diabetic gastroparesis [E11.43]   . Hypoglycemia due to type 1 diabetes mellitus [E10.649]   . Abdominal pain [R10.9]   . Anemia of chronic disease [D63.8]   . Personal history of noncompliance with medical treatment [Z91.19] 06/22/2014  . Generalized abdominal pain [R10.84]   . Metabolic acidosis [E52.7] 06/20/2014  . Diarrhea [R19.7] 06/19/2014  . Nausea vomiting and diarrhea [R11.2, R19.7] 06/19/2014  . Protein-calorie malnutrition, severe [E43] 05/31/2014  . C. difficile colitis [A04.7] 05/29/2014  . DKA (diabetic ketoacidoses) [E13.10] 03/30/2014  . Diabetic gastroparesis associated with type 1 diabetes mellitus [E10.43, K31.84] 03/03/2014  . Hypocalcemia [E83.51] 01/29/2013  . Sepsis [A41.9] 01/29/2013  . Hyperglycemia [R73.9] 05/12/2012  . DM (diabetes mellitus), type 1, uncontrolled [E10.65] 05/12/2012  . GERD (gastroesophageal reflux disease) [K21.9]   . DM gastroparesis [E11.43]   . Thyroid disease [E07.9]   . UTI (lower urinary tract infection) [N39.0] 02/04/2012  . 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 DKA, type 1 [E10.10] 10/20/2011  . Nausea and vomiting [R11.2] 04/20/2011    Total Time spent with patient: 45 minutes  Subjective:   [name] is a 34 y.o. female patient admitted with Diabetic keto acidosis.  HPI: [name] is a 34 year-old female who denies past medical history of mental illness but she reports past medical history of IDDM type 1, C. difficile colitis, chronic back pain, gastroparesis who presents the ER with hyperglycemia.  Patient reports experiencing elevated blood sugar, generalized malaise, nausea, vomiting, generalized abdominal pain prior to admission. She reports that she has had multiple DKAs since December, 2015 after she was diagnosed with C. difficile colitis. Patient reports that she has been feeling overwhelmed, frustrated, stressed out, anxious and depressed due to recurrent diarrhea. Today, patient denies feeling depressed, suicidal, delusional or psychotic.   HPI Elements:   Location:  anxious. Quality:  moderate. Duration:  since December, 2015. Context:  medical issues.  Past Medical History:  Past Medical History  Diagnosis Date  . DM type 1 causing complication dx 7824    hyperglycemia +/- DKA + coma, severe hypoglycemia, gastroparesis, peripheral neuropathy  . Thyroid disease     hypothyroidism associated with pregnancy  . Back pain   . PONV (postoperative nausea and vomiting)   . Anxiety     hx panic attacks.   Marland Kitchen GERD (gastroesophageal reflux disease)   . DM gastroparesis   . Anemia     .Autoimmune hemolytic anemia. bone marrow bx 2008, Dr Marin Olp  . Colitis, Clostridium difficile 05/2014  . Fatty liver 01/2007    with hepatomegaly on CT scan.     Past Surgical History  Procedure Laterality Date  . Laparoscopy  02/2002, 06/2010    laparoscopy with lysis pelvic adhesions for pelvic pain  . Back surgery      age 5  . Tooth extraction    . Root canal  10/10/12  . Flexible sigmoidoscopy N/A 06/24/2014    Procedure: FLEXIBLE SIGMOIDOSCOPY;  Surgeon: Milus Banister, MD;  Location: [facility];  Service: Endoscopy;  Laterality: N/A;  . Esophagogastroduodenoscopy (egd) with propofol N/A 06/27/2014    Procedure: ESOPHAGOGASTRODUODENOSCOPY (  EGD) WITH PROPOFOL;  Surgeon: Milus Banister, MD;  Location: [facility];  Service: Endoscopy;  Laterality: N/A;   Family History:  Family History  Problem Relation Age of Onset  . Diabetes Maternal Grandmother   . Cancer Maternal Grandmother      Leukemia  . Cancer Maternal Grandfather     Small cell lung cancer   Social History:  History  Alcohol Use  . Yes    Comment: RARE SIPS OF WINE     History  Drug Use No    History   Social History  . Marital Status: Single    Spouse Name: N/A  . Number of Children: N/A  . Years of Education: N/A   Social History Main Topics  . Smoking status: Former Smoker    Quit date: 04/19/2000  . Smokeless tobacco: Former Systems developer  . Alcohol Use: Yes     Comment: RARE SIPS OF WINE  . Drug Use: No  . Sexual Activity: No   Other Topics Concern  . None   Social History Narrative   Single.  Lives with children.   Additional Social History:                          Allergies:   Allergies  Allergen Reactions  . Scallops [Shellfish Allergy] Anaphylaxis  . Morphine And Related Itching  . Latex Rash  . Other Rash    sunscreen    Labs:  Results for orders placed or performed during the hospital encounter of 07/24/14 (from the past 48 hour(s))  Glucose, capillary     Status: Abnormal   Collection Time: 07/25/14  3:07 PM  Result Value Ref Range   Glucose-Capillary 217 (H) 65 - 99 mg/dL  Glucose, capillary     Status: Abnormal   Collection Time: 07/25/14  3:58 PM  Result Value Ref Range   Glucose-Capillary 211 (H) 65 - 99 mg/dL  Basic metabolic panel (stat then every 4 hours)     Status: Abnormal   Collection Time: 07/25/14  4:50 PM  Result Value Ref Range   Sodium 133 (L) 135 - 145 mmol/L   Potassium 3.5 3.5 - 5.1 mmol/L   Chloride 108 101 - 111 mmol/L   CO2 18 (L) 22 - 32 mmol/L   Glucose, Bld 203 (H) 65 - 99 mg/dL   BUN 10 6 - 20 mg/dL   Creatinine, Ser 0.50 0.44 - 1.00 mg/dL   Calcium 8.2 (L) 8.9 - 10.3 mg/dL   GFR calc non Af Amer >60 >60 mL/min   GFR calc Af Amer >60 >60 mL/min    Comment: (NOTE) The eGFR has been calculated using the CKD EPI equation. This calculation has not been validated in all clinical situations. eGFR's persistently <60 mL/min  signify possible Chronic Kidney Disease.    Anion gap 7 5 - 15  Glucose, capillary     Status: Abnormal   Collection Time: 07/25/14  5:07 PM  Result Value Ref Range   Glucose-Capillary 182 (H) 65 - 99 mg/dL  Glucose, capillary     Status: Abnormal   Collection Time: 07/25/14  8:37 PM  Result Value Ref Range   Glucose-Capillary 224 (H) 65 - 99 mg/dL   Comment 1 Notify RN   Glucose, capillary     Status: Abnormal   Collection Time: 07/26/14 12:05 AM  Result Value Ref Range   Glucose-Capillary 409 (H) 65 - 99 mg/dL  Glucose, capillary  Status: Abnormal   Collection Time: 07/26/14  1:12 AM  Result Value Ref Range   Glucose-Capillary 353 (H) 65 - 99 mg/dL  Glucose, capillary     Status: Abnormal   Collection Time: 07/26/14  3:57 AM  Result Value Ref Range   Glucose-Capillary 216 (H) 65 - 99 mg/dL  Basic metabolic panel     Status: Abnormal   Collection Time: 07/26/14  5:20 AM  Result Value Ref Range   Sodium 138 135 - 145 mmol/L   Potassium 3.8 3.5 - 5.1 mmol/L   Chloride 112 (H) 101 - 111 mmol/L   CO2 20 (L) 22 - 32 mmol/L   Glucose, Bld 169 (H) 65 - 99 mg/dL   BUN 6 6 - 20 mg/dL   Creatinine, Ser 0.63 0.44 - 1.00 mg/dL   Calcium 8.7 (L) 8.9 - 10.3 mg/dL   GFR calc non Af Amer >60 >60 mL/min   GFR calc Af Amer >60 >60 mL/min    Comment: (NOTE) The eGFR has been calculated using the CKD EPI equation. This calculation has not been validated in all clinical situations. eGFR's persistently <60 mL/min signify possible Chronic Kidney Disease.    Anion gap 6 5 - 15  CBC     Status: Abnormal   Collection Time: 07/26/14  5:20 AM  Result Value Ref Range   WBC 5.3 4.0 - 10.5 K/uL   RBC 3.64 (L) 3.87 - 5.11 MIL/uL   Hemoglobin 11.0 (L) 12.0 - 15.0 g/dL   HCT 32.8 (L) 36.0 - 46.0 %   MCV 90.1 78.0 - 100.0 fL   MCH 30.2 26.0 - 34.0 pg   MCHC 33.5 30.0 - 36.0 g/dL   RDW 12.8 11.5 - 15.5 %   Platelets 198 150 - 400 K/uL  Glucose, capillary     Status: Abnormal   Collection  Time: 07/26/14  7:16 AM  Result Value Ref Range   Glucose-Capillary 59 (L) 65 - 99 mg/dL   Comment 1 Notify RN   Glucose, capillary     Status: Abnormal   Collection Time: 07/26/14  7:42 AM  Result Value Ref Range   Glucose-Capillary 106 (H) 65 - 99 mg/dL  Glucose, capillary     Status: Abnormal   Collection Time: 07/26/14 11:37 AM  Result Value Ref Range   Glucose-Capillary 289 (H) 65 - 99 mg/dL  Glucose, capillary     Status: None   Collection Time: 07/26/14  5:28 PM  Result Value Ref Range   Glucose-Capillary 75 65 - 99 mg/dL  Glucose, capillary     Status: Abnormal   Collection Time: 07/26/14  6:02 PM  Result Value Ref Range   Glucose-Capillary 178 (H) 65 - 99 mg/dL  Glucose, capillary     Status: Abnormal   Collection Time: 07/26/14  8:33 PM  Result Value Ref Range   Glucose-Capillary 277 (H) 65 - 99 mg/dL  Glucose, capillary     Status: Abnormal   Collection Time: 07/27/14  1:39 AM  Result Value Ref Range   Glucose-Capillary 209 (H) 65 - 99 mg/dL  Basic metabolic panel     Status: Abnormal   Collection Time: 07/27/14  5:25 AM  Result Value Ref Range   Sodium 139 135 - 145 mmol/L   Potassium 3.9 3.5 - 5.1 mmol/L   Chloride 110 101 - 111 mmol/L   CO2 21 (L) 22 - 32 mmol/L   Glucose, Bld 164 (H) 65 - 99 mg/dL   BUN 6 6 -  20 mg/dL   Creatinine, Ser 0.52 0.44 - 1.00 mg/dL   Calcium 8.7 (L) 8.9 - 10.3 mg/dL   GFR calc non Af Amer >60 >60 mL/min   GFR calc Af Amer >60 >60 mL/min    Comment: (NOTE) The eGFR has been calculated using the CKD EPI equation. This calculation has not been validated in all clinical situations. eGFR's persistently <60 mL/min signify possible Chronic Kidney Disease.    Anion gap 8 5 - 15  CBC     Status: Abnormal   Collection Time: 07/27/14  7:03 AM  Result Value Ref Range   WBC 4.5 4.0 - 10.5 K/uL   RBC 3.49 (L) 3.87 - 5.11 MIL/uL   Hemoglobin 10.6 (L) 12.0 - 15.0 g/dL   HCT 31.1 (L) 36.0 - 46.0 %   MCV 89.1 78.0 - 100.0 fL   MCH 30.4  26.0 - 34.0 pg   MCHC 34.1 30.0 - 36.0 g/dL   RDW 13.0 11.5 - 15.5 %   Platelets 175 150 - 400 K/uL  Glucose, capillary     Status: Abnormal   Collection Time: 07/27/14  7:31 AM  Result Value Ref Range   Glucose-Capillary 108 (H) 65 - 99 mg/dL  Glucose, capillary     Status: Abnormal   Collection Time: 07/27/14  8:13 AM  Result Value Ref Range   Glucose-Capillary 50 (L) 65 - 99 mg/dL   Comment 1 Notify RN   Glucose, capillary     Status: None   Collection Time: 07/27/14  8:52 AM  Result Value Ref Range   Glucose-Capillary 71 65 - 99 mg/dL  Glucose, capillary     Status: Abnormal   Collection Time: 07/27/14 11:56 AM  Result Value Ref Range   Glucose-Capillary 148 (H) 65 - 99 mg/dL   Comment 1 Notify RN     Vitals: Blood pressure 88/56, pulse 83, temperature 98.2 F (36.8 C), temperature source Oral, resp. rate 16, height 5' 5" (1.651 m), weight 44.1 kg (97 lb 3.6 oz), last menstrual period 01/22/2014, SpO2 98 %.  Risk to Self: Is patient at risk for suicide?: No Risk to Others:   Prior Inpatient Therapy:   Prior Outpatient Therapy:    Current Facility-Administered Medications  Medication Dose Route Frequency Provider Last Rate Last Dose  . acetaminophen (TYLENOL) solution 650 mg  650 mg Oral TID Janece Canterbury, MD   650 mg at 07/27/14 1055  . ALPRAZolam Duanne Moron) tablet 0.25 mg  0.25 mg Oral BID PRN Toy Baker, MD   0.25 mg at 07/27/14 0443  . amitriptyline (ELAVIL) tablet 100 mg  100 mg Oral QHS Toy Baker, MD   100 mg at 07/26/14 2205  . cholestyramine light (PREVALITE) packet 4 g  4 g Oral BID Lafayette Dragon, MD   4 g at 07/27/14 1255  . dicyclomine (BENTYL) capsule 10 mg  10 mg Oral TID AC & HS Toy Baker, MD   10 mg at 07/27/14 1255  . diphenoxylate-atropine (LOMOTIL) 2.5-0.025 MG per tablet 2 tablet  2 tablet Oral QID Janece Canterbury, MD   2 tablet at 07/27/14 1055  . enoxaparin (LOVENOX) injection 30 mg  30 mg Subcutaneous Daily Toy Baker,  MD   30 mg at 07/27/14 1055  . famotidine (PEPCID) IVPB 20 mg premix  20 mg Intravenous Q12H Janece Canterbury, MD   20 mg at 07/27/14 1055  . gabapentin (NEURONTIN) capsule 300 mg  300 mg Oral QHS Toy Baker, MD   300 mg  at 07/26/14 2204  . Gerhardt's butt cream   Topical BID Janece Canterbury, MD      . ibuprofen (ADVIL,MOTRIN) 100 MG/5ML suspension 400 mg  400 mg Oral TID Janece Canterbury, MD   400 mg at 07/27/14 1055  . insulin aspart (novoLOG) injection 0-5 Units  0-5 Units Subcutaneous QHS Janece Canterbury, MD   0 Units at 07/26/14 2200  . insulin aspart (novoLOG) injection 0-9 Units  0-9 Units Subcutaneous TID WC Janece Canterbury, MD   5 Units at 07/26/14 1313  . insulin glargine (LANTUS) injection 15 Units  15 Units Subcutaneous QHS Janece Canterbury, MD   15 Units at 07/26/14 2210  . lipase/protease/amylase (CREON) capsule 24,000 Units  24,000 Units Oral TID AC Toy Baker, MD   24,000 Units at 07/27/14 1255  . loperamide (IMODIUM) capsule 2 mg  2 mg Oral PRN Janece Canterbury, MD   2 mg at 07/26/14 1508  . loperamide (IMODIUM) capsule 2 mg  2 mg Oral TID WC & HS Janece Canterbury, MD   2 mg at 07/27/14 1255  . multivitamins with iron tablet 1 tablet  1 tablet Oral Daily Janece Canterbury, MD   1 tablet at 07/27/14 1055  . ondansetron (ZOFRAN) tablet 4 mg  4 mg Oral Q4H PRN Janece Canterbury, MD   4 mg at 07/26/14 1508  . oxyCODONE (ROXICODONE INTENSOL) 20 MG/ML concentrated solution 15 mg  15 mg Oral Q4H PRN Janece Canterbury, MD   15 mg at 07/27/14 1106  . saccharomyces boulardii (FLORASTOR) capsule 250 mg  250 mg Oral BID Toy Baker, MD   250 mg at 07/27/14 1055    Musculoskeletal: Strength & Muscle Tone: within normal limits Gait & Station: normal Patient leans: N/A  Psychiatric Specialty Exam: Physical Exam  Psychiatric: Her speech is normal and behavior is normal. Judgment and thought content normal. Her mood appears anxious. Cognition and memory are normal. She exhibits a  depressed mood.    Review of Systems  Constitutional: Positive for weight loss and malaise/fatigue.  HENT: Negative.   Eyes: Negative.   Respiratory: Negative.   Cardiovascular: Negative.   Gastrointestinal: Positive for diarrhea.  Genitourinary: Negative.   Musculoskeletal: Negative.   Skin: Negative.   Neurological: Positive for weakness.  Endo/Heme/Allergies: Negative.   Psychiatric/Behavioral: Positive for depression. The patient is nervous/anxious.     Blood pressure 88/56, pulse 83, temperature 98.2 F (36.8 C), temperature source Oral, resp. rate 16, height 5' 5" (1.651 m), weight 44.1 kg (97 lb 3.6 oz), last menstrual period 01/22/2014, SpO2 98 %.Body mass index is 16.18 kg/(m^2).  General Appearance: Casual  Eye Contact::  Good  Speech:  Clear and Coherent  Volume:  Normal  Mood:  Anxious  Affect:  Appropriate  Thought Process:  Goal Directed and Logical  Orientation:  Full (Time, Place, and Person)  Thought Content:  Negative  Suicidal Thoughts:  No  Homicidal Thoughts:  No  Memory:  Immediate;   Good Recent;   Good Remote;   Good  Judgement:  Fair  Insight:  Fair  Psychomotor Activity:  Normal  Concentration:  Fair  Recall:  Good  Fund of Knowledge:Fair  Language: Good  Akathisia:  No  Handed:  Right  AIMS (if indicated):     Assets:  Communication Skills Desire for Improvement Physical Health  ADL's:  Intact  Cognition: WNL  Sleep:   poor   Medical Decision Making: Established Problem, Stable/Improving (1) and Review of Medication Regimen & Side Effects (2)  Treatment  Plan Summary: Daily contact with patient to assess and evaluate symptoms and progress in treatment. Patient will benefit from referral to a therapist for counseling.  Plan:  No evidence of imminent risk to self or others at present.   Patient does not meet criteria for psychiatric inpatient admission. Supportive therapy provided about ongoing stressors.   Disposition: Patient is  cleared by psychiatric service and can be discharged when medical cleared.  Kerrion Kemppainen,MD 07/27/2014 3:01 PM

## 2014-07-27 NOTE — Consult Note (Addendum)
Consultation   History of Present Illness:   HPI: [name] is a 34 y.o. female *with history of type 1 diabetes, gastroparesis, chronic kidney disease, admitted with diarrhea.Seen in consult on 06/22/2014. December she's been plagued with diarrhea. In early April stools were positive for C. difficile toxin and she was placed on Flagyl. While diarrhea improved somewhat it never entirely dissipated. CT scan in early April, which I reviewed, demonstrated a diffuse sided colitis. Upon discontinuing Flagyl approximately 6 days ago diarrhea has worsened. She was readmitted for diarrhea. She complains of severe urgency and lower abdominal pain. Stools for C. difficile difficile toxin and lactoferrin were negative. as of 07/25/2014 Repeat CT scan demonstrated persistent but improved inflammatory changes in the left colon.Colonoscopy on 06/24/2014 with random biopsies was normal. tTG negative. Frequent stools occur at night as well as during the day.    Past Medical History  Diagnosis Date  . DM type 1 causing complication dx 3299    hyperglycemia +/- DKA + coma, severe hypoglycemia, gastroparesis, peripheral neuropathy  . Thyroid disease     hypothyroidism associated with pregnancy  . Back pain   . PONV (postoperative nausea and vomiting)   . Anxiety     hx panic attacks.   Marland Kitchen GERD (gastroesophageal reflux disease)   . DM gastroparesis   . Anemia     .Autoimmune hemolytic anemia. bone marrow bx 2008, Dr Marin Olp  . Colitis, Clostridium difficile 05/2014  . Fatty liver 01/2007    with hepatomegaly on CT scan.    Past Surgical History  Procedure Laterality Date  . Laparoscopy  02/2002, 06/2010    laparoscopy with lysis pelvic adhesions for pelvic pain  . Back surgery      age 43  . Tooth extraction    . Root canal  10/10/12  . Flexible sigmoidoscopy N/A 06/24/2014    Procedure: FLEXIBLE SIGMOIDOSCOPY;  Surgeon: Milus Banister, MD;  Location: [facility];  Service: Endoscopy;   Laterality: N/A;  . Esophagogastroduodenoscopy (egd) with propofol N/A 06/27/2014    Procedure: ESOPHAGOGASTRODUODENOSCOPY (EGD) WITH PROPOFOL;  Surgeon: Milus Banister, MD;  Location: [facility];  Service: Endoscopy;  Laterality: N/A;    reports that she quit smoking about 14 years ago. She has quit using smokeless tobacco. She reports that she drinks alcohol. She reports that she does not use illicit drugs. family history includes Cancer in her maternal grandfather and maternal grandmother; Diabetes in her maternal grandmother. Allergies  Allergen Reactions  . Scallops [Shellfish Allergy] Anaphylaxis  . Morphine And Related Itching  . Latex Rash  . Other Rash    sunscreen        Review of Systems:  The remainder of the 10 point ROS is negative except as outlined in H&P   Physical Exam: General appearance  thin in no distress. Eyes- non icteric. HEENT nontraumatic, normocephalic. Mouth no lesions, tongue papillated, no cheilosis. Neck supple without adenopathy, thyroid not enlarged, no carotid bruits, no JVD. Lungs Clear to auscultation bilaterally. Cor normal S1, normal S2, regular rhythm, no murmur,  quiet precordium. Abdomen: scaphoid, diffusely tender, normal bowl sounds Rectal:normal rectal sphincter tone, yellow pasty non odorous heme negative stool Extremities no pedal edema. Skin no lesions. Neurological alert and oriented x 3. Psychological normal mood and affect.  Assessment and Plan:  Chronic diarrhea of 6 months duration ,r/o  malabsorption? r/o rapid transit time.  Sprue profile was  negative, we have also ruled out IBD, C.Diff and  microscopic colitis  by performing  colonoscopy.. She has a completely competent rectal sphincter tone. I will add [name] 4gm bid, check stool for qualitative fat, obtain SBFT , hold Glucerna which may cause osmotic diarrhea, check  serum Prealbumin.    07/27/2014 [name]

## 2014-07-28 DIAGNOSIS — E1143 Type 2 diabetes mellitus with diabetic autonomic (poly)neuropathy: Secondary | ICD-10-CM

## 2014-07-28 DIAGNOSIS — E108 Type 1 diabetes mellitus with unspecified complications: Secondary | ICD-10-CM

## 2014-07-28 LAB — CBC
HEMATOCRIT: 32.1 % — AB (ref 36.0–46.0)
Hemoglobin: 10.6 g/dL — ABNORMAL LOW (ref 12.0–15.0)
MCH: 29.9 pg (ref 26.0–34.0)
MCHC: 33 g/dL (ref 30.0–36.0)
MCV: 90.4 fL (ref 78.0–100.0)
PLATELETS: 156 10*3/uL (ref 150–400)
RBC: 3.55 MIL/uL — AB (ref 3.87–5.11)
RDW: 12.9 % (ref 11.5–15.5)
WBC: 6.6 10*3/uL (ref 4.0–10.5)

## 2014-07-28 LAB — GLUCOSE, CAPILLARY
GLUCOSE-CAPILLARY: 110 mg/dL — AB (ref 65–99)
GLUCOSE-CAPILLARY: 105 mg/dL — AB (ref 65–99)
Glucose-Capillary: 220 mg/dL — ABNORMAL HIGH (ref 65–99)
Glucose-Capillary: 43 mg/dL — CL (ref 65–99)
Glucose-Capillary: 371 mg/dL — ABNORMAL HIGH (ref 65–99)
Glucose-Capillary: 418 mg/dL — ABNORMAL HIGH (ref 65–99)

## 2014-07-28 LAB — FOLATE: Folate: 9.6 ng/mL (ref >5.9)

## 2014-07-28 LAB — BASIC METABOLIC PANEL
ANION GAP: 7 (ref 5–15)
BUN: 10 mg/dL (ref 6–20)
CO2: 24 mmol/L (ref 22–32)
Calcium: 8.7 mg/dL — ABNORMAL LOW (ref 8.9–10.3)
Chloride: 107 mmol/L (ref 101–111)
Creatinine, Ser: 0.51 mg/dL (ref 0.44–1.00)
GFR calc non Af Amer: >60 mL/min (ref >60)
GLUCOSE: 281 mg/dL — AB (ref 65–99)
Potassium: 4.3 mmol/L (ref 3.5–5.1)
Sodium: 138 mmol/L (ref 135–145)

## 2014-07-28 LAB — PREALBUMIN: PREALBUMIN: 23.1 mg/dL (ref 18–38)

## 2014-07-28 MED ORDER — INSULIN ASPART 100 UNIT/ML ~~LOC~~ SOLN
7.0000 [IU] | Freq: Once | SUBCUTANEOUS | Status: AC
  Administered 2014-07-28: 4 [IU] via SUBCUTANEOUS

## 2014-07-28 MED ORDER — FAMOTIDINE 20 MG PO TABS
20.0000 mg | ORAL_TABLET | Freq: Two times a day (BID) | ORAL | Status: DC
  Administered 2014-07-29 – 2014-07-30 (×5): 20 mg via ORAL
  Filled 2014-07-28 (×7): qty 1

## 2014-07-28 NOTE — Progress Notes (Signed)
   Subjective  Having abdominal pain, no stool yet today   Objective  Pt being evaluated for "diarrhea", stool for [place] stain has been collected, Glucerna on hold to  avoid possibility of osmotic diarrhea. Small bowl x-ray will be done tomorrow. Questran added.  Vital signs in last 24 hours: Temp:  [98.2 F (36.8 C)-98.7 F (37.1 C)] 98.2 F (36.8 C) (06/05 0508) Pulse Rate:  [84-103] 86 (06/05 0508) Resp:  [18] 18 (06/05 0508) BP: (98-104)/(69-75) 98/75 mmHg (06/05 0508) SpO2:  [100 %] 100 % (06/05 0508) Last BM Date: 07/27/14 (per pt) General:    white female in NAD appears comfortable Heart:  Regular rate and rhythm; no murmurs Lungs: Respirations even and unlabored, lungs CTA bilaterally Abdomen:  Soft, tender diffusely, nondistended. Normal bowel sounds.no tympany Extremities:  Without edema. Neurologic:  Alert and oriented,  grossly normal neurologically. Psych:  Cooperative. Normal mood and affect.  Intake/Output from previous day: 06/04 0701 - 06/05 0700 In: 720 [P.O.:720] Out: -  Intake/Output this shift:    Lab Results:  Recent Labs  07/26/14 0520 07/27/14 0703 07/28/14 0538  WBC 5.3 4.5 6.6  HGB 11.0* 10.6* 10.6*  HCT 32.8* 31.1* 32.1*  PLT 198 175 156   BMET  Recent Labs  07/26/14 0520 07/27/14 0525 07/28/14 0538  NA 138 139 138  K 3.8 3.9 4.3  CL 112* 110 107  CO2 20* 21* 24  GLUCOSE 169* 164* 281*  BUN 6 6 10   CREATININE 0.63 0.52 0.51  CALCIUM 8.7* 8.7* 8.7*   LFT No results for input(s): PROT, ALBUMIN, AST, ALT, ALKPHOS, BILITOT, BILIDIR, IBILI in the last 72 hours. PT/INR No results for input(s): LABPROT, INR in the last 72 hours.  Studies/Results: No results found.     Assessment / Plan:   Diarrhea work up in progress, continue the same, Prealbumin pending ( her serum albumin was normal!), SBFT tomorrow.[facility] Psychiatry following.  Active Problems:   DKA, type 1   DKA (diabetic ketoacidoses)   Diarrhea   Abdominal pain  Diabetic gastroparesis   Adjustment disorder with mixed anxiety and depressed mood     LOS: 4 days   Delfin Edis  07/28/2014, 9:03 AM

## 2014-07-28 NOTE — Progress Notes (Signed)
PROGRESS NOTE  [name] UYQ:[id number] DOB: 11-29-1980 DOA: 07/24/2014 PCP: [name], MD  Summary: 34 year old woman with history of diabetes mellitus type 1, recurrent DKA, chronic abdominal pain, gastroparesis with recent hospitalization May 2016 at which time she underwent EGD and flexible sigmoidoscopy, both unremarkable. C. difficile PCR was negative at that time as well as celiac sprue workup. Recently treated for C. difficile colitis but PCR was negative. Per Dr. Sheran Fava: "She had further studies for laxative abuse neg, stool electrolytes (nondiagnostic), 24-hour urine for 5 HIAA (normal), VIP wnl. There was concern for fictitious diarrhea and objective measurement with rectal tube demonstrated much more formed and much smaller volume of stool than patient had been reporting."  She was readmitted with frequent stools, abdominal pain, DKA. Also seen by GI and psychiatry (recommended outpatient counseling). Patient requesting repeatedly for IV Phenergan and IV Dilaudid per chart .  Assessment/Plan: 1. DKA, diabetes mellitus type 1. Resolved. Continue Lantus, sliding scale insulin. Hemoglobin A1c 11.8 less than 3 months ago. blood sugars overall stable with one low.  2. Small volume diarrhea, chronic. Continue Flagyl. C. difficile negative. Previous evaluation including endoscopy unremarkable, celiac sprue ruled out, microscopic colitis ruled out. Continue Lomotil, Imodium, cholestyramine per GI. 3. Diabetic gastroparesis. 4. Normocytic anemia. Iron studies, vitamin B12, TSH recently within normal limits. 5. Chronic abdominal pain. Continue concentrated oxycodone oral solution. Avoid IV narcotics. Continue famotidine, liquid Tylenol and ibuprofen. Dr. Rise Mu recommendations noted: Patient tolerating by mouth, therefore no IV narcotics. Recommend pain clinic referral as an outpatient. 6. Severe malnutrition per dietitian. Prealbumin within normal limits. 7. Adjustment disorder with mixed  anxiety and depressed mood.psychiatry recommended outpatient referral to a therapist for counseling. Cleared for discharge from a psychiatry standpoint.   Management per GI for diarrhea including Lomotil, Imodium, cholestyramine. Small bowel follow-through planned for tomorrow.   I asked the patient to notify the nurse for all bowel movements as there is discrepancy between patient report and what has been observed by staff. This was an issue on previous hospitalizations documented.  Patient was interviewed, examined and care discussed with the patient and mother with bedside RN Abigail Butts present entire time.  Code Status: full code DVT prophylaxis: Lovenox Family Communication:  Disposition Plan: home  Murray Hodgkins, MD  [facility]  Pager [phone] If 7PM-7AM, please contact night-coverage at [email or web address], password [facility] 07/28/2014, 1:58 PM  LOS: 4 days   Consultants:  Diabetic educator  Gastroenterology, Dr. Olevia Perches  Psychiatry, Dr. Corena Pilgrim  Procedures:  none  Antibiotics:  Flagyl discontinued  HPI/Subjective: Discussed with nursing. One bowel movement reported. No vomiting. Voracious appetite.  Sleeping initially. Mother bedside. Patient reports "10 stools". No vomiting. Tolerating food. Mother inquired for IV narcotics.   Objective: Filed Vitals:   07/27/14 0454 07/27/14 1500 07/27/14 2132 07/28/14 0508  BP: 88/56 103/69 104/72 98/75  Pulse: 83 103 84 86  Temp: 98.2 F (36.8 C) 98.3 F (36.8 C) 98.7 F (37.1 C) 98.2 F (36.8 C)  TempSrc: Oral Oral Oral Oral  Resp: 16 18 18 18   Height:      Weight:      SpO2: 98% 100% 100% 100%    Intake/Output Summary (Last 24 hours) at 07/28/14 1358 Last data filed at 07/28/14 0900  Gross per 24 hour  Intake    480 ml  Output      0 ml  Net    480 ml     Filed Weights   07/25/14 1401  Weight: 44.1 kg (97 lb  3.6 oz)    Exam:     afebrile, vital signs are stable. No hypoxia.  General:  Appears  calm and comfortable Cardiovascular: RRR, no m/r/g.   Respiratory: CTA bilaterally, no w/r/r. Normal respiratory effort. Abdomen: soft, ntnd Psychiatric: grossly normal mood and affect, speech fluent and appropriate  New data reviewed:   One stool documented  one episode of hypoglycemia at lunch. Most recent blood sugar 105.  Basic metabolic panel unremarkable.  Prealbumin 23.1.  Folate within normal limits.  Hemoglobin stable 10.6.  Pertinent data since admission:   Urine pregnancy negative  C. difficile PCR negative  Fecal lactoferrin negative  Pending data:   Stool culture  Scheduled Meds: . acetaminophen (TYLENOL) oral liquid 160 mg/5 mL  650 mg Oral TID  . amitriptyline  100 mg Oral QHS  . cholestyramine light  4 g Oral BID  . dicyclomine  10 mg Oral TID AC & HS  . diphenoxylate-atropine  2 tablet Oral QID  . enoxaparin (LOVENOX) injection  30 mg Subcutaneous Daily  . famotidine (PEPCID) IV  20 mg Intravenous Q12H  . gabapentin  300 mg Oral TID  . Gerhardt's butt cream   Topical BID  . ibuprofen  400 mg Oral TID  . insulin aspart  0-5 Units Subcutaneous QHS  . insulin aspart  0-9 Units Subcutaneous TID WC  . insulin glargine  15 Units Subcutaneous QHS  . lipase/protease/amylase  24,000 Units Oral TID AC  . loperamide  2 mg Oral TID WC & HS  . multivitamins with iron  1 tablet Oral Daily  . saccharomyces boulardii  250 mg Oral BID   Continuous Infusions:   Principal Problem:   Diarrhea Active Problems:   DKA, type 1   DKA (diabetic ketoacidoses)   Abdominal pain   Diabetic gastroparesis   Adjustment disorder with mixed anxiety and depressed mood   Time spent 20 minutes

## 2014-07-29 DIAGNOSIS — R1084 Generalized abdominal pain: Secondary | ICD-10-CM

## 2014-07-29 DIAGNOSIS — R159 Full incontinence of feces: Secondary | ICD-10-CM

## 2014-07-29 LAB — BASIC METABOLIC PANEL
ANION GAP: 8 (ref 5–15)
BUN: 15 mg/dL (ref 6–20)
CO2: 22 mmol/L (ref 22–32)
Calcium: 8.6 mg/dL — ABNORMAL LOW (ref 8.9–10.3)
Chloride: 107 mmol/L (ref 101–111)
Creatinine, Ser: 0.58 mg/dL (ref 0.44–1.00)
GFR calc Af Amer: >60 mL/min (ref >60)
GLUCOSE: 337 mg/dL — AB (ref 65–99)
Potassium: 4 mmol/L (ref 3.5–5.1)
Sodium: 137 mmol/L (ref 135–145)

## 2014-07-29 LAB — FECAL FAT, QUALITATIVE
FAT QUAL TOTAL STL: INCREASED
Fat Qual Neutral, Stl: INCREASED

## 2014-07-29 LAB — STOOL CULTURE: Special Requests: NORMAL

## 2014-07-29 LAB — GLUCOSE, CAPILLARY
GLUCOSE-CAPILLARY: 135 mg/dL — AB (ref 65–99)
GLUCOSE-CAPILLARY: 206 mg/dL — AB (ref 65–99)
GLUCOSE-CAPILLARY: 233 mg/dL — AB (ref 65–99)
Glucose-Capillary: 330 mg/dL — ABNORMAL HIGH (ref 65–99)
Glucose-Capillary: 314 mg/dL — ABNORMAL HIGH (ref 65–99)

## 2014-07-29 MED ORDER — PANCRELIPASE (LIP-PROT-AMYL) 12000-38000 UNITS PO CPEP
36000.0000 [IU] | ORAL_CAPSULE | Freq: Two times a day (BID) | ORAL | Status: DC | PRN
  Filled 2014-07-29: qty 3

## 2014-07-29 MED ORDER — TRAZODONE HCL 50 MG PO TABS
50.0000 mg | ORAL_TABLET | Freq: Every day | ORAL | Status: DC
  Administered 2014-07-29 – 2014-07-30 (×2): 50 mg via ORAL
  Filled 2014-07-29 (×4): qty 1

## 2014-07-29 MED ORDER — OXYCODONE HCL 20 MG/ML PO CONC
20.0000 mg | ORAL | Status: DC | PRN
  Administered 2014-07-29 – 2014-07-31 (×9): 20 mg via ORAL
  Filled 2014-07-29 (×9): qty 1

## 2014-07-29 MED ORDER — PANCRELIPASE (LIP-PROT-AMYL) 12000-38000 UNITS PO CPEP
36000.0000 [IU] | ORAL_CAPSULE | Freq: Three times a day (TID) | ORAL | Status: DC
  Administered 2014-07-29 (×2): 36000 [IU] via ORAL
  Filled 2014-07-29 (×6): qty 3

## 2014-07-29 NOTE — Progress Notes (Signed)
    Progress Note   Subjective  still having diarrhea associated with incontinence during the night   Objective   Vital signs in last 24 hours: Temp:  [98.5 F (36.9 C)-98.8 F (37.1 C)] 98.5 F (36.9 C) (06/06 0621) Pulse Rate:  [53-108] 108 (06/06 0621) Resp:  [16-18] 18 (06/06 0621) BP: (103-107)/(65-73) 105/65 mmHg (06/06 0621) SpO2:  [91 %-100 %] 100 % (06/06 0621) Last BM Date: 07/28/14 General:    Pleasant white female in NAD Heart:  Regular rate and rhythm Lungs: Respirations even and unlabored, lungs CTA bilaterally Abdomen:  Soft, nondistended, mild diffuse lower tenderness. Normal bowel sounds. Extremities:  Without edema. Neurologic:  Alert and oriented,  grossly normal neurologically. Psych:  Cooperative. Normal mood and affect.  Intake/Output from previous day: 06/05 0701 - 06/06 0700 In: 76 [P.O.:880] Out: -  Intake/Output this shift: Total I/O In: -  Out: 800 [Stool:800]  Lab Results:  Recent Labs  07/27/14 0703 07/28/14 0538  WBC 4.5 6.6  HGB 10.6* 10.6*  HCT 31.1* 32.1*  PLT 175 156   BMET  Recent Labs  07/27/14 0525 07/28/14 0538 07/29/14 0505  NA 139 138 137  K 3.9 4.3 4.0  CL 110 107 107  CO2 21* 24 22  GLUCOSE 164* 281* 337*  BUN 6 10 15   CREATININE 0.52 0.51 0.58  CALCIUM 8.7* 8.7* 8.6*      Assessment / Plan:    7. 34 year old female with several month history of diarrhea. C-diff was positive in April but diarrhea persisted despite course of flagyl. Patient hospitalized last month with DKA and persistent diarrhea. An extensive workup was done. Repeat stool studies were negative. TTg, TSH, VIP all normal. Random colon biopsies were negative. Laxative abuse panel negative. Unable to locate the stool osmo as ordered by Dr. Ardis Hughs (will call lab). Her  CTscan did reveal persistent but improved inflammatory changes in left colon (probably from previous c-diff). Patient was treated for small bowel bacterial overgrowth. She was tried  on imodium, lomotil and Questran but still had diarrhea. Of note her fecal elastase was low implying pancreatic insufficiency but no evidence for malnutrition.We did treat empirically with pancreatic enzymes last admission though she wasn't on full dose replacement. She went home on Creon 24000 units with each meal and at bedtime.   Readmitted with severe diarrhea. She describes postprandial diarrhea but also incontinent of voluminous diarrhea several times during the night. Pattern of diarrhea confusing as it has osmotic and secretory characteristics.  Plan: Await stool for fat.  For small bowel follow through in am (she ate today). Continue Questran 4gm BID, florastor, imodium 2mg  TID, lomotil 2 tablets QID, Bentyl 10mg  TID. Continue Creon but increase dose to 36K with each meal and 36K with snacks (BID).  2. Longstanding diabetes     LOS: 5 days   [name]  07/29/2014, 9:43 AM

## 2014-07-29 NOTE — Progress Notes (Signed)
PROGRESS NOTE  [name] GGE:[id number] DOB: [birth year] DOA: 07/24/2014 PCP: [name], MD  Summary: 34 year old woman with history of diabetes mellitus type 1, recurrent DKA, chronic abdominal pain, gastroparesis with recent hospitalization May 2016 at which time she underwent EGD and flexible sigmoidoscopy, both unremarkable. C. difficile PCR was negative at that time as well as celiac sprue workup. Recently treated for C. difficile colitis but PCR was negative. Per Dr. Sheran Fava: "She had further studies for laxative abuse neg, stool electrolytes (nondiagnostic), 24-hour urine for 5 HIAA (normal), VIP wnl. There was concern for fictitious diarrhea and objective measurement with rectal tube demonstrated much more formed and much smaller volume of stool than patient had been reporting."  She was readmitted with frequent stools, abdominal pain, DKA. Also seen by GI and psychiatry (recommended outpatient counseling). Patient requesting repeatedly for IV Phenergan and IV Dilaudid per chart .  Assessment/Plan: 1. Refractory diarrhea, reportedly high volume. C. difficile negative. Previous evaluation including endoscopy unremarkable, celiac sprue ruled out, microscopic colitis ruled out. Continue Lomotil, Imodium, cholestyramine per GI. 2. DKA, diabetes mellitus type 1. Resolved. Continue Lantus, sliding scale insulin. Hemoglobin A1c 11.8 less than 3 months ago. lStable. 3. Diabetic gastroparesis. 4. Normocytic anemia. Stable. Iron studies, vitamin B12, TSH recently within normal limits. 5. Chronic abdominal pain. Continue concentrated oxycodone oral solution. Avoid IV narcotics. Continue famotidine, liquid Tylenol and ibuprofen. Dr. Rise Mu recommendations noted: Patient tolerating by mouth, therefore no IV narcotics. Recommend pain clinic referral as an outpatient. 6. Severe malnutrition per dietitian. Prealbumin within normal limits. 7. Adjustment disorder with mixed anxiety and depressed mood.  Psychiatry recommended outpatient referral to a therapist for counseling. Cleared for discharge from a psychiatry standpoint.   Management per GI for diarrhea including Lomotil, Imodium, cholestyramine. Small bowel follow-through planned for tomorrow, awaiting stool for fat.  Trazodone for sleep  Adjust oral pain medication  Discussed with mother at bedside  Code Status: full code DVT prophylaxis: Lovenox Family Communication:  Disposition Plan: home  Murray Hodgkins, MD  [facility]  Pager [phone] If 7PM-7AM, please contact night-coverage at [email or web address], password [facility] 07/29/2014, 1:51 PM  LOS: 5 days   Consultants:  Diabetic educator  Gastroenterology, Dr. Olevia Perches  Psychiatry, Dr. Corena Pilgrim  Procedures:  none  Antibiotics:  Flagyl discontinued  HPI/Subjective: GI rec SBFT.  Reports numerous watery stools. Ongoing abdominal pain, requesting IV Dilaudid.  Objective: Filed Vitals:   07/28/14 0508 07/28/14 1500 07/28/14 2103 07/29/14 0621  BP: 98/75 107/70 103/73 105/65  Pulse: 86 102 53 108  Temp: 98.2 F (36.8 C) 98.8 F (37.1 C) 98.5 F (36.9 C) 98.5 F (36.9 C)  TempSrc: Oral Oral Oral Oral  Resp: 18 16 18 18   Height:      Weight:      SpO2: 100% 99% 91% 100%    Intake/Output Summary (Last 24 hours) at 07/29/14 1351 Last data filed at 07/29/14 0730  Gross per 24 hour  Intake    400 ml  Output    800 ml  Net   -400 ml     Filed Weights   07/25/14 1401  Weight: 44.1 kg (97 lb 3.6 oz)    Exam:    Afebrile, VSS, no hypoxia General:  Appears comfortable, calm. Cardiovascular: Regular rate and rhythm, no murmur, rub or gallop.  Respiratory: Clear to auscultation bilaterally, no wheezes, rales or rhonchi. Normal respiratory effort. Abdomen: soft, ntnd Psychiatric: grossly normal mood and affect, speech fluent and appropriate  New data reviewed:  Stool output  2400  CBG labile, stable  Basic metabolic panel  unremarkable.  Pertinent data since admission:  Urine pregnancy negative  C. difficile PCR negative  Fecal lactoferrin negative  Stool culture unremarkable  Pending data:    Scheduled Meds: . acetaminophen (TYLENOL) oral liquid 160 mg/5 mL  650 mg Oral TID  . amitriptyline  100 mg Oral QHS  . cholestyramine light  4 g Oral BID  . dicyclomine  10 mg Oral TID AC & HS  . diphenoxylate-atropine  2 tablet Oral QID  . enoxaparin (LOVENOX) injection  30 mg Subcutaneous Daily  . famotidine  20 mg Oral BID  . gabapentin  300 mg Oral TID  . Gerhardt's butt cream   Topical BID  . ibuprofen  400 mg Oral TID  . insulin aspart  0-5 Units Subcutaneous QHS  . insulin aspart  0-9 Units Subcutaneous TID WC  . insulin glargine  15 Units Subcutaneous QHS  . lipase/protease/amylase  36,000 Units Oral TID AC  . loperamide  2 mg Oral TID WC & HS  . multivitamins with iron  1 tablet Oral Daily  . saccharomyces boulardii  250 mg Oral BID   Continuous Infusions:   Principal Problem:   Diarrhea Active Problems:   DKA, type 1   DKA (diabetic ketoacidoses)   Abdominal pain   Diabetic gastroparesis   Adjustment disorder with mixed anxiety and depressed mood   Type 1 diabetes mellitus with complications   Fecal incontinence   Time spent 20 minutes

## 2014-07-29 NOTE — Progress Notes (Signed)
07/29/14 1700  Patient requesting Dilaudid one time dose at night to help rest.  Per Dr Sarajane Jews no Dilaudid will be ordered.  Patient is aware.

## 2014-07-30 ENCOUNTER — Inpatient Hospital Stay (HOSPITAL_COMMUNITY): Payer: Medicaid Other

## 2014-07-30 DIAGNOSIS — K8681 Exocrine pancreatic insufficiency: Secondary | ICD-10-CM

## 2014-07-30 DIAGNOSIS — K909 Intestinal malabsorption, unspecified: Secondary | ICD-10-CM

## 2014-07-30 LAB — BASIC METABOLIC PANEL
Anion gap: 9 (ref 5–15)
BUN: 14 mg/dL (ref 6–20)
CALCIUM: 9.1 mg/dL (ref 8.9–10.3)
CO2: 25 mmol/L (ref 22–32)
CREATININE: 0.66 mg/dL (ref 0.44–1.00)
Chloride: 107 mmol/L (ref 101–111)
GFR calc Af Amer: >60 mL/min (ref >60)
GLUCOSE: 294 mg/dL — AB (ref 65–99)
Potassium: 4.1 mmol/L (ref 3.5–5.1)
SODIUM: 141 mmol/L (ref 135–145)

## 2014-07-30 LAB — GLUCOSE, CAPILLARY
GLUCOSE-CAPILLARY: 193 mg/dL — AB (ref 65–99)
GLUCOSE-CAPILLARY: 48 mg/dL — AB (ref 65–99)
GLUCOSE-CAPILLARY: 123 mg/dL — AB (ref 65–99)
GLUCOSE-CAPILLARY: 72 mg/dL (ref 65–99)
Glucose-Capillary: 212 mg/dL — ABNORMAL HIGH (ref 65–99)
Glucose-Capillary: 57 mg/dL — ABNORMAL LOW (ref 65–99)

## 2014-07-30 MED ORDER — PANCRELIPASE (LIP-PROT-AMYL) 12000-38000 UNITS PO CPEP
72000.0000 [IU] | ORAL_CAPSULE | Freq: Three times a day (TID) | ORAL | Status: DC
  Filled 2014-07-30 (×3): qty 6

## 2014-07-30 MED ORDER — PANCRELIPASE (LIP-PROT-AMYL) 36000-114000 UNITS PO CPEP
72000.0000 [IU] | ORAL_CAPSULE | Freq: Three times a day (TID) | ORAL | Status: DC
  Administered 2014-07-30 – 2014-08-01 (×6): 72000 [IU] via ORAL
  Filled 2014-07-30 (×9): qty 2

## 2014-07-30 MED ORDER — DEXTROSE 50 % IV SOLN
INTRAVENOUS | Status: AC
  Administered 2014-07-30: 50 mL
  Filled 2014-07-30: qty 50

## 2014-07-30 MED ORDER — CHOLESTYRAMINE LIGHT 4 G PO PACK
4.0000 g | PACK | Freq: Two times a day (BID) | ORAL | Status: DC
  Administered 2014-07-30 – 2014-07-31 (×3): 4 g via ORAL
  Filled 2014-07-30 (×5): qty 1

## 2014-07-30 MED ORDER — SODIUM CHLORIDE 0.9 % IV SOLN
INTRAVENOUS | Status: DC
  Administered 2014-07-30: 14:00:00 via INTRAVENOUS

## 2014-07-30 MED ORDER — INSULIN GLARGINE 100 UNIT/ML ~~LOC~~ SOLN
15.0000 [IU] | Freq: Every morning | SUBCUTANEOUS | Status: DC
  Administered 2014-07-31 – 2014-08-01 (×2): 15 [IU] via SUBCUTANEOUS
  Filled 2014-07-30 (×2): qty 0.15

## 2014-07-30 NOTE — Progress Notes (Signed)
[name] had two large amount of loose stools with food particles/ fruits seeds  in it.

## 2014-07-30 NOTE — Progress Notes (Signed)
Hypoglycemic Event  CBG: 43  Treatment: D50 IV 50 mL  Symptoms: Shaky  Follow-up CBG: Time:1030 CBG Result: 123  Possible Reasons for Event: Inadequate meal intake  Comments/MD notified: Goodrich at bedside-note added to sliding scale insulin order     [name] D  Remember to initiate Hypoglycemia Order Set & complete

## 2014-07-30 NOTE — Progress Notes (Addendum)
PROGRESS NOTE  [name] LKG:[id number] DOB: 1980/02/27 DOA: 07/24/2014 PCP: [name], MD  Summary: 34 year old woman with history of diabetes mellitus type 1, recurrent DKA, chronic abdominal pain, gastroparesis with recent hospitalization May 2016 at which time she underwent EGD and flexible sigmoidoscopy, both unremarkable. C. difficile PCR was negative at that time as well as celiac sprue workup. Recently treated for C. difficile colitis but PCR was negative.   She was readmitted with frequent stools, abdominal pain, DKA. Also seen by GI and psychiatry (recommended outpatient counseling). Patient requesting repeatedly for IV Phenergan and IV Dilaudid per chart. She has continued to have refractory diarrhea and chronic abdominal pain without acute features. She has been followed by GI and workup has revealed pancreatic insufficiency (abnormal fecal elastase and increased fecal fat). GI has increased the dose of pancreatic enzymes and she may be able to go home 6/8 if her condition improves.  Assessment/Plan: 1. Refractory diarrhea, reportedly high volume secondary to pancreatic insufficiency. C. difficile negative. Previous evaluation including endoscopy unremarkable, celiac sprue ruled out, microscopic colitis ruled out. Continue Lomotil, Imodium, cholestyramine per GI. 2. Pancreatic insufficiency. Management per GI. 3. DKA, diabetes mellitus type 1. Resolved. Continue Lantus, sliding scale insulin. Hemoglobin A1c 11.8 less than 3 months ago. Blood sugars somewhat labile. Hold sliding scale insulin unless she eats. 4. Diabetic gastroparesis. No vomiting noted. 5. Normocytic anemia. Stable. Iron studies, vitamin B12, TSH recently within normal limits. 6. Chronic abdominal pain. Continue concentrated oxycodone oral solution. Avoid IV narcotics. Continue famotidine, liquid Tylenol and ibuprofen. Dr. Rise Mu recommendations noted: Patient tolerating by mouth, therefore no IV narcotics.  Recommend pain clinic referral as an outpatient. 7. Severe malnutrition per dietitian.  8. Adjustment disorder with mixed anxiety and depressed mood. Psychiatry recommended outpatient referral to a therapist for counseling. Cleared for discharge from a psychiatry standpoint.   Management per GI for diarrhea including pancreatic enzymes, Lomotil, Imodium, cholestyramine. Small bowel follow-through planned.  Hold sliding scale insulin unless she eats. Monitor blood sugars.  Continue oral pain medication.  Maybe a little tomorrow.  Discussed with mother at bedside  Code Status: full code DVT prophylaxis: Lovenox Family Communication:  Disposition Plan: home  Murray Hodgkins, MD  [facility]  Pager [phone] If 7PM-7AM, please contact night-coverage at [email or web address], password [facility] 07/30/2014, 10:59 AM  LOS: 6 days   Consultants:  Diabetic educator  Gastroenterology, Dr. Olevia Perches  Psychiatry, Dr. Corena Pilgrim  Procedures:  none  Antibiotics:  Flagyl discontinued  HPI/Subjective: Overall seems to be doing okay. Numerous stools.  Objective: Filed Vitals:   07/29/14 0621 07/29/14 1446 07/29/14 2114 07/30/14 0559  BP: 105/65 110/72 99/68 90/62   Pulse: 108 90 83 85  Temp: 98.5 F (36.9 C) 98.7 F (37.1 C) 99.1 F (37.3 C) 98.6 F (37 C)  TempSrc: Oral Oral Oral Oral  Resp: 18 18 20 17   Height:      Weight:      SpO2: 100% 100% 100% 100%    Intake/Output Summary (Last 24 hours) at 07/30/14 1059 Last data filed at 07/29/14 1700  Gross per 24 hour  Intake    720 ml  Output   1600 ml  Net   -880 ml     Filed Weights   07/25/14 1401  Weight: 44.1 kg (97 lb 3.6 oz)    Exam:    Remains afebrile, vital signs are stable. No hypoxia. General:  Appears calm and comfortable Cardiovascular: RRR, no m/r/g. No LE edema. Respiratory: CTA bilaterally, no w/r/r. Normal  respiratory effort. Abdomen: soft, nondistended, mild generalized  tenderness Psychiatric: grossly normal mood and affect, speech fluent and appropriate  New data reviewed:  Stool output 2400  CBG labile with one episode of hypoglycemia.  Basic metabolic panel unremarkable.  Pertinent data since admission:  Urine pregnancy negative  C. difficile PCR negative  Fecal lactoferrin negative  Stool culture unremarkable  Stool for qualitative fat, increased  Pending data:    Scheduled Meds: . acetaminophen (TYLENOL) oral liquid 160 mg/5 mL  650 mg Oral TID  . amitriptyline  100 mg Oral QHS  . cholestyramine light  4 g Oral BID  . dicyclomine  10 mg Oral TID AC & HS  . diphenoxylate-atropine  2 tablet Oral QID  . enoxaparin (LOVENOX) injection  30 mg Subcutaneous Daily  . famotidine  20 mg Oral BID  . gabapentin  300 mg Oral TID  . Gerhardt's butt cream   Topical BID  . ibuprofen  400 mg Oral TID  . insulin aspart  0-5 Units Subcutaneous QHS  . insulin aspart  0-9 Units Subcutaneous TID WC  . [START ON 07/31/2014] insulin glargine  15 Units Subcutaneous q morning - 10a  . lipase/protease/amylase  72,000 Units Oral TID AC  . loperamide  2 mg Oral TID WC & HS  . multivitamins with iron  1 tablet Oral Daily  . saccharomyces boulardii  250 mg Oral BID  . traZODone  50 mg Oral QHS   Continuous Infusions:   Principal Problem:   Exocrine pancreatic insufficiency Active Problems:   DKA, type 1   DKA (diabetic ketoacidoses)   Diarrhea   Abdominal pain   Diabetic gastroparesis   Adjustment disorder with mixed anxiety and depressed mood   Type 1 diabetes mellitus with complications   Fecal incontinence   Time spent 25 minutes

## 2014-07-30 NOTE — Progress Notes (Signed)
    Progress Note   Subjective  feels okay. Diarrhea about the same. Pleased to know that we may have found an answer   Objective   Vital signs in last 24 hours: Temp:  [98.6 F (37 C)-99.1 F (37.3 C)] 98.6 F (37 C) (06/07 0559) Pulse Rate:  [83-90] 85 (06/07 0559) Resp:  [17-20] 17 (06/07 0559) BP: (90-110)/(62-72) 90/62 mmHg (06/07 0559) SpO2:  [100 %] 100 % (06/07 0559) Last BM Date: 07/30/14 General:    white female in NAD Heart:  Regular rate and rhythm Lungs: Respirations even and unlabored, lungs CTA bilaterally Abdomen:  Soft, nontender and nondistended. Normal bowel sounds. Extremities:  Without edema. Neurologic:  Alert and oriented,  grossly normal neurologically. Psych:  Cooperative. Normal mood and affect.  Intake/Output from previous day: 06/06 0701 - 06/07 0700 In: 960 [P.O.:960] Out: 2400 [Stool:2400] Intake/Output this shift:    Lab Results:  Recent Labs  07/28/14 0538  WBC 6.6  HGB 10.6*  HCT 32.1*  PLT 156   BMET  Recent Labs  07/28/14 0538 07/29/14 0505 07/30/14 0516  NA 138 137 141  K 4.3 4.0 4.1  CL 107 107 107  CO2 24 22 25   GLUCOSE 281* 337* 294*  BUN 10 15 14   CREATININE 0.51 0.58 0.66  CALCIUM 8.7* 8.6* 9.1      Assessment / Plan:   34 year old female with longstanding diabetes and chronic diarrhea. Workup has revealed pancreatic insufficiency (abnormal fecal elastase and increased fecal fat). Will the short term will increase pancreatic beyond weight based dose. Hopefully we can soon decrease some of the anti-diarrheal agents she is taking. Will make sure Questran being given at least two hours apart from any other meds.     LOS: 6 days   [name]  07/30/2014, 9:35 AM

## 2014-07-31 DIAGNOSIS — K868 Other specified diseases of pancreas: Secondary | ICD-10-CM

## 2014-07-31 LAB — BASIC METABOLIC PANEL
Anion gap: 11 (ref 5–15)
BUN: 13 mg/dL (ref 6–20)
CO2: 19 mmol/L — ABNORMAL LOW (ref 22–32)
Calcium: 9 mg/dL (ref 8.9–10.3)
Chloride: 105 mmol/L (ref 101–111)
Creatinine, Ser: 0.58 mg/dL (ref 0.44–1.00)
GFR calc non Af Amer: >60 mL/min (ref >60)
GLUCOSE: 406 mg/dL — AB (ref 65–99)
Potassium: 4.8 mmol/L (ref 3.5–5.1)
SODIUM: 135 mmol/L (ref 135–145)

## 2014-07-31 LAB — GLUCOSE, CAPILLARY
GLUCOSE-CAPILLARY: 164 mg/dL — AB (ref 65–99)
GLUCOSE-CAPILLARY: 181 mg/dL — AB (ref 65–99)
Glucose-Capillary: 287 mg/dL — ABNORMAL HIGH (ref 65–99)
Glucose-Capillary: 426 mg/dL — ABNORMAL HIGH (ref 65–99)
Glucose-Capillary: 236 mg/dL — ABNORMAL HIGH (ref 65–99)
Glucose-Capillary: 254 mg/dL — ABNORMAL HIGH (ref 65–99)

## 2014-07-31 MED ORDER — AMITRIPTYLINE HCL 100 MG PO TABS
100.0000 mg | ORAL_TABLET | ORAL | Status: DC
  Administered 2014-07-31: 100 mg via ORAL
  Filled 2014-07-31 (×2): qty 1

## 2014-07-31 MED ORDER — FAMOTIDINE 20 MG PO TABS
20.0000 mg | ORAL_TABLET | Freq: Two times a day (BID) | ORAL | Status: DC
  Administered 2014-07-31 – 2014-08-01 (×3): 20 mg via ORAL
  Filled 2014-07-31 (×5): qty 1

## 2014-07-31 MED ORDER — ALPRAZOLAM 0.25 MG PO TABS
0.2500 mg | ORAL_TABLET | Freq: Two times a day (BID) | ORAL | Status: DC
  Administered 2014-07-31 – 2014-08-01 (×2): 0.25 mg via ORAL
  Filled 2014-07-31: qty 1

## 2014-07-31 MED ORDER — GLUCERNA SHAKE PO LIQD
237.0000 mL | Freq: Two times a day (BID) | ORAL | Status: DC
  Administered 2014-07-31 – 2014-08-01 (×2): 237 mL via ORAL
  Filled 2014-07-31 (×3): qty 237

## 2014-07-31 MED ORDER — TRAZODONE HCL 50 MG PO TABS
50.0000 mg | ORAL_TABLET | ORAL | Status: DC
  Administered 2014-07-31: 50 mg via ORAL
  Filled 2014-07-31 (×2): qty 1

## 2014-07-31 MED ORDER — SODIUM CHLORIDE 0.9 % IV BOLUS (SEPSIS)
500.0000 mL | Freq: Once | INTRAVENOUS | Status: AC
  Administered 2014-07-31: 500 mL via INTRAVENOUS

## 2014-07-31 MED ORDER — ACETAMINOPHEN 160 MG/5ML PO SOLN
650.0000 mg | ORAL | Status: DC
  Administered 2014-07-31 – 2014-08-01 (×4): 650 mg via ORAL
  Filled 2014-07-31 (×5): qty 20.3

## 2014-07-31 MED ORDER — TAB-A-VITE/IRON PO TABS
1.0000 | ORAL_TABLET | Freq: Every day | ORAL | Status: DC
  Administered 2014-07-31: 1 via ORAL
  Filled 2014-07-31 (×2): qty 1

## 2014-07-31 MED ORDER — TRAMADOL HCL 50 MG PO TABS
50.0000 mg | ORAL_TABLET | Freq: Four times a day (QID) | ORAL | Status: DC | PRN
  Administered 2014-07-31 – 2014-08-01 (×3): 50 mg via ORAL
  Filled 2014-07-31 (×3): qty 1

## 2014-07-31 MED ORDER — GABAPENTIN 300 MG PO CAPS
300.0000 mg | ORAL_CAPSULE | ORAL | Status: DC
  Administered 2014-07-31 – 2014-08-01 (×4): 300 mg via ORAL
  Filled 2014-07-31 (×6): qty 1

## 2014-07-31 MED ORDER — DIPHENOXYLATE-ATROPINE 2.5-0.025 MG PO TABS
2.0000 | ORAL_TABLET | ORAL | Status: DC
  Administered 2014-07-31 – 2014-08-01 (×6): 2 via ORAL
  Filled 2014-07-31 (×6): qty 2

## 2014-07-31 MED ORDER — OXYCODONE HCL 20 MG/ML PO CONC
10.0000 mg | ORAL | Status: DC | PRN
  Administered 2014-07-31 – 2014-08-01 (×4): 10 mg via ORAL
  Filled 2014-07-31 (×5): qty 1

## 2014-07-31 MED ORDER — DICYCLOMINE HCL 10 MG PO CAPS
10.0000 mg | ORAL_CAPSULE | ORAL | Status: DC
  Administered 2014-07-31 – 2014-08-01 (×5): 10 mg via ORAL
  Filled 2014-07-31 (×8): qty 1

## 2014-07-31 MED ORDER — IBUPROFEN 100 MG/5ML PO SUSP
400.0000 mg | ORAL | Status: DC
  Administered 2014-07-31 – 2014-08-01 (×4): 400 mg via ORAL
  Filled 2014-07-31 (×6): qty 20

## 2014-07-31 MED ORDER — OXYCODONE HCL 20 MG/ML PO CONC
15.0000 mg | Freq: Once | ORAL | Status: AC
  Administered 2014-07-31: 15 mg via ORAL
  Filled 2014-07-31: qty 1

## 2014-07-31 MED ORDER — SACCHAROMYCES BOULARDII 250 MG PO CAPS
250.0000 mg | ORAL_CAPSULE | Freq: Two times a day (BID) | ORAL | Status: DC
  Administered 2014-07-31 – 2014-08-01 (×3): 250 mg via ORAL
  Filled 2014-07-31 (×5): qty 1

## 2014-07-31 NOTE — Progress Notes (Signed)
    Progress Note   Subjective  Terrible night with diarrhea and abdominal pain. Stool have slight substance but patient thinks that is related to contrast.   Objective   Vital signs in last 24 hours: Temp:  [97.8 F (36.6 C)-98.9 F (37.2 C)] 97.8 F (36.6 C) (06/08 0620) Pulse Rate:  [75-122] 93 (06/08 0620) Resp:  [18-20] 20 (06/08 0620) BP: (82-117)/(54-75) 82/54 mmHg (06/08 0637) SpO2:  [100 %] 100 % (06/08 0620) Last BM Date: 25-Aug-2014 General:    white female in NAD Abdomen:  Soft, nontender and nondistended. Normal bowel sounds. Extremities:  Without edema. Neurologic:  Alert and oriented,  grossly normal neurologically. Psych:  Cooperative. Normal mood and affect. BMET  Recent Labs  07/29/14 0505 25-Aug-2014 0516 07/31/14 0630  NA 137 141 135  K 4.0 4.1 4.8  CL 107 107 105  CO2 22 25 19*  GLUCOSE 337* 294* 406*  BUN 15 14 13   CREATININE 0.58 0.66 0.58  CALCIUM 8.6* 9.1 9.0   Studies/Results: Dg Small Bowel  Aug 25, 2014   CLINICAL DATA:  34 year old female with diarrhea secondary to malabsorption. History of diabetes. Abdominal pain and nausea.  EXAM: SMALL BOWEL SERIES  COMPARISON:  CT of the abdomen and pelvis 06/21/2014.  TECHNIQUE: Following ingestion of thin barium, serial small bowel images were obtained including spot views of the terminal ileum.  FLUOROSCOPY TIME:  If the device does not provide the exposure index:  Fluoroscopy Time (in minutes and seconds):  3 minutes and 13 seconds  Number of Acquired Images:  13  FINDINGS: Initial KUB demonstrated gas throughout the colon and distal rectum. No pathologic dilatation of small bowel. Subsequently, small bowel follow through was performed and terminal ileum was visualized at 1 hour and 20 minutes. Terminal ileum was grossly normal in appearance. Multiple spot compression views of the small bowel demonstrated no definite mass, mucosal abnormality, or signs of obstruction.  IMPRESSION: 1. Normal small bowel  follow-through.   Electronically Signed   By: Vinnie Langton M.D.   On: 08/25/14 14:15     Assessment / Plan:   34 year old female with longstanding diabetes and chronic diarrhea. Workup has revealed pancreatic insufficiency (abnormal fecal elastase and increased fecal fat). We increased Creon yesterday but she was npo part of day for SBFT then kitchen closed at dinner time. Terrible night with abdominal pain and diarrhea. Because patient didn't eat she didn't get Creon so yesterday not a good gauge. Eating breakfast now, had 72K of creon, will see how she does today. I don't understand what is causing her terrible abdominal pain. Small bowel follow through yesterday was normal.  She had residual colitis on last last CT (after C-diff). I would think colitis has resolved by now but if terrible pain continues she may need repeat CTscan.     LOS: 7 days   [name]  07/31/2014, 9:53 AM     Attending physician's note   I have taken an interval history, reviewed the chart and examined the patient. I agree with the Advanced Practitioner's note, impression and recommendations. Assess response to increased dose of pancreatic enzymes, low fat diet and lactose free diet. No additional GI evaluation planned at this time. DM1 remains difficult to optimally control and this can also lead to diarrhea.   Pricilla Riffle. Fuller Plan, MD [facility]

## 2014-07-31 NOTE — Progress Notes (Addendum)
PROGRESS NOTE  [name]:[id number] DOB: December 20, 1980 DOA: 07/24/2014 PCP: [name], MD  Summary: 34 year old woman with history of diabetes mellitus type 1, recurrent DKA, chronic abdominal pain, gastroparesis with recent hospitalization May 2016 at which time she underwent EGD and flexible sigmoidoscopy, both unremarkable. C. difficile PCR was negative at that time as well as celiac sprue workup. Recently treated for C. difficile colitis but PCR was negative.   She was readmitted with frequent stools, abdominal pain, DKA. Also seen by GI and psychiatry (recommended outpatient counseling). Patient requesting repeatedly for IV Phenergan and IV Dilaudid per chart. She has continued to have refractory diarrhea and chronic abdominal pain without acute features. She has been followed by GI and workup has revealed pancreatic insufficiency (abnormal fecal elastase and increased fecal fat). GI has increased the dose of pancreatic enzymes and she may be able to go home 6/8 if her condition improves.  Assessment/Plan: 1. Refractory diarrhea, reportedly high volume secondary to pancreatic insufficiency. C. difficile negative. Previous evaluation including endoscopy unremarkable, celiac sprue ruled out, microscopic colitis ruled out. Continue Lomotil, Imodium, cholestyramine per GI. Normal small bowel follow-through, the patient had recent admission with similar complaints, or discharge summary was reviewed, she had extensive workup for similar complaints of diarrhea, where patient clinical presentation and final clinical findings were inconsistent during that hospital stay. 2. Pancreatic insufficiency. Management per GI. Creon was significantly increased 3. DKA, diabetes mellitus type 1. Resolved. Continue Lantus, sliding scale insulin. Hemoglobin A1c 11.8 less than 3 months ago. Blood sugars somewhat labile. Continue with insulin sliding scale. 4. Diabetic gastroparesis. No vomiting  noted. 5. Normocytic anemia. Stable. Iron studies, vitamin B12, TSH recently within normal limits. 6. Chronic abdominal pain. Patient appears to be comfortable , will decrease  concentrated oxycodone oral solution. Avoid IV narcotics. Continue famotidine, liquid Tylenol and ibuprofen. Dr. Rise Mu recommendations noted: Patient tolerating by mouth, therefore no IV narcotics. Recommend pain clinic referral as an outpatient. 7. Severe malnutrition per dietitian.  8. Adjustment disorder with mixed anxiety and depressed mood. Psychiatry recommended outpatient referral to a therapist for counseling. Cleared for discharge from a psychiatry standpoint.   Code Status: full code DVT prophylaxis: Lovenox Family Communication: None at bedside Disposition Plan: home  Phillips Climes , MD  [facility]  Pager [phone] If 7PM-7AM, please contact night-coverage at [email or web address], password [facility] 07/31/2014, 1:20 PM  LOS: 7 days   Consultants:  Diabetic educator  Gastroenterology, Dr. Olevia Perches  Psychiatry, Dr. Corena Pilgrim  Procedures:  none  Antibiotics:  Flagyl discontinued  HPI/Subjective: Overall seems to be doing okay. Numerous stools.  Objective: Filed Vitals:   07/30/14 2122 07/31/14 0620 07/31/14 0637 07/31/14 0900  BP: 117/75 88/57 82/54  93/60  Pulse: 122 93  97  Temp: 98.6 F (37 C) 97.8 F (36.6 C)  98.7 F (37.1 C)  TempSrc: Oral Oral  Axillary  Resp: 20 20  20   Height:      Weight:      SpO2: 100% 100%  100%    Intake/Output Summary (Last 24 hours) at 07/31/14 1320 Last data filed at 07/31/14 0540  Gross per 24 hour  Intake    480 ml  Output      0 ml  Net    480 ml     Filed Weights   07/25/14 1401  Weight: 44.1 kg (97 lb 3.6 oz)    Exam:    Remains afebrile, vital signs are stable. No hypoxia. General:  Appears calm and comfortable Cardiovascular: RRR,  no m/r/g. No LE edema. Respiratory: CTA bilaterally, no w/r/r. Normal respiratory  effort. Abdomen: soft, nondistended, mild generalized tenderness Psychiatric: grossly normal mood and affect, speech fluent and appropriate  New data reviewed:  Stool output 2400  CBG labile with one episode of hypoglycemia.  Basic metabolic panel unremarkable.  Pertinent data since admission:  Urine pregnancy negative  C. difficile PCR negative  Fecal lactoferrin negative  Stool culture unremarkable  Stool for qualitative fat, increased  Pending data:    Scheduled Meds: . acetaminophen (TYLENOL) oral liquid 160 mg/5 mL  650 mg Oral 3 times per day  . amitriptyline  100 mg Oral Q24H  . cholestyramine light  4 g Oral BID  . dicyclomine  10 mg Oral 4 times per day  . diphenoxylate-atropine  2 tablet Oral 4 times per day  . enoxaparin (LOVENOX) injection  30 mg Subcutaneous Daily  . famotidine  20 mg Oral Q12H  . gabapentin  300 mg Oral 3 times per day  . Gerhardt's butt cream   Topical BID  . ibuprofen  400 mg Oral 3 times per day  . insulin aspart  0-5 Units Subcutaneous QHS  . insulin aspart  0-9 Units Subcutaneous TID WC  . insulin glargine  15 Units Subcutaneous q morning - 10a  . lipase/protease/amylase  72,000 Units Oral TID AC  . loperamide  2 mg Oral TID WC & HS  . multivitamins with iron  1 tablet Oral q1800  . saccharomyces boulardii  250 mg Oral Q12H  . traZODone  50 mg Oral Q24H   Continuous Infusions:   Principal Problem:   Exocrine pancreatic insufficiency Active Problems:   DKA, type 1   DKA (diabetic ketoacidoses)   Diarrhea   Abdominal pain   Diabetic gastroparesis   Adjustment disorder with mixed anxiety and depressed mood   Type 1 diabetes mellitus with complications   Fecal incontinence   Diarrhea due to malabsorption   Time spent 25 minutes

## 2014-07-31 NOTE — Progress Notes (Signed)
CBG 129 

## 2014-07-31 NOTE — Progress Notes (Signed)
Nutrition Follow-up  DOCUMENTATION CODES:  Underweight, Severe malnutrition in context of chronic illness  INTERVENTION: - Will order Glucerna Shake - RD will continue to monitor for needs  NUTRITION DIAGNOSIS:  Increased nutrient needs related to altered GI function, chronic illness, vomiting as evidenced by estimated needs. -ongoing  GOAL:  Patient will meet greater than or equal to 90% of their needs -likely met  MONITOR:  PO intake, Supplement acceptance, Weight trends, Labs, I & O's  ASSESSMENT: Per H&P, patient has frequent bouts of abdominal pain sometimes associated with DKA. Patient states that she started to have abdominal pain since today (6/1). Patient states her diarrhea is less frequent but with significant urgency. She is currently on Flagyl it's unclear what has been restarted. Patient was admitted in the beginning of May with abdominal pain and diarrhea. She undergone flexible sigmoidoscopy on May 2 showed no pseudo-membranous colitis. Patient's medical course had been complicated by poor compliance. During her past admission she was negative for C. difficile negative celiac sprue workup as well. Patient was claiming to have 2-3 L of diarrhea output per day she had a rectal tube placed to monitor this and was found to have only 700 mL of soft stool produced. After after that patient was able to be discharged home.  6/3: - She states that she is continuously up to the bathroom after meals and wears diapers at home because she "messes" during the night - Pt's mother indicates that pt will vomit what appears to be undigested food  - Mother reports that pt weighed 135 lbs in December and that she recently gained weight from 95 lbs to 97 lbs - Per weight hx review, pt has lost 25 lbs (20.5% body weight) in 6 months - Severe muscle and fat wasting - States she has been drinking Glucerna constantly and would like to receive it 6x/day  6/8: -New consult for assessment and  education - Pt ate 100% of dinner last night with no other intakes documented since last assessment - She indicates that she has had a good appetite overall and has been eating as well as she can - Pt indicates that she was told that she is not able to have Glucerna Shake because of carbohydrate content; will re-order BID. This supplement is formulated for diabetic individuals and is beneficial for pt for increased protein and hydration - Pancreatic insufficiency per GI with need for Creon increase - She states that she does not have any questions based on discussion with DM Coordinator yesterday or anything else related to diet at this time - She indicates that she has been trying different foods to determine what does and does not irritate her stomach - Likely meeting needs. Medications reviewed. CBGs: 72-426 mg/dL  Height:  Ht Readings from Last 1 Encounters:  07/25/14 5' 5" (1.651 m)    Weight:  Wt Readings from Last 1 Encounters:  07/25/14 97 lb 3.6 oz (44.1 kg)    Ideal Body Weight:  56.8 kg (kg)  Wt Readings from Last 10 Encounters:  07/25/14 97 lb 3.6 oz (44.1 kg)  07/21/14 93 lb (42.185 kg)  07/06/14 98 lb 1.7 oz (44.5 kg)  06/11/14 95 lb (43.092 kg)  05/28/14 99 lb 3.3 oz (45 kg)  05/09/14 100 lb (45.36 kg)  04/02/14 110 lb (49.896 kg)  03/30/14 101 lb 13.6 oz (46.2 kg)  03/25/14 106 lb (48.081 kg)  03/05/14 122 lb 9.2 oz (55.6 kg)    BMI:  Body mass index  is 16.18 kg/(m^2).  Estimated Nutritional Needs:  Kcal:  1900-2100  Protein:  85-100 grams  Fluid:  >3 L/day  Skin:  Reviewed, no issues  Diet Order:  Diet Carb Modified Fluid consistency:: Thin; Room service appropriate?: Yes  EDUCATION NEEDS:  No education needs identified at this time   Intake/Output Summary (Last 24 hours) at 07/31/14 1422 Last data filed at 07/31/14 1407  Gross per 24 hour  Intake    480 ml  Output    400 ml  Net     80 ml    Last BM:  6/8   Jarome Matin, RD,  LDN Inpatient Clinical Dietitian Pager # [phone] After hours/weekend pager # [phone]

## 2014-07-31 NOTE — Progress Notes (Signed)
Hypoglycemic event: CBG 57  8oz OJ, Graham crackers, peanut butter given

## 2014-07-31 NOTE — Progress Notes (Signed)
Inpatient Diabetes Program Recommendations  AACE/ADA: New Consensus Statement on Inpatient Glycemic Control (2013)  Target Ranges:  Prepandial:   less than 140 mg/dL      Peak postprandial:   less than 180 mg/dL (1-2 hours)      Critically ill patients:  140 - 180 mg/dL   Reason for Visit: Type 1 DM  Pt did not receive any basal insulin on 6/7. Orders changed to Lantus 15 units QAM to begin today. Hypoglycemia in am and pm on 6/7. FBS > 400 mg/dL this am.  Results for TYARA, DASSOW (MRN [id number]) as of 07/31/2014 10:10  Ref. Range 07/30/2014 10:08 07/30/2014 10:35 07/30/2014 16:43 07/30/2014 21:25 07/30/2014 21:54 07/31/2014 00:48 07/31/2014 07:39 07/31/2014 10:03  Glucose-Capillary Latest Ref Range: 65-99 mg/dL 48 (L) 123 (H) 212 (H) 72 57 (L) 287 (H) 426 (H) 236 (H)    Blood sugars very labile. PO intake varying. Will continue to follow. Thank you. Lorenda Peck, RD, LDN, CDE Inpatient Diabetes Coordinator [phone]

## 2014-08-01 LAB — BASIC METABOLIC PANEL
Anion gap: 8 (ref 5–15)
BUN: 10 mg/dL (ref 6–20)
CHLORIDE: 107 mmol/L (ref 101–111)
CO2: 22 mmol/L (ref 22–32)
Calcium: 8.6 mg/dL — ABNORMAL LOW (ref 8.9–10.3)
Creatinine, Ser: 0.55 mg/dL (ref 0.44–1.00)
Glucose, Bld: 375 mg/dL — ABNORMAL HIGH (ref 65–99)
POTASSIUM: 4.4 mmol/L (ref 3.5–5.1)
SODIUM: 137 mmol/L (ref 135–145)

## 2014-08-01 LAB — GLUCOSE, CAPILLARY
GLUCOSE-CAPILLARY: 406 mg/dL — AB (ref 65–99)
Glucose-Capillary: 477 mg/dL — ABNORMAL HIGH (ref 65–99)
Glucose-Capillary: 217 mg/dL — ABNORMAL HIGH (ref 65–99)

## 2014-08-01 MED ORDER — PANCRELIPASE (LIP-PROT-AMYL) 36000-114000 UNITS PO CPEP: 72000.0000 [IU] | ORAL_CAPSULE | Freq: Three times a day (TID) | ORAL | Status: DC

## 2014-08-01 MED ORDER — INSULIN ASPART 100 UNIT/ML ~~LOC~~ SOLN
6.0000 [IU] | Freq: Once | SUBCUTANEOUS | Status: AC
  Administered 2014-08-01: 6 [IU] via SUBCUTANEOUS

## 2014-08-01 MED ORDER — INSULIN ASPART 100 UNIT/ML ~~LOC~~ SOLN: 0.0000 [IU] | Freq: Three times a day (TID) | SUBCUTANEOUS | Status: DC

## 2014-08-01 MED ORDER — PANCRELIPASE (LIP-PROT-AMYL) 36000-114000 UNITS PO CPEP
72000.0000 [IU] | ORAL_CAPSULE | Freq: Three times a day (TID) | ORAL | Status: DC
  Administered 2014-08-01 (×2): 72000 [IU] via ORAL
  Filled 2014-08-01 (×4): qty 2

## 2014-08-01 MED ORDER — TRAMADOL HCL 50 MG PO TABS: 50.0000 mg | ORAL_TABLET | Freq: Four times a day (QID) | ORAL | Status: DC | PRN

## 2014-08-01 MED ORDER — SODIUM CHLORIDE 0.9 % IV BOLUS (SEPSIS)
500.0000 mL | Freq: Once | INTRAVENOUS | Status: AC
  Administered 2014-08-01: 500 mL via INTRAVENOUS

## 2014-08-01 MED ORDER — METRONIDAZOLE 500 MG PO TABS: 500.0000 mg | ORAL_TABLET | Freq: Three times a day (TID) | ORAL | Status: AC

## 2014-08-01 NOTE — Hospital Discharge Follow-Up (Signed)
Transitional Care Clinic Care Coordination Note:  Admit date:  07/24/14 Discharge date: ? 08/01/14 Discharge Disposition: Home Patient contact: [phone] Emergency contact(s): Jetta-mother [phone])  This Case Manager reviewed patient's EMR and determined patient would benefit from post-discharge medical management and chronic care management services through the [facility]. Patient has a history of type 1 diabetes mellitus, pancreatic insufficiency, malnutrition, chronic abdominal pain. This Case Manager met with patient to discuss the services and medical management that can be provided at the [facility]. Patient verbalized understanding and agreed to receive post-discharge care at the [facility].   Patient scheduled for Transitional Care appointment on 08/07/14 at 1200 with Dr. Jarold Song.  Clinic information and appointment time provided to patient. Appointment information also placed on AVS.  Assessment:       Home Environment:  Patient lives in a private residence with her two children       Support System:  Mother-Jetta        Level of functioning: independent with daily activities       Home DME: none       Home care services: none       Transportation:  Patient's mother, Geraldine Solar, indicates patient has a vehicle that she uses when she feels well enough to drive. Otherwise, patient's mother provides transportation as needed for patient.        Food/Nutrition: Patient recently lost Food Stamps; however, patient and patient's mother indicates patient plans to go to [employer] as soon as possible to see if patient able to qualify for Food Stamps again. Patient's mother indicates if patient having difficulty affording food she will purchase needed food for patient.        Medications: Patient typically gets prescriptions filled at [facility] in [place] or [facility] on [address].  Patient able to afford copays and medications unless medication  not covered by Medicaid.  Thoroughly discussed available pharmacy resources available at [facility].          PCP: Dr. Sheryle Hail ([address], [place], [place] [address], ph# [phone])  Patient Education: Pharmacy resources available at [facility] [place] services:        Services communicated to Leanne Chang, RN CM

## 2014-08-01 NOTE — Progress Notes (Signed)
Inpatient Diabetes Program Recommendations  AACE/ADA: New Consensus Statement on Inpatient Glycemic Control (2013)  Target Ranges:  Prepandial:   less than 140 mg/dL      Peak postprandial:   less than 180 mg/dL (1-2 hours)      Critically ill patients:  140 - 180 mg/dL   Reason for Visit: Hyperglycemia  Results for [name], [name] (MRN [id number]) as of 08/01/2014 12:15  Ref. Range 07/31/2014 21:27 08/01/2014 07:48 08/01/2014 11:57  Glucose-Capillary Latest Ref Range: 65-99 mg/dL 254 (H) 477 (H) 406 (H)    Hyperglycemia. Needs meal coverage insulin.  Please consider addition of Novolog 3 units tidwc if pt eats > 50% meal.  Will continue to follow. Thank you. Lorenda Peck, RD, LDN, CDE Inpatient Diabetes Coordinator [phone]

## 2014-08-01 NOTE — Progress Notes (Signed)
Cbg=406. Dr [facility] notified by text. Nakeisha Greenhouse, [employer]

## 2014-08-01 NOTE — Plan of Care (Signed)
Problem: Discharge Progression Outcomes Goal: Obtain signed CBG meter Rx form Outcome: Not Applicable Date Met:  08/01/14 Already has one at home     

## 2014-08-01 NOTE — Progress Notes (Signed)
Informed by NT of CBG=477. CN to notify Dr Waldron [facility]. Bengie Kaucher, [employer]

## 2014-08-01 NOTE — Progress Notes (Addendum)
Patient ID: [name], female   DOB: [birth year], 34 y.o.   MRN: [id number]    Progress Note   Subjective  No better- at least 15 BM's past 24 hours-liquid some larger volume- ongoing abdominal pain   Objective   Vital signs in last 24 hours: Temp:  [97.9 F (36.6 C)-98.7 F (37.1 C)] 98.3 F (36.8 C) (06/09 0553) Pulse Rate:  [86-97] 86 (06/09 0553) Resp:  [18-20] 18 (06/09 0553) BP: (93-98)/(59-65) 98/65 mmHg (06/09 0553) SpO2:  [98 %-100 %] 100 % (06/09 0553) Last BM Date: 07/31/14 General:    Thin white female in NAD Heart:  Regular rate and rhythm; no murmurs Lungs: Respirations even and unlabored, lungs CTA bilaterally Abdomen:  Soft,tender rather generally, Bs+ Extremities:  Without edema. Neurologic:  Alert and oriented,  grossly normal neurologically. Psych:  Cooperative. Normal mood and affect.  Intake/Output from previous day: 06/08 0701 - 06/09 0700 In: 1080 [P.O.:1080] Out: 2800 [Urine:500; Stool:2300] Intake/Output this shift:    Lab Results: No results for input(s): WBC, HGB, HCT, PLT in the last 72 hours. BMET  Recent Labs  2014/07/31 0516 07/31/14 0630 08/01/14 0520  NA 141 135 137  K 4.1 4.8 4.4  CL 107 105 107  CO2 25 19* 22  GLUCOSE 294* 406* 375*  BUN 14 13 10   CREATININE 0.66 0.58 0.55  CALCIUM 9.1 9.0 8.6*   LFT No results for input(s): PROT, ALBUMIN, AST, ALT, ALKPHOS, BILITOT, BILIDIR, IBILI in the last 72 hours. PT/INR No results for input(s): LABPROT, INR in the last 72 hours.  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 History of diabetes. Abdominal pain and nausea.  EXAM: SMALL BOWEL SERIES  COMPARISON:  CT of the abdomen and pelvis 06/21/2014.  TECHNIQUE: Following ingestion of thin barium, serial small bowel images were obtained including spot views of the terminal ileum.  FLUOROSCOPY TIME:  If the device does not provide the exposure index:   Fluoroscopy Time (in minutes and seconds):  3 minutes and 13 seconds  Number of Acquired Images:  13  FINDINGS: Initial KUB demonstrated gas throughout the colon and distal rectum. No pathologic dilatation of small bowel. Subsequently, small bowel follow through was performed and terminal ileum was visualized at 1 hour and 20 minutes. Terminal ileum was grossly normal in appearance. Multiple spot compression views of the small bowel demonstrated no definite mass, mucosal abnormality, or signs of obstruction.  IMPRESSION: 1. Normal small bowel follow-through.   Electronically Signed   By: Vinnie Langton M.D.   On: 07-31-2014 14:15       Assessment / Plan:   34 yo female with IDDM with severe diarrhea/abdominal pain-sxs x 6 months- had C. diff in April -treated but says sxs never resolved Has documented pancreatic insufficiency, consider visceral neuropathy, bacterial OG ? Need to reassess for C diff, continue Bentyl QID for cramping/pain Continue Lomotil  scheduled, and Creon 72,000 ac and 36,000 before snacks Will stop questran as may affect absorption of her multiple other meds Add Flagyl empirically 250 QID x 14 days- can use for Bact OG as well All of the above sxs are chronic x 6 months- and likley can be managed on an outpt basis GI can follow up - however her care needs to be directed by an Endocrinologist, and may need tertiary care referral- I see Dr Renne Crigler was considering referral to [facility] for Islet cell transplant.  If her  GI symptoms do not substantially improve may need referral to a tertiary center for GI evaluation/mgmt. Defer decision to her primary GI MD as outpatient.    Principal Problem:   Exocrine pancreatic insufficiency Active Problems:   DKA, type 1   DKA (diabetic ketoacidoses)   Diarrhea   Abdominal pain   Diabetic gastroparesis   Adjustment disorder with mixed anxiety and depressed mood   Type 1 diabetes mellitus with complications   Fecal incontinence    Diarrhea due to malabsorption    LOS: 8 days   [name]  08/01/2014, 8:56 AM      Attending physician's note   I have taken an interval history, reviewed the chart and examined the patient. I agree with the Advanced Practitioner's note, impression and recommendations. OK for discharge from GI standpoint. Maintain current schedule and dosage of Creon, Bentyl and Lomotil. Low fat and lactose free diet restrictions. Continue Flagyl 250 mg qid for 14 days for possible bacterial overgrowth. GI signing off. Outpatient GI follow up with Dr. Oretha Caprice in about 4 weeks.   Pricilla Riffle. Fuller Plan, MD [facility]

## 2014-08-01 NOTE — Discharge Summary (Signed)
[name], is a 34 y.o. female  DOB Aug 31, 1980  MRN [id number].  Admission date:  07/24/2014  Admitting Physician  Toy Baker, MD  Discharge Date:  08/01/2014   Primary MD  [name], MD  Recommendations for primary care physician for things to follow:  - Patient with brittle diabetes mellitus, to continue follow with her endocrinologist . - Patient will be seen by [facility] gastroenterology, referral has been made, discussed with Dr. Donna Christen, patient will be contacted from [facility] regarding an appointment within 2 weeks.    Admission Diagnosis  hyperglycemia   Discharge Diagnosis  hyperglycemia    Principal Problem:   Exocrine pancreatic insufficiency Active Problems:   DKA, type 1   DKA (diabetic ketoacidoses)   Diarrhea   Abdominal pain   Diabetic gastroparesis   Adjustment disorder with mixed anxiety and depressed mood   Type 1 diabetes mellitus with complications   Fecal incontinence   Diarrhea due to malabsorption      Past Medical History  Diagnosis Date  . DM type 1 causing complication dx 0932    hyperglycemia +/- DKA + coma, severe hypoglycemia, gastroparesis, peripheral neuropathy  . Thyroid disease     hypothyroidism associated with pregnancy  . Back pain   . PONV (postoperative nausea and vomiting)   . Anxiety     hx panic attacks.   Marland Kitchen GERD (gastroesophageal reflux disease)   . DM gastroparesis   . Anemia     .Autoimmune hemolytic anemia. bone marrow bx 2008, Dr Marin Olp  . Colitis, Clostridium difficile 05/2014  . Fatty liver 01/2007    with hepatomegaly on CT scan.     Past Surgical History  Procedure Laterality Date  . Laparoscopy  02/2002, 06/2010    laparoscopy with lysis pelvic adhesions for pelvic pain  . Back surgery      age 59  . Tooth extraction    . Root canal  10/10/12  . Flexible sigmoidoscopy N/A 06/24/2014    Procedure: FLEXIBLE SIGMOIDOSCOPY;   Surgeon: Milus Banister, MD;  Location: [facility] ENDOSCOPY;  Service: Endoscopy;  Laterality: N/A;  . Esophagogastroduodenoscopy (egd) with propofol N/A 06/27/2014    Procedure: ESOPHAGOGASTRODUODENOSCOPY (EGD) WITH PROPOFOL;  Surgeon: Milus Banister, MD;  Location: [facility] ENDOS[facility];  Service: Endoscopy;  Laterality: N/A;       History of present illness and  Hospital Course:     Kindly see H&P for history of present illness and admission details, please review complete Labs, Consult reports and Test reports for all details in brief  HPI  from the history and physical done on the day of admission Of note patient has frequent bouts of abdominal pain sometimes associated with DKA. Patient states that she started to have abdominal pain since today. The pain is diffuse, Similar to prior episodes. In the department she was noted to be hyperglycemic with evidence of mild DKA.  In 9 Currently at 20 she was given 2 L of normal saline and started on glucose stabilizer. Emergency department Patient was  given Dilaudid 1 mg 2 Phenergan 12.5 mg times twice.   Patient states her diarrhea is less frequent but with significant urgency. She is currently on Flagyl it's unclear what has been restarted.   Patient was admitted in the beginning of May with abdominal pain and diarrhea. She undergone flexible sigmoidoscopy on May 2 showed no pseudo-membranous colitis. Patient's medical course had been complicated by poor compliance. During her past admission she was negative for C. difficile negative celiac sprue workup as well. Patient was claiming to have 2-3 L of diarrhea output per day she had a rectal tube placed to monitor this and was found to have only 700 mL of soft stool produced. After after that patient was able to be discharged home   Hospital Course   34 year old woman with history of diabetes mellitus type 1, recurrent DKA, chronic abdominal pain, gastroparesis with recent hospitalization May 2016 at which  time she underwent EGD and flexible sigmoidoscopy, both unremarkable. C. difficile PCR was negative at that time as well as celiac sprue workup. Recently treated for C. difficile colitis but PCR was negative.   She was readmitted with frequent stools, abdominal pain, DKA. Also seen by GI and psychiatry (recommended outpatient counseling). Patient requesting repeatedly for IV Phenergan and IV Dilaudid per chart. She has continued to have refractory diarrhea and chronic abdominal pain without acute features. She has been followed by GI and workup has revealed pancreatic insufficiency (abnormal fecal elastase and increased fecal fat). GI has increased the dose of pancreatic enzymes without significant improvement of her diarrhea,   Refractory diarrhea, reportedly high volume secondary to pancreatic insufficiency. C. difficile negative. Previous evaluation including endoscopy unremarkable, celiac sprue ruled out, microscopic colitis ruled out. Continue Lomotil, Imodium, cholestyramine per GI. Normal small bowel follow-through, the patient had recent admission with similar complaints, or discharge summary was reviewed, she had extensive workup for similar complaints of diarrhea, where patient clinical presentation and final clinical findings were inconsistent during that hospital stay. GI were consulted and bacterial overgrowth versus visceral neuropathy, so they recommended 2 weeks of Flagyl on discharge. Given patient extensive workup which was revealing only for pancreatic insufficiency, discussed with [facility] gastroenterology(was seen by [facility] in the past for hepatic steatosis secondary to alcohol abuse ), they will arrange for a follow-up with her within the next 2 weeks for further evaluation .  Pancreatic insufficiency. Management per GI. Creon was significantly increased on discharge.  DKA, diabetes mellitus type 1. Resolved. Continue Lantus, sliding scale insulin. Hemoglobin A1c 11.8 less than 3 months ago.  Blood sugars somewhat labile. Continue with insulin sliding scale.  Diabetic gastroparesis. No vomiting noted.  Normocytic anemia. Stable. Iron studies, vitamin B12, TSH recently within normal limits.  Chronic abdominal pain. Patient appears to be comfortable , will decrease concentrated oxycodone oral solution. Avoid IV narcotics. Continue famotidine, liquid Tylenol and ibuprofen. Dr. Rise Mu recommendations noted: Patient tolerating by mouth, therefore no IV narcotics. Recommend pain clinic referral as an outpatient.  Severe malnutrition per dietitian.   Adjustment disorder with mixed anxiety and depressed mood. Psychiatry recommended outpatient referral to a therapist for counseling. Cleared for discharge from a psychiatry standpoint.   Discharge Condition: Stable   Follow UP  Follow-up Information    Follow up with [facility] 08/07/2014.   Why:  appointment at 12:00 pm   Contact information:   [address] [place] [address] [phone]      Follow up with [name], MD.  Schedule an appointment as soon as possible for a visit in 1 week.   Specialty:  Internal Medicine   Why:  post hospitalization follow up.   Contact information:   [address] [place] [address] [phone]         Discharge Instructions  and  Discharge Medications     Discharge Instructions    Discharge instructions    Complete by:  As directed   Follow with Primary MD [name], MD in 7 days  He will be contacted by [facility] gastroenterology clinic for an appointment within the next 2 week for further workup of your chronic abdominal pain and diarrhea.  Get CBC, CMP, 2 view Chest X ray checked  by Primary MD next visit.    Activity: As tolerated with Full fall precautions use walker/cane & assistance as needed   Disposition Home    Diet: Carbohydrate modified , with feeding assistance and aspiration  precautions.  For Heart failure patients - Check your Weight same time everyday, if you gain over 2 pounds, or you develop in leg swelling, experience more shortness of breath or chest pain, call your Primary MD immediately. Follow Cardiac Low Salt Diet and 1.5 lit/day fluid restriction.   On your next visit with your primary care physician please Get Medicines reviewed and adjusted.   Please request your Prim.MD to go over all Hospital Tests and Procedure/Radiological results at the follow up, please get all Hospital records sent to your Prim MD by signing hospital release before you go home.   If you experience worsening of your admission symptoms, develop shortness of breath, life threatening emergency, suicidal or homicidal thoughts you must seek medical attention immediately by calling 911 or calling your MD immediately  if symptoms less severe.  You Must read complete instructions/literature along with all the possible adverse reactions/side effects for all the Medicines you take and that have been prescribed to you. Take any new Medicines after you have completely understood and accpet all the possible adverse reactions/side effects.   Do not drive, operating heavy machinery, perform activities at heights, swimming or participation in water activities or provide baby sitting services if your were admitted for syncope or siezures until you have seen by Primary MD or a Neurologist and advised to do so again.  Do not drive when taking Pain medications.    Do not take more than prescribed Pain, Sleep and Anxiety Medications  Special Instructions: If you have smoked or chewed Tobacco  in the last 2 yrs please stop smoking, stop any regular Alcohol  and or any Recreational drug use.  Wear Seat belts while driving.   Please note  You were cared for by a hospitalist during your hospital stay. If you have any questions about your discharge medications or the care you received while you  were in the hospital after you are discharged, you can call the unit and asked to speak with the hospitalist on call if the hospitalist that took care of you is not available. Once you are discharged, your primary care physician will handle any further medical issues. Please note that NO REFILLS for any discharge medications will be authorized once you are discharged, as it is imperative that you return to your primary care physician (or establish a relationship with a primary care physician if you do not have one) for your aftercare needs so that they can reassess your need for medications and monitor your lab values.     Increase  activity slowly    Complete by:  As directed             Medication List    STOP taking these medications        colestipol 5 G packet  Commonly known as:  COLESTID     metoCLOPramide 10 MG tablet  Commonly known as:  REGLAN     rifaximin 550 MG Tabs tablet  Commonly known as:  XIFAXAN      TAKE these medications        acetaminophen 500 MG tablet  Commonly known as:  TYLENOL  Take 500 mg by mouth every 6 (six) hours as needed for moderate pain or headache (headache and stomach pain).     ALPRAZolam 0.25 MG tablet  Commonly known as:  XANAX  Take 0.25 mg by mouth 2 (two) times daily as needed for anxiety or sleep.     amitriptyline 50 MG tablet  Commonly known as:  ELAVIL  Take 100 mg by mouth at bedtime.     cyanocobalamin 500 MCG tablet  Take 500 mcg by mouth 2 (two) times daily.     dicyclomine 10 MG capsule  Commonly known as:  BENTYL  Take 10 mg by mouth 4 (four) times daily -  before meals and at bedtime.     diphenoxylate-atropine 2.5-0.025 MG per tablet  Commonly known as:  LOMOTIL  Take 2 tablets by mouth 2 (two) times daily.     feeding supplement (GLUCERNA SHAKE) Liqd  Take 237 mLs by mouth 3 (three) times daily between meals.     gabapentin 100 MG capsule  Commonly known as:  NEURONTIN  Take 300 mg by mouth at bedtime.      glucagon 1 MG injection  Commonly known as:  GLUCAGON EMERGENCY  Inject 1 mg into the muscle once as needed (severe hypoglycemia).     insulin aspart 100 UNIT/ML injection  Commonly known as:  novoLOG  Inject 0-5 Units into the skin 3 (three) times daily with meals.     insulin glargine 100 UNIT/ML injection  Commonly known as:  LANTUS  Inject 0.15 mLs (15 Units total) into the skin every morning.     lipase/protease/amylase 36000 UNITS Cpep capsule  Commonly known as:  CREON  Take 2 capsules (72,000 Units total) by mouth 4 (four) times daily -  before meals and at bedtime.     loperamide 2 MG capsule  Commonly known as:  IMODIUM  Take 2 capsules (4 mg total) by mouth 4 (four) times daily -  before meals and at bedtime.     megestrol 40 MG/ML suspension  Commonly known as:  MEGACE  Take 800 mg by mouth daily.     metroNIDAZOLE 500 MG tablet  Commonly known as:  FLAGYL  Take 1 tablet (500 mg total) by mouth 3 (three) times daily.     multivitamin with minerals Tabs tablet  Take 1 tablet by mouth every morning. Diabetic support vitamin     ondansetron 4 MG disintegrating tablet  Commonly known as:  ZOFRAN ODT  Take 1 tablet (4 mg total) by mouth every 8 (eight) hours as needed for nausea or vomiting.     oxyCODONE-acetaminophen 5-325 MG per tablet  Commonly known as:  PERCOCET  Take 1 tablet by mouth every 4 (four) hours as needed for severe pain.     promethazine 25 MG suppository  Commonly known as:  PHENERGAN  Place 1 suppository (25 mg total) rectally every  6 (six) hours as needed for nausea or vomiting.     saccharomyces boulardii 250 MG capsule  Commonly known as:  FLORASTOR  Take 1 capsule (250 mg total) by mouth 2 (two) times daily.     traMADol 50 MG tablet  Commonly known as:  ULTRAM  Take 1 tablet (50 mg total) by mouth every 6 (six) hours as needed for moderate pain or severe pain.          Diet and Activity recommendation: See Discharge Instructions  above   Consults obtained -  Gastroenterology   Major procedures and Radiology Reports - PLEASE review detailed and final reports for all details, in brief -      Dg Small Bowel  07/30/2014   CLINICAL DATA:  34 year old female with diarrhea secondary to malabsorption. History of diabetes. Abdominal pain and nausea.  EXAM: SMALL BOWEL SERIES  COMPARISON:  CT of the abdomen and pelvis 06/21/2014.  TECHNIQUE: Following ingestion of thin barium, serial small bowel images were obtained including spot views of the terminal ileum.  FLUOROSCOPY TIME:  If the device does not provide the exposure index:  Fluoroscopy Time (in minutes and seconds):  3 minutes and 13 seconds  Number of Acquired Images:  13  FINDINGS: Initial KUB demonstrated gas throughout the colon and distal rectum. No pathologic dilatation of small bowel. Subsequently, small bowel follow through was performed and terminal ileum was visualized at 1 hour and 20 minutes. Terminal ileum was grossly normal in appearance. Multiple spot compression views of the small bowel demonstrated no definite mass, mucosal abnormality, or signs of obstruction.  IMPRESSION: 1. Normal small bowel follow-through.   Electronically Signed   By: Vinnie Langton M.D.   On: 07/30/2014 14:15    Micro Results     Recent Results (from the past 240 hour(s))  Clostridium Difficile by PCR     Status: None   Collection Time: 07/25/14  8:21 AM  Result Value Ref Range Status   C difficile by pcr NEGATIVE NEGATIVE Final  Stool culture     Status: None   Collection Time: 07/25/14  8:21 AM  Result Value Ref Range Status   Specimen Description STOOL  Final   Special Requests Normal  Final   Culture   Final    NO SALMONELLA, SHIGELLA, CAMPYLOBACTER, YERSINIA, OR E.COLI 0157:H7 ISOLATED Performed at [facility]    Report Status 07/29/2014 FINAL  Final       Today   Subjective:   [name] today has no headache,no chest abdominal pain,no new  weakness tingling or numbness, feels much better today.  Objective:   Blood pressure 99/72, pulse 94, temperature 98.4 F (36.9 C), temperature source Oral, resp. rate 16, height 5\' 5"  (1.651 m), weight 44.1 kg (97 lb 3.6 oz), last menstrual period 01/22/2014, SpO2 100 %.   Intake/Output Summary (Last 24 hours) at 08/01/14 1613 Last data filed at 08/01/14 1200  Gross per 24 hour  Intake    240 ml  Output   1500 ml  Net  -1260 ml    Exam  Remains afebrile, vital signs are stable. No hypoxia.  General: Appears calm and comfortable  Cardiovascular: RRR, no m/r/g. No LE edema.  Respiratory: CTA bilaterally, no w/r/r. Normal respiratory effort.  Abdomen: soft, nondistended, nontender  Psychiatric: grossly normal mood and affect, speech fluent and appropriate  Data Review   CBC w Diff: Lab Results  Component Value Date   WBC 6.6 07/28/2014   WBC  9.2 08/18/2012   WBC 8.1 02/27/2007   HGB 10.6* 07/28/2014   HGB 12.7 02/27/2007   HCT 32.1* 07/28/2014   HCT 36.2 02/27/2007   PLT 156 07/28/2014   PLT 236 02/27/2007   LYMPHOPCT 26 07/24/2014   LYMPHOPCT 23.1 02/27/2007   MONOPCT 4 07/24/2014   MONOPCT 5.0 02/27/2007   EOSPCT 0 07/24/2014   EOSPCT 0.0 02/27/2007   BASOPCT 0 07/24/2014   BASOPCT 0.2 02/27/2007    CMP: Lab Results  Component Value Date   NA 137 08/01/2014   NA 121* 08/18/2012   K 4.4 08/01/2014   CL 107 08/01/2014   CO2 22 08/01/2014   BUN 10 08/01/2014   BUN 22* 08/18/2012   CREATININE 0.55 08/01/2014   CREATININE 1.0 08/18/2012   GLU 617 08/18/2012   PROT 8.0 07/24/2014   ALBUMIN 4.8 07/24/2014   BILITOT 1.1 07/24/2014   ALKPHOS 72 07/24/2014   AST 20 07/24/2014   ALT 31 07/24/2014  .   Total Time in preparing paper work, data evaluation and todays exam - 35 minutes  Dan Dissinger M.D on 08/01/2014 at [place] PM  [facility]   Office  [phone]

## 2014-08-01 NOTE — Care Management Note (Addendum)
Case Management Note  Patient Details  Name: [name] MRN: [id number] Date of Birth: Jan 19, 1981  Subjective/Objective:             34 yo female admitted with acute abdominal pain       Action/Plan: Janett Billow [facility] CM, confirmed that patient has been enrolled in the TCC program through the [facility] for increased follow up and access post discharge. This CM contacted the patient pharmacy, [facility] in [place], spoke with Inez Catalina and she confirmed that Lomotil is covered, Creon is covered, and provided a coupon for Imodium, which is OTC.  Spoke with patient and mother, Geraldine Solar, at bedside and they communicate that at times it is difficult to follow up with her PCP because he is in [place] and she does not live out there anymore. Geraldine Solar also stated that sometime medications that are prescribed are not formulary for Medicaid and they cannot afford some of the medications and they are having difficulty scheduling a follow up appointment with GI, as the appointments are weeks to months away and she ends up in the hospital before getting connected. Discussed [facility] and TCC program with the patient and mother and they are very interested as they feel that they are having difficulty navigating healthcare. Spoke with Janett Billow, [facility] CM, and she stated that she will visit with patient and mother today for possible enrollment. Will continue to follow. Expected Discharge Date:                  Expected Discharge Plan:  Home/Self Care  In-House Referral:     Discharge planning Services  CM Consult  Post Acute Care Choice:    Choice offered to:     DME Arranged:    DME Agency:     HH Arranged:    HH Agency:     Status of Service:  In process, will continue to follow  Medicare Important Message Given:    Date Medicare IM Given:    Medicare IM give by:    Date Additional Medicare IM Given:    Additional Medicare Important Message give by:     If discussed at [place] of Stay Meetings, dates  discussed:    Additional Comments:  Scot Dock, RN 08/01/2014, 10:14 AM

## 2014-08-02 ENCOUNTER — Telehealth: Payer: Self-pay

## 2014-08-02 NOTE — Telephone Encounter (Signed)
[facility] Post-discharge Follow-Up Phone Call:  Date of Discharge: 08/01/14 Principal Discharge Diagnosis(es): hyperglycemia Post-discharge Communication: Nurse called and spoke to patient 08/02/14 Call Completed: Y                     With Whom: Patient Interpreter Needed:  N               Language/Dialect: English     Please check all that apply:  Yes- Patient is knowledgeable of his/her condition(s) and/or treatment. Yes- Patient is caring for self at home.  Yes- Patient is receiving assist at home from mother. Family and/or caregiver is knowledgeable of patient's condition(s) and/or treatment. No- Patient is receiving home health services. If so, name of agency.     Medication Reconciliation:  Yes- Medication list reviewed with patient. Yes- Patient obtained all discharge medications. If not, why? Needs prescription for sleeping medication that she was given while in the hospital   Activities of Daily Living:  Yes- Independent No-  Needs assist (describe; ? home DME used) No- Total Care (describe, ? home DME used)   Community resources in place for patient:  Yes- None  No- Home Health/Home DME No- Assisted Living No- Support Group          Patient Education: Patient aware of appointment and has no transportation issues.        Questions/Concerns discussed: Patient would like to have medication to aid in sleeping. Patient has only been home one night but can not sleep well. Patient explains she wears diapers at night and changes them every 30 min due to bowel movement, therefore can't sleep.

## 2014-08-05 LAB — GLYCOHEMOGLOBIN, TOTAL: Hemoglobin-A1c: 16 % — ABNORMAL HIGH (ref 5.4–7.4)

## 2014-08-06 ENCOUNTER — Telehealth: Payer: Self-pay

## 2014-08-06 NOTE — Telephone Encounter (Signed)
Called the patient and confirmed her appointment for tomorrow, 08/07/14  with Dr Jarold Song at 12:00pm.  She said that her mother will be driving and CM instructed her to bring all of her medications to her appointment and she stated that she would bring them.

## 2014-08-07 ENCOUNTER — Ambulatory Visit: Payer: Medicaid Other | Attending: Family Medicine | Admitting: Family Medicine

## 2014-08-07 ENCOUNTER — Encounter: Payer: Self-pay | Admitting: Family Medicine

## 2014-08-07 VITALS — BP 95/65 | HR 107 | Temp 98.4°F | Resp 18 | Ht 65.0 in | Wt 95.6 lb

## 2014-08-07 DIAGNOSIS — Z681 Body mass index (BMI) 19 or less, adult: Secondary | ICD-10-CM | POA: Diagnosis not present

## 2014-08-07 DIAGNOSIS — F4323 Adjustment disorder with mixed anxiety and depressed mood: Secondary | ICD-10-CM | POA: Diagnosis not present

## 2014-08-07 DIAGNOSIS — Z87891 Personal history of nicotine dependence: Secondary | ICD-10-CM | POA: Diagnosis not present

## 2014-08-07 DIAGNOSIS — K8681 Exocrine pancreatic insufficiency: Secondary | ICD-10-CM

## 2014-08-07 DIAGNOSIS — E1143 Type 2 diabetes mellitus with diabetic autonomic (poly)neuropathy: Secondary | ICD-10-CM

## 2014-08-07 DIAGNOSIS — G2581 Restless legs syndrome: Secondary | ICD-10-CM

## 2014-08-07 DIAGNOSIS — E46 Unspecified protein-calorie malnutrition: Secondary | ICD-10-CM | POA: Diagnosis not present

## 2014-08-07 DIAGNOSIS — Z79818 Long term (current) use of other agents affecting estrogen receptors and estrogen levels: Secondary | ICD-10-CM | POA: Insufficient documentation

## 2014-08-07 DIAGNOSIS — K529 Noninfective gastroenteritis and colitis, unspecified: Secondary | ICD-10-CM | POA: Diagnosis not present

## 2014-08-07 DIAGNOSIS — E1010 Type 1 diabetes mellitus with ketoacidosis without coma: Secondary | ICD-10-CM | POA: Diagnosis not present

## 2014-08-07 DIAGNOSIS — G4700 Insomnia, unspecified: Secondary | ICD-10-CM | POA: Insufficient documentation

## 2014-08-07 DIAGNOSIS — E1043 Type 1 diabetes mellitus with diabetic autonomic (poly)neuropathy: Secondary | ICD-10-CM | POA: Insufficient documentation

## 2014-08-07 DIAGNOSIS — E43 Unspecified severe protein-calorie malnutrition: Secondary | ICD-10-CM

## 2014-08-07 DIAGNOSIS — Z794 Long term (current) use of insulin: Secondary | ICD-10-CM | POA: Insufficient documentation

## 2014-08-07 DIAGNOSIS — E1065 Type 1 diabetes mellitus with hyperglycemia: Secondary | ICD-10-CM | POA: Diagnosis not present

## 2014-08-07 DIAGNOSIS — IMO0002 Reserved for concepts with insufficient information to code with codable children: Secondary | ICD-10-CM

## 2014-08-07 DIAGNOSIS — K3184 Gastroparesis: Secondary | ICD-10-CM | POA: Diagnosis not present

## 2014-08-07 DIAGNOSIS — K868 Other specified diseases of pancreas: Secondary | ICD-10-CM | POA: Insufficient documentation

## 2014-08-07 DIAGNOSIS — R109 Unspecified abdominal pain: Secondary | ICD-10-CM | POA: Insufficient documentation

## 2014-08-07 DIAGNOSIS — K909 Intestinal malabsorption, unspecified: Secondary | ICD-10-CM

## 2014-08-07 DIAGNOSIS — R1084 Generalized abdominal pain: Secondary | ICD-10-CM

## 2014-08-07 DIAGNOSIS — R197 Diarrhea, unspecified: Secondary | ICD-10-CM

## 2014-08-07 LAB — POCT URINALYSIS DIPSTICK
BILIRUBIN UA: NEGATIVE
Blood, UA: NEGATIVE
Ketones, UA: NEGATIVE
Leukocytes, UA: NEGATIVE
Nitrite, UA: NEGATIVE
PH UA: 6.5
Protein, UA: NEGATIVE
Spec Grav, UA: 1.015
UROBILINOGEN UA: 0.2

## 2014-08-07 LAB — BASIC METABOLIC PANEL
BUN: 13 mg/dL (ref 6–23)
CALCIUM: 9.2 mg/dL (ref 8.4–10.5)
CO2: 25 mEq/L (ref 19–32)
Chloride: 95 mEq/L — ABNORMAL LOW (ref 96–112)
Creat: 0.88 mg/dL (ref 0.50–1.10)
Glucose, Bld: 415 mg/dL — ABNORMAL HIGH (ref 70–99)
POTASSIUM: 4.8 meq/L (ref 3.5–5.3)
SODIUM: 132 meq/L — AB (ref 135–145)

## 2014-08-07 LAB — GLUCOSE, POCT (MANUAL RESULT ENTRY): POC Glucose: 335 mg/dl — AB (ref 70–99)

## 2014-08-07 LAB — POCT GLYCOSYLATED HEMOGLOBIN (HGB A1C): HEMOGLOBIN A1C: 11

## 2014-08-07 MED ORDER — AMITRIPTYLINE HCL 50 MG PO TABS: 100.0000 mg | ORAL_TABLET | Freq: Every day | ORAL | Status: DC

## 2014-08-07 MED ORDER — ACETAMINOPHEN-CODEINE 300-30 MG PO TABS: 1.0000 | ORAL_TABLET | Freq: Four times a day (QID) | ORAL | Status: DC | PRN

## 2014-08-07 MED ORDER — INSULIN GLARGINE 100 UNIT/ML ~~LOC~~ SOLN: 20.0000 [IU] | Freq: Every morning | SUBCUTANEOUS | Status: DC

## 2014-08-07 MED ORDER — PROMETHAZINE HCL 25 MG RE SUPP: 25.0000 mg | Freq: Four times a day (QID) | RECTAL | Status: DC | PRN

## 2014-08-07 MED ORDER — TRAZODONE HCL 50 MG PO TABS: 50.0000 mg | ORAL_TABLET | Freq: Every evening | ORAL | Status: DC | PRN

## 2014-08-07 NOTE — Progress Notes (Signed)
Patient is here for hospital follow up for DKA. Patient states that she feels "decent." Patient states that she has been having that feeling of being out of breath and reports that when she goes into DKA she feels that same way. Patient reports that her abdomen is currently in pain all over. Patient rates pain at a 7, constant, with it being aching and stabbing. Patient repots that dilaudid helps with the pain. Patient reports that she has always had this pain, but it got worse last night.   Patient BG is 335 and her A1C is 11.Patient ate this morning around 11:30am. Patient ate [place] bacon and fat free eggs. Patient took a unit of novolog after she ate.   Patient needs refill on Lomotil, glucagon emergency injection, phenergan, and tramadol.   Patient is on supplement called DigesstVive which is an herbal dietary supplement and cannot be added to medication list.

## 2014-08-07 NOTE — Patient Instructions (Signed)
Type 1 Diabetes Mellitus Type 1 diabetes mellitus, often simply referred to as diabetes, is a long-term (chronic) disease. It occurs when the islet cells in the pancreas that make insulin (a hormone) are destroyed and can no longer make insulin. Insulin is needed to move sugars from food into the tissue cells. The tissue cells use the sugars for energy. In people with type 1 diabetes, the sugars build up in the blood instead of going into the tissue cells. As a result, high blood sugar (hyperglycemia) develops. Without insulin, the body breaks down fat cells for the needed energy. This breakdown of fat cells produces acid chemicals (ketones), which increases the acid levels in the body. The effect of either high ketone or high sugar (glucose) levels can be life-threatening.  Type 1 diabetes was also previously called juvenile diabetes. It most often occurs before the age of 30, but it can occur at any age. RISK FACTORS A person is predisposed to developing type 1 diabetes if someone in his or her family has the disease and is exposed to certain additional environmental triggers.  SYMPTOMS  Symptoms of type 1 diabetes may develop gradually over days to weeks or suddenly. The symptoms occur due to hyperglycemia. The symptoms can include:   Increased thirst (polydipsia).  Increased urination (polyuria).  Increased urination during the night (nocturia).  Weight loss. This weight loss may be rapid.  Frequent, recurring infections.  Tiredness (fatigue).  Weakness.  Vision changes, such as blurred vision.  Fruity smell to your breath.  Abdominal pain.  Nausea or vomiting. DIAGNOSIS  Type 1 diabetes is diagnosed when symptoms of diabetes are present and when blood glucose levels are increased. Your blood glucose level may be checked by one or more of the following blood tests:  A fasting blood glucose test. You will not be allowed to eat for at least 8 hours before a blood sample is  taken.  A random blood glucose test. Your blood glucose is checked at any time of the day regardless of when you ate.  A hemoglobin A1c blood glucose test. A hemoglobin A1c test provides information about blood glucose control over the previous 3 months. TREATMENT  Although type 1 diabetes cannot be prevented, it can be managed with insulin, diet, and exercise.  You will need to take insulin daily to keep blood glucose in the desired range.  You will need to match insulin dosing with exercise and healthy food choices. The treatment goal is to maintain the before-meal blood sugar (preprandial glucose) level at 70-130 mg/dL.  HOME CARE INSTRUCTIONS   Have your hemoglobin A1c level checked twice a year.  Perform daily blood glucose monitoring as directed by your health care provider.  Monitor urine ketones when you are ill and as directed by your health care provider.  Take your insulin as directed by your health care provider to maintain your blood glucose level in the desired range.  Never run out of insulin. It is needed every day.  Adjust insulin based on your intake of carbohydrates. Carbohydrates can raise blood glucose levels but need to be included in your diet. Carbohydrates provide vitamins, minerals, and fiber, which are an essential part of a healthy diet. Carbohydrates are found in fruits, vegetables, whole grains, dairy products, legumes, and foods containing added sugars.  Eat healthy foods. Alternate 3 meals with 3 snacks.  Maintain a healthy weight.  Carry a medical alert card or wear your medical alert jewelry.  Carry a 15-gram carbohydrate snack   with you at all times to treat low blood glucose (hypoglycemia). Some examples of 15-gram carbohydrate snacks include:  Glucose tablets, 3 or 4.  Glucose gel, 15-gram tube.  Raisins, 2 tablespoons (24 grams).  Jelly beans, 6.  Animal crackers, 8.  Fruit juice, regular soda, or low-fat milk, 4 ounces (120  mL).  Gummy treats, 9.  Recognize hypoglycemia. Hypoglycemia occurs with blood glucose levels of 70 mg/dL and below. The risk for hypoglycemia increases when fasting or skipping meals, during or after intense exercise, and during sleep. Hypoglycemia symptoms can include:  Tremors or shakes.  Decreased ability to concentrate.  Sweating.  Increased heart rate.  Headache.  Dry mouth.  Hunger.  Irritability.  Anxiety.  Restless sleep.  Altered speech or coordination.  Confusion.  Treat hypoglycemia promptly. If you are alert and able to safely swallow, follow the 15:15 rule:  Take 15-20 grams of rapid-acting glucose or carbohydrate. Rapid-acting options include glucose gel, glucose tablets, or 4 ounces (120 mL) of fruit juice, regular soda, or low-fat milk.  Check your blood glucose level 15 minutes after taking the glucose.  Take 15-20 grams more of glucose if the repeat blood glucose level is still 70 mg/dL or below.  Eat a meal or snack within 1 hour once blood glucose levels return to normal.  Be alert to polyuria and polydipsia, which are early signs of hyperglycemia. An early awareness of hyperglycemia allows for prompt treatment. Treat hyperglycemia as directed by your health care provider.  Exercise regularly as directed by your health care provider. This includes:  Performing resistance training twice a week such as push-ups, sit-ups, lifting weights, or using resistance bands.  Performing 150 minutes of cardio exercises each week such as walking, running, or playing sports.  Staying active and spending no more than 90 minutes at one time being inactive.  Adjust your insulin dosing and food intake as needed if you start a new exercise or sport.  Follow your sick-day plan at any time you are unable to eat or drink as usual.   Do not use any tobacco products including cigarettes, chewing tobacco, or electronic cigarettes. If you need help quitting, ask your  health care provider.  Limit alcohol intake to no more than 1 drink per day for nonpregnant women and 2 drinks per day for men. You should drink alcohol only when you are also eating food. Talk with your health care provider about whether alcohol is safe for you. Tell your health care provider if you drink alcohol several times a week.  Keep all follow-up visits as directed by your health care provider.  Schedule an eye exam within 5 years of diagnosis and then annually.  Perform daily skin and foot care. Examine your skin and feet daily for cuts, bruises, redness, nail problems, bleeding, blisters, or sores. A foot exam by a health care provider should be done annually.  Brush your teeth and gums at least twice a day and floss at least once a day. Follow up with your dentist regularly.  Share your diabetes management plan with your workplace or school.  Stay up-to-date with immunizations. It is recommended that people with diabetes who are over 42 years old get the pneumonia vaccine. In some cases, two separate shots may be given. Ask your health care provider if your pneumonia vaccination is up-to-date.  Learn to manage stress.  Obtain ongoing diabetes education and support as needed.  Participate in or seek rehabilitation as needed to maintain or improve independence  and quality of life. Request a physical or occupational therapy referral if you are having foot or hand numbness, or difficulties with grooming, dressing, eating, or physical activity. SEEK MEDICAL CARE IF:   You are unable to eat food or drink fluids for more than 6 hours.  You have nausea and vomiting for more than 6 hours.  Your blood glucose level is over 240 mg/dL.  There is a change in mental status.  You develop an additional serious illness.  You have diarrhea for more than 6 hours.  You have been sick or have had a fever for a couple of days and are not getting better.  You have pain during any physical  activity. SEEK IMMEDIATE MEDICAL CARE IF:  You have difficulty breathing.  You have moderate to large ketone levels. MAKE SURE YOU:  Understand these instructions.  Will watch your condition.  Will get help right away if you are not doing well or get worse. Document Released: 02/06/2000 Document Revised: 06/25/2013 Document Reviewed: 09/07/2011 Exit[employer] Patient Information 2015 [employer]. This information is not intended to replace advice given to you by your health care provider. Make sure you discuss any questions you have with your health care provider.  

## 2014-08-07 NOTE — Progress Notes (Signed)
Subjective:    Patient ID: [name], female    DOB: 08-Apr-1980, 34 y.o.   MRN: [id number]   [place] Date: 07/24/14 Discharge Date:08/01/14  Telephone Encounter: 08/02/14  First Office visit:  PCP: Dr [name]    HPI  [name] is a 34 year old female with a history of type 1 diabetes mellitus, gastroparesis secondary to diabetes, chronic diarrhea for the past 6 months, pancreatic insufficiency who presented to the ED at [facility] with abdominal pain and was found to be hyperglycemic with evidence of mild DKA.  She was admitted and placed on IV fluids, Dilaudid, antiemetics and insulin.Patient was admitted in the beginning of May with abdominal pain and diarrhea. She underwent flexible sigmoidoscopy in 06/2014 showed no pseudo-membranous colitis and EGD revealed no anatomical outlet obstruction. Patient's medical course had been complicated by poor compliance. During her past admission she was negative for C. difficile negative celiac sprue workup as well. Patient was claiming to have 2-3 L of diarrhea output per day she had a rectal tube placed to monitor this and was found to have only 700 mL of soft stool produced.  She was seen by GI and workup revealed pancreatic insufficiency with abnormal fecal elastase and increased fecal fat. She was also seen by psychiatry and adjustment disorder with mixed anxiety and depressed mood was diagnosed with recommendations for outpatient therapy sessions for counseling. For Diabetes she was placed on Lantus and NovoLog sliding scale.  the dose of her pancreatic enzymes was increased with no significant improvement of her diarrhea and she was placed on 2 weeks of Flagyl for visceral neuropathy vs bacteria overgrowth and was scheduled to see GI at [facility].  Interval History: She tells me she is on a NovoLog sliding scale and gives herself 1 unit of NovoLog if her sugar is 300 and 2 units of NovoLog if the sugar is 400  because her sugar usually crashes once he takes higher doses of insulin; I have no idea where she got the scale from.  Past Medical History  Diagnosis Date  . DM type 1 causing complication dx 4008    hyperglycemia +/- DKA + coma, severe hypoglycemia, gastroparesis, peripheral neuropathy  . Thyroid disease     hypothyroidism associated with pregnancy  . Back pain   . PONV (postoperative nausea and vomiting)   . Anxiety     hx panic attacks.   Marland Kitchen GERD (gastroesophageal reflux disease)   . DM gastroparesis   . Anemia     .Autoimmune hemolytic anemia. bone marrow bx 2008, Dr Marin Olp  . Colitis, Clostridium difficile 05/2014  . Fatty liver 01/2007    with hepatomegaly on CT scan.     Past Surgical History  Procedure Laterality Date  . Laparoscopy  02/2002, 06/2010    laparoscopy with lysis pelvic adhesions for pelvic pain  . Back surgery      age 43  . Tooth extraction    . Root canal  10/10/12  . Flexible sigmoidoscopy N/A 06/24/2014    Procedure: FLEXIBLE SIGMOIDOSCOPY;  Surgeon: Milus Banister, MD;  Location: [facility];  Service: Endoscopy;  Laterality: N/A;  . Esophagogastroduodenoscopy (egd) with propofol N/A 06/27/2014    Procedure: ESOPHAGOGASTRODUODENOSCOPY (EGD) WITH PROPOFOL;  Surgeon: Milus Banister, MD;  Location: [facility];  Service: Endoscopy;  Laterality: N/A;    Family History  Problem Relation Age of Onset  . Diabetes Maternal Grandmother   . Cancer Maternal Grandmother  Leukemia  . Cancer Maternal Grandfather     Small cell lung cancer    History   Social History  . Marital Status: Single    Spouse Name: N/A  . Number of Children: N/A  . Years of Education: N/A   Occupational History  . Not on file.   Social History Main Topics  . Smoking status: Former Smoker    Quit date: 04/19/2000  . Smokeless tobacco: Former Systems developer  . Alcohol Use: No     Comment: RARE SIPS OF WINE  . Drug Use: No  . Sexual Activity: No   Other Topics Concern  . Not  on file   Social History Narrative   Single.  Lives with children.    Allergies  Allergen Reactions  . Scallops [Shellfish Allergy] Anaphylaxis  . Morphine And Related Itching  . Latex Rash  . Other Rash    Sunscreen and grass    Current Outpatient Prescriptions on File Prior to Visit  Medication Sig Dispense Refill  . acetaminophen (TYLENOL) 500 MG tablet Take 500 mg by mouth every 6 (six) hours as needed for moderate pain or headache (headache and stomach pain).     . cyanocobalamin 500 MCG tablet Take 500 mcg by mouth 2 (two) times daily.    Marland Kitchen dicyclomine (BENTYL) 10 MG capsule Take 10 mg by mouth 4 (four) times daily -  before meals and at bedtime.    . diphenoxylate-atropine (LOMOTIL) 2.5-0.025 MG per tablet Take 2 tablets by mouth 2 (two) times daily. 30 tablet 1  . feeding supplement, GLUCERNA SHAKE, (GLUCERNA SHAKE) LIQD Take 237 mLs by mouth 3 (three) times daily between meals. 90 Can 0  . gabapentin (NEURONTIN) 100 MG capsule Take 300 mg by mouth at bedtime.     Marland Kitchen glucagon (GLUCAGON EMERGENCY) 1 MG injection Inject 1 mg into the muscle once as needed (severe hypoglycemia). 2 each prn  . insulin aspart (NOVOLOG) 100 UNIT/ML injection Inject 0-5 Units into the skin 3 (three) times daily with meals. 10 mL 11  . lipase/protease/amylase (CREON) 36000 UNITS CPEP capsule Take 2 capsules (72,000 Units total) by mouth 4 (four) times daily -  before meals and at bedtime. 160 capsule 1  . loperamide (IMODIUM) 2 MG capsule Take 2 capsules (4 mg total) by mouth 4 (four) times daily -  before meals and at bedtime. 30 capsule 0  . megestrol (MEGACE) 40 MG/ML suspension Take 800 mg by mouth daily.    . metroNIDAZOLE (FLAGYL) 500 MG tablet Take 1 tablet (500 mg total) by mouth 3 (three) times daily. 42 tablet 0  . Multiple Vitamin (MULTIVITAMIN WITH MINERALS) TABS Take 1 tablet by mouth every morning. Diabetic support vitamin    . oxyCODONE-acetaminophen (PERCOCET) 5-325 MG per tablet Take 1  tablet by mouth every 4 (four) hours as needed for severe pain. 20 tablet 0  . saccharomyces boulardii (FLORASTOR) 250 MG capsule Take 1 capsule (250 mg total) by mouth 2 (two) times daily. 60 capsule 2  . [DISCONTINUED] cholestyramine light (PREVALITE) 4 G packet Take 1 packet (4 g total) by mouth daily as needed (diarrhea). (Patient not taking: Reported on 06/15/2014) 10 packet 0  . [DISCONTINUED] insulin lispro (HUMALOG) 100 UNIT/ML injection continuous.     No current facility-administered medications on file prior to visit.     Review of Systems  General: negative for fever, positive for weight loss, appetite change Eyes: no visual symptoms. ENT: no ear symptoms, no sinus tenderness, no nasal  congestion or sore throat. Neck: no pain  Respiratory: no wheezing, shortness of breath, cough Cardiovascular: no chest pain, no dyspnea on exertion, no pedal edema, no orthopnea. Gastrointestinal: positive for abdominal pain, positive for diarrhea, no constipation Genito-Urinary: no urinary frequency, no dysuria, no polyuria. Hematologic: no bruising Endocrine: no cold or heat intolerance Neurological: no headaches, no seizures, no tremors Musculoskeletal: no joint pains, no joint swelling Skin: no pruritus, no rash. Psychological: no depression, no anxiety,        Objective: Filed Vitals:   08/07/14 1152  BP: 95/65  Pulse: 107  Temp: 98.4 F (36.9 C)  TempSrc: Oral  Resp: 18  Height: 5\' 5"  (1.651 m)  Weight: 95 lb 9.6 oz (43.364 kg)  SpO2: 100%      Physical Exam  Constitutional: thin Eyes: PERRLA HEENT: Head is atraumatic, normal sinuses, normal oropharynx, normal appearing tonsils and palate, tympanic membrane is normal bilaterally. Neck: normal range of motion, no thyromegaly, no JVD Cardiovascular: tachycardic  Rate, normal rhythm, normal heart sounds, no murmurs, rub or gallop, no pedal edema Respiratory: clear to auscultation bilaterally, no wheezes, no rales, no  rhonchi Abdomen: soft, diffusely tender to palpation, normal bowel sounds, no enlarged organs Extremities: Full ROM, no tenderness in joints Skin: warm and dry, no lesions. Neurological: alert, oriented x3, cranial nerves I-XII grossly intact , normal motor strength, normal sensation. Psychological: normal mood.            Assessment & Plan:  34 year old female with a history of type 1 diabetes mellitus, gastroparesis secondary to diabetes, chronic dibrrhea for the past 6 months, pancreatic insufficiency and chronic abdominal pain and diarrhea of unknown etiology work up so far unrevealing except for finding of pancreatic insufficiency.  Type 1 Dm with gastroparesis: Uncontrolled with A1c of 11,8 and CBG is 335 I am holding off on giving her insulin in the clinic due to her history of crashing blood sugars Increase Lantus from 15 to 20; advised to comply with ADA diet. UA is negative for ketones, BMP sent off  Abdominal pain Unknown etiology Placed on Tylenol #3; discontinued Ativan as per clinic policy given she is on multiple controlled substances. Scheduled to see GI at [facility] in 2 weeks  Pancreatic insufficiency Continue Creon.  Diarrhea: Chronic and has been ongoing for 6 months. Likely secondary to malabsorption. Currently on Imodium  Insomnia: Placed on trazodone  Restless legs: Continue amitriptyline which patient says works well.  Protein calorie malnutrition: This is secondary to chronic diarrhea.

## 2014-08-08 ENCOUNTER — Telehealth: Payer: Self-pay

## 2014-08-08 NOTE — Telephone Encounter (Signed)
Nurse called patient, reached voicemail. Left message for patient to call Vici Novick at [phone].   

## 2014-08-08 NOTE — Progress Notes (Signed)
Quick Note:  Labs reveal glucose in urine, no ketones, low sodium which is due to elevated glucose; advised to comply with increased dose of insulin. Will follow up at next office visit. ______

## 2014-08-08 NOTE — Telephone Encounter (Signed)
-----   Message from Arnoldo Morale, MD sent at 08/08/2014  1:28 AM EDT ----- Labs reveal glucose in urine, no ketones, low sodium which is due to elevated glucose; advised to comply with increased dose of insulin. Will follow up at next office visit.

## 2014-08-09 ENCOUNTER — Telehealth: Payer: Self-pay

## 2014-08-09 NOTE — Telephone Encounter (Signed)
-----   Message from Arnoldo Morale, MD sent at 08/08/2014  1:28 AM EDT ----- Labs reveal glucose in urine, no ketones, low sodium which is due to elevated glucose; advised to comply with increased dose of insulin. Will follow up at next office visit.

## 2014-08-09 NOTE — Telephone Encounter (Signed)
Placed call to patient to remind her of appointment on 08/14/14 at 1115 with [facility] and to determine how she is doing. Patient indicates she plans to be at appointment and has needed transportation.  Gave patient lab results and informed to comply with increased Lantus dosage. She is appreciative of information and indicates she is bumping up her Lantus amount by 1 unit each day. Again, reiterated Dr. Jarold Song wants Lantus to be increased from 15 U to 20 U. Also informed patient to keep a blood glucose log to so Dr. Jarold Song can evaluate her blood glucose highs and lows. Patient verbalized understanding and appreciative of call.

## 2014-08-09 NOTE — Telephone Encounter (Signed)
[name] called patient for case management and also gave patients results.

## 2014-08-14 ENCOUNTER — Ambulatory Visit: Payer: Medicaid Other | Attending: Family Medicine | Admitting: Family Medicine

## 2014-08-14 ENCOUNTER — Encounter: Payer: Self-pay | Admitting: Family Medicine

## 2014-08-14 VITALS — BP 94/64 | HR 92 | Temp 98.6°F | Ht 65.0 in | Wt 94.0 lb

## 2014-08-14 DIAGNOSIS — K8681 Exocrine pancreatic insufficiency: Secondary | ICD-10-CM

## 2014-08-14 DIAGNOSIS — Z681 Body mass index (BMI) 19 or less, adult: Secondary | ICD-10-CM | POA: Diagnosis not present

## 2014-08-14 DIAGNOSIS — E1065 Type 1 diabetes mellitus with hyperglycemia: Secondary | ICD-10-CM | POA: Diagnosis not present

## 2014-08-14 DIAGNOSIS — E43 Unspecified severe protein-calorie malnutrition: Secondary | ICD-10-CM | POA: Diagnosis not present

## 2014-08-14 DIAGNOSIS — R109 Unspecified abdominal pain: Secondary | ICD-10-CM | POA: Diagnosis not present

## 2014-08-14 DIAGNOSIS — Z114 Encounter for screening for human immunodeficiency virus [HIV]: Secondary | ICD-10-CM | POA: Diagnosis not present

## 2014-08-14 DIAGNOSIS — K529 Noninfective gastroenteritis and colitis, unspecified: Secondary | ICD-10-CM | POA: Insufficient documentation

## 2014-08-14 DIAGNOSIS — E1043 Type 1 diabetes mellitus with diabetic autonomic (poly)neuropathy: Secondary | ICD-10-CM | POA: Diagnosis present

## 2014-08-14 DIAGNOSIS — K3184 Gastroparesis: Secondary | ICD-10-CM | POA: Insufficient documentation

## 2014-08-14 DIAGNOSIS — R197 Diarrhea, unspecified: Secondary | ICD-10-CM | POA: Diagnosis not present

## 2014-08-14 DIAGNOSIS — R1084 Generalized abdominal pain: Secondary | ICD-10-CM

## 2014-08-14 DIAGNOSIS — G2581 Restless legs syndrome: Secondary | ICD-10-CM | POA: Insufficient documentation

## 2014-08-14 DIAGNOSIS — G4700 Insomnia, unspecified: Secondary | ICD-10-CM | POA: Diagnosis not present

## 2014-08-14 DIAGNOSIS — Z794 Long term (current) use of insulin: Secondary | ICD-10-CM | POA: Diagnosis not present

## 2014-08-14 DIAGNOSIS — F419 Anxiety disorder, unspecified: Secondary | ICD-10-CM | POA: Diagnosis not present

## 2014-08-14 DIAGNOSIS — K868 Other specified diseases of pancreas: Secondary | ICD-10-CM | POA: Diagnosis not present

## 2014-08-14 DIAGNOSIS — E46 Unspecified protein-calorie malnutrition: Secondary | ICD-10-CM | POA: Diagnosis not present

## 2014-08-14 LAB — GLUCOSE, POCT (MANUAL RESULT ENTRY): POC GLUCOSE: 327 mg/dL — AB (ref 70–99)

## 2014-08-14 MED ORDER — GLUCAGON (RDNA) 1 MG IJ KIT: 1.0000 mg | PACK | Freq: Once | INTRAMUSCULAR | Status: DC | PRN

## 2014-08-14 NOTE — Progress Notes (Signed)
Quick Note:  Labs addressed at office as well as medications and patient was made aware. ______ 

## 2014-08-14 NOTE — Progress Notes (Signed)
Subjective:    Patient ID: [name], female    DOB: 24-Jul-1980, 34 y.o.   MRN: [id number]   [place] Date: 07/24/14 Discharge Date:08/01/14  Telephone Encounter: 08/02/14  First Office visit: 08/07/14  PCP: Dr [name]  HPI [name] is a 34 year old female with a history of type 1 diabetes mellitus, gastroparesis secondary to diabetes, chronic diarrhea for the past 6 months, pancreatic insufficiency recently managed for DKA at [facility].   She has had extensive GI workup for chronic diarrhea .Patient was admitted in the beginning of May with abdominal pain and diarrhea. She underwent flexible sigmoidoscopy in 06/2014 showed no pseudo-membranous colitis and EGD revealed no anatomical outlet obstruction. Patient's medical course had been complicated by poor compliance. During her past admission she was negative for C. difficile negative celiac sprue workup as well. Patient was claiming to have 2-3 L of diarrhea output per day she had a rectal tube placed to monitor this and was found to have only 700 mL of soft stool produced.  She was seen by GI and workup revealed pancreatic insufficiency with abnormal fecal elastase and increased fecal fat. She remains on Flagyl for visceral neuropathy versus bacteria overgrowth until her appointment with GI which comes up tomorrow.  At her last visit she had been hyperglycemic and so I had raised her Lantus from 15-20 units.   Interval History: She has been taking only 15 units of Lantus and not 20 because she says her sugars bottom  out resulting in her ingesting anything she lays her hand on. Her fasting blood sugar was 28 today. She is on NovoLog sliding scale but tells me she takes a maximum of 4 units to prevent hypoglycemia. Her endocrinologist is Dr Renne Crigler whom she hasn't seen in the last 2 months. She continues to have diarrhea she has had 10-15 bowel movements since midnight last with patient.  Past  Medical History  Diagnosis Date  . DM type 1 causing complication dx 0865    hyperglycemia +/- DKA + coma, severe hypoglycemia, gastroparesis, peripheral neuropathy  . Thyroid disease     hypothyroidism associated with pregnancy  . Back pain   . PONV (postoperative nausea and vomiting)   . Anxiety     hx panic attacks.   Marland Kitchen GERD (gastroesophageal reflux disease)   . DM gastroparesis   . Anemia     .Autoimmune hemolytic anemia. bone marrow bx 2008, Dr Marin Olp  . Colitis, Clostridium difficile 05/2014  . Fatty liver 01/2007    with hepatomegaly on CT scan.      Review of Systems  General: negative for fever, weight loss, appetite change Eyes: no visual symptoms. ENT: no ear symptoms, no sinus tenderness, no nasal congestion or sore throat. Neck: no pain  Respiratory: no wheezing, shortness of breath, cough Cardiovascular: no chest pain, no dyspnea on exertion, no pedal edema, no orthopnea. Gastrointestinal: see hpi Genito-Urinary: no urinary frequency, no dysuria, no polyuria. Hematologic: no bruising Endocrine: no cold or heat intolerance Neurological: no headaches, no seizures, no tremors Musculoskeletal: no joint pains, no joint swelling Skin: no pruritus, no rash. Psychological: no depression, no anxiety,      Objective: Filed Vitals:   08/14/14 1129  BP: 94/64  Pulse: 92  Temp: 98.6 F (37 C)  Height: 5\' 5"  (1.651 m)  Weight: 94 lb (42.638 kg)  SpO2: 100%      Physical Exam  Constitutional: thin.  Eyes: PERRLA HEENT:  Head is atraumatic, normal sinuses, normal oropharynx, normal appearing tonsils and palate, tympanic membrane is normal bilaterally. Neck: normal range of motion, no thyromegaly, no JVD Cardiovascular: normal rate and rhythm, normal heart sounds, no murmurs, rub or gallop, no pedal edema Respiratory: clear to auscultation bilaterally, no wheezes, no rales, no rhonchi Abdomen: tenderness to palpation Extremities: Full ROM, no tenderness in  joints Skin: warm and dry, no lesions. Neurological: alert, oriented x3, cranial nerves I-XII grossly intact , normal motor strength, normal sensation. Psychological: normal mood.         Assessment & Plan:  34 year old female with a history of type 1 diabetes mellitus, gastroparesis secondary to diabetes, chronic dibrrhea for the past 6 months, pancreatic insufficiency and chronic abdominal pain and diarrhea of unknown etiology work up so far unrevealing except for finding of pancreatic insufficiency.  Type 1 Dm with gastroparesis: Uncontrolled with A1c of 11,8 and CBG is 327 I am holding off on giving her insulin in the clinic due to her history of crashing blood sugars I would love for her to remain on Lantus 20 units but she states she feels comfortable on 15 units. Advised to schedule appointment with her endocrinologist for tighter glycemic control; I have discussed the possible end organ damages with the patient and implications of adjusting her medications on her own.  Abdominal pain Unknown etiology Scheduled to see GI tomorrow and GI at [facility] sometime in July. Currently under a pain contract with her primary care physician where she receives controlled substances for pain.  Pancreatic insufficiency Continue Creon.  Diarrhea: Chronic and has been ongoing for 6 months. Likely secondary to malabsorption. Currently on Imodium which does not help much HIV test sent off  Insomnia: Controlled on trazodone  Restless legs: Continue amitriptyline which patient says works well.  Protein calorie malnutrition: This is secondary to chronic diarrhea.  This note has been created with Surveyor, quantity. Any transcriptional errors are unintentional.

## 2014-08-14 NOTE — Progress Notes (Signed)
Patient blood sugar was 28 this morning she reports and she took 1 unit of Novolog Sugar is 327 in clinic Patient reports chronic abdominal pain and dental pain

## 2014-08-15 LAB — HIV ANTIBODY (ROUTINE TESTING W REFLEX): HIV 1&2 Ab, 4th Generation: NONREACTIVE

## 2014-08-16 ENCOUNTER — Telehealth: Payer: Self-pay | Admitting: *Deleted

## 2014-08-16 NOTE — Telephone Encounter (Signed)
Left HIPAA compliant message for patient to return my call.

## 2014-08-16 NOTE — Telephone Encounter (Signed)
-----   Message from Arnoldo Morale, MD sent at 08/16/2014  8:29 AM EDT ----- Please inform the patient that labs are normal- HIV is neg. Thank you.

## 2014-08-16 NOTE — Progress Notes (Signed)
Quick Note:  Please inform the patient that labs are normal- HIV is neg. Thank you. ______

## 2014-08-19 ENCOUNTER — Emergency Department (HOSPITAL_COMMUNITY)
Admission: EM | Admit: 2014-08-19 | Discharge: 2014-08-19 | Disposition: A | Discharge status: Home / Self Care | Payer: Medicaid Other | Attending: Emergency Medicine | Admitting: Emergency Medicine

## 2014-08-19 ENCOUNTER — Encounter (HOSPITAL_COMMUNITY): Payer: Self-pay | Admitting: *Deleted

## 2014-08-19 DIAGNOSIS — Z794 Long term (current) use of insulin: Secondary | ICD-10-CM | POA: Diagnosis not present

## 2014-08-19 DIAGNOSIS — Z87891 Personal history of nicotine dependence: Secondary | ICD-10-CM | POA: Diagnosis not present

## 2014-08-19 DIAGNOSIS — K088 Other specified disorders of teeth and supporting structures: Secondary | ICD-10-CM | POA: Insufficient documentation

## 2014-08-19 DIAGNOSIS — Z91040 Latex allergy status: Secondary | ICD-10-CM | POA: Diagnosis not present

## 2014-08-19 DIAGNOSIS — R112 Nausea with vomiting, unspecified: Secondary | ICD-10-CM | POA: Diagnosis present

## 2014-08-19 DIAGNOSIS — E860 Dehydration: Secondary | ICD-10-CM | POA: Diagnosis not present

## 2014-08-19 DIAGNOSIS — K0381 Cracked tooth: Secondary | ICD-10-CM | POA: Insufficient documentation

## 2014-08-19 DIAGNOSIS — D591 Other autoimmune hemolytic anemias: Secondary | ICD-10-CM | POA: Diagnosis not present

## 2014-08-19 DIAGNOSIS — Z79899 Other long term (current) drug therapy: Secondary | ICD-10-CM | POA: Insufficient documentation

## 2014-08-19 DIAGNOSIS — F410 Panic disorder [episodic paroxysmal anxiety] without agoraphobia: Secondary | ICD-10-CM | POA: Insufficient documentation

## 2014-08-19 DIAGNOSIS — E108 Type 1 diabetes mellitus with unspecified complications: Secondary | ICD-10-CM | POA: Diagnosis not present

## 2014-08-19 DIAGNOSIS — K0889 Other specified disorders of teeth and supporting structures: Secondary | ICD-10-CM

## 2014-08-19 LAB — CBC WITH DIFFERENTIAL/PLATELET
Basophils Absolute: 0 10*3/uL (ref 0.0–0.1)
Basophils Relative: 0 % (ref 0–1)
EOS PCT: 1 % (ref 0–5)
Eosinophils Absolute: 0.1 10*3/uL (ref 0.0–0.7)
HCT: 38.3 % (ref 36.0–46.0)
Hemoglobin: 12.7 g/dL (ref 12.0–15.0)
LYMPHS PCT: 40 % (ref 12–46)
Lymphs Abs: 2.3 10*3/uL (ref 0.7–4.0)
MCH: 29.9 pg (ref 26.0–34.0)
MCHC: 33.2 g/dL (ref 30.0–36.0)
MCV: 90.1 fL (ref 78.0–100.0)
Monocytes Absolute: 0.3 10*3/uL (ref 0.1–1.0)
Monocytes Relative: 5 % (ref 3–12)
Neutro Abs: 3.1 10*3/uL (ref 1.7–7.7)
Neutrophils Relative %: 54 % (ref 43–77)
PLATELETS: 263 10*3/uL (ref 150–400)
RBC: 4.25 MIL/uL (ref 3.87–5.11)
RDW: 12.4 % (ref 11.5–15.5)
WBC: 5.8 10*3/uL (ref 4.0–10.5)

## 2014-08-19 LAB — URINALYSIS, ROUTINE W REFLEX MICROSCOPIC
Bilirubin Urine: NEGATIVE
Glucose, UA: >1000 mg/dL — AB
HGB URINE DIPSTICK: NEGATIVE
Ketones, ur: NEGATIVE mg/dL
LEUKOCYTES UA: NEGATIVE
NITRITE: NEGATIVE
PH: 5.5 (ref 5.0–8.0)
Protein, ur: NEGATIVE mg/dL
SPECIFIC GRAVITY, URINE: 1.015 (ref 1.005–1.030)
Urobilinogen, UA: 0.2 mg/dL (ref 0.0–1.0)

## 2014-08-19 LAB — URINE MICROSCOPIC-ADD ON

## 2014-08-19 LAB — BASIC METABOLIC PANEL
Anion gap: 8 (ref 5–15)
BUN: 15 mg/dL (ref 6–20)
CALCIUM: 9.3 mg/dL (ref 8.9–10.3)
CO2: 28 mmol/L (ref 22–32)
Chloride: 99 mmol/L — ABNORMAL LOW (ref 101–111)
Creatinine, Ser: 0.77 mg/dL (ref 0.44–1.00)
GFR calc Af Amer: >60 mL/min (ref >60)
GFR calc non Af Amer: >60 mL/min (ref >60)
GLUCOSE: 439 mg/dL — AB (ref 65–99)
Potassium: 4.8 mmol/L (ref 3.5–5.1)
Sodium: 135 mmol/L (ref 135–145)

## 2014-08-19 LAB — CBG MONITORING, ED
GLUCOSE-CAPILLARY: 183 mg/dL — AB (ref 65–99)
Glucose-Capillary: 138 mg/dL — ABNORMAL HIGH (ref 65–99)

## 2014-08-19 MED ORDER — FENTANYL CITRATE (PF) 100 MCG/2ML IJ SOLN
50.0000 ug | Freq: Once | INTRAMUSCULAR | Status: AC
  Administered 2014-08-19: 50 ug via INTRAVENOUS
  Filled 2014-08-19: qty 2

## 2014-08-19 MED ORDER — SODIUM CHLORIDE 0.9 % IV BOLUS (SEPSIS)
1000.0000 mL | Freq: Once | INTRAVENOUS | Status: AC
  Administered 2014-08-19: 1000 mL via INTRAVENOUS

## 2014-08-19 MED ORDER — AMOXICILLIN 500 MG PO CAPS: 500.0000 mg | ORAL_CAPSULE | Freq: Three times a day (TID) | ORAL | Status: DC

## 2014-08-19 MED ORDER — PROMETHAZINE HCL 25 MG RE SUPP: 25.0000 mg | Freq: Four times a day (QID) | RECTAL | Status: DC | PRN

## 2014-08-19 MED ORDER — HYDROMORPHONE HCL 1 MG/ML IJ SOLN
1.0000 mg | Freq: Once | INTRAMUSCULAR | Status: AC
  Administered 2014-08-19: 1 mg via INTRAVENOUS
  Filled 2014-08-19: qty 1

## 2014-08-19 MED ORDER — INSULIN ASPART 100 UNIT/ML ~~LOC~~ SOLN
5.0000 [IU] | Freq: Once | SUBCUTANEOUS | Status: AC
  Administered 2014-08-19: 5 [IU] via INTRAVENOUS
  Filled 2014-08-19: qty 1

## 2014-08-19 MED ORDER — ONDANSETRON HCL 4 MG/2ML IJ SOLN
4.0000 mg | Freq: Once | INTRAMUSCULAR | Status: AC
  Administered 2014-08-19: 4 mg via INTRAVENOUS
  Filled 2014-08-19: qty 2

## 2014-08-19 NOTE — ED Provider Notes (Signed)
CSN: [id number]     Arrival date & time 08/19/14  1445 History   First MD Initiated Contact with Patient 08/19/14 1830     Chief Complaint  Patient presents with  . Abdominal Pain     (Consider location/radiation/quality/duration/timing/severity/associated sxs/prior Treatment) HPI..... Nausea, vomiting, diarrhea since yesterday. Patient is a type I diabetic and was recently admitted for diabetic ketoacidosis. Review systems positive for toothache and abdominal pain. She does not think she is in DKA, but is dehydrated.   Past Medical History  Diagnosis Date  . DM type 1 causing complication dx 8546    hyperglycemia +/- DKA + coma, severe hypoglycemia, gastroparesis, peripheral neuropathy  . Thyroid disease     hypothyroidism associated with pregnancy  . Back pain   . PONV (postoperative nausea and vomiting)   . Anxiety     hx panic attacks.   Marland Kitchen GERD (gastroesophageal reflux disease)   . DM gastroparesis   . Anemia     .Autoimmune hemolytic anemia. bone marrow bx 2008, Dr Marin Olp  . Colitis, Clostridium difficile 05/2014  . Fatty liver 01/2007    with hepatomegaly on CT scan.    Past Surgical History  Procedure Laterality Date  . Laparoscopy  02/2002, 06/2010    laparoscopy with lysis pelvic adhesions for pelvic pain  . Back surgery      age 56  . Tooth extraction    . Root canal  10/10/12  . Flexible sigmoidoscopy N/A 06/24/2014    Procedure: FLEXIBLE SIGMOIDOSCOPY;  Surgeon: Milus Banister, MD;  Location: [facility] ENDOSCOPY;  Service: Endoscopy;  Laterality: N/A;  . Esophagogastroduodenoscopy (egd) with propofol N/A 06/27/2014    Procedure: ESOPHAGOGASTRODUODENOSCOPY (EGD) WITH PROPOFOL;  Surgeon: Milus Banister, MD;  Location: [facility] ENDOS[facility];  Service: Endoscopy;  Laterality: N/A;   Family History  Problem Relation Age of Onset  . Diabetes Maternal Grandmother   . Cancer Maternal Grandmother     Leukemia  . Cancer Maternal Grandfather     Small cell lung cancer   History   Substance Use Topics  . Smoking status: Former Smoker    Quit date: 04/19/2000  . Smokeless tobacco: Former Systems developer  . Alcohol Use: No     Comment: RARE SIPS OF WINE   OB History    No data available     Review of Systems  All other systems reviewed and are negative.     Allergies  Scallops; Morphine and related; Latex; and Other  Home Medications   Prior to Admission medications   Medication Sig Start Date End Date Taking? Authorizing Provider  acetaminophen (TYLENOL) 500 MG tablet Take 500 mg by mouth every 6 (six) hours as needed for moderate pain or headache (headache and stomach pain).    Yes Historical Provider, MD  ALPRAZolam Duanne Moron) 1 MG tablet Take 1 mg by mouth at bedtime as needed for anxiety.   Yes Historical Provider, MD  amitriptyline (ELAVIL) 50 MG tablet Take 2 tablets (100 mg total) by mouth at bedtime. 08/07/14  Yes Arnoldo Morale, MD  CREON 24000 UNITS CPEP TAKE ONE CAPSULE BY MOUTH 3 TIMES A DAY BEFORE MEALS 08/09/14  Yes Historical Provider, MD  cyanocobalamin 500 MCG tablet Take 500 mcg by mouth 2 (two) times daily.   Yes Historical Provider, MD  dicyclomine (BENTYL) 10 MG capsule Take 10 mg by mouth 4 (four) times daily -  before meals and at bedtime.   Yes Historical Provider, MD  feeding supplement, [place], ([place]) LIQD  Take 237 mLs by mouth 3 (three) times daily between meals. 07/06/14  Yes Thurnell Lose, MD  gabapentin (NEURONTIN) 100 MG capsule Take 300 mg by mouth at bedtime.    Yes Historical Provider, MD  glucagon (GLUCAGON EMERGENCY) 1 MG injection Inject 1 mg into the muscle once as needed (severe hypoglycemia). 08/14/14  Yes Arnoldo Morale, MD  insulin aspart (NOVOLOG) 100 UNIT/ML injection Inject 0-5 Units into the skin 3 (three) times daily with meals. 08/01/14  Yes Silver Huguenin Elgergawy, MD  insulin glargine (LANTUS) 100 UNIT/ML injection Inject 0.2 mLs (20 Units total) into the skin every morning. Patient taking differently: Inject 15  Units into the skin every morning.  08/07/14  Yes Arnoldo Morale, MD  loperamide (IMODIUM) 2 MG capsule Take 2 capsules (4 mg total) by mouth 4 (four) times daily -  before meals and at bedtime. 07/06/14  Yes Thurnell Lose, MD  Multiple Vitamin (MULTIVITAMIN WITH MINERALS) TABS Take 1 tablet by mouth every morning. Diabetic support vitamin   Yes Historical Provider, MD  ondansetron (ZOFRAN-ODT) 4 MG disintegrating tablet TAKE 1 TABLET BY MOUTH EVERY 8 HOURS AS NEEDED FOR NAUSEA OR VOMITING 07/06/14  Yes Historical Provider, MD  oxyCODONE-acetaminophen (PERCOCET) 5-325 MG per tablet Take 1 tablet by mouth every 4 (four) hours as needed for severe pain. 07/06/14  Yes Thurnell Lose, MD  saccharomyces boulardii (FLORASTOR) 250 MG capsule Take 1 capsule (250 mg total) by mouth 2 (two) times daily. 07/06/14  Yes Thurnell Lose, MD  traZODone (DESYREL) 50 MG tablet Take 1 tablet (50 mg total) by mouth at bedtime as needed for sleep. 08/07/14  Yes Arnoldo Morale, MD  Acetaminophen-Codeine (TYLENOL/CODEINE #3) 300-30 MG per tablet Take 1 tablet by mouth every 6 (six) hours as needed for pain. Patient not taking: Reported on 08/14/2014 08/07/14   Arnoldo Morale, MD  amoxicillin (AMOXIL) 500 MG capsule Take 1 capsule (500 mg total) by mouth 3 (three) times daily. 08/19/14   Nat Christen, MD  diphenoxylate-atropine (LOMOTIL) 2.5-0.025 MG per tablet Take 2 tablets by mouth 2 (two) times daily. Patient not taking: Reported on 08/19/2014 07/06/14   Thurnell Lose, MD  promethazine (PHENERGAN) 25 MG suppository Place 1 suppository (25 mg total) rectally every 6 (six) hours as needed for nausea or vomiting. 08/19/14   Nat Christen, MD   BP 107/72 mmHg  Pulse 69  Temp(Src) 97.8 F (36.6 C) (Oral)  Resp 18  Ht 5\' 5"  (1.651 m)  Wt 91 lb 6 oz (41.447 kg)  BMI 15.21 kg/m2  SpO2 100%  LMP 01/22/2014 Physical Exam  Constitutional: She is oriented to person, place, and time.  Slightly dehydrated but nontoxic-appearing  HENT:   Head: Normocephalic and atraumatic.  Chipped tooth right lower mandible  Eyes: Conjunctivae and EOM are normal. Pupils are equal, round, and reactive to light.  Neck: Normal range of motion. Neck supple.  Cardiovascular: Normal rate and regular rhythm.   Pulmonary/Chest: Effort normal and breath sounds normal.  Abdominal: Soft. Bowel sounds are normal.  Musculoskeletal: Normal range of motion.  Neurological: She is alert and oriented to person, place, and time.  Skin: Skin is warm and dry.  Psychiatric: She has a normal mood and affect. Her behavior is normal.  Nursing note and vitals reviewed.   ED Course  Procedures (including critical care time) Labs Review Labs Reviewed  BASIC METABOLIC PANEL - Abnormal; Notable for the following:    Chloride 99 (*)    Glucose, Bld  439 (*)    All other components within normal limits  URINALYSIS, ROUTINE W REFLEX MICROSCOPIC (NOT AT [facility]) - Abnormal; Notable for the following:    Glucose, UA >1000 (*)    All other components within normal limits  URINE MICROSCOPIC-ADD ON - Abnormal; Notable for the following:    Squamous Epithelial / LPF FEW (*)    Bacteria, UA FEW (*)    All other components within normal limits  CBG MONITORING, ED - Abnormal; Notable for the following:    Glucose-Capillary 138 (*)    All other components within normal limits  CBG MONITORING, ED - Abnormal; Notable for the following:    Glucose-Capillary 183 (*)    All other components within normal limits  URINE CULTURE  CBC WITH DIFFERENTIAL/PLATELET    Imaging Review No results found.   EKG Interpretation None      MDM   Final diagnoses:  Type 1 diabetes mellitus with complications  Toothache    Patient feels much better after 3 L of IV fluid, IV Dilaudid, IV Zofran. She is not in DKA. Discharge medications Phenergan 25 mg suppository and amoxicillin 500 mg    Nat Christen, MD 08/19/14 2219

## 2014-08-19 NOTE — Discharge Instructions (Signed)
Prescription for antibiotic and Phenergan suppositories. Follow-up your primary care doctor.

## 2014-08-19 NOTE — ED Notes (Signed)
cbg of 183.

## 2014-08-19 NOTE — ED Notes (Signed)
Pt alert & oriented x4, stable gait. Patient given discharge instructions, paperwork & prescription(s). Patient  instructed to stop at the registration desk to finish any additional paperwork. Patient verbalized understanding. Pt left department w/ no further questions. 

## 2014-08-19 NOTE — ED Notes (Signed)
2 days of n/v/d and abdominal pain. Patient is brittle diabetic, has gastroparesis and pancreatic insufficiency.  States she has problems controlling glucose and frequently goes into DKA.

## 2014-08-21 LAB — URINE CULTURE: Culture: 4000

## 2014-08-22 ENCOUNTER — Telehealth: Payer: Self-pay | Admitting: Internal Medicine

## 2014-08-22 ENCOUNTER — Telehealth: Payer: Self-pay

## 2014-08-22 NOTE — Telephone Encounter (Signed)
Call placed to patient to follow-up on condition. Patient was in the ED on 08/19/14 for nausea, vomiting, diarrhea. Found to be dehydrated. Discharged on Phenergan suppository 25 mg every six hours as needed for nausea or vomiting and Amoxicillin 500 mg three times daily. Patient's [facility] appointment on 09/04/14 at 1400 with Dr. Jarold Song. Unable to reach patient; voicemail left requesting return call.

## 2014-08-22 NOTE — Telephone Encounter (Signed)
Patient is returning phone call from case manager, please f/u

## 2014-08-23 ENCOUNTER — Telehealth: Payer: Self-pay

## 2014-08-23 NOTE — Telephone Encounter (Signed)
Call placed to [name] to follow-up on how she is doing.  [name] went to ED on 08/19/14 for nausea/vomiting/diarrhea.  [name] was found to be dehydrated and given 3L of fluid. She indicates she is "feeling a lot better" since she went to ED. She indicates she has a had "a little" vomiting but nothing like it was on 08/19/14, and she is taking her amoxicillin 500 mg three times daily and phenergan suppository 25 mg every 6 hours PRN as needed for nausea and vomiting. [name] indicates she is taking her medications as prescribed. [name] also given her lab results from her appointment on 08/14/14. [name] informed her labwork was normal, and HIV negative. [name] appreciative of information.  [name] reminded of [facility] appointment on 09/04/14 at 1400 with Dr. Jarold Song. [name] aware of appointment. No additional needs identified.

## 2014-08-27 ENCOUNTER — Telehealth: Payer: Self-pay

## 2014-08-27 NOTE — Telephone Encounter (Signed)
Called the patient to check on her status.  She said that she is " doing all right" and she is on her way back from vacation.  No other concerns reported.

## 2014-08-30 ENCOUNTER — Emergency Department (HOSPITAL_COMMUNITY)
Admission: EM | Admit: 2014-08-30 | Discharge: 2014-08-30 | Disposition: A | Discharge status: Home / Self Care | Payer: Medicaid Other | Attending: Emergency Medicine | Admitting: Emergency Medicine

## 2014-08-30 ENCOUNTER — Encounter (HOSPITAL_COMMUNITY): Payer: Self-pay

## 2014-08-30 DIAGNOSIS — E1043 Type 1 diabetes mellitus with diabetic autonomic (poly)neuropathy: Secondary | ICD-10-CM | POA: Insufficient documentation

## 2014-08-30 DIAGNOSIS — R1084 Generalized abdominal pain: Secondary | ICD-10-CM | POA: Diagnosis not present

## 2014-08-30 DIAGNOSIS — Z8719 Personal history of other diseases of the digestive system: Secondary | ICD-10-CM | POA: Diagnosis not present

## 2014-08-30 DIAGNOSIS — Z87891 Personal history of nicotine dependence: Secondary | ICD-10-CM | POA: Insufficient documentation

## 2014-08-30 DIAGNOSIS — Z91040 Latex allergy status: Secondary | ICD-10-CM | POA: Diagnosis not present

## 2014-08-30 DIAGNOSIS — Z862 Personal history of diseases of the blood and blood-forming organs and certain disorders involving the immune mechanism: Secondary | ICD-10-CM | POA: Diagnosis not present

## 2014-08-30 DIAGNOSIS — E1065 Type 1 diabetes mellitus with hyperglycemia: Secondary | ICD-10-CM | POA: Diagnosis not present

## 2014-08-30 DIAGNOSIS — Z3202 Encounter for pregnancy test, result negative: Secondary | ICD-10-CM | POA: Insufficient documentation

## 2014-08-30 DIAGNOSIS — Z794 Long term (current) use of insulin: Secondary | ICD-10-CM | POA: Insufficient documentation

## 2014-08-30 DIAGNOSIS — Z9889 Other specified postprocedural states: Secondary | ICD-10-CM | POA: Diagnosis not present

## 2014-08-30 DIAGNOSIS — R112 Nausea with vomiting, unspecified: Secondary | ICD-10-CM | POA: Insufficient documentation

## 2014-08-30 DIAGNOSIS — F419 Anxiety disorder, unspecified: Secondary | ICD-10-CM | POA: Diagnosis not present

## 2014-08-30 DIAGNOSIS — Z79899 Other long term (current) drug therapy: Secondary | ICD-10-CM | POA: Insufficient documentation

## 2014-08-30 DIAGNOSIS — Z8619 Personal history of other infectious and parasitic diseases: Secondary | ICD-10-CM | POA: Diagnosis not present

## 2014-08-30 DIAGNOSIS — R630 Anorexia: Secondary | ICD-10-CM | POA: Insufficient documentation

## 2014-08-30 DIAGNOSIS — R739 Hyperglycemia, unspecified: Secondary | ICD-10-CM

## 2014-08-30 LAB — CBC WITH DIFFERENTIAL/PLATELET
Basophils Absolute: 0 10*3/uL (ref 0.0–0.1)
Basophils Relative: 0 % (ref 0–1)
EOS PCT: 1 % (ref 0–5)
Eosinophils Absolute: 0.1 10*3/uL (ref 0.0–0.7)
HCT: 38.5 % (ref 36.0–46.0)
Hemoglobin: 13.1 g/dL (ref 12.0–15.0)
LYMPHS PCT: 42 % (ref 12–46)
Lymphs Abs: 2.4 10*3/uL (ref 0.7–4.0)
MCH: 30.3 pg (ref 26.0–34.0)
MCHC: 34 g/dL (ref 30.0–36.0)
MCV: 88.9 fL (ref 78.0–100.0)
Monocytes Absolute: 0.2 10*3/uL (ref 0.1–1.0)
Monocytes Relative: 4 % (ref 3–12)
Neutro Abs: 3 10*3/uL (ref 1.7–7.7)
Neutrophils Relative %: 53 % (ref 43–77)
Platelets: 187 10*3/uL (ref 150–400)
RBC: 4.33 MIL/uL (ref 3.87–5.11)
RDW: 12.5 % (ref 11.5–15.5)
WBC: 5.6 10*3/uL (ref 4.0–10.5)

## 2014-08-30 LAB — CBG MONITORING, ED
GLUCOSE-CAPILLARY: 409 mg/dL — AB (ref 65–99)
GLUCOSE-CAPILLARY: 347 mg/dL — AB (ref 65–99)
Glucose-Capillary: 440 mg/dL — ABNORMAL HIGH (ref 65–99)

## 2014-08-30 LAB — COMPREHENSIVE METABOLIC PANEL
ALK PHOS: 70 U/L (ref 38–126)
ALT: 38 U/L (ref 14–54)
AST: 41 U/L (ref 15–41)
Albumin: 4.9 g/dL (ref 3.5–5.0)
Anion gap: 11 (ref 5–15)
BILIRUBIN TOTAL: 0.5 mg/dL (ref 0.3–1.2)
BUN: 13 mg/dL (ref 6–20)
CHLORIDE: 96 mmol/L — AB (ref 101–111)
CO2: 23 mmol/L (ref 22–32)
CREATININE: 0.83 mg/dL (ref 0.44–1.00)
Calcium: 9.4 mg/dL (ref 8.9–10.3)
GFR calc non Af Amer: >60 mL/min (ref >60)
GLUCOSE: 471 mg/dL — AB (ref 65–99)
Potassium: 4.4 mmol/L (ref 3.5–5.1)
Sodium: 130 mmol/L — ABNORMAL LOW (ref 135–145)
Total Protein: 8.3 g/dL — ABNORMAL HIGH (ref 6.5–8.1)

## 2014-08-30 LAB — URINALYSIS, ROUTINE W REFLEX MICROSCOPIC
Bilirubin Urine: NEGATIVE
Hgb urine dipstick: NEGATIVE
KETONES UR: NEGATIVE mg/dL
Leukocytes, UA: NEGATIVE
Nitrite: NEGATIVE
PROTEIN: NEGATIVE mg/dL
Specific Gravity, Urine: 1.04 — ABNORMAL HIGH (ref 1.005–1.030)
Urobilinogen, UA: 0.2 mg/dL (ref 0.0–1.0)
pH: 6 (ref 5.0–8.0)

## 2014-08-30 LAB — POC URINE PREG, ED: Preg Test, Ur: NEGATIVE

## 2014-08-30 LAB — URINE MICROSCOPIC-ADD ON

## 2014-08-30 MED ORDER — SODIUM CHLORIDE 0.9 % IV BOLUS (SEPSIS)
1000.0000 mL | Freq: Once | INTRAVENOUS | Status: AC
  Administered 2014-08-30: 1000 mL via INTRAVENOUS

## 2014-08-30 MED ORDER — INSULIN ASPART 100 UNIT/ML IV SOLN: 6.0000 [IU] | Freq: Once | INTRAVENOUS | Status: DC

## 2014-08-30 MED ORDER — HYDROMORPHONE HCL 1 MG/ML IJ SOLN
1.0000 mg | Freq: Once | INTRAMUSCULAR | Status: AC
  Administered 2014-08-30: 1 mg via INTRAVENOUS
  Filled 2014-08-30: qty 1

## 2014-08-30 MED ORDER — METOCLOPRAMIDE HCL 5 MG/ML IJ SOLN
10.0000 mg | Freq: Once | INTRAMUSCULAR | Status: AC
  Administered 2014-08-30: 10 mg via INTRAVENOUS
  Filled 2014-08-30: qty 2

## 2014-08-30 MED ORDER — DIPHENHYDRAMINE HCL 50 MG/ML IJ SOLN
25.0000 mg | Freq: Once | INTRAMUSCULAR | Status: AC
  Administered 2014-08-30: 25 mg via INTRAVENOUS
  Filled 2014-08-30: qty 1

## 2014-08-30 MED ORDER — INSULIN ASPART 100 UNIT/ML IV SOLN
4.0000 [IU] | Freq: Once | INTRAVENOUS | Status: DC
  Filled 2014-08-30: qty 0.04

## 2014-08-30 NOTE — ED Notes (Signed)
Pt reports pain has increased to 8/10. MD notified.

## 2014-08-30 NOTE — ED Notes (Signed)
Made two unsuccessful attempts to draw pt's blood

## 2014-08-30 NOTE — ED Provider Notes (Signed)
Patient signed out to me by Dr. Colin Rhein. Patient was seen for evaluation of elevated blood sugar. Patient has a history of diabetes. She also apparently has a history of chronic abdominal pain. She was seen for abdominal pain earlier and it was felt to be secondary to her chronic pain. Patient refused insulin. She was to be given 2 L of normal saline solution and have her blood sugar rechecked. Blood sugar is in the 300s range at this point. She is beginning to ask for repeat doses of pain medication, will be discharged to take her home pain meds and monitor her blood sugar at home.  Orpah Greek, MD 08/30/14 [phone]

## 2014-08-30 NOTE — Discharge Instructions (Signed)
Hyperglycemia °Hyperglycemia occurs when the glucose (sugar) in your blood is too high. Hyperglycemia can happen for many reasons, but it most often happens to people who do not know they have diabetes or are not managing their diabetes properly.  °CAUSES  °Whether you have diabetes or not, there are other causes of hyperglycemia. Hyperglycemia can occur when you have diabetes, but it can also occur in other situations that you might not be as aware of, such as: °Diabetes °· If you have diabetes and are having problems controlling your blood glucose, hyperglycemia could occur because of some of the following reasons: °· Not following your meal plan. °· Not taking your diabetes medications or not taking it properly. °· Exercising less or doing less activity than you normally do. °· Being sick. °Pre-diabetes °· This cannot be ignored. Before people develop Type 2 diabetes, they almost always have "pre-diabetes." This is when your blood glucose levels are higher than normal, but not yet high enough to be diagnosed as diabetes. Research has shown that some long-term damage to the body, especially the heart and circulatory system, may already be occurring during pre-diabetes. If you take action to manage your blood glucose when you have pre-diabetes, you may delay or prevent Type 2 diabetes from developing. °Stress °· If you have diabetes, you may be "diet" controlled or on oral medications or insulin to control your diabetes. However, you may find that your blood glucose is higher than usual in the hospital whether you have diabetes or not. This is often referred to as "stress hyperglycemia." Stress can elevate your blood glucose. This happens because of hormones put out by the body during times of stress. If stress has been the cause of your high blood glucose, it can be followed regularly by your caregiver. That way he/she can make sure your hyperglycemia does not continue to get worse or progress to  diabetes. °Steroids °· Steroids are medications that act on the infection fighting system (immune system) to block inflammation or infection. One side effect can be a rise in blood glucose. Most people can produce enough extra insulin to allow for this rise, but for those who cannot, steroids make blood glucose levels go even higher. It is not unusual for steroid treatments to "uncover" diabetes that is developing. It is not always possible to determine if the hyperglycemia will go away after the steroids are stopped. A special blood test called an A1c is sometimes done to determine if your blood glucose was elevated before the steroids were started. °SYMPTOMS °· Thirsty. °· Frequent urination. °· Dry mouth. °· Blurred vision. °· Tired or fatigue. °· Weakness. °· Sleepy. °· Tingling in feet or leg. °DIAGNOSIS  °Diagnosis is made by monitoring blood glucose in one or all of the following ways: °· A1c test. This is a chemical found in your blood. °· Fingerstick blood glucose monitoring. °· Laboratory results. °TREATMENT  °First, knowing the cause of the hyperglycemia is important before the hyperglycemia can be treated. Treatment may include, but is not be limited to: °· Education. °· Change or adjustment in medications. °· Change or adjustment in meal plan. °· Treatment for an illness, infection, etc. °· More frequent blood glucose monitoring. °· Change in exercise plan. °· Decreasing or stopping steroids. °· Lifestyle changes. °HOME CARE INSTRUCTIONS  °· Test your blood glucose as directed. °· Exercise regularly. Your caregiver will give you instructions about exercise. Pre-diabetes or diabetes which comes on with stress is helped by exercising. °· Eat wholesome,   balanced meals. Eat often and at regular, fixed times. Your caregiver or nutritionist will give you a meal plan to guide your sugar intake.  Being at an ideal weight is important. If needed, losing as little as 10 to 15 pounds may help improve blood  glucose levels. SEEK MEDICAL CARE IF:   You have questions about medicine, activity, or diet.  You continue to have symptoms (problems such as increased thirst, urination, or weight gain). SEEK IMMEDIATE MEDICAL CARE IF:   You are vomiting or have diarrhea.  Your breath smells fruity.  You are breathing faster or slower.  You are very sleepy or incoherent.  You have numbness, tingling, or pain in your feet or hands.  You have chest pain.  Your symptoms get worse even though you have been following your caregiver's orders.  If you have any other questions or concerns. Document Released: 08/04/2000 Document Revised: 05/03/2011 Document Reviewed: 06/07/2011 [facility] Patient Information 2015 [place], [place]. This information is not intended to replace advice given to you by your health care provider. Make sure you discuss any questions you have with your health care provider.  Chronic Pain Chronic pain can be defined as pain that is off and on and lasts for 3-6 months or longer. Many things cause chronic pain, which can make it difficult to make a diagnosis. There are many treatment options available for chronic pain. However, finding a treatment that works well for you may require trying various approaches until the right one is found. Many people benefit from a combination of two or more types of treatment to control their pain. SYMPTOMS  Chronic pain can occur anywhere in the body and can range from mild to very severe. Some types of chronic pain include:  Headache.  Low back pain.  Cancer pain.  Arthritis pain.  Neurogenic pain. This is pain resulting from damage to nerves. People with chronic pain may also have other symptoms such as:  Depression.  Anger.  Insomnia.  Anxiety. DIAGNOSIS  Your health care provider will help diagnose your condition over time. In many cases, the initial focus will be on excluding possible conditions that could be causing the pain.  Depending on your symptoms, your health care provider may order tests to diagnose your condition. Some of these tests may include:   Blood tests.   CT scan.   MRI.   X-rays.   Ultrasounds.   Nerve conduction studies.  You may need to see a specialist.  TREATMENT  Finding treatment that works well may take time. You may be referred to a pain specialist. He or she may prescribe medicine or therapies, such as:   Mindful meditation or yoga.  Shots (injections) of numbing or pain-relieving medicines into the spine or area of pain.  Local electrical stimulation.  Acupuncture.   Massage therapy.   Aroma, color, light, or sound therapy.   Biofeedback.   Working with a physical therapist to keep from getting stiff.   Regular, gentle exercise.   Cognitive or behavioral therapy.   Group support.  Sometimes, surgery may be recommended.  HOME CARE INSTRUCTIONS   Take all medicines as directed by your health care provider.   Lessen stress in your life by relaxing and doing things such as listening to calming music.   Exercise or be active as directed by your health care provider.   Eat a healthy diet and include things such as vegetables, fruits, fish, and lean meats in your diet.   Keep all  follow-up appointments with your health care provider.   Attend a support group with others suffering from chronic pain. SEEK MEDICAL CARE IF:   Your pain gets worse.   You develop a new pain that was not there before.   You cannot tolerate medicines given to you by your health care provider.   You have new symptoms since your last visit with your health care provider.  SEEK IMMEDIATE MEDICAL CARE IF:   You feel weak.   You have decreased sensation or numbness.   You lose control of bowel or bladder function.   Your pain suddenly gets much worse.   You develop shaking.  You develop chills.  You develop confusion.  You develop chest  pain.  You develop shortness of breath.  MAKE SURE YOU:  Understand these instructions.  Will watch your condition.  Will get help right away if you are not doing well or get worse. Document Released: 10/31/2001 Document Revised: 10/11/2012 Document Reviewed: 08/04/2012 [facility] Patient Information 2015 [place], [place]. This information is not intended to replace advice given to you by your health care provider. Make sure you discuss any questions you have with your health care provider.

## 2014-08-30 NOTE — ED Notes (Signed)
Pt presents with c/o abdominal pain consistently for the past several days. Pt also reporting hyperglycemia, reports her meter has been reading in the 400's this morning. Pt reports she has been feeling lethargic and weak.

## 2014-09-02 ENCOUNTER — Emergency Department (HOSPITAL_COMMUNITY)
Admission: EM | Admit: 2014-09-02 | Discharge: 2014-09-02 | Disposition: A | Discharge status: Home / Self Care | Payer: Medicaid Other | Attending: Emergency Medicine | Admitting: Emergency Medicine

## 2014-09-02 ENCOUNTER — Encounter (HOSPITAL_COMMUNITY): Payer: Self-pay

## 2014-09-02 ENCOUNTER — Telehealth: Payer: Self-pay

## 2014-09-02 DIAGNOSIS — Z792 Long term (current) use of antibiotics: Secondary | ICD-10-CM | POA: Diagnosis not present

## 2014-09-02 DIAGNOSIS — Z794 Long term (current) use of insulin: Secondary | ICD-10-CM | POA: Diagnosis not present

## 2014-09-02 DIAGNOSIS — D591 Other autoimmune hemolytic anemias: Secondary | ICD-10-CM | POA: Insufficient documentation

## 2014-09-02 DIAGNOSIS — F410 Panic disorder [episodic paroxysmal anxiety] without agoraphobia: Secondary | ICD-10-CM | POA: Diagnosis not present

## 2014-09-02 DIAGNOSIS — Z8619 Personal history of other infectious and parasitic diseases: Secondary | ICD-10-CM | POA: Insufficient documentation

## 2014-09-02 DIAGNOSIS — Z87891 Personal history of nicotine dependence: Secondary | ICD-10-CM | POA: Insufficient documentation

## 2014-09-02 DIAGNOSIS — E1065 Type 1 diabetes mellitus with hyperglycemia: Secondary | ICD-10-CM | POA: Insufficient documentation

## 2014-09-02 DIAGNOSIS — K219 Gastro-esophageal reflux disease without esophagitis: Secondary | ICD-10-CM | POA: Insufficient documentation

## 2014-09-02 DIAGNOSIS — R739 Hyperglycemia, unspecified: Secondary | ICD-10-CM

## 2014-09-02 DIAGNOSIS — K088 Other specified disorders of teeth and supporting structures: Secondary | ICD-10-CM | POA: Insufficient documentation

## 2014-09-02 DIAGNOSIS — R1030 Lower abdominal pain, unspecified: Secondary | ICD-10-CM | POA: Diagnosis not present

## 2014-09-02 DIAGNOSIS — K0889 Other specified disorders of teeth and supporting structures: Secondary | ICD-10-CM

## 2014-09-02 DIAGNOSIS — Z79899 Other long term (current) drug therapy: Secondary | ICD-10-CM | POA: Diagnosis not present

## 2014-09-02 LAB — BASIC METABOLIC PANEL
Anion gap: 10 (ref 5–15)
BUN: 12 mg/dL (ref 6–20)
CO2: 26 mmol/L (ref 22–32)
Calcium: 9.4 mg/dL (ref 8.9–10.3)
Chloride: 96 mmol/L — ABNORMAL LOW (ref 101–111)
Creatinine, Ser: 0.79 mg/dL (ref 0.44–1.00)
GFR calc Af Amer: >60 mL/min (ref >60)
GFR calc non Af Amer: >60 mL/min (ref >60)
Glucose, Bld: 567 mg/dL (ref 65–99)
POTASSIUM: 5.3 mmol/L — AB (ref 3.5–5.1)
Sodium: 132 mmol/L — ABNORMAL LOW (ref 135–145)

## 2014-09-02 LAB — CBG MONITORING, ED
GLUCOSE-CAPILLARY: 497 mg/dL — AB (ref 65–99)
Glucose-Capillary: 383 mg/dL — ABNORMAL HIGH (ref 65–99)

## 2014-09-02 LAB — BLOOD GAS, VENOUS
Acid-Base Excess: 1.3 mmol/L (ref 0.0–2.0)
Bicarbonate: 27.2 mEq/L — ABNORMAL HIGH (ref 20.0–24.0)
FIO2: 0.21 %
O2 Saturation: 30.4 %
PATIENT TEMPERATURE: 98.6
PCO2 VEN: 51 mmHg — AB (ref 45.0–50.0)
PH VEN: 7.346 — AB (ref 7.250–7.300)
TCO2: 25.1 mmol/L (ref 0–100)

## 2014-09-02 LAB — URINE MICROSCOPIC-ADD ON

## 2014-09-02 LAB — URINALYSIS, ROUTINE W REFLEX MICROSCOPIC
Bilirubin Urine: NEGATIVE
Glucose, UA: >1000 mg/dL — AB
HGB URINE DIPSTICK: NEGATIVE
KETONES UR: NEGATIVE mg/dL
LEUKOCYTES UA: NEGATIVE
NITRITE: NEGATIVE
PH: 5.5 (ref 5.0–8.0)
PROTEIN: NEGATIVE mg/dL
Specific Gravity, Urine: 1.035 — ABNORMAL HIGH (ref 1.005–1.030)
UROBILINOGEN UA: 0.2 mg/dL (ref 0.0–1.0)

## 2014-09-02 LAB — CBC
HCT: 38.2 % (ref 36.0–46.0)
Hemoglobin: 12.8 g/dL (ref 12.0–15.0)
MCH: 30 pg (ref 26.0–34.0)
MCHC: 33.5 g/dL (ref 30.0–36.0)
MCV: 89.5 fL (ref 78.0–100.0)
Platelets: 223 10*3/uL (ref 150–400)
RBC: 4.27 MIL/uL (ref 3.87–5.11)
RDW: 12.8 % (ref 11.5–15.5)
WBC: 6.3 10*3/uL (ref 4.0–10.5)

## 2014-09-02 MED ORDER — HYDROMORPHONE HCL 1 MG/ML IJ SOLN
1.0000 mg | Freq: Once | INTRAMUSCULAR | Status: AC
  Administered 2014-09-02: 1 mg via INTRAVENOUS
  Filled 2014-09-02: qty 1

## 2014-09-02 MED ORDER — SODIUM CHLORIDE 0.9 % IV BOLUS (SEPSIS)
1000.0000 mL | Freq: Once | INTRAVENOUS | Status: AC
Start: 2014-09-02 — End: 2014-09-02
  Administered 2014-09-02: 1000 mL via INTRAVENOUS

## 2014-09-02 MED ORDER — INSULIN ASPART 100 UNIT/ML ~~LOC~~ SOLN
10.0000 [IU] | Freq: Once | SUBCUTANEOUS | Status: AC
  Administered 2014-09-02: 10 [IU] via SUBCUTANEOUS
  Filled 2014-09-02: qty 1

## 2014-09-02 MED ORDER — SODIUM CHLORIDE 0.9 % IV BOLUS (SEPSIS)
1000.0000 mL | Freq: Once | INTRAVENOUS | Status: AC
  Administered 2014-09-02: 1000 mL via INTRAVENOUS

## 2014-09-02 NOTE — Discharge Instructions (Signed)
Dental Pain A tooth ache may be caused by cavities (tooth decay). Cavities expose the nerve of the tooth to air and hot or cold temperatures. It may come from an infection or abscess (also called a boil or furuncle) around your tooth. It is also often caused by dental caries (tooth decay). This causes the pain you are having. DIAGNOSIS  Your caregiver can diagnose this problem by exam. TREATMENT   If caused by an infection, it may be treated with medications which kill germs (antibiotics) and pain medications as prescribed by your caregiver. Take medications as directed.  Only take over-the-counter or prescription medicines for pain, discomfort, or fever as directed by your caregiver.  Whether the tooth ache today is caused by infection or dental disease, you should see your dentist as soon as possible for further care. SEEK MEDICAL CARE IF: The exam and treatment you received today has been provided on an emergency basis only. This is not a substitute for complete medical or dental care. If your problem worsens or new problems (symptoms) appear, and you are unable to meet with your dentist, call or return to this location. SEEK IMMEDIATE MEDICAL CARE IF:   You have a fever.  You develop redness and swelling of your face, jaw, or neck.  You are unable to open your mouth.  You have severe pain uncontrolled by pain medicine. MAKE SURE YOU:   Understand these instructions.  Will watch your condition.  Will get help right away if you are not doing well or get worse. Document Released: 02/08/2005 Document Revised: 05/03/2011 Document Reviewed: 09/27/2007 [facility] Patient Information 2015 [place], [place]. This information is not intended to replace advice given to you by your health care provider. Make sure you discuss any questions you have with your health care provider.  Hyperglycemia Hyperglycemia occurs when the glucose (sugar) in your blood is too high. Hyperglycemia can happen for  many reasons, but it most often happens to people who do not know they have diabetes or are not managing their diabetes properly.  CAUSES  Whether you have diabetes or not, there are other causes of hyperglycemia. Hyperglycemia can occur when you have diabetes, but it can also occur in other situations that you might not be as aware of, such as: Diabetes  If you have diabetes and are having problems controlling your blood glucose, hyperglycemia could occur because of some of the following reasons:  Not following your meal plan.  Not taking your diabetes medications or not taking it properly.  Exercising less or doing less activity than you normally do.  Being sick. Pre-diabetes  This cannot be ignored. Before people develop Type 2 diabetes, they almost always have "pre-diabetes." This is when your blood glucose levels are higher than normal, but not yet high enough to be diagnosed as diabetes. Research has shown that some long-term damage to the body, especially the heart and circulatory system, may already be occurring during pre-diabetes. If you take action to manage your blood glucose when you have pre-diabetes, you may delay or prevent Type 2 diabetes from developing. Stress  If you have diabetes, you may be "diet" controlled or on oral medications or insulin to control your diabetes. However, you may find that your blood glucose is higher than usual in the hospital whether you have diabetes or not. This is often referred to as "stress hyperglycemia." Stress can elevate your blood glucose. This happens because of hormones put out by the body during times of stress. If stress  has been the cause of your high blood glucose, it can be followed regularly by your caregiver. That way he/she can make sure your hyperglycemia does not continue to get worse or progress to diabetes. Steroids  Steroids are medications that act on the infection fighting system (immune system) to block inflammation or  infection. One side effect can be a rise in blood glucose. Most people can produce enough extra insulin to allow for this rise, but for those who cannot, steroids make blood glucose levels go even higher. It is not unusual for steroid treatments to "uncover" diabetes that is developing. It is not always possible to determine if the hyperglycemia will go away after the steroids are stopped. A special blood test called an A1c is sometimes done to determine if your blood glucose was elevated before the steroids were started. SYMPTOMS  Thirsty.  Frequent urination.  Dry mouth.  Blurred vision.  Tired or fatigue.  Weakness.  Sleepy.  Tingling in feet or leg. DIAGNOSIS  Diagnosis is made by monitoring blood glucose in one or all of the following ways:  A1c test. This is a chemical found in your blood.  Fingerstick blood glucose monitoring.  Laboratory results. TREATMENT  First, knowing the cause of the hyperglycemia is important before the hyperglycemia can be treated. Treatment may include, but is not be limited to:  Education.  Change or adjustment in medications.  Change or adjustment in meal plan.  Treatment for an illness, infection, etc.  More frequent blood glucose monitoring.  Change in exercise plan.  Decreasing or stopping steroids.  Lifestyle changes. HOME CARE INSTRUCTIONS   Test your blood glucose as directed.  Exercise regularly. Your caregiver will give you instructions about exercise. Pre-diabetes or diabetes which comes on with stress is helped by exercising.  Eat wholesome, balanced meals. Eat often and at regular, fixed times. Your caregiver or nutritionist will give you a meal plan to guide your sugar intake.  Being at an ideal weight is important. If needed, losing as little as 10 to 15 pounds may help improve blood glucose levels. SEEK MEDICAL CARE IF:   You have questions about medicine, activity, or diet.  You continue to have symptoms  (problems such as increased thirst, urination, or weight gain). SEEK IMMEDIATE MEDICAL CARE IF:   You are vomiting or have diarrhea.  Your breath smells fruity.  You are breathing faster or slower.  You are very sleepy or incoherent.  You have numbness, tingling, or pain in your feet or hands.  You have chest pain.  Your symptoms get worse even though you have been following your caregiver's orders.  If you have any other questions or concerns. Document Released: 08/04/2000 Document Revised: 05/03/2011 Document Reviewed: 06/07/2011 [facility] Patient Information 2015 [place], [place]. This information is not intended to replace advice given to you by your health care provider. Make sure you discuss any questions you have with your health care provider.

## 2014-09-02 NOTE — ED Notes (Signed)
Pt had extensive dental work done last week and is in severe pain and is unable to eat, she is a diabetic and her blood sugars are all over the place and she doesn't feel good, she thinks she might be in DKA

## 2014-09-02 NOTE — Telephone Encounter (Signed)
Called the patient to check on her status since her ED visit on 7/8. She informed this CM that she was on her way to the ED because she has a "jaw infection."   She said that she just had some dental work done and she called her dentist and he told her to go to the ED right away.  CM to follow up w/ her tomorrow.

## 2014-09-02 NOTE — ED Provider Notes (Signed)
CSN: [id number]     Arrival date & time 09/02/14  1839 History   First MD Initiated Contact with Patient 09/02/14 1951     Chief Complaint  Patient presents with  . Blood Sugar Problem     (Consider location/radiation/quality/duration/timing/severity/associated sxs/prior Treatment) Patient is a 34 y.o. female presenting with hyperglycemia. The history is provided by the patient.  Hyperglycemia Blood sugar level PTA:  >600 Severity:  Severe Onset quality:  Sudden Timing:  Constant Progression:  Waxing and waning Chronicity:  Chronic Diabetes status:  Controlled with insulin Current diabetic therapy:  Lantus 15 units at night, SSI throughout the day Relieved by:  Nothing Ineffective treatments:  None tried Associated symptoms: nausea   Associated symptoms: no abdominal pain, no fever, no shortness of breath and no vomiting     Past Medical History  Diagnosis Date  . DM type 1 causing complication dx 0865    hyperglycemia +/- DKA + coma, severe hypoglycemia, gastroparesis, peripheral neuropathy  . Thyroid disease     hypothyroidism associated with pregnancy  . Back pain   . PONV (postoperative nausea and vomiting)   . Anxiety     hx panic attacks.   Marland Kitchen GERD (gastroesophageal reflux disease)   . DM gastroparesis   . Anemia     .Autoimmune hemolytic anemia. bone marrow bx 2008, Dr Marin Olp  . Colitis, Clostridium difficile 05/2014  . Fatty liver 01/2007    with hepatomegaly on CT scan.    Past Surgical History  Procedure Laterality Date  . Laparoscopy  02/2002, 06/2010    laparoscopy with lysis pelvic adhesions for pelvic pain  . Back surgery      age 41  . Tooth extraction    . Root canal  10/10/12  . Flexible sigmoidoscopy N/A 06/24/2014    Procedure: FLEXIBLE SIGMOIDOSCOPY;  Surgeon: Milus Banister, MD;  Location: [facility];  Service: Endoscopy;  Laterality: N/A;  . Esophagogastroduodenoscopy (egd) with propofol N/A 06/27/2014    Procedure: ESOPHAGOGASTRODUODENOSCOPY  (EGD) WITH PROPOFOL;  Surgeon: Milus Banister, MD;  Location: [facility];  Service: Endoscopy;  Laterality: N/A;   Family History  Problem Relation Age of Onset  . Diabetes Maternal Grandmother   . Cancer Maternal Grandmother     Leukemia  . Cancer Maternal Grandfather     Small cell lung cancer   History  Substance Use Topics  . Smoking status: Former Smoker    Quit date: 04/19/2000  . Smokeless tobacco: Former Systems developer  . Alcohol Use: No     Comment: RARE SIPS OF WINE   OB History    No data available     Review of Systems  Constitutional: Negative for fever.  HENT: Positive for dental problem.   Respiratory: Negative for cough and shortness of breath.   Gastrointestinal: Positive for nausea. Negative for vomiting and abdominal pain.  All other systems reviewed and are negative.     Allergies  Scallops; Morphine and related; Latex; and Other  Home Medications   Prior to Admission medications   Medication Sig Start Date End Date Taking? Authorizing Provider  acetaminophen (TYLENOL) 500 MG tablet Take 500 mg by mouth every 6 (six) hours as needed for moderate pain or headache.    Yes Historical Provider, MD  ALPRAZolam Duanne Moron) 1 MG tablet Take 1 mg by mouth at bedtime.    Yes Historical Provider, MD  amitriptyline (ELAVIL) 50 MG tablet Take 2 tablets (100 mg total) by mouth at bedtime. 08/07/14  Yes Enobong  Amao, MD  CALCIUM PO Take 2 tablets by mouth daily.   Yes Historical Provider, MD  CREON 24000 UNITS CPEP TAKE 03524 UNITS WITH EVERY MEAL AND 81859 UNITS WITH EVERY SNACK 08/09/14  Yes Historical Provider, MD  cyanocobalamin 500 MCG tablet Take 500 mcg by mouth 2 (two) times daily.   Yes Historical Provider, MD  dicyclomine (BENTYL) 10 MG capsule Take 10 mg by mouth 4 (four) times daily -  before meals and at bedtime.   Yes Historical Provider, MD  feeding supplement, GLUCERNA SHAKE, (GLUCERNA SHAKE) LIQD Take 237 mLs by mouth 3 (three) times daily between  meals. Patient taking differently: Take 237 mLs by mouth daily.  07/06/14  Yes Thurnell Lose, MD  gabapentin (NEURONTIN) 100 MG capsule Take 300 mg by mouth at bedtime.    Yes Historical Provider, MD  glucagon (GLUCAGON EMERGENCY) 1 MG injection Inject 1 mg into the muscle once as needed (severe hypoglycemia). 08/14/14  Yes Arnoldo Morale, MD  insulin aspart (NOVOLOG) 100 UNIT/ML injection Inject 0-5 Units into the skin 3 (three) times daily with meals. 08/01/14  Yes Silver Huguenin Elgergawy, MD  insulin glargine (LANTUS) 100 UNIT/ML injection Inject 0.2 mLs (20 Units total) into the skin every morning. Patient taking differently: Inject 15 Units into the skin every morning.  08/07/14  Yes Arnoldo Morale, MD  loperamide (IMODIUM) 2 MG capsule Take 2 capsules (4 mg total) by mouth 4 (four) times daily -  before meals and at bedtime. 07/06/14  Yes Thurnell Lose, MD  Multiple Vitamin (MULTIVITAMIN WITH MINERALS) TABS Take 1 tablet by mouth every morning. Diabetic support vitamin   Yes Historical Provider, MD  ondansetron (ZOFRAN-ODT) 4 MG disintegrating tablet 4 mg every 8 hours as needed for nausea or vomiting. 07/06/14  Yes Historical Provider, MD  oxyCODONE-acetaminophen (PERCOCET) 5-325 MG per tablet Take 1 tablet by mouth every 4 (four) hours as needed for severe pain. 07/06/14  Yes Thurnell Lose, MD  promethazine (PHENERGAN) 25 MG suppository Place 1 suppository (25 mg total) rectally every 6 (six) hours as needed for nausea or vomiting. 08/19/14  Yes Nat Christen, MD  saccharomyces boulardii (FLORASTOR) 250 MG capsule Take 1 capsule (250 mg total) by mouth 2 (two) times daily. 07/06/14  Yes Thurnell Lose, MD  traZODone (DESYREL) 50 MG tablet Take 1 tablet (50 mg total) by mouth at bedtime as needed for sleep. 08/07/14  Yes Arnoldo Morale, MD  Acetaminophen-Codeine (TYLENOL/CODEINE #3) 300-30 MG per tablet Take 1 tablet by mouth every 6 (six) hours as needed for pain. Patient not taking: Reported on 08/14/2014  08/07/14   Arnoldo Morale, MD  amoxicillin (AMOXIL) 500 MG capsule Take 1 capsule (500 mg total) by mouth 3 (three) times daily. Patient not taking: Reported on 08/30/2014 08/19/14   Nat Christen, MD  diphenoxylate-atropine (LOMOTIL) 2.5-0.025 MG per tablet Take 2 tablets by mouth 2 (two) times daily. Patient not taking: Reported on 08/19/2014 07/06/14   Thurnell Lose, MD   BP 104/70 mmHg  Pulse 99  Temp(Src) 98.6 F (37 C) (Oral)  Resp 16  SpO2 100% Physical Exam  Constitutional: She is oriented to person, place, and time. She appears well-developed and well-nourished. No distress.  HENT:  Head: Normocephalic and atraumatic.  Mouth/Throat: Oropharynx is clear and moist.  Eyes: EOM are normal. Pupils are equal, round, and reactive to light.  Neck: Normal range of motion. Neck supple.  Cardiovascular: Normal rate and regular rhythm.  Exam reveals no friction rub.  No murmur heard. Pulmonary/Chest: Effort normal and breath sounds normal. No respiratory distress. She has no wheezes. She has no rales.  Abdominal: Soft. She exhibits no distension. There is tenderness (mild, lower). There is no rebound.  Musculoskeletal: Normal range of motion. She exhibits no edema.  Neurological: She is alert and oriented to person, place, and time.  Skin: She is not diaphoretic.  Nursing note and vitals reviewed.   ED Course  Procedures (including critical care time) Labs Review Labs Reviewed  BASIC METABOLIC PANEL - Abnormal; Notable for the following:    Sodium 132 (*)    Potassium 5.3 (*)    Chloride 96 (*)    Glucose, Bld 567 (*)    All other components within normal limits  URINALYSIS, ROUTINE W REFLEX MICROSCOPIC (NOT AT [facility]) - Abnormal; Notable for the following:    Specific Gravity, Urine 1.035 (*)    Glucose, UA >1000 (*)    All other components within normal limits  BLOOD GAS, VENOUS - Abnormal; Notable for the following:    pH, Ven 7.346 (*)    pCO2, Ven 51.0 (*)    Bicarbonate 27.2  (*)    All other components within normal limits  CBG MONITORING, ED - Abnormal; Notable for the following:    Glucose-Capillary 497 (*)    All other components within normal limits  CBG MONITORING, ED - Abnormal; Notable for the following:    Glucose-Capillary 383 (*)    All other components within normal limits  CBC  URINE MICROSCOPIC-ADD ON    Imaging Review No results found.   EKG Interpretation None      MDM   Final diagnoses:  Hyperglycemia  Pain, dental    34 year old female here with hyperglycemia. She is a brittle type I diabetic. She states she's been unable to be recently due to some dental pain. Here she is well-appearing but her sugars are fairly elevated. She has some mild lower abdominal tenderness, so we will check for DKA. She has no peritoneal signs. Urine is normal. No white count. Show bicarbonate is 26 and glucose is 567. No concern for DKA. 10 units of subcutaneous insulin given. After 2 L of fluid in the insulin her blood sugars in the 380s. Discussed with patient about her sugars and she feels comfortable going home at this level. She is feeling better after the fluid. Given pain medicine for her dental pain. Stable for discharge.    Evelina Bucy, MD 09/02/14 [phone]

## 2014-09-03 ENCOUNTER — Telehealth: Payer: Self-pay

## 2014-09-03 NOTE — Telephone Encounter (Signed)
Called the patient to check on her status and to confirm her appointment for tomorrow.  Voice mail message left requesting a call back to # [phone] or [phone].

## 2014-09-04 ENCOUNTER — Ambulatory Visit: Payer: Self-pay | Admitting: Family Medicine

## 2014-09-06 NOTE — ED Provider Notes (Signed)
CSN: [id number]     Arrival date & time 08/30/14  1345 History   First MD Initiated Contact with Patient 08/30/14 1426     Chief Complaint  Patient presents with  . Abdominal Pain  . Hyperglycemia     (Consider location/radiation/quality/duration/timing/severity/associated sxs/prior Treatment) Patient is a 34 y.o. female presenting with abdominal pain and hyperglycemia.  Abdominal Pain Pain location:  Generalized Pain quality: cramping   Pain radiates to:  Does not radiate Pain severity:  Severe Onset quality:  Gradual Duration:  2 days Timing:  Intermittent Progression:  Waxing and waning Chronicity:  Recurrent Context comment:  Chronic abd pain, DM with hyperglycemia Relieved by:  Nothing Worsened by:  Nothing tried Ineffective treatments:  None tried Associated symptoms: anorexia, nausea and vomiting   Associated symptoms: no dysuria   Hyperglycemia Associated symptoms: abdominal pain, nausea and vomiting   Associated symptoms: no dysuria     Past Medical History  Diagnosis Date  . DM type 1 causing complication dx 2244    hyperglycemia +/- DKA + coma, severe hypoglycemia, gastroparesis, peripheral neuropathy  . Thyroid disease     hypothyroidism associated with pregnancy  . Back pain   . PONV (postoperative nausea and vomiting)   . Anxiety     hx panic attacks.   Marland Kitchen GERD (gastroesophageal reflux disease)   . DM gastroparesis   . Anemia     .Autoimmune hemolytic anemia. bone marrow bx 2008, Dr Marin Olp  . Colitis, Clostridium difficile 05/2014  . Fatty liver 01/2007    with hepatomegaly on CT scan.    Past Surgical History  Procedure Laterality Date  . Laparoscopy  02/2002, 06/2010    laparoscopy with lysis pelvic adhesions for pelvic pain  . Back surgery      age 23  . Tooth extraction    . Root canal  10/10/12  . Flexible sigmoidoscopy N/A 06/24/2014    Procedure: FLEXIBLE SIGMOIDOSCOPY;  Surgeon: Milus Banister, MD;  Location: [facility];  Service:  Endoscopy;  Laterality: N/A;  . Esophagogastroduodenoscopy (egd) with propofol N/A 06/27/2014    Procedure: ESOPHAGOGASTRODUODENOSCOPY (EGD) WITH PROPOFOL;  Surgeon: Milus Banister, MD;  Location: [facility];  Service: Endoscopy;  Laterality: N/A;   Family History  Problem Relation Age of Onset  . Diabetes Maternal Grandmother   . Cancer Maternal Grandmother     Leukemia  . Cancer Maternal Grandfather     Small cell lung cancer   History  Substance Use Topics  . Smoking status: Former Smoker    Quit date: 04/19/2000  . Smokeless tobacco: Former Systems developer  . Alcohol Use: No     Comment: RARE SIPS OF WINE   OB History    No data available     Review of Systems  Gastrointestinal: Positive for nausea, vomiting, abdominal pain and anorexia.  Genitourinary: Negative for dysuria.  All other systems reviewed and are negative.     Allergies  Scallops; Morphine and related; Latex; and Other  Home Medications   Prior to Admission medications   Medication Sig Start Date End Date Taking? Authorizing Provider  acetaminophen (TYLENOL) 500 MG tablet Take 500 mg by mouth every 6 (six) hours as needed for moderate pain or headache.    Yes Historical Provider, MD  ALPRAZolam Duanne Moron) 1 MG tablet Take 1 mg by mouth at bedtime.    Yes Historical Provider, MD  amitriptyline (ELAVIL) 50 MG tablet Take 2 tablets (100 mg total) by mouth at bedtime. 08/07/14  Yes Enobong  Amao, MD  CALCIUM PO Take 2 tablets by mouth daily.   Yes Historical Provider, MD  CREON 24000 UNITS CPEP TAKE 29937 UNITS WITH EVERY MEAL AND 16967 UNITS WITH EVERY SNACK 08/09/14  Yes Historical Provider, MD  cyanocobalamin 500 MCG tablet Take 500 mcg by mouth 2 (two) times daily.   Yes Historical Provider, MD  dicyclomine (BENTYL) 10 MG capsule Take 10 mg by mouth 4 (four) times daily -  before meals and at bedtime.   Yes Historical Provider, MD  feeding supplement, GLUCERNA SHAKE, (GLUCERNA SHAKE) LIQD Take 237 mLs by mouth 3  (three) times daily between meals. Patient taking differently: Take 237 mLs by mouth daily.  07/06/14  Yes Thurnell Lose, MD  gabapentin (NEURONTIN) 100 MG capsule Take 300 mg by mouth at bedtime.    Yes Historical Provider, MD  glucagon (GLUCAGON EMERGENCY) 1 MG injection Inject 1 mg into the muscle once as needed (severe hypoglycemia). 08/14/14  Yes Arnoldo Morale, MD  insulin aspart (NOVOLOG) 100 UNIT/ML injection Inject 0-5 Units into the skin 3 (three) times daily with meals. 08/01/14  Yes Silver Huguenin Elgergawy, MD  insulin glargine (LANTUS) 100 UNIT/ML injection Inject 0.2 mLs (20 Units total) into the skin every morning. Patient taking differently: Inject 15 Units into the skin every morning.  08/07/14  Yes Arnoldo Morale, MD  loperamide (IMODIUM) 2 MG capsule Take 2 capsules (4 mg total) by mouth 4 (four) times daily -  before meals and at bedtime. 07/06/14  Yes Thurnell Lose, MD  Multiple Vitamin (MULTIVITAMIN WITH MINERALS) TABS Take 1 tablet by mouth every morning. Diabetic support vitamin   Yes Historical Provider, MD  ondansetron (ZOFRAN-ODT) 4 MG disintegrating tablet 4 mg every 8 hours as needed for nausea or vomiting. 07/06/14  Yes Historical Provider, MD  oxyCODONE-acetaminophen (PERCOCET) 5-325 MG per tablet Take 1 tablet by mouth every 4 (four) hours as needed for severe pain. 07/06/14  Yes Thurnell Lose, MD  promethazine (PHENERGAN) 25 MG suppository Place 1 suppository (25 mg total) rectally every 6 (six) hours as needed for nausea or vomiting. 08/19/14  Yes Nat Christen, MD  saccharomyces boulardii (FLORASTOR) 250 MG capsule Take 1 capsule (250 mg total) by mouth 2 (two) times daily. 07/06/14  Yes Thurnell Lose, MD  traZODone (DESYREL) 50 MG tablet Take 1 tablet (50 mg total) by mouth at bedtime as needed for sleep. 08/07/14  Yes Arnoldo Morale, MD  Acetaminophen-Codeine (TYLENOL/CODEINE #3) 300-30 MG per tablet Take 1 tablet by mouth every 6 (six) hours as needed for pain. Patient not  taking: Reported on 08/14/2014 08/07/14   Arnoldo Morale, MD  amoxicillin (AMOXIL) 500 MG capsule Take 1 capsule (500 mg total) by mouth 3 (three) times daily. Patient not taking: Reported on 08/30/2014 08/19/14   Nat Christen, MD  diphenoxylate-atropine (LOMOTIL) 2.5-0.025 MG per tablet Take 2 tablets by mouth 2 (two) times daily. Patient not taking: Reported on 08/19/2014 07/06/14   Thurnell Lose, MD   BP 99/66 mmHg  Pulse 75  Temp(Src) 98.7 F (37.1 C) (Oral)  Resp 16  SpO2 100% Physical Exam  Constitutional: She is oriented to person, place, and time. She appears well-developed and well-nourished.  HENT:  Head: Normocephalic and atraumatic.  Right Ear: External ear normal.  Left Ear: External ear normal.  Eyes: Conjunctivae and EOM are normal. Pupils are equal, round, and reactive to light.  Neck: Normal range of motion. Neck supple.  Cardiovascular: Normal rate, regular rhythm, normal heart  sounds and intact distal pulses.   Pulmonary/Chest: Effort normal and breath sounds normal.  Abdominal: Soft. Bowel sounds are normal. There is generalized tenderness (mild).  Musculoskeletal: Normal range of motion.  Neurological: She is alert and oriented to person, place, and time.  Skin: Skin is warm and dry.  Vitals reviewed.   ED Course  Procedures (including critical care time) Labs Review Labs Reviewed  COMPREHENSIVE METABOLIC PANEL - Abnormal; Notable for the following:    Sodium 130 (*)    Chloride 96 (*)    Glucose, Bld 471 (*)    Total Protein 8.3 (*)    All other components within normal limits  URINALYSIS, ROUTINE W REFLEX MICROSCOPIC (NOT AT [facility]) - Abnormal; Notable for the following:    Specific Gravity, Urine 1.040 (*)    Glucose, UA >1000 (*)    All other components within normal limits  URINE MICROSCOPIC-ADD ON - Abnormal; Notable for the following:    Squamous Epithelial / LPF FEW (*)    All other components within normal limits  CBG MONITORING, ED - Abnormal;  Notable for the following:    Glucose-Capillary 440 (*)    All other components within normal limits  CBG MONITORING, ED - Abnormal; Notable for the following:    Glucose-Capillary 409 (*)    All other components within normal limits  CBG MONITORING, ED - Abnormal; Notable for the following:    Glucose-Capillary 347 (*)    All other components within normal limits  CBC WITH DIFFERENTIAL/PLATELET  POC URINE PREG, ED  CBG MONITORING, ED    Imaging Review No results found.   EKG Interpretation None      MDM   Final diagnoses:  Hyperglycemia  Generalized abdominal pain    34 y.o. female with pertinent PMH of DM, chronic abd pain presents with acute on chronic abdominal pain as above. Exam reassuring, significant only for mild generalized abdominal tenderness. Patient also concerned of hyperglycemia, elevated on arrival. She was given fluids with improvement. Patient care to Dr. Betsey Holiday pending improvement in hyperglycemia.   I have reviewed all laboratory and imaging studies if ordered as above  1. Hyperglycemia   2. Generalized abdominal pain         Debby Freiberg, MD 09/06/14 [phone]

## 2014-09-09 ENCOUNTER — Telehealth: Payer: Self-pay

## 2014-09-09 NOTE — Telephone Encounter (Signed)
Called the patient to check on her status.. She said that she had an appointment w/ GI at [facility] last week and she understands that she needs to schedule a follow up appointment w/ [facility].  An appointment was scheduled for 09/12/14 @ 1145.  She said that she would be at the appointment and her mother would be able to accompany her.

## 2014-09-11 ENCOUNTER — Telehealth: Payer: Self-pay | Admitting: *Deleted

## 2014-09-11 NOTE — Telephone Encounter (Signed)
Verified patient name and date of birth and verified appointment at the clinic tomorrow at 1145.  Patient states she is doing well.

## 2014-09-12 ENCOUNTER — Ambulatory Visit: Payer: Medicaid Other | Attending: Family Medicine | Admitting: Family Medicine

## 2014-09-12 ENCOUNTER — Encounter: Payer: Self-pay | Admitting: Family Medicine

## 2014-09-12 VITALS — BP 95/65 | HR 94 | Temp 98.0°F | Ht 65.0 in | Wt 93.0 lb

## 2014-09-12 DIAGNOSIS — E1065 Type 1 diabetes mellitus with hyperglycemia: Secondary | ICD-10-CM | POA: Diagnosis not present

## 2014-09-12 LAB — GLUCOSE, POCT (MANUAL RESULT ENTRY): POC GLUCOSE: 240 mg/dL — AB (ref 70–99)

## 2014-09-12 MED ORDER — PROMETHAZINE HCL 25 MG RE SUPP: 25.0000 mg | Freq: Four times a day (QID) | RECTAL | Status: DC | PRN

## 2014-09-12 MED ORDER — TRAZODONE HCL 50 MG PO TABS: 50.0000 mg | ORAL_TABLET | Freq: Every evening | ORAL | Status: DC | PRN

## 2014-09-12 NOTE — Progress Notes (Signed)
Patient still reports diarrhea Has seen Gi at [facility] who will do complete endo next Wednesday Has appt with endocrinologist Still using 15 Lantus

## 2014-09-12 NOTE — Progress Notes (Signed)
Subjective:    Patient ID: [name], female    DOB: [birth year], 34 y.o.   MRN: [id number]  HPI [name] is a 34 year old female with a history of type 1 diabetes mellitus, gastroparesis secondary to diabetes, chronic diarrhea for the past 7 months, pancreatic insufficiency with multiple ED visits for severe abdominal pain , chronic diarrhea and hyperglycemia.  She has had extensive GI workup for chronic diarrhea .Patient was admitted in the beginning of May with abdominal pain and diarrhea. She underwent flexible sigmoidoscopy in 06/2014 showed no pseudo-membranous colitis and EGD revealed no anatomical outlet obstruction. Patient's medical course had been complicated by poor compliance.  She has been on Flagyl for treatment for C. Difficile, she had a negative celiac sprue workup.   She was seen by GI and workup revealed pancreatic insufficiency; also saw GI at [facility] and a repeat C. difficile culture came back negative and an EUS has been scheduled  She remains on Flagyl for visceral neuropathy versus bacteria overgrowth.  Due to hyperglycemia I had raised her Lantus from 15 to 20 units , however she continues to take 15 units because she says she has hypoglycemia with sugars in the 40s and 50swakes up in the middle of the night with tremors. She admits to snacking till late in the night. She has an upcoming appointment with her endocrinologist on 10/03/14   Past Medical History  Diagnosis Date  . DM type 1 causing complication dx 0932    hyperglycemia +/- DKA + coma, severe hypoglycemia, gastroparesis, peripheral neuropathy  . Thyroid disease     hypothyroidism associated with pregnancy  . Back pain   . PONV (postoperative nausea and vomiting)   . Anxiety     hx panic attacks.   Marland Kitchen GERD (gastroesophageal reflux disease)   . DM gastroparesis   . Anemia     .Autoimmune hemolytic anemia. bone marrow bx 2008, Dr Marin Olp  . Colitis, Clostridium difficile 05/2014  . Fatty liver  01/2007    with hepatomegaly on CT scan.     Past Surgical History  Procedure Laterality Date  . Laparoscopy  02/2002, 06/2010    laparoscopy with lysis pelvic adhesions for pelvic pain  . Back surgery      age 42  . Tooth extraction    . Root canal  10/10/12  . Flexible sigmoidoscopy N/A 06/24/2014    Procedure: FLEXIBLE SIGMOIDOSCOPY;  Surgeon: Milus Banister, MD;  Location: [facility];  Service: Endoscopy;  Laterality: N/A;  . Esophagogastroduodenoscopy (egd) with propofol N/A 06/27/2014    Procedure: ESOPHAGOGASTRODUODENOSCOPY (EGD) WITH PROPOFOL;  Surgeon: Milus Banister, MD;  Location: [facility];  Service: Endoscopy;  Laterality: N/A;    History   Social History  . Marital Status: Single    Spouse Name: N/A  . Number of Children: N/A  . Years of Education: N/A   Occupational History  . Not on file.   Social History Main Topics  . Smoking status: Former Smoker    Quit date: 04/19/2000  . Smokeless tobacco: Former Systems developer  . Alcohol Use: No     Comment: RARE SIPS OF WINE  . Drug Use: No  . Sexual Activity: No   Other Topics Concern  . Not on file   Social History Narrative   Single.  Lives with children.    Allergies  Allergen Reactions  . Scallops [Shellfish Allergy] Anaphylaxis  . Morphine And Related Itching  . Latex Rash  . Other  Rash    Sunscreen and grass    Current Outpatient Prescriptions on File Prior to Visit  Medication Sig Dispense Refill  . ALPRAZolam (XANAX) 1 MG tablet Take 1 mg by mouth at bedtime.     Marland Kitchen amitriptyline (ELAVIL) 50 MG tablet Take 2 tablets (100 mg total) by mouth at bedtime. 60 tablet 0  . CALCIUM PO Take 2 tablets by mouth daily.    Marland Kitchen CREON 24000 UNITS CPEP TAKE 96222 UNITS WITH EVERY MEAL AND 97989 UNITS WITH EVERY SNACK  1  . cyanocobalamin 500 MCG tablet Take 500 mcg by mouth 2 (two) times daily.    Marland Kitchen dicyclomine (BENTYL) 10 MG capsule Take 10 mg by mouth 4 (four) times daily -  before meals and at bedtime.    . feeding  supplement, GLUCERNA SHAKE, (GLUCERNA SHAKE) LIQD Take 237 mLs by mouth 3 (three) times daily between meals. (Patient taking differently: Take 237 mLs by mouth daily. ) 90 Can 0  . gabapentin (NEURONTIN) 100 MG capsule Take 300 mg by mouth at bedtime.     Marland Kitchen glucagon (GLUCAGON EMERGENCY) 1 MG injection Inject 1 mg into the muscle once as needed (severe hypoglycemia). 2 each prn  . insulin aspart (NOVOLOG) 100 UNIT/ML injection Inject 0-5 Units into the skin 3 (three) times daily with meals. 10 mL 11  . insulin glargine (LANTUS) 100 UNIT/ML injection Inject 0.2 mLs (20 Units total) into the skin every morning. (Patient taking differently: Inject 15 Units into the skin every morning. ) 10 mL 3  . loperamide (IMODIUM) 2 MG capsule Take 2 capsules (4 mg total) by mouth 4 (four) times daily -  before meals and at bedtime. 30 capsule 0  . Multiple Vitamin (MULTIVITAMIN WITH MINERALS) TABS Take 1 tablet by mouth every morning. Diabetic support vitamin    . ondansetron (ZOFRAN-ODT) 4 MG disintegrating tablet 4 mg every 8 hours as needed for nausea or vomiting.  0  . oxyCODONE-acetaminophen (PERCOCET) 5-325 MG per tablet Take 1 tablet by mouth every 4 (four) hours as needed for severe pain. 20 tablet 0  . saccharomyces boulardii (FLORASTOR) 250 MG capsule Take 1 capsule (250 mg total) by mouth 2 (two) times daily. 60 capsule 2  . acetaminophen (TYLENOL) 500 MG tablet Take 500 mg by mouth every 6 (six) hours as needed for moderate pain or headache.     . Acetaminophen-Codeine (TYLENOL/CODEINE #3) 300-30 MG per tablet Take 1 tablet by mouth every 6 (six) hours as needed for pain. (Patient not taking: Reported on 08/14/2014) 50 tablet 0  . amoxicillin (AMOXIL) 500 MG capsule Take 1 capsule (500 mg total) by mouth 3 (three) times daily. (Patient not taking: Reported on 08/30/2014) 30 capsule 0  . [DISCONTINUED] cholestyramine light (PREVALITE) 4 G packet Take 1 packet (4 g total) by mouth daily as needed (diarrhea).  (Patient not taking: Reported on 06/15/2014) 10 packet 0  . [DISCONTINUED] insulin lispro (HUMALOG) 100 UNIT/ML injection continuous.     No current facility-administered medications on file prior to visit.       Review of Systems  General: negative for fever, weight loss, appetite change Eyes: no visual symptoms. ENT: no ear symptoms, no sinus tenderness, no nasal congestion or sore throat. Neck: no pain  Respiratory: no wheezing, shortness of breath, cough Cardiovascular: no chest pain, no dyspnea on exertion, no pedal edema, no orthopnea. Gastrointestinal: see hpi Genito-Urinary: no urinary frequency, no dysuria, no polyuria. Hematologic: no bruising Endocrine: no cold or heat intolerance  Neurological: no headaches, no seizures, no tremors Musculoskeletal: no joint pains, no joint swelling Skin: no pruritus, no rash. Psychological: no depression, no anxiety,       Objective: Filed Vitals:   09/12/14 1200  BP: 95/65  Pulse: 94  Temp: 98 F (36.7 C)  Height: 5\' 5"  (1.651 m)  Weight: 93 lb (42.185 kg)  SpO2: 100%      Physical Exam  Constitutional: thin-appearing Eyes: PERRLA HEENT: Head is atraumatic, normal sinuses, normal oropharynx, normal appearing tonsils and palate, tympanic membrane is normal bilaterally. Neck: normal range of motion, no thyromegaly, no JVD Cardiovascular: normal rate and rhythm, normal heart sounds, no murmurs, rub or gallop, no pedal edema Respiratory: clear to auscultation bilaterally, no wheezes, no rales, no rhonchi Abdomen: soft, Tender to palpation, hyperactive bowel sounds. Extremities: Full ROM, no tenderness in joints Skin: warm and dry, no lesions. Neurological: alert, oriented x3, cranial nerves I-XII grossly intact , normal motor strength, normal sensation. Psychological: normal mood.         Assessment & Plan:  34 year old female with a history of type 1 diabetes mellitus, gastroparesis secondary to diabetes, chronic  diarrhea for the past 7 months, pancreatic insufficiency and chronic abdominal pain and diarrhea of unknown etiology work up so far unrevealing except for finding of pancreatic insufficiency.  Type 1 Dm with gastroparesis: Uncontrolled with A1c of 11,8 and CBG is 240; with extremely labile sugars. I would love for her to remain on Lantus 20 units but she states she feels comfortable on 15 units as she still has hypoglycemia. Advised to keep appointment with Endocrinology- Dr Cruzita Lederer scheduled for 10/03/14.  Abdominal pain and chronic diarrhea ?malabsorption, most recent C.diff (done at [facility]) from 7/13 was negative, HIV test negative  scheduled for EUS at [facility] and is currently under the care of GI Continue Bentyl, Imodium. Reviewed records from care everywhere.  Pancreatic insufficiency Continue Creon.   Insomnia: Controlled on trazodone  Restless legs: Continue amitriptyline which patient says works well.  Protein calorie malnutrition: This is secondary to chronic diarrhea.  This note has been created with Surveyor, quantity. Any transcriptional errors are unintentional.

## 2014-09-12 NOTE — Patient Instructions (Signed)

## 2014-09-13 NOTE — Progress Notes (Signed)
Quick Note:  Labs addressed at office as well as medications and patient was made aware. ______ 

## 2014-09-19 ENCOUNTER — Emergency Department (HOSPITAL_COMMUNITY): Payer: Medicaid Other

## 2014-09-19 ENCOUNTER — Emergency Department (HOSPITAL_COMMUNITY)
Admission: EM | Admit: 2014-09-19 | Discharge: 2014-09-19 | Disposition: A | Discharge status: Home / Self Care | Payer: Medicaid Other | Attending: Internal Medicine | Admitting: Internal Medicine

## 2014-09-19 ENCOUNTER — Encounter (HOSPITAL_COMMUNITY): Payer: Self-pay

## 2014-09-19 DIAGNOSIS — R1013 Epigastric pain: Secondary | ICD-10-CM | POA: Diagnosis not present

## 2014-09-19 DIAGNOSIS — E079 Disorder of thyroid, unspecified: Secondary | ICD-10-CM | POA: Insufficient documentation

## 2014-09-19 DIAGNOSIS — E1065 Type 1 diabetes mellitus with hyperglycemia: Secondary | ICD-10-CM | POA: Diagnosis present

## 2014-09-19 DIAGNOSIS — Z8659 Personal history of other mental and behavioral disorders: Secondary | ICD-10-CM | POA: Insufficient documentation

## 2014-09-19 DIAGNOSIS — Z79899 Other long term (current) drug therapy: Secondary | ICD-10-CM | POA: Diagnosis not present

## 2014-09-19 DIAGNOSIS — R112 Nausea with vomiting, unspecified: Secondary | ICD-10-CM | POA: Diagnosis not present

## 2014-09-19 DIAGNOSIS — Z862 Personal history of diseases of the blood and blood-forming organs and certain disorders involving the immune mechanism: Secondary | ICD-10-CM | POA: Diagnosis not present

## 2014-09-19 DIAGNOSIS — Z91040 Latex allergy status: Secondary | ICD-10-CM | POA: Diagnosis not present

## 2014-09-19 DIAGNOSIS — Z87891 Personal history of nicotine dependence: Secondary | ICD-10-CM | POA: Diagnosis not present

## 2014-09-19 DIAGNOSIS — R42 Dizziness and giddiness: Secondary | ICD-10-CM | POA: Insufficient documentation

## 2014-09-19 DIAGNOSIS — K219 Gastro-esophageal reflux disease without esophagitis: Secondary | ICD-10-CM | POA: Insufficient documentation

## 2014-09-19 DIAGNOSIS — R739 Hyperglycemia, unspecified: Secondary | ICD-10-CM

## 2014-09-19 DIAGNOSIS — R197 Diarrhea, unspecified: Secondary | ICD-10-CM

## 2014-09-19 DIAGNOSIS — Z8619 Personal history of other infectious and parasitic diseases: Secondary | ICD-10-CM | POA: Diagnosis not present

## 2014-09-19 DIAGNOSIS — Z3202 Encounter for pregnancy test, result negative: Secondary | ICD-10-CM | POA: Insufficient documentation

## 2014-09-19 DIAGNOSIS — R109 Unspecified abdominal pain: Secondary | ICD-10-CM

## 2014-09-19 DIAGNOSIS — Z794 Long term (current) use of insulin: Secondary | ICD-10-CM | POA: Diagnosis not present

## 2014-09-19 LAB — BLOOD GAS, VENOUS
Acid-Base Excess: 3.7 mmol/L — ABNORMAL HIGH (ref 0.0–2.0)
Bicarbonate: 28.9 mEq/L — ABNORMAL HIGH (ref 20.0–24.0)
FIO2: 0.21
O2 SAT: 76 %
PCO2 VEN: 48.6 mmHg (ref 45.0–50.0)
Patient temperature: 98.6
TCO2: 26.3 mmol/L (ref 0–100)
pH, Ven: 7.392 — ABNORMAL HIGH (ref 7.250–7.300)
pO2, Ven: 42.6 mmHg (ref 30.0–45.0)

## 2014-09-19 LAB — URINE MICROSCOPIC-ADD ON

## 2014-09-19 LAB — CBC WITH DIFFERENTIAL/PLATELET
BASOS PCT: 0 % (ref 0–1)
Basophils Absolute: 0 10*3/uL (ref 0.0–0.1)
EOS ABS: 0.1 10*3/uL (ref 0.0–0.7)
Eosinophils Relative: 1 % (ref 0–5)
HCT: 35.2 % — ABNORMAL LOW (ref 36.0–46.0)
Hemoglobin: 11.8 g/dL — ABNORMAL LOW (ref 12.0–15.0)
LYMPHS ABS: 1.6 10*3/uL (ref 0.7–4.0)
LYMPHS PCT: 27 % (ref 12–46)
MCH: 29.6 pg (ref 26.0–34.0)
MCHC: 33.5 g/dL (ref 30.0–36.0)
MCV: 88.2 fL (ref 78.0–100.0)
Monocytes Absolute: 0.4 10*3/uL (ref 0.1–1.0)
Monocytes Relative: 7 % (ref 3–12)
NEUTROS ABS: 3.9 10*3/uL (ref 1.7–7.7)
Neutrophils Relative %: 65 % (ref 43–77)
Platelets: 267 10*3/uL (ref 150–400)
RBC: 3.99 MIL/uL (ref 3.87–5.11)
RDW: 12.6 % (ref 11.5–15.5)
WBC: 6 10*3/uL (ref 4.0–10.5)

## 2014-09-19 LAB — COMPREHENSIVE METABOLIC PANEL
ALBUMIN: 4.3 g/dL (ref 3.5–5.0)
ALT: 24 U/L (ref 14–54)
AST: 25 U/L (ref 15–41)
Alkaline Phosphatase: 68 U/L (ref 38–126)
Anion gap: 12 (ref 5–15)
BUN: 14 mg/dL (ref 6–20)
CHLORIDE: 90 mmol/L — AB (ref 101–111)
CO2: 29 mmol/L (ref 22–32)
Calcium: 10 mg/dL (ref 8.9–10.3)
Creatinine, Ser: 0.93 mg/dL (ref 0.44–1.00)
GFR calc non Af Amer: >60 mL/min (ref >60)
GLUCOSE: 438 mg/dL — AB (ref 65–99)
Potassium: 4.6 mmol/L (ref 3.5–5.1)
Sodium: 131 mmol/L — ABNORMAL LOW (ref 135–145)
Total Bilirubin: 0.3 mg/dL (ref 0.3–1.2)
Total Protein: 7.8 g/dL (ref 6.5–8.1)

## 2014-09-19 LAB — URINALYSIS, ROUTINE W REFLEX MICROSCOPIC
BILIRUBIN URINE: NEGATIVE
Glucose, UA: >1000 mg/dL — AB
HGB URINE DIPSTICK: NEGATIVE
Ketones, ur: NEGATIVE mg/dL
Nitrite: NEGATIVE
Protein, ur: NEGATIVE mg/dL
Specific Gravity, Urine: >1.046 — ABNORMAL HIGH (ref 1.005–1.030)
UROBILINOGEN UA: 0.2 mg/dL (ref 0.0–1.0)
pH: 6 (ref 5.0–8.0)

## 2014-09-19 LAB — I-STAT BETA HCG BLOOD, ED (MC, WL, AP ONLY): I-stat hCG, quantitative: <5 m[IU]/mL (ref <5)

## 2014-09-19 LAB — CBG MONITORING, ED
Glucose-Capillary: 409 mg/dL — ABNORMAL HIGH (ref 65–99)
Glucose-Capillary: 242 mg/dL — ABNORMAL HIGH (ref 65–99)

## 2014-09-19 LAB — I-STAT CG4 LACTIC ACID, ED: LACTIC ACID, VENOUS: 3.94 mmol/L — AB (ref 0.5–2.0)

## 2014-09-19 LAB — LIPASE, BLOOD: Lipase: <10 U/L — ABNORMAL LOW (ref 22–51)

## 2014-09-19 MED ORDER — FENTANYL CITRATE (PF) 100 MCG/2ML IJ SOLN: 50.0000 ug | Freq: Once | INTRAMUSCULAR | Status: AC

## 2014-09-19 MED ORDER — IOHEXOL 300 MG/ML  SOLN
100.0000 mL | Freq: Once | INTRAMUSCULAR | Status: AC | PRN
  Administered 2014-09-19: 100 mL via INTRAVENOUS

## 2014-09-19 MED ORDER — LOPERAMIDE HCL 2 MG PO CAPS: 2.0000 mg | ORAL_CAPSULE | Freq: Four times a day (QID) | ORAL | Status: DC | PRN

## 2014-09-19 MED ORDER — IOHEXOL 300 MG/ML  SOLN
50.0000 mL | Freq: Once | INTRAMUSCULAR | Status: AC | PRN
  Administered 2014-09-19: 50 mL via ORAL

## 2014-09-19 MED ORDER — FENTANYL CITRATE (PF) 100 MCG/2ML IJ SOLN
50.0000 ug | Freq: Once | INTRAMUSCULAR | Status: AC
  Administered 2014-09-19: 50 ug via INTRAVENOUS
  Filled 2014-09-19: qty 2

## 2014-09-19 MED ORDER — FENTANYL CITRATE (PF) 100 MCG/2ML IJ SOLN
INTRAMUSCULAR | Status: AC
  Administered 2014-09-19: 50 ug
  Filled 2014-09-19: qty 2

## 2014-09-19 MED ORDER — SODIUM CHLORIDE 0.9 % IV BOLUS (SEPSIS)
1000.0000 mL | Freq: Once | INTRAVENOUS | Status: AC
  Administered 2014-09-19: 1000 mL via INTRAVENOUS

## 2014-09-19 MED ORDER — CHOLESTYRAMINE 4 G PO PACK: 4.0000 g | PACK | Freq: Two times a day (BID) | ORAL | Status: DC

## 2014-09-19 MED ORDER — PROMETHAZINE HCL 25 MG/ML IJ SOLN
25.0000 mg | Freq: Once | INTRAMUSCULAR | Status: AC
  Administered 2014-09-19: 25 mg via INTRAVENOUS
  Filled 2014-09-19: qty 1

## 2014-09-19 MED ORDER — HYDROMORPHONE HCL 1 MG/ML IJ SOLN: 1.0000 mg | Freq: Once | INTRAMUSCULAR | Status: DC

## 2014-09-19 NOTE — ED Provider Notes (Signed)
34 YOF signed out at shift change to me by Alvina Chou PA-C pending urine with request for hospital admission for increased abdominal pain after endoscopy, ongoing diarrhea, and elevated CBG. Pt was initially hypotensive which responded to IV therapy. CBG lowered with fluids. Pain reduced with pain medications. Dr. Charlies Silvers with hospital service consulted for admission for continued GI fluid loss with resulting hypotension and abdominal pain. She personally evaluated the patient in the ED and felt that this patient could be managed on an out patient basis. She advised to increase Imodium, with the addition of Questran. Follow-up with primary care provider for further evaluation and management. His vital signs remained stable, both her and her mother understood and agreed to today's plan and had no further questions or concerns at the time of discharge.  Okey Regal, PA-C 09/19/14 1908  Malvin Johns, MD 09/19/14 [phone]

## 2014-09-19 NOTE — ED Provider Notes (Addendum)
Complains of diffuse abdominal pain for the past several days. Pain is constant She presents today as pain became worse after having had upper endoscopy yesterday at [facility]. She vomited once today. She feels as if she may be going into DKA. She treated herself with Phenergan suppository, without relief. She continues to feel nauseated. No hematemesis. No fever. Associated symptoms include lightheadedness. She's also had diarrhea multiple episodes per day since January 2016. She's been treated for Clostridium difficile, reports subsequently been tested as negative. On exam patient is alert, nontoxic appearing. Vital signs noted. Respiratory rate counted is 20 by me. HEENT exam extremities dry. Neck supple. Lungs clear auscultation heart tachycardic regular rhythm abdomen nondistended, normoactive bowel sounds, mild diffuse tenderness. Extremities edema skin warm dry  Orlie Dakin, MD 09/19/14 1438  Orlie Dakin, MD 09/19/14 1646

## 2014-09-19 NOTE — ED Notes (Signed)
Pt's blood drawn but IV blew.  Informed Charge RN to place [place] IV.

## 2014-09-19 NOTE — ED Notes (Signed)
Pt c/o chronic generalized abdominal pain and diarrhea and emesis starting this morning.  Pain score 8/10.  Sts "this is how I feel when I start going into DKA."  Pt reports having upper endoscopy and abdominal ultrasound yesterday at [facility].  Sts she took a phenergan suppository w/o relief.

## 2014-09-19 NOTE — ED Notes (Signed)
Delay in lab draw...edp

## 2014-09-19 NOTE — ED Provider Notes (Signed)
CSN: [id number]     Arrival date & time 09/19/14  1355 History   First MD Initiated Contact with Patient 09/19/14 1415     Chief Complaint  Patient presents with  . Abdominal Pain  . Emesis  . Diarrhea  . Hyperglycemia     (Consider location/radiation/quality/duration/timing/severity/associated sxs/prior Treatment) HPI Comments: Patient is a 34 year old female with a past medical history of type 1 diabetes who presents with abdominal pain that started today. The pain is located in her epigastric area and does not radiate. The pain is described as aching and severe. The pain started gradually and progressively worsened since the onset. No alleviating/aggravating factors. The patient has tried phenergan suppository for symptoms without relief. Associated symptoms include nausea and vomiting and lightheadedness. She has diarrhea which has been persistent for the past 6 months. Patient denies fever, headache, chest pain, SOB, dysuria, constipation, abnormal vaginal bleeding/discharge. Patient reports having and upper endoscopy at [place] 2 days ago "to look at her pancreas."     Past Medical History  Diagnosis Date  . DM type 1 causing complication dx 9371    hyperglycemia +/- DKA + coma, severe hypoglycemia, gastroparesis, peripheral neuropathy  . Thyroid disease     hypothyroidism associated with pregnancy  . Back pain   . PONV (postoperative nausea and vomiting)   . Anxiety     hx panic attacks.   Marland Kitchen GERD (gastroesophageal reflux disease)   . DM gastroparesis   . Anemia     .Autoimmune hemolytic anemia. bone marrow bx 2008, Dr Marin Olp  . Colitis, Clostridium difficile 05/2014  . Fatty liver 01/2007    with hepatomegaly on CT scan.    Past Surgical History  Procedure Laterality Date  . Laparoscopy  02/2002, 06/2010    laparoscopy with lysis pelvic adhesions for pelvic pain  . Back surgery      age 3  . Tooth extraction    . Root canal  10/10/12  . Flexible sigmoidoscopy N/A  06/24/2014    Procedure: FLEXIBLE SIGMOIDOSCOPY;  Surgeon: Milus Banister, MD;  Location: [facility];  Service: Endoscopy;  Laterality: N/A;  . Esophagogastroduodenoscopy (egd) with propofol N/A 06/27/2014    Procedure: ESOPHAGOGASTRODUODENOSCOPY (EGD) WITH PROPOFOL;  Surgeon: Milus Banister, MD;  Location: [facility];  Service: Endoscopy;  Laterality: N/A;   Family History  Problem Relation Age of Onset  . Diabetes Maternal Grandmother   . Cancer Maternal Grandmother     Leukemia  . Cancer Maternal Grandfather     Small cell lung cancer   History  Substance Use Topics  . Smoking status: Former Smoker    Quit date: 04/19/2000  . Smokeless tobacco: Former Systems developer  . Alcohol Use: No     Comment: RARE SIPS OF WINE   OB History    No data available     Review of Systems  Gastrointestinal: Positive for nausea, vomiting and abdominal pain.  All other systems reviewed and are negative.     Allergies  Scallops; Morphine and related; Latex; and Other  Home Medications   Prior to Admission medications   Medication Sig Start Date End Date Taking? Authorizing Provider  acetaminophen (TYLENOL) 500 MG tablet Take 500 mg by mouth every 6 (six) hours as needed for moderate pain or headache.     Historical Provider, MD  Acetaminophen-Codeine (TYLENOL/CODEINE #3) 300-30 MG per tablet Take 1 tablet by mouth every 6 (six) hours as needed for pain. Patient not taking: Reported on 08/14/2014 08/07/14  Arnoldo Morale, MD  ALPRAZolam Duanne Moron) 1 MG tablet Take 1 mg by mouth at bedtime.     Historical Provider, MD  amitriptyline (ELAVIL) 50 MG tablet Take 2 tablets (100 mg total) by mouth at bedtime. 08/07/14   Arnoldo Morale, MD  amoxicillin (AMOXIL) 500 MG capsule Take 1 capsule (500 mg total) by mouth 3 (three) times daily. Patient not taking: Reported on 08/30/2014 08/19/14   Nat Christen, MD  CALCIUM PO Take 2 tablets by mouth daily.    Historical Provider, MD  CREON 24000 UNITS CPEP TAKE 16109 UNITS  WITH EVERY MEAL AND 60454 UNITS WITH EVERY SNACK 08/09/14   Historical Provider, MD  cyanocobalamin 500 MCG tablet Take 500 mcg by mouth 2 (two) times daily.    Historical Provider, MD  dicyclomine (BENTYL) 10 MG capsule Take 10 mg by mouth 4 (four) times daily -  before meals and at bedtime.    Historical Provider, MD  feeding supplement, GLUCERNA SHAKE, (GLUCERNA SHAKE) LIQD Take 237 mLs by mouth 3 (three) times daily between meals. Patient taking differently: Take 237 mLs by mouth daily.  07/06/14   Thurnell Lose, MD  gabapentin (NEURONTIN) 100 MG capsule Take 300 mg by mouth at bedtime.     Historical Provider, MD  glucagon (GLUCAGON EMERGENCY) 1 MG injection Inject 1 mg into the muscle once as needed (severe hypoglycemia). 08/14/14   Arnoldo Morale, MD  insulin aspart (NOVOLOG) 100 UNIT/ML injection Inject 0-5 Units into the skin 3 (three) times daily with meals. 08/01/14   Silver Huguenin Elgergawy, MD  insulin glargine (LANTUS) 100 UNIT/ML injection Inject 0.2 mLs (20 Units total) into the skin every morning. Patient taking differently: Inject 15 Units into the skin every morning.  08/07/14   Arnoldo Morale, MD  loperamide (IMODIUM) 2 MG capsule Take 2 capsules (4 mg total) by mouth 4 (four) times daily -  before meals and at bedtime. 07/06/14   Thurnell Lose, MD  Multiple Vitamin (MULTIVITAMIN WITH MINERALS) TABS Take 1 tablet by mouth every morning. Diabetic support vitamin    Historical Provider, MD  ondansetron (ZOFRAN-ODT) 4 MG disintegrating tablet 4 mg every 8 hours as needed for nausea or vomiting. 07/06/14   Historical Provider, MD  oxyCODONE-acetaminophen (PERCOCET) 5-325 MG per tablet Take 1 tablet by mouth every 4 (four) hours as needed for severe pain. 07/06/14   Thurnell Lose, MD  promethazine (PHENERGAN) 25 MG suppository Place 1 suppository (25 mg total) rectally every 6 (six) hours as needed for nausea or vomiting. 09/12/14   Arnoldo Morale, MD  saccharomyces boulardii (FLORASTOR) 250 MG  capsule Take 1 capsule (250 mg total) by mouth 2 (two) times daily. 07/06/14   Thurnell Lose, MD  traZODone (DESYREL) 50 MG tablet Take 1 tablet (50 mg total) by mouth at bedtime as needed for sleep. 09/12/14   Arnoldo Morale, MD   BP 80/52 mmHg  Pulse 119  Temp(Src) 98.3 F (36.8 C) (Oral)  SpO2 98% Physical Exam  Constitutional: She is oriented to person, place, and time. She appears well-developed and well-nourished. No distress.  HENT:  Head: Normocephalic and atraumatic.  Eyes: Conjunctivae and EOM are normal.  Neck: Normal range of motion.  Cardiovascular: Regular rhythm.  Exam reveals no gallop and no friction rub.   No murmur heard. tachycardic  Pulmonary/Chest: Effort normal and breath sounds normal. She has no wheezes. She has no rales. She exhibits no tenderness.  Abdominal: Soft. She exhibits no distension. There is tenderness. There  is no rebound.  Epigastric tenderness to palpation. No other focal tenderness. No peritoneal signs.   Musculoskeletal: Normal range of motion.  Neurological: She is alert and oriented to person, place, and time. Coordination normal.  Speech is goal-oriented. Moves limbs without ataxia.   Skin: Skin is warm and dry.  Psychiatric: She has a normal mood and affect. Her behavior is normal.  Nursing note and vitals reviewed.   ED Course  Procedures (including critical care time) Labs Review Labs Reviewed  CBC WITH DIFFERENTIAL/PLATELET - Abnormal; Notable for the following:    Hemoglobin 11.8 (*)    HCT 35.2 (*)    All other components within normal limits  COMPREHENSIVE METABOLIC PANEL - Abnormal; Notable for the following:    Sodium 131 (*)    Chloride 90 (*)    Glucose, Bld 438 (*)    All other components within normal limits  LIPASE, BLOOD - Abnormal; Notable for the following:    Lipase <10 (*)    All other components within normal limits  BLOOD GAS, VENOUS - Abnormal; Notable for the following:    pH, Ven 7.392 (*)    Bicarbonate  28.9 (*)    Acid-Base Excess 3.7 (*)    All other components within normal limits  CBG MONITORING, ED - Abnormal; Notable for the following:    Glucose-Capillary 409 (*)    All other components within normal limits  I-STAT CG4 LACTIC ACID, ED - Abnormal; Notable for the following:    Lactic Acid, Venous 3.94 (*)    All other components within normal limits  URINALYSIS, ROUTINE W REFLEX MICROSCOPIC (NOT AT [facility])  I-STAT BETA HCG BLOOD, ED ([facility], [facility], [facility] ONLY)    Imaging Review Ct Abdomen Pelvis W Contrast  09/19/2014   CLINICAL DATA:  Epigastric pain status post endoscopy yesterday, history of colitis and GERD  EXAM: CT ABDOMEN AND PELVIS WITH CONTRAST  TECHNIQUE: Multidetector CT imaging of the abdomen and pelvis was performed using the standard protocol following bolus administration of intravenous contrast.  CONTRAST:  175mL OMNIPAQUE IOHEXOL 300 MG/ML SOLN, 76mL OMNIPAQUE IOHEXOL 300 MG/ML SOLN  COMPARISON:  06/21/2014  FINDINGS: Lower chest:  Lung bases are clear.  Hepatobiliary: Liver is notable for focal fat/ altered perfusion along the falciform ligament (series 2/ image 25).  Gallbladder is unremarkable. No intrahepatic or extrahepatic ductal dilatation.  Pancreas: Within normal limits.  Spleen: Scattered calcified granulomata.  Adrenals/Urinary Tract: Adrenal glands are within normal limits.  Kidneys are within normal limits.  No hydronephrosis.  Mildly thick-walled bladder.  Stomach/Bowel: Stomach is moderately distended.  No evidence of bowel obstruction.  Normal appendix (coronal image 28).  Mildly residual irregular wall thickening involving the distal sigmoid colon/rectum (series 2/ image 61), without associated acute inflammatory changes.  Vascular/Lymphatic: No evidence of abdominal aortic aneurysm.  No suspicious abdominopelvic lymphadenopathy.  Reproductive: Uterus is unremarkable.  Bilateral ovaries are within normal limits.  Other: No abdominopelvic ascites.  Musculoskeletal:  Visualized osseous structures are within normal limits.  IMPRESSION: No evidence of bowel obstruction.  Normal appendix.  Mild residual irregular wall thickening on involving the distal sigmoid colon/rectum, without associated acute inflammatory changes.  Mildly thick-walled bladder, correlate for cystitis.   Electronically Signed   By: Julian Hy M.D.   On: 09/19/2014 16:13     EKG Interpretation None      MDM   Final diagnoses:  Abdominal pain  Hyperglycemia  Diarrhea    2:24 PM Labs and urinalysis pending. Patient is  tachycardic and hypotensive at thist ime.   Patient's anion gap 12. No DKA. She has a lactic acid of 3.94. Blood gas shows no acidosis. CT abdomen pelvis unremarkable for acute changes. Patient signs out to Lenn Sink, PA-C pending urinalysis. Likely to be admitted.   Alvina Chou, PA-C 09/20/14 2013  Orlie Dakin, MD 09/21/14 [phone]

## 2014-09-19 NOTE — ED Notes (Signed)
POC Lactic 3.94 PA Kaitlyn S notified

## 2014-09-19 NOTE — Discharge Instructions (Signed)
Abdominal Pain Many things can cause abdominal pain. Usually, abdominal pain is not caused by a disease and will improve without treatment. It can often be observed and treated at home. Your health care provider will do a physical exam and possibly order blood tests and X-rays to help determine the seriousness of your pain. However, in many cases, more time must pass before a clear cause of the pain can be found. Before that point, your health care provider may not know if you need more testing or further treatment. HOME CARE INSTRUCTIONS  Monitor your abdominal pain for any changes. The following actions may help to alleviate any discomfort you are experiencing:  Only take over-the-counter or prescription medicines as directed by your health care provider.  Do not take laxatives unless directed to do so by your health care provider.  Try a clear liquid diet (broth, tea, or water) as directed by your health care provider. Slowly move to a bland diet as tolerated. SEEK MEDICAL CARE IF:  You have unexplained abdominal pain.  You have abdominal pain associated with nausea or diarrhea.  You have pain when you urinate or have a bowel movement.  You experience abdominal pain that wakes you in the night.  You have abdominal pain that is worsened or improved by eating food.  You have abdominal pain that is worsened with eating fatty foods.  You have a fever. SEEK IMMEDIATE MEDICAL CARE IF:   Your pain does not go away within 2 hours.  You keep throwing up (vomiting).  Your pain is felt only in portions of the abdomen, such as the right side or the left lower portion of the abdomen.  You pass bloody or black tarry stools. MAKE SURE YOU:  Understand these instructions.   Will watch your condition.   Will get help right away if you are not doing well or get worse.  Document Released: 11/18/2004 Document Revised: 02/13/2013 Document Reviewed: 10/18/2012 [facility] Patient Information  2015 [place], [place]. This information is not intended to replace advice given to you by your health care provider. Make sure you discuss any questions you have with your health care provider.  Please use medication as directed, please follow-up with your primary care provider for further evaluation and management. Please monitor for new or worsening signs or symptoms, return as needed.

## 2014-09-20 ENCOUNTER — Telehealth: Payer: Self-pay

## 2014-09-20 NOTE — Telephone Encounter (Signed)
Attempted to contact the patient to check on her status as she was in the ED yesterday and to inquire if she would like to schedule a follow up appointment at the [facility].  Called # [phone] (M) and left a voice mail message requesting a call back to # [phone] or [phone].

## 2014-09-28 ENCOUNTER — Encounter (HOSPITAL_COMMUNITY): Payer: Self-pay | Admitting: Emergency Medicine

## 2014-09-28 ENCOUNTER — Emergency Department (HOSPITAL_COMMUNITY)
Admission: EM | Admit: 2014-09-28 | Discharge: 2014-09-28 | Disposition: A | Discharge status: Home / Self Care | Payer: Medicaid Other | Attending: Emergency Medicine | Admitting: Emergency Medicine

## 2014-09-28 DIAGNOSIS — F419 Anxiety disorder, unspecified: Secondary | ICD-10-CM | POA: Diagnosis not present

## 2014-09-28 DIAGNOSIS — R1084 Generalized abdominal pain: Secondary | ICD-10-CM | POA: Insufficient documentation

## 2014-09-28 DIAGNOSIS — R112 Nausea with vomiting, unspecified: Secondary | ICD-10-CM | POA: Diagnosis not present

## 2014-09-28 DIAGNOSIS — R197 Diarrhea, unspecified: Secondary | ICD-10-CM | POA: Insufficient documentation

## 2014-09-28 DIAGNOSIS — E1065 Type 1 diabetes mellitus with hyperglycemia: Secondary | ICD-10-CM | POA: Insufficient documentation

## 2014-09-28 DIAGNOSIS — Z79899 Other long term (current) drug therapy: Secondary | ICD-10-CM | POA: Insufficient documentation

## 2014-09-28 DIAGNOSIS — K219 Gastro-esophageal reflux disease without esophagitis: Secondary | ICD-10-CM | POA: Diagnosis not present

## 2014-09-28 DIAGNOSIS — Z3202 Encounter for pregnancy test, result negative: Secondary | ICD-10-CM | POA: Insufficient documentation

## 2014-09-28 DIAGNOSIS — Z794 Long term (current) use of insulin: Secondary | ICD-10-CM | POA: Diagnosis not present

## 2014-09-28 DIAGNOSIS — Z91040 Latex allergy status: Secondary | ICD-10-CM | POA: Insufficient documentation

## 2014-09-28 DIAGNOSIS — Z862 Personal history of diseases of the blood and blood-forming organs and certain disorders involving the immune mechanism: Secondary | ICD-10-CM | POA: Diagnosis not present

## 2014-09-28 DIAGNOSIS — Z87891 Personal history of nicotine dependence: Secondary | ICD-10-CM | POA: Insufficient documentation

## 2014-09-28 DIAGNOSIS — R739 Hyperglycemia, unspecified: Secondary | ICD-10-CM

## 2014-09-28 LAB — CBC
HCT: 35.2 % — ABNORMAL LOW (ref 36.0–46.0)
Hemoglobin: 11.7 g/dL — ABNORMAL LOW (ref 12.0–15.0)
MCH: 29.8 pg (ref 26.0–34.0)
MCHC: 33.2 g/dL (ref 30.0–36.0)
MCV: 89.6 fL (ref 78.0–100.0)
PLATELETS: 238 10*3/uL (ref 150–400)
RBC: 3.93 MIL/uL (ref 3.87–5.11)
RDW: 12.6 % (ref 11.5–15.5)
WBC: 5.8 10*3/uL (ref 4.0–10.5)

## 2014-09-28 LAB — BLOOD GAS, VENOUS
Acid-Base Excess: 3.3 mmol/L — ABNORMAL HIGH (ref 0.0–2.0)
Bicarbonate: 28.5 mEq/L — ABNORMAL HIGH (ref 20.0–24.0)
FIO2: 0.21
O2 SAT: 33.4 %
Patient temperature: 98.6
TCO2: 26.4 mmol/L (ref 0–100)
pCO2, Ven: 48.6 mmHg (ref 45.0–50.0)
pH, Ven: 7.387 — ABNORMAL HIGH (ref 7.250–7.300)

## 2014-09-28 LAB — URINALYSIS, ROUTINE W REFLEX MICROSCOPIC
Bilirubin Urine: NEGATIVE
Glucose, UA: >1000 mg/dL — AB
HGB URINE DIPSTICK: NEGATIVE
Ketones, ur: NEGATIVE mg/dL
NITRITE: NEGATIVE
PROTEIN: NEGATIVE mg/dL
SPECIFIC GRAVITY, URINE: 1.029 (ref 1.005–1.030)
UROBILINOGEN UA: 0.2 mg/dL (ref 0.0–1.0)
pH: 6 (ref 5.0–8.0)

## 2014-09-28 LAB — CBG MONITORING, ED
GLUCOSE-CAPILLARY: 289 mg/dL — AB (ref 65–99)
Glucose-Capillary: 394 mg/dL — ABNORMAL HIGH (ref 65–99)
Glucose-Capillary: 358 mg/dL — ABNORMAL HIGH (ref 65–99)

## 2014-09-28 LAB — COMPREHENSIVE METABOLIC PANEL
ALT: 23 U/L (ref 14–54)
ANION GAP: 7 (ref 5–15)
AST: 20 U/L (ref 15–41)
Albumin: 4.2 g/dL (ref 3.5–5.0)
Alkaline Phosphatase: 62 U/L (ref 38–126)
BUN: 16 mg/dL (ref 6–20)
CALCIUM: 9.2 mg/dL (ref 8.9–10.3)
CHLORIDE: 97 mmol/L — AB (ref 101–111)
CO2: 27 mmol/L (ref 22–32)
CREATININE: 0.77 mg/dL (ref 0.44–1.00)
GFR calc Af Amer: >60 mL/min (ref >60)
GFR calc non Af Amer: >60 mL/min (ref >60)
Glucose, Bld: 466 mg/dL — ABNORMAL HIGH (ref 65–99)
POTASSIUM: 4.7 mmol/L (ref 3.5–5.1)
SODIUM: 131 mmol/L — AB (ref 135–145)
Total Bilirubin: 0.4 mg/dL (ref 0.3–1.2)
Total Protein: 7.3 g/dL (ref 6.5–8.1)

## 2014-09-28 LAB — URINE MICROSCOPIC-ADD ON

## 2014-09-28 LAB — LIPASE, BLOOD: Lipase: 10 U/L — ABNORMAL LOW (ref 22–51)

## 2014-09-28 LAB — POC URINE PREG, ED: Preg Test, Ur: NEGATIVE

## 2014-09-28 MED ORDER — HYDROMORPHONE HCL 1 MG/ML IJ SOLN
0.5000 mg | Freq: Once | INTRAMUSCULAR | Status: AC
  Administered 2014-09-28: 0.5 mg via INTRAVENOUS
  Filled 2014-09-28: qty 1

## 2014-09-28 MED ORDER — PROMETHAZINE HCL 25 MG PO TABS: 25.0000 mg | ORAL_TABLET | Freq: Four times a day (QID) | ORAL | Status: DC | PRN

## 2014-09-28 MED ORDER — HALOPERIDOL LACTATE 5 MG/ML IJ SOLN
5.0000 mg | Freq: Once | INTRAMUSCULAR | Status: AC
  Administered 2014-09-28: 5 mg via INTRAVENOUS
  Filled 2014-09-28: qty 1

## 2014-09-28 MED ORDER — SODIUM CHLORIDE 0.9 % IV BOLUS (SEPSIS)
1000.0000 mL | Freq: Once | INTRAVENOUS | Status: AC
  Administered 2014-09-28: 1000 mL via INTRAVENOUS

## 2014-09-28 MED ORDER — INSULIN ASPART 100 UNIT/ML ~~LOC~~ SOLN
8.0000 [IU] | Freq: Once | SUBCUTANEOUS | Status: DC
  Filled 2014-09-28: qty 1

## 2014-09-28 MED ORDER — PROMETHAZINE HCL 25 MG/ML IJ SOLN
12.5000 mg | Freq: Four times a day (QID) | INTRAMUSCULAR | Status: DC | PRN
  Administered 2014-09-28: 12.5 mg via INTRAVENOUS
  Filled 2014-09-28: qty 1

## 2014-09-28 MED ORDER — CEPHALEXIN 250 MG PO CAPS: 250.0000 mg | ORAL_CAPSULE | Freq: Four times a day (QID) | ORAL | Status: DC

## 2014-09-28 MED ORDER — CEFTRIAXONE SODIUM 1 G IJ SOLR
1.0000 g | Freq: Once | INTRAMUSCULAR | Status: AC
  Administered 2014-09-28: 1 g via INTRAVENOUS
  Filled 2014-09-28: qty 10

## 2014-09-28 NOTE — ED Provider Notes (Signed)
CSN: [id number]     Arrival date & time 09/28/14  1004 History   First MD Initiated Contact with Patient 09/28/14 1021     Chief Complaint  Patient presents with  . Abdominal Pain    chronic, worse xfew days  . Emesis    xfew days  . Diarrhea    xfew days  . Hyperglycemia    xfew days "too high to read", "feels like DKA"     (Consider location/radiation/quality/duration/timing/severity/associated sxs/prior Treatment) HPI [name] is a 34 y.o. female with poorly controlled type 1 diabetes, thyroid disease, gastroparesis, anemia, colitis, presents to emergency department complaining of nausea, vomiting, diarrhea, abdominal pain. Patient states that her symptoms started yesterday. She reports diffuse abdominal pain, numerous episodes of nausea and vomiting. She states she thinks that she can keep some liquids down but states she is unable to keep any solids down. She states that she has chronic diarrhea, states she had C. difficile almost a year ago, since then persistent diarrhea even know her C. difficile cultures have been coming back negative. States her diarrhea is not any worse than it normally is. States she has some dysuria, denies urinary urgency or frequency. She denies any back pain. She denies any fever or chills. She denies any recent upper respiratory tract infection.  Past Medical History  Diagnosis Date  . DM type 1 causing complication dx 8416    hyperglycemia +/- DKA + coma, severe hypoglycemia, gastroparesis, peripheral neuropathy  . Thyroid disease     hypothyroidism associated with pregnancy  . Back pain   . PONV (postoperative nausea and vomiting)   . Anxiety     hx panic attacks.   Marland Kitchen GERD (gastroesophageal reflux disease)   . DM gastroparesis   . Anemia     .Autoimmune hemolytic anemia. bone marrow bx 2008, Dr Marin Olp  . Colitis, Clostridium difficile 05/2014  . Fatty liver 01/2007    with hepatomegaly on CT scan.    Past Surgical History  Procedure  Laterality Date  . Laparoscopy  02/2002, 06/2010    laparoscopy with lysis pelvic adhesions for pelvic pain  . Back surgery      age 64  . Tooth extraction    . Root canal  10/10/12  . Flexible sigmoidoscopy N/A 06/24/2014    Procedure: FLEXIBLE SIGMOIDOSCOPY;  Surgeon: Milus Banister, MD;  Location: [facility]COPY;  Service: Endoscopy;  Laterality: N/A;  . Esophagogastroduodenoscopy (egd) with propofol N/A 06/27/2014    Procedure: ESOPHAGOGASTRODUODENOSCOPY (EGD) WITH PROPOFOL;  Surgeon: Milus Banister, MD;  Location: [facility] ENDOS[facility];  Service: Endoscopy;  Laterality: N/A;   Family History  Problem Relation Age of Onset  . Diabetes Maternal Grandmother   . Cancer Maternal Grandmother     Leukemia  . Cancer Maternal Grandfather     Small cell lung cancer   History  Substance Use Topics  . Smoking status: Former Smoker    Quit date: 04/19/2000  . Smokeless tobacco: Former Systems developer  . Alcohol Use: No     Comment: RARE SIPS OF WINE   OB History    No data available     Review of Systems  Constitutional: Negative for fever and chills.  Respiratory: Negative for cough, chest tightness and shortness of breath.   Cardiovascular: Negative for chest pain, palpitations and leg swelling.  Gastrointestinal: Positive for nausea, vomiting, abdominal pain and diarrhea.  Genitourinary: Negative for dysuria, flank pain, vaginal bleeding, vaginal discharge, vaginal pain and pelvic pain.  Musculoskeletal:  Negative for myalgias, arthralgias, neck pain and neck stiffness.  Skin: Negative for rash.  Neurological: Positive for dizziness and weakness. Negative for headaches.  All other systems reviewed and are negative.     Allergies  Scallops; Morphine and related; Latex; and Other  Home Medications   Prior to Admission medications   Medication Sig Start Date End Date Taking? Authorizing Provider  acetaminophen (TYLENOL) 500 MG tablet Take 1,000 mg by mouth every 6 (six) hours as needed for moderate  pain.     Historical Provider, MD  Acetaminophen-Codeine (TYLENOL/CODEINE #3) 300-30 MG per tablet Take 1 tablet by mouth every 6 (six) hours as needed for pain. Patient not taking: Reported on 09/19/2014 08/07/14   Arnoldo Morale, MD  ALPRAZolam Duanne Moron) 1 MG tablet Take 1 mg by mouth at bedtime as needed for anxiety.     Historical Provider, MD  amitriptyline (ELAVIL) 50 MG tablet Take 2 tablets (100 mg total) by mouth at bedtime. 08/07/14   Arnoldo Morale, MD  amoxicillin (AMOXIL) 500 MG capsule Take 1 capsule (500 mg total) by mouth 3 (three) times daily. Patient not taking: Reported on 08/30/2014 08/19/14   Nat Christen, MD  CALCIUM PO Take 2 tablets by mouth daily.    Historical Provider, MD  cholestyramine Lucrezia Starch) 4 G packet Take 1 packet (4 g total) by mouth 2 (two) times daily. 09/19/14   Jeffrey Hedges, PA-C  CREON 24000 UNITS CPEP TAKE 48270 UNITS WITH EVERY MEAL AND 78675 UNITS WITH EVERY SNACK 08/09/14   Historical Provider, MD  cyanocobalamin 500 MCG tablet Take 500 mcg by mouth 2 (two) times daily.    Historical Provider, MD  cyclobenzaprine (FLEXERIL) 10 MG tablet Take 10 mg by mouth 3 (three) times daily as needed (mouth pain).    Historical Provider, MD  dicyclomine (BENTYL) 10 MG capsule Take 10 mg by mouth 4 (four) times daily -  before meals and at bedtime.    Historical Provider, MD  feeding supplement, GLUCERNA SHAKE, (GLUCERNA SHAKE) LIQD Take 237 mLs by mouth 3 (three) times daily between meals. Patient taking differently: Take 237 mLs by mouth daily.  07/06/14   Thurnell Lose, MD  gabapentin (NEURONTIN) 100 MG capsule Take 300 mg by mouth at bedtime.     Historical Provider, MD  glucagon (GLUCAGON EMERGENCY) 1 MG injection Inject 1 mg into the muscle once as needed (severe hypoglycemia). 08/14/14   Arnoldo Morale, MD  insulin aspart (NOVOLOG) 100 UNIT/ML injection Inject 0-5 Units into the skin 3 (three) times daily with meals. 08/01/14   Silver Huguenin Elgergawy, MD  insulin glargine (LANTUS)  100 UNIT/ML injection Inject 0.2 mLs (20 Units total) into the skin every morning. Patient taking differently: Inject 15 Units into the skin every morning.  08/07/14   Arnoldo Morale, MD  loperamide (IMODIUM) 2 MG capsule Take 1 capsule (2 mg total) by mouth 4 (four) times daily as needed for diarrhea or loose stools. 09/19/14   Okey Regal, PA-C  Multiple Vitamin (MULTIVITAMIN WITH MINERALS) TABS Take 1 tablet by mouth every morning. Diabetic support vitamin    Historical Provider, MD  ondansetron (ZOFRAN-ODT) 4 MG disintegrating tablet 4 mg every 8 hours as needed for nausea or vomiting. 07/06/14   Historical Provider, MD  oxyCODONE-acetaminophen (PERCOCET) 5-325 MG per tablet Take 1 tablet by mouth every 4 (four) hours as needed for severe pain. 07/06/14   Thurnell Lose, MD  promethazine (PHENERGAN) 25 MG suppository Place 1 suppository (25 mg total) rectally every  6 (six) hours as needed for nausea or vomiting. 09/12/14   Arnoldo Morale, MD  saccharomyces boulardii (FLORASTOR) 250 MG capsule Take 1 capsule (250 mg total) by mouth 2 (two) times daily. 07/06/14   Thurnell Lose, MD  traZODone (DESYREL) 50 MG tablet Take 1 tablet (50 mg total) by mouth at bedtime as needed for sleep. Patient taking differently: Take 50 mg by mouth at bedtime.  09/12/14   Arnoldo Morale, MD   BP 110/67 mmHg  Pulse 107  Temp(Src) 98.1 F (36.7 C) (Oral)  Resp 18  Ht 5\' 5"  (1.651 m)  Wt 94 lb (42.638 kg)  BMI 15.64 kg/m2  SpO2 100%  LMP 02/18/2014 Physical Exam  Constitutional: She appears well-developed and well-nourished. No distress.  HENT:  Head: Normocephalic.  Oral mucosa dry  Eyes: Conjunctivae are normal.  Neck: Neck supple.  Cardiovascular: Normal rate, regular rhythm and normal heart sounds.   Pulmonary/Chest: Effort normal and breath sounds normal. No respiratory distress. She has no wheezes. She has no rales.  Abdominal: Soft. Bowel sounds are normal. She exhibits no distension. There is  tenderness. There is no rebound.  Diffuse tenderness, no guarding. No CVA tenderness bilaterally.  Musculoskeletal: She exhibits no edema.  Neurological: She is alert.  Skin: Skin is warm and dry.  Psychiatric: She has a normal mood and affect. Her behavior is normal.  Nursing note and vitals reviewed.   ED Course  Procedures (including critical care time) Labs Review Labs Reviewed  LIPASE, BLOOD - Abnormal; Notable for the following:    Lipase 10 (*)    All other components within normal limits  COMPREHENSIVE METABOLIC PANEL - Abnormal; Notable for the following:    Sodium 131 (*)    Chloride 97 (*)    Glucose, Bld 466 (*)    All other components within normal limits  CBC - Abnormal; Notable for the following:    Hemoglobin 11.7 (*)    HCT 35.2 (*)    All other components within normal limits  URINALYSIS, ROUTINE W REFLEX MICROSCOPIC (NOT AT [facility]) - Abnormal; Notable for the following:    APPearance CLOUDY (*)    Glucose, UA >1000 (*)    Leukocytes, UA SMALL (*)    All other components within normal limits  BLOOD GAS, VENOUS - Abnormal; Notable for the following:    pH, Ven 7.387 (*)    Bicarbonate 28.5 (*)    Acid-Base Excess 3.3 (*)    All other components within normal limits  URINE MICROSCOPIC-ADD ON - Abnormal; Notable for the following:    Squamous Epithelial / LPF FEW (*)    Bacteria, UA MANY (*)    All other components within normal limits  CBG MONITORING, ED - Abnormal; Notable for the following:    Glucose-Capillary 394 (*)    All other components within normal limits  CBG MONITORING, ED - Abnormal; Notable for the following:    Glucose-Capillary 358 (*)    All other components within normal limits  CBG MONITORING, ED - Abnormal; Notable for the following:    Glucose-Capillary 289 (*)    All other components within normal limits  URINE CULTURE  POC URINE PREG, ED    Imaging Review No results found.   EKG Interpretation None      MDM   Final  diagnoses:  Hyperglycemia  Non-intractable vomiting with nausea, vomiting of unspecified type    patient with history of DKA, type 1 diabetes, gastroparesis, presents to emergency department complaining  of abdominal pain, nausea, vomiting, persistent diarrhea. Patient CBG is 394. She states she took all of her insulin as she is prescribed. States she hasn't been able to keep anything down since yesterday. Patient slightly tachycardic, abdomen diffusely tender with no guarding. Doubt acute abdomen. We'll start IV fluids, Phenergan for nausea, will monitor. Labs are pending.  1:20 PM Pt refused insulin. Continues to have pain and nausea. Will try haldol. Patient has UTI, Rocephin ordered. No evidence of DKA. PH 7.38, anion gap 7  1:59 PM Pt feels better but still requesting more phenergan. Will try haldol for nausea. Pain improved. Waiting for fluids to go in.   Pt feels better after haldol. Tolerating POs. cbg 289. Stable for dc home. Will prescribe phenergan and keflex for uti.   Filed Vitals:   09/28/14 1020 09/28/14 1247 09/28/14 1447 09/28/14 1515  BP:  102/67 89/59 92/59   Pulse:  93 80 84  Temp:    98.6 F (37 C)  TempSrc:      Resp:  16 16 16   Height: 5\' 5"  (1.651 m)     Weight: 94 lb (42.638 kg)     SpO2:  100% 100% 95%     Jeannett Senior, PA-C 09/28/14 1610  Leo Grosser, MD 09/28/14 [phone]

## 2014-09-28 NOTE — Discharge Instructions (Signed)
Take Phenergan as prescribed as needed for nausea vomiting. Make sure to continue to drink plenty of fluids to prevent dehydration. Continue your regular medications. Follow-up as needed.   Gastroparesis  Gastroparesis is also called slowed stomach emptying (delayed gastric emptying). It is a condition in which the stomach takes too long to empty its contents. It often happens in people with diabetes.  CAUSES  Gastroparesis happens when nerves to the stomach are damaged or stop working. When the nerves are damaged, the muscles of the stomach and intestines do not work normally. The movement of food is slowed or stopped. High blood glucose (sugar) causes changes in nerves and can damage the blood vessels that carry oxygen and nutrients to the nerves. RISK FACTORS  Diabetes.  Post-viral syndromes.  Eating disorders (anorexia, bulimia).  Surgery on the stomach or vagus nerve.  Gastroesophageal reflux disease (rarely).  Smooth muscle disorders (amyloidosis, scleroderma).  Metabolic disorders, including hypothyroidism.  Parkinson disease. SYMPTOMS   Heartburn.  Feeling sick to your stomach (nausea).  Vomiting of undigested food.  An early feeling of fullness when eating.  Weight loss.  Abdominal bloating.  Erratic blood glucose levels.  Lack of appetite.  Gastroesophageal reflux.  Spasms of the stomach wall. Complications can include:  Bacterial overgrowth in stomach. Food stays in the stomach and can ferment and cause bacteria to grow.  Weight loss due to difficulty digesting and absorbing nutrients.  Vomiting.  Obstruction in the stomach. Undigested food can harden and cause nausea and vomiting.  Blood glucose fluctuations caused by inconsistent food absorption. DIAGNOSIS  The diagnosis of gastroparesis is confirmed through one or more of the following tests:  Barium X-rays and scans. These tests look at how long it takes for food to move through the  stomach.  Gastric manometry. This test measures electrical and muscular activity in the stomach. A thin tube is passed down the throat into the stomach. The tube contains a wire that takes measurements of the stomach's electrical and muscular activity as it digests liquids and solid food.  Endoscopy. This procedure is done with a long, thin tube called an endoscope. It is passed through the mouth and gently down the esophagus into the stomach. This tube helps the caregiver look at the lining of the stomach to check for any abnormalities.  Ultrasonography. This can rule out gallbladder disease or pancreatitis. This test will outline and define the shape of the gallbladder and pancreas. TREATMENT   Treatments may include:  Exercise.  Medicines to control nausea and vomiting.  Medicines to stimulate stomach muscles.  Changes in what and when you eat.  Having smaller meals more often.  Eating low-fiber forms of high-fiber foods, such as eating cooked vegetables instead of raw vegetables.  Eating low-fat foods.  Consuming liquids, which are easier to digest.  In severe cases, feeding tubes and intravenous (IV) feeding may be needed. It is important to note that in most cases, treatment does not cure gastroparesis. It is usually a lasting (chronic) condition. Treatment helps you manage the underlying condition so that you can be as healthy and comfortable as possible. Other treatments  A gastric neurostimulator has been developed to assist people with gastroparesis. The battery-operated device is surgically implanted. It emits mild electrical pulses to help improve stomach emptying and to control nausea and vomiting.  The use of botulinum toxin has been shown to improve stomach emptying by decreasing the prolonged contractions of the muscle between the stomach and the small intestine (pyloric sphincter).  The benefits are temporary. SEEK MEDICAL CARE IF:   You have diabetes and you are  having problems keeping your blood glucose in goal range.  You are having nausea, vomiting, bloating, or early feelings of fullness with eating.  Your symptoms do not change with a change in diet. Document Released: 02/08/2005 Document Revised: 06/05/2012 Document Reviewed: 07/18/2008 [facility] Patient Information 2015 [place], [place]. This information is not intended to replace advice given to you by your health care provider. Make sure you discuss any questions you have with your health care provider.

## 2014-09-28 NOTE — ED Notes (Signed)
Patients family member given orange juice and crackers.

## 2014-09-28 NOTE — ED Notes (Signed)
Pt A+ox4, reports diffuse abd pain, n/v/d and hyperglycemia xdays.  Pt reports she is a brittle diabetic "always have problems with my sugar".  No active vomiting.  Skin PWD.  MAEI, ambulatory with steady gait.  Speaking full/clear sentences, rr even/un-lab.  NAD.

## 2014-09-28 NOTE — ED Notes (Signed)
RN Ajsa starting IV and getting labs

## 2014-10-01 LAB — URINE CULTURE: Culture: >=100000

## 2014-10-02 ENCOUNTER — Encounter (HOSPITAL_COMMUNITY): Payer: Self-pay | Admitting: Emergency Medicine

## 2014-10-02 ENCOUNTER — Emergency Department (HOSPITAL_COMMUNITY)
Admission: EM | Admit: 2014-10-02 | Discharge: 2014-10-02 | Disposition: A | Discharge status: Home / Self Care | Payer: Medicaid Other | Attending: Emergency Medicine | Admitting: Emergency Medicine

## 2014-10-02 ENCOUNTER — Telehealth (HOSPITAL_COMMUNITY): Payer: Self-pay

## 2014-10-02 DIAGNOSIS — Z862 Personal history of diseases of the blood and blood-forming organs and certain disorders involving the immune mechanism: Secondary | ICD-10-CM | POA: Insufficient documentation

## 2014-10-02 DIAGNOSIS — R739 Hyperglycemia, unspecified: Secondary | ICD-10-CM

## 2014-10-02 DIAGNOSIS — Z79899 Other long term (current) drug therapy: Secondary | ICD-10-CM | POA: Insufficient documentation

## 2014-10-02 DIAGNOSIS — Z87891 Personal history of nicotine dependence: Secondary | ICD-10-CM | POA: Insufficient documentation

## 2014-10-02 DIAGNOSIS — Z8619 Personal history of other infectious and parasitic diseases: Secondary | ICD-10-CM | POA: Diagnosis not present

## 2014-10-02 DIAGNOSIS — F410 Panic disorder [episodic paroxysmal anxiety] without agoraphobia: Secondary | ICD-10-CM | POA: Diagnosis not present

## 2014-10-02 DIAGNOSIS — Z91040 Latex allergy status: Secondary | ICD-10-CM | POA: Diagnosis not present

## 2014-10-02 DIAGNOSIS — K219 Gastro-esophageal reflux disease without esophagitis: Secondary | ICD-10-CM | POA: Diagnosis not present

## 2014-10-02 DIAGNOSIS — Z794 Long term (current) use of insulin: Secondary | ICD-10-CM | POA: Diagnosis not present

## 2014-10-02 DIAGNOSIS — E1065 Type 1 diabetes mellitus with hyperglycemia: Secondary | ICD-10-CM | POA: Insufficient documentation

## 2014-10-02 DIAGNOSIS — E1043 Type 1 diabetes mellitus with diabetic autonomic (poly)neuropathy: Secondary | ICD-10-CM | POA: Insufficient documentation

## 2014-10-02 DIAGNOSIS — E109 Type 1 diabetes mellitus without complications: Secondary | ICD-10-CM

## 2014-10-02 DIAGNOSIS — E162 Hypoglycemia, unspecified: Secondary | ICD-10-CM

## 2014-10-02 LAB — COMPREHENSIVE METABOLIC PANEL
ALK PHOS: 61 U/L (ref 38–126)
ALT: 34 U/L (ref 14–54)
ANION GAP: 8 (ref 5–15)
AST: 51 U/L — AB (ref 15–41)
Albumin: 4.1 g/dL (ref 3.5–5.0)
BUN: 8 mg/dL (ref 6–20)
CHLORIDE: 102 mmol/L (ref 101–111)
CO2: 24 mmol/L (ref 22–32)
CREATININE: 0.67 mg/dL (ref 0.44–1.00)
Calcium: 9.1 mg/dL (ref 8.9–10.3)
GFR calc Af Amer: >60 mL/min (ref >60)
GFR calc non Af Amer: >60 mL/min (ref >60)
GLUCOSE: 359 mg/dL — AB (ref 65–99)
Potassium: 4.7 mmol/L (ref 3.5–5.1)
Sodium: 134 mmol/L — ABNORMAL LOW (ref 135–145)
Total Bilirubin: 0.7 mg/dL (ref 0.3–1.2)
Total Protein: 7.3 g/dL (ref 6.5–8.1)

## 2014-10-02 LAB — CBG MONITORING, ED
GLUCOSE-CAPILLARY: 350 mg/dL — AB (ref 65–99)
GLUCOSE-CAPILLARY: 295 mg/dL — AB (ref 65–99)
Glucose-Capillary: 352 mg/dL — ABNORMAL HIGH (ref 65–99)
Glucose-Capillary: 461 mg/dL — ABNORMAL HIGH (ref 65–99)

## 2014-10-02 LAB — BLOOD GAS, VENOUS
Acid-base deficit: 4.6 mmol/L — ABNORMAL HIGH (ref 0.0–2.0)
Bicarbonate: 21.4 mEq/L (ref 20.0–24.0)
Drawn by: 402071
FIO2: 0.21
O2 Saturation: 76.4 %
PCO2 VEN: 45.1 mmHg (ref 45.0–50.0)
PH VEN: 7.295 (ref 7.250–7.300)
Patient temperature: 98
TCO2: 20.2 mmol/L (ref 0–100)
pO2, Ven: 43.8 mmHg (ref 30.0–45.0)

## 2014-10-02 LAB — CBC WITH DIFFERENTIAL/PLATELET
BASOS ABS: 0 10*3/uL (ref 0.0–0.1)
BASOS PCT: 0 % (ref 0–1)
EOS ABS: 0 10*3/uL (ref 0.0–0.7)
Eosinophils Relative: 0 % (ref 0–5)
HCT: 40.2 % (ref 36.0–46.0)
Hemoglobin: 13.5 g/dL (ref 12.0–15.0)
LYMPHS ABS: 2.3 10*3/uL (ref 0.7–4.0)
LYMPHS PCT: 16 % (ref 12–46)
MCH: 30.4 pg (ref 26.0–34.0)
MCHC: 33.6 g/dL (ref 30.0–36.0)
MCV: 90.5 fL (ref 78.0–100.0)
MONO ABS: 0.9 10*3/uL (ref 0.1–1.0)
MONOS PCT: 7 % (ref 3–12)
Neutro Abs: 10.9 10*3/uL — ABNORMAL HIGH (ref 1.7–7.7)
Neutrophils Relative %: 77 % (ref 43–77)
PLATELETS: 277 10*3/uL (ref 150–400)
RBC: 4.44 MIL/uL (ref 3.87–5.11)
RDW: 12.7 % (ref 11.5–15.5)
WBC: 14.1 10*3/uL — ABNORMAL HIGH (ref 4.0–10.5)

## 2014-10-02 LAB — HCG, QUANTITATIVE, PREGNANCY: hCG, Beta Chain, Quant, S: <1 m[IU]/mL (ref <5)

## 2014-10-02 MED ORDER — PROMETHAZINE HCL 25 MG RE SUPP: 25.0000 mg | Freq: Four times a day (QID) | RECTAL | Status: DC | PRN

## 2014-10-02 MED ORDER — SODIUM CHLORIDE 0.9 % IV SOLN
INTRAVENOUS | Status: DC
  Filled 2014-10-02: qty 2.5

## 2014-10-02 MED ORDER — DEXTROSE-NACL 5-0.45 % IV SOLN: INTRAVENOUS | Status: DC

## 2014-10-02 MED ORDER — PROMETHAZINE HCL 25 MG/ML IJ SOLN
25.0000 mg | Freq: Four times a day (QID) | INTRAMUSCULAR | Status: DC | PRN
  Administered 2014-10-02: 25 mg via INTRAMUSCULAR
  Filled 2014-10-02: qty 1

## 2014-10-02 MED ORDER — SODIUM CHLORIDE 0.9 % IV BOLUS (SEPSIS)
1000.0000 mL | Freq: Once | INTRAVENOUS | Status: AC
  Administered 2014-10-02: 1000 mL via INTRAVENOUS

## 2014-10-02 MED ORDER — INSULIN ASPART 100 UNIT/ML ~~LOC~~ SOLN
3.0000 [IU] | Freq: Once | SUBCUTANEOUS | Status: AC
  Administered 2014-10-02: 3 [IU] via SUBCUTANEOUS
  Filled 2014-10-02: qty 1

## 2014-10-02 MED ORDER — PROMETHAZINE HCL 25 MG/ML IJ SOLN
12.5000 mg | Freq: Four times a day (QID) | INTRAMUSCULAR | Status: DC | PRN
  Filled 2014-10-02: qty 1

## 2014-10-02 NOTE — Consult Note (Signed)
[facility] Medical Consultation  Mandy [name] YBO:[id number] DOB: Aug 04, 1980 DOA: 10/02/2014 PCP: [name], MD   Requesting physician: [place] Date of consultation: 10/02/14 Reason for consultation: Hyperglycemia  Impression/Recommendations Active Problems:   Hyperglycemia    1. Hyperglycemia - 1. Patient tells me she would normally take 3-4 units of novolog at home for a BGL of 461, and she would then re-check her BGL in 30 mins after taking this. 2. Feel that the above is a fine plan.  3 units novolog ordered and recheck of BGL in 30 mins ordered. 3. If BGL improving on novolog, patient should be okay to go home.  She has no evidence of DKA at this time.    Chief Complaint: Hypoglycemia  HPI:  34 yo [name], "brittle" type 1 diabetic.  Patient presents to the ED after what was initially a hypoglycemic episode.  She had BGL of 60 at home after eating cookies and juice.  EMS arrived and gave her PO glucose as well.  After getting to the ED her BGL has trended up to 461 now.  While she was initially altered, and remained so even after BGL initially trended up, her mental status has now improved.  She is currently alert, sitting in chair in room, AAOx3.  Review of Systems:  12 systems reviewed and otherwise negative  Past Medical History  Diagnosis Date  . DM type 1 causing complication dx 2778    hyperglycemia +/- DKA + coma, severe hypoglycemia, gastroparesis, peripheral neuropathy  . Thyroid disease     hypothyroidism associated with pregnancy  . Back pain   . PONV (postoperative nausea and vomiting)   . Anxiety     hx panic attacks.   Marland Kitchen GERD (gastroesophageal reflux disease)   . DM gastroparesis   . Anemia     .Autoimmune hemolytic anemia. bone marrow bx 2008, Dr Marin Olp  . Colitis, Clostridium difficile 05/2014  . Fatty liver 01/2007    with hepatomegaly on CT scan.    Past Surgical History  Procedure Laterality Date  . Laparoscopy  02/2002, 06/2010   laparoscopy with lysis pelvic adhesions for pelvic pain  . Back surgery      age 27  . Tooth extraction    . Root canal  10/10/12  . Flexible sigmoidoscopy N/A 06/24/2014    Procedure: FLEXIBLE SIGMOIDOSCOPY;  Surgeon: Milus Banister, MD;  Location: [facility];  Service: Endoscopy;  Laterality: N/A;  . Esophagogastroduodenoscopy (egd) with propofol N/A 06/27/2014    Procedure: ESOPHAGOGASTRODUODENOSCOPY (EGD) WITH PROPOFOL;  Surgeon: Milus Banister, MD;  Location: [facility];  Service: Endoscopy;  Laterality: N/A;   Social History:  reports that she quit smoking about 14 years ago. She has quit using smokeless tobacco. She reports that she does not drink alcohol or use illicit drugs.  Allergies  Allergen Reactions  . Scallops [Shellfish Allergy] Anaphylaxis  . Morphine And Related Itching  . Latex Rash  . Other Rash    Sunscreen and grass   Family History  Problem Relation Age of Onset  . Diabetes Maternal Grandmother   . Cancer Maternal Grandmother     Leukemia  . Cancer Maternal Grandfather     Small cell lung cancer    Prior to Admission medications   Medication Sig Start Date End Date Taking? Authorizing Provider  acetaminophen (TYLENOL) 500 MG tablet Take 1,000 mg by mouth every 6 (six) hours as needed for moderate pain.    Yes Historical Provider, MD  ALPRAZolam Duanne Moron) 1  MG tablet Take 1 mg by mouth at bedtime as needed for anxiety.    Yes Historical Provider, MD  amitriptyline (ELAVIL) 50 MG tablet Take 2 tablets (100 mg total) by mouth at bedtime. 08/07/14  Yes Arnoldo Morale, MD  CALCIUM PO Take 2 tablets by mouth daily.   Yes Historical Provider, MD  cephALEXin (KEFLEX) 250 MG capsule Take 1 capsule (250 mg total) by mouth 4 (four) times daily. 09/28/14  Yes Tatyana Kirichenko, PA-C  cholestyramine (QUESTRAN) 4 G packet Take 1 packet (4 g total) by mouth 2 (two) times daily. 09/19/14  Yes Okey Regal, PA-C  CREON 24000 UNITS CPEP takes 3 capsules with every meal and 2  capsule with every snack 08/09/14  Yes Historical Provider, MD  cyanocobalamin 500 MCG tablet Take 500 mcg by mouth 2 (two) times daily.   Yes Historical Provider, MD  cyclobenzaprine (FLEXERIL) 10 MG tablet Take 10 mg by mouth 3 (three) times daily as needed (mouth pain).   Yes Historical Provider, MD  dicyclomine (BENTYL) 10 MG capsule Take 10 mg by mouth 4 (four) times daily -  before meals and at bedtime.   Yes Historical Provider, MD  feeding supplement, GLUCERNA SHAKE, (GLUCERNA SHAKE) LIQD Take 237 mLs by mouth 3 (three) times daily between meals. Patient taking differently: Take 237 mLs by mouth daily.  07/06/14  Yes Thurnell Lose, MD  gabapentin (NEURONTIN) 100 MG capsule Take 300 mg by mouth at bedtime.    Yes Historical Provider, MD  glucagon (GLUCAGON EMERGENCY) 1 MG injection Inject 1 mg into the muscle once as needed (severe hypoglycemia). 08/14/14  Yes Arnoldo Morale, MD  insulin aspart (NOVOLOG) 100 UNIT/ML injection Inject 0-5 Units into the skin 3 (three) times daily with meals. 08/01/14  Yes Silver Huguenin Elgergawy, MD  insulin glargine (LANTUS) 100 UNIT/ML injection Inject 0.2 mLs (20 Units total) into the skin every morning. Patient taking differently: Inject 15 Units into the skin every morning.  08/07/14  Yes Arnoldo Morale, MD  loperamide (IMODIUM) 2 MG capsule Take 1 capsule (2 mg total) by mouth 4 (four) times daily as needed for diarrhea or loose stools. 09/19/14  Yes Okey Regal, PA-C  Multiple Vitamin (MULTIVITAMIN WITH MINERALS) TABS Take 1 tablet by mouth every morning. Diabetic support vitamin   Yes Historical Provider, MD  ondansetron (ZOFRAN-ODT) 4 MG disintegrating tablet 4 mg every 8 hours as needed for nausea or vomiting. 07/06/14  Yes Historical Provider, MD  oxyCODONE-acetaminophen (PERCOCET) 5-325 MG per tablet Take 1 tablet by mouth every 4 (four) hours as needed for severe pain. 07/06/14  Yes Thurnell Lose, MD  promethazine (PHENERGAN) 25 MG suppository Place 1  suppository (25 mg total) rectally every 6 (six) hours as needed for nausea or vomiting. 09/12/14  Yes Arnoldo Morale, MD  promethazine (PHENERGAN) 25 MG tablet Take 1 tablet (25 mg total) by mouth every 6 (six) hours as needed for nausea or vomiting. 09/28/14  Yes Tatyana Kirichenko, PA-C  saccharomyces boulardii (FLORASTOR) 250 MG capsule Take 1 capsule (250 mg total) by mouth 2 (two) times daily. 07/06/14  Yes Thurnell Lose, MD  traZODone (DESYREL) 50 MG tablet Take 1 tablet (50 mg total) by mouth at bedtime as needed for sleep. Patient taking differently: Take 50 mg by mouth at bedtime.  09/12/14  Yes Arnoldo Morale, MD  Acetaminophen-Codeine (TYLENOL/CODEINE #3) 300-30 MG per tablet Take 1 tablet by mouth every 6 (six) hours as needed for pain. Patient not taking: Reported on 09/19/2014  08/07/14   Arnoldo Morale, MD  amoxicillin (AMOXIL) 500 MG capsule Take 1 capsule (500 mg total) by mouth 3 (three) times daily. Patient not taking: Reported on 08/30/2014 08/19/14   Nat Christen, MD   Physical Exam: Blood pressure 102/63, pulse 104, temperature 98 [name] (36.7 C), temperature source Oral, resp. rate 20, last menstrual period 02/18/2014, SpO2 100 %. Filed Vitals:   10/02/14 0023  BP: 102/63  Pulse: 104  Temp: 98 [name] (36.7 C)  Resp: 20     General:  NAD, resting comfortably in bed  Eyes: PEERLA EOMI  ENT: mucous membranes moist  Neck: supple w/o JVD  Cardiovascular: RRR w/o MRG  Respiratory: CTA B  Abdomen: soft, nt, nd, bs+  Skin: no rash nor lesion  Musculoskeletal: MAE, full ROM all 4 extremities  Psychiatric: normal tone and affect  Neurologic: AAOx3, grossly non-focal   Labs on Admission:  Basic Metabolic Panel:  Recent Labs Lab 09/28/14 1110 10/02/14 0141  NA 131* 134*  K 4.7 4.7  CL 97* 102  CO2 27 24  GLUCOSE 466* 359*  BUN 16 8  CREATININE 0.77 0.67  CALCIUM 9.2 9.1   Liver Function Tests:  Recent Labs Lab 09/28/14 1110 10/02/14 0141  AST 20 51*  ALT 23 34   ALKPHOS 62 61  BILITOT 0.4 0.7  PROT 7.3 7.3  ALBUMIN 4.2 4.1    Recent Labs Lab 09/28/14 1110  LIPASE 10*   No results for input(s): AMMONIA in the last 168 hours. CBC:  Recent Labs Lab 09/28/14 1110 10/02/14 0241  WBC 5.8 14.1*  NEUTROABS  --  10.9*  HGB 11.7* Mandy.5  HCT 35.2* 40.2  MCV 89.6 90.5  PLT 238 277   Cardiac Enzymes: No results for input(s): CKTOTAL, CKMB, CKMBINDEX, TROPONINI in the last 168 hours. BNP: Invalid input(s): POCBNP CBG:  Recent Labs Lab 09/28/14 1252 09/28/14 1435 10/02/14 0021 10/02/14 0137 10/02/14 0414  GLUCAP 358* 289* 352* 350* 461*    Radiological Exams on Admission: No results found.  EKG: Independently reviewed.  Time spent: 50 min  GARDNER, JARED M. [facility] Pager [phone]  If 7PM-7AM, please contact night-coverage [email or web address] Password [email or web address] 10/02/2014, 5:19 AM

## 2014-10-02 NOTE — ED Provider Notes (Addendum)
CSN: [id number]     Arrival date & time 10/02/14  0003 History  This chart was scribed for Mandy Rosser, MD by [name], ED Scribe. This patient was seen in room WA25/WA25 and the patient's care was started 1:30 AM.   Chief Complaint  Patient presents with  . Hypoglycemia   The history is provided by the EMS personnel and the spouse. The history is limited by the condition of the patient. No language interpreter was used.    LEVEL 5 CAVEAT: ALTERED MENTAL STATUS   HPI Comments: [name] is a 34 y.o. female, who is a brittle diabetic, brought in by ambulance, who presents to the Emergency Department complaining of sudden onset, waxing and waning hypoglycemia onset pta. The pt was found by her family to be unresponsive with an initial CBG of 22 for approximately 20 minutes, per husband. Pt took 10 units of fast acting insulin and consumed candy and orange juice which elevated her CBG into the 70s. The patient then fell asleep after taking her insulin and husband was having difficulty waking her up, stating he was 'violently shaking her' which is normally enough to wake her. Upon EMS arrival her CBG was reading in the 60s. Pt has chronic diarrhea after being diagnosed with Cdiff in December. Husband also notes pt vomits 2 days per week at baseline.  Husband states pt cannot fall asleep with a blood glucose reading below 350 due to her inevitably waking up 1 hour later with readings in the 40s. Husband additionally states pt has been unable to sleep well which he attributes to her chronic diarrhea. CBG currently is 350.   Past Medical History  Diagnosis Date  . DM type 1 causing complication dx 2130    hyperglycemia +/- DKA + coma, severe hypoglycemia, gastroparesis, peripheral neuropathy  . Thyroid disease     hypothyroidism associated with pregnancy  . Back pain   . PONV (postoperative nausea and vomiting)   . Anxiety     hx panic attacks.   Marland Kitchen GERD (gastroesophageal reflux disease)    . DM gastroparesis   . Anemia     .Autoimmune hemolytic anemia. bone marrow bx 2008, Dr Marin Olp  . Colitis, Clostridium difficile 05/2014  . Fatty liver 01/2007    with hepatomegaly on CT scan.    Past Surgical History  Procedure Laterality Date  . Laparoscopy  02/2002, 06/2010    laparoscopy with lysis pelvic adhesions for pelvic pain  . Back surgery      age 47  . Tooth extraction    . Root canal  10/10/12  . Flexible sigmoidoscopy N/A 06/24/2014    Procedure: FLEXIBLE SIGMOIDOSCOPY;  Surgeon: Milus Banister, MD;  Location: [facility];  Service: Endoscopy;  Laterality: N/A;  . Esophagogastroduodenoscopy (egd) with propofol N/A 06/27/2014    Procedure: ESOPHAGOGASTRODUODENOSCOPY (EGD) WITH PROPOFOL;  Surgeon: Milus Banister, MD;  Location: [facility];  Service: Endoscopy;  Laterality: N/A;   Family History  Problem Relation Age of Onset  . Diabetes Maternal Grandmother   . Cancer Maternal Grandmother     Leukemia  . Cancer Maternal Grandfather     Small cell lung cancer   Social History  Substance Use Topics  . Smoking status: Former Smoker    Quit date: 04/19/2000  . Smokeless tobacco: Former Systems developer  . Alcohol Use: No     Comment: RARE SIPS OF WINE   OB History    No data available     Review  of Systems  Unable to perform ROS: Mental status change   Allergies  Scallops; Morphine and related; Latex; and Other  Home Medications   Prior to Admission medications   Medication Sig Start Date End Date Taking? Authorizing Provider  acetaminophen (TYLENOL) 500 MG tablet Take 1,000 mg by mouth every 6 (six) hours as needed for moderate pain.    Yes Historical Provider, MD  ALPRAZolam Duanne Moron) 1 MG tablet Take 1 mg by mouth at bedtime as needed for anxiety.    Yes Historical Provider, MD  amitriptyline (ELAVIL) 50 MG tablet Take 2 tablets (100 mg total) by mouth at bedtime. 08/07/14  Yes Arnoldo Morale, MD  CALCIUM PO Take 2 tablets by mouth daily.   Yes Historical Provider, MD   cephALEXin (KEFLEX) 250 MG capsule Take 1 capsule (250 mg total) by mouth 4 (four) times daily. 09/28/14  Yes Tatyana Kirichenko, PA-C  cholestyramine (QUESTRAN) 4 G packet Take 1 packet (4 g total) by mouth 2 (two) times daily. 09/19/14  Yes Okey Regal, PA-C  CREON 24000 UNITS CPEP takes 3 capsules with every meal and 2 capsule with every snack 08/09/14  Yes Historical Provider, MD  cyanocobalamin 500 MCG tablet Take 500 mcg by mouth 2 (two) times daily.   Yes Historical Provider, MD  cyclobenzaprine (FLEXERIL) 10 MG tablet Take 10 mg by mouth 3 (three) times daily as needed (mouth pain).   Yes Historical Provider, MD  dicyclomine (BENTYL) 10 MG capsule Take 10 mg by mouth 4 (four) times daily -  before meals and at bedtime.   Yes Historical Provider, MD  feeding supplement, GLUCERNA SHAKE, (GLUCERNA SHAKE) LIQD Take 237 mLs by mouth 3 (three) times daily between meals. Patient taking differently: Take 237 mLs by mouth daily.  07/06/14  Yes Thurnell Lose, MD  gabapentin (NEURONTIN) 100 MG capsule Take 300 mg by mouth at bedtime.    Yes Historical Provider, MD  glucagon (GLUCAGON EMERGENCY) 1 MG injection Inject 1 mg into the muscle once as needed (severe hypoglycemia). 08/14/14  Yes Arnoldo Morale, MD  insulin aspart (NOVOLOG) 100 UNIT/ML injection Inject 0-5 Units into the skin 3 (three) times daily with meals. 08/01/14  Yes Silver Huguenin Elgergawy, MD  insulin glargine (LANTUS) 100 UNIT/ML injection Inject 0.2 mLs (20 Units total) into the skin every morning. Patient taking differently: Inject 15 Units into the skin every morning.  08/07/14  Yes Arnoldo Morale, MD  loperamide (IMODIUM) 2 MG capsule Take 1 capsule (2 mg total) by mouth 4 (four) times daily as needed for diarrhea or loose stools. 09/19/14  Yes Okey Regal, PA-C  Multiple Vitamin (MULTIVITAMIN WITH MINERALS) TABS Take 1 tablet by mouth every morning. Diabetic support vitamin   Yes Historical Provider, MD  ondansetron (ZOFRAN-ODT) 4 MG  disintegrating tablet 4 mg every 8 hours as needed for nausea or vomiting. 07/06/14  Yes Historical Provider, MD  oxyCODONE-acetaminophen (PERCOCET) 5-325 MG per tablet Take 1 tablet by mouth every 4 (four) hours as needed for severe pain. 07/06/14  Yes Thurnell Lose, MD  promethazine (PHENERGAN) 25 MG suppository Place 1 suppository (25 mg total) rectally every 6 (six) hours as needed for nausea or vomiting. 09/12/14  Yes Arnoldo Morale, MD  promethazine (PHENERGAN) 25 MG tablet Take 1 tablet (25 mg total) by mouth every 6 (six) hours as needed for nausea or vomiting. 09/28/14  Yes Tatyana Kirichenko, PA-C  saccharomyces boulardii (FLORASTOR) 250 MG capsule Take 1 capsule (250 mg total) by mouth 2 (two) times  daily. 07/06/14  Yes Thurnell Lose, MD  traZODone (DESYREL) 50 MG tablet Take 1 tablet (50 mg total) by mouth at bedtime as needed for sleep. Patient taking differently: Take 50 mg by mouth at bedtime.  09/12/14  Yes Arnoldo Morale, MD  Acetaminophen-Codeine (TYLENOL/CODEINE #3) 300-30 MG per tablet Take 1 tablet by mouth every 6 (six) hours as needed for pain. Patient not taking: Reported on 09/19/2014 08/07/14   Arnoldo Morale, MD  amoxicillin (AMOXIL) 500 MG capsule Take 1 capsule (500 mg total) by mouth 3 (three) times daily. Patient not taking: Reported on 08/30/2014 08/19/14   Nat Christen, MD   BP 102/63 mmHg  Pulse 104  Temp(Src) 98 F (36.7 C) (Oral)  Resp 20  SpO2 100%  LMP 02/18/2014 Physical Exam  General: Well-developed, well-nourished female in no acute distress; appearance consistent with age of record HENT: normocephalic; atraumatic Eyes: pupils equal, round and reactive to light; extraocular muscles intact Neck: supple Heart: regular rate and rhythm Lungs: clear to auscultation bilaterally Abdomen: soft; nondistended; nontender; no masses or hepatosplenomegaly; bowel sounds present Extremities: No deformity; full range of motion; pulses normal Neurologic: somnolent but arousable;  noted to move all extremities; motor function intact in all extremities and symmetric; no facial droop Skin: Warm and dry Psychiatric: Normal mood and affect ED Course  Procedures   MDM   Nursing notes and vitals signs, including pulse oximetry, reviewed.  Summary of this visit's results, reviewed by myself:  Labs:  Results for orders placed or performed during the hospital encounter of 10/02/14 (from the past 24 hour(s))  CBG monitoring, ED     Status: Abnormal   Collection Time: 10/02/14 12:21 AM  Result Value Ref Range   Glucose-Capillary 352 (H) 65 - 99 mg/dL   Comment 1 Notify RN    Comment 2 Document in Chart   POC CBG, ED     Status: Abnormal   Collection Time: 10/02/14  1:37 AM  Result Value Ref Range   Glucose-Capillary 350 (H) 65 - 99 mg/dL   Comment 1 Notify RN    Comment 2 Document in Chart   Comprehensive metabolic panel     Status: Abnormal   Collection Time: 10/02/14  1:41 AM  Result Value Ref Range   Sodium 134 (L) 135 - 145 mmol/L   Potassium 4.7 3.5 - 5.1 mmol/L   Chloride 102 101 - 111 mmol/L   CO2 24 22 - 32 mmol/L   Glucose, Bld 359 (H) 65 - 99 mg/dL   BUN 8 6 - 20 mg/dL   Creatinine, Ser 0.67 0.44 - 1.00 mg/dL   Calcium 9.1 8.9 - 10.3 mg/dL   Total Protein 7.3 6.5 - 8.1 g/dL   Albumin 4.1 3.5 - 5.0 g/dL   AST 51 (H) 15 - 41 U/L   ALT 34 14 - 54 U/L   Alkaline Phosphatase 61 38 - 126 U/L   Total Bilirubin 0.7 0.3 - 1.2 mg/dL   GFR calc non Af Amer >60 >60 mL/min   GFR calc Af Amer >60 >60 mL/min   Anion gap 8 5 - 15  hCG, quantitative, pregnancy     Status: None   Collection Time: 10/02/14  1:41 AM  Result Value Ref Range   hCG, Beta Chain, Quant, S <1 <5 mIU/mL  CBC with Differential/Platelet     Status: Abnormal   Collection Time: 10/02/14  2:41 AM  Result Value Ref Range   WBC 14.1 (H) 4.0 - 10.5 K/uL  RBC 4.44 3.87 - 5.11 MIL/uL   Hemoglobin 13.5 12.0 - 15.0 g/dL   HCT 40.2 36.0 - 46.0 %   MCV 90.5 78.0 - 100.0 fL   MCH 30.4 26.0 -  34.0 pg   MCHC 33.6 30.0 - 36.0 g/dL   RDW 12.7 11.5 - 15.5 %   Platelets 277 150 - 400 K/uL   Neutrophils Relative % 77 43 - 77 %   Neutro Abs 10.9 (H) 1.7 - 7.7 K/uL   Lymphocytes Relative 16 12 - 46 %   Lymphs Abs 2.3 0.7 - 4.0 K/uL   Monocytes Relative 7 3 - 12 %   Monocytes Absolute 0.9 0.1 - 1.0 K/uL   Eosinophils Relative 0 0 - 5 %   Eosinophils Absolute 0.0 0.0 - 0.7 K/uL   Basophils Relative 0 0 - 1 %   Basophils Absolute 0.0 0.0 - 0.1 K/uL  CBG monitoring, ED     Status: Abnormal   Collection Time: 10/02/14  4:14 AM  Result Value Ref Range   Glucose-Capillary 461 (H) 65 - 99 mg/dL   Comment 1 Notify RN    Comment 2 Document in Chart   Blood gas, venous     Status: Abnormal   Collection Time: 10/02/14  4:29 AM  Result Value Ref Range   FIO2 0.21    pH, Ven 7.295 7.250 - 7.300   pCO2, Ven 45.1 45.0 - 50.0 mmHg   pO2, Ven 43.8 30.0 - 45.0 mmHg   Bicarbonate 21.4 20.0 - 24.0 mEq/L   TCO2 20.2 0 - 100 mmol/L   Acid-base deficit 4.6 (H) 0.0 - 2.0 mmol/L   O2 Saturation 76.4 %   Patient temperature 98.0    Collection site VEIN    Drawn by [id number]    Sample type VEIN    4:44 AM Patient continues to remain somnolent but arousable. She is complaining of diffuse abdominal pain. Her blood sugar has been increasing and is now 461.   7:21 AM Blood sugar now below 300 after subcutaneous insulin. Patient is feeling better. Nausea improved after IM Phenergan. She has an appointment at [facility] tomorrow to discuss pancreas transplant.  I personally performed the services described in this documentation, which was scribed in my presence. The recorded information has been reviewed and is accurate.    Mandy Rosser, MD 10/02/14 [place], MD 10/02/14 [phone]

## 2014-10-02 NOTE — Telephone Encounter (Signed)
Post ED Visit - Positive Culture Follow-up  Culture report reviewed by antimicrobial stewardship pharmacist: []  Wes Dulaney, Pharm.D., BCPS []  Heide Guile, Pharm.D., BCPS []  Alycia Rossetti, Pharm.D., BCPS []  [place], Pharm.D., BCPS, AAHIVP []  Legrand Como, Pharm.D., BCPS, AAHIVP []  Isac Sarna, Pharm.D., BCPS X  Stone,T. Pharm.  Positive Urine culture, >/= 100,000 colonies -> E Coli & 40,000 colonies -> Klebsiella Pneumoniae Treated with Cephalexin, organism sensitive to the same and no further patient follow-up is required at this time.  Dortha Kern 10/02/2014, 10:47 AM

## 2014-10-02 NOTE — ED Notes (Signed)
Only appeared to have infused maybe 200 ml IVF, alerted Molpus MD who stated to continue with D/C

## 2014-10-02 NOTE — ED Notes (Signed)
IV team at pt bedside 

## 2014-10-02 NOTE — ED Notes (Signed)
IV team was unable to get blood from pt. Phlebotomy was called

## 2014-10-02 NOTE — ED Notes (Signed)
Pt from home via EMS with hypoglycemia. Current CBG is 358. EMS reported that when they arrived on the scene, her CBG was 60. Pt also has gastroparesis. Pt was disoriented with EMS, but she ate a package of cookies and drank orange juice. Pt now is experiencing stomach discomfort and had two "large watery bowel movements

## 2014-10-03 ENCOUNTER — Ambulatory Visit: Payer: Self-pay | Admitting: Internal Medicine

## 2014-10-10 ENCOUNTER — Emergency Department (HOSPITAL_COMMUNITY)
Admission: EM | Admit: 2014-10-10 | Discharge: 2014-10-10 | Disposition: A | Discharge status: Home / Self Care | Payer: Medicaid Other | Attending: Emergency Medicine | Admitting: Emergency Medicine

## 2014-10-10 ENCOUNTER — Encounter (HOSPITAL_COMMUNITY): Payer: Self-pay | Admitting: *Deleted

## 2014-10-10 DIAGNOSIS — R1084 Generalized abdominal pain: Secondary | ICD-10-CM | POA: Insufficient documentation

## 2014-10-10 DIAGNOSIS — Z8619 Personal history of other infectious and parasitic diseases: Secondary | ICD-10-CM | POA: Diagnosis not present

## 2014-10-10 DIAGNOSIS — Z862 Personal history of diseases of the blood and blood-forming organs and certain disorders involving the immune mechanism: Secondary | ICD-10-CM | POA: Diagnosis not present

## 2014-10-10 DIAGNOSIS — Z3202 Encounter for pregnancy test, result negative: Secondary | ICD-10-CM | POA: Insufficient documentation

## 2014-10-10 DIAGNOSIS — Z91040 Latex allergy status: Secondary | ICD-10-CM | POA: Diagnosis not present

## 2014-10-10 DIAGNOSIS — Z87891 Personal history of nicotine dependence: Secondary | ICD-10-CM | POA: Insufficient documentation

## 2014-10-10 DIAGNOSIS — F419 Anxiety disorder, unspecified: Secondary | ICD-10-CM | POA: Diagnosis not present

## 2014-10-10 DIAGNOSIS — E1065 Type 1 diabetes mellitus with hyperglycemia: Secondary | ICD-10-CM | POA: Insufficient documentation

## 2014-10-10 DIAGNOSIS — K219 Gastro-esophageal reflux disease without esophagitis: Secondary | ICD-10-CM | POA: Diagnosis not present

## 2014-10-10 DIAGNOSIS — Z79899 Other long term (current) drug therapy: Secondary | ICD-10-CM | POA: Diagnosis not present

## 2014-10-10 DIAGNOSIS — Z794 Long term (current) use of insulin: Secondary | ICD-10-CM | POA: Insufficient documentation

## 2014-10-10 DIAGNOSIS — R112 Nausea with vomiting, unspecified: Secondary | ICD-10-CM | POA: Diagnosis not present

## 2014-10-10 DIAGNOSIS — R739 Hyperglycemia, unspecified: Secondary | ICD-10-CM

## 2014-10-10 LAB — POC URINE PREG, ED: PREG TEST UR: NEGATIVE

## 2014-10-10 LAB — COMPREHENSIVE METABOLIC PANEL
ALT: 61 U/L — AB (ref 14–54)
AST: 58 U/L — ABNORMAL HIGH (ref 15–41)
Albumin: 5 g/dL (ref 3.5–5.0)
Alkaline Phosphatase: 71 U/L (ref 38–126)
Anion gap: 13 (ref 5–15)
BILIRUBIN TOTAL: 0.4 mg/dL (ref 0.3–1.2)
BUN: 16 mg/dL (ref 6–20)
CALCIUM: 9.5 mg/dL (ref 8.9–10.3)
CO2: 27 mmol/L (ref 22–32)
CREATININE: 0.76 mg/dL (ref 0.44–1.00)
Chloride: 92 mmol/L — ABNORMAL LOW (ref 101–111)
GFR calc Af Amer: >60 mL/min (ref >60)
Glucose, Bld: 565 mg/dL (ref 65–99)
Potassium: 4.8 mmol/L (ref 3.5–5.1)
Sodium: 132 mmol/L — ABNORMAL LOW (ref 135–145)
Total Protein: 8.4 g/dL — ABNORMAL HIGH (ref 6.5–8.1)

## 2014-10-10 LAB — LIPASE, BLOOD: Lipase: 13 U/L — ABNORMAL LOW (ref 22–51)

## 2014-10-10 LAB — CBG MONITORING, ED
GLUCOSE-CAPILLARY: 314 mg/dL — AB (ref 65–99)
Glucose-Capillary: 388 mg/dL — ABNORMAL HIGH (ref 65–99)

## 2014-10-10 LAB — CBC
HCT: 42 % (ref 36.0–46.0)
Hemoglobin: 14.4 g/dL (ref 12.0–15.0)
MCH: 30.3 pg (ref 26.0–34.0)
MCHC: 34.3 g/dL (ref 30.0–36.0)
MCV: 88.4 fL (ref 78.0–100.0)
PLATELETS: ADEQUATE 10*3/uL (ref 150–400)
RBC: 4.75 MIL/uL (ref 3.87–5.11)
RDW: 12.7 % (ref 11.5–15.5)
WBC: 8.7 10*3/uL (ref 4.0–10.5)

## 2014-10-10 LAB — URINE MICROSCOPIC-ADD ON

## 2014-10-10 LAB — URINALYSIS, ROUTINE W REFLEX MICROSCOPIC
BILIRUBIN URINE: NEGATIVE
Glucose, UA: >1000 mg/dL — AB
Hgb urine dipstick: NEGATIVE
KETONES UR: NEGATIVE mg/dL
Leukocytes, UA: NEGATIVE
NITRITE: NEGATIVE
Protein, ur: NEGATIVE mg/dL
Specific Gravity, Urine: 1.037 — ABNORMAL HIGH (ref 1.005–1.030)
UROBILINOGEN UA: 0.2 mg/dL (ref 0.0–1.0)
pH: 6.5 (ref 5.0–8.0)

## 2014-10-10 MED ORDER — PROMETHAZINE HCL 25 MG/ML IJ SOLN
12.5000 mg | Freq: Once | INTRAMUSCULAR | Status: AC
  Administered 2014-10-10: 12.5 mg via INTRAVENOUS
  Filled 2014-10-10: qty 1

## 2014-10-10 MED ORDER — HYDROMORPHONE HCL 1 MG/ML IJ SOLN
0.5000 mg | Freq: Once | INTRAMUSCULAR | Status: AC
  Administered 2014-10-10: 0.5 mg via INTRAVENOUS
  Filled 2014-10-10: qty 1

## 2014-10-10 MED ORDER — PROMETHAZINE HCL 25 MG RE SUPP: 25.0000 mg | Freq: Four times a day (QID) | RECTAL | Status: DC | PRN

## 2014-10-10 MED ORDER — SODIUM CHLORIDE 0.9 % IV BOLUS (SEPSIS)
1000.0000 mL | Freq: Once | INTRAVENOUS | Status: AC
  Administered 2014-10-10: 1000 mL via INTRAVENOUS

## 2014-10-10 MED ORDER — HYOSCYAMINE SULFATE 0.5 MG/ML IJ SOLN
0.1250 mg | Freq: Once | INTRAMUSCULAR | Status: AC
  Administered 2014-10-10: 0.125 mg via INTRAVENOUS
  Filled 2014-10-10: qty 0.25

## 2014-10-10 MED ORDER — SODIUM CHLORIDE 0.9 % IV BOLUS (SEPSIS)
2000.0000 mL | Freq: Once | INTRAVENOUS | Status: AC
  Administered 2014-10-10: 2000 mL via INTRAVENOUS

## 2014-10-10 MED ORDER — ONDANSETRON HCL 4 MG/2ML IJ SOLN: 4.0000 mg | Freq: Once | INTRAMUSCULAR | Status: DC

## 2014-10-10 NOTE — Discharge Instructions (Signed)

## 2014-10-10 NOTE — ED Provider Notes (Signed)
CSN: [id number]     Arrival date & time 10/10/14  1517 History   First MD Initiated Contact with Patient 10/10/14 1700     Chief Complaint  Patient presents with  . Abdominal Pain  . Emesis     (Consider location/radiation/quality/duration/timing/severity/associated sxs/prior Treatment) HPI Comments: Patient with a complicated medical history including brittle T1DM, gastroparesis, anemia presents with recurrent generalized abdominal pain, nausea and vomiting. No fever. Peaks in her blood sugar have been higher than her usual. Symptoms started today and have been without fever, significant diarrhea. She reports her pain and nausea are usual conditions but has been worse today.   Patient is a 34 y.o. female presenting with abdominal pain and vomiting. The history is provided by the patient. No language interpreter was used.  Abdominal Pain Pain location:  Generalized Pain quality: cramping and sharp   Pain radiates to:  Does not radiate Pain severity:  Severe Associated symptoms: nausea and vomiting   Associated symptoms: no chest pain, no chills, no dysuria, no fever and no shortness of breath   Emesis Associated symptoms: abdominal pain   Associated symptoms: no chills and no myalgias     Past Medical History  Diagnosis Date  . DM type 1 causing complication dx 2706    hyperglycemia +/- DKA + coma, severe hypoglycemia, gastroparesis, peripheral neuropathy  . Thyroid disease     hypothyroidism associated with pregnancy  . Back pain   . PONV (postoperative nausea and vomiting)   . Anxiety     hx panic attacks.   Marland Kitchen GERD (gastroesophageal reflux disease)   . DM gastroparesis   . Anemia     .Autoimmune hemolytic anemia. bone marrow bx 2008, Dr Marin Olp  . Colitis, Clostridium difficile 05/2014  . Fatty liver 01/2007    with hepatomegaly on CT scan.    Past Surgical History  Procedure Laterality Date  . Laparoscopy  02/2002, 06/2010    laparoscopy with lysis pelvic adhesions for  pelvic pain  . Back surgery      age 61  . Tooth extraction    . Root canal  10/10/12  . Flexible sigmoidoscopy N/A 06/24/2014    Procedure: FLEXIBLE SIGMOIDOSCOPY;  Surgeon: Milus Banister, MD;  Location: [facility];  Service: Endoscopy;  Laterality: N/A;  . Esophagogastroduodenoscopy (egd) with propofol N/A 06/27/2014    Procedure: ESOPHAGOGASTRODUODENOSCOPY (EGD) WITH PROPOFOL;  Surgeon: Milus Banister, MD;  Location: [facility];  Service: Endoscopy;  Laterality: N/A;   Family History  Problem Relation Age of Onset  . Diabetes Maternal Grandmother   . Cancer Maternal Grandmother     Leukemia  . Cancer Maternal Grandfather     Small cell lung cancer   Social History  Substance Use Topics  . Smoking status: Former Smoker    Quit date: 04/19/2000  . Smokeless tobacco: Former Systems developer  . Alcohol Use: No     Comment: RARE SIPS OF WINE   OB History    No data available     Review of Systems  Constitutional: Negative for fever and chills.  Respiratory: Negative.  Negative for shortness of breath.   Cardiovascular: Negative.  Negative for chest pain.  Gastrointestinal: Positive for nausea, vomiting and abdominal pain.  Genitourinary: Negative.  Negative for dysuria.  Musculoskeletal: Negative.  Negative for myalgias.  Skin: Negative.  Negative for pallor and rash.  Neurological: Negative.       Allergies  Scallops; Morphine and related; Latex; and Other  Home Medications  Prior to Admission medications   Medication Sig Start Date End Date Taking? Authorizing Provider  acetaminophen (TYLENOL) 500 MG tablet Take 1,000 mg by mouth every 6 (six) hours as needed for moderate pain.    Yes Historical Provider, MD  ALPRAZolam Duanne Moron) 1 MG tablet Take 1 mg by mouth at bedtime as needed for anxiety.    Yes Historical Provider, MD  amitriptyline (ELAVIL) 50 MG tablet Take 2 tablets (100 mg total) by mouth at bedtime. 08/07/14  Yes Arnoldo Morale, MD  CALCIUM PO Take 2 tablets by mouth  daily.   Yes Historical Provider, MD  cholestyramine Lucrezia Starch) 4 G packet Take 1 packet (4 g total) by mouth 2 (two) times daily. Patient taking differently: Take 4 g by mouth 3 (three) times daily.  09/19/14  Yes Okey Regal, PA-C  CREON 24000 UNITS CPEP takes 3 capsules with every meal and 2 capsule with every snack 08/09/14  Yes Historical Provider, MD  cyanocobalamin 500 MCG tablet Take 500 mcg by mouth 2 (two) times daily.   Yes Historical Provider, MD  cyclobenzaprine (FLEXERIL) 10 MG tablet Take 10 mg by mouth 3 (three) times daily as needed (mouth pain).   Yes Historical Provider, MD  dicyclomine (BENTYL) 10 MG capsule Take 10 mg by mouth 4 (four) times daily -  before meals and at bedtime.   Yes Historical Provider, MD  feeding supplement, GLUCERNA SHAKE, (GLUCERNA SHAKE) LIQD Take 237 mLs by mouth 3 (three) times daily between meals. Patient taking differently: Take 237 mLs by mouth daily.  07/06/14  Yes Thurnell Lose, MD  gabapentin (NEURONTIN) 100 MG capsule Take 300 mg by mouth at bedtime.    Yes Historical Provider, MD  glucagon (GLUCAGON EMERGENCY) 1 MG injection Inject 1 mg into the muscle once as needed (severe hypoglycemia). 08/14/14  Yes Arnoldo Morale, MD  Glucosamine Sulfate 500 MG TABS Take 1 tablet by mouth daily at 12 noon.    Yes Historical Provider, MD  insulin aspart (NOVOLOG) 100 UNIT/ML injection Inject 0-5 Units into the skin 3 (three) times daily with meals. 08/01/14  Yes Silver Huguenin Elgergawy, MD  insulin glargine (LANTUS) 100 UNIT/ML injection Inject 0.2 mLs (20 Units total) into the skin every morning. Patient taking differently: Inject 7 Units into the skin 2 (two) times daily. 7 units in the morning and 7 units at night 08/07/14  Yes Arnoldo Morale, MD  loperamide (IMODIUM) 2 MG capsule Take 1 capsule (2 mg total) by mouth 4 (four) times daily as needed for diarrhea or loose stools. 09/19/14  Yes Okey Regal, PA-C  Multiple Vitamin (MULTIVITAMIN WITH MINERALS) TABS Take  1 tablet by mouth every morning. Diabetic support vitamin   Yes Historical Provider, MD  omeprazole (PRILOSEC) 40 MG capsule Take by mouth.   Yes Historical Provider, MD  ondansetron (ZOFRAN-ODT) 4 MG disintegrating tablet 4 mg every 8 hours as needed for nausea or vomiting. 07/06/14  Yes Historical Provider, MD  promethazine (PHENERGAN) 25 MG suppository Place 1 suppository (25 mg total) rectally every 6 (six) hours as needed for nausea or vomiting. 10/02/14  Yes John Molpus, MD  saccharomyces boulardii (FLORASTOR) 250 MG capsule Take 1 capsule (250 mg total) by mouth 2 (two) times daily. 07/06/14  Yes Thurnell Lose, MD  traZODone (DESYREL) 50 MG tablet Take 1 tablet (50 mg total) by mouth at bedtime as needed for sleep. Patient taking differently: Take 50 mg by mouth at bedtime.  09/12/14  Yes Arnoldo Morale, MD  cephALEXin (  KEFLEX) 250 MG capsule Take 1 capsule (250 mg total) by mouth 4 (four) times daily. Patient not taking: Reported on 10/10/2014 09/28/14   Tatyana Kirichenko, PA-C   BP 107/76 mmHg  Pulse 88  Temp(Src) 98.9 F (37.2 C) (Oral)  Resp 18  SpO2 100%  LMP 02/18/2014 Physical Exam  Constitutional: She is oriented to person, place, and time. She appears well-developed and well-nourished.  HENT:  Head: Normocephalic.  Neck: Normal range of motion. Neck supple.  Cardiovascular: Normal rate and regular rhythm.   Pulmonary/Chest: Effort normal and breath sounds normal.  Abdominal: Soft. Bowel sounds are normal. There is tenderness. There is no rebound and no guarding.  Generalized tenderness with guarding.   Musculoskeletal: Normal range of motion.  Neurological: She is alert and oriented to person, place, and time.  Skin: Skin is warm and dry. No rash noted.  Psychiatric: She has a normal mood and affect.    ED Course  Procedures (including critical care time) Labs Review Labs Reviewed  LIPASE, BLOOD - Abnormal; Notable for the following:    Lipase 13 (*)    All other  components within normal limits  COMPREHENSIVE METABOLIC PANEL - Abnormal; Notable for the following:    Sodium 132 (*)    Chloride 92 (*)    Glucose, Bld 565 (*)    Total Protein 8.4 (*)    AST 58 (*)    ALT 61 (*)    All other components within normal limits  URINALYSIS, ROUTINE W REFLEX MICROSCOPIC (NOT AT [facility]) - Abnormal; Notable for the following:    Specific Gravity, Urine 1.037 (*)    Glucose, UA >1000 (*)    All other components within normal limits  CBG MONITORING, ED - Abnormal; Notable for the following:    Glucose-Capillary 388 (*)    All other components within normal limits  CBG MONITORING, ED - Abnormal; Notable for the following:    Glucose-Capillary 314 (*)    All other components within normal limits  CBC  URINE MICROSCOPIC-ADD ON  POC URINE PREG, ED  POC OCCULT BLOOD, ED    Imaging Review No results found. I have personally reviewed and evaluated these images and lab results as part of my medical decision-making.   EKG Interpretation None      MDM   Final diagnoses:  None    1. Abdominal pain, acute on chronic 2. T1DM with hyperglycemia 3. Nausea and vomiting  No further vomiting in ED. Pain and nausea is improved with IV medications. No evidence DKA with CBG of 588 initially. Patient is given 3 liters fluids with drop in sugar to 314. She is tolerating PO crackers and fluids.   VSS, tachycardia resolved. Blood pressure improved. Feel she can be discharged home with follow up with her doctor for recheck tomorrow in office.    Charlann Lange, PA-C 10/10/14 2156  Sharlett Iles, MD 10/11/14 [phone]

## 2014-10-10 NOTE — ED Notes (Addendum)
Pt reports she recently finished abx for UTI. abd pain, diarrhea and vomiting x2 today. Reports chronic issues with these symptoms, but the severity increased today. Pain 10/10. Vomited 13x since last night. Denies blood in stool and urine. Blood on toilet paper when she wipes. Diarrhea x30 today.   Pt was treated for cdiff in early April, reports diarrhea does not spell like cdiff this time.

## 2014-10-16 ENCOUNTER — Emergency Department (HOSPITAL_COMMUNITY)
Admission: EM | Admit: 2014-10-16 | Discharge: 2014-10-16 | Disposition: A | Discharge status: Home / Self Care | Payer: Medicaid Other | Attending: Emergency Medicine | Admitting: Emergency Medicine

## 2014-10-16 DIAGNOSIS — Z91040 Latex allergy status: Secondary | ICD-10-CM | POA: Insufficient documentation

## 2014-10-16 DIAGNOSIS — Z862 Personal history of diseases of the blood and blood-forming organs and certain disorders involving the immune mechanism: Secondary | ICD-10-CM | POA: Diagnosis not present

## 2014-10-16 DIAGNOSIS — Z794 Long term (current) use of insulin: Secondary | ICD-10-CM | POA: Insufficient documentation

## 2014-10-16 DIAGNOSIS — Z87891 Personal history of nicotine dependence: Secondary | ICD-10-CM | POA: Diagnosis not present

## 2014-10-16 DIAGNOSIS — E1065 Type 1 diabetes mellitus with hyperglycemia: Secondary | ICD-10-CM | POA: Diagnosis not present

## 2014-10-16 DIAGNOSIS — R112 Nausea with vomiting, unspecified: Secondary | ICD-10-CM | POA: Diagnosis not present

## 2014-10-16 DIAGNOSIS — R1084 Generalized abdominal pain: Secondary | ICD-10-CM | POA: Insufficient documentation

## 2014-10-16 DIAGNOSIS — Z8619 Personal history of other infectious and parasitic diseases: Secondary | ICD-10-CM | POA: Diagnosis not present

## 2014-10-16 DIAGNOSIS — F419 Anxiety disorder, unspecified: Secondary | ICD-10-CM | POA: Diagnosis not present

## 2014-10-16 DIAGNOSIS — K219 Gastro-esophageal reflux disease without esophagitis: Secondary | ICD-10-CM | POA: Diagnosis not present

## 2014-10-16 DIAGNOSIS — R197 Diarrhea, unspecified: Secondary | ICD-10-CM | POA: Diagnosis not present

## 2014-10-16 DIAGNOSIS — R109 Unspecified abdominal pain: Secondary | ICD-10-CM | POA: Diagnosis present

## 2014-10-16 LAB — COMPREHENSIVE METABOLIC PANEL
ALT: 26 U/L (ref 14–54)
AST: 23 U/L (ref 15–41)
Albumin: 4.5 g/dL (ref 3.5–5.0)
Alkaline Phosphatase: 56 U/L (ref 38–126)
Anion gap: 8 (ref 5–15)
BUN: 10 mg/dL (ref 6–20)
CO2: 21 mmol/L — ABNORMAL LOW (ref 22–32)
Calcium: 8.9 mg/dL (ref 8.9–10.3)
Chloride: 98 mmol/L — ABNORMAL LOW (ref 101–111)
Creatinine, Ser: 0.63 mg/dL (ref 0.44–1.00)
GFR calc Af Amer: >60 mL/min (ref >60)
GFR calc non Af Amer: >60 mL/min (ref >60)
Glucose, Bld: 384 mg/dL — ABNORMAL HIGH (ref 65–99)
Potassium: 4.6 mmol/L (ref 3.5–5.1)
Sodium: 127 mmol/L — ABNORMAL LOW (ref 135–145)
Total Bilirubin: 0.4 mg/dL (ref 0.3–1.2)
Total Protein: 7.5 g/dL (ref 6.5–8.1)

## 2014-10-16 LAB — URINALYSIS, ROUTINE W REFLEX MICROSCOPIC
Bilirubin Urine: NEGATIVE
Glucose, UA: >1000 mg/dL — AB
Hgb urine dipstick: NEGATIVE
Ketones, ur: NEGATIVE mg/dL
Leukocytes, UA: NEGATIVE
Nitrite: NEGATIVE
Protein, ur: NEGATIVE mg/dL
Specific Gravity, Urine: 1.037 — ABNORMAL HIGH (ref 1.005–1.030)
Urobilinogen, UA: 0.2 mg/dL (ref 0.0–1.0)
pH: 6 (ref 5.0–8.0)

## 2014-10-16 LAB — CBC WITH DIFFERENTIAL/PLATELET
Basophils Absolute: 0 10*3/uL (ref 0.0–0.1)
Basophils Relative: 0 % (ref 0–1)
Eosinophils Absolute: 0.1 10*3/uL (ref 0.0–0.7)
Eosinophils Relative: 1 % (ref 0–5)
HCT: 34.5 % — ABNORMAL LOW (ref 36.0–46.0)
Hemoglobin: 11.6 g/dL — ABNORMAL LOW (ref 12.0–15.0)
Lymphocytes Relative: 37 % (ref 12–46)
Lymphs Abs: 2.3 10*3/uL (ref 0.7–4.0)
MCH: 29.6 pg (ref 26.0–34.0)
MCHC: 33.6 g/dL (ref 30.0–36.0)
MCV: 88 fL (ref 78.0–100.0)
Monocytes Absolute: 0.3 10*3/uL (ref 0.1–1.0)
Monocytes Relative: 4 % (ref 3–12)
Neutro Abs: 3.6 10*3/uL (ref 1.7–7.7)
Neutrophils Relative %: 58 % (ref 43–77)
Platelets: 260 10*3/uL (ref 150–400)
RBC: 3.92 MIL/uL (ref 3.87–5.11)
RDW: 12.8 % (ref 11.5–15.5)
WBC: 6.3 10*3/uL (ref 4.0–10.5)

## 2014-10-16 LAB — BASIC METABOLIC PANEL
Anion gap: 7 (ref 5–15)
BUN: 11 mg/dL (ref 6–20)
CO2: 26 mmol/L (ref 22–32)
Calcium: 8.9 mg/dL (ref 8.9–10.3)
Chloride: 96 mmol/L — ABNORMAL LOW (ref 101–111)
Creatinine, Ser: 0.66 mg/dL (ref 0.44–1.00)
GFR calc Af Amer: >60 mL/min (ref >60)
GFR calc non Af Amer: >60 mL/min (ref >60)
Glucose, Bld: 356 mg/dL — ABNORMAL HIGH (ref 65–99)
Potassium: 4.1 mmol/L (ref 3.5–5.1)
Sodium: 129 mmol/L — ABNORMAL LOW (ref 135–145)

## 2014-10-16 LAB — URINE MICROSCOPIC-ADD ON

## 2014-10-16 LAB — BLOOD GAS, VENOUS
Acid-base deficit: 4 mmol/L — ABNORMAL HIGH (ref 0.0–2.0)
BICARBONATE: 21.1 meq/L (ref 20.0–24.0)
O2 Saturation: 78.1 %
PATIENT TEMPERATURE: 98.6
PCO2 VEN: 40.5 mmHg — AB (ref 45.0–50.0)
PO2 VEN: 43.7 mmHg (ref 30.0–45.0)
TCO2: 18.9 mmol/L (ref 0–100)
pH, Ven: 7.336 — ABNORMAL HIGH (ref 7.250–7.300)

## 2014-10-16 LAB — CBC
HCT: 33.9 % — ABNORMAL LOW (ref 36.0–46.0)
Hemoglobin: 11.6 g/dL — ABNORMAL LOW (ref 12.0–15.0)
MCH: 30.3 pg (ref 26.0–34.0)
MCHC: 34.2 g/dL (ref 30.0–36.0)
MCV: 88.5 fL (ref 78.0–100.0)
Platelets: 276 10*3/uL (ref 150–400)
RBC: 3.83 MIL/uL — ABNORMAL LOW (ref 3.87–5.11)
RDW: 12.9 % (ref 11.5–15.5)
WBC: 5.9 10*3/uL (ref 4.0–10.5)

## 2014-10-16 LAB — CBG MONITORING, ED
Glucose-Capillary: 337 mg/dL — ABNORMAL HIGH (ref 65–99)
Glucose-Capillary: 442 mg/dL — ABNORMAL HIGH (ref 65–99)
Glucose-Capillary: 270 mg/dL — ABNORMAL HIGH (ref 65–99)

## 2014-10-16 MED ORDER — SODIUM CHLORIDE 0.9 % IV BOLUS (SEPSIS)
1000.0000 mL | Freq: Once | INTRAVENOUS | Status: AC
  Administered 2014-10-16: 1000 mL via INTRAVENOUS

## 2014-10-16 MED ORDER — HYDROMORPHONE HCL 1 MG/ML IJ SOLN
1.0000 mg | Freq: Once | INTRAMUSCULAR | Status: AC
  Administered 2014-10-16: 1 mg via INTRAVENOUS
  Filled 2014-10-16: qty 1

## 2014-10-16 MED ORDER — ONDANSETRON HCL 4 MG/2ML IJ SOLN
4.0000 mg | Freq: Once | INTRAMUSCULAR | Status: AC
  Administered 2014-10-16: 4 mg via INTRAVENOUS
  Filled 2014-10-16: qty 2

## 2014-10-16 MED ORDER — PROMETHAZINE HCL 25 MG/ML IJ SOLN
12.5000 mg | Freq: Once | INTRAMUSCULAR | Status: AC
  Administered 2014-10-16: 12.5 mg via INTRAVENOUS
  Filled 2014-10-16: qty 1

## 2014-10-16 NOTE — ED Provider Notes (Signed)
CSN: [id number]     Arrival date & time 10/16/14  1335 History   First MD Initiated Contact with Patient 10/16/14 1646     Chief Complaint  Patient presents with  . Diarrhea  . Hyperglycemia  . Abdominal Pain     (Consider location/radiation/quality/duration/timing/severity/associated sxs/prior Treatment) HPI Comments: Patient with a history of brittle T1DM, gastroparesis, chronic abdominal pain returns to ED with more intense chronic symptoms. History of DKA. She reports copious diarrhea over the last 3 days along with persistent nausea and vomiting. No known fever. She has follow up arranged at [facility] scheduled for next week. Has been out of her creon and feels this is contributing to symptoms.   Patient is a 34 y.o. female presenting with diarrhea, hyperglycemia, and abdominal pain. The history is provided by the patient. No language interpreter was used.  Diarrhea Number of episodes:  >20 daily Duration:  4 days Associated symptoms: abdominal pain and vomiting   Associated symptoms: no chills, no fever and no myalgias   Hyperglycemia Associated symptoms: abdominal pain, nausea, vomiting and weakness   Associated symptoms: no chest pain, no dysuria, no fever and no shortness of breath   Abdominal Pain Associated symptoms: diarrhea, nausea and vomiting   Associated symptoms: no chest pain, no chills, no dysuria, no fever and no shortness of breath     Past Medical History  Diagnosis Date  . DM type 1 causing complication dx 7824    hyperglycemia +/- DKA + coma, severe hypoglycemia, gastroparesis, peripheral neuropathy  . Thyroid disease     hypothyroidism associated with pregnancy  . Back pain   . PONV (postoperative nausea and vomiting)   . Anxiety     hx panic attacks.   Marland Kitchen GERD (gastroesophageal reflux disease)   . DM gastroparesis   . Anemia     .Autoimmune hemolytic anemia. bone marrow bx 2008, Dr Marin Olp  . Colitis, Clostridium difficile 05/2014  . Fatty liver 01/2007     with hepatomegaly on CT scan.    Past Surgical History  Procedure Laterality Date  . Laparoscopy  02/2002, 06/2010    laparoscopy with lysis pelvic adhesions for pelvic pain  . Back surgery      age 62  . Tooth extraction    . Root canal  10/10/12  . Flexible sigmoidoscopy N/A 06/24/2014    Procedure: FLEXIBLE SIGMOIDOSCOPY;  Surgeon: Milus Banister, MD;  Location: [facility];  Service: Endoscopy;  Laterality: N/A;  . Esophagogastroduodenoscopy (egd) with propofol N/A 06/27/2014    Procedure: ESOPHAGOGASTRODUODENOSCOPY (EGD) WITH PROPOFOL;  Surgeon: Milus Banister, MD;  Location: [facility];  Service: Endoscopy;  Laterality: N/A;   Family History  Problem Relation Age of Onset  . Diabetes Maternal Grandmother   . Cancer Maternal Grandmother     Leukemia  . Cancer Maternal Grandfather     Small cell lung cancer   Social History  Substance Use Topics  . Smoking status: Former Smoker    Quit date: 04/19/2000  . Smokeless tobacco: Former Systems developer  . Alcohol Use: No     Comment: RARE SIPS OF WINE   OB History    No data available     Review of Systems  Constitutional: Negative for fever and chills.  HENT: Negative.   Respiratory: Negative.  Negative for shortness of breath.   Cardiovascular: Negative.  Negative for chest pain.  Gastrointestinal: Positive for nausea, vomiting, abdominal pain and diarrhea.  Genitourinary: Negative.  Negative for dysuria.  Musculoskeletal: Negative.  Negative for myalgias.  Skin: Negative.  Negative for rash.  Neurological: Positive for weakness. Negative for syncope.      Allergies  Scallops; Morphine and related; Latex; and Other  Home Medications   Prior to Admission medications   Medication Sig Start Date End Date Taking? Authorizing Provider  acetaminophen (TYLENOL) 500 MG tablet Take 1,000 mg by mouth every 6 (six) hours as needed for moderate pain.     Historical Provider, MD  ALPRAZolam Duanne Moron) 1 MG tablet Take 1 mg by mouth at  bedtime as needed for anxiety.     Historical Provider, MD  amitriptyline (ELAVIL) 50 MG tablet Take 2 tablets (100 mg total) by mouth at bedtime. 08/07/14   Arnoldo Morale, MD  CALCIUM PO Take 2 tablets by mouth daily.    Historical Provider, MD  cephALEXin (KEFLEX) 250 MG capsule Take 1 capsule (250 mg total) by mouth 4 (four) times daily. Patient not taking: Reported on 10/10/2014 09/28/14   Jeannett Senior, PA-C  cholestyramine Lucrezia Starch) 4 G packet Take 1 packet (4 g total) by mouth 2 (two) times daily. Patient taking differently: Take 4 g by mouth 3 (three) times daily.  09/19/14   Okey Regal, PA-C  CREON 24000 UNITS CPEP takes 3 capsules with every meal and 2 capsule with every snack 08/09/14   Historical Provider, MD  cyanocobalamin 500 MCG tablet Take 500 mcg by mouth 2 (two) times daily.    Historical Provider, MD  cyclobenzaprine (FLEXERIL) 10 MG tablet Take 10 mg by mouth 3 (three) times daily as needed (mouth pain).    Historical Provider, MD  dicyclomine (BENTYL) 10 MG capsule Take 10 mg by mouth 4 (four) times daily -  before meals and at bedtime.    Historical Provider, MD  feeding supplement, GLUCERNA SHAKE, (GLUCERNA SHAKE) LIQD Take 237 mLs by mouth 3 (three) times daily between meals. Patient taking differently: Take 237 mLs by mouth daily.  07/06/14   Thurnell Lose, MD  gabapentin (NEURONTIN) 100 MG capsule Take 300 mg by mouth at bedtime.     Historical Provider, MD  glucagon (GLUCAGON EMERGENCY) 1 MG injection Inject 1 mg into the muscle once as needed (severe hypoglycemia). 08/14/14   Arnoldo Morale, MD  Glucosamine Sulfate 500 MG TABS Take 1 tablet by mouth daily at 12 noon.     Historical Provider, MD  insulin aspart (NOVOLOG) 100 UNIT/ML injection Inject 0-5 Units into the skin 3 (three) times daily with meals. 08/01/14   Silver Huguenin Elgergawy, MD  insulin glargine (LANTUS) 100 UNIT/ML injection Inject 0.2 mLs (20 Units total) into the skin every morning. Patient taking  differently: Inject 7 Units into the skin 2 (two) times daily. 7 units in the morning and 7 units at night 08/07/14   Arnoldo Morale, MD  loperamide (IMODIUM) 2 MG capsule Take 1 capsule (2 mg total) by mouth 4 (four) times daily as needed for diarrhea or loose stools. 09/19/14   Okey Regal, PA-C  Multiple Vitamin (MULTIVITAMIN WITH MINERALS) TABS Take 1 tablet by mouth every morning. Diabetic support vitamin    Historical Provider, MD  omeprazole (PRILOSEC) 40 MG capsule Take by mouth.    Historical Provider, MD  ondansetron (ZOFRAN-ODT) 4 MG disintegrating tablet 4 mg every 8 hours as needed for nausea or vomiting. 07/06/14   Historical Provider, MD  promethazine (PHENERGAN) 25 MG suppository Place 1 suppository (25 mg total) rectally every 6 (six) hours as needed for nausea or vomiting. 10/10/14   Nehemiah Settle  Nidhi Jacome, PA-C  saccharomyces boulardii (FLORASTOR) 250 MG capsule Take 1 capsule (250 mg total) by mouth 2 (two) times daily. 07/06/14   Thurnell Lose, MD  traZODone (DESYREL) 50 MG tablet Take 1 tablet (50 mg total) by mouth at bedtime as needed for sleep. Patient taking differently: Take 50 mg by mouth at bedtime.  09/12/14   Arnoldo Morale, MD   BP 101/70 mmHg  Pulse 100  Temp(Src) 98.5 F (36.9 C) (Oral)  Resp 18  SpO2 99%  LMP 02/18/2014 Physical Exam  Constitutional: She is oriented to person, place, and time. She appears well-developed and well-nourished.  HENT:  Head: Normocephalic.  Mouth/Throat: Mucous membranes are dry.  Neck: Normal range of motion. Neck supple.  Cardiovascular: Normal rate and regular rhythm.   Pulmonary/Chest: Effort normal and breath sounds normal.  Abdominal: Soft. Bowel sounds are normal. There is tenderness. There is no rebound and no guarding.  Non-distended, generally significantly tender.   Musculoskeletal: Normal range of motion.  Neurological: She is alert and oriented to person, place, and time.  Skin: Skin is warm and dry. No rash noted.   Psychiatric: She has a normal mood and affect.    ED Course  Procedures (including critical care time) Labs Review Labs Reviewed  BASIC METABOLIC PANEL - Abnormal; Notable for the following:    Sodium 129 (*)    Chloride 96 (*)    Glucose, Bld 356 (*)    All other components within normal limits  CBC - Abnormal; Notable for the following:    RBC 3.83 (*)    Hemoglobin 11.6 (*)    HCT 33.9 (*)    All other components within normal limits  CBG MONITORING, ED - Abnormal; Notable for the following:    Glucose-Capillary 337 (*)    All other components within normal limits  URINALYSIS, ROUTINE W REFLEX MICROSCOPIC (NOT AT [facility]  BLOOD GAS, VENOUS  CBC WITH DIFFERENTIAL/PLATELET  COMPREHENSIVE METABOLIC PANEL   Results for orders placed or performed during the hospital encounter of 54/65/68  Basic metabolic panel  Result Value Ref Range   Sodium 129 (L) 135 - 145 mmol/L   Potassium 4.1 3.5 - 5.1 mmol/L   Chloride 96 (L) 101 - 111 mmol/L   CO2 26 22 - 32 mmol/L   Glucose, Bld 356 (H) 65 - 99 mg/dL   BUN 11 6 - 20 mg/dL   Creatinine, Ser 0.66 0.44 - 1.00 mg/dL   Calcium 8.9 8.9 - 10.3 mg/dL   GFR calc non Af Amer >60 >60 mL/min   GFR calc Af Amer >60 >60 mL/min   Anion gap 7 5 - 15  CBC  Result Value Ref Range   WBC 5.9 4.0 - 10.5 K/uL   RBC 3.83 (L) 3.87 - 5.11 MIL/uL   Hemoglobin 11.6 (L) 12.0 - 15.0 g/dL   HCT 33.9 (L) 36.0 - 46.0 %   MCV 88.5 78.0 - 100.0 fL   MCH 30.3 26.0 - 34.0 pg   MCHC 34.2 30.0 - 36.0 g/dL   RDW 12.9 11.5 - 15.5 %   Platelets 276 150 - 400 K/uL  Urinalysis, Routine w reflex microscopic (not at [facility])  Result Value Ref Range   Color, Urine YELLOW YELLOW   APPearance CLEAR CLEAR   Specific Gravity, Urine 1.037 (H) 1.005 - 1.030   pH 6.0 5.0 - 8.0   Glucose, UA >1000 (A) NEGATIVE mg/dL   Hgb urine dipstick NEGATIVE NEGATIVE   Bilirubin Urine NEGATIVE NEGATIVE  Ketones, ur NEGATIVE NEGATIVE mg/dL   Protein, ur NEGATIVE NEGATIVE mg/dL    Urobilinogen, UA 0.2 0.0 - 1.0 mg/dL   Nitrite NEGATIVE NEGATIVE   Leukocytes, UA NEGATIVE NEGATIVE  Blood gas, venous  Result Value Ref Range   pH, Ven 7.336 (H) 7.250 - 7.300   pCO2, Ven 40.5 (L) 45.0 - 50.0 mmHg   pO2, Ven 43.7 30.0 - 45.0 mmHg   Bicarbonate 21.1 20.0 - 24.0 mEq/L   TCO2 18.9 0 - 100 mmol/L   Acid-base deficit 4.0 (H) 0.0 - 2.0 mmol/L   O2 Saturation 78.1 %   Patient temperature 98.6    Collection site VEIN    Drawn by COLLECTED BY LABORATORY    Sample type VENOUS   Comprehensive metabolic panel  Result Value Ref Range   Sodium 127 (L) 135 - 145 mmol/L   Potassium 4.6 3.5 - 5.1 mmol/L   Chloride 98 (L) 101 - 111 mmol/L   CO2 21 (L) 22 - 32 mmol/L   Glucose, Bld 384 (H) 65 - 99 mg/dL   BUN 10 6 - 20 mg/dL   Creatinine, Ser 0.63 0.44 - 1.00 mg/dL   Calcium 8.9 8.9 - 10.3 mg/dL   Total Protein 7.5 6.5 - 8.1 g/dL   Albumin 4.5 3.5 - 5.0 g/dL   AST 23 15 - 41 U/L   ALT 26 14 - 54 U/L   Alkaline Phosphatase 56 38 - 126 U/L   Total Bilirubin 0.4 0.3 - 1.2 mg/dL   GFR calc non Af Amer >60 >60 mL/min   GFR calc Af Amer >60 >60 mL/min   Anion gap 8 5 - 15  CBC with Differential/Platelet  Result Value Ref Range   WBC 6.3 4.0 - 10.5 K/uL   RBC 3.92 3.87 - 5.11 MIL/uL   Hemoglobin 11.6 (L) 12.0 - 15.0 g/dL   HCT 34.5 (L) 36.0 - 46.0 %   MCV 88.0 78.0 - 100.0 fL   MCH 29.6 26.0 - 34.0 pg   MCHC 33.6 30.0 - 36.0 g/dL   RDW 12.8 11.5 - 15.5 %   Platelets 260 150 - 400 K/uL   Neutrophils Relative % 58 43 - 77 %   Neutro Abs 3.6 1.7 - 7.7 K/uL   Lymphocytes Relative 37 12 - 46 %   Lymphs Abs 2.3 0.7 - 4.0 K/uL   Monocytes Relative 4 3 - 12 %   Monocytes Absolute 0.3 0.1 - 1.0 K/uL   Eosinophils Relative 1 0 - 5 %   Eosinophils Absolute 0.1 0.0 - 0.7 K/uL   Basophils Relative 0 0 - 1 %   Basophils Absolute 0.0 0.0 - 0.1 K/uL  Urine microscopic-add on  Result Value Ref Range   Squamous Epithelial / LPF RARE RARE   WBC, UA 3-6 <3 WBC/hpf  CBG monitoring, ED   Result Value Ref Range   Glucose-Capillary 337 (H) 65 - 99 mg/dL  CBG monitoring, ED  Result Value Ref Range   Glucose-Capillary 442 (H) 65 - 99 mg/dL  CBG monitoring, ED  Result Value Ref Range   Glucose-Capillary 270 (H) 65 - 99 mg/dL   .  Imaging Review No results found. I have personally reviewed and evaluated these images and lab results as part of my medical decision-making.   EKG Interpretation None      MDM   Final diagnoses:  None    1. Hyperglycemia without acidosis 2. Abdominal pain 3. Diarrhea   Patient is Type 1 DM,  brittle, with consistently high CBG, currently without evidence of DKA. Will provide fluids and pain control and reassess.  No further vomiting in ED. Per nursing she has not gone to the bathroom for diarrhea. Labs are stable with persistent hyperglycemia. No evidence acidosis. She has follow up with [facility] next week. Stable for discharge home.   Charlann Lange, PA-C 10/17/14 1157  Virgel Manifold, MD 10/17/14 [phone]

## 2014-10-16 NOTE — ED Notes (Signed)
Pt with hx of DM type 1. Pt states she has had abdominal pain and constant diarrhea since last night. Pt she last went through DKA a month ago. Pt reports her CBG was 382.

## 2014-10-16 NOTE — Discharge Instructions (Signed)

## 2014-10-21 ENCOUNTER — Telehealth: Payer: Self-pay

## 2014-10-21 NOTE — Telephone Encounter (Signed)
Called the patient to check on her status and to inquire if she needs to schedule a follow up medical appointment. She has been in the ED 10/02/14, 10/10/14 and 10/16/14.  She said that she does not need an appointment at [facility] as she is following up with Dr [name], her PCP.

## 2014-11-01 ENCOUNTER — Ambulatory Visit: Payer: Self-pay | Admitting: Internal Medicine

## 2014-11-04 ENCOUNTER — Telehealth: Payer: Self-pay | Admitting: Gastroenterology

## 2014-11-05 NOTE — Telephone Encounter (Signed)
Dr Ardis Hughs, this pt was seen in the hospital in May for diarrhea, and pancreatic insuff.  She has since been seen at [facility] for very brittle diabetes, she saw a nutritionist who recommends TPN and she wants you to order and follow her.  The endocrinologist has very specific instructions for her diet.  Should she consult with endocrinology at [facility] for the TPN ?  She also recently had an EUS at [facility]  Per your EGD in May this is a Deatra Ina patient but she is requesting to see you. Please advise.  Care everywhere has the [facility] notes.

## 2014-11-05 NOTE — Telephone Encounter (Signed)
She is Dr. Kelby Fam patient and I am not able to take over her care.   I will send this note to Dr. Deatra Ina as well

## 2014-11-05 NOTE — Telephone Encounter (Signed)
Patient advised. She needs help with the referral back to [facility]ocr[facility]ology

## 2014-11-05 NOTE — Telephone Encounter (Signed)
No answer. No voicemail. 

## 2014-11-05 NOTE — Telephone Encounter (Signed)
Dr Deatra Ina please review.

## 2014-11-05 NOTE — Telephone Encounter (Signed)
Beth see note below

## 2014-11-05 NOTE — Telephone Encounter (Signed)
I will refer her back to the nutritionist at [facility] and allow them to coordinate with pharmacy for TPN.  Otherwise she will have to be seen locally with nutrition to coordinate TPN

## 2014-11-06 NOTE — Telephone Encounter (Signed)
Spoke with [facility] endocrinology triage nurse. No record of TPN in patient's record. She does have an appointment with Endocrinology 11/12/14. Spoke with the patient and reminded her of the appointment. She is advised to direct her questions to provider at the appointment.

## 2014-11-08 ENCOUNTER — Emergency Department (HOSPITAL_COMMUNITY)
Admission: EM | Admit: 2014-11-08 | Discharge: 2014-11-08 | Disposition: A | Discharge status: Home / Self Care | Payer: Medicaid Other | Attending: Emergency Medicine | Admitting: Emergency Medicine

## 2014-11-08 ENCOUNTER — Encounter (HOSPITAL_COMMUNITY): Payer: Self-pay | Admitting: *Deleted

## 2014-11-08 DIAGNOSIS — R109 Unspecified abdominal pain: Secondary | ICD-10-CM | POA: Diagnosis present

## 2014-11-08 DIAGNOSIS — E1065 Type 1 diabetes mellitus with hyperglycemia: Secondary | ICD-10-CM | POA: Insufficient documentation

## 2014-11-08 DIAGNOSIS — Z91040 Latex allergy status: Secondary | ICD-10-CM | POA: Insufficient documentation

## 2014-11-08 DIAGNOSIS — Z794 Long term (current) use of insulin: Secondary | ICD-10-CM | POA: Diagnosis not present

## 2014-11-08 DIAGNOSIS — R112 Nausea with vomiting, unspecified: Secondary | ICD-10-CM | POA: Insufficient documentation

## 2014-11-08 DIAGNOSIS — Z87891 Personal history of nicotine dependence: Secondary | ICD-10-CM | POA: Diagnosis not present

## 2014-11-08 DIAGNOSIS — E079 Disorder of thyroid, unspecified: Secondary | ICD-10-CM | POA: Diagnosis not present

## 2014-11-08 DIAGNOSIS — F419 Anxiety disorder, unspecified: Secondary | ICD-10-CM | POA: Diagnosis not present

## 2014-11-08 DIAGNOSIS — Z8619 Personal history of other infectious and parasitic diseases: Secondary | ICD-10-CM | POA: Insufficient documentation

## 2014-11-08 DIAGNOSIS — R739 Hyperglycemia, unspecified: Secondary | ICD-10-CM

## 2014-11-08 DIAGNOSIS — R1084 Generalized abdominal pain: Secondary | ICD-10-CM | POA: Diagnosis not present

## 2014-11-08 DIAGNOSIS — R197 Diarrhea, unspecified: Secondary | ICD-10-CM | POA: Insufficient documentation

## 2014-11-08 DIAGNOSIS — Z79899 Other long term (current) drug therapy: Secondary | ICD-10-CM | POA: Insufficient documentation

## 2014-11-08 DIAGNOSIS — Z862 Personal history of diseases of the blood and blood-forming organs and certain disorders involving the immune mechanism: Secondary | ICD-10-CM | POA: Diagnosis not present

## 2014-11-08 DIAGNOSIS — K219 Gastro-esophageal reflux disease without esophagitis: Secondary | ICD-10-CM | POA: Diagnosis not present

## 2014-11-08 LAB — COMPREHENSIVE METABOLIC PANEL
ALBUMIN: 4.4 g/dL (ref 3.5–5.0)
ALK PHOS: 56 U/L (ref 38–126)
ALT: 38 U/L (ref 14–54)
AST: 44 U/L — ABNORMAL HIGH (ref 15–41)
Anion gap: 9 (ref 5–15)
BILIRUBIN TOTAL: 0.5 mg/dL (ref 0.3–1.2)
BUN: 12 mg/dL (ref 6–20)
CALCIUM: 8.8 mg/dL — AB (ref 8.9–10.3)
CO2: 23 mmol/L (ref 22–32)
CREATININE: 0.66 mg/dL (ref 0.44–1.00)
Chloride: 99 mmol/L — ABNORMAL LOW (ref 101–111)
GFR calc non Af Amer: >60 mL/min (ref >60)
GLUCOSE: 334 mg/dL — AB (ref 65–99)
Potassium: 3.5 mmol/L (ref 3.5–5.1)
SODIUM: 131 mmol/L — AB (ref 135–145)
TOTAL PROTEIN: 7.4 g/dL (ref 6.5–8.1)

## 2014-11-08 LAB — CBC
HCT: 35.8 % — ABNORMAL LOW (ref 36.0–46.0)
Hemoglobin: 12.6 g/dL (ref 12.0–15.0)
MCH: 30.5 pg (ref 26.0–34.0)
MCHC: 35.2 g/dL (ref 30.0–36.0)
MCV: 86.7 fL (ref 78.0–100.0)
PLATELETS: 201 10*3/uL (ref 150–400)
RBC: 4.13 MIL/uL (ref 3.87–5.11)
RDW: 12.4 % (ref 11.5–15.5)
WBC: 6 10*3/uL (ref 4.0–10.5)

## 2014-11-08 LAB — URINE MICROSCOPIC-ADD ON

## 2014-11-08 LAB — C DIFFICILE QUICK SCREEN W PCR REFLEX
C DIFFICILE (CDIFF) INTERP: NEGATIVE
C DIFFICILE (CDIFF) TOXIN: NEGATIVE
C DIFFICLE (CDIFF) ANTIGEN: NEGATIVE

## 2014-11-08 LAB — LIPASE, BLOOD: Lipase: 12 U/L — ABNORMAL LOW (ref 22–51)

## 2014-11-08 LAB — URINALYSIS, ROUTINE W REFLEX MICROSCOPIC
BILIRUBIN URINE: NEGATIVE
HGB URINE DIPSTICK: NEGATIVE
KETONES UR: NEGATIVE mg/dL
Leukocytes, UA: NEGATIVE
Nitrite: NEGATIVE
PROTEIN: NEGATIVE mg/dL
Specific Gravity, Urine: 1.044 — ABNORMAL HIGH (ref 1.005–1.030)
UROBILINOGEN UA: 0.2 mg/dL (ref 0.0–1.0)
pH: 5.5 (ref 5.0–8.0)

## 2014-11-08 LAB — CBG MONITORING, ED
GLUCOSE-CAPILLARY: 278 mg/dL — AB (ref 65–99)
Glucose-Capillary: 325 mg/dL — ABNORMAL HIGH (ref 65–99)
Glucose-Capillary: 388 mg/dL — ABNORMAL HIGH (ref 65–99)

## 2014-11-08 MED ORDER — DIPHENOXYLATE-ATROPINE 2.5-0.025 MG PO TABS: 1.0000 | ORAL_TABLET | Freq: Four times a day (QID) | ORAL | Status: DC | PRN

## 2014-11-08 MED ORDER — PROMETHAZINE HCL 25 MG RE SUPP: 25.0000 mg | Freq: Four times a day (QID) | RECTAL | Status: DC | PRN

## 2014-11-08 MED ORDER — PROMETHAZINE HCL 25 MG PO TABS: 25.0000 mg | ORAL_TABLET | Freq: Four times a day (QID) | ORAL | Status: DC | PRN

## 2014-11-08 MED ORDER — HYDROMORPHONE HCL 1 MG/ML IJ SOLN
1.0000 mg | Freq: Once | INTRAMUSCULAR | Status: AC
  Administered 2014-11-08: 1 mg via INTRAVENOUS
  Filled 2014-11-08: qty 1

## 2014-11-08 MED ORDER — PROMETHAZINE HCL 25 MG/ML IJ SOLN
25.0000 mg | Freq: Once | INTRAMUSCULAR | Status: AC
  Administered 2014-11-08: 25 mg via INTRAVENOUS
  Filled 2014-11-08: qty 1

## 2014-11-08 MED ORDER — INSULIN ASPART 100 UNIT/ML ~~LOC~~ SOLN
10.0000 [IU] | Freq: Once | SUBCUTANEOUS | Status: DC
  Filled 2014-11-08: qty 1

## 2014-11-08 MED ORDER — SODIUM CHLORIDE 0.9 % IV BOLUS (SEPSIS)
1000.0000 mL | Freq: Once | INTRAVENOUS | Status: AC
  Administered 2014-11-08: 1000 mL via INTRAVENOUS

## 2014-11-08 NOTE — ED Notes (Signed)
Pt called out for pain medication. Informed patient there was no current order for pain medication and I would ask the EDP about same. Pt verbalizes understanding of same.

## 2014-11-08 NOTE — ED Provider Notes (Addendum)
CSN: [id number]     Arrival date & time 11/08/14  1437 History   First MD Initiated Contact with Patient 11/08/14 1645     Chief Complaint  Patient presents with  . Diarrhea  . Abdominal Pain     (Consider location/radiation/quality/duration/timing/severity/associated sxs/prior Treatment) HPI Comments: Presents to the ER for evaluation of nausea, vomiting and diarrhea. Patient reports that she has not felt well for several days, over the last 2 or 3 days she has developed nausea and vomiting with frequent diarrhea. She is reporting severe abdominal cramping associated with symptoms. Patient reports that she does have a history of C. difficile. She was treated a month ago for UTI as well. Patient is a type I diabetic. She has been experiencing elevated blood sugars with this illness.  Patient is a 34 y.o. female presenting with diarrhea and abdominal pain.  Diarrhea Associated symptoms: abdominal pain and vomiting   Abdominal Pain Associated symptoms: diarrhea, nausea and vomiting     Past Medical History  Diagnosis Date  . DM type 1 causing complication dx 6712    hyperglycemia +/- DKA + coma, severe hypoglycemia, gastroparesis, peripheral neuropathy  . Thyroid disease     hypothyroidism associated with pregnancy  . Back pain   . PONV (postoperative nausea and vomiting)   . Anxiety     hx panic attacks.   Marland Kitchen GERD (gastroesophageal reflux disease)   . DM gastroparesis   . Anemia     .Autoimmune hemolytic anemia. bone marrow bx 2008, Dr Marin Olp  . Colitis, Clostridium difficile 05/2014  . Fatty liver 01/2007    with hepatomegaly on CT scan.    Past Surgical History  Procedure Laterality Date  . Laparoscopy  02/2002, 06/2010    laparoscopy with lysis pelvic adhesions for pelvic pain  . Back surgery      age 65  . Tooth extraction    . Root canal  10/10/12  . Flexible sigmoidoscopy N/A 06/24/2014    Procedure: FLEXIBLE SIGMOIDOSCOPY;  Surgeon: Milus Banister, MD;  Location: [facility]  ENDOSCOPY;  Service: Endoscopy;  Laterality: N/A;  . Esophagogastroduodenoscopy (egd) with propofol N/A 06/27/2014    Procedure: ESOPHAGOGASTRODUODENOSCOPY (EGD) WITH PROPOFOL;  Surgeon: Milus Banister, MD;  Location: [facility] ENDOSCOPY;  Service: Endoscopy;  Laterality: N/A;   Family History  Problem Relation Age of Onset  . Diabetes Maternal Grandmother   . Cancer Maternal Grandmother     Leukemia  . Cancer Maternal Grandfather     Small cell lung cancer   Social History  Substance Use Topics  . Smoking status: Former Smoker    Quit date: 04/19/2000  . Smokeless tobacco: Former Systems developer  . Alcohol Use: No     Comment: RARE SIPS OF WINE   OB History    No data available     Review of Systems  Gastrointestinal: Positive for nausea, vomiting, abdominal pain and diarrhea.  All other systems reviewed and are negative.     Allergies  Scallops; Morphine and related; Latex; and Other  Home Medications   Prior to Admission medications   Medication Sig Start Date End Date Taking? Authorizing Provider  acetaminophen (TYLENOL) 500 MG tablet Take 1,000 mg by mouth every 6 (six) hours as needed for moderate pain.    Yes Historical Provider, MD  ALPRAZolam Duanne Moron) 1 MG tablet Take 1 mg by mouth at bedtime as needed for anxiety.    Yes Historical Provider, MD  amitriptyline (ELAVIL) 50 MG tablet Take 2 tablets (100  mg total) by mouth at bedtime. 08/07/14  Yes Arnoldo Morale, MD  CALCIUM PO Take 2 tablets by mouth daily.   Yes Historical Provider, MD  cholestyramine Lucrezia Starch) 4 G packet Take 1 packet (4 g total) by mouth 2 (two) times daily. Patient taking differently: Take 4 g by mouth 4 (four) times daily.  09/19/14  Yes Okey Regal, PA-C  CREON 24000 UNITS CPEP take 4 capsules with every meal and 3 capsules with snacks 08/09/14  Yes Historical Provider, MD  cyanocobalamin 500 MCG tablet Take 2,000 mcg by mouth 2 (two) times daily as needed (mouth sores).    Yes Historical Provider, MD   cyclobenzaprine (FLEXERIL) 10 MG tablet Take 10 mg by mouth 3 (three) times daily as needed (jaw pain).    Yes Historical Provider, MD  dicyclomine (BENTYL) 10 MG capsule Take 10 mg by mouth 4 (four) times daily -  before meals and at bedtime.   Yes Historical Provider, MD  feeding supplement, GLUCERNA SHAKE, (GLUCERNA SHAKE) LIQD Take 237 mLs by mouth 3 (three) times daily between meals. Patient taking differently: Take 237 mLs by mouth daily.  07/06/14  Yes Thurnell Lose, MD  gabapentin (NEURONTIN) 100 MG capsule Take 300 mg by mouth at bedtime.    Yes Historical Provider, MD  glucagon (GLUCAGON EMERGENCY) 1 MG injection Inject 1 mg into the muscle once as needed (severe hypoglycemia). 08/14/14  Yes Arnoldo Morale, MD  insulin aspart (NOVOLOG) 100 UNIT/ML injection Inject 0-5 Units into the skin 3 (three) times daily with meals. 08/01/14  Yes Silver Huguenin Elgergawy, MD  insulin glargine (LANTUS) 100 UNIT/ML injection Inject 0.2 mLs (20 Units total) into the skin every morning. Patient taking differently: Inject 7 Units into the skin 2 (two) times daily. 7 units in the morning and 7 units at night 08/07/14  Yes Arnoldo Morale, MD  loperamide (IMODIUM) 1 MG/5ML solution Take 2 mg by mouth as needed for diarrhea or loose stools.   Yes Historical Provider, MD  Multiple Vitamin (MULTIVITAMIN WITH MINERALS) TABS Take 1 tablet by mouth every morning. Diabetic support vitamin   Yes Historical Provider, MD  omeprazole (PRILOSEC) 40 MG capsule Take 40 mg by mouth daily.    Yes Historical Provider, MD  ondansetron (ZOFRAN-ODT) 4 MG disintegrating tablet 4 mg every 8 hours as needed for nausea or vomiting. 07/06/14  Yes Historical Provider, MD  oxyCODONE-acetaminophen (PERCOCET) 10-325 MG per tablet Take 0.5 tablets by mouth every 4 (four) hours as needed for pain.   Yes Historical Provider, MD  Probiotic Product (DIGESTIVE ADVANTAGE GUMMIES PO) Take 2 tablets by mouth 2 (two) times daily.   Yes Historical Provider, MD   promethazine (PHENERGAN) 25 MG suppository Place 1 suppository (25 mg total) rectally every 6 (six) hours as needed for nausea or vomiting. 10/10/14  Yes Charlann Lange, PA-C  traZODone (DESYREL) 50 MG tablet Take 1 tablet (50 mg total) by mouth at bedtime as needed for sleep. 09/12/14  Yes Arnoldo Morale, MD  cephALEXin (KEFLEX) 250 MG capsule Take 1 capsule (250 mg total) by mouth 4 (four) times daily. Patient not taking: Reported on 10/10/2014 09/28/14   Tatyana Kirichenko, PA-C  saccharomyces boulardii (FLORASTOR) 250 MG capsule Take 1 capsule (250 mg total) by mouth 2 (two) times daily. 07/06/14   Thurnell Lose, MD   BP 109/80 mmHg  Pulse 94  Temp(Src) 98.1 F (36.7 C) (Oral)  Resp 18  Ht 5\' 5"  (1.651 m)  Wt 89 lb 1 oz (40.398  kg)  BMI 14.82 kg/m2  SpO2 100% Physical Exam  Constitutional: She is oriented to person, place, and time. She appears well-developed and well-nourished. No distress.  HENT:  Head: Normocephalic and atraumatic.  Right Ear: Hearing normal.  Left Ear: Hearing normal.  Nose: Nose normal.  Mouth/Throat: Oropharynx is clear and moist and mucous membranes are normal.  Eyes: Conjunctivae and EOM are normal. Pupils are equal, round, and reactive to light.  Neck: Normal range of motion. Neck supple.  Cardiovascular: Regular rhythm, S1 normal and S2 normal.  Exam reveals no gallop and no friction rub.   No murmur heard. Pulmonary/Chest: Effort normal and breath sounds normal. No respiratory distress. She exhibits no tenderness.  Abdominal: Soft. Normal appearance and bowel sounds are normal. There is no hepatosplenomegaly. There is generalized tenderness. There is no rebound, no guarding, no tenderness at McBurney's point and negative Murphy's sign. No hernia.  Musculoskeletal: Normal range of motion.  Neurological: She is alert and oriented to person, place, and time. She has normal strength. No cranial nerve deficit or sensory deficit. Coordination normal. GCS eye  subscore is 4. GCS verbal subscore is 5. GCS motor subscore is 6.  Skin: Skin is warm, dry and intact. No rash noted. No cyanosis.  Psychiatric: She has a normal mood and affect. Her speech is normal and behavior is normal. Thought content normal.  Nursing note and vitals reviewed.   ED Course  Procedures (including critical care time) Labs Review Labs Reviewed  LIPASE, BLOOD - Abnormal; Notable for the following:    Lipase 12 (*)    All other components within normal limits  COMPREHENSIVE METABOLIC PANEL - Abnormal; Notable for the following:    Sodium 131 (*)    Chloride 99 (*)    Glucose, Bld 334 (*)    Calcium 8.8 (*)    AST 44 (*)    All other components within normal limits  CBC - Abnormal; Notable for the following:    HCT 35.8 (*)    All other components within normal limits  URINALYSIS, ROUTINE W REFLEX MICROSCOPIC (NOT AT [facility]) - Abnormal; Notable for the following:    APPearance CLOUDY (*)    Specific Gravity, Urine 1.044 (*)    Glucose, UA >1000 (*)    All other components within normal limits  URINE MICROSCOPIC-ADD ON - Abnormal; Notable for the following:    Bacteria, UA FEW (*)    All other components within normal limits  CBG MONITORING, ED - Abnormal; Notable for the following:    Glucose-Capillary 325 (*)    All other components within normal limits  CBG MONITORING, ED - Abnormal; Notable for the following:    Glucose-Capillary 388 (*)    All other components within normal limits  CBG MONITORING, ED - Abnormal; Notable for the following:    Glucose-Capillary 278 (*)    All other components within normal limits  C DIFFICILE QUICK SCREEN W PCR REFLEX  POC URINE PREG, ED    Imaging Review No results found. I have personally reviewed and evaluated these images and lab results as part of my medical decision-making.   EKG Interpretation None      MDM   Final diagnoses:  None   hyperglycemia  Vomiting  Diarrhea  Presents to the ER for  evaluation of nausea, vomiting and diarrhea. She is complaining of abdominal pain. The symptoms seem to be somewhat chronic for this patient. She has, however, recently been treated for C. difficile colitis. Her  workup was fairly unremarkable. She did have mild hyperglycemia without evidence of DKA. Remainder of her labs were within normal limits. Urinalysis equivocal, will obtain culture. C. difficile was negative. Patient to minister to IV fluids, analgesia. She'll be discharged with symptomatic treatment for vomiting and diarrhea.    Orpah Greek, MD 11/08/14 3154  Orpah Greek, MD 11/08/14 2021

## 2014-11-08 NOTE — ED Notes (Signed)
Pt reports abd pain with n/v/d x 3 days.  Pt also reports elevated CBG.

## 2014-11-08 NOTE — Discharge Instructions (Signed)

## 2014-11-08 NOTE — ED Notes (Signed)
EDP made aware pt would like pain medication. No new orders.

## 2014-11-11 LAB — URINE CULTURE

## 2014-11-13 ENCOUNTER — Other Ambulatory Visit: Payer: Self-pay | Admitting: Family Medicine

## 2014-11-18 ENCOUNTER — Emergency Department (HOSPITAL_COMMUNITY)
Admission: EM | Admit: 2014-11-18 | Discharge: 2014-11-18 | Disposition: A | Discharge status: Home / Self Care | Payer: Medicaid Other | Attending: Emergency Medicine | Admitting: Emergency Medicine

## 2014-11-18 ENCOUNTER — Encounter (HOSPITAL_COMMUNITY): Payer: Self-pay | Admitting: Emergency Medicine

## 2014-11-18 DIAGNOSIS — Z79899 Other long term (current) drug therapy: Secondary | ICD-10-CM | POA: Insufficient documentation

## 2014-11-18 DIAGNOSIS — F419 Anxiety disorder, unspecified: Secondary | ICD-10-CM | POA: Insufficient documentation

## 2014-11-18 DIAGNOSIS — D649 Anemia, unspecified: Secondary | ICD-10-CM | POA: Insufficient documentation

## 2014-11-18 DIAGNOSIS — E109 Type 1 diabetes mellitus without complications: Secondary | ICD-10-CM | POA: Insufficient documentation

## 2014-11-18 DIAGNOSIS — E876 Hypokalemia: Secondary | ICD-10-CM | POA: Insufficient documentation

## 2014-11-18 DIAGNOSIS — R109 Unspecified abdominal pain: Secondary | ICD-10-CM

## 2014-11-18 DIAGNOSIS — K219 Gastro-esophageal reflux disease without esophagitis: Secondary | ICD-10-CM | POA: Diagnosis not present

## 2014-11-18 DIAGNOSIS — G8929 Other chronic pain: Secondary | ICD-10-CM | POA: Diagnosis not present

## 2014-11-18 DIAGNOSIS — R1084 Generalized abdominal pain: Secondary | ICD-10-CM | POA: Insufficient documentation

## 2014-11-18 DIAGNOSIS — Z794 Long term (current) use of insulin: Secondary | ICD-10-CM | POA: Insufficient documentation

## 2014-11-18 LAB — COMPREHENSIVE METABOLIC PANEL
ALT: 40 U/L (ref 14–54)
AST: 43 U/L — ABNORMAL HIGH (ref 15–41)
Albumin: 4.6 g/dL (ref 3.5–5.0)
Alkaline Phosphatase: 69 U/L (ref 38–126)
Anion gap: 11 (ref 5–15)
BUN: 15 mg/dL (ref 6–20)
CO2: 26 mmol/L (ref 22–32)
Calcium: 9.2 mg/dL (ref 8.9–10.3)
Chloride: 94 mmol/L — ABNORMAL LOW (ref 101–111)
Creatinine, Ser: 0.89 mg/dL (ref 0.44–1.00)
GFR calc Af Amer: >60 mL/min (ref >60)
GFR calc non Af Amer: >60 mL/min (ref >60)
Glucose, Bld: 361 mg/dL — ABNORMAL HIGH (ref 65–99)
Potassium: 2.6 mmol/L — CL (ref 3.5–5.1)
Sodium: 131 mmol/L — ABNORMAL LOW (ref 135–145)
Total Bilirubin: 0.4 mg/dL (ref 0.3–1.2)
Total Protein: 7.9 g/dL (ref 6.5–8.1)

## 2014-11-18 LAB — CBC
HCT: 39.7 % (ref 36.0–46.0)
Hemoglobin: 13.6 g/dL (ref 12.0–15.0)
MCH: 29.9 pg (ref 26.0–34.0)
MCHC: 34.3 g/dL (ref 30.0–36.0)
MCV: 87.3 fL (ref 78.0–100.0)
Platelets: 354 10*3/uL (ref 150–400)
RBC: 4.55 MIL/uL (ref 3.87–5.11)
RDW: 12.6 % (ref 11.5–15.5)
WBC: 6.7 10*3/uL (ref 4.0–10.5)

## 2014-11-18 LAB — URINALYSIS, ROUTINE W REFLEX MICROSCOPIC
Bilirubin Urine: NEGATIVE
Glucose, UA: >1000 mg/dL — AB
Hgb urine dipstick: NEGATIVE
Ketones, ur: NEGATIVE mg/dL
Leukocytes, UA: NEGATIVE
Nitrite: NEGATIVE
Protein, ur: NEGATIVE mg/dL
Specific Gravity, Urine: 1.039 — ABNORMAL HIGH (ref 1.005–1.030)
Urobilinogen, UA: 0.2 mg/dL (ref 0.0–1.0)
pH: 5.5 (ref 5.0–8.0)

## 2014-11-18 LAB — CBG MONITORING, ED
GLUCOSE-CAPILLARY: 211 mg/dL — AB (ref 65–99)
Glucose-Capillary: 352 mg/dL — ABNORMAL HIGH (ref 65–99)

## 2014-11-18 LAB — LIPASE, BLOOD: Lipase: <10 U/L — ABNORMAL LOW (ref 22–51)

## 2014-11-18 LAB — URINE MICROSCOPIC-ADD ON

## 2014-11-18 MED ORDER — LACTINEX PO PACK: PACK | ORAL | Status: DC

## 2014-11-18 MED ORDER — ONDANSETRON HCL 4 MG PO TABS: 4.0000 mg | ORAL_TABLET | Freq: Four times a day (QID) | ORAL | Status: DC

## 2014-11-18 MED ORDER — POTASSIUM CHLORIDE 10 MEQ/100ML IV SOLN
10.0000 meq | Freq: Once | INTRAVENOUS | Status: AC
  Administered 2014-11-18: 10 meq via INTRAVENOUS
  Filled 2014-11-18: qty 100

## 2014-11-18 MED ORDER — KETOROLAC TROMETHAMINE 30 MG/ML IJ SOLN
30.0000 mg | Freq: Once | INTRAMUSCULAR | Status: AC
  Administered 2014-11-18: 30 mg via INTRAVENOUS
  Filled 2014-11-18: qty 1

## 2014-11-18 MED ORDER — OXYCODONE-ACETAMINOPHEN 5-325 MG PO TABS
1.0000 | ORAL_TABLET | Freq: Once | ORAL | Status: AC
  Administered 2014-11-18: 1 via ORAL
  Filled 2014-11-18: qty 1

## 2014-11-18 MED ORDER — HALOPERIDOL LACTATE 5 MG/ML IJ SOLN
2.0000 mg | Freq: Once | INTRAMUSCULAR | Status: AC
  Administered 2014-11-18: 2 mg via INTRAVENOUS
  Filled 2014-11-18: qty 1

## 2014-11-18 MED ORDER — PROMETHAZINE HCL 25 MG RE SUPP: 25.0000 mg | Freq: Four times a day (QID) | RECTAL | Status: DC | PRN

## 2014-11-18 MED ORDER — POTASSIUM CHLORIDE CRYS ER 20 MEQ PO TBCR
40.0000 meq | EXTENDED_RELEASE_TABLET | Freq: Once | ORAL | Status: AC
  Administered 2014-11-18: 40 meq via ORAL
  Filled 2014-11-18: qty 2

## 2014-11-18 MED ORDER — SODIUM CHLORIDE 0.9 % IV BOLUS (SEPSIS)
1000.0000 mL | Freq: Once | INTRAVENOUS | Status: AC
  Administered 2014-11-18: 1000 mL via INTRAVENOUS

## 2014-11-18 MED ORDER — DICYCLOMINE HCL 10 MG/ML IM SOLN
20.0000 mg | Freq: Once | INTRAMUSCULAR | Status: AC
  Administered 2014-11-18: 20 mg via INTRAMUSCULAR
  Filled 2014-11-18: qty 2

## 2014-11-18 MED ORDER — DIPHENHYDRAMINE HCL 25 MG PO CAPS
25.0000 mg | ORAL_CAPSULE | Freq: Once | ORAL | Status: AC
  Administered 2014-11-18: 25 mg via ORAL
  Filled 2014-11-18: qty 1

## 2014-11-18 MED ORDER — OXYCODONE HCL 5 MG PO TABS
5.0000 mg | ORAL_TABLET | Freq: Once | ORAL | Status: AC
  Administered 2014-11-18: 5 mg via ORAL
  Filled 2014-11-18: qty 1

## 2014-11-18 NOTE — ED Provider Notes (Signed)
CSN: [id number]     Arrival date & time 11/18/14  1319 History   First MD Initiated Contact with Patient 11/18/14 1559     Chief Complaint  Patient presents with  . Abdominal Pain  . Nausea    (Consider location/radiation/quality/duration/timing/severity/associated sxs/prior Treatment) HPI Comments: 34 year old female with a history of diabetes mellitus, anxiety, esophageal reflux, gastroparesis, C. difficile colitis, and fatty liver presents to the emergency department for abdominal pain. Patient states that her pain is diffuse, stabbing, constant, and waxing and waning in severity. She states that pain worsened acutely is past evening. She has a history of similar abdominal pain and states that today's pain feels consistent with her known chronic pain. She reports 5 episodes of nonbloody emesis since onset of symptoms worsening. Last episode of emesis was at 10 AM today. Patient states that she has had 30 episodes of watery diarrhea. She denies melena and hematochezia. She has had constant diarrhea since December, per patient. Patient had a negative C. difficile test a few weeks ago. She has been taking Phenergan and oxycodone without relief of her abdominal pain or vomiting. Abdominal surgical hx significant for laparoscopy at 34 y/o. Patient denies associated fevers.   Patient followed by GI at [facility] PCP - Dr. Sheryle Hail  Patient is a 34 y.o. female presenting with abdominal pain. The history is provided by the patient. No language interpreter was used.  Abdominal Pain Associated symptoms: diarrhea and vomiting   Associated symptoms: no chest pain, no dysuria, no fever and no shortness of breath     Past Medical History  Diagnosis Date  . DM type 1 causing complication dx 0109    hyperglycemia +/- DKA + coma, severe hypoglycemia, gastroparesis, peripheral neuropathy  . Thyroid disease     hypothyroidism associated with pregnancy  . Back pain   . PONV (postoperative nausea and vomiting)    . Anxiety     hx panic attacks.   Marland Kitchen GERD (gastroesophageal reflux disease)   . DM gastroparesis   . Anemia     .Autoimmune hemolytic anemia. bone marrow bx 2008, Dr Marin Olp  . Colitis, Clostridium difficile 05/2014  . Fatty liver 01/2007    with hepatomegaly on CT scan.    Past Surgical History  Procedure Laterality Date  . Laparoscopy  02/2002, 06/2010    laparoscopy with lysis pelvic adhesions for pelvic pain  . Back surgery      age 73  . Tooth extraction    . Root canal  10/10/12  . Flexible sigmoidoscopy N/A 06/24/2014    Procedure: FLEXIBLE SIGMOIDOSCOPY;  Surgeon: Milus Banister, MD;  Location: [facility];  Service: Endoscopy;  Laterality: N/A;  . Esophagogastroduodenoscopy (egd) with propofol N/A 06/27/2014    Procedure: ESOPHAGOGASTRODUODENOSCOPY (EGD) WITH PROPOFOL;  Surgeon: Milus Banister, MD;  Location: [facility];  Service: Endoscopy;  Laterality: N/A;   Family History  Problem Relation Age of Onset  . Diabetes Maternal Grandmother   . Cancer Maternal Grandmother     Leukemia  . Cancer Maternal Grandfather     Small cell lung cancer   Social History  Substance Use Topics  . Smoking status: Former Smoker    Quit date: 04/19/2000  . Smokeless tobacco: Former Systems developer  . Alcohol Use: No     Comment: RARE SIPS OF WINE   OB History    No data available      Review of Systems  Constitutional: Negative for fever.  Respiratory: Negative for shortness of breath.  Cardiovascular: Negative for chest pain.  Gastrointestinal: Positive for vomiting, abdominal pain and diarrhea. Negative for blood in stool.  Genitourinary: Negative for dysuria.  All other systems reviewed and are negative.   Allergies  Scallops; Morphine and related; Latex; and Other  Home Medications   Prior to Admission medications   Medication Sig Start Date End Date Taking? Authorizing Provider  acetaminophen (TYLENOL) 500 MG tablet Take 1,000 mg by mouth every 6 (six) hours as needed for  moderate pain.    Yes Historical Provider, MD  ALPRAZolam Duanne Moron) 1 MG tablet Take 1 mg by mouth at bedtime as needed for anxiety.    Yes Historical Provider, MD  amitriptyline (ELAVIL) 50 MG tablet Take 2 tablets (100 mg total) by mouth at bedtime. 08/07/14  Yes Arnoldo Morale, MD  CALCIUM PO Take 2 tablets by mouth daily.   Yes Historical Provider, MD  cholestyramine Lucrezia Starch) 4 G packet Take 1 packet (4 g total) by mouth 2 (two) times daily. Patient taking differently: Take 4 g by mouth 4 (four) times daily.  09/19/14  Yes Okey Regal, PA-C  CREON 24000 UNITS CPEP take 4 capsules with every meal and 3 capsules with snacks 08/09/14  Yes Historical Provider, MD  cyanocobalamin 500 MCG tablet Take 2,000 mcg by mouth 2 (two) times daily as needed (mouth sores).    Yes Historical Provider, MD  dicyclomine (BENTYL) 10 MG capsule Take 10 mg by mouth 4 (four) times daily -  before meals and at bedtime.   Yes Historical Provider, MD  diphenoxylate-atropine (LOMOTIL) 2.5-0.025 MG per tablet Take 1 tablet by mouth 4 (four) times daily as needed for diarrhea or loose stools. 11/08/14  Yes Orpah Greek, MD  feeding supplement, GLUCERNA SHAKE, (GLUCERNA SHAKE) LIQD Take 237 mLs by mouth 3 (three) times daily between meals. Patient taking differently: Take 237 mLs by mouth daily.  07/06/14  Yes Thurnell Lose, MD  gabapentin (NEURONTIN) 100 MG capsule Take 300 mg by mouth at bedtime.    Yes Historical Provider, MD  glucagon (GLUCAGON EMERGENCY) 1 MG injection Inject 1 mg into the muscle once as needed (severe hypoglycemia). 08/14/14  Yes Arnoldo Morale, MD  insulin aspart (NOVOLOG) 100 UNIT/ML injection Inject 0-5 Units into the skin 3 (three) times daily with meals. 08/01/14  Yes Silver Huguenin Elgergawy, MD  insulin glargine (LANTUS) 100 UNIT/ML injection Inject 0.2 mLs (20 Units total) into the skin every morning. Patient taking differently: Inject 7 Units into the skin 2 (two) times daily. 7 units in the  morning and 7 units at night 08/07/14  Yes Arnoldo Morale, MD  loperamide (IMODIUM) 1 MG/5ML solution Take 2 mg by mouth as needed for diarrhea or loose stools.   Yes Historical Provider, MD  Multiple Vitamin (MULTIVITAMIN WITH MINERALS) TABS Take 1 tablet by mouth every morning. Diabetic support vitamin   Yes Historical Provider, MD  omeprazole (PRILOSEC) 40 MG capsule Take 40 mg by mouth daily.    Yes Historical Provider, MD  oxyCODONE-acetaminophen (PERCOCET) 10-325 MG per tablet Take 0.5 tablets by mouth every 4 (four) hours as needed for pain.   Yes Historical Provider, MD  Probiotic Product (DIGESTIVE ADVANTAGE GUMMIES PO) Take 2 tablets by mouth 2 (two) times daily.   Yes Historical Provider, MD  promethazine (PHENERGAN) 25 MG tablet Take 1 tablet (25 mg total) by mouth every 6 (six) hours as needed for nausea or vomiting. 11/08/14  Yes Orpah Greek, MD  saccharomyces boulardii (FLORASTOR) 250 MG capsule  Take 1 capsule (250 mg total) by mouth 2 (two) times daily. 07/06/14  Yes Thurnell Lose, MD  traZODone (DESYREL) 50 MG tablet Take 1 tablet (50 mg total) by mouth at bedtime as needed for sleep. 09/12/14  Yes Arnoldo Morale, MD  HUMALOG 100 UNIT/ML cartridge Inject 5-15 Units as directed 3 (three) times daily. 11/12/14   Historical Provider, MD  Lactobacillus (LACTINEX) PACK Mix 1/2 packet with soft food; take twice a day for 5 days 11/18/14   Antonietta Breach, PA-C  ondansetron (ZOFRAN) 4 MG tablet Take 1 tablet (4 mg total) by mouth every 6 (six) hours. 11/18/14   Antonietta Breach, PA-C  promethazine (PHENERGAN) 25 MG suppository Place 1 suppository (25 mg total) rectally every 6 (six) hours as needed for nausea or vomiting. 11/18/14   Antonietta Breach, PA-C   BP 100/64 mmHg  Pulse 84  Temp(Src) 98.3 F (36.8 C) (Oral)  Resp 14  Ht 5\' 5"  (1.651 m)  Wt 89 lb (40.37 kg)  BMI 14.81 kg/m2  SpO2 99%   Physical Exam  Constitutional: She is oriented to person, place, and time. She appears well-developed  and well-nourished. No distress.  Chronically thin appearing  HENT:  Head: Normocephalic and atraumatic.  Mouth/Throat: Oropharynx is clear and moist.  Eyes: Conjunctivae and EOM are normal. No scleral icterus.  Neck: Normal range of motion.  Cardiovascular: Normal rate, regular rhythm and intact distal pulses.   Pulmonary/Chest: Effort normal and breath sounds normal. No respiratory distress. She has no wheezes. She has no rales.  Lungs clear bilaterally.  Abdominal: Soft. She exhibits no distension. There is tenderness. There is no rebound.  Mild diffuse TTP. No focal tenderness. No masses or involuntary guarding; no peritoneal signs.  Musculoskeletal: Normal range of motion.  Neurological: She is alert and oriented to person, place, and time. She exhibits normal muscle tone. Coordination normal.  GCS 15. Patient moving all extremities.  Skin: Skin is warm and dry. No rash noted. She is not diaphoretic. No erythema. No pallor.  Psychiatric: She has a normal mood and affect. Her behavior is normal.  Nursing note and vitals reviewed.   ED Course  Procedures (including critical care time) Labs Review Labs Reviewed  LIPASE, BLOOD - Abnormal; Notable for the following:    Lipase <10 (*)    All other components within normal limits  COMPREHENSIVE METABOLIC PANEL - Abnormal; Notable for the following:    Sodium 131 (*)    Potassium 2.6 (*)    Chloride 94 (*)    Glucose, Bld 361 (*)    AST 43 (*)    All other components within normal limits  URINALYSIS, ROUTINE W REFLEX MICROSCOPIC (NOT AT [facility]) - Abnormal; Notable for the following:    Specific Gravity, Urine 1.039 (*)    Glucose, UA >1000 (*)    All other components within normal limits  URINE MICROSCOPIC-ADD ON - Abnormal; Notable for the following:    Bacteria, UA FEW (*)    All other components within normal limits  CBG MONITORING, ED - Abnormal; Notable for the following:    Glucose-Capillary 211 (*)    All other components  within normal limits  CBG MONITORING, ED - Abnormal; Notable for the following:    Glucose-Capillary 352 (*)    All other components within normal limits  C DIFFICILE QUICK SCREEN W PCR REFLEX  CBC    Imaging Review No results found.   I have personally reviewed and evaluated these images and lab  results as part of my medical decision-making.   EKG Interpretation   Date/Time:  Monday November 18 2014 16:04:16 EDT Ventricular Rate:  91 PR Interval:  123 QRS Duration: 79 QT Interval:  371 QTC Calculation: 456 R Axis:   83 Text Interpretation:  Sinus rhythm Anteroseptal infarct, old Nonspecific T  abnormalities, lateral leads Confirmed by Wilson Singer  MD, STEPHEN ([id number]) on  11/18/2014 4:25:30 PM      1935 - Patient c/o "skin crawling" from haldol. Benadryl given. Patient has been unable to provide stool sample since C diff ordered at 1714. She has tolerated all oral liquids and PO medications without emesis. Patient to receive her home dose of oxycodone as she is requesting additional pain medication. Will NOT give IV narcotics. Plan to d/c with GI follow up. MDM   Final diagnoses:  Chronic abdominal pain  Hypokalemia    34 year old female presents to the emergency department for evaluation of abdominal pain. Patient has been seen multiple times in the emergency department for abdominal pain complaints. She has a history of 17 visits in the last 6 months. Patient is afebrile and hemodynamically stable today. She reports greater than 30 episodes of diarrhea; however, she has not had any stooling since stool sample was ordered at 1715. Patient also reporting multiple episodes of emesis. She has not had any emesis in the emergency department over a 3 hour observation. She has been tolerating oral medications and fluids without difficulty.  Patient did have hypokalemia today of 2.6. She was given IV as well as oral potassium for repletion. IV fluids also given. Laboratory workup is  noncontributory. Abdominal exam notable for diffuse tenderness which is mild. No focal tenderness peritoneal signs. This remains stable on reexamination. Symptoms today are likely secondary to patient's known history of chronic abdominal pain. She has been treated with Haldol, Toradol, Bentyl, and fluids. Patient requesting additional pain control. Will prescribe her oral home oxycodone tablet. I have discussed that further tx with opioids is not indicated for gastroparesis or chronic abdominal pain. Patient advised to follow-up with her gastroenterologist as well as her primary care doctor. Return precautions discussed and provided. Patient discharged in good condition with no unaddressed concerns.   Filed Vitals:   11/18/14 1327 11/18/14 1329 11/18/14 1749 11/18/14 2001  BP: 97/69  96/64 100/64  Pulse: 93  96 84  Temp: 97.5 F (36.4 C)  98.3 F (36.8 C)   TempSrc: Oral  Oral   Resp: 18  20 14   Height:   5\' 5"  (1.651 m)   Weight:  89 lb 6.4 oz (40.552 kg) 89 lb (40.37 kg)   SpO2: 100%  97% 99%     Antonietta Breach, PA-C 11/18/14 2105  Virgel Manifold, MD 11/20/14 1536

## 2014-11-18 NOTE — Discharge Instructions (Signed)

## 2014-11-18 NOTE — ED Notes (Signed)
Patient was alert, oriented and stable upon discharge. RN went over AVS and patient had no further questions.  

## 2014-11-18 NOTE — ED Notes (Signed)
Patient states she feels like her skin is crawling. RN will notify provider.

## 2014-11-18 NOTE — ED Notes (Signed)
Pt c/o chronic diffuse abdominal pain x several months, worsened last night, diarrhea and nausea. Pt seen in ED multiple times for the same.

## 2014-11-18 NOTE — ED Notes (Signed)
Bed: WA18 Expected date:  Expected time:  Means of arrival:  Comments: Res a 

## 2014-11-18 NOTE — ED Notes (Signed)
I attempt to collect labs and was unsuccessful. 

## 2014-11-28 ENCOUNTER — Encounter (HOSPITAL_COMMUNITY): Payer: Self-pay | Admitting: Emergency Medicine

## 2014-11-28 ENCOUNTER — Emergency Department (HOSPITAL_COMMUNITY)
Admission: EM | Admit: 2014-11-28 | Discharge: 2014-11-28 | Disposition: A | Discharge status: Home / Self Care | Payer: Medicaid Other | Attending: Emergency Medicine | Admitting: Emergency Medicine

## 2014-11-28 DIAGNOSIS — E1065 Type 1 diabetes mellitus with hyperglycemia: Secondary | ICD-10-CM | POA: Diagnosis not present

## 2014-11-28 DIAGNOSIS — N390 Urinary tract infection, site not specified: Secondary | ICD-10-CM | POA: Insufficient documentation

## 2014-11-28 DIAGNOSIS — Z91040 Latex allergy status: Secondary | ICD-10-CM | POA: Diagnosis not present

## 2014-11-28 DIAGNOSIS — Z862 Personal history of diseases of the blood and blood-forming organs and certain disorders involving the immune mechanism: Secondary | ICD-10-CM | POA: Diagnosis not present

## 2014-11-28 DIAGNOSIS — J029 Acute pharyngitis, unspecified: Secondary | ICD-10-CM | POA: Diagnosis present

## 2014-11-28 DIAGNOSIS — Z79899 Other long term (current) drug therapy: Secondary | ICD-10-CM | POA: Insufficient documentation

## 2014-11-28 DIAGNOSIS — Z3202 Encounter for pregnancy test, result negative: Secondary | ICD-10-CM | POA: Diagnosis not present

## 2014-11-28 DIAGNOSIS — Z87891 Personal history of nicotine dependence: Secondary | ICD-10-CM | POA: Diagnosis not present

## 2014-11-28 DIAGNOSIS — F419 Anxiety disorder, unspecified: Secondary | ICD-10-CM | POA: Insufficient documentation

## 2014-11-28 DIAGNOSIS — R197 Diarrhea, unspecified: Secondary | ICD-10-CM | POA: Insufficient documentation

## 2014-11-28 DIAGNOSIS — Z8619 Personal history of other infectious and parasitic diseases: Secondary | ICD-10-CM | POA: Insufficient documentation

## 2014-11-28 DIAGNOSIS — K219 Gastro-esophageal reflux disease without esophagitis: Secondary | ICD-10-CM | POA: Diagnosis not present

## 2014-11-28 DIAGNOSIS — Z794 Long term (current) use of insulin: Secondary | ICD-10-CM | POA: Diagnosis not present

## 2014-11-28 LAB — URINE MICROSCOPIC-ADD ON

## 2014-11-28 LAB — CBG MONITORING, ED
GLUCOSE-CAPILLARY: 498 mg/dL — AB (ref 65–99)
GLUCOSE-CAPILLARY: 328 mg/dL — AB (ref 65–99)
GLUCOSE-CAPILLARY: 259 mg/dL — AB (ref 65–99)

## 2014-11-28 LAB — URINALYSIS, ROUTINE W REFLEX MICROSCOPIC
BILIRUBIN URINE: NEGATIVE
HGB URINE DIPSTICK: NEGATIVE
Ketones, ur: NEGATIVE mg/dL
Leukocytes, UA: NEGATIVE
Nitrite: NEGATIVE
Protein, ur: NEGATIVE mg/dL
SPECIFIC GRAVITY, URINE: 1.039 — AB (ref 1.005–1.030)
UROBILINOGEN UA: 0.2 mg/dL (ref 0.0–1.0)
pH: 5.5 (ref 5.0–8.0)

## 2014-11-28 LAB — CBC
HEMATOCRIT: 36.6 % (ref 36.0–46.0)
Hemoglobin: 12.3 g/dL (ref 12.0–15.0)
MCH: 29.9 pg (ref 26.0–34.0)
MCHC: 33.6 g/dL (ref 30.0–36.0)
MCV: 89.1 fL (ref 78.0–100.0)
Platelets: 246 10*3/uL (ref 150–400)
RBC: 4.11 MIL/uL (ref 3.87–5.11)
RDW: 13.1 % (ref 11.5–15.5)
WBC: 5.2 10*3/uL (ref 4.0–10.5)

## 2014-11-28 LAB — BASIC METABOLIC PANEL
Anion gap: 9 (ref 5–15)
BUN: 5 mg/dL — AB (ref 6–20)
CALCIUM: 9 mg/dL (ref 8.9–10.3)
CO2: 26 mmol/L (ref 22–32)
Chloride: 98 mmol/L — ABNORMAL LOW (ref 101–111)
Creatinine, Ser: 0.75 mg/dL (ref 0.44–1.00)
GFR calc Af Amer: >60 mL/min (ref >60)
GLUCOSE: 528 mg/dL — AB (ref 65–99)
Potassium: 3.5 mmol/L (ref 3.5–5.1)
Sodium: 133 mmol/L — ABNORMAL LOW (ref 135–145)

## 2014-11-28 LAB — RAPID STREP SCREEN (MED CTR MEBANE ONLY): STREPTOCOCCUS, GROUP A SCREEN (DIRECT): NEGATIVE

## 2014-11-28 LAB — PREGNANCY, URINE: PREG TEST UR: NEGATIVE

## 2014-11-28 MED ORDER — DEXTROSE 5 % IV SOLN
1.0000 g | Freq: Once | INTRAVENOUS | Status: DC
  Filled 2014-11-28: qty 10

## 2014-11-28 MED ORDER — ONDANSETRON 4 MG PO TBDP
4.0000 mg | ORAL_TABLET | Freq: Once | ORAL | Status: AC
  Administered 2014-11-28: 4 mg via ORAL
  Filled 2014-11-28: qty 1

## 2014-11-28 MED ORDER — LEVOFLOXACIN 250 MG PO TABS: 250.0000 mg | ORAL_TABLET | Freq: Every day | ORAL | Status: DC

## 2014-11-28 MED ORDER — INSULIN ASPART 100 UNIT/ML ~~LOC~~ SOLN
10.0000 [IU] | Freq: Once | SUBCUTANEOUS | Status: DC
  Filled 2014-11-28: qty 1

## 2014-11-28 MED ORDER — HYDROMORPHONE HCL 1 MG/ML IJ SOLN
1.0000 mg | Freq: Once | INTRAMUSCULAR | Status: AC
  Administered 2014-11-28: 1 mg via INTRAMUSCULAR
  Filled 2014-11-28: qty 1

## 2014-11-28 MED ORDER — HYDROMORPHONE HCL 1 MG/ML IJ SOLN
0.5000 mg | Freq: Once | INTRAMUSCULAR | Status: AC
  Administered 2014-11-28: 0.5 mg via INTRAMUSCULAR
  Filled 2014-11-28: qty 1

## 2014-11-28 MED ORDER — HYDROCODONE-ACETAMINOPHEN 5-325 MG PO TABS: 1.0000 | ORAL_TABLET | Freq: Four times a day (QID) | ORAL | Status: DC | PRN

## 2014-11-28 MED ORDER — ONDANSETRON HCL 4 MG/2ML IJ SOLN
4.0000 mg | Freq: Once | INTRAMUSCULAR | Status: DC
  Filled 2014-11-28: qty 2

## 2014-11-28 MED ORDER — LIDOCAINE HCL 1 % IJ SOLN
INTRAMUSCULAR | Status: DC
Start: 2014-11-28 — End: 2014-11-29
  Filled 2014-11-28: qty 20

## 2014-11-28 MED ORDER — CEFTRIAXONE SODIUM 1 G IJ SOLR
1.0000 g | Freq: Once | INTRAMUSCULAR | Status: AC
  Administered 2014-11-28: 1 g via INTRAMUSCULAR
  Filled 2014-11-28: qty 10

## 2014-11-28 MED ORDER — ONDANSETRON 4 MG PO TBDP: ORAL_TABLET | ORAL | Status: DC

## 2014-11-28 MED ORDER — SODIUM CHLORIDE 0.9 % IV BOLUS (SEPSIS): 2000.0000 mL | Freq: Once | INTRAVENOUS | Status: DC

## 2014-11-28 MED ORDER — HYDROMORPHONE HCL 1 MG/ML IJ SOLN
0.5000 mg | Freq: Once | INTRAMUSCULAR | Status: DC
  Filled 2014-11-28: qty 1

## 2014-11-28 NOTE — ED Notes (Signed)
Pt states 10 units of insulin is "too much and will crash me". Dr. Roderic Palau made aware of same.

## 2014-11-28 NOTE — ED Provider Notes (Signed)
CSN: [id number]     Arrival date & time 11/28/14  1326 History   First MD Initiated Contact with Patient 11/28/14 1501     Chief Complaint  Patient presents with  . Hyperglycemia  . Sore Throat     (Consider location/radiation/quality/duration/timing/severity/associated sxs/prior Treatment) Patient is a 34 y.o. female presenting with pharyngitis. The history is provided by the patient (Patient complains of sore throat and vomiting. For 1 day.).  Sore Throat This is a new problem. The current episode started yesterday. The problem occurs constantly. The problem has not changed since onset.Pertinent negatives include no chest pain, no headaches and no shortness of breath. Nothing aggravates the symptoms. Nothing relieves the symptoms.    Past Medical History  Diagnosis Date  . DM type 1 causing complication ([place]) dx 4431    hyperglycemia +/- DKA + coma, severe hypoglycemia, gastroparesis, peripheral neuropathy  . Thyroid disease     hypothyroidism associated with pregnancy  . Back pain   . PONV (postoperative nausea and vomiting)   . Anxiety     hx panic attacks.   Marland Kitchen GERD (gastroesophageal reflux disease)   . DM gastroparesis ([place])   . Anemia     .Autoimmune hemolytic anemia. bone marrow bx 2008, Dr Marin Olp  . Colitis, Clostridium difficile 05/2014  . Fatty liver 01/2007    with hepatomegaly on CT scan.    Past Surgical History  Procedure Laterality Date  . Laparoscopy  02/2002, 06/2010    laparoscopy with lysis pelvic adhesions for pelvic pain  . Back surgery      age 59  . Tooth extraction    . Root canal  10/10/12  . Flexible sigmoidoscopy N/A 06/24/2014    Procedure: FLEXIBLE SIGMOIDOSCOPY;  Surgeon: Milus Banister, MD;  Location: [facility];  Service: Endoscopy;  Laterality: N/A;  . Esophagogastroduodenoscopy (egd) with propofol N/A 06/27/2014    Procedure: ESOPHAGOGASTRODUODENOSCOPY (EGD) WITH PROPOFOL;  Surgeon: Milus Banister, MD;  Location: [facility];  Service:  Endoscopy;  Laterality: N/A;   Family History  Problem Relation Age of Onset  . Diabetes Maternal Grandmother   . Cancer Maternal Grandmother     Leukemia  . Cancer Maternal Grandfather     Small cell lung cancer   Social History  Substance Use Topics  . Smoking status: Former Smoker    Quit date: 04/19/2000  . Smokeless tobacco: Former Systems developer  . Alcohol Use: No     Comment: RARE SIPS OF WINE   OB History    No data available     Review of Systems  Constitutional: Negative for appetite change and fatigue.  HENT: Positive for sore throat. Negative for congestion, ear discharge and sinus pressure.   Eyes: Negative for discharge.  Respiratory: Negative for cough and shortness of breath.   Cardiovascular: Negative for chest pain.  Gastrointestinal: Positive for vomiting and diarrhea.  Genitourinary: Negative for frequency and hematuria.  Musculoskeletal: Negative for back pain.  Skin: Negative for rash.  Neurological: Negative for seizures and headaches.  Psychiatric/Behavioral: Negative for hallucinations.      Allergies  Scallops; Haloperidol and related; Morphine and related; Toradol; Latex; and Other  Home Medications   Prior to Admission medications   Medication Sig Start Date End Date Taking? Authorizing Provider  acetaminophen (TYLENOL) 500 MG tablet Take 1,000 mg by mouth every 6 (six) hours as needed for moderate pain.    Yes Historical Provider, MD  ALPRAZolam Duanne Moron) 1 MG tablet Take 1 mg by mouth at  bedtime as needed for anxiety.    Yes Historical Provider, MD  amitriptyline (ELAVIL) 50 MG tablet Take 2 tablets (100 mg total) by mouth at bedtime. 08/07/14  Yes Arnoldo Morale, MD  CALCIUM PO Take 2 tablets by mouth daily.   Yes Historical Provider, MD  cholestyramine Lucrezia Starch) 4 G packet Take 1 packet (4 g total) by mouth 2 (two) times daily. Patient taking differently: Take 4 g by mouth 4 (four) times daily.  09/19/14  Yes Okey Regal, PA-C  CREON 24000 UNITS  CPEP take 4 capsules with every meal and 3 capsules with snacks 08/09/14  Yes Historical Provider, MD  cyanocobalamin 500 MCG tablet Take 2,000 mcg by mouth 2 (two) times daily as needed (mouth sores).    Yes Historical Provider, MD  dicyclomine (BENTYL) 10 MG capsule Take 10 mg by mouth 4 (four) times daily -  before meals and at bedtime.   Yes Historical Provider, MD  diphenoxylate-atropine (LOMOTIL) 2.5-0.025 MG per tablet Take 1 tablet by mouth 4 (four) times daily as needed for diarrhea or loose stools. 11/08/14  Yes Orpah Greek, MD  feeding supplement, GLUCERNA SHAKE, (GLUCERNA SHAKE) LIQD Take 237 mLs by mouth 3 (three) times daily between meals. Patient taking differently: Take 237 mLs by mouth daily.  07/06/14  Yes Thurnell Lose, MD  gabapentin (NEURONTIN) 100 MG capsule Take 300 mg by mouth at bedtime.    Yes Historical Provider, MD  glucagon (GLUCAGON EMERGENCY) 1 MG injection Inject 1 mg into the muscle once as needed (severe hypoglycemia). 08/14/14  Yes Arnoldo Morale, MD  insulin aspart (NOVOLOG) 100 UNIT/ML injection Inject 0-5 Units into the skin 3 (three) times daily with meals. Patient taking differently: Inject 0-5 Units into the skin 3 (three) times daily with meals. Sliding scale 08/01/14  Yes Albertine Patricia, MD  insulin glargine (LANTUS) 100 UNIT/ML injection Inject 0.2 mLs (20 Units total) into the skin every morning. Patient taking differently: Inject 7 Units into the skin 2 (two) times daily. 7 units in the morning and 7 units at night 08/07/14  Yes Arnoldo Morale, MD  loperamide (IMODIUM) 1 MG/5ML solution Take 2 mg by mouth as needed for diarrhea or loose stools.   Yes Historical Provider, MD  Multiple Vitamin (MULTIVITAMIN WITH MINERALS) TABS Take 1 tablet by mouth every morning. Diabetic support vitamin   Yes Historical Provider, MD  omeprazole (PRILOSEC) 40 MG capsule Take 40 mg by mouth daily.    Yes Historical Provider, MD  ondansetron (ZOFRAN) 4 MG tablet Take 1  tablet (4 mg total) by mouth every 6 (six) hours. 11/18/14  Yes Antonietta Breach, PA-C  oxyCODONE-acetaminophen (PERCOCET) 10-325 MG per tablet Take 0.5 tablets by mouth every 4 (four) hours as needed for pain.   Yes Historical Provider, MD  Potassium (POTASSIMIN PO) Take 2 capsules by mouth daily.   Yes Historical Provider, MD  Probiotic Product (DIGESTIVE ADVANTAGE GUMMIES PO) Take 2 tablets by mouth 2 (two) times daily.   Yes Historical Provider, MD  promethazine (PHENERGAN) 25 MG suppository Place 1 suppository (25 mg total) rectally every 6 (six) hours as needed for nausea or vomiting. 11/18/14  Yes Antonietta Breach, PA-C  promethazine (PHENERGAN) 25 MG tablet Take 1 tablet (25 mg total) by mouth every 6 (six) hours as needed for nausea or vomiting. 11/08/14  Yes Orpah Greek, MD  saccharomyces boulardii (FLORASTOR) 250 MG capsule Take 1 capsule (250 mg total) by mouth 2 (two) times daily. 07/06/14  Yes Prashant  Jennette Kettle, MD  HUMALOG 100 UNIT/ML cartridge Inject 5-15 Units as directed 3 (three) times daily. 11/12/14   Historical Provider, MD  HYDROcodone-acetaminophen (NORCO/VICODIN) 5-325 MG tablet Take 1 tablet by mouth every 6 (six) hours as needed for moderate pain. 11/28/14   Milton Ferguson, MD  Lactobacillus Esau Grew) PACK Mix 1/2 packet with soft food; take twice a day for 5 days Patient not taking: Reported on 11/28/2014 11/18/14   Antonietta Breach, PA-C  levofloxacin (LEVAQUIN) 250 MG tablet Take 1 tablet (250 mg total) by mouth daily. 11/28/14   Milton Ferguson, MD  ondansetron (ZOFRAN ODT) 4 MG disintegrating tablet 4mg  ODT q4 hours prn nausea/vomit 11/28/14   Milton Ferguson, MD  traZODone (DESYREL) 50 MG tablet Take 1 tablet (50 mg total) by mouth at bedtime as needed for sleep. Patient not taking: Reported on 11/28/2014 09/12/14   Arnoldo Morale, MD   BP 110/73 mmHg  Pulse 79  Temp(Src) 98.5 F (36.9 C) (Oral)  Resp 18  SpO2 100% Physical Exam  Constitutional: She is oriented to person, place, and  time. She appears well-developed.  HENT:  Head: Normocephalic.  Pharynx mildly inflamed  Eyes: Conjunctivae and EOM are normal. No scleral icterus.  Neck: Neck supple. No thyromegaly present.  Cardiovascular: Normal rate and regular rhythm.  Exam reveals no gallop and no friction rub.   No murmur heard. Pulmonary/Chest: No stridor. She has no wheezes. She has no rales. She exhibits no tenderness.  Abdominal: She exhibits no distension. There is no tenderness. There is no rebound.  Musculoskeletal: Normal range of motion. She exhibits no edema.  Lymphadenopathy:    She has no cervical adenopathy.  Neurological: She is oriented to person, place, and time. She exhibits normal muscle tone. Coordination normal.  Skin: No rash noted. No erythema.  Psychiatric: She has a normal mood and affect. Her behavior is normal.    ED Course  Procedures (including critical care time) Labs Review Labs Reviewed  BASIC METABOLIC PANEL - Abnormal; Notable for the following:    Sodium 133 (*)    Chloride 98 (*)    Glucose, Bld 528 (*)    BUN 5 (*)    All other components within normal limits  URINALYSIS, ROUTINE W REFLEX MICROSCOPIC (NOT AT [facility]) - Abnormal; Notable for the following:    Specific Gravity, Urine 1.039 (*)    Glucose, UA >1000 (*)    All other components within normal limits  URINE MICROSCOPIC-ADD ON - Abnormal; Notable for the following:    Bacteria, UA MANY (*)    All other components within normal limits  CBG MONITORING, ED - Abnormal; Notable for the following:    Glucose-Capillary 498 (*)    All other components within normal limits  CBG MONITORING, ED - Abnormal; Notable for the following:    Glucose-Capillary 328 (*)    All other components within normal limits  CBG MONITORING, ED - Abnormal; Notable for the following:    Glucose-Capillary 259 (*)    All other components within normal limits  RAPID STREP SCREEN (NOT AT [facility])  URINE CULTURE  CULTURE, GROUP A STREP  CBC   PREGNANCY, URINE    Imaging Review No results found. I have personally reviewed and evaluated these images and lab results as part of my medical decision-making.   EKG Interpretation None      MDM   Final diagnoses:  Pharyngitis  UTI (lower urinary tract infection)    Patient labs show her to have bacteria in  her urine. Possible UTI. Glucose elevated around 500. Patient not in DKA. Patient was given pain medicine and nausea medicine and was hydrated. She improved before discharge. Urine culture is been done. She is treated with Levaquin for 5 days given pain and nausea medicine and will follow up with her PCP    Milton Ferguson, MD 11/28/14 2055

## 2014-11-28 NOTE — Discharge Instructions (Signed)
Drink plenty of fluids and follow up with your md next week °

## 2014-11-28 NOTE — ED Notes (Signed)
Pt currently drinking fluids.

## 2014-11-28 NOTE — ED Notes (Signed)
Pt refused w/c, ambulatory with family, steady gait.

## 2014-11-28 NOTE — ED Notes (Signed)
Pt is a brittle diabetic. Pt states her family members have recently been diagnosed with strep throat. States she began having a sore throat yesterday, then began having abdominal pain that's been getting increasingly severe. States she began feeling a lot weaker, and felt like she may be going into DKA again.

## 2014-11-29 ENCOUNTER — Ambulatory Visit: Payer: Self-pay | Admitting: Internal Medicine

## 2014-11-29 DIAGNOSIS — Z0289 Encounter for other administrative examinations: Secondary | ICD-10-CM

## 2014-11-29 LAB — URINE CULTURE
Culture: 8000
Special Requests: NORMAL

## 2014-12-01 LAB — CULTURE, GROUP A STREP

## 2014-12-14 ENCOUNTER — Emergency Department (HOSPITAL_COMMUNITY): Payer: Medicaid Other

## 2014-12-14 ENCOUNTER — Emergency Department (HOSPITAL_COMMUNITY)
Admission: EM | Admit: 2014-12-14 | Discharge: 2014-12-14 | Disposition: A | Discharge status: Home / Self Care | Payer: Medicaid Other | Attending: Emergency Medicine | Admitting: Emergency Medicine

## 2014-12-14 ENCOUNTER — Encounter (HOSPITAL_COMMUNITY): Payer: Self-pay | Admitting: Emergency Medicine

## 2014-12-14 DIAGNOSIS — R109 Unspecified abdominal pain: Secondary | ICD-10-CM | POA: Diagnosis present

## 2014-12-14 DIAGNOSIS — Z3202 Encounter for pregnancy test, result negative: Secondary | ICD-10-CM | POA: Insufficient documentation

## 2014-12-14 DIAGNOSIS — G43A0 Cyclical vomiting, not intractable: Secondary | ICD-10-CM | POA: Insufficient documentation

## 2014-12-14 DIAGNOSIS — E1011 Type 1 diabetes mellitus with ketoacidosis with coma: Secondary | ICD-10-CM | POA: Diagnosis not present

## 2014-12-14 DIAGNOSIS — E860 Dehydration: Secondary | ICD-10-CM

## 2014-12-14 DIAGNOSIS — Z862 Personal history of diseases of the blood and blood-forming organs and certain disorders involving the immune mechanism: Secondary | ICD-10-CM | POA: Diagnosis not present

## 2014-12-14 DIAGNOSIS — Z87891 Personal history of nicotine dependence: Secondary | ICD-10-CM | POA: Insufficient documentation

## 2014-12-14 DIAGNOSIS — K219 Gastro-esophageal reflux disease without esophagitis: Secondary | ICD-10-CM | POA: Insufficient documentation

## 2014-12-14 DIAGNOSIS — Z8619 Personal history of other infectious and parasitic diseases: Secondary | ICD-10-CM | POA: Diagnosis not present

## 2014-12-14 DIAGNOSIS — Z794 Long term (current) use of insulin: Secondary | ICD-10-CM | POA: Diagnosis not present

## 2014-12-14 DIAGNOSIS — E1065 Type 1 diabetes mellitus with hyperglycemia: Secondary | ICD-10-CM | POA: Diagnosis not present

## 2014-12-14 DIAGNOSIS — R000 Tachycardia, unspecified: Secondary | ICD-10-CM | POA: Diagnosis not present

## 2014-12-14 DIAGNOSIS — Z79899 Other long term (current) drug therapy: Secondary | ICD-10-CM | POA: Diagnosis not present

## 2014-12-14 DIAGNOSIS — R739 Hyperglycemia, unspecified: Secondary | ICD-10-CM

## 2014-12-14 DIAGNOSIS — R197 Diarrhea, unspecified: Secondary | ICD-10-CM | POA: Diagnosis not present

## 2014-12-14 DIAGNOSIS — F410 Panic disorder [episodic paroxysmal anxiety] without agoraphobia: Secondary | ICD-10-CM | POA: Diagnosis not present

## 2014-12-14 DIAGNOSIS — Z91040 Latex allergy status: Secondary | ICD-10-CM | POA: Insufficient documentation

## 2014-12-14 DIAGNOSIS — R1115 Cyclical vomiting syndrome unrelated to migraine: Secondary | ICD-10-CM

## 2014-12-14 LAB — BLOOD GAS, VENOUS
ACID-BASE EXCESS: 7.7 mmol/L — AB (ref 0.0–2.0)
BICARBONATE: 32.1 meq/L — AB (ref 20.0–24.0)
O2 Saturation: 92.3 %
PATIENT TEMPERATURE: 98.6
TCO2: 28.6 mmol/L (ref 0–100)
pCO2, Ven: 45.4 mmHg (ref 45.0–50.0)
pH, Ven: 7.463 — ABNORMAL HIGH (ref 7.250–7.300)
pO2, Ven: 63 mmHg — ABNORMAL HIGH (ref 30.0–45.0)

## 2014-12-14 LAB — COMPREHENSIVE METABOLIC PANEL
ALT: 52 U/L (ref 14–54)
ANION GAP: 10 (ref 5–15)
AST: 57 U/L — ABNORMAL HIGH (ref 15–41)
Albumin: 4.3 g/dL (ref 3.5–5.0)
Alkaline Phosphatase: 69 U/L (ref 38–126)
BILIRUBIN TOTAL: 0.6 mg/dL (ref 0.3–1.2)
BUN: 22 mg/dL — ABNORMAL HIGH (ref 6–20)
CO2: 30 mmol/L (ref 22–32)
Calcium: 9.6 mg/dL (ref 8.9–10.3)
Chloride: 89 mmol/L — ABNORMAL LOW (ref 101–111)
Creatinine, Ser: 0.8 mg/dL (ref 0.44–1.00)
Glucose, Bld: 484 mg/dL — ABNORMAL HIGH (ref 65–99)
POTASSIUM: 4.3 mmol/L (ref 3.5–5.1)
Sodium: 129 mmol/L — ABNORMAL LOW (ref 135–145)
TOTAL PROTEIN: 7.5 g/dL (ref 6.5–8.1)

## 2014-12-14 LAB — C DIFFICILE QUICK SCREEN W PCR REFLEX
C Diff antigen: NEGATIVE
C Diff interpretation: NEGATIVE
C Diff toxin: NEGATIVE

## 2014-12-14 LAB — CBC WITH DIFFERENTIAL/PLATELET
BASOS PCT: 0 %
Basophils Absolute: 0 10*3/uL (ref 0.0–0.1)
Eosinophils Absolute: 0.2 10*3/uL (ref 0.0–0.7)
Eosinophils Relative: 2 %
HEMATOCRIT: 37 % (ref 36.0–46.0)
Hemoglobin: 12.6 g/dL (ref 12.0–15.0)
Lymphocytes Relative: 26 %
Lymphs Abs: 1.7 10*3/uL (ref 0.7–4.0)
MCH: 29.1 pg (ref 26.0–34.0)
MCHC: 34.1 g/dL (ref 30.0–36.0)
MCV: 85.5 fL (ref 78.0–100.0)
MONO ABS: 0.4 10*3/uL (ref 0.1–1.0)
MONOS PCT: 6 %
NEUTROS ABS: 4.2 10*3/uL (ref 1.7–7.7)
Neutrophils Relative %: 66 %
Platelets: 364 10*3/uL (ref 150–400)
RBC: 4.33 MIL/uL (ref 3.87–5.11)
RDW: 12.3 % (ref 11.5–15.5)
WBC: 6.4 10*3/uL (ref 4.0–10.5)

## 2014-12-14 LAB — I-STAT BETA HCG BLOOD, ED (MC, WL, AP ONLY)

## 2014-12-14 LAB — CBG MONITORING, ED
GLUCOSE-CAPILLARY: 421 mg/dL — AB (ref 65–99)
Glucose-Capillary: 275 mg/dL — ABNORMAL HIGH (ref 65–99)

## 2014-12-14 LAB — LIPASE, BLOOD: LIPASE: 21 U/L (ref 11–51)

## 2014-12-14 MED ORDER — SODIUM CHLORIDE 0.9 % IV BOLUS (SEPSIS)
1000.0000 mL | Freq: Once | INTRAVENOUS | Status: AC
  Administered 2014-12-14: 1000 mL via INTRAVENOUS

## 2014-12-14 MED ORDER — INSULIN ASPART 100 UNIT/ML IV SOLN
5.0000 [IU] | Freq: Once | INTRAVENOUS | Status: DC
  Filled 2014-12-14: qty 0.05

## 2014-12-14 MED ORDER — HYDROMORPHONE HCL 1 MG/ML IJ SOLN
1.0000 mg | Freq: Once | INTRAMUSCULAR | Status: AC
  Administered 2014-12-14: 1 mg via INTRAVENOUS
  Filled 2014-12-14: qty 1

## 2014-12-14 MED ORDER — PROMETHAZINE HCL 25 MG/ML IJ SOLN
25.0000 mg | Freq: Once | INTRAMUSCULAR | Status: AC
  Administered 2014-12-14: 25 mg via INTRAVENOUS
  Filled 2014-12-14: qty 1

## 2014-12-14 MED ORDER — PROMETHAZINE HCL 25 MG RE SUPP
25.0000 mg | Freq: Four times a day (QID) | RECTAL | Status: DC | PRN
Start: 2014-12-14 — End: 2014-12-19

## 2014-12-14 MED ORDER — INSULIN ASPART 100 UNIT/ML ~~LOC~~ SOLN: 1.0000 [IU] | Freq: Once | SUBCUTANEOUS | Status: DC

## 2014-12-14 MED ORDER — INSULIN ASPART 100 UNIT/ML ~~LOC~~ SOLN
1.0000 [IU] | Freq: Once | SUBCUTANEOUS | Status: AC
  Administered 2014-12-14: 1 [IU] via INTRAVENOUS
  Filled 2014-12-14: qty 1

## 2014-12-14 MED ORDER — HYDROMORPHONE HCL 1 MG/ML IJ SOLN
1.0000 mg | Freq: Once | INTRAMUSCULAR | Status: AC
Start: 2014-12-14 — End: 2014-12-14
  Administered 2014-12-14: 1 mg via INTRAVENOUS
  Filled 2014-12-14 (×2): qty 1

## 2014-12-14 MED ORDER — HYDROMORPHONE HCL 1 MG/ML IJ SOLN
1.0000 mg | Freq: Once | INTRAMUSCULAR | Status: AC
Start: 2014-12-14 — End: 2014-12-14
  Administered 2014-12-14: 1 mg via INTRAVENOUS
  Filled 2014-12-14: qty 1

## 2014-12-14 NOTE — ED Provider Notes (Signed)
CSN: [id number]     Arrival date & time 12/14/14  1241 History   First MD Initiated Contact with Patient 12/14/14 1246     Chief Complaint  Patient presents with  . Abdominal Pain  . Diarrhea  . Emesis     (Consider location/radiation/quality/duration/timing/severity/associated sxs/prior Treatment) The history is provided by the patient.  [name] is a 34 y.o. female hx of type 1 diabetes with DKA, anxiety, recurrent C diff, gastroparesis here with vomiting, abdominal pain, diarrhea. Patient was recently admitted about a week ago at [facility]. She had positive C. difficile and is on by mouth vancomycin. She also may have been in DKA at that time. Sates that she was also given Rocephin in the hospital for UTI but was not discharged on any antibiotics other than vancomycin. Has been profusely vomiting for the last 3-4 days. Vomited about 20 times since yesterday. States that she was unable to keep her vancomycin down. Denies any fevers but does have some abdominal cramps. States that she has more than 30 episodes of watery diarrhea. 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    Past Medical History  Diagnosis Date  . DM type 1 causing complication ([place]) dx 4098    hyperglycemia +/- DKA + coma, severe hypoglycemia, gastroparesis, peripheral neuropathy  . Thyroid disease     hypothyroidism associated with pregnancy  . Back pain   . PONV (postoperative nausea and vomiting)   . Anxiety     hx panic attacks.   Marland Kitchen GERD (gastroesophageal reflux disease)   . DM gastroparesis ([place])   . Anemia     .Autoimmune hemolytic anemia. bone marrow bx 2008, Dr Marin Olp  . Colitis, Clostridium difficile 05/2014  . Fatty liver 01/2007    with hepatomegaly on CT scan.    Past Surgical History  Procedure Laterality Date  . Laparoscopy  02/2002, 06/2010    laparoscopy with lysis pelvic adhesions for pelvic pain  . Back surgery      age 50  . Tooth extraction    . Root canal  10/10/12  .  Flexible sigmoidoscopy N/A 06/24/2014    Procedure: FLEXIBLE SIGMOIDOSCOPY;  Surgeon: Milus Banister, MD;  Location: [facility];  Service: Endoscopy;  Laterality: N/A;  . Esophagogastroduodenoscopy (egd) with propofol N/A 06/27/2014    Procedure: ESOPHAGOGASTRODUODENOSCOPY (EGD) WITH PROPOFOL;  Surgeon: Milus Banister, MD;  Location: [facility];  Service: Endoscopy;  Laterality: N/A;   Family History  Problem Relation Age of Onset  . Diabetes Maternal Grandmother   . Cancer Maternal Grandmother     Leukemia  . Cancer Maternal Grandfather     Small cell lung cancer   Social History  Substance Use Topics  . Smoking status: Former Smoker    Quit date: 04/19/2000  . Smokeless tobacco: Former Systems developer  . Alcohol Use: No     Comment: RARE SIPS OF WINE   OB History    No data available     Review of Systems  Gastrointestinal: Positive for vomiting, abdominal pain and diarrhea.  All other systems reviewed and are negative.     Allergies  Scallops; Haloperidol and related; Morphine and related; Toradol; Latex; and Other  Home Medications   Prior to Admission medications   Medication Sig Start Date End Date Taking? Authorizing Provider  acetaminophen (TYLENOL) 500 MG tablet Take 1,000 mg by mouth every 6 (six) hours as needed for moderate pain.    Yes Historical Provider, MD  ALPRAZolam Duanne Moron) 1  MG tablet Take 1 mg by mouth at bedtime as needed for anxiety.    Yes Historical Provider, MD  amitriptyline (ELAVIL) 50 MG tablet Take 2 tablets (100 mg total) by mouth at bedtime. 08/07/14  Yes Arnoldo Morale, MD  CALCIUM PO Take 2 tablets by mouth daily.   Yes Historical Provider, MD  cholestyramine Lucrezia Starch) 4 G packet Take 1 packet (4 g total) by mouth 2 (two) times daily. Patient taking differently: Take 4 g by mouth 4 (four) times daily.  09/19/14  Yes Jeffrey Hedges, PA-C  CREON 36000 UNITS CPEP capsule Take 4-6 capsules by mouth. Takes 6 capsules with every meal and 4 capsules with  every snack. 12/02/14  Yes Historical Provider, MD  cyanocobalamin 500 MCG tablet Take 2,000 mcg by mouth 2 (two) times daily as needed (mouth sores).    Yes Historical Provider, MD  dicyclomine (BENTYL) 10 MG capsule Take 10 mg by mouth 4 (four) times daily -  before meals and at bedtime.   Yes Historical Provider, MD  feeding supplement, GLUCERNA SHAKE, (GLUCERNA SHAKE) LIQD Take 237 mLs by mouth 3 (three) times daily between meals. Patient taking differently: Take 237 mLs by mouth daily.  07/06/14  Yes Thurnell Lose, MD  gabapentin (NEURONTIN) 100 MG capsule Take 300 mg by mouth at bedtime.    Yes Historical Provider, MD  glucagon (GLUCAGON EMERGENCY) 1 MG injection Inject 1 mg into the muscle once as needed (severe hypoglycemia). 08/14/14  Yes Arnoldo Morale, MD  insulin aspart (NOVOLOG) 100 UNIT/ML injection Inject 0-5 Units into the skin 3 (three) times daily with meals. Patient taking differently: Inject 0-5 Units into the skin 3 (three) times daily with meals. Sliding scale 08/01/14  Yes Albertine Patricia, MD  insulin glargine (LANTUS) 100 UNIT/ML injection Inject 0.2 mLs (20 Units total) into the skin every morning. Patient taking differently: Inject 7 Units into the skin 2 (two) times daily. 7 units in the morning and 7 units at night 08/07/14  Yes Arnoldo Morale, MD  Multiple Vitamin (MULTIVITAMIN WITH MINERALS) TABS Take 1 tablet by mouth every morning. Diabetic support vitamin   Yes Historical Provider, MD  omeprazole (PRILOSEC) 40 MG capsule Take 40 mg by mouth daily as needed (indigestion).    Yes Historical Provider, MD  ondansetron (ZOFRAN ODT) 4 MG disintegrating tablet 4mg  ODT q4 hours prn nausea/vomit 11/28/14  Yes Milton Ferguson, MD  oxyCODONE-acetaminophen (PERCOCET) 10-325 MG tablet Take 1 tablet by mouth every 4 (four) hours as needed for pain.   Yes Historical Provider, MD  Potassium (POTASSIMIN PO) Take 2 capsules by mouth daily.   Yes Historical Provider, MD  Probiotic Product  (DIGESTIVE ADVANTAGE GUMMIES PO) Take 2 tablets by mouth 2 (two) times daily.   Yes Historical Provider, MD  promethazine (PHENERGAN) 25 MG suppository Place 1 suppository (25 mg total) rectally every 6 (six) hours as needed for nausea or vomiting. 11/18/14  Yes Antonietta Breach, PA-C  promethazine (PHENERGAN) 25 MG tablet Take 1 tablet (25 mg total) by mouth every 6 (six) hours as needed for nausea or vomiting. 11/08/14  Yes Orpah Greek, MD  saccharomyces boulardii (FLORASTOR) 250 MG capsule Take 1 capsule (250 mg total) by mouth 2 (two) times daily. 07/06/14  Yes Thurnell Lose, MD  vancomycin (VANCOCIN) 125 MG capsule Take 1 capsule by mouth every 6 (six) hours. 12/06/14  Yes Historical Provider, MD  diphenoxylate-atropine (LOMOTIL) 2.5-0.025 MG per tablet Take 1 tablet by mouth 4 (four) times daily as  needed for diarrhea or loose stools. Patient not taking: Reported on 12/14/2014 11/08/14   Orpah Greek, MD  HUMALOG 100 UNIT/ML cartridge Inject 5-15 Units as directed 3 (three) times daily. 11/12/14   Historical Provider, MD  HYDROcodone-acetaminophen (NORCO/VICODIN) 5-325 MG tablet Take 1 tablet by mouth every 6 (six) hours as needed for moderate pain. Patient not taking: Reported on 12/14/2014 11/28/14   Milton Ferguson, MD  Lactobacillus Esau Grew) PACK Mix 1/2 packet with soft food; take twice a day for 5 days Patient not taking: Reported on 11/28/2014 11/18/14   Antonietta Breach, PA-C  levofloxacin (LEVAQUIN) 250 MG tablet Take 1 tablet (250 mg total) by mouth daily. Patient not taking: Reported on 12/14/2014 11/28/14   Milton Ferguson, MD  ondansetron (ZOFRAN) 4 MG tablet Take 1 tablet (4 mg total) by mouth every 6 (six) hours. Patient not taking: Reported on 12/14/2014 11/18/14   Antonietta Breach, PA-C  traZODone (DESYREL) 50 MG tablet Take 1 tablet (50 mg total) by mouth at bedtime as needed for sleep. Patient not taking: Reported on 11/28/2014 09/12/14   Arnoldo Morale, MD   BP 99/70 mmHg  Pulse  103  Temp(Src) 98.2 F (36.8 C)  Resp 20  SpO2 100% Physical Exam  Constitutional: She is oriented to person, place, and time.  Chronically ill, dehydrated   HENT:  Head: Normocephalic.  MM dry   Eyes: Conjunctivae are normal. Pupils are equal, round, and reactive to light.  Neck: Normal range of motion. Neck supple.  Cardiovascular: Regular rhythm and normal heart sounds.   Tachycardic   Pulmonary/Chest: Effort normal and breath sounds normal. No respiratory distress. She has no wheezes. She has no rales.  Abdominal: Soft. Bowel sounds are normal.  Mild diffuse tenderness, increased bowel sounds. No rebound   Musculoskeletal: Normal range of motion. She exhibits no edema or tenderness.  Neurological: She is alert and oriented to person, place, and time.  Skin: Skin is warm and dry.  Psychiatric: She has a normal mood and affect. Her behavior is normal. Judgment and thought content normal.  Nursing note and vitals reviewed.   ED Course  Procedures (including critical care time) Labs Review Labs Reviewed  COMPREHENSIVE METABOLIC PANEL - Abnormal; Notable for the following:    Sodium 129 (*)    Chloride 89 (*)    Glucose, Bld 484 (*)    BUN 22 (*)    AST 57 (*)    All other components within normal limits  BLOOD GAS, VENOUS - Abnormal; Notable for the following:    pH, Ven 7.463 (*)    pO2, Ven 63.0 (*)    Bicarbonate 32.1 (*)    Acid-Base Excess 7.7 (*)    All other components within normal limits  CBG MONITORING, ED - Abnormal; Notable for the following:    Glucose-Capillary 421 (*)    All other components within normal limits  CBG MONITORING, ED - Abnormal; Notable for the following:    Glucose-Capillary 275 (*)    All other components within normal limits  C DIFFICILE QUICK SCREEN W PCR REFLEX  CBC WITH DIFFERENTIAL/PLATELET  LIPASE, BLOOD  URINALYSIS, ROUTINE W REFLEX MICROSCOPIC (NOT AT [facility])  I-STAT BETA HCG BLOOD, ED ([facility], [facility], [facility] ONLY)    Imaging  Review Dg Abd Acute W/chest  12/14/2014  CLINICAL DATA:  Diarrhea, vomiting, and abdominal pain. Recently diagnosed with Clostridium difficile. EXAM: DG ABDOMEN ACUTE W/ 1V CHEST COMPARISON:  Chest radiograph 11/29/2014. CT abdomen and pelvis 09/19/2014. FINDINGS: The cardiomediastinal  silhouette is within normal limits. The lungs are well inflated and clear. There is no evidence of pleural effusion or pneumothorax. No acute osseous abnormality is identified. Surgical clips are noted at the thoracic inlet. There is moderate distention of the stomach with gas and debris. The liver appears mildly enlarged. Gas is present in nondilated loops of small and large bowel without evidence of obstruction. There is no evidence of intraperitoneal free air. IMPRESSION: 1.  No acute cardiopulmonary disease. 2. Nonobstructed bowel-gas pattern. 3. Moderate distention of the stomach. Electronically Signed   By: Logan Bores M.D.   On: 12/14/2014 13:52   I have personally reviewed and evaluated these images and lab results as part of my medical decision-making.   EKG Interpretation None      MDM   Final diagnoses:  None   [name] is a 34 y.o. female here with vomiting, diarrhea. Consider gastroparesis vs recurrent C diff vs DKA. CBG 420 in the ED. Will get labs, VBG, UA. Had multiple CTs in the past, will get acute ab series. Will check C diff. Will hydrate and reassess.   4:08 PM AG nl, not acidotic. C diff neg here, not sure if its because she is already on PO vanc. CBG dec to 275 after insulin, 2 L NS. Xray unremarkable. Has chronic diarrhea and vomiting and has been seeing GI. Tolerated PO fluids in the ED. Stable for dc. I reassured patient.     Wandra Arthurs, MD 12/14/14 [id number]

## 2014-12-14 NOTE — Discharge Instructions (Signed)
Stay hydrated.   Take phenergan for nausea.  Use insulin for your diabetes.   See your doctor and GI doctor   Return to ER if you have worse vomiting, dehydration, blood sugar > 500, fevers, severe abdominal pain.

## 2014-12-14 NOTE — ED Notes (Signed)
Pt from home reports that she was dx with cdiff 5 days ago. Pt reports emesis, diarrhea and abd pain since. Pt sts that she "goes into DKA easily" and is not feeling well. Pt is A&O and in NAD

## 2014-12-14 NOTE — ED Notes (Signed)
Per Dr Darl Householder, pt to be discharged after all fluids have been given.

## 2014-12-14 NOTE — ED Notes (Signed)
Patient is aware urine sample is needed but constant diarrhea- will talk to nurse

## 2014-12-19 ENCOUNTER — Encounter (HOSPITAL_COMMUNITY): Payer: Self-pay | Admitting: Emergency Medicine

## 2014-12-19 ENCOUNTER — Emergency Department (HOSPITAL_COMMUNITY)
Admission: EM | Admit: 2014-12-19 | Discharge: 2014-12-19 | Disposition: A | Discharge status: Home / Self Care | Payer: Medicaid Other | Attending: Emergency Medicine | Admitting: Emergency Medicine

## 2014-12-19 DIAGNOSIS — K219 Gastro-esophageal reflux disease without esophagitis: Secondary | ICD-10-CM | POA: Insufficient documentation

## 2014-12-19 DIAGNOSIS — Z79899 Other long term (current) drug therapy: Secondary | ICD-10-CM | POA: Insufficient documentation

## 2014-12-19 DIAGNOSIS — F410 Panic disorder [episodic paroxysmal anxiety] without agoraphobia: Secondary | ICD-10-CM | POA: Diagnosis not present

## 2014-12-19 DIAGNOSIS — Z87891 Personal history of nicotine dependence: Secondary | ICD-10-CM | POA: Diagnosis not present

## 2014-12-19 DIAGNOSIS — R112 Nausea with vomiting, unspecified: Secondary | ICD-10-CM

## 2014-12-19 DIAGNOSIS — E860 Dehydration: Secondary | ICD-10-CM | POA: Insufficient documentation

## 2014-12-19 DIAGNOSIS — R000 Tachycardia, unspecified: Secondary | ICD-10-CM | POA: Insufficient documentation

## 2014-12-19 DIAGNOSIS — E1065 Type 1 diabetes mellitus with hyperglycemia: Secondary | ICD-10-CM | POA: Diagnosis not present

## 2014-12-19 DIAGNOSIS — E039 Hypothyroidism, unspecified: Secondary | ICD-10-CM | POA: Insufficient documentation

## 2014-12-19 DIAGNOSIS — Z794 Long term (current) use of insulin: Secondary | ICD-10-CM | POA: Diagnosis not present

## 2014-12-19 DIAGNOSIS — Z91040 Latex allergy status: Secondary | ICD-10-CM | POA: Diagnosis not present

## 2014-12-19 DIAGNOSIS — E1043 Type 1 diabetes mellitus with diabetic autonomic (poly)neuropathy: Secondary | ICD-10-CM | POA: Insufficient documentation

## 2014-12-19 DIAGNOSIS — Z792 Long term (current) use of antibiotics: Secondary | ICD-10-CM | POA: Diagnosis not present

## 2014-12-19 DIAGNOSIS — Z862 Personal history of diseases of the blood and blood-forming organs and certain disorders involving the immune mechanism: Secondary | ICD-10-CM | POA: Diagnosis not present

## 2014-12-19 LAB — COMPREHENSIVE METABOLIC PANEL
ALT: 63 U/L — ABNORMAL HIGH (ref 14–54)
AST: 56 U/L — ABNORMAL HIGH (ref 15–41)
Albumin: 5.1 g/dL — ABNORMAL HIGH (ref 3.5–5.0)
Alkaline Phosphatase: 74 U/L (ref 38–126)
Anion gap: 11 (ref 5–15)
BUN: 11 mg/dL (ref 6–20)
CHLORIDE: 90 mmol/L — AB (ref 101–111)
CO2: 29 mmol/L (ref 22–32)
Calcium: 9.4 mg/dL (ref 8.9–10.3)
Creatinine, Ser: 1.08 mg/dL — ABNORMAL HIGH (ref 0.44–1.00)
Glucose, Bld: 466 mg/dL — ABNORMAL HIGH (ref 65–99)
POTASSIUM: 3.1 mmol/L — AB (ref 3.5–5.1)
SODIUM: 130 mmol/L — AB (ref 135–145)
Total Bilirubin: 0.7 mg/dL (ref 0.3–1.2)
Total Protein: 8.5 g/dL — ABNORMAL HIGH (ref 6.5–8.1)

## 2014-12-19 LAB — CBC WITH DIFFERENTIAL/PLATELET
Basophils Absolute: 0 10*3/uL (ref 0.0–0.1)
Basophils Relative: 0 %
EOS ABS: 0.1 10*3/uL (ref 0.0–0.7)
EOS PCT: 1 %
HCT: 38 % (ref 36.0–46.0)
Hemoglobin: 13 g/dL (ref 12.0–15.0)
LYMPHS ABS: 2.2 10*3/uL (ref 0.7–4.0)
Lymphocytes Relative: 35 %
MCH: 29.7 pg (ref 26.0–34.0)
MCHC: 34.2 g/dL (ref 30.0–36.0)
MCV: 87 fL (ref 78.0–100.0)
MONO ABS: 0.3 10*3/uL (ref 0.1–1.0)
MONOS PCT: 5 %
Neutro Abs: 3.7 10*3/uL (ref 1.7–7.7)
Neutrophils Relative %: 59 %
PLATELETS: 425 10*3/uL — AB (ref 150–400)
RBC: 4.37 MIL/uL (ref 3.87–5.11)
RDW: 12.6 % (ref 11.5–15.5)
WBC: 6.3 10*3/uL (ref 4.0–10.5)

## 2014-12-19 LAB — CBG MONITORING, ED
GLUCOSE-CAPILLARY: 430 mg/dL — AB (ref 65–99)
GLUCOSE-CAPILLARY: 375 mg/dL — AB (ref 65–99)
GLUCOSE-CAPILLARY: 267 mg/dL — AB (ref 65–99)

## 2014-12-19 MED ORDER — POTASSIUM CHLORIDE CRYS ER 20 MEQ PO TBCR
40.0000 meq | EXTENDED_RELEASE_TABLET | Freq: Once | ORAL | Status: AC
  Administered 2014-12-19: 40 meq via ORAL
  Filled 2014-12-19: qty 2

## 2014-12-19 MED ORDER — INSULIN ASPART 100 UNIT/ML ~~LOC~~ SOLN: 5.0000 [IU] | Freq: Once | SUBCUTANEOUS | Status: DC

## 2014-12-19 MED ORDER — SODIUM CHLORIDE 0.9 % IV BOLUS (SEPSIS): 1000.0000 mL | Freq: Once | INTRAVENOUS | Status: DC

## 2014-12-19 MED ORDER — HYDROMORPHONE HCL 1 MG/ML IJ SOLN
1.0000 mg | Freq: Once | INTRAMUSCULAR | Status: AC
  Administered 2014-12-19: 1 mg via INTRAVENOUS
  Filled 2014-12-19: qty 1

## 2014-12-19 MED ORDER — SODIUM CHLORIDE 0.9 % IV BOLUS (SEPSIS)
1000.0000 mL | Freq: Once | INTRAVENOUS | Status: AC
  Administered 2014-12-19: 1000 mL via INTRAVENOUS

## 2014-12-19 MED ORDER — PROMETHAZINE HCL 25 MG RE SUPP: 25.0000 mg | Freq: Four times a day (QID) | RECTAL | Status: DC | PRN

## 2014-12-19 MED ORDER — POTASSIUM CHLORIDE 10 MEQ/100ML IV SOLN
10.0000 meq | Freq: Once | INTRAVENOUS | Status: AC
  Administered 2014-12-19: 10 meq via INTRAVENOUS
  Filled 2014-12-19: qty 100

## 2014-12-19 MED ORDER — SODIUM CHLORIDE 0.9 % IV SOLN
INTRAVENOUS | Status: DC
  Administered 2014-12-19: 3.2 [IU]/h via INTRAVENOUS
  Administered 2014-12-19: 2.1 [IU]/h via INTRAVENOUS
  Filled 2014-12-19: qty 2.5

## 2014-12-19 MED ORDER — METOCLOPRAMIDE HCL 10 MG PO TABS: 10.0000 mg | ORAL_TABLET | Freq: Three times a day (TID) | ORAL | Status: DC

## 2014-12-19 MED ORDER — METOCLOPRAMIDE HCL 10 MG PO TABS
10.0000 mg | ORAL_TABLET | Freq: Three times a day (TID) | ORAL | Status: DC
  Administered 2014-12-19: 10 mg via ORAL
  Filled 2014-12-19: qty 1

## 2014-12-19 MED ORDER — POTASSIUM CHLORIDE CRYS ER 20 MEQ PO TBCR: 40.0000 meq | EXTENDED_RELEASE_TABLET | Freq: Once | ORAL | Status: DC

## 2014-12-19 MED ORDER — FENTANYL CITRATE (PF) 100 MCG/2ML IJ SOLN
50.0000 ug | Freq: Once | INTRAMUSCULAR | Status: AC
  Administered 2014-12-19: 50 ug via INTRAVENOUS
  Filled 2014-12-19: qty 2

## 2014-12-19 MED ORDER — PROMETHAZINE HCL 25 MG/ML IJ SOLN
25.0000 mg | Freq: Once | INTRAMUSCULAR | Status: AC
  Administered 2014-12-19: 25 mg via INTRAVENOUS
  Filled 2014-12-19: qty 1

## 2014-12-19 MED ORDER — POTASSIUM CHLORIDE IN NACL 20-0.9 MEQ/L-% IV SOLN
Freq: Once | INTRAVENOUS | Status: DC
  Filled 2014-12-19: qty 1000

## 2014-12-19 MED ORDER — HYDROMORPHONE HCL 2 MG/ML IJ SOLN
2.0000 mg | Freq: Once | INTRAMUSCULAR | Status: AC
  Administered 2014-12-19: 2 mg via INTRAVENOUS
  Filled 2014-12-19: qty 1

## 2014-12-19 NOTE — ED Provider Notes (Signed)
4:30 PM Assumed care from Dr. Audie Pinto, please see their note for full history, physical and decision making until this point. In brief this is a 34 y.o. year old female who presented to the ED tonight with Diarrhea; Nausea; and Emesis     34 year old female here with hyperglycemia and no DKA. His CT scan which was similar to previous. Plan is to work on making her blood sugar better make patient feel better.  On reexamination patient still with 5 out of 10 abdominal pain. Her vital signs have improved her blood sugar has improved. I discussed I would treat her with one more dose of pain medication however there is no emergent causes for her symptoms and patient agreed to discharged to follow-up with primary doctor. She also requested a refill of her Phenergan suppositories.  Discharge instructions, including strict return precautions for new or worsening symptoms, given. Patient and/or family verbalized understanding and agreement with the plan as described.   Labs Reviewed  CBC WITH DIFFERENTIAL/PLATELET - Abnormal; Notable for the following:    Platelets 425 (*)    All other components within normal limits  COMPREHENSIVE METABOLIC PANEL - Abnormal; Notable for the following:    Sodium 130 (*)    Potassium 3.1 (*)    Chloride 90 (*)    Glucose, Bld 466 (*)    Creatinine, Ser 1.08 (*)    Total Protein 8.5 (*)    Albumin 5.1 (*)    AST 56 (*)    ALT 63 (*)    All other components within normal limits  CBG MONITORING, ED - Abnormal; Notable for the following:    Glucose-Capillary 375 (*)    All other components within normal limits  CBG MONITORING, ED - Abnormal; Notable for the following:    Glucose-Capillary 267 (*)    All other components within normal limits    New Prescriptions   METOCLOPRAMIDE (REGLAN) 10 MG TABLET    Take 1 tablet (10 mg total) by mouth 3 (three) times daily before meals.   PROMETHAZINE (PHENERGAN) 25 MG SUPPOSITORY    Place 1 suppository (25 mg total) rectally  every 6 (six) hours as needed for nausea or vomiting.    Merrily Pew, MD 12/19/14 [phone]

## 2014-12-19 NOTE — ED Provider Notes (Signed)
CSN: [id number]     Arrival date & time 12/19/14  1109 History   First MD Initiated Contact with Patient 12/19/14 1130     Chief Complaint  Patient presents with  . Diarrhea  . Nausea  . Emesis   (Consider location/radiation/quality/duration/timing/severity/associated sxs/prior Treatment) HPI Comments: [name] is a 34 year old female with type 1 DM, recurrent C. Diff and gastroparesis presents to the ED with nausea, vomiting, abdominal pain and diarrhea. She reports these symptoms have been ongoing since December of 2015. She was recently admitted for DKA at [facility]. She was found to have C. Diff at that time and is currently on PO vanc therapy. She was seen on 10/22 with the same complaints. C. Diff was negative at that time. She reports uncontrolled vomiting and diarrhea >10 times a day. Symptoms not controlled with any antiemetic or antidiarrheal. Her sugars are difficult to control. She is on Lantus 7 units bid with SSI qac. Reports CBGs vary from 80s to 300s. CBG is 430 on presentation today.     Patient is a 34 y.o. female presenting with diarrhea and vomiting. The history is limited by a language barrier.  Diarrhea Associated symptoms: abdominal pain and vomiting   Associated symptoms: no chills, no diaphoresis and no fever   Emesis Associated symptoms: abdominal pain and diarrhea   Associated symptoms: no chills     Past Medical History  Diagnosis Date  . DM type 1 causing complication ([place]) dx 0093    hyperglycemia +/- DKA + coma, severe hypoglycemia, gastroparesis, peripheral neuropathy  . Thyroid disease     hypothyroidism associated with pregnancy  . Back pain   . PONV (postoperative nausea and vomiting)   . Anxiety     hx panic attacks.   Marland Kitchen GERD (gastroesophageal reflux disease)   . DM gastroparesis ([place])   . Anemia     .Autoimmune hemolytic anemia. bone marrow bx 2008, Dr Marin Olp  . Colitis, Clostridium difficile 05/2014  . Fatty liver 01/2007   with hepatomegaly on CT scan.    Past Surgical History  Procedure Laterality Date  . Laparoscopy  02/2002, 06/2010    laparoscopy with lysis pelvic adhesions for pelvic pain  . Back surgery      age 49  . Tooth extraction    . Root canal  10/10/12  . Flexible sigmoidoscopy N/A 06/24/2014    Procedure: FLEXIBLE SIGMOIDOSCOPY;  Surgeon: Milus Banister, MD;  Location: [facility];  Service: Endoscopy;  Laterality: N/A;  . Esophagogastroduodenoscopy (egd) with propofol N/A 06/27/2014    Procedure: ESOPHAGOGASTRODUODENOSCOPY (EGD) WITH PROPOFOL;  Surgeon: Milus Banister, MD;  Location: [facility];  Service: Endoscopy;  Laterality: N/A;   Family History  Problem Relation Age of Onset  . Diabetes Maternal Grandmother   . Cancer Maternal Grandmother     Leukemia  . Cancer Maternal Grandfather     Small cell lung cancer   Social History  Substance Use Topics  . Smoking status: Former Smoker    Quit date: 04/19/2000  . Smokeless tobacco: Former Systems developer  . Alcohol Use: No     Comment: RARE SIPS OF WINE   OB History    No data available     Review of Systems  Constitutional: Positive for appetite change and unexpected weight change. Negative for fever, chills and diaphoresis.  HENT: Negative.   Eyes: Negative.   Respiratory: Negative.   Cardiovascular: Negative.   Gastrointestinal: Positive for nausea, vomiting, abdominal pain and diarrhea.  Negative for blood in stool.  Genitourinary: Negative.   Musculoskeletal: Negative.   Skin: Negative.   Allergic/Immunologic: Negative.   Hematological: Negative.   Psychiatric/Behavioral: Negative.       Allergies  Scallops; Haloperidol and related; Morphine and related; Toradol; Latex; and Other  Home Medications   Prior to Admission medications   Medication Sig Start Date End Date Taking? Authorizing Provider  acetaminophen (TYLENOL) 500 MG tablet Take 1,000 mg by mouth every 6 (six) hours as needed for moderate pain.    Yes Historical  Provider, MD  ALPRAZolam Duanne Moron) 1 MG tablet Take 2 mg by mouth 2 (two) times daily as needed for anxiety.    Yes Historical Provider, MD  amitriptyline (ELAVIL) 50 MG tablet Take 2 tablets (100 mg total) by mouth at bedtime. 08/07/14  Yes Arnoldo Morale, MD  CALCIUM PO Take 2 tablets by mouth daily.   Yes Historical Provider, MD  cholestyramine Lucrezia Starch) 4 G packet Take 1 packet (4 g total) by mouth 2 (two) times daily. Patient taking differently: Take 4 g by mouth 4 (four) times daily.  09/19/14  Yes Jeffrey Hedges, PA-C  CREON 36000 UNITS CPEP capsule Take 4-6 capsules by mouth. Takes 6 capsules with every meal and 4 capsules with every snack. 12/02/14  Yes Historical Provider, MD  cyanocobalamin 500 MCG tablet Take 2,000 mcg by mouth 2 (two) times daily as needed (mouth sores).    Yes Historical Provider, MD  dicyclomine (BENTYL) 10 MG capsule Take 10 mg by mouth 4 (four) times daily -  before meals and at bedtime.   Yes Historical Provider, MD  diphenoxylate-atropine (LOMOTIL) 2.5-0.025 MG per tablet Take 1 tablet by mouth 4 (four) times daily as needed for diarrhea or loose stools. 11/08/14  Yes Orpah Greek, MD  feeding supplement, GLUCERNA SHAKE, (GLUCERNA SHAKE) LIQD Take 237 mLs by mouth 3 (three) times daily between meals. Patient taking differently: Take 237 mLs by mouth daily.  07/06/14  Yes Thurnell Lose, MD  gabapentin (NEURONTIN) 100 MG capsule Take 300 mg by mouth at bedtime.    Yes Historical Provider, MD  glucagon (GLUCAGON EMERGENCY) 1 MG injection Inject 1 mg into the muscle once as needed (severe hypoglycemia). 08/14/14  Yes Arnoldo Morale, MD  HUMALOG 100 UNIT/ML cartridge Inject 5-15 Units as directed 3 (three) times daily. 11/12/14  Yes Historical Provider, MD  insulin aspart (NOVOLOG FLEXPEN) 100 UNIT/ML FlexPen Inject 1-5 Units into the skin 3 (three) times daily with meals.   Yes Historical Provider, MD  insulin glargine (LANTUS) 100 UNIT/ML injection Inject 0.2 mLs (20  Units total) into the skin every morning. Patient taking differently: Inject 7 Units into the skin 2 (two) times daily. 7 units in the morning and 7 units at night 08/07/14  Yes Arnoldo Morale, MD  Lactobacillus (LACTINEX) PACK Mix 1/2 packet with soft food; take twice a day for 5 days 11/18/14  Yes Antonietta Breach, PA-C  Multiple Vitamin (MULTIVITAMIN WITH MINERALS) TABS Take 2 tablets by mouth daily. Diabetic support vitamin   Yes Historical Provider, MD  omeprazole (PRILOSEC) 40 MG capsule Take 40 mg by mouth daily as needed (indigestion).    Yes Historical Provider, MD  ondansetron (ZOFRAN ODT) 4 MG disintegrating tablet 4mg  ODT q4 hours prn nausea/vomit 11/28/14  Yes Milton Ferguson, MD  ondansetron (ZOFRAN) 4 MG tablet Take 1 tablet (4 mg total) by mouth every 6 (six) hours. Patient taking differently: Take 4 mg by mouth every 6 (six) hours as needed  for nausea or vomiting.  11/18/14  Yes Antonietta Breach, PA-C  oxyCODONE-acetaminophen (PERCOCET) 10-325 MG tablet Take 0.5 tablets by mouth every 6 (six) hours as needed for pain.    Yes Historical Provider, MD  Potassium (POTASSIMIN PO) Take 2 capsules by mouth daily.   Yes Historical Provider, MD  Probiotic Product (DIGESTIVE ADVANTAGE GUMMIES PO) Take 2 tablets by mouth 2 (two) times daily.   Yes Historical Provider, MD  promethazine (PHENERGAN) 25 MG suppository Place 1 suppository (25 mg total) rectally every 6 (six) hours as needed for nausea or vomiting. 12/14/14  Yes Wandra Arthurs, MD  saccharomyces boulardii (FLORASTOR) 250 MG capsule Take 1 capsule (250 mg total) by mouth 2 (two) times daily. 07/06/14  Yes Thurnell Lose, MD  vancomycin (VANCOCIN) 125 MG capsule Take 1 capsule by mouth every 6 (six) hours. 12/06/14  Yes Historical Provider, MD  HYDROcodone-acetaminophen (NORCO/VICODIN) 5-325 MG tablet Take 1 tablet by mouth every 6 (six) hours as needed for moderate pain. Patient not taking: Reported on 12/14/2014 11/28/14   Milton Ferguson, MD  insulin  aspart (NOVOLOG) 100 UNIT/ML injection Inject 0-5 Units into the skin 3 (three) times daily with meals. Patient taking differently: Inject 0-5 Units into the skin 3 (three) times daily with meals. Sliding scale 08/01/14   Albertine Patricia, MD  levofloxacin (LEVAQUIN) 250 MG tablet Take 1 tablet (250 mg total) by mouth daily. Patient not taking: Reported on 12/14/2014 11/28/14   Milton Ferguson, MD  metoCLOPramide (REGLAN) 10 MG tablet Take 1 tablet (10 mg total) by mouth 3 (three) times daily before meals. 12/19/14   Maryellen Pile, MD  traZODone (DESYREL) 50 MG tablet Take 1 tablet (50 mg total) by mouth at bedtime as needed for sleep. Patient not taking: Reported on 11/28/2014 09/12/14   Arnoldo Morale, MD   BP 103/71 mmHg  Pulse 104  Temp(Src) 98.2 F (36.8 C) (Oral)  Resp 18  Ht 5\' 5"  (1.651 m)  Wt 93 lb (42.185 kg)  BMI 15.48 kg/m2  SpO2 100% Physical Exam  Constitutional: She is oriented to person, place, and time. She appears cachectic. She has a sickly appearance.  HENT:  Head: Normocephalic and atraumatic.  Eyes: Conjunctivae and EOM are normal. Pupils are equal, round, and reactive to light.  Neck: Normal range of motion. Neck supple.  Cardiovascular: Regular rhythm.  Tachycardia present.  Exam reveals no gallop and no friction rub.   No murmur heard. Pulmonary/Chest: Effort normal and breath sounds normal. She has no wheezes.  Abdominal: Soft. Bowel sounds are increased. There is generalized tenderness. There is no rigidity, no rebound, no guarding and no CVA tenderness.  Musculoskeletal: Normal range of motion.  Neurological: She is alert and oriented to person, place, and time. No cranial nerve deficit.  Skin: Skin is warm and dry.  Psychiatric: She has a normal mood and affect.    ED Course  Procedures (including critical care time) Labs Review Labs Reviewed  CBC WITH DIFFERENTIAL/PLATELET - Abnormal; Notable for the following:    Platelets 425 (*)    All other  components within normal limits  COMPREHENSIVE METABOLIC PANEL - Abnormal; Notable for the following:    Sodium 130 (*)    Potassium 3.1 (*)    Chloride 90 (*)    Glucose, Bld 466 (*)    Creatinine, Ser 1.08 (*)    Total Protein 8.5 (*)    Albumin 5.1 (*)    AST 56 (*)    ALT 63 (*)  All other components within normal limits  CBG MONITORING, ED - Abnormal; Notable for the following:    Glucose-Capillary 375 (*)    All other components within normal limits    Imaging Review No results found. I have personally reviewed and evaluated these images and lab results as part of my medical decision-making.   EKG Interpretation None      MDM   Final diagnoses:  Hyperglycemia due to type 1 diabetes mellitus (HCC)  Non-intractable vomiting with nausea, vomiting of unspecified type  Dehydration   Patient with several month history of uncontrolled nausea, vomiting, diarrhea. Symptoms exacerbated by her brittle diabetes with highly labile sugars. She is currently on treatment for C. Diff with test negative on 10/22. CBG was 430 on arrival. Does appear dehydrated on exam with AKI on CMP.   Will treat with IVF, insulin, replete K and with symptomatic control. Patient with multiple ED visit for similar complaints. Spoke with her about admission and she would prefer to not be admitted as she has appointments with her Endocrinologist and Gastroenterologist on 10/31 and 11/1. If her sugars are controlled following fluid resuscitation and insulin with good symptomatic control, will discharge with follow up next week.     Maryellen Pile, MD 12/19/14 1606  Leonard Schwartz, MD 12/20/14 [phone]

## 2014-12-19 NOTE — Discharge Instructions (Signed)
Please make sure you are drinking plenty of fluids at home and eat as much as you can tolerate. Use the phenergan as needed for the nausea. I am also giving you a prescription for Reglan. Take it 30 minutes before meals and this may help with the nausea and vomiting you have with meals.   Please follow up with your Endocrinologist on Monday and your Gastroenterologist on Tuesday.

## 2014-12-19 NOTE — ED Notes (Signed)
Pt states that she has had NVD since last December.  States that it has gotten worse in the last year.  States that she is a brittle diabetic.  Last insulin was this morning.

## 2014-12-19 NOTE — ED Notes (Signed)
Discharge instructions, follow-up care, and rx x2 reviewed with patient. Patient verbalized understanding. Patient states mother is driving her home.

## 2014-12-19 NOTE — Progress Notes (Signed)
Pt confirmed with ED Cm that she is still seeing her pcp Dr [name] States she saw pcp today  States she is seen by a GI provider at [facility] and an Endocrinologist  Pt reports not having issues getting her medications nor taking them

## 2014-12-19 NOTE — ED Notes (Signed)
IV attempt unsuccessful. Marissa Calamity, RN will attempt IV insertion

## 2014-12-26 ENCOUNTER — Emergency Department (HOSPITAL_COMMUNITY)
Admission: EM | Admit: 2014-12-26 | Discharge: 2014-12-26 | Disposition: A | Discharge status: Home / Self Care | Payer: Medicaid Other | Attending: Emergency Medicine | Admitting: Emergency Medicine

## 2014-12-26 DIAGNOSIS — Z3202 Encounter for pregnancy test, result negative: Secondary | ICD-10-CM | POA: Diagnosis not present

## 2014-12-26 DIAGNOSIS — R109 Unspecified abdominal pain: Secondary | ICD-10-CM

## 2014-12-26 DIAGNOSIS — Z79899 Other long term (current) drug therapy: Secondary | ICD-10-CM | POA: Diagnosis not present

## 2014-12-26 DIAGNOSIS — D649 Anemia, unspecified: Secondary | ICD-10-CM | POA: Diagnosis not present

## 2014-12-26 DIAGNOSIS — E108 Type 1 diabetes mellitus with unspecified complications: Secondary | ICD-10-CM

## 2014-12-26 DIAGNOSIS — F419 Anxiety disorder, unspecified: Secondary | ICD-10-CM | POA: Insufficient documentation

## 2014-12-26 DIAGNOSIS — Z8619 Personal history of other infectious and parasitic diseases: Secondary | ICD-10-CM | POA: Diagnosis not present

## 2014-12-26 DIAGNOSIS — E1040 Type 1 diabetes mellitus with diabetic neuropathy, unspecified: Secondary | ICD-10-CM | POA: Insufficient documentation

## 2014-12-26 DIAGNOSIS — Z87891 Personal history of nicotine dependence: Secondary | ICD-10-CM | POA: Diagnosis not present

## 2014-12-26 DIAGNOSIS — K219 Gastro-esophageal reflux disease without esophagitis: Secondary | ICD-10-CM | POA: Diagnosis not present

## 2014-12-26 DIAGNOSIS — R112 Nausea with vomiting, unspecified: Secondary | ICD-10-CM | POA: Diagnosis not present

## 2014-12-26 DIAGNOSIS — Z794 Long term (current) use of insulin: Secondary | ICD-10-CM | POA: Diagnosis not present

## 2014-12-26 DIAGNOSIS — Z792 Long term (current) use of antibiotics: Secondary | ICD-10-CM | POA: Diagnosis not present

## 2014-12-26 DIAGNOSIS — Z91040 Latex allergy status: Secondary | ICD-10-CM | POA: Insufficient documentation

## 2014-12-26 LAB — COMPREHENSIVE METABOLIC PANEL
ALBUMIN: 4.6 g/dL (ref 3.5–5.0)
ALK PHOS: 72 U/L (ref 38–126)
ALT: 64 U/L — AB (ref 14–54)
AST: 51 U/L — ABNORMAL HIGH (ref 15–41)
Anion gap: 6 (ref 5–15)
BUN: 9 mg/dL (ref 6–20)
CALCIUM: 9.6 mg/dL (ref 8.9–10.3)
CO2: 31 mmol/L (ref 22–32)
CREATININE: 0.67 mg/dL (ref 0.44–1.00)
Chloride: 99 mmol/L — ABNORMAL LOW (ref 101–111)
GFR calc Af Amer: >60 mL/min (ref >60)
GFR calc non Af Amer: >60 mL/min (ref >60)
GLUCOSE: 336 mg/dL — AB (ref 65–99)
Potassium: 3.9 mmol/L (ref 3.5–5.1)
SODIUM: 136 mmol/L (ref 135–145)
Total Bilirubin: 0.3 mg/dL (ref 0.3–1.2)
Total Protein: 7.8 g/dL (ref 6.5–8.1)

## 2014-12-26 LAB — LIPASE, BLOOD: Lipase: 18 U/L (ref 11–51)

## 2014-12-26 LAB — CBC
HCT: 37.7 % (ref 36.0–46.0)
HEMOGLOBIN: 12.7 g/dL (ref 12.0–15.0)
MCH: 29.7 pg (ref 26.0–34.0)
MCHC: 33.7 g/dL (ref 30.0–36.0)
MCV: 88.3 fL (ref 78.0–100.0)
Platelets: 264 10*3/uL (ref 150–400)
RBC: 4.27 MIL/uL (ref 3.87–5.11)
RDW: 13 % (ref 11.5–15.5)
WBC: 5.4 10*3/uL (ref 4.0–10.5)

## 2014-12-26 LAB — I-STAT BETA HCG BLOOD, ED (MC, WL, AP ONLY): I-stat hCG, quantitative: <5 m[IU]/mL (ref <5)

## 2014-12-26 LAB — CBG MONITORING, ED: GLUCOSE-CAPILLARY: 310 mg/dL — AB (ref 65–99)

## 2014-12-26 MED ORDER — HYDROMORPHONE HCL 1 MG/ML IJ SOLN
1.0000 mg | Freq: Once | INTRAMUSCULAR | Status: AC
  Administered 2014-12-26: 1 mg via INTRAVENOUS
  Filled 2014-12-26: qty 1

## 2014-12-26 MED ORDER — METOCLOPRAMIDE HCL 5 MG/ML IJ SOLN
10.0000 mg | Freq: Once | INTRAMUSCULAR | Status: AC
  Administered 2014-12-26: 10 mg via INTRAVENOUS
  Filled 2014-12-26: qty 2

## 2014-12-26 MED ORDER — LORAZEPAM 2 MG/ML IJ SOLN
1.0000 mg | Freq: Once | INTRAMUSCULAR | Status: AC
  Administered 2014-12-26: 1 mg via INTRAVENOUS
  Filled 2014-12-26: qty 1

## 2014-12-26 MED ORDER — METOCLOPRAMIDE HCL 10 MG PO TABS: 10.0000 mg | ORAL_TABLET | Freq: Four times a day (QID) | ORAL | Status: DC

## 2014-12-26 MED ORDER — PROMETHAZINE HCL 25 MG RE SUPP: 25.0000 mg | Freq: Four times a day (QID) | RECTAL | Status: DC | PRN

## 2014-12-26 MED ORDER — SODIUM CHLORIDE 0.9 % IV BOLUS (SEPSIS)
2000.0000 mL | Freq: Once | INTRAVENOUS | Status: AC
Start: 2014-12-26 — End: 2014-12-26
  Administered 2014-12-26: 2000 mL via INTRAVENOUS

## 2014-12-26 NOTE — Progress Notes (Addendum)
Pt with 16 ED visits and 2 admissions in the last 6 months .   CM spoke with pt on 12/19/14 about her appts, medications Refer to 12/19/14 CM note CM spoke with Melissa and Dr Ralene Cork at [phone] to see if pt is compliant with f/u appts.  Melissa confirms pt is seen once a month Last seen 12/19/14 prior to her coming to [facility] ED. PCP staff voiced concern about possible etoh Cm noted no recent screening lately for etoh   Next appt on 01/14/15 2-3 pm Entered in EPIC  CM discussed this with EDP (pcp appts, etoh)  Pcp is not a [facility] participating provider

## 2014-12-26 NOTE — ED Provider Notes (Signed)
CSN: [id number]     Arrival date & time 12/26/14  0919 History   First MD Initiated Contact with Patient 12/26/14 [phone]     Chief Complaint  Patient presents with  . Abdominal Pain  . Nausea  . Emesis      HPI Patient has a history of recurrent abdominal pain as well as nausea vomiting.  She reports that she is a "brittle diabetic".  Frequent visits the emergency department for similar complaints.  She reports nonbloody vomit with associated nausea.  She has had some ongoing diarrhea as well.  She's concerned about the possibility of recurrent C. difficile K she states she's had C. difficile in the past.  No melena or hematochezia.  Decreased oral intake.  No other complaints.  Pain is moderate to severe in severity.   Past Medical History  Diagnosis Date  . DM type 1 causing complication ([place]) dx 6568    hyperglycemia +/- DKA + coma, severe hypoglycemia, gastroparesis, peripheral neuropathy  . Thyroid disease     hypothyroidism associated with pregnancy  . Back pain   . PONV (postoperative nausea and vomiting)   . Anxiety     hx panic attacks.   Marland Kitchen GERD (gastroesophageal reflux disease)   . DM gastroparesis ([place])   . Anemia     .Autoimmune hemolytic anemia. bone marrow bx 2008, Dr Marin Olp  . Colitis, Clostridium difficile 05/2014  . Fatty liver 01/2007    with hepatomegaly on CT scan.    Past Surgical History  Procedure Laterality Date  . Laparoscopy  02/2002, 06/2010    laparoscopy with lysis pelvic adhesions for pelvic pain  . Back surgery      age 40  . Tooth extraction    . Root canal  10/10/12  . Flexible sigmoidoscopy N/A 06/24/2014    Procedure: FLEXIBLE SIGMOIDOSCOPY;  Surgeon: Milus Banister, MD;  Location: [facility];  Service: Endoscopy;  Laterality: N/A;  . Esophagogastroduodenoscopy (egd) with propofol N/A 06/27/2014    Procedure: ESOPHAGOGASTRODUODENOSCOPY (EGD) WITH PROPOFOL;  Surgeon: Milus Banister, MD;  Location: [facility];  Service: Endoscopy;  Laterality:  N/A;   Family History  Problem Relation Age of Onset  . Diabetes Maternal Grandmother   . Cancer Maternal Grandmother     Leukemia  . Cancer Maternal Grandfather     Small cell lung cancer   Social History  Substance Use Topics  . Smoking status: Former Smoker    Quit date: 04/19/2000  . Smokeless tobacco: Former Systems developer  . Alcohol Use: No     Comment: RARE SIPS OF WINE   OB History    No data available     Review of Systems  All other systems reviewed and are negative.     Allergies  Scallops; Haloperidol and related; Morphine and related; Toradol; Latex; and Other  Home Medications   Prior to Admission medications   Medication Sig Start Date End Date Taking? Authorizing Provider  acetaminophen (TYLENOL) 500 MG tablet Take 1,000 mg by mouth every 6 (six) hours as needed for moderate pain.    Yes Historical Provider, MD  ALPRAZolam Duanne Moron) 1 MG tablet Take 2 mg by mouth 2 (two) times daily as needed for anxiety.    Yes Historical Provider, MD  amitriptyline (ELAVIL) 50 MG tablet Take 2 tablets (100 mg total) by mouth at bedtime. 08/07/14  Yes Arnoldo Morale, MD  CALCIUM PO Take 2 tablets by mouth daily.   Yes Historical Provider, MD  cholestyramine Lucrezia Starch) 4  G packet Take 1 packet (4 g total) by mouth 2 (two) times daily. Patient taking differently: Take 4 g by mouth 4 (four) times daily.  09/19/14  Yes Jeffrey Hedges, PA-C  CREON 36000 UNITS CPEP capsule Take 4-6 capsules by mouth. Takes 6 capsules with every meal and 4 capsules with every snack. 12/02/14  Yes Historical Provider, MD  cyanocobalamin 500 MCG tablet Take 2,000 mcg by mouth 2 (two) times daily as needed (mouth sores).    Yes Historical Provider, MD  dicyclomine (BENTYL) 10 MG capsule Take 10 mg by mouth 4 (four) times daily -  before meals and at bedtime.   Yes Historical Provider, MD  diphenoxylate-atropine (LOMOTIL) 2.5-0.025 MG per tablet Take 1 tablet by mouth 4 (four) times daily as needed for diarrhea or  loose stools. 11/08/14  Yes Orpah Greek, MD  feeding supplement, GLUCERNA SHAKE, (GLUCERNA SHAKE) LIQD Take 237 mLs by mouth 3 (three) times daily between meals. Patient taking differently: Take 237 mLs by mouth daily.  07/06/14  Yes Thurnell Lose, MD  gabapentin (NEURONTIN) 100 MG capsule Take 300 mg by mouth at bedtime.    Yes Historical Provider, MD  glucagon (GLUCAGON EMERGENCY) 1 MG injection Inject 1 mg into the muscle once as needed (severe hypoglycemia). 08/14/14  Yes Arnoldo Morale, MD  insulin aspart (NOVOLOG FLEXPEN) 100 UNIT/ML FlexPen Inject 1-5 Units into the skin 3 (three) times daily with meals.   Yes Historical Provider, MD  insulin glargine (LANTUS) 100 UNIT/ML injection Inject 0.2 mLs (20 Units total) into the skin every morning. Patient taking differently: Inject 7-8 Units into the skin 2 (two) times daily. 8 units in the morning and 7 units at night 08/07/14  Yes Arnoldo Morale, MD  Multiple Vitamin (MULTIVITAMIN WITH MINERALS) TABS Take 2 tablets by mouth daily. Diabetic support vitamin   Yes Historical Provider, MD  omeprazole (PRILOSEC) 40 MG capsule Take 40 mg by mouth daily as needed (indigestion).    Yes Historical Provider, MD  ondansetron (ZOFRAN ODT) 4 MG disintegrating tablet 4mg  ODT q4 hours prn nausea/vomit 11/28/14  Yes Milton Ferguson, MD  oxyCODONE-acetaminophen (PERCOCET) 10-325 MG tablet Take 0.5 tablets by mouth every 6 (six) hours as needed for pain.    Yes Historical Provider, MD  Potassium (POTASSIMIN PO) Take 2 capsules by mouth daily.   Yes Historical Provider, MD  Probiotic Product (DIGESTIVE ADVANTAGE GUMMIES PO) Take 2 tablets by mouth 2 (two) times daily.   Yes Historical Provider, MD  saccharomyces boulardii (FLORASTOR) 250 MG capsule Take 1 capsule (250 mg total) by mouth 2 (two) times daily. 07/06/14  Yes Thurnell Lose, MD  vancomycin (VANCOCIN) 125 MG capsule Take 1 capsule by mouth every 6 (six) hours. 12/06/14  Yes Historical Provider, MD   HUMALOG 100 UNIT/ML cartridge Inject 5-15 Units as directed 3 (three) times daily. 11/12/14   Historical Provider, MD  insulin aspart (NOVOLOG) 100 UNIT/ML injection Inject 0-5 Units into the skin 3 (three) times daily with meals. Patient not taking: Reported on 12/26/2014 08/01/14   Silver Huguenin Elgergawy, MD  Lactobacillus Esau Grew) PACK Mix 1/2 packet with soft food; take twice a day for 5 days Patient not taking: Reported on 12/26/2014 11/18/14   Antonietta Breach, PA-C  metoCLOPramide (REGLAN) 10 MG tablet Take 1 tablet (10 mg total) by mouth every 6 (six) hours. 12/26/14   Jola Schmidt, MD  promethazine (PHENERGAN) 25 MG suppository Place 1 suppository (25 mg total) rectally every 6 (six) hours as needed for  nausea or vomiting. 12/26/14   Jola Schmidt, MD   BP 103/67 mmHg  Pulse 88  Temp(Src) 97 F (36.1 C) (Oral)  Resp 16  SpO2 100% Physical Exam  Constitutional: She is oriented to person, place, and time. She appears well-developed and well-nourished. No distress.  HENT:  Head: Normocephalic and atraumatic.  Eyes: EOM are normal.  Neck: Normal range of motion.  Cardiovascular: Normal rate, regular rhythm and normal heart sounds.   Pulmonary/Chest: Effort normal and breath sounds normal.  Abdominal: Soft. She exhibits no distension. There is no tenderness.  Musculoskeletal: Normal range of motion.  Neurological: She is alert and oriented to person, place, and time.  Skin: Skin is warm and dry.  Psychiatric: She has a normal mood and affect. Judgment normal.  Nursing note and vitals reviewed.   ED Course  Procedures (including critical care time) Labs Review Labs Reviewed  COMPREHENSIVE METABOLIC PANEL - Abnormal; Notable for the following:    Chloride 99 (*)    Glucose, Bld 336 (*)    AST 51 (*)    ALT 64 (*)    All other components within normal limits  CBG MONITORING, ED - Abnormal; Notable for the following:    Glucose-Capillary 310 (*)    All other components within normal  limits  CBC  LIPASE, BLOOD  I-STAT BETA HCG BLOOD, ED ([facility], [facility], [facility] ONLY)    Imaging Review No results found. I have personally reviewed and evaluated these images and lab results as part of my medical decision-making.   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  Labs without significant abnormality.  Hyperglycemia noted.  No signs of DKA.  Discharge home in good condition.  Primary care follow-up.  Home with antinausea medication.  Patient's primary care doctor is contacted by my case management team and she has scheduled follow-up.  Her primary care physician is concerned about the possibility of ongoing alcohol abuse.  Patient denies ongoing alcohol abuse.  This is likely diabetic gastroparesis is best managed as an outpatient.    Jola Schmidt, MD 12/26/14 [phone]

## 2014-12-26 NOTE — ED Notes (Signed)
Pt was in the ER on 10/27 and was supposed to be admitted but she couldn't stay due to childcare issues.  Pt states that she has had continued gen abd pain, NVD.  Last CBG at home 320.

## 2015-01-03 ENCOUNTER — Encounter (HOSPITAL_COMMUNITY): Payer: Self-pay | Admitting: Emergency Medicine

## 2015-01-03 ENCOUNTER — Emergency Department (HOSPITAL_COMMUNITY)
Admission: EM | Admit: 2015-01-03 | Discharge: 2015-01-03 | Discharge status: Against Medical Advice | Payer: Medicaid Other | Attending: Emergency Medicine | Admitting: Emergency Medicine

## 2015-01-03 DIAGNOSIS — R197 Diarrhea, unspecified: Secondary | ICD-10-CM | POA: Diagnosis not present

## 2015-01-03 DIAGNOSIS — R112 Nausea with vomiting, unspecified: Secondary | ICD-10-CM | POA: Diagnosis present

## 2015-01-03 DIAGNOSIS — E1043 Type 1 diabetes mellitus with diabetic autonomic (poly)neuropathy: Secondary | ICD-10-CM | POA: Insufficient documentation

## 2015-01-03 DIAGNOSIS — R1084 Generalized abdominal pain: Secondary | ICD-10-CM | POA: Diagnosis not present

## 2015-01-03 LAB — CBG MONITORING, ED
GLUCOSE-CAPILLARY: 279 mg/dL — AB (ref 65–99)
Glucose-Capillary: 558 mg/dL (ref 65–99)

## 2015-01-03 NOTE — ED Notes (Signed)
unsuccessful blood draw, pt request blood draw during start of IV

## 2015-01-03 NOTE — ED Notes (Signed)
Pt states that she was last here on the third.  Was going to be admitted but had childcare issues.  Pt states that she has been having worsening gen abd pain with NVD.  Pt last CBG at home was "2-something"

## 2015-01-03 NOTE — ED Notes (Signed)
Pt reported to Registration that she is leaving, due to needing to take her insulin.

## 2015-01-04 ENCOUNTER — Emergency Department (HOSPITAL_COMMUNITY)
Admission: EM | Admit: 2015-01-04 | Discharge: 2015-01-04 | Disposition: A | Discharge status: Home / Self Care | Payer: Medicaid Other | Attending: Emergency Medicine | Admitting: Emergency Medicine

## 2015-01-04 ENCOUNTER — Encounter (HOSPITAL_COMMUNITY): Payer: Self-pay

## 2015-01-04 DIAGNOSIS — Z87891 Personal history of nicotine dependence: Secondary | ICD-10-CM | POA: Insufficient documentation

## 2015-01-04 DIAGNOSIS — G8929 Other chronic pain: Secondary | ICD-10-CM | POA: Diagnosis not present

## 2015-01-04 DIAGNOSIS — Z91040 Latex allergy status: Secondary | ICD-10-CM | POA: Diagnosis not present

## 2015-01-04 DIAGNOSIS — R197 Diarrhea, unspecified: Secondary | ICD-10-CM | POA: Diagnosis not present

## 2015-01-04 DIAGNOSIS — Z79899 Other long term (current) drug therapy: Secondary | ICD-10-CM | POA: Diagnosis not present

## 2015-01-04 DIAGNOSIS — F419 Anxiety disorder, unspecified: Secondary | ICD-10-CM | POA: Diagnosis not present

## 2015-01-04 DIAGNOSIS — Z794 Long term (current) use of insulin: Secondary | ICD-10-CM | POA: Insufficient documentation

## 2015-01-04 DIAGNOSIS — D649 Anemia, unspecified: Secondary | ICD-10-CM | POA: Diagnosis not present

## 2015-01-04 DIAGNOSIS — E1043 Type 1 diabetes mellitus with diabetic autonomic (poly)neuropathy: Secondary | ICD-10-CM | POA: Diagnosis not present

## 2015-01-04 DIAGNOSIS — Z8619 Personal history of other infectious and parasitic diseases: Secondary | ICD-10-CM | POA: Diagnosis not present

## 2015-01-04 DIAGNOSIS — K909 Intestinal malabsorption, unspecified: Secondary | ICD-10-CM | POA: Diagnosis not present

## 2015-01-04 DIAGNOSIS — K219 Gastro-esophageal reflux disease without esophagitis: Secondary | ICD-10-CM | POA: Insufficient documentation

## 2015-01-04 DIAGNOSIS — R110 Nausea: Secondary | ICD-10-CM | POA: Diagnosis not present

## 2015-01-04 DIAGNOSIS — K3184 Gastroparesis: Secondary | ICD-10-CM | POA: Diagnosis not present

## 2015-01-04 DIAGNOSIS — R109 Unspecified abdominal pain: Secondary | ICD-10-CM | POA: Diagnosis present

## 2015-01-04 LAB — URINALYSIS, ROUTINE W REFLEX MICROSCOPIC
Bilirubin Urine: NEGATIVE
HGB URINE DIPSTICK: NEGATIVE
Ketones, ur: NEGATIVE mg/dL
Leukocytes, UA: NEGATIVE
Nitrite: NEGATIVE
PROTEIN: NEGATIVE mg/dL
Specific Gravity, Urine: 1.045 — ABNORMAL HIGH (ref 1.005–1.030)
Urobilinogen, UA: 0.2 mg/dL (ref 0.0–1.0)
pH: 5.5 (ref 5.0–8.0)

## 2015-01-04 LAB — COMPREHENSIVE METABOLIC PANEL
ALBUMIN: 4.5 g/dL (ref 3.5–5.0)
ALK PHOS: 85 U/L (ref 38–126)
ALT: 48 U/L (ref 14–54)
ANION GAP: 9 (ref 5–15)
AST: 30 U/L (ref 15–41)
BILIRUBIN TOTAL: 0.5 mg/dL (ref 0.3–1.2)
BUN: 10 mg/dL (ref 6–20)
CALCIUM: 9.3 mg/dL (ref 8.9–10.3)
CO2: 27 mmol/L (ref 22–32)
CREATININE: 0.62 mg/dL (ref 0.44–1.00)
Chloride: 98 mmol/L — ABNORMAL LOW (ref 101–111)
GFR calc Af Amer: >60 mL/min (ref >60)
GFR calc non Af Amer: >60 mL/min (ref >60)
GLUCOSE: 367 mg/dL — AB (ref 65–99)
Potassium: 3.9 mmol/L (ref 3.5–5.1)
SODIUM: 134 mmol/L — AB (ref 135–145)
Total Protein: 7.8 g/dL (ref 6.5–8.1)

## 2015-01-04 LAB — CBC
HCT: 35.3 % — ABNORMAL LOW (ref 36.0–46.0)
HEMOGLOBIN: 12 g/dL (ref 12.0–15.0)
MCH: 29.7 pg (ref 26.0–34.0)
MCHC: 34 g/dL (ref 30.0–36.0)
MCV: 87.4 fL (ref 78.0–100.0)
Platelets: 200 10*3/uL (ref 150–400)
RBC: 4.04 MIL/uL (ref 3.87–5.11)
RDW: 12.7 % (ref 11.5–15.5)
WBC: 6.5 10*3/uL (ref 4.0–10.5)

## 2015-01-04 LAB — CBG MONITORING, ED: Glucose-Capillary: 359 mg/dL — ABNORMAL HIGH (ref 65–99)

## 2015-01-04 LAB — URINE MICROSCOPIC-ADD ON

## 2015-01-04 LAB — LIPASE, BLOOD: Lipase: 15 U/L (ref 11–51)

## 2015-01-04 MED ORDER — HYDROMORPHONE HCL 1 MG/ML IJ SOLN
0.5000 mg | Freq: Once | INTRAMUSCULAR | Status: AC
  Administered 2015-01-04: 0.5 mg via INTRAVENOUS
  Filled 2015-01-04: qty 1

## 2015-01-04 MED ORDER — PROMETHAZINE HCL 25 MG RE SUPP: 25.0000 mg | Freq: Four times a day (QID) | RECTAL | Status: DC | PRN

## 2015-01-04 MED ORDER — SODIUM CHLORIDE 0.9 % IV BOLUS (SEPSIS)
1000.0000 mL | Freq: Once | INTRAVENOUS | Status: AC
  Administered 2015-01-04: 1000 mL via INTRAVENOUS

## 2015-01-04 MED ORDER — METOCLOPRAMIDE HCL 5 MG/ML IJ SOLN
10.0000 mg | Freq: Once | INTRAMUSCULAR | Status: AC
  Administered 2015-01-04: 10 mg via INTRAVENOUS
  Filled 2015-01-04: qty 2

## 2015-01-04 NOTE — ED Notes (Signed)
She c/o persistent diarrhea (only diarrhea stools since Dec. Of 2015); plus recent abd. Pain and wt. Loss.  She tells me she is a type I diabetic.

## 2015-01-04 NOTE — Discharge Instructions (Signed)
Chronic Diarrhea Diarrhea is frequent loose and watery bowel movements. It can cause you to feel weak and dehydrated. Dehydration can cause you to become tired and thirsty and to have a dry mouth, decreased urination, and dark yellow urine. Diarrhea is a sign of another problem, most often an infection that will not last long. In most cases, diarrhea lasts 2-3 days. Diarrhea that lasts longer than 4 weeks is called long-lasting (chronic) diarrhea. It is important to treat your diarrhea as directed by your health care provider to lessen or prevent future episodes of diarrhea.  CAUSES  There are many causes of chronic diarrhea. The following are some possible causes:   Gastrointestinal infections caused by viruses, bacteria, or parasites.   Food poisoning or food allergies.   Certain medicines, such as antibiotics, chemotherapy, and laxatives.   Artificial sweeteners and fructose.   Digestive disorders, such as celiac disease and inflammatory bowel diseases.   Irritable bowel syndrome.  Some disorders of the pancreas.  Disorders of the thyroid.  Reduced blood flow to the intestines.  Cancer. Sometimes the cause of chronic diarrhea is unknown. RISK FACTORS  Having a severely weakened immune system, such as from HIV or AIDS.   Taking certain types of cancer-fighting drugs (such as with chemotherapy) or other medicines.   Having had a recent organ transplant.   Having a portion of the stomach or small bowel removed.   Traveling to countries where food and water supplies are often contaminated.  SYMPTOMS  In addition to frequent, loose stools, diarrhea may cause:   Cramping.   Abdominal pain.   Nausea.   Fever.  Fatigue.  Urgent need to use the bathroom.  Loss of bowel control. DIAGNOSIS  Your health care provider must take a careful history and perform a physical exam. Tests given are based on your symptoms and history. Tests may include:   Blood or  stool tests. Three or more stool samples may be examined. Stool cultures may be used to test for bacteria or parasites.   X-rays.   A procedure in which a thin tube is inserted into the mouth or rectum (endoscopy). This allows the health care provider to look inside the intestine.  TREATMENT   Treatment is aimed at correcting the cause of the diarrhea when possible.  Diarrhea caused by an infection can often be treated with antibiotic medicines.  Diarrhea not caused by an infection may require you to take long-term medicine or have surgery. Specific treatment should be discussed with your health care provider.  If the cause cannot be determined, treatment aims to relieve symptoms and prevent dehydration. Serious health problems can occur if you do not maintain proper fluid levels. Treatment may include:  Taking an oral rehydration solution (ORS).  Not drinking beverages that contain caffeine (such as tea, coffee, and soft drinks).  Not drinking alcohol.  Maintaining well-balanced nutrition to help you recover faster. HOME CARE INSTRUCTIONS   Drink enough fluids to keep urine clear or pale yellow. Drink 1 cup (8 oz) of fluid for each diarrhea episode. Avoid fluids that contain simple sugars, fruit juices, whole milk products, and sodas. Hydrate with an ORS. You may purchase the ORS or prepare it at home by mixing the following ingredients together:   - tsp (1.7-3  mL) table salt.   tsp (3  mL) baking soda.   tsp (1.7 mL) salt substitute containing potassium chloride.  1 tbsp (20 mL) sugar.  4.2 c (1 L) of water.     Certain foods and beverages may increase the speed at which food moves through the gastrointestinal (GI) tract. These foods and beverages should be avoided. They include:  Caffeinated and alcoholic beverages.  High-fiber foods, such as raw fruits and vegetables, nuts, seeds, and whole grain breads and cereals.  Foods and beverages sweetened with sugar  alcohols, such as xylitol, sorbitol, and mannitol.   Some foods may be well tolerated and may help thicken stool. These include:  Starchy foods, such as rice, toast, pasta, low-sugar cereal, oatmeal, grits, baked potatoes, crackers, and bagels.  Bananas.  Applesauce.  Add probiotic-rich foods to help increase healthy bacteria in the GI tract. These include yogurt and fermented milk products.  Wash your hands well after each diarrhea episode.  Only take over-the-counter or prescription medicines as directed by your health care provider.  Take a warm bath to relieve any burning or pain from frequent diarrhea episodes. SEEK MEDICAL CARE IF:   You are not urinating as often.  Your urine is a dark color.  You become very tired or dizzy.  You have severe pain in the abdomen or rectum.  Your have blood or pus in your stools.  Your stools look black and tarry. SEEK IMMEDIATE MEDICAL CARE IF:   You are unable to keep fluids down.  You have persistent vomiting.  You have blood in your stool.  Your stools are black and tarry.  You do not urinate in 6-8 hours, or there is only a small amount of very dark urine.  You have abdominal pain that increases or localizes.  You have weakness, dizziness, confusion, or lightheadedness.  You have a severe headache.  Your diarrhea gets worse or does not get better.  You have a fever or persistent symptoms for more than 2-3 days.  You have a fever and your symptoms suddenly get worse. MAKE SURE YOU:   Understand these instructions.  Will watch your condition.  Will get help right away if you are not doing well or get worse.   This information is not intended to replace advice given to you by your health care provider. Make sure you discuss any questions you have with your health care provider.   Document Released: 05/01/2003 Document Revised: 02/13/2013 Document Reviewed: 08/03/2012 Elsevier Interactive Patient Education 2016  [employer]  

## 2015-01-04 NOTE — ED Provider Notes (Signed)
CSN: [id number]     Arrival date & time 01/04/15  1409 History   First MD Initiated Contact with Patient 01/04/15 1540     Chief Complaint  Patient presents with  . Abdominal Pain     (Consider location/radiation/quality/duration/timing/severity/associated sxs/prior Treatment) Patient is a 34 y.o. female presenting with general illness. The history is provided by the patient.  Illness Severity:  Severe Onset quality:  Gradual Duration: many years. Timing:  Constant Progression:  Waxing and waning Chronicity:  Chronic Associated symptoms: abdominal pain, diarrhea and nausea   Associated symptoms: no chest pain, no congestion, no fever, no headaches, no myalgias, no rhinorrhea, no shortness of breath, no vomiting and no wheezing    34 yo F with a chief complaint of chronic abdominal pain and diarrhea. This been going on for at least a couple years. Patient has been seen by her endocrinologist multiple times as well as her family doctor for the same. Patient is also been followed by GI for this. Has a history of type 1 diabetes and gastroparesis. Patient doesn't feel like her pain is significant weight change but feels like she's been unable to eat and drink as much over the past 24 hours that she is dehydrated. Patient denies any fevers or chills. Denies any change in her diarrhea. Denies change in her abdominal pain. Abdominal pain is over her entire abdomen is crampy. Relieved with bowel movement.  Past Medical History  Diagnosis Date  . DM type 1 causing complication ([place]) dx [id number]    hyperglycemia +/- DKA + coma, severe hypoglycemia, gastroparesis, peripheral neuropathy  . Thyroid disease     hypothyroidism associated with pregnancy  . Back pain   . PONV (postoperative nausea and vomiting)   . Anxiety     hx panic attacks.   Marland Kitchen GERD (gastroesophageal reflux disease)   . DM gastroparesis ([place])   . Anemia     .Autoimmune hemolytic anemia. bone marrow bx 2008, Dr Marin Olp  .  Colitis, Clostridium difficile 05/2014  . Fatty liver 01/2007    with hepatomegaly on CT scan.    Past Surgical History  Procedure Laterality Date  . Laparoscopy  02/2002, 06/2010    laparoscopy with lysis pelvic adhesions for pelvic pain  . Back surgery      age 32  . Tooth extraction    . Root canal  10/10/12  . Flexible sigmoidoscopy N/A 06/24/2014    Procedure: FLEXIBLE SIGMOIDOSCOPY;  Surgeon: Milus Banister, MD;  Location: [facility];  Service: Endoscopy;  Laterality: N/A;  . Esophagogastroduodenoscopy (egd) with propofol N/A 06/27/2014    Procedure: ESOPHAGOGASTRODUODENOSCOPY (EGD) WITH PROPOFOL;  Surgeon: Milus Banister, MD;  Location: [facility];  Service: Endoscopy;  Laterality: N/A;   Family History  Problem Relation Age of Onset  . Diabetes Maternal Grandmother   . Cancer Maternal Grandmother     Leukemia  . Cancer Maternal Grandfather     Small cell lung cancer   Social History  Substance Use Topics  . Smoking status: Former Smoker    Quit date: 04/19/2000  . Smokeless tobacco: Former Systems developer  . Alcohol Use: No     Comment: RARE SIPS OF WINE   OB History    No data available     Review of Systems  Constitutional: Negative for fever and chills.  HENT: Negative for congestion and rhinorrhea.   Eyes: Negative for redness and visual disturbance.  Respiratory: Negative for shortness of breath and wheezing.   Cardiovascular: Negative  for chest pain and palpitations.  Gastrointestinal: Positive for nausea, abdominal pain and diarrhea. Negative for vomiting.  Genitourinary: Negative for dysuria and urgency.  Musculoskeletal: Negative for myalgias and arthralgias.  Skin: Negative for pallor and wound.  Neurological: Negative for dizziness and headaches.      Allergies  Scallops; Haloperidol and related; Morphine and related; Toradol; Latex; and Other  Home Medications   Prior to Admission medications   Medication Sig Start Date End Date Taking? Authorizing  Provider  acetaminophen (TYLENOL) 500 MG tablet Take 1,000 mg by mouth every 6 (six) hours as needed for moderate pain.    Yes Historical Provider, MD  ALPRAZolam Duanne Moron) 1 MG tablet Take 2 mg by mouth 2 (two) times daily as needed for anxiety.    Yes Historical Provider, MD  amitriptyline (ELAVIL) 50 MG tablet Take 2 tablets (100 mg total) by mouth at bedtime. 08/07/14  Yes Arnoldo Morale, MD  CALCIUM PO Take 2 tablets by mouth daily.   Yes Historical Provider, MD  cholestyramine Lucrezia Starch) 4 G packet Take 1 packet (4 g total) by mouth 2 (two) times daily. Patient taking differently: Take 4 g by mouth 4 (four) times daily.  09/19/14  Yes Jeffrey Hedges, PA-C  CREON 36000 UNITS CPEP capsule Take 4-6 capsules by mouth. Takes 6 capsules with every meal and 4 capsules with every snack. 12/02/14  Yes Historical Provider, MD  cyanocobalamin 500 MCG tablet Take 2,000 mcg by mouth 2 (two) times daily as needed (mouth sores).    Yes Historical Provider, MD  dicyclomine (BENTYL) 10 MG capsule Take 10 mg by mouth 4 (four) times daily -  before meals and at bedtime.   Yes Historical Provider, MD  diphenoxylate-atropine (LOMOTIL) 2.5-0.025 MG per tablet Take 1 tablet by mouth 4 (four) times daily as needed for diarrhea or loose stools. 11/08/14  Yes Orpah Greek, MD  feeding supplement, GLUCERNA SHAKE, (GLUCERNA SHAKE) LIQD Take 237 mLs by mouth 3 (three) times daily between meals. Patient taking differently: Take 237 mLs by mouth daily.  07/06/14  Yes Thurnell Lose, MD  gabapentin (NEURONTIN) 100 MG capsule Take 300 mg by mouth at bedtime.    Yes Historical Provider, MD  glucagon (GLUCAGON EMERGENCY) 1 MG injection Inject 1 mg into the muscle once as needed (severe hypoglycemia). 08/14/14  Yes Arnoldo Morale, MD  insulin aspart (NOVOLOG FLEXPEN) 100 UNIT/ML FlexPen Inject 1-5 Units into the skin 3 (three) times daily with meals.   Yes Historical Provider, MD  insulin glargine (LANTUS) 100 UNIT/ML injection  Inject 0.2 mLs (20 Units total) into the skin every morning. Patient taking differently: Inject 7-8 Units into the skin 2 (two) times daily. 8 units in the morning and 7 units at night 08/07/14  Yes Arnoldo Morale, MD  metoCLOPramide (REGLAN) 10 MG tablet Take 1 tablet (10 mg total) by mouth every 6 (six) hours. Patient taking differently: Take 10 mg by mouth every 6 (six) hours as needed for nausea or vomiting.  12/26/14  Yes Jola Schmidt, MD  Multiple Vitamin (MULTIVITAMIN WITH MINERALS) TABS Take 2 tablets by mouth daily. Diabetic support vitamin   Yes Historical Provider, MD  omeprazole (PRILOSEC) 40 MG capsule Take 40 mg by mouth daily as needed (indigestion).    Yes Historical Provider, MD  ondansetron (ZOFRAN ODT) 4 MG disintegrating tablet 4mg  ODT q4 hours prn nausea/vomit 11/28/14  Yes Milton Ferguson, MD  oxyCODONE-acetaminophen (PERCOCET) 10-325 MG tablet Take 0.5 tablets by mouth every 6 (six) hours as  needed for pain.    Yes Historical Provider, MD  Potassium (POTASSIMIN PO) Take 2 capsules by mouth daily.   Yes Historical Provider, MD  Probiotic Product (DIGESTIVE ADVANTAGE GUMMIES PO) Take 2 tablets by mouth 2 (two) times daily.   Yes Historical Provider, MD  promethazine (PHENERGAN) 25 MG suppository Place 1 suppository (25 mg total) rectally every 6 (six) hours as needed for nausea or vomiting. 12/26/14  Yes Jola Schmidt, MD  saccharomyces boulardii (FLORASTOR) 250 MG capsule Take 1 capsule (250 mg total) by mouth 2 (two) times daily. 07/06/14  Yes Thurnell Lose, MD  HUMALOG 100 UNIT/ML cartridge Inject 5-15 Units as directed 3 (three) times daily. 11/12/14   Historical Provider, MD  insulin aspart (NOVOLOG) 100 UNIT/ML injection Inject 0-5 Units into the skin 3 (three) times daily with meals. Patient not taking: Reported on 12/26/2014 08/01/14   Silver Huguenin Elgergawy, MD  Lactobacillus Esau Grew) PACK Mix 1/2 packet with soft food; take twice a day for 5 days Patient not taking: Reported on  12/26/2014 11/18/14   Antonietta Breach, PA-C   BP 108/76 mmHg  Pulse 91  Temp(Src) 98.4 F (36.9 C) (Temporal)  Resp 18  SpO2 100% Physical Exam  Constitutional: She is oriented to person, place, and time. She appears well-developed and well-nourished. No distress.  HENT:  Head: Normocephalic and atraumatic.  Eyes: EOM are normal. Pupils are equal, round, and reactive to light.  Neck: Normal range of motion. Neck supple.  Cardiovascular: Normal rate and regular rhythm.  Exam reveals no gallop and no friction rub.   No murmur heard. Pulmonary/Chest: Effort normal. She has no wheezes. She has no rales.  Abdominal: Soft. She exhibits no distension. There is tenderness (diffuse with no focality). There is no rebound and no guarding.  Musculoskeletal: She exhibits no edema or tenderness.  Neurological: She is alert and oriented to person, place, and time.  Skin: Skin is warm and dry. She is not diaphoretic.  Psychiatric: She has a normal mood and affect. Her behavior is normal.  Nursing note and vitals reviewed.   ED Course  Procedures (including critical care time) Labs Review Labs Reviewed  COMPREHENSIVE METABOLIC PANEL - Abnormal; Notable for the following:    Sodium 134 (*)    Chloride 98 (*)    Glucose, Bld 367 (*)    All other components within normal limits  CBC - Abnormal; Notable for the following:    HCT 35.3 (*)    All other components within normal limits  URINALYSIS, ROUTINE W REFLEX MICROSCOPIC (NOT AT [facility]) - Abnormal; Notable for the following:    Specific Gravity, Urine 1.045 (*)    Glucose, UA >1000 (*)    All other components within normal limits  CBG MONITORING, ED - Abnormal; Notable for the following:    Glucose-Capillary 359 (*)    All other components within normal limits  LIPASE, BLOOD  URINE MICROSCOPIC-ADD ON    Imaging Review No results found. I have personally reviewed and evaluated these images and lab results as part of my medical  decision-making.   EKG Interpretation None      MDM   Final diagnoses:  Diarrhea due to malabsorption    34 yo F with a chief complaint of chronic abdominal pain and diarrhea. Well-appearing and nontoxic on exam. Cachectic likely chronically so. Labs were evaluation unremarkable. Not in DKA. No anion gap no ketones. Will give the patient IV fluids nausea medicine pain medicine. We'll have her follow-up with  her chronic doctors.  5:54 PM:  I have discussed the diagnosis/risks/treatment options with the patient and family and believe the pt to be eligible for discharge home to follow-up with PCP. We also discussed returning to the ED immediately if new or worsening sx occur. We discussed the sx which are most concerning (e.g., sudden worsening pain, fever, inability to tolerate by mouth) that necessitate immediate return. Medications administered to the patient during their visit and any new prescriptions provided to the patient are listed below.  Medications given during this visit Medications  sodium chloride 0.9 % bolus 1,000 mL (0 mLs Intravenous Stopped 01/04/15 1737)  metoCLOPramide (REGLAN) injection 10 mg (10 mg Intravenous Given 01/04/15 1622)  HYDROmorphone (DILAUDID) injection 0.5 mg (0.5 mg Intravenous Given 01/04/15 1622)    New Prescriptions   No medications on file    The patient appears reasonably screen and/or stabilized for discharge and I doubt any other medical condition or other [facility] requiring further screening, evaluation, or treatment in the ED at this time prior to discharge.    Deno Etienne, DO 01/04/15 1754

## 2015-01-09 ENCOUNTER — Encounter (HOSPITAL_COMMUNITY): Payer: Self-pay

## 2015-01-09 ENCOUNTER — Emergency Department (HOSPITAL_COMMUNITY)
Admission: EM | Admit: 2015-01-09 | Discharge: 2015-01-10 | Disposition: A | Discharge status: Home / Self Care | Payer: Medicaid Other | Source: Home / Self Care | Attending: Emergency Medicine | Admitting: Emergency Medicine

## 2015-01-09 DIAGNOSIS — F419 Anxiety disorder, unspecified: Secondary | ICD-10-CM

## 2015-01-09 DIAGNOSIS — Z91040 Latex allergy status: Secondary | ICD-10-CM | POA: Insufficient documentation

## 2015-01-09 DIAGNOSIS — Z794 Long term (current) use of insulin: Secondary | ICD-10-CM

## 2015-01-09 DIAGNOSIS — K219 Gastro-esophageal reflux disease without esophagitis: Secondary | ICD-10-CM

## 2015-01-09 DIAGNOSIS — Z87891 Personal history of nicotine dependence: Secondary | ICD-10-CM

## 2015-01-09 DIAGNOSIS — R1084 Generalized abdominal pain: Secondary | ICD-10-CM | POA: Insufficient documentation

## 2015-01-09 DIAGNOSIS — Z79899 Other long term (current) drug therapy: Secondary | ICD-10-CM

## 2015-01-09 DIAGNOSIS — Z862 Personal history of diseases of the blood and blood-forming organs and certain disorders involving the immune mechanism: Secondary | ICD-10-CM

## 2015-01-09 DIAGNOSIS — R197 Diarrhea, unspecified: Secondary | ICD-10-CM

## 2015-01-09 DIAGNOSIS — E1065 Type 1 diabetes mellitus with hyperglycemia: Secondary | ICD-10-CM

## 2015-01-09 DIAGNOSIS — E1043 Type 1 diabetes mellitus with diabetic autonomic (poly)neuropathy: Secondary | ICD-10-CM

## 2015-01-09 DIAGNOSIS — R739 Hyperglycemia, unspecified: Secondary | ICD-10-CM

## 2015-01-09 DIAGNOSIS — Z8619 Personal history of other infectious and parasitic diseases: Secondary | ICD-10-CM | POA: Insufficient documentation

## 2015-01-09 LAB — COMPREHENSIVE METABOLIC PANEL
ALK PHOS: 77 U/L (ref 38–126)
ALT: 46 U/L (ref 14–54)
ANION GAP: 8 (ref 5–15)
AST: 44 U/L — ABNORMAL HIGH (ref 15–41)
Albumin: 4.3 g/dL (ref 3.5–5.0)
BILIRUBIN TOTAL: 0.5 mg/dL (ref 0.3–1.2)
BUN: 6 mg/dL (ref 6–20)
CALCIUM: 9.3 mg/dL (ref 8.9–10.3)
CO2: 29 mmol/L (ref 22–32)
CREATININE: 0.86 mg/dL (ref 0.44–1.00)
Chloride: 92 mmol/L — ABNORMAL LOW (ref 101–111)
GFR calc non Af Amer: >60 mL/min (ref >60)
GLUCOSE: 769 mg/dL — AB (ref 65–99)
Potassium: 4.9 mmol/L (ref 3.5–5.1)
Sodium: 129 mmol/L — ABNORMAL LOW (ref 135–145)
TOTAL PROTEIN: 7.4 g/dL (ref 6.5–8.1)

## 2015-01-09 LAB — CBC WITH DIFFERENTIAL/PLATELET
Basophils Absolute: 0 10*3/uL (ref 0.0–0.1)
Basophils Relative: 0 %
Eosinophils Absolute: 0.1 10*3/uL (ref 0.0–0.7)
Eosinophils Relative: 1 %
HEMATOCRIT: 36.2 % (ref 36.0–46.0)
HEMOGLOBIN: 11.8 g/dL — AB (ref 12.0–15.0)
LYMPHS ABS: 2.4 10*3/uL (ref 0.7–4.0)
LYMPHS PCT: 38 %
MCH: 29.6 pg (ref 26.0–34.0)
MCHC: 32.6 g/dL (ref 30.0–36.0)
MCV: 90.7 fL (ref 78.0–100.0)
MONO ABS: 0.5 10*3/uL (ref 0.1–1.0)
MONOS PCT: 8 %
NEUTROS ABS: 3.4 10*3/uL (ref 1.7–7.7)
Neutrophils Relative %: 53 %
Platelets: 322 10*3/uL (ref 150–400)
RBC: 3.99 MIL/uL (ref 3.87–5.11)
RDW: 12.8 % (ref 11.5–15.5)
WBC: 6.4 10*3/uL (ref 4.0–10.5)

## 2015-01-09 LAB — CBG MONITORING, ED
Glucose-Capillary: >600 mg/dL (ref 65–99)
Glucose-Capillary: 272 mg/dL — ABNORMAL HIGH (ref 65–99)
Glucose-Capillary: 90 mg/dL (ref 65–99)

## 2015-01-09 MED ORDER — SODIUM CHLORIDE 0.9 % IV BOLUS (SEPSIS)
2000.0000 mL | Freq: Once | INTRAVENOUS | Status: AC
  Administered 2015-01-09: 2000 mL via INTRAVENOUS

## 2015-01-09 MED ORDER — DEXTROSE-NACL 5-0.45 % IV SOLN
INTRAVENOUS | Status: DC
  Administered 2015-01-09: 22:00:00 via INTRAVENOUS

## 2015-01-09 MED ORDER — SODIUM CHLORIDE 0.9 % IV SOLN
INTRAVENOUS | Status: DC
  Administered 2015-01-09: 5.4 [IU]/h via INTRAVENOUS
  Filled 2015-01-09: qty 2.5

## 2015-01-09 MED ORDER — HYDROMORPHONE HCL 1 MG/ML IJ SOLN
1.0000 mg | Freq: Once | INTRAMUSCULAR | Status: AC
  Administered 2015-01-10: 1 mg via INTRAVENOUS
  Filled 2015-01-09: qty 1

## 2015-01-09 MED ORDER — PROMETHAZINE HCL 25 MG/ML IJ SOLN
12.5000 mg | Freq: Once | INTRAMUSCULAR | Status: AC
  Administered 2015-01-09: 12.5 mg via INTRAVENOUS
  Filled 2015-01-09: qty 1

## 2015-01-09 MED ORDER — HYDROMORPHONE HCL 2 MG/ML IJ SOLN
2.0000 mg | Freq: Once | INTRAMUSCULAR | Status: AC
  Administered 2015-01-09: 2 mg via INTRAVENOUS
  Filled 2015-01-09: qty 1

## 2015-01-09 MED ORDER — PROMETHAZINE HCL 25 MG/ML IJ SOLN
12.5000 mg | Freq: Once | INTRAMUSCULAR | Status: AC
  Administered 2015-01-10: 12.5 mg via INTRAVENOUS
  Filled 2015-01-09: qty 1

## 2015-01-09 NOTE — ED Notes (Signed)
Pt's CMP was off blood blood collected upon pt's arrival to room @ 2040. Blood was collected and sent to lab and the MD ordered lab work at a later time.

## 2015-01-09 NOTE — ED Provider Notes (Signed)
CSN: [id number]     Arrival date & time 01/09/15  1910 History   First MD Initiated Contact with Patient 01/09/15 2015     Chief Complaint  Patient presents with  . Emesis  . Hyperglycemia     (Consider location/radiation/quality/duration/timing/severity/associated sxs/prior Treatment) HPI   [name] is a 34 y.o. female who presents for evaluation of hyperglycemia, abdominal pain and dehydration. She has ongoing chronic diarrhea with weight loss for 8 months. She is using oxycodone twice a day as treatment for diarrhea. 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 This afternoon, her blood sugar was 241. She denies fever, chills, rectal bleeding, vomiting, cough or chest pain. She has nausea. She has frequent episodes like this, and typically has have IV fluids to improve. She's not been able sleep for the last several days because of frequent episodes of diarrhea. She states that she recently tested negative for C. difficile infection. There are no other no modifying factors.    Past Medical History  Diagnosis Date  . DM type 1 causing complication ([place]) dx [id number]    hyperglycemia +/- DKA + coma, severe hypoglycemia, gastroparesis, peripheral neuropathy  . Thyroid disease     hypothyroidism associated with pregnancy  . Back pain   . PONV (postoperative nausea and vomiting)   . Anxiety     hx panic attacks.   Marland Kitchen GERD (gastroesophageal reflux disease)   . DM gastroparesis ([place])   . Anemia     .Autoimmune hemolytic anemia. bone marrow bx 2008, Dr Marin Olp  . Colitis, Clostridium difficile 05/2014  . Fatty liver 01/2007    with hepatomegaly on CT scan.    Past Surgical History  Procedure Laterality Date  . Laparoscopy  02/2002, 06/2010    laparoscopy with lysis pelvic adhesions for pelvic pain  . Back surgery      age 81  . Tooth extraction    . Root canal  10/10/12  . Flexible sigmoidoscopy N/A 06/24/2014    Procedure: FLEXIBLE SIGMOIDOSCOPY;  Surgeon: Milus Banister, MD;   Location: [facility];  Service: Endoscopy;  Laterality: N/A;  . Esophagogastroduodenoscopy (egd) with propofol N/A 06/27/2014    Procedure: ESOPHAGOGASTRODUODENOSCOPY (EGD) WITH PROPOFOL;  Surgeon: Milus Banister, MD;  Location: [facility];  Service: Endoscopy;  Laterality: N/A;   Family History  Problem Relation Age of Onset  . Diabetes Maternal Grandmother   . Cancer Maternal Grandmother     Leukemia  . Cancer Maternal Grandfather     Small cell lung cancer   Social History  Substance Use Topics  . Smoking status: Former Smoker    Quit date: 04/19/2000  . Smokeless tobacco: Former Systems developer  . Alcohol Use: No     Comment: RARE SIPS OF WINE   OB History    No data available     Review of Systems  All other systems reviewed and are negative.     Allergies  Scallops; Haloperidol and related; Morphine and related; Toradol; Latex; and Other  Home Medications   Prior to Admission medications   Medication Sig Start Date End Date Taking? Authorizing Provider  acetaminophen (TYLENOL) 500 MG tablet Take 1,000 mg by mouth every 6 (six) hours as needed for moderate pain.    Yes Historical Provider, MD  ALPRAZolam Duanne Moron) 1 MG tablet Take 2 mg by mouth 2 (two) times daily as needed for anxiety.    Yes Historical Provider, MD  amitriptyline (ELAVIL) 50 MG tablet Take 2 tablets (100 mg  total) by mouth at bedtime. 08/07/14  Yes Arnoldo Morale, MD  CALCIUM PO Take 2 tablets by mouth daily.   Yes Historical Provider, MD  cholestyramine Lucrezia Starch) 4 G packet Take 1 packet (4 g total) by mouth 2 (two) times daily. Patient taking differently: Take 4 g by mouth 4 (four) times daily.  09/19/14  Yes Jeffrey Hedges, PA-C  CREON 36000 UNITS CPEP capsule Take 4-6 capsules by mouth. Takes 6 capsules with every meal and 4 capsules with every snack. 12/02/14  Yes Historical Provider, MD  cyanocobalamin 500 MCG tablet Take 2,000 mcg by mouth 2 (two) times daily as needed (mouth sores).    Yes Historical  Provider, MD  dicyclomine (BENTYL) 10 MG capsule Take 10 mg by mouth 4 (four) times daily -  before meals and at bedtime.   Yes Historical Provider, MD  diphenoxylate-atropine (LOMOTIL) 2.5-0.025 MG per tablet Take 1 tablet by mouth 4 (four) times daily as needed for diarrhea or loose stools. 11/08/14  Yes Orpah Greek, MD  feeding supplement, GLUCERNA SHAKE, (GLUCERNA SHAKE) LIQD Take 237 mLs by mouth 3 (three) times daily between meals. Patient taking differently: Take 237 mLs by mouth daily.  07/06/14  Yes Thurnell Lose, MD  gabapentin (NEURONTIN) 100 MG capsule Take 300 mg by mouth at bedtime.    Yes Historical Provider, MD  insulin aspart (NOVOLOG FLEXPEN) 100 UNIT/ML FlexPen Inject 0-5 Units into the skin 3 (three) times daily as needed for high blood sugar.    Yes Historical Provider, MD  insulin glargine (LANTUS) 100 UNIT/ML injection Inject 0.2 mLs (20 Units total) into the skin every morning. Patient taking differently: Inject 7-8 Units into the skin 2 (two) times daily. 8 units in the morning and 7 units at night 08/07/14  Yes Arnoldo Morale, MD  metoCLOPramide (REGLAN) 10 MG tablet Take 1 tablet (10 mg total) by mouth every 6 (six) hours. Patient taking differently: Take 10 mg by mouth every 6 (six) hours as needed for nausea or vomiting.  12/26/14  Yes Jola Schmidt, MD  Multiple Vitamin (MULTIVITAMIN WITH MINERALS) TABS Take 2 tablets by mouth daily. Diabetic support vitamin   Yes Historical Provider, MD  omeprazole (PRILOSEC) 40 MG capsule Take 40 mg by mouth daily as needed (indigestion).    Yes Historical Provider, MD  ondansetron (ZOFRAN ODT) 4 MG disintegrating tablet 4mg  ODT q4 hours prn nausea/vomit 11/28/14  Yes Milton Ferguson, MD  oxyCODONE-acetaminophen (PERCOCET) 10-325 MG tablet Take 0.5 tablets by mouth every 6 (six) hours as needed for pain.    Yes Historical Provider, MD  Potassium (POTASSIMIN PO) Take 2 capsules by mouth daily.   Yes Historical Provider, MD  Probiotic  Product (DIGESTIVE ADVANTAGE GUMMIES PO) Take 2 tablets by mouth 2 (two) times daily.   Yes Historical Provider, MD  promethazine (PHENERGAN) 25 MG suppository Place 1 suppository (25 mg total) rectally every 6 (six) hours as needed for nausea or vomiting. 01/04/15  Yes Deno Etienne, DO  saccharomyces boulardii (FLORASTOR) 250 MG capsule Take 1 capsule (250 mg total) by mouth 2 (two) times daily. 07/06/14  Yes Thurnell Lose, MD  glucagon (GLUCAGON EMERGENCY) 1 MG injection Inject 1 mg into the muscle once as needed (severe hypoglycemia). 08/14/14   Arnoldo Morale, MD  HUMALOG 100 UNIT/ML cartridge Inject 5-15 Units as directed 3 (three) times daily. 11/12/14   Historical Provider, MD  insulin aspart (NOVOLOG) 100 UNIT/ML injection Inject 0-5 Units into the skin 3 (three) times daily with meals.  Patient not taking: Reported on 01/09/2015 08/01/14   Silver Huguenin Elgergawy, MD  Lactobacillus Esau Grew) PACK Mix 1/2 packet with soft food; take twice a day for 5 days Patient not taking: Reported on 12/26/2014 11/18/14   Antonietta Breach, PA-C  promethazine (PHENERGAN) 25 MG suppository Place 1 suppository (25 mg total) rectally every 6 (six) hours as needed for nausea or vomiting. Patient not taking: Reported on 01/09/2015 12/26/14   Jola Schmidt, MD  promethazine (PHENERGAN) 25 MG suppository Place 1 suppository (25 mg total) rectally every 6 (six) hours as needed for nausea or vomiting. 01/10/15   Daleen Bo, MD  traMADol (ULTRAM) 50 MG tablet Take 1 tablet (50 mg total) by mouth every 6 (six) hours as needed. 01/10/15   Daleen Bo, MD   BP 108/81 mmHg  Pulse 85  Temp(Src) 98.4 F (36.9 C) (Oral)  Resp 18  Ht 5\' 5"  (1.651 m)  Wt 90 lb 4 oz (40.937 kg)  BMI 15.02 kg/m2  SpO2 100% Physical Exam  Constitutional: She is oriented to person, place, and time. She appears well-developed.  Appears under nourished  HENT:  Head: Normocephalic and atraumatic.  Right Ear: External ear normal.  Left Ear: External ear  normal.  Oral mucous membranes are moist  Eyes: Conjunctivae and EOM are normal. Pupils are equal, round, and reactive to light.  Neck: Normal range of motion and phonation normal. Neck supple.  Cardiovascular: Normal rate, regular rhythm and normal heart sounds.   Pulmonary/Chest: Effort normal and breath sounds normal. No respiratory distress. She has no wheezes. She exhibits no bony tenderness.  Abdominal: Soft. She exhibits no mass. There is tenderness (Diffuse, moderate). There is no rebound and no guarding.  Musculoskeletal: Normal range of motion.  Neurological: She is alert and oriented to person, place, and time. No cranial nerve deficit or sensory deficit. She exhibits normal muscle tone. Coordination normal.  Skin: Skin is warm, dry and intact.  Psychiatric: She has a normal mood and affect. Her behavior is normal. Judgment and thought content normal.  Nursing note and vitals reviewed.   ED Course  Procedures (including critical care time)  Medications  dextrose 5 %-0.45 % sodium chloride infusion ( Intravenous New Bag/Given 01/09/15 2227)  insulin regular (NOVOLIN R,HUMULIN R) 250 Units in sodium chloride 0.9 % 250 mL (1 Units/mL) infusion (0 Units/hr Intravenous Hold 01/09/15 2335)  HYDROmorphone (DILAUDID) injection 1 mg (not administered)  promethazine (PHENERGAN) injection 12.5 mg (not administered)  sodium chloride 0.9 % bolus 2,000 mL (2,000 mLs Intravenous New Bag/Given 01/09/15 2127)  promethazine (PHENERGAN) injection 12.5 mg (12.5 mg Intravenous Given 01/09/15 2126)  HYDROmorphone (DILAUDID) injection 2 mg (2 mg Intravenous Given 01/09/15 2125)    Patient Vitals for the past 24 hrs:  BP Temp Temp src Pulse Resp SpO2 Height Weight  01/09/15 2309 108/81 mmHg - - 85 18 100 % - -  01/09/15 2300 108/81 mmHg - - 91 12 100 % - -  01/09/15 2230 110/79 mmHg - - 103 25 100 % - -  01/09/15 2200 106/81 mmHg - - 89 14 99 % - -  01/09/15 2130 107/74 mmHg - - 83 13 100 % - -   01/09/15 2100 108/79 mmHg - - 91 16 100 % - -  01/09/15 1933 97/73 mmHg 98.4 F (36.9 C) Oral 116 18 100 % 5\' 5"  (1.651 m) 90 lb 4 oz (40.937 kg)    11:55 PM Reevaluation with update and discussion. After initial assessment and treatment, an  updated evaluation reveals she is feeling better. She would like a little more medicine before deciding on going home. [place] Review Labs Reviewed  CBC WITH DIFFERENTIAL/PLATELET - Abnormal; Notable for the following:    Hemoglobin 11.8 (*)    All other components within normal limits  COMPREHENSIVE METABOLIC PANEL - Abnormal; Notable for the following:    Sodium 129 (*)    Chloride 92 (*)    Glucose, Bld 769 (*)    AST 44 (*)    All other components within normal limits  CBG MONITORING, ED - Abnormal; Notable for the following:    Glucose-Capillary >600 (*)    All other components within normal limits  CBG MONITORING, ED - Abnormal; Notable for the following:    Glucose-Capillary 272 (*)    All other components within normal limits  CBG MONITORING, ED   Note: Patient's blood work that is documented as having being obtained at 22:43, was actually drawn, when her IV was placed, shortly after arrival. Therefore, it reflects, a value prior to IV fluid bolus of 2 L.  Imaging Review No results found. I have personally reviewed and evaluated these images and lab results as part of my medical decision-making.   EKG Interpretation None      MDM   Final diagnoses:  Diarrhea, unspecified type  Hyperglycemia    Chronic diarrhea, with hyperglycemia. Doubt DKA, serious bacterial infection or metabolic instability.  Nursing Notes Reviewed/ Care Coordinated Applicable Imaging Reviewed Interpretation of Laboratory Data incorporated into ED treatment  The patient appears reasonably screened and/or stabilized for discharge and I doubt any other medical condition or other [facility] requiring further screening, evaluation, or treatment  in the ED at this time prior to discharge.  Plan: Home Medications- tramadol, Phenergan suppository; Home Treatments- rest; return here if the recommended treatment, does not improve the symptoms; Recommended follow up- PCP, when necessary. Consider follow-up with GI for diarrhea.     Daleen Bo, MD 01/10/15 [phone]

## 2015-01-09 NOTE — ED Notes (Signed)
Pt complains of vomiting and diarrhea and states that she's a diabetic and concerned about her blood sugar, 241 at home

## 2015-01-10 ENCOUNTER — Inpatient Hospital Stay (HOSPITAL_COMMUNITY)
Admission: EM | Admit: 2015-01-10 | Discharge: 2015-01-15 | DRG: 073 | Disposition: A | Discharge status: Home / Self Care | Payer: Medicaid Other | Attending: Family Medicine | Admitting: Family Medicine

## 2015-01-10 ENCOUNTER — Encounter (HOSPITAL_COMMUNITY): Payer: Self-pay | Admitting: Emergency Medicine

## 2015-01-10 DIAGNOSIS — R197 Diarrhea, unspecified: Secondary | ICD-10-CM

## 2015-01-10 DIAGNOSIS — E860 Dehydration: Secondary | ICD-10-CM | POA: Diagnosis present

## 2015-01-10 DIAGNOSIS — Z888 Allergy status to other drugs, medicaments and biological substances status: Secondary | ICD-10-CM

## 2015-01-10 DIAGNOSIS — G8929 Other chronic pain: Secondary | ICD-10-CM | POA: Diagnosis present

## 2015-01-10 DIAGNOSIS — R739 Hyperglycemia, unspecified: Secondary | ICD-10-CM

## 2015-01-10 DIAGNOSIS — Z885 Allergy status to narcotic agent status: Secondary | ICD-10-CM

## 2015-01-10 DIAGNOSIS — K219 Gastro-esophageal reflux disease without esophagitis: Secondary | ICD-10-CM | POA: Diagnosis present

## 2015-01-10 DIAGNOSIS — Z794 Long term (current) use of insulin: Secondary | ICD-10-CM

## 2015-01-10 DIAGNOSIS — K909 Intestinal malabsorption, unspecified: Secondary | ICD-10-CM | POA: Diagnosis present

## 2015-01-10 DIAGNOSIS — R300 Dysuria: Secondary | ICD-10-CM | POA: Diagnosis present

## 2015-01-10 DIAGNOSIS — Z91040 Latex allergy status: Secondary | ICD-10-CM

## 2015-01-10 DIAGNOSIS — E1043 Type 1 diabetes mellitus with diabetic autonomic (poly)neuropathy: Principal | ICD-10-CM | POA: Diagnosis present

## 2015-01-10 DIAGNOSIS — R112 Nausea with vomiting, unspecified: Secondary | ICD-10-CM

## 2015-01-10 DIAGNOSIS — Z79891 Long term (current) use of opiate analgesic: Secondary | ICD-10-CM

## 2015-01-10 DIAGNOSIS — Z681 Body mass index (BMI) 19 or less, adult: Secondary | ICD-10-CM

## 2015-01-10 DIAGNOSIS — D591 Other autoimmune hemolytic anemias: Secondary | ICD-10-CM | POA: Diagnosis present

## 2015-01-10 DIAGNOSIS — K760 Fatty (change of) liver, not elsewhere classified: Secondary | ICD-10-CM | POA: Diagnosis present

## 2015-01-10 DIAGNOSIS — K3184 Gastroparesis: Secondary | ICD-10-CM | POA: Diagnosis present

## 2015-01-10 DIAGNOSIS — E875 Hyperkalemia: Secondary | ICD-10-CM | POA: Diagnosis present

## 2015-01-10 DIAGNOSIS — Z91013 Allergy to seafood: Secondary | ICD-10-CM

## 2015-01-10 DIAGNOSIS — K8689 Other specified diseases of pancreas: Secondary | ICD-10-CM | POA: Diagnosis present

## 2015-01-10 DIAGNOSIS — E1065 Type 1 diabetes mellitus with hyperglycemia: Secondary | ICD-10-CM | POA: Diagnosis present

## 2015-01-10 DIAGNOSIS — M549 Dorsalgia, unspecified: Secondary | ICD-10-CM | POA: Diagnosis present

## 2015-01-10 DIAGNOSIS — E079 Disorder of thyroid, unspecified: Secondary | ICD-10-CM | POA: Diagnosis present

## 2015-01-10 DIAGNOSIS — E43 Unspecified severe protein-calorie malnutrition: Secondary | ICD-10-CM | POA: Diagnosis present

## 2015-01-10 DIAGNOSIS — F419 Anxiety disorder, unspecified: Secondary | ICD-10-CM | POA: Diagnosis present

## 2015-01-10 DIAGNOSIS — Z79899 Other long term (current) drug therapy: Secondary | ICD-10-CM

## 2015-01-10 DIAGNOSIS — K529 Noninfective gastroenteritis and colitis, unspecified: Secondary | ICD-10-CM | POA: Diagnosis present

## 2015-01-10 DIAGNOSIS — E10649 Type 1 diabetes mellitus with hypoglycemia without coma: Secondary | ICD-10-CM | POA: Diagnosis present

## 2015-01-10 DIAGNOSIS — Z87891 Personal history of nicotine dependence: Secondary | ICD-10-CM

## 2015-01-10 DIAGNOSIS — R1084 Generalized abdominal pain: Secondary | ICD-10-CM | POA: Diagnosis present

## 2015-01-10 LAB — CBG MONITORING, ED
GLUCOSE-CAPILLARY: 509 mg/dL — AB (ref 65–99)
Glucose-Capillary: 134 mg/dL — ABNORMAL HIGH (ref 65–99)
Glucose-Capillary: 552 mg/dL (ref 65–99)
Glucose-Capillary: >600 mg/dL (ref 65–99)
Glucose-Capillary: >600 mg/dL (ref 65–99)

## 2015-01-10 LAB — POC URINE PREG, ED: Preg Test, Ur: NEGATIVE

## 2015-01-10 LAB — CBC WITH DIFFERENTIAL/PLATELET
BASOS ABS: 0 10*3/uL (ref 0.0–0.1)
Basophils Relative: 0 %
Eosinophils Absolute: 0.1 10*3/uL (ref 0.0–0.7)
Eosinophils Relative: 1 %
HEMATOCRIT: 36.3 % (ref 36.0–46.0)
Hemoglobin: 11.9 g/dL — ABNORMAL LOW (ref 12.0–15.0)
LYMPHS ABS: 1.9 10*3/uL (ref 0.7–4.0)
LYMPHS PCT: 28 %
MCH: 29.6 pg (ref 26.0–34.0)
MCHC: 32.8 g/dL (ref 30.0–36.0)
MCV: 90.3 fL (ref 78.0–100.0)
MONO ABS: 0.4 10*3/uL (ref 0.1–1.0)
MONOS PCT: 6 %
NEUTROS ABS: 4.5 10*3/uL (ref 1.7–7.7)
Neutrophils Relative %: 65 %
Platelets: 367 10*3/uL (ref 150–400)
RBC: 4.02 MIL/uL (ref 3.87–5.11)
RDW: 12.8 % (ref 11.5–15.5)
WBC: 7 10*3/uL (ref 4.0–10.5)

## 2015-01-10 LAB — URINALYSIS, ROUTINE W REFLEX MICROSCOPIC
Bilirubin Urine: NEGATIVE
Hgb urine dipstick: NEGATIVE
Ketones, ur: NEGATIVE mg/dL
LEUKOCYTES UA: NEGATIVE
Nitrite: NEGATIVE
PH: 7 (ref 5.0–8.0)
Protein, ur: NEGATIVE mg/dL
SPECIFIC GRAVITY, URINE: 1.027 (ref 1.005–1.030)

## 2015-01-10 LAB — COMPREHENSIVE METABOLIC PANEL
ALT: 48 U/L (ref 14–54)
AST: 44 U/L — AB (ref 15–41)
Albumin: 4.6 g/dL (ref 3.5–5.0)
Alkaline Phosphatase: 86 U/L (ref 38–126)
Anion gap: 10 (ref 5–15)
BILIRUBIN TOTAL: 0.5 mg/dL (ref 0.3–1.2)
BUN: 13 mg/dL (ref 6–20)
CO2: 24 mmol/L (ref 22–32)
Calcium: 9.4 mg/dL (ref 8.9–10.3)
Chloride: 90 mmol/L — ABNORMAL LOW (ref 101–111)
Creatinine, Ser: 0.91 mg/dL (ref 0.44–1.00)
GFR calc Af Amer: >60 mL/min (ref >60)
Glucose, Bld: 872 mg/dL (ref 65–99)
POTASSIUM: 5.8 mmol/L — AB (ref 3.5–5.1)
Sodium: 124 mmol/L — ABNORMAL LOW (ref 135–145)
TOTAL PROTEIN: 7.7 g/dL (ref 6.5–8.1)

## 2015-01-10 LAB — URINE MICROSCOPIC-ADD ON: BACTERIA UA: NONE SEEN

## 2015-01-10 LAB — LIPASE, BLOOD: LIPASE: 18 U/L (ref 11–51)

## 2015-01-10 LAB — POC OCCULT BLOOD, ED: Fecal Occult Bld: NEGATIVE

## 2015-01-10 MED ORDER — SODIUM CHLORIDE 0.9 % IV SOLN
INTRAVENOUS | Status: DC
  Administered 2015-01-10: 5.4 [IU]/h via INTRAVENOUS
  Filled 2015-01-10: qty 2.5

## 2015-01-10 MED ORDER — SODIUM CHLORIDE 0.9 % IV SOLN
1000.0000 mL | Freq: Once | INTRAVENOUS | Status: AC
  Administered 2015-01-10: 1000 mL via INTRAVENOUS

## 2015-01-10 MED ORDER — SODIUM CHLORIDE 0.9 % IV SOLN
INTRAVENOUS | Status: DC
  Filled 2015-01-10: qty 2.5

## 2015-01-10 MED ORDER — HYDROCODONE-ACETAMINOPHEN 5-325 MG PO TABS
1.0000 | ORAL_TABLET | Freq: Once | ORAL | Status: AC
  Administered 2015-01-10: 1 via ORAL
  Filled 2015-01-10: qty 1

## 2015-01-10 MED ORDER — SODIUM CHLORIDE 0.9 % IV BOLUS (SEPSIS)
2000.0000 mL | Freq: Once | INTRAVENOUS | Status: AC
  Administered 2015-01-10: 2000 mL via INTRAVENOUS

## 2015-01-10 MED ORDER — ONDANSETRON HCL 4 MG/2ML IJ SOLN
4.0000 mg | Freq: Once | INTRAMUSCULAR | Status: AC
  Administered 2015-01-10: 4 mg via INTRAVENOUS
  Filled 2015-01-10: qty 2

## 2015-01-10 MED ORDER — DEXTROSE-NACL 5-0.45 % IV SOLN
INTRAVENOUS | Status: DC
Start: 2015-01-10 — End: 2015-01-11
  Administered 2015-01-11: 02:00:00 via INTRAVENOUS

## 2015-01-10 MED ORDER — HYDROMORPHONE HCL 2 MG/ML IJ SOLN
2.0000 mg | Freq: Once | INTRAMUSCULAR | Status: AC
  Administered 2015-01-10: 2 mg via INTRAVENOUS
  Filled 2015-01-10: qty 1

## 2015-01-10 MED ORDER — SODIUM CHLORIDE 0.9 % IV SOLN
1000.0000 mL | INTRAVENOUS | Status: DC
  Administered 2015-01-10: 1000 mL via INTRAVENOUS

## 2015-01-10 MED ORDER — PROMETHAZINE HCL 25 MG RE SUPP: 25.0000 mg | Freq: Four times a day (QID) | RECTAL | Status: DC | PRN

## 2015-01-10 MED ORDER — PROMETHAZINE HCL 25 MG/ML IJ SOLN
25.0000 mg | Freq: Once | INTRAMUSCULAR | Status: AC
  Administered 2015-01-10: 25 mg via INTRAVENOUS
  Filled 2015-01-10: qty 1

## 2015-01-10 MED ORDER — TRAMADOL HCL 50 MG PO TABS: 50.0000 mg | ORAL_TABLET | Freq: Four times a day (QID) | ORAL | Status: DC | PRN

## 2015-01-10 NOTE — ED Notes (Signed)
Pt requesting pain medication and nausea medication. PA aware.

## 2015-01-10 NOTE — ED Notes (Addendum)
Pt requesting pain medication and reports she normally gets pain medication because it treats her diarrhea. She is also requesting phenergan because that the only thing that works.  MD Jacubowitz made aware of patient's request of pain medication.

## 2015-01-10 NOTE — ED Notes (Addendum)
Will hold on Glucostabilizer and give fluids  First due to patient being fragile. Per staff from yesterday patient's CBG dropped significantly on Glucostabilzer. PA made aware and reports fluids and CBG q 1 hr.

## 2015-01-10 NOTE — ED Notes (Signed)
PA at bedside speaking with patient about admission

## 2015-01-10 NOTE — ED Notes (Signed)
PA at bedside.

## 2015-01-10 NOTE — ED Provider Notes (Signed)
Place of diffuse abdominal pain, vomiting diarrhea onset several months ago which she's had which is typical pain she gets from C. difficile. She reports having had 30 episodes of diarrhea today. Nonbloody. Also admits to mild dysuria at urethral meatus. Patient had near-syncopal event earlier today. She becomes lightheaded on standing. On exam chronically ill-appearing mucus memories dry abdomen nondistended normoactive bowel sounds mild diffuse tenderness  Orlie Dakin, MD 01/10/15 2140

## 2015-01-10 NOTE — ED Notes (Signed)
MD Jacubowitz at bedside.  

## 2015-01-10 NOTE — ED Notes (Signed)
Pt.c/o nausea vomiting diarrhea, abdominal pain since last December. The pain has been getting worse for the last 3-4 days. She stated that she has been blacking out since yesterday.

## 2015-01-10 NOTE — ED Provider Notes (Signed)
CSN: [id number]     Arrival date & time 01/10/15  1741 History   First MD Initiated Contact with Patient 01/10/15 1925     Chief Complaint  Patient presents with  . Abdominal Pain     (Consider location/radiation/quality/duration/timing/severity/associated sxs/prior Treatment) The history is provided by the patient and a parent.     Patient is a 34 year old female with history of type 1 diabetes, chronic diarrhea, gastroparesis, C. difficile colitis, anxiety and GERD, presents to the emergency department for evaluation of hyperglycemia, abdominal pain, nausea, vomiting, profuse diarrhea and syncopal episodes which have worsened since she was evaluated in the ER yesterday.  Patient has ongoing chronic diarrhea with weight loss over the past 8 months, she is currently on a narcotic pain medication regimen to attempt to slow her bowel movements. She states that she has a "brittle diabetic" and that small doses of insulin will cause her to become hyperglycemic. Yesterday the patient's initial blood glucose was 769 upon arrival to the ER. She was treated with DKA protocol including a glucose stabilizer, which reportedly dropped her very quickly resulting in some hypoglycemia. The patient expresses that yesterday she was given the option to be admitted or go home. She states that she wanted to be admitted however the provider had artery discharged her. She reportedly went home and had increasing frequency of watery diarrhea. She states as many as 30 watery bowel movements. She also had worsening abdominal pain and vomiting, which caused her to be unable to take any medications, and which cause rapid rise in her blood sugar.  She describes her abdominal pain as "all over" without any radiation or alleviating factors. She states that some point between being the ER yesterday and begin the ER today she had a syncopal episode while she was having a bowel movement. She states that she felt drowsy and believes she  may have fell forward off the toilet. She was able to get herself up and crawled into bed and fell asleep. She denies any head trauma, but was home alone. She currently denies any headache, neck pain, visual disturbances.  She states that due to her worsening condition, she called her mother so she could be watched by her.  The mother the patient stated today she has had no altered mental status or syncope over the last 4 hours.    Past Medical History  Diagnosis Date  . DM type 1 causing complication ([place]) dx [id number]    hyperglycemia +/- DKA + coma, severe hypoglycemia, gastroparesis, peripheral neuropathy  . Thyroid disease     hypothyroidism associated with pregnancy  . Back pain   . PONV (postoperative nausea and vomiting)   . Anxiety     hx panic attacks.   Marland Kitchen GERD (gastroesophageal reflux disease)   . DM gastroparesis ([place])   . Anemia     .Autoimmune hemolytic anemia. bone marrow bx 2008, Dr Marin Olp  . Colitis, Clostridium difficile 05/2014  . Fatty liver 01/2007    with hepatomegaly on CT scan.    Past Surgical History  Procedure Laterality Date  . Laparoscopy  02/2002, 06/2010    laparoscopy with lysis pelvic adhesions for pelvic pain  . Back surgery      age 19  . Tooth extraction    . Root canal  10/10/12  . Flexible sigmoidoscopy N/A 06/24/2014    Procedure: FLEXIBLE SIGMOIDOSCOPY;  Surgeon: Milus Banister, MD;  Location: [facility];  Service: Endoscopy;  Laterality: N/A;  . Esophagogastroduodenoscopy (egd)  with propofol N/A 06/27/2014    Procedure: ESOPHAGOGASTRODUODENOSCOPY (EGD) WITH PROPOFOL;  Surgeon: Milus Banister, MD;  Location: [facility];  Service: Endoscopy;  Laterality: N/A;   Family History  Problem Relation Age of Onset  . Diabetes Maternal Grandmother   . Cancer Maternal Grandmother     Leukemia  . Cancer Maternal Grandfather     Small cell lung cancer   Social History  Substance Use Topics  . Smoking status: Former Smoker    Quit date: 04/19/2000  .  Smokeless tobacco: Former Systems developer  . Alcohol Use: No     Comment: RARE SIPS OF WINE   OB History    No data available     Review of Systems  Constitutional: Positive for appetite change and fatigue. Negative for fever, chills and diaphoresis.  HENT: Negative.   Respiratory: Negative.  Negative for shortness of breath.   Cardiovascular: Negative.  Negative for chest pain, palpitations and leg swelling.  Gastrointestinal: Positive for nausea, vomiting, abdominal pain, diarrhea and rectal pain. Negative for constipation, blood in stool, abdominal distention and anal bleeding.  Genitourinary: Negative.   Musculoskeletal: Negative.   Skin: Negative.   Neurological: Positive for syncope, weakness and light-headedness. Negative for dizziness, tremors, seizures, facial asymmetry, speech difficulty, numbness and headaches.  Psychiatric/Behavioral: Positive for decreased concentration. Negative for confusion. The patient is nervous/anxious.       Allergies  Scallops; Haloperidol and related; Morphine and related; Toradol; Latex; and Other  Home Medications   Prior to Admission medications   Medication Sig Start Date End Date Taking? Authorizing Provider  acetaminophen (TYLENOL) 500 MG tablet Take 1,000 mg by mouth every 6 (six) hours as needed for moderate pain.    Yes Historical Provider, MD  ALPRAZolam Duanne Moron) 1 MG tablet Take 2 mg by mouth 2 (two) times daily as needed for anxiety.    Yes Historical Provider, MD  amitriptyline (ELAVIL) 50 MG tablet Take 2 tablets (100 mg total) by mouth at bedtime. 08/07/14  Yes Arnoldo Morale, MD  CALCIUM PO Take 2 tablets by mouth daily.   Yes Historical Provider, MD  cholestyramine Lucrezia Starch) 4 G packet Take 1 packet (4 g total) by mouth 2 (two) times daily. Patient taking differently: Take 4 g by mouth 4 (four) times daily.  09/19/14  Yes Jeffrey Hedges, PA-C  CREON 36000 UNITS CPEP capsule Take 4-6 capsules by mouth. Takes 6 capsules with every meal and 4  capsules with every snack. 12/02/14  Yes Historical Provider, MD  cyanocobalamin 500 MCG tablet Take 2,000 mcg by mouth 2 (two) times daily as needed (mouth sores).    Yes Historical Provider, MD  dicyclomine (BENTYL) 10 MG capsule Take 10 mg by mouth 4 (four) times daily -  before meals and at bedtime.   Yes Historical Provider, MD  diphenoxylate-atropine (LOMOTIL) 2.5-0.025 MG per tablet Take 1 tablet by mouth 4 (four) times daily as needed for diarrhea or loose stools. 11/08/14  Yes Orpah Greek, MD  Eluxadoline (VIBERZI) 100 MG TABS Take 100 mg by mouth 2 (two) times daily.   Yes Historical Provider, MD  feeding supplement, GLUCERNA SHAKE, (GLUCERNA SHAKE) LIQD Take 237 mLs by mouth 3 (three) times daily between meals. Patient taking differently: Take 237 mLs by mouth daily.  07/06/14  Yes Thurnell Lose, MD  gabapentin (NEURONTIN) 100 MG capsule Take 300 mg by mouth at bedtime.    Yes Historical Provider, MD  glucagon (GLUCAGON EMERGENCY) 1 MG injection Inject 1 mg into  the muscle once as needed (severe hypoglycemia). 08/14/14  Yes Arnoldo Morale, MD  insulin aspart (NOVOLOG FLEXPEN) 100 UNIT/ML FlexPen Inject 0-5 Units into the skin 3 (three) times daily as needed for high blood sugar.    Yes Historical Provider, MD  insulin aspart (NOVOLOG) 100 UNIT/ML injection Inject 0-5 Units into the skin 3 (three) times daily with meals. 08/01/14  Yes Silver Huguenin Elgergawy, MD  insulin glargine (LANTUS) 100 UNIT/ML injection Inject 0.2 mLs (20 Units total) into the skin every morning. Patient taking differently: Inject 7-8 Units into the skin 2 (two) times daily. 8 units in the morning and 7 units at night 08/07/14  Yes Arnoldo Morale, MD  metoCLOPramide (REGLAN) 10 MG tablet Take 1 tablet (10 mg total) by mouth every 6 (six) hours. Patient taking differently: Take 10 mg by mouth every 6 (six) hours as needed for nausea or vomiting.  12/26/14  Yes Jola Schmidt, MD  Multiple Vitamin (MULTIVITAMIN WITH  MINERALS) TABS Take 2 tablets by mouth daily. Diabetic support vitamin   Yes Historical Provider, MD  omeprazole (PRILOSEC) 40 MG capsule Take 40 mg by mouth daily as needed (indigestion).    Yes Historical Provider, MD  ondansetron (ZOFRAN ODT) 4 MG disintegrating tablet 4mg  ODT q4 hours prn nausea/vomit 11/28/14  Yes Milton Ferguson, MD  oxyCODONE-acetaminophen (PERCOCET) 10-325 MG tablet Take 0.5 tablets by mouth every 6 (six) hours as needed for pain.    Yes Historical Provider, MD  Potassium (POTASSIMIN PO) Take 2 capsules by mouth daily.   Yes Historical Provider, MD  Probiotic Product (DIGESTIVE ADVANTAGE GUMMIES PO) Take 2 tablets by mouth 2 (two) times daily.   Yes Historical Provider, MD  promethazine (PHENERGAN) 25 MG suppository Place 1 suppository (25 mg total) rectally every 6 (six) hours as needed for nausea or vomiting. 01/10/15  Yes Daleen Bo, MD  saccharomyces boulardii (FLORASTOR) 250 MG capsule Take 1 capsule (250 mg total) by mouth 2 (two) times daily. 07/06/14  Yes Thurnell Lose, MD  traMADol (ULTRAM) 50 MG tablet Take 1 tablet (50 mg total) by mouth every 6 (six) hours as needed. 01/10/15  Yes Daleen Bo, MD  HUMALOG 100 UNIT/ML cartridge Inject 5-15 Units as directed 3 (three) times daily. 11/12/14   Historical Provider, MD   BP 98/69 mmHg  Pulse 100  Temp(Src) 99.3 F (37.4 C) (Oral)  Resp 19  SpO2 100% Physical Exam  Constitutional: She is oriented to person, place, and time. She appears well-developed and well-nourished. No distress.  Chronically ill-appearing, thin female  HENT:  Head: Normocephalic and atraumatic.  Right Ear: External ear normal.  Left Ear: External ear normal.  Nose: Nose normal.  Mouth/Throat: No oropharyngeal exudate.  Oral mucosa dry  Eyes: Conjunctivae and EOM are normal. Pupils are equal, round, and reactive to light. Right eye exhibits no discharge. Left eye exhibits no discharge. No scleral icterus.  Neck: Normal range of motion.  Neck supple. No JVD present. No tracheal deviation present.  Cardiovascular: Normal rate and regular rhythm.  Exam reveals no gallop and no friction rub.   No murmur heard. Pulmonary/Chest: Effort normal and breath sounds normal. No stridor. No respiratory distress. She has no wheezes.  Abdominal: Soft. She exhibits no distension. There is tenderness. There is no rebound and no guarding.  Hyperactive bowel sounds 4, generalized tenderness without rebound or guarding  Musculoskeletal: Normal range of motion. She exhibits no edema.  Lymphadenopathy:    She has no cervical adenopathy.  Neurological: She is  alert and oriented to person, place, and time. No cranial nerve deficit. She exhibits normal muscle tone. Coordination normal.  Skin: Skin is warm and dry. No rash noted. She is not diaphoretic. No erythema. No pallor.  Decreased skin turgor  Psychiatric: She has a normal mood and affect. Her behavior is normal. Judgment and thought content normal.  Vitals reviewed.   ED Course  Procedures (including critical care time) Labs Review Labs Reviewed  CBC WITH DIFFERENTIAL/PLATELET - Abnormal; Notable for the following:    Hemoglobin 11.9 (*)    All other components within normal limits  COMPREHENSIVE METABOLIC PANEL - Abnormal; Notable for the following:    Sodium 124 (*)    Potassium 5.8 (*)    Chloride 90 (*)    Glucose, Bld 872 (*)    AST 44 (*)    All other components within normal limits  CBG MONITORING, ED - Abnormal; Notable for the following:    Glucose-Capillary >600 (*)    All other components within normal limits  CBG MONITORING, ED - Abnormal; Notable for the following:    Glucose-Capillary >600 (*)    All other components within normal limits  LIPASE, BLOOD  URINALYSIS, ROUTINE W REFLEX MICROSCOPIC (NOT AT [facility])  GI PATHOGEN PANEL BY PCR, STOOL  POC URINE PREG, ED  CBG MONITORING, ED  POC OCCULT BLOOD, ED    Imaging Review No results found. I have personally  reviewed and evaluated these images and lab results as part of my medical decision-making.   EKG Interpretation None      MDM   Final diagnoses:  None    Patient with adult onset type 1 diabetes with hyperglycemia, nausea, vomiting and profuse watery diarrhea. DKA order set initiated, patient appears clinically dry but is alert and oriented 3, able to follow commands and has no neurological deficit. She reports that her thinking is "foggy" but her and her mother deny any altered mental status.  She reports one or 2 syncopal episodes which occurred while having a bowel movement. It is unclear if this is from orthostatic hypotension or vasovagal response while having a bowel movement.  She denies any head injury or current pain related to syncopal episode.  Patient's initial glucose was >600 with POC CBG. CMP revealed initial glucose of 872.  The pt was hesitent to be treated with glucose stabilizer due to hypoglycemia yesterday.  She reports that she uses Lantus 7 units in the morning and 7 units at night, and only uses Humalog when her glucometer read "high." Even then she'll use 2 units.    The patient was discussed with Dr. Winfred Leeds, and primary RN, and we agreed to initiate glucose stabilizer with more frequent monitoring of her sugars, every 30 minutes.  Her pain and nausea was controlled with Dilaudid and Phenergan.   Her adjusted sodium was 134, initial potassium was 5.8, she had hypochloremia, urinalysis was positive for glucosuria but negative for ketones.   Dr. Roel Cluck was called for admission for hyperglycemia, dehydration, N, V and D.  Repeat BMP ordered to evaluate potassium.  Temporary hold orders entered for the pt to go to step-down due to frequent CBG's.    Delsa Grana, PA-C 01/11/15 [place], MD 01/12/15 [phone]

## 2015-01-10 NOTE — ED Notes (Signed)
Pt ambulated to restroom  With steady gait.

## 2015-01-10 NOTE — ED Notes (Signed)
Pt ambulated to restroom with steady gait.

## 2015-01-11 ENCOUNTER — Encounter (HOSPITAL_COMMUNITY): Payer: Self-pay | Admitting: Internal Medicine

## 2015-01-11 ENCOUNTER — Inpatient Hospital Stay (HOSPITAL_COMMUNITY): Payer: Medicaid Other

## 2015-01-11 DIAGNOSIS — K3184 Gastroparesis: Secondary | ICD-10-CM

## 2015-01-11 DIAGNOSIS — K219 Gastro-esophageal reflux disease without esophagitis: Secondary | ICD-10-CM | POA: Diagnosis present

## 2015-01-11 DIAGNOSIS — E10649 Type 1 diabetes mellitus with hypoglycemia without coma: Secondary | ICD-10-CM | POA: Diagnosis present

## 2015-01-11 DIAGNOSIS — R197 Diarrhea, unspecified: Secondary | ICD-10-CM | POA: Diagnosis not present

## 2015-01-11 DIAGNOSIS — Z888 Allergy status to other drugs, medicaments and biological substances status: Secondary | ICD-10-CM | POA: Diagnosis not present

## 2015-01-11 DIAGNOSIS — K760 Fatty (change of) liver, not elsewhere classified: Secondary | ICD-10-CM | POA: Diagnosis present

## 2015-01-11 DIAGNOSIS — R300 Dysuria: Secondary | ICD-10-CM | POA: Diagnosis present

## 2015-01-11 DIAGNOSIS — E43 Unspecified severe protein-calorie malnutrition: Secondary | ICD-10-CM | POA: Diagnosis present

## 2015-01-11 DIAGNOSIS — R112 Nausea with vomiting, unspecified: Secondary | ICD-10-CM | POA: Diagnosis not present

## 2015-01-11 DIAGNOSIS — E1143 Type 2 diabetes mellitus with diabetic autonomic (poly)neuropathy: Secondary | ICD-10-CM

## 2015-01-11 DIAGNOSIS — Z79891 Long term (current) use of opiate analgesic: Secondary | ICD-10-CM | POA: Diagnosis not present

## 2015-01-11 DIAGNOSIS — G8929 Other chronic pain: Secondary | ICD-10-CM | POA: Diagnosis present

## 2015-01-11 DIAGNOSIS — D591 Other autoimmune hemolytic anemias: Secondary | ICD-10-CM | POA: Diagnosis present

## 2015-01-11 DIAGNOSIS — R1084 Generalized abdominal pain: Secondary | ICD-10-CM | POA: Diagnosis present

## 2015-01-11 DIAGNOSIS — K909 Intestinal malabsorption, unspecified: Secondary | ICD-10-CM | POA: Diagnosis present

## 2015-01-11 DIAGNOSIS — Z885 Allergy status to narcotic agent status: Secondary | ICD-10-CM | POA: Diagnosis not present

## 2015-01-11 DIAGNOSIS — F419 Anxiety disorder, unspecified: Secondary | ICD-10-CM | POA: Diagnosis present

## 2015-01-11 DIAGNOSIS — E860 Dehydration: Secondary | ICD-10-CM

## 2015-01-11 DIAGNOSIS — Z681 Body mass index (BMI) 19 or less, adult: Secondary | ICD-10-CM | POA: Diagnosis not present

## 2015-01-11 DIAGNOSIS — E875 Hyperkalemia: Secondary | ICD-10-CM

## 2015-01-11 DIAGNOSIS — E1065 Type 1 diabetes mellitus with hyperglycemia: Secondary | ICD-10-CM | POA: Diagnosis not present

## 2015-01-11 DIAGNOSIS — K529 Noninfective gastroenteritis and colitis, unspecified: Secondary | ICD-10-CM | POA: Diagnosis present

## 2015-01-11 DIAGNOSIS — M549 Dorsalgia, unspecified: Secondary | ICD-10-CM | POA: Diagnosis present

## 2015-01-11 DIAGNOSIS — E108 Type 1 diabetes mellitus with unspecified complications: Secondary | ICD-10-CM

## 2015-01-11 DIAGNOSIS — E079 Disorder of thyroid, unspecified: Secondary | ICD-10-CM | POA: Diagnosis present

## 2015-01-11 DIAGNOSIS — Z79899 Other long term (current) drug therapy: Secondary | ICD-10-CM | POA: Diagnosis not present

## 2015-01-11 DIAGNOSIS — Z87891 Personal history of nicotine dependence: Secondary | ICD-10-CM | POA: Diagnosis not present

## 2015-01-11 DIAGNOSIS — K8689 Other specified diseases of pancreas: Secondary | ICD-10-CM | POA: Diagnosis present

## 2015-01-11 DIAGNOSIS — E1043 Type 1 diabetes mellitus with diabetic autonomic (poly)neuropathy: Secondary | ICD-10-CM | POA: Diagnosis not present

## 2015-01-11 DIAGNOSIS — Z91040 Latex allergy status: Secondary | ICD-10-CM | POA: Diagnosis not present

## 2015-01-11 DIAGNOSIS — Z794 Long term (current) use of insulin: Secondary | ICD-10-CM | POA: Diagnosis not present

## 2015-01-11 DIAGNOSIS — Z91013 Allergy to seafood: Secondary | ICD-10-CM | POA: Diagnosis not present

## 2015-01-11 LAB — COMPREHENSIVE METABOLIC PANEL
ALT: 34 U/L (ref 14–54)
AST: 24 U/L (ref 15–41)
Albumin: 3.6 g/dL (ref 3.5–5.0)
Alkaline Phosphatase: 60 U/L (ref 38–126)
Anion gap: 5 (ref 5–15)
BILIRUBIN TOTAL: 0.2 mg/dL — AB (ref 0.3–1.2)
BUN: 11 mg/dL (ref 6–20)
CO2: 25 mmol/L (ref 22–32)
CREATININE: 0.6 mg/dL (ref 0.44–1.00)
Calcium: 8.9 mg/dL (ref 8.9–10.3)
Chloride: 106 mmol/L (ref 101–111)
Glucose, Bld: 114 mg/dL — ABNORMAL HIGH (ref 65–99)
POTASSIUM: 3.4 mmol/L — AB (ref 3.5–5.1)
Sodium: 136 mmol/L (ref 135–145)
TOTAL PROTEIN: 6.1 g/dL — AB (ref 6.5–8.1)

## 2015-01-11 LAB — CBG MONITORING, ED
GLUCOSE-CAPILLARY: 204 mg/dL — AB (ref 65–99)
Glucose-Capillary: 304 mg/dL — ABNORMAL HIGH (ref 65–99)

## 2015-01-11 LAB — GLUCOSE, CAPILLARY
GLUCOSE-CAPILLARY: 176 mg/dL — AB (ref 65–99)
GLUCOSE-CAPILLARY: 218 mg/dL — AB (ref 65–99)
GLUCOSE-CAPILLARY: 213 mg/dL — AB (ref 65–99)
Glucose-Capillary: 178 mg/dL — ABNORMAL HIGH (ref 65–99)
Glucose-Capillary: 121 mg/dL — ABNORMAL HIGH (ref 65–99)
Glucose-Capillary: 302 mg/dL — ABNORMAL HIGH (ref 65–99)
Glucose-Capillary: 315 mg/dL — ABNORMAL HIGH (ref 65–99)

## 2015-01-11 LAB — PHOSPHORUS
PHOSPHORUS: 2.5 mg/dL (ref 2.5–4.6)
PHOSPHORUS: 2.5 mg/dL (ref 2.5–4.6)

## 2015-01-11 LAB — CBC
HEMATOCRIT: 30.2 % — AB (ref 36.0–46.0)
Hemoglobin: 10 g/dL — ABNORMAL LOW (ref 12.0–15.0)
MCH: 29.3 pg (ref 26.0–34.0)
MCHC: 33.1 g/dL (ref 30.0–36.0)
MCV: 88.6 fL (ref 78.0–100.0)
PLATELETS: 308 10*3/uL (ref 150–400)
RBC: 3.41 MIL/uL — ABNORMAL LOW (ref 3.87–5.11)
RDW: 12.8 % (ref 11.5–15.5)
WBC: 7.5 10*3/uL (ref 4.0–10.5)

## 2015-01-11 LAB — BASIC METABOLIC PANEL
Anion gap: 6 (ref 5–15)
BUN: 11 mg/dL (ref 6–20)
CHLORIDE: 105 mmol/L (ref 101–111)
CO2: 24 mmol/L (ref 22–32)
CREATININE: 0.66 mg/dL (ref 0.44–1.00)
Calcium: 8.7 mg/dL — ABNORMAL LOW (ref 8.9–10.3)
GFR calc Af Amer: >60 mL/min (ref >60)
GFR calc non Af Amer: >60 mL/min (ref >60)
GLUCOSE: 196 mg/dL — AB (ref 65–99)
Potassium: 3.5 mmol/L (ref 3.5–5.1)
SODIUM: 135 mmol/L (ref 135–145)

## 2015-01-11 LAB — MAGNESIUM
Magnesium: 1.8 mg/dL (ref 1.7–2.4)
Magnesium: 1.8 mg/dL (ref 1.7–2.4)

## 2015-01-11 LAB — PREALBUMIN: PREALBUMIN: 18.4 mg/dL (ref 18–38)

## 2015-01-11 LAB — TSH: TSH: 4.441 u[IU]/mL (ref 0.350–4.500)

## 2015-01-11 MED ORDER — GABAPENTIN 300 MG PO CAPS
300.0000 mg | ORAL_CAPSULE | Freq: Every day | ORAL | Status: DC
  Administered 2015-01-11 – 2015-01-14 (×5): 300 mg via ORAL
  Filled 2015-01-11 (×6): qty 1

## 2015-01-11 MED ORDER — PROMETHAZINE HCL 25 MG/ML IJ SOLN
12.5000 mg | INTRAMUSCULAR | Status: DC | PRN
  Administered 2015-01-11: 12.5 mg via INTRAVENOUS
  Filled 2015-01-11: qty 1

## 2015-01-11 MED ORDER — PANCRELIPASE (LIP-PROT-AMYL) 36000-114000 UNITS PO CPEP
6.0000 | ORAL_CAPSULE | Freq: Three times a day (TID) | ORAL | Status: DC
  Administered 2015-01-11 – 2015-01-15 (×15): 216000 [IU] via ORAL
  Filled 2015-01-11 (×16): qty 6

## 2015-01-11 MED ORDER — FOLIC ACID 5 MG/ML IJ SOLN
1.0000 mg | Freq: Every day | INTRAMUSCULAR | Status: DC
  Administered 2015-01-11: 1 mg via INTRAVENOUS
  Filled 2015-01-11 (×2): qty 0.2

## 2015-01-11 MED ORDER — INSULIN ASPART 100 UNIT/ML ~~LOC~~ SOLN
0.0000 [IU] | Freq: Every day | SUBCUTANEOUS | Status: DC
  Administered 2015-01-13: 2 [IU] via SUBCUTANEOUS
  Administered 2015-01-13: 3 [IU] via SUBCUTANEOUS

## 2015-01-11 MED ORDER — ENOXAPARIN SODIUM 40 MG/0.4ML ~~LOC~~ SOLN: 40.0000 mg | SUBCUTANEOUS | Status: DC

## 2015-01-11 MED ORDER — FENTANYL CITRATE (PF) 100 MCG/2ML IJ SOLN
25.0000 ug | INTRAMUSCULAR | Status: DC | PRN
  Administered 2015-01-11 (×2): 25 ug via INTRAVENOUS
  Filled 2015-01-11 (×2): qty 2

## 2015-01-11 MED ORDER — AMITRIPTYLINE HCL 100 MG PO TABS
100.0000 mg | ORAL_TABLET | Freq: Every day | ORAL | Status: DC
  Administered 2015-01-11 – 2015-01-14 (×5): 100 mg via ORAL
  Filled 2015-01-11 (×4): qty 1
  Filled 2015-01-11: qty 4
  Filled 2015-01-11: qty 1

## 2015-01-11 MED ORDER — ONDANSETRON HCL 4 MG PO TABS
4.0000 mg | ORAL_TABLET | Freq: Four times a day (QID) | ORAL | Status: DC | PRN
  Filled 2015-01-11: qty 1

## 2015-01-11 MED ORDER — SODIUM CHLORIDE 0.9 % IV SOLN: INTRAVENOUS | Status: DC

## 2015-01-11 MED ORDER — INSULIN GLARGINE 100 UNIT/ML ~~LOC~~ SOLN
7.0000 [IU] | Freq: Every day | SUBCUTANEOUS | Status: DC
  Administered 2015-01-11: 7 [IU] via SUBCUTANEOUS
  Filled 2015-01-11: qty 0.07

## 2015-01-11 MED ORDER — DEXTROSE-NACL 5-0.45 % IV SOLN
INTRAVENOUS | Status: DC
  Administered 2015-01-11: 02:00:00 via INTRAVENOUS

## 2015-01-11 MED ORDER — METOCLOPRAMIDE HCL 10 MG PO TABS
10.0000 mg | ORAL_TABLET | Freq: Four times a day (QID) | ORAL | Status: DC
  Administered 2015-01-11 – 2015-01-15 (×19): 10 mg via ORAL
  Filled 2015-01-11 (×27): qty 1

## 2015-01-11 MED ORDER — ENOXAPARIN SODIUM 30 MG/0.3ML ~~LOC~~ SOLN
30.0000 mg | SUBCUTANEOUS | Status: DC
  Administered 2015-01-11 – 2015-01-15 (×5): 30 mg via SUBCUTANEOUS
  Filled 2015-01-11 (×5): qty 0.3

## 2015-01-11 MED ORDER — ONDANSETRON HCL 4 MG/2ML IJ SOLN
4.0000 mg | Freq: Four times a day (QID) | INTRAMUSCULAR | Status: DC | PRN
  Administered 2015-01-11 – 2015-01-13 (×3): 4 mg via INTRAVENOUS
  Filled 2015-01-11 (×3): qty 2

## 2015-01-11 MED ORDER — ACETAMINOPHEN 650 MG RE SUPP: 650.0000 mg | Freq: Four times a day (QID) | RECTAL | Status: DC | PRN

## 2015-01-11 MED ORDER — INSULIN ASPART 100 UNIT/ML ~~LOC~~ SOLN
0.0000 [IU] | Freq: Three times a day (TID) | SUBCUTANEOUS | Status: DC
  Administered 2015-01-11 (×2): 5 [IU] via SUBCUTANEOUS
  Administered 2015-01-12: 11 [IU] via SUBCUTANEOUS
  Administered 2015-01-12: 3 [IU] via SUBCUTANEOUS
  Administered 2015-01-12 – 2015-01-13 (×3): 2 [IU] via SUBCUTANEOUS

## 2015-01-11 MED ORDER — DICYCLOMINE HCL 10 MG PO CAPS
10.0000 mg | ORAL_CAPSULE | Freq: Three times a day (TID) | ORAL | Status: DC
  Administered 2015-01-11 – 2015-01-15 (×19): 10 mg via ORAL
  Filled 2015-01-11 (×21): qty 1

## 2015-01-11 MED ORDER — GLUCERNA SHAKE PO LIQD
237.0000 mL | ORAL | Status: DC
  Administered 2015-01-11 – 2015-01-15 (×5): 237 mL via ORAL
  Filled 2015-01-11 (×6): qty 237

## 2015-01-11 MED ORDER — PANCRELIPASE (LIP-PROT-AMYL) 12000-38000 UNITS PO CPEP
144000.0000 [IU] | ORAL_CAPSULE | ORAL | Status: DC | PRN
  Filled 2015-01-11: qty 12

## 2015-01-11 MED ORDER — DIGESTIVE ADVANTAGE GUMMIES PO CHEW: CHEWABLE_TABLET | Freq: Two times a day (BID) | ORAL | Status: DC

## 2015-01-11 MED ORDER — ACETAMINOPHEN 325 MG PO TABS
162.5000 mg | ORAL_TABLET | Freq: Four times a day (QID) | ORAL | Status: DC | PRN
  Administered 2015-01-12: 162.5 mg via ORAL
  Filled 2015-01-11: qty 1

## 2015-01-11 MED ORDER — ACETAMINOPHEN 325 MG PO TABS: 650.0000 mg | ORAL_TABLET | Freq: Four times a day (QID) | ORAL | Status: DC | PRN

## 2015-01-11 MED ORDER — PANTOPRAZOLE SODIUM 40 MG PO TBEC
40.0000 mg | DELAYED_RELEASE_TABLET | Freq: Every day | ORAL | Status: DC
  Administered 2015-01-11 – 2015-01-15 (×5): 40 mg via ORAL
  Filled 2015-01-11 (×6): qty 1

## 2015-01-11 MED ORDER — DIPHENOXYLATE-ATROPINE 2.5-0.025 MG PO TABS: 1.0000 | ORAL_TABLET | Freq: Four times a day (QID) | ORAL | Status: DC | PRN

## 2015-01-11 MED ORDER — INSULIN GLARGINE 100 UNIT/ML ~~LOC~~ SOLN
8.0000 [IU] | SUBCUTANEOUS | Status: DC
  Filled 2015-01-11: qty 0.08

## 2015-01-11 MED ORDER — DEXTROSE 50 % IV SOLN
25.0000 mL | INTRAVENOUS | Status: DC | PRN
  Administered 2015-01-12: 25 mL via INTRAVENOUS
  Filled 2015-01-11: qty 50

## 2015-01-11 MED ORDER — THIAMINE HCL 100 MG/ML IJ SOLN
100.0000 mg | Freq: Every day | INTRAMUSCULAR | Status: DC
  Administered 2015-01-11: 100 mg via INTRAVENOUS
  Filled 2015-01-11: qty 2
  Filled 2015-01-11: qty 1

## 2015-01-11 MED ORDER — TRAMADOL HCL 50 MG PO TABS
50.0000 mg | ORAL_TABLET | Freq: Four times a day (QID) | ORAL | Status: DC | PRN
  Administered 2015-01-14 (×3): 50 mg via ORAL
  Filled 2015-01-11 (×4): qty 1

## 2015-01-11 MED ORDER — OXYCODONE-ACETAMINOPHEN 10-325 MG PO TABS: 0.5000 | ORAL_TABLET | Freq: Four times a day (QID) | ORAL | Status: DC | PRN

## 2015-01-11 MED ORDER — PANCRELIPASE (LIP-PROT-AMYL) 36000-114000 UNITS PO CPEP
4.0000 | ORAL_CAPSULE | ORAL | Status: DC | PRN
  Filled 2015-01-11: qty 4

## 2015-01-11 MED ORDER — CHOLESTYRAMINE 4 G PO PACK
4.0000 g | PACK | ORAL | Status: DC
  Administered 2015-01-11 – 2015-01-15 (×17): 4 g via ORAL
  Filled 2015-01-11 (×20): qty 1

## 2015-01-11 MED ORDER — INSULIN GLARGINE 100 UNIT/ML ~~LOC~~ SOLN
8.0000 [IU] | SUBCUTANEOUS | Status: DC
  Administered 2015-01-11 – 2015-01-12 (×2): 8 [IU] via SUBCUTANEOUS
  Filled 2015-01-11 (×2): qty 0.08

## 2015-01-11 MED ORDER — ALPRAZOLAM 1 MG PO TABS
2.0000 mg | ORAL_TABLET | Freq: Two times a day (BID) | ORAL | Status: DC | PRN
  Administered 2015-01-11 – 2015-01-14 (×4): 2 mg via ORAL
  Filled 2015-01-11 (×4): qty 2

## 2015-01-11 MED ORDER — RISAQUAD PO CAPS
1.0000 | ORAL_CAPSULE | Freq: Every day | ORAL | Status: DC
Start: 2015-01-11 — End: 2015-01-15
  Administered 2015-01-11 – 2015-01-15 (×5): 1 via ORAL
  Filled 2015-01-11 (×6): qty 1

## 2015-01-11 MED ORDER — ELUXADOLINE 100 MG PO TABS
100.0000 mg | ORAL_TABLET | Freq: Two times a day (BID) | ORAL | Status: DC
  Administered 2015-01-11 – 2015-01-15 (×9): 100 mg via ORAL
  Filled 2015-01-11 (×2): qty 1

## 2015-01-11 MED ORDER — HYDROMORPHONE HCL 1 MG/ML IJ SOLN
1.0000 mg | INTRAMUSCULAR | Status: DC | PRN
  Administered 2015-01-11 – 2015-01-12 (×6): 1 mg via INTRAVENOUS
  Filled 2015-01-11 (×6): qty 1

## 2015-01-11 MED ORDER — INSULIN ASPART 100 UNIT/ML ~~LOC~~ SOLN
0.0000 [IU] | SUBCUTANEOUS | Status: DC
  Administered 2015-01-11: 7 [IU] via SUBCUTANEOUS

## 2015-01-11 MED ORDER — OXYCODONE HCL 5 MG PO TABS
5.0000 mg | ORAL_TABLET | Freq: Four times a day (QID) | ORAL | Status: DC | PRN
  Administered 2015-01-11 – 2015-01-15 (×5): 5 mg via ORAL
  Filled 2015-01-11 (×7): qty 1

## 2015-01-11 MED ORDER — SACCHAROMYCES BOULARDII 250 MG PO CAPS
250.0000 mg | ORAL_CAPSULE | Freq: Two times a day (BID) | ORAL | Status: DC
  Administered 2015-01-11 – 2015-01-15 (×9): 250 mg via ORAL
  Filled 2015-01-11 (×10): qty 1

## 2015-01-11 MED ORDER — SODIUM CHLORIDE 0.9 % IJ SOLN
3.0000 mL | Freq: Two times a day (BID) | INTRAMUSCULAR | Status: DC
  Administered 2015-01-11 – 2015-01-15 (×7): 3 mL via INTRAVENOUS

## 2015-01-11 MED ORDER — DEXTROSE-NACL 5-0.45 % IV SOLN: INTRAVENOUS | Status: DC

## 2015-01-11 MED ORDER — SODIUM CHLORIDE 0.9 % IV SOLN
INTRAVENOUS | Status: DC
Start: 2015-01-11 — End: 2015-01-13
  Administered 2015-01-11: 75 mL/h via INTRAVENOUS
  Administered 2015-01-11 – 2015-01-13 (×3): via INTRAVENOUS

## 2015-01-11 NOTE — Progress Notes (Signed)
Inpatient Diabetes Program Recommendations  AACE/ADA: New Consensus Statement on Inpatient Glycemic Control (2015)  Target Ranges:  Prepandial:   less than 140 mg/dL      Peak postprandial:   less than 180 mg/dL (1-2 hours)      Critically ill patients:  140 - 180 mg/dL   Review of Glycemic Control  Diabetes history:  DM 1 Outpatient Diabetes medications: Lantus 8 units QAM, 7 units QPM, Novolog correction TID Current orders for Inpatient glycemic control: Lantus 8 units QAM, 7 units QPM, Novolog Sensitive correction TID  Inpatient Diabetes Program Recommendations:   Uncontrolled Glucose levels while inpatient. Extremely high glucose on admission. Unsure if patient is taking her basal insulin while she is feeling sick. Agree with current insulin regimen even though her fastings are high, this am she will get her am dose of basal insulin. This is the same regimen she was on during her last admission. Patient has been followed by our team during her last admission. Will follow patient while inpatient.  Thanks,  Tama Headings RN, MSN, [facility] Inpatient Diabetes Coordinator Team Pager [phone] (8a-5p)

## 2015-01-11 NOTE — Progress Notes (Signed)
Hand off report given to Maudie Mercury, Therapist, sports.  Patient is transferring to room 1509 via wheelchair.

## 2015-01-11 NOTE — Progress Notes (Signed)
Pt's CBG 179, after Insulin per Glucostabilizer had been off for a little over an hour, Lantus 8 units given SQ, however pt refused the short acting 2units of Reg. Insullin.  Pt's reasoning was she will bottom out her sugar if given at the same time.  I advised pt regarding the different Insulins, pt still refused.  Francia Greaves Raeghan Demeter,RN,BSN,CCRN

## 2015-01-11 NOTE — Progress Notes (Signed)
This weight is incorrect.

## 2015-01-11 NOTE — ED Notes (Signed)
MD Datouva at bedside. She is aware of pt CBG 304. She reports if CBG reaches 250 to start on Dex 5%.

## 2015-01-11 NOTE — Progress Notes (Signed)
This weight is incorrect. Bed was not properly zeroed.

## 2015-01-11 NOTE — ED Notes (Signed)
Phlebotomy at bedside.

## 2015-01-11 NOTE — Progress Notes (Signed)
At 20:51, patient was medicated a PRN oral dose of Alprazolam for complaint of significant anxiety with consequential sleep difficulty, which resulted in symptom alleviation. Patient had a singular pain episode this past shift, identifying the abdominal region as the primary pain source. Patient was medicated with a PRN intravenous dose of Hydromorphone for her pain occurrence, with good outcome.

## 2015-01-11 NOTE — H&P (Signed)
PCP:  [name], MD  [facility]  Referring provider [place]   Chief Complaint: Abdominal pain nausea vomiting diarrhea  HPI: [name] is a 34 y.o. female   has a past medical history of DM type 1 causing complication ([place]) (dx [id number]); Thyroid disease; Back pain; PONV (postoperative nausea and vomiting); Anxiety; GERD (gastroesophageal reflux disease); DM gastroparesis ([place]); Anemia; Colitis, Clostridium difficile (05/2014); and Fatty liver (01/2007).   Presented with chronic nausea vomiting diarrhea and abdominal pain that has been getting worse for past 3-4 days. She has a recurrent diarrhea. Patient has history of C. difficile in the past. Patient reports that her blood sugars has been out of controlled. Yesterday she was seen in emergency department was started on glucose stabilizer her blood sugar has plummeted down to 60s and glucose stabilizer had to be stopped. She improved and was discharged to home. She comes back again secondary to nausea vomiting and abdominal pain. Patient has had occasional episode of syncopal events associated with standing up. In the past patient has seen multiple specialists regarding recurrent diarrhea which was to be secondary to poor absorption. Patient has gastroparesis likely resulting in chronic nausea vomiting. Patient denies any blood in diarrhea or vomiting. Patient is stated in the past that sometimes abdominal pain has been associated DKA. Today patient's blood sugars was an 800 range. She was noted to have potassium of 5.8. In emergency department today she was giving normal saline bolus total 3 L. Phenergan and Zofran and Dilaudid IV and then was started on glucose stabilizer. Patient's prior studies have included flexible sigmoidoscopy in May that showed no evidence of C. Difficile. In June patient was admitted and had extensive workup by GI as well as psychiatry which showed pancreatic insufficiency at which point her pancreatic enzymes  have been increased. At some point cyclic sprue has been ruled out. Her current PCP has started hr on narcotics and xanax to help with chronic diarrhea and she states it did.   Hospitalist was called for admission for severe diarrhea and dehydration hypoglycemia  Review of Systems:    Pertinent positives include: abdominal pain, nausea, vomiting, diarrhea,  Constitutional:  No weight loss, night sweats, Fevers, chills, fatigue, weight loss  HEENT:  No headaches, Difficulty swallowing,Tooth/dental problems,Sore throat,  No sneezing, itching, ear ache, nasal congestion, post nasal drip,  Cardio-vascular:  No chest pain, Orthopnea, PND, anasarca, dizziness, palpitations.no Bilateral lower extremity swelling  GI:  No heartburn, indigestion,  change in bowel habits, loss of appetite, melena, blood in stool, hematemesis Resp:  no shortness of breath at rest. No dyspnea on exertion, No excess mucus, no productive cough, No non-productive cough, No coughing up of blood.No change in color of mucus.No wheezing. Skin:  no rash or lesions. No jaundice GU:  no dysuria, change in color of urine, no urgency or frequency. No straining to urinate.  No flank pain.  Musculoskeletal:  No joint pain or no joint swelling. No decreased range of motion. No back pain.  Psych:  No change in mood or affect. No depression or anxiety. No memory loss.  Neuro: no localizing neurological complaints, no tingling, no weakness, no double vision, no gait abnormality, no slurred speech, no confusion  Otherwise ROS are negative except for above, 10 systems were reviewed  Past Medical History: Past Medical History  Diagnosis Date  . DM type 1 causing complication ([place]) dx [id number]    hyperglycemia +/- DKA + coma, severe hypoglycemia, gastroparesis, peripheral neuropathy  .  Thyroid disease     hypothyroidism associated with pregnancy  . Back pain   . PONV (postoperative nausea and vomiting)   . Anxiety     hx panic  attacks.   Marland Kitchen GERD (gastroesophageal reflux disease)   . DM gastroparesis ([place])   . Anemia     .Autoimmune hemolytic anemia. bone marrow bx 2008, Dr Marin Olp  . Colitis, Clostridium difficile 05/2014  . Fatty liver 01/2007    with hepatomegaly on CT scan.    Past Surgical History  Procedure Laterality Date  . Laparoscopy  02/2002, 06/2010    laparoscopy with lysis pelvic adhesions for pelvic pain  . Back surgery      age 58  . Tooth extraction    . Root canal  10/10/12  . Flexible sigmoidoscopy N/A 06/24/2014    Procedure: FLEXIBLE SIGMOIDOSCOPY;  Surgeon: Milus Banister, MD;  Location: [facility];  Service: Endoscopy;  Laterality: N/A;  . Esophagogastroduodenoscopy (egd) with propofol N/A 06/27/2014    Procedure: ESOPHAGOGASTRODUODENOSCOPY (EGD) WITH PROPOFOL;  Surgeon: Milus Banister, MD;  Location: [facility];  Service: Endoscopy;  Laterality: N/A;     Medications: Prior to Admission medications   Medication Sig Start Date End Date Taking? Authorizing Provider  acetaminophen (TYLENOL) 500 MG tablet Take 1,000 mg by mouth every 6 (six) hours as needed for moderate pain.    Yes Historical Provider, MD  ALPRAZolam Duanne Moron) 1 MG tablet Take 2 mg by mouth 2 (two) times daily as needed for anxiety.    Yes Historical Provider, MD  amitriptyline (ELAVIL) 50 MG tablet Take 2 tablets (100 mg total) by mouth at bedtime. 08/07/14  Yes Arnoldo Morale, MD  CALCIUM PO Take 2 tablets by mouth daily.   Yes Historical Provider, MD  cholestyramine Lucrezia Starch) 4 G packet Take 1 packet (4 g total) by mouth 2 (two) times daily. Patient taking differently: Take 4 g by mouth 4 (four) times daily.  09/19/14  Yes Jeffrey Hedges, PA-C  CREON 36000 UNITS CPEP capsule Take 4-6 capsules by mouth. Takes 6 capsules with every meal and 4 capsules with every snack. 12/02/14  Yes Historical Provider, MD  cyanocobalamin 500 MCG tablet Take 2,000 mcg by mouth 2 (two) times daily as needed (mouth sores).    Yes Historical  Provider, MD  dicyclomine (BENTYL) 10 MG capsule Take 10 mg by mouth 4 (four) times daily -  before meals and at bedtime.   Yes Historical Provider, MD  diphenoxylate-atropine (LOMOTIL) 2.5-0.025 MG per tablet Take 1 tablet by mouth 4 (four) times daily as needed for diarrhea or loose stools. 11/08/14  Yes Orpah Greek, MD  Eluxadoline (VIBERZI) 100 MG TABS Take 100 mg by mouth 2 (two) times daily.   Yes Historical Provider, MD  feeding supplement, GLUCERNA SHAKE, (GLUCERNA SHAKE) LIQD Take 237 mLs by mouth 3 (three) times daily between meals. Patient taking differently: Take 237 mLs by mouth daily.  07/06/14  Yes Thurnell Lose, MD  gabapentin (NEURONTIN) 100 MG capsule Take 300 mg by mouth at bedtime.    Yes Historical Provider, MD  glucagon (GLUCAGON EMERGENCY) 1 MG injection Inject 1 mg into the muscle once as needed (severe hypoglycemia). 08/14/14  Yes Arnoldo Morale, MD  insulin aspart (NOVOLOG FLEXPEN) 100 UNIT/ML FlexPen Inject 0-5 Units into the skin 3 (three) times daily as needed for high blood sugar.    Yes Historical Provider, MD  insulin aspart (NOVOLOG) 100 UNIT/ML injection Inject 0-5 Units into the skin 3 (three)  times daily with meals. 08/01/14  Yes Silver Huguenin Elgergawy, MD  insulin glargine (LANTUS) 100 UNIT/ML injection Inject 0.2 mLs (20 Units total) into the skin every morning. Patient taking differently: Inject 7-8 Units into the skin 2 (two) times daily. 8 units in the morning and 7 units at night 08/07/14  Yes Arnoldo Morale, MD  metoCLOPramide (REGLAN) 10 MG tablet Take 1 tablet (10 mg total) by mouth every 6 (six) hours. Patient taking differently: Take 10 mg by mouth every 6 (six) hours as needed for nausea or vomiting.  12/26/14  Yes Jola Schmidt, MD  Multiple Vitamin (MULTIVITAMIN WITH MINERALS) TABS Take 2 tablets by mouth daily. Diabetic support vitamin   Yes Historical Provider, MD  omeprazole (PRILOSEC) 40 MG capsule Take 40 mg by mouth daily as needed (indigestion).     Yes Historical Provider, MD  ondansetron (ZOFRAN ODT) 4 MG disintegrating tablet 4mg  ODT q4 hours prn nausea/vomit 11/28/14  Yes Milton Ferguson, MD  oxyCODONE-acetaminophen (PERCOCET) 10-325 MG tablet Take 0.5 tablets by mouth every 6 (six) hours as needed for pain.    Yes Historical Provider, MD  Potassium (POTASSIMIN PO) Take 2 capsules by mouth daily.   Yes Historical Provider, MD  Probiotic Product (DIGESTIVE ADVANTAGE GUMMIES PO) Take 2 tablets by mouth 2 (two) times daily.   Yes Historical Provider, MD  promethazine (PHENERGAN) 25 MG suppository Place 1 suppository (25 mg total) rectally every 6 (six) hours as needed for nausea or vomiting. 01/10/15  Yes Daleen Bo, MD  saccharomyces boulardii (FLORASTOR) 250 MG capsule Take 1 capsule (250 mg total) by mouth 2 (two) times daily. 07/06/14  Yes Thurnell Lose, MD  traMADol (ULTRAM) 50 MG tablet Take 1 tablet (50 mg total) by mouth every 6 (six) hours as needed. 01/10/15  Yes Daleen Bo, MD  HUMALOG 100 UNIT/ML cartridge Inject 5-15 Units as directed 3 (three) times daily. 11/12/14   Historical Provider, MD    Allergies:   Allergies  Allergen Reactions  . Scallops [Shellfish Allergy] Anaphylaxis  . Haloperidol And Related Itching    Pt wanted to rip skin off  . Morphine And Related Itching  . Toradol [Ketorolac Tromethamine]     Pt wanted to rip skin off  . Latex Rash  . Other Rash    Sunscreen and grass    Social History:  Ambulatory   independently          Lives at home alone,        reports that she quit smoking about 14 years ago. She has quit using smokeless tobacco. She reports that she does not drink alcohol or use illicit drugs.    Family History: family history includes Cancer in her maternal grandfather and maternal grandmother; Diabetes in her maternal grandmother.    Physical Exam: Patient Vitals for the past 24 hrs:  BP Temp Temp src Pulse Resp SpO2  01/10/15 2330 114/79 mmHg - - 83 11 98 %  01/10/15  2130 125/87 mmHg - - 79 13 100 %  01/10/15 2100 111/77 mmHg - - 74 12 98 %  01/10/15 2042 98/69 mmHg - - 100 19 -  01/10/15 2041 105/74 mmHg - - 76 18 -  01/10/15 2033 114/71 mmHg 99.3 F (37.4 C) Oral 75 18 100 %  01/10/15 1810 110/78 mmHg 98.6 F (37 C) - 77 17 98 %    1. General:  in No Acute distress thin  2. Psychological: Alert and   Oriented 3. Head/ENT:  Dry Mucous Membranes                          Head Non traumatic, neck supple                          Normal  Dentition 4. SKIN:  decreased Skin turgor,  Skin clean Dry and intact no rash 5. Heart: Regular rate and rhythm no Murmur, Rub or gallop 6. Lungs: Clear to auscultation bilaterally, no wheezes or crackles   7. Abdomen: Soft, somewhat tender, Non distended 8. Lower extremities: no clubbing, cyanosis, or edema 9. Neurologically Grossly intact, moving all 4 extremities equally 10. MSK: Normal range of motion  body mass index is unknown because there is no weight on file.   Labs on Admission:   Results for orders placed or performed during the hospital encounter of 01/10/15 (from the past 24 hour(s))  CBG monitoring, ED     Status: Abnormal   Collection Time: 01/10/15  7:34 PM  Result Value Ref Range   Glucose-Capillary >600 (HH) 65 - 99 mg/dL  CBC WITH DIFFERENTIAL     Status: Abnormal   Collection Time: 01/10/15  8:25 PM  Result Value Ref Range   WBC 7.0 4.0 - 10.5 K/uL   RBC 4.02 3.87 - 5.11 MIL/uL   Hemoglobin 11.9 (L) 12.0 - 15.0 g/dL   HCT 36.3 36.0 - 46.0 %   MCV 90.3 78.0 - 100.0 fL   MCH 29.6 26.0 - 34.0 pg   MCHC 32.8 30.0 - 36.0 g/dL   RDW 12.8 11.5 - 15.5 %   Platelets 367 150 - 400 K/uL   Neutrophils Relative % 65 %   Neutro Abs 4.5 1.7 - 7.7 K/uL   Lymphocytes Relative 28 %   Lymphs Abs 1.9 0.7 - 4.0 K/uL   Monocytes Relative 6 %   Monocytes Absolute 0.4 0.1 - 1.0 K/uL   Eosinophils Relative 1 %   Eosinophils Absolute 0.1 0.0 - 0.7 K/uL   Basophils Relative 0 %   Basophils Absolute  0.0 0.0 - 0.1 K/uL  Comprehensive metabolic panel     Status: Abnormal   Collection Time: 01/10/15  8:25 PM  Result Value Ref Range   Sodium 124 (L) 135 - 145 mmol/L   Potassium 5.8 (H) 3.5 - 5.1 mmol/L   Chloride 90 (L) 101 - 111 mmol/L   CO2 24 22 - 32 mmol/L   Glucose, Bld 872 (HH) 65 - 99 mg/dL   BUN 13 6 - 20 mg/dL   Creatinine, Ser 0.91 0.44 - 1.00 mg/dL   Calcium 9.4 8.9 - 10.3 mg/dL   Total Protein 7.7 6.5 - 8.1 g/dL   Albumin 4.6 3.5 - 5.0 g/dL   AST 44 (H) 15 - 41 U/L   ALT 48 14 - 54 U/L   Alkaline Phosphatase 86 38 - 126 U/L   Total Bilirubin 0.5 0.3 - 1.2 mg/dL   GFR calc non Af Amer >60 >60 mL/min   GFR calc Af Amer >60 >60 mL/min   Anion gap 10 5 - 15  Lipase, blood     Status: None   Collection Time: 01/10/15  8:25 PM  Result Value Ref Range   Lipase 18 11 - 51 U/L  Urinalysis, Routine w reflex microscopic (not at [facility])     Status: Abnormal   Collection Time: 01/10/15  8:33 PM  Result Value Ref Range  Color, Urine YELLOW YELLOW   APPearance CLEAR CLEAR   Specific Gravity, Urine 1.027 1.005 - 1.030   pH 7.0 5.0 - 8.0   Glucose, UA >1000 (A) NEGATIVE mg/dL   Hgb urine dipstick NEGATIVE NEGATIVE   Bilirubin Urine NEGATIVE NEGATIVE   Ketones, ur NEGATIVE NEGATIVE mg/dL   Protein, ur NEGATIVE NEGATIVE mg/dL   Nitrite NEGATIVE NEGATIVE   Leukocytes, UA NEGATIVE NEGATIVE  Urine microscopic-add on     Status: Abnormal   Collection Time: 01/10/15  8:33 PM  Result Value Ref Range   Squamous Epithelial / LPF 0-5 (A) NONE SEEN   WBC, UA 0-5 0 - 5 WBC/hpf   RBC / HPF 0-5 0 - 5 RBC/hpf   Bacteria, UA NONE SEEN NONE SEEN  POC Urine Preg, ED (not at [facility])     Status: None   Collection Time: 01/10/15  8:52 PM  Result Value Ref Range   Preg Test, Ur NEGATIVE NEGATIVE  POC CBG, ED     Status: Abnormal   Collection Time: 01/10/15  9:03 PM  Result Value Ref Range   Glucose-Capillary >600 (HH) 65 - 99 mg/dL  POC occult blood, ED RN will collect     Status: None    Collection Time: 01/10/15  9:46 PM  Result Value Ref Range   Fecal Occult Bld NEGATIVE NEGATIVE  POC CBG, ED     Status: Abnormal   Collection Time: 01/10/15 10:13 PM  Result Value Ref Range   Glucose-Capillary >600 (HH) 65 - 99 mg/dL  POC CBG, ED     Status: Abnormal   Collection Time: 01/10/15 11:10 PM  Result Value Ref Range   Glucose-Capillary 552 (HH) 65 - 99 mg/dL   Comment 1 Notify RN    Comment 2 Document in Chart   POC CBG, ED     Status: Abnormal   Collection Time: 01/10/15 11:33 PM  Result Value Ref Range   Glucose-Capillary 509 (H) 65 - 99 mg/dL   Comment 1 Notify RN    Comment 2 Document in Chart     UA no evidence of UTI  Lab Results  Component Value Date   HGBA1C 11.0 08/07/2014    Estimated Creatinine Clearance: 56.2 mL/min (by C-G formula based on Cr of 0.91).  BNP (last 3 results) No results for input(s): PROBNP in the last 8760 hours.  Other results:  I have pearsonaly reviewed this: ECG REPORT Heart rate 94 QTC 432 sinus rhythm no evidence of ischemic changes   There were no vitals filed for this visit.   Cultures:    Component Value Date/Time   SDES URINE, CLEAN CATCH 11/28/2014 1533   SPECREQUEST Normal 11/28/2014 1533   CULT  11/28/2014 1533    8,000 COLONIES/mL INSIGNIFICANT GROWTH Performed at [facility] 11/29/2014 FINAL 11/28/2014 1533     Radiological Exams on Admission: No results found.  Chart has been reviewed  Family at  Bedside  plan of care was discussed with  Mother   Assessment/Plan  34 yo with chronic pancreatic insufficiency chronic diarrhea nausea and vomiting. That history of DKA in a setting of type 1 diabetes. Will admit for control of hyperglycemia as well as rehydration  Present on Admission:  . Type 1 diabetes mellitus with complications ([place]) - patient has history of gastroparesis will treat with Reglan. Given very labile blood sugar control we'll continue this point on glucose  stabilizer to bring blood sugars down. We'll make sure patient  is on IV source of glucose as she has not been able to tolerate by mouth to prevent development of DKA. Most transition to her home dose of Lantus once blood sugars are at goal but continue her D5 while she is still not tolerating by mouth intake  . Nausea vomiting and diarrhea -this is chronic and recurrent problem for the patient she has benefited in the past  from pancreatic enzymes. Patient also states that narcotics in the past have helped the diarrhea . Will need GI consult to help manage this complex patient.  . DM gastroparesis ([place])  continue Reglan . Diarrhea due to malabsorption continue home regimen to see if patient can tolerate 170s hydration has resolved . Hyperkalemia will repeat BMET obtain ECG and monitor on tele . Dehydration -we'll administer IV fluids Nutrition we'll check prealbumin levels and dilated diatitian consult   Prophylaxis:  Lovenox   CODE STATUS:  FULL CODE   as per patient    Disposition:     To home once workup is complete and patient is stable  Other plan as per orders.  I have spent a total of 55 min on this admission  Vicci Reder 01/11/2015, 12:14 AM  [facility]  Pager [phone]   after 2 AM please page floor coverage PA If 7AM-7PM, please contact the day team taking care of the patient  [email or web address]  Password [email or web address]

## 2015-01-11 NOTE — Progress Notes (Signed)
Patient seen and examined, admitted earlier this morning by Dr.Doutova 34 year old female with type 1 diabetes, chronic abdominal pain, pancreatic insufficiency and chronic diarrhea with recurrent admissions for uncontrolled hyperglycemia. Admitted with blood sugars in the 800 range without DKA this time. Corrected with insulin drip now back on Lantus and sliding-scale I suspect she skips her long-acting insulin when her GI symptoms flareup Counseled patient and mother to continue Lantus and adjust correction scale only at home Diabetes coordinator consulted Continue sliding scale insulin, IV fluids today -Home tomorrow stable Transfer to MedSurg bed  Domenic Polite, MD

## 2015-01-12 LAB — GLUCOSE, CAPILLARY
GLUCOSE-CAPILLARY: 157 mg/dL — AB (ref 65–99)
GLUCOSE-CAPILLARY: 304 mg/dL — AB (ref 65–99)
GLUCOSE-CAPILLARY: 157 mg/dL — AB (ref 65–99)
Glucose-Capillary: 143 mg/dL — ABNORMAL HIGH (ref 65–99)
Glucose-Capillary: 53 mg/dL — ABNORMAL LOW (ref 65–99)
Glucose-Capillary: 173 mg/dL — ABNORMAL HIGH (ref 65–99)
Glucose-Capillary: 253 mg/dL — ABNORMAL HIGH (ref 65–99)

## 2015-01-12 LAB — C DIFFICILE QUICK SCREEN W PCR REFLEX
C DIFFICILE (CDIFF) INTERP: NEGATIVE
C DIFFICILE (CDIFF) TOXIN: NEGATIVE
C Diff antigen: NEGATIVE

## 2015-01-12 MED ORDER — ADULT MULTIVITAMIN LIQUID CH
5.0000 mL | Freq: Every day | ORAL | Status: DC
  Administered 2015-01-12 – 2015-01-15 (×4): 5 mL via ORAL
  Filled 2015-01-12 (×4): qty 5

## 2015-01-12 MED ORDER — HYDROMORPHONE HCL 2 MG/ML IJ SOLN
INTRAMUSCULAR | Status: AC
  Administered 2015-01-12: 1.5 mg via INTRAVENOUS
  Filled 2015-01-12: qty 1

## 2015-01-12 MED ORDER — INSULIN GLARGINE 100 UNIT/ML ~~LOC~~ SOLN
10.0000 [IU] | SUBCUTANEOUS | Status: DC
  Administered 2015-01-13 – 2015-01-15 (×3): 10 [IU] via SUBCUTANEOUS
  Filled 2015-01-12 (×3): qty 0.1

## 2015-01-12 MED ORDER — FOLIC ACID 1 MG PO TABS
1.0000 mg | ORAL_TABLET | Freq: Every day | ORAL | Status: DC
  Administered 2015-01-12 – 2015-01-15 (×4): 1 mg via ORAL
  Filled 2015-01-12 (×4): qty 1

## 2015-01-12 MED ORDER — VITAMIN B-1 100 MG PO TABS
100.0000 mg | ORAL_TABLET | Freq: Every day | ORAL | Status: DC
  Administered 2015-01-12 – 2015-01-15 (×4): 100 mg via ORAL
  Filled 2015-01-12 (×4): qty 1

## 2015-01-12 MED ORDER — INSULIN GLARGINE 100 UNIT/ML ~~LOC~~ SOLN
10.0000 [IU] | Freq: Every day | SUBCUTANEOUS | Status: DC
  Administered 2015-01-12 – 2015-01-13 (×2): 10 [IU] via SUBCUTANEOUS
  Filled 2015-01-12 (×4): qty 0.1

## 2015-01-12 MED ORDER — HYDROMORPHONE HCL 1 MG/ML IJ SOLN
0.5000 mg | Freq: Once | INTRAMUSCULAR | Status: AC
  Administered 2015-01-12: 0.5 mg via INTRAVENOUS
  Filled 2015-01-12: qty 1

## 2015-01-12 MED ORDER — HYDROMORPHONE HCL 2 MG/ML IJ SOLN
1.5000 mg | INTRAMUSCULAR | Status: DC | PRN
  Administered 2015-01-12 – 2015-01-15 (×24): 1.5 mg via INTRAVENOUS
  Filled 2015-01-12 (×25): qty 1

## 2015-01-12 NOTE — Progress Notes (Signed)
Utilization Review Completed.[name] T11/20/2016  

## 2015-01-12 NOTE — Progress Notes (Signed)
Rechecked CBG was 173

## 2015-01-12 NOTE — Progress Notes (Signed)
[facility] PROGRESS NOTE  [name] [id number] DOB: Aug 16, 1980 DOA: 01/10/2015 PCP: Mandy Jewel, MD  Assessment/Plan: 1. Hyperglycemia-CBGs in 800s on admission -uncontrolled DM -Hb aic penidng -s/p Insulin gtt and IVF -continue lantus, SSI, increase dose  2. Chronic diarrhea with acute worsneing -r/o Cdiff, supportive care -H/o panc insuffciency  3. CHornic abd pain/N/V -r/o Cdiff -gastroparesis -continue reglan  DVT proph: lovenix  Code Status: Full Family Communication: none at bedside, d/w mother yesterday Disposition Plan: Home possibly tomorrow    HPI/Subjective: Having more diarrhea  Objective: Filed Vitals:   01/12/15 0613  BP: 96/63  Pulse: 93  Temp: 97.9 F (36.6 C)  Resp: 17    Intake/Output Summary (Last 24 hours) at 01/12/15 1000 Last data filed at 01/12/15 0630  Gross per 24 hour  Intake 4417.5 ml  Output      0 ml  Net 4417.5 ml   Filed Weights   01/11/15 1110  Weight: 44.7 kg (98 lb 8.7 oz)    Exam:   General:  AAOx3, thinly built  Cardiovascular: S1S2/RRR  Respiratory: CTAB  Abdomen: soft, NT, BS present  Musculoskeletal: no edema c/c   Data Reviewed: Basic Metabolic Panel:  Recent Labs Lab 01/09/15 2243 01/10/15 2025 01/11/15 0145 01/11/15 0402  NA 129* 124* 135 136  K 4.9 5.8* 3.5 3.4*  CL 92* 90* 105 106  CO2 29 24 24 25   GLUCOSE 769* 872* 196* 114*  BUN 6 13 11 11   CREATININE 0.86 0.91 0.66 0.60  CALCIUM 9.3 9.4 8.7* 8.9  MG  --   --   --  1.8  1.8  PHOS  --   --   --  2.5  2.5   Liver Function Tests:  Recent Labs Lab 01/09/15 2243 01/10/15 2025 01/11/15 0402  AST 44* 44* 24  ALT 46 48 34  ALKPHOS 77 86 60  BILITOT 0.5 0.5 0.2*  PROT 7.4 7.7 6.1*  ALBUMIN 4.3 4.6 3.6    Recent Labs Lab 01/10/15 2025  LIPASE 18   No results for input(s): AMMONIA in the last 168 hours. CBC:  Recent Labs Lab 01/09/15 2228 01/10/15 2025 01/11/15 0402  WBC 6.4 7.0 7.5  NEUTROABS 3.4  4.5  --   HGB 11.8* 11.9* 10.0*  HCT 36.2 36.3 30.2*  MCV 90.7 90.3 88.6  PLT 322 367 308   Cardiac Enzymes: No results for input(s): CKTOTAL, CKMB, CKMBINDEX, TROPONINI in the last 168 hours. BNP (last 3 results) No results for input(s): BNP in the last 8760 hours.  ProBNP (last 3 results) No results for input(s): PROBNP in the last 8760 hours.  CBG:  Recent Labs Lab 01/11/15 0747 01/11/15 1150 01/11/15 1700 01/11/15 2110 01/12/15 0755  GLUCAP 315* 218* 213* 157* 304*    No results found for this or any previous visit (from the past 240 hour(s)).   Studies: Abd 1 View (kub)  01/11/2015  CLINICAL DATA:  Nausea, vomiting and diarrhea. History of colitis. History of diabetes. EXAM: ABDOMEN - 1 VIEW COMPARISON:  12/14/2014 FINDINGS: Stomach is distended containing air mixed with debris. There is no bowel dilation to suggest obstruction or significant generalized adynamic ileus. No evidence of renal or ureteral stones. Soft tissues are unremarkable. Skeletal structures are unremarkable.  Lung bases are clear. IMPRESSION: 1. Distended stomach. This is likely a relative gastroparesis due to gastroenteritis given the patient's history. 2. No evidence of bowel obstruction. Electronically Signed   By: Lajean Manes M.D.   On: 01/11/2015 07:27  Scheduled Meds: . acidophilus  1 capsule Oral Daily  . amitriptyline  100 mg Oral QHS  . cholestyramine  4 g Oral 4 times per day  . dicyclomine  10 mg Oral TID AC & HS  . Eluxadoline  100 mg Oral BID  . enoxaparin (LOVENOX) injection  30 mg Subcutaneous Q24H  . feeding supplement (GLUCERNA SHAKE)  237 mL Oral Q24H  . folic acid  1 mg Oral Daily  . gabapentin  300 mg Oral QHS  . insulin aspart  0-15 Units Subcutaneous TID WC  . insulin aspart  0-5 Units Subcutaneous QHS  . insulin glargine  10 Units Subcutaneous QHS  . [START ON 01/13/2015] insulin glargine  10 Units Subcutaneous BH-q7a  . lipase/protease/amylase  6 capsule Oral TID WC   . metoCLOPramide  10 mg Oral 4 times per day  . pantoprazole  40 mg Oral Daily  . saccharomyces boulardii  250 mg Oral BID  . sodium chloride  3 mL Intravenous Q12H  . thiamine  100 mg Oral Daily   Continuous Infusions: . sodium chloride 75 mL/hr at 01/11/15 2121   Antibiotics Given (last 72 hours)    None      Active Problems:   DM gastroparesis ([facility])   Nausea vomiting and diarrhea   Type 1 diabetes mellitus with complications ([place])   Diarrhea due to malabsorption   Hyperkalemia   Dehydration    Time spent: 68min    [name]  [facility] Pager [phone]. If 7PM-7AM, please contact night-coverage at [email or web address], password [email or web address] 01/12/2015, 10:00 AM  LOS: 1 day

## 2015-01-12 NOTE — Progress Notes (Signed)
Patient hypoglycemic CBG was 53 patient states she felt shaky and mildly sweating. Gave 1/2 amp of Dextrose recheck due at [place].

## 2015-01-12 NOTE — Progress Notes (Signed)
PHARMACIST - PHYSICIAN COMMUNICATION DR:   Broadus John CONCERNING: Thiamine/Folic acid IV to Oral Route Change Policy  The patient is receiving Thiamine/ Folic acid by the intravenous route. Based on criteria approved by the Pharmacy and [place], the medication is being converted to the equivalent oral dose form.   These criteria include:  -Able to tolerate po diet -Able to tolerate other medications by the oral or enteral route   If you have any questions about this conversion, please contact the Pharmacy Department (ext [phone]). Thank you.  Garnet Sierras, PharmD

## 2015-01-12 NOTE — Progress Notes (Addendum)
Initial Nutrition Assessment  DOCUMENTATION CODES:   Severe malnutrition in context of chronic illness, Underweight  INTERVENTION:   -Will increase Glucerna Shake to po 6 times daily, each supplement provides 220 kcal and 10 grams of protein -Provided "Gastroparesis Nutrition Therapy" handout from [employer] -Multivitamin with minerals daily -Encourage small frequent meals -RD to continue to monitor  NUTRITION DIAGNOSIS:   Malnutrition related to chronic illness (gastroparesis) as evidenced by percent weight loss, severe depletion of body fat, severe depletion of muscle mass.  GOAL:   Patient will meet greater than or equal to 90% of their needs  MONITOR:   PO intake, Supplement acceptance, Labs, Weight trends, Skin, I & O's  REASON FOR ASSESSMENT:   Consult Assessment of nutrition requirement/status  ASSESSMENT:   34 year old female with type 1 diabetes, chronic abdominal pain, pancreatic insufficiency and chronic diarrhea with recurrent admissions for uncontrolled hyperglycemia.  Patient eating 100% of her meals at this time but states she is trying to take it slow. Pt states that everything she eats causes diarrhea for her. Lately pt has been tolerating baby food with no issues. Pt drinks 6 Glucerna shakes a day and makes her own protein shakes as well with soy milk. Provided patient with other protein options. Patient severely malnourished and underweight.  Provided Gastroparesis diet education, pt very familiar with information but appreciated a review. Encouraged use of a daily probiotic use and chewable multi-vitamin.   Per weight history, pt has lost 33 lb since 02/03/14 (27% weight loss x 11.5 months, significant for time frame).  Nutrition-Focused physical exam completed. Findings are severe fat depletion, severe muscle depletion, and no edema.   Labs reviewed: CBGs: 157-304 Low K Mg/Phos WNL  Diet Order:  Diet Carb Modified Fluid  consistency:: Thin; Room service appropriate?: Yes  Skin:  Reviewed, no issues  Last BM:  11/19  Height:   Ht Readings from Last 1 Encounters:  01/11/15 5\' 5"  (1.651 m)    Weight:   Wt Readings from Last 1 Encounters:  01/11/15 98 lb 8.7 oz (44.7 kg)    Ideal Body Weight:  56.8 kg  BMI:  Body mass index is 16.4 kg/(m^2).  Estimated Nutritional Needs:   Kcal:  1500-1700  Protein:  70-80g  Fluid:  1.7L/day  EDUCATION NEEDS:   Education needs addressed  Clayton Bibles, MS, RD, LDN Pager: [phone] After Hours Pager: [phone]

## 2015-01-13 LAB — BASIC METABOLIC PANEL
ANION GAP: 7 (ref 5–15)
BUN: 6 mg/dL (ref 6–20)
CALCIUM: 8.6 mg/dL — AB (ref 8.9–10.3)
CO2: 20 mmol/L — ABNORMAL LOW (ref 22–32)
Chloride: 109 mmol/L (ref 101–111)
Creatinine, Ser: 0.56 mg/dL (ref 0.44–1.00)
GLUCOSE: 252 mg/dL — AB (ref 65–99)
POTASSIUM: 3.7 mmol/L (ref 3.5–5.1)
Sodium: 136 mmol/L (ref 135–145)

## 2015-01-13 LAB — CBC
HEMATOCRIT: 32.1 % — AB (ref 36.0–46.0)
Hemoglobin: 10.6 g/dL — ABNORMAL LOW (ref 12.0–15.0)
MCH: 29.8 pg (ref 26.0–34.0)
MCHC: 33 g/dL (ref 30.0–36.0)
MCV: 90.2 fL (ref 78.0–100.0)
PLATELETS: 301 10*3/uL (ref 150–400)
RBC: 3.56 MIL/uL — AB (ref 3.87–5.11)
RDW: 12.9 % (ref 11.5–15.5)
WBC: 7 10*3/uL (ref 4.0–10.5)

## 2015-01-13 LAB — HEMOGLOBIN A1C
Hgb A1c MFr Bld: 12.6 % — ABNORMAL HIGH (ref 4.8–5.6)
Mean Plasma Glucose: 315 mg/dL

## 2015-01-13 LAB — GLUCOSE, CAPILLARY
GLUCOSE-CAPILLARY: 190 mg/dL — AB (ref 65–99)
GLUCOSE-CAPILLARY: 242 mg/dL — AB (ref 65–99)
GLUCOSE-CAPILLARY: 351 mg/dL — AB (ref 65–99)
GLUCOSE-CAPILLARY: 323 mg/dL — AB (ref 65–99)
Glucose-Capillary: 292 mg/dL — ABNORMAL HIGH (ref 65–99)

## 2015-01-13 MED ORDER — HYDROCORTISONE 1 % EX CREA
TOPICAL_CREAM | Freq: Three times a day (TID) | CUTANEOUS | Status: DC | PRN
  Administered 2015-01-13: 1 via TOPICAL
  Filled 2015-01-13: qty 28

## 2015-01-13 MED ORDER — FENTANYL CITRATE (PF) 100 MCG/2ML IJ SOLN
12.5000 ug | Freq: Once | INTRAMUSCULAR | Status: AC
  Administered 2015-01-13: 12.5 ug via INTRAVENOUS
  Filled 2015-01-13: qty 2

## 2015-01-13 MED ORDER — DIPHENOXYLATE-ATROPINE 2.5-0.025 MG PO TABS
1.0000 | ORAL_TABLET | Freq: Four times a day (QID) | ORAL | Status: DC
  Administered 2015-01-13 (×4): 1 via ORAL
  Filled 2015-01-13 (×4): qty 1

## 2015-01-13 MED ORDER — LOPERAMIDE HCL 2 MG PO CAPS
2.0000 mg | ORAL_CAPSULE | Freq: Two times a day (BID) | ORAL | Status: DC
  Administered 2015-01-13 (×2): 2 mg via ORAL
  Filled 2015-01-13 (×4): qty 1

## 2015-01-13 NOTE — Progress Notes (Signed)
[facility] PROGRESS NOTE  [name] [id number] DOB: 1980-09-03 DOA: 01/10/2015 PCP: Mandy Jewel, MD  Assessment/Plan: 1. Hyperglycemia-CBGs in 800s on admission -uncontrolled DM -Hbaic 12.6 -s/p Insulin gtt and IVF -continue lantus, SSI, increased dose -stable, advised on need to continue Lantus despite GI symptoms  2. Chronic diarrhea  -Cdiff PCR negative, supportive care -exam benign, tolerating diet -H/o panc insuffiencey -continue creon, resume Lomotil and imodium-takes this multiple times a day  3. CHornic abd pain/N/V -Cdiff PCR negative -gastroparesis -continue reglan  DVT proph: lovenox  Code Status: Full Family Communication: none at bedside, d/w mother 11/19 Disposition Plan: Home later today or  tomorrow    HPI/Subjective: Still having profuse diarrhea  Objective: Filed Vitals:   01/12/15 2205 01/13/15 0619  BP: 105/65 106/65  Pulse: 92 99  Temp: 97.7 F (36.5 C) 98.5 F (36.9 C)  Resp: 17 18    Intake/Output Summary (Last 24 hours) at 01/13/15 1159 Last data filed at 01/12/15 2207  Gross per 24 hour  Intake    240 ml  Output      0 ml  Net    240 ml   Filed Weights   01/11/15 1110  Weight: 44.7 kg (98 lb 8.7 oz)    Exam:   General:  AAOx3, thinly built  Cardiovascular: S1S2/RRR  Respiratory: CTAB  Abdomen: soft, NT, BS present  Musculoskeletal: no edema c/c   Data Reviewed: Basic Metabolic Panel:  Recent Labs Lab 01/09/15 2243 01/10/15 2025 01/11/15 0145 01/11/15 0402 01/13/15 0550  NA 129* 124* 135 136 136  K 4.9 5.8* 3.5 3.4* 3.7  CL 92* 90* 105 106 109  CO2 29 24 24 25  20*  GLUCOSE 769* 872* 196* 114* 252*  BUN 6 13 11 11 6   CREATININE 0.86 0.91 0.66 0.60 0.56  CALCIUM 9.3 9.4 8.7* 8.9 8.6*  MG  --   --   --  1.8  1.8  --   PHOS  --   --   --  2.5  2.5  --    Liver Function Tests:  Recent Labs Lab 01/09/15 2243 01/10/15 2025 01/11/15 0402  AST 44* 44* 24  ALT 46 48 34  ALKPHOS 77 86  60  BILITOT 0.5 0.5 0.2*  PROT 7.4 7.7 6.1*  ALBUMIN 4.3 4.6 3.6    Recent Labs Lab 01/10/15 2025  LIPASE 18   No results for input(s): AMMONIA in the last 168 hours. CBC:  Recent Labs Lab 01/09/15 2228 01/10/15 2025 01/11/15 0402 01/13/15 0550  WBC 6.4 7.0 7.5 7.0  NEUTROABS 3.4 4.5  --   --   HGB 11.8* 11.9* 10.0* 10.6*  HCT 36.2 36.3 30.2* 32.1*  MCV 90.7 90.3 88.6 90.2  PLT 322 367 308 301   Cardiac Enzymes: No results for input(s): CKTOTAL, CKMB, CKMBINDEX, TROPONINI in the last 168 hours. BNP (last 3 results) No results for input(s): BNP in the last 8760 hours.  ProBNP (last 3 results) No results for input(s): PROBNP in the last 8760 hours.  CBG:  Recent Labs Lab 01/12/15 1933 01/12/15 2133 01/13/15 0009 01/13/15 0608 01/13/15 0733  GLUCAP 173* 253* 292* 190* 242*    Recent Results (from the past 240 hour(s))  C difficile quick scan w PCR reflex     Status: None   Collection Time: 01/12/15 12:19 PM  Result Value Ref Range Status   C Diff antigen NEGATIVE NEGATIVE Final   C Diff toxin NEGATIVE NEGATIVE Final   C Diff interpretation  NEGATIVE  Final     Studies: No results found.  Scheduled Meds: . acidophilus  1 capsule Oral Daily  . amitriptyline  100 mg Oral QHS  . cholestyramine  4 g Oral 4 times per day  . dicyclomine  10 mg Oral TID AC & HS  . diphenoxylate-atropine  1 tablet Oral QID  . Eluxadoline  100 mg Oral BID  . enoxaparin (LOVENOX) injection  30 mg Subcutaneous Q24H  . feeding supplement (GLUCERNA SHAKE)  237 mL Oral Q24H  . folic acid  1 mg Oral Daily  . gabapentin  300 mg Oral QHS  . insulin aspart  0-15 Units Subcutaneous TID WC  . insulin aspart  0-5 Units Subcutaneous QHS  . insulin glargine  10 Units Subcutaneous QHS  . insulin glargine  10 Units Subcutaneous BH-q7a  . lipase/protease/amylase  6 capsule Oral TID WC  . loperamide  2 mg Oral BID  . metoCLOPramide  10 mg Oral 4 times per day  . multivitamin  5 mL Oral  Daily  . pantoprazole  40 mg Oral Daily  . saccharomyces boulardii  250 mg Oral BID  . sodium chloride  3 mL Intravenous Q12H  . thiamine  100 mg Oral Daily   Continuous Infusions: . sodium chloride 75 mL/hr at 01/13/15 1106   Antibiotics Given (last 72 hours)    None      Active Problems:   DM gastroparesis (HCC)   Nausea vomiting and diarrhea   Type 1 diabetes mellitus with complications ([place])   Diarrhea due to malabsorption   Hyperkalemia   Dehydration    Time spent: 59min    [name]  [facility] Pager [phone]. If 7PM-7AM, please contact night-coverage at [email or web address], password [facility] 01/13/2015, 11:59 AM  LOS: 2 days

## 2015-01-13 NOTE — Progress Notes (Addendum)
Inpatient Diabetes Program Recommendations  AACE/ADA: New Consensus Statement on Inpatient Glycemic Control (2015)  Target Ranges:  Prepandial:   less than 140 mg/dL      Peak postprandial:   less than 180 mg/dL (1-2 hours)      Critically ill patients:  140 - 180 mg/dL   Results for [name], [name] (MRN [id number]) as of 01/13/2015 09:50  Ref. Range 01/12/2015 07:55 01/12/2015 12:12 01/12/2015 16:44 01/12/2015 19:10 01/12/2015 19:33 01/12/2015 21:33  Glucose-Capillary Latest Ref Range: 65-99 mg/dL 304 (H) 157 (H) 143 (H) 53 (L) 173 (H) 253 (H)    Results for [name], [name] (MRN [id number]) as of 01/13/2015 09:50  Ref. Range 01/13/2015 07:33  Glucose-Capillary Latest Ref Range: 65-99 mg/dL 242 (H)     Admit with: Abd Pain/ Diarrhea/ Nausea/ Hyperglycemia  History: Type 1 DM, Gastroparesis  Home DM Meds: Lantus 8 units AM/ 7 units PM       Novolog SSI  Current Insulin Orders: Lantus 10 units bid      Novolog Moderate SSI (0-15 units) TID AC + HS    -Patient well known to the Inpatient Glycemic Control Team.  -Through Chart Review, note this is patient's 6th admission since January of this year.  Patient has also had 21 visits to the ED since January 2016.  -Note patient saw her Endocrinologist (Dr. Philemon Kingdom) on 05/09/14.  Patient was supposed to go to a Carbohydrate Counting refresher course on 06/07/14 and then was supposed to see Leonia Reader (CDE for Dr. Cruzita Lederer) on 06/12/14 for insulin pump restart.  Do not see that patient ever followed through with any of these appointments.     MD- Please consider the following in-hospital insulin adjustments:  1. Decrease Novolog SSI to Sensitive scale (0-9 units) TID AC + HS (currently ordered as Moderate scale: 0-15 units TID AC + HS)  2. Start low dose Novolog Meal Coverage- Novolog 2 units tid with meals  3. May need to back down on Lantus dose as well- Note that when patient was seeing Dr. Cruzita Lederer back in March  2016, patient's Lantus dose was 15 units QHS   Addendum 1130: Spoke with patient about her home DM insulin regimen.  Patient stated she sees Dr. Noralee Stain with [facility].  Note through Care Everywhere tab, patient last saw Dr. Jeannette Corpus on 12/23/14.  At that visit, patient was supposed to be taking Lantus 8 units bid + Novolog 1 unit for every 100 mg/dl above CBG of 200 mg/dl and also Novolog 1 unit for every 15 grams carbohydrates.  Patient stated she is often afraid to take insulin for her food b/c she often vomits after eating or has diarrhea directly after eating.  Discussed with patient the possibility of taking a small amount of insulin shortly after eating to assess if she will retain the food.  Oftentimes, patients with gastroparesis will take Novolog up to 20 minutes after a meal to make sure they don't vomit (in an effort to avoid Hypoglycemia).  Patient stated she will take insulin after food sometimes but often is afraid to take any insulin for food coverage.  Explained to patient that I understood her fear of Hypoglycemia, however, relayed to patient that chronic hyperglycemia can further worsen her gastroparesis.  Patient went on to tell me that her CBGs are extremely labile and that a small amount of insulin can rapidly drop her glucose levels.  Is currently working with her [facility] ENDO to restart insulin pump therapy.  Did not have any additional questions for me.  Encouraged patient to follow up closely with her ENDO at [facility]  Patient agreeable.     --Will follow patient during hospitalization--  Wyn Quaker RN, MSN, CDE Diabetes Coordinator Inpatient Glycemic Control Team Team Pager: [phone] (8a-5p)

## 2015-01-14 LAB — BASIC METABOLIC PANEL
ANION GAP: 8 (ref 5–15)
BUN: 9 mg/dL (ref 6–20)
CO2: 23 mmol/L (ref 22–32)
Calcium: 8.9 mg/dL (ref 8.9–10.3)
Chloride: 104 mmol/L (ref 101–111)
Creatinine, Ser: 0.52 mg/dL (ref 0.44–1.00)
GFR calc non Af Amer: >60 mL/min (ref >60)
GLUCOSE: 323 mg/dL — AB (ref 65–99)
POTASSIUM: 4 mmol/L (ref 3.5–5.1)
Sodium: 135 mmol/L (ref 135–145)

## 2015-01-14 LAB — GLUCOSE, CAPILLARY
GLUCOSE-CAPILLARY: 307 mg/dL — AB (ref 65–99)
GLUCOSE-CAPILLARY: 267 mg/dL — AB (ref 65–99)
GLUCOSE-CAPILLARY: 260 mg/dL — AB (ref 65–99)
GLUCOSE-CAPILLARY: 159 mg/dL — AB (ref 65–99)
Glucose-Capillary: 323 mg/dL — ABNORMAL HIGH (ref 65–99)
Glucose-Capillary: 104 mg/dL — ABNORMAL HIGH (ref 65–99)

## 2015-01-14 LAB — CBC
HEMATOCRIT: 29.2 % — AB (ref 36.0–46.0)
HEMOGLOBIN: 9.7 g/dL — AB (ref 12.0–15.0)
MCH: 29.1 pg (ref 26.0–34.0)
MCHC: 33.2 g/dL (ref 30.0–36.0)
MCV: 87.7 fL (ref 78.0–100.0)
Platelets: 258 10*3/uL (ref 150–400)
RBC: 3.33 MIL/uL — AB (ref 3.87–5.11)
RDW: 12.8 % (ref 11.5–15.5)
WBC: 7.5 10*3/uL (ref 4.0–10.5)

## 2015-01-14 MED ORDER — INSULIN ASPART 100 UNIT/ML ~~LOC~~ SOLN
0.0000 [IU] | Freq: Three times a day (TID) | SUBCUTANEOUS | Status: DC
  Administered 2015-01-14: 2 [IU] via SUBCUTANEOUS
  Administered 2015-01-15: 3 [IU] via SUBCUTANEOUS

## 2015-01-14 MED ORDER — SODIUM CHLORIDE 0.9 % IV SOLN
INTRAVENOUS | Status: DC
  Administered 2015-01-14 – 2015-01-15 (×3): via INTRAVENOUS

## 2015-01-14 MED ORDER — INSULIN ASPART 100 UNIT/ML ~~LOC~~ SOLN: 0.0000 [IU] | Freq: Every day | SUBCUTANEOUS | Status: DC

## 2015-01-14 MED ORDER — HYOSCYAMINE SULFATE 0.125 MG SL SUBL
0.2500 mg | SUBLINGUAL_TABLET | Freq: Four times a day (QID) | SUBLINGUAL | Status: DC | PRN
  Administered 2015-01-14 – 2015-01-15 (×3): 0.25 mg via SUBLINGUAL
  Filled 2015-01-14 (×5): qty 2

## 2015-01-14 MED ORDER — HYOSCYAMINE SULFATE 0.125 MG SL SUBL
0.2500 mg | SUBLINGUAL_TABLET | Freq: Once | SUBLINGUAL | Status: AC
  Administered 2015-01-14: 0.25 mg via SUBLINGUAL
  Filled 2015-01-14: qty 2

## 2015-01-14 MED ORDER — VANCOMYCIN HCL IN DEXTROSE 1-5 GM/200ML-% IV SOLN
1000.0000 mg | Freq: Two times a day (BID) | INTRAVENOUS | Status: AC
  Administered 2015-01-14 – 2015-01-15 (×2): 1000 mg via INTRAVENOUS
  Filled 2015-01-14 (×2): qty 200

## 2015-01-14 MED ORDER — LOPERAMIDE HCL 2 MG PO CAPS
4.0000 mg | ORAL_CAPSULE | Freq: Two times a day (BID) | ORAL | Status: DC
  Administered 2015-01-14 – 2015-01-15 (×4): 4 mg via ORAL
  Filled 2015-01-14 (×3): qty 2

## 2015-01-14 MED ORDER — DIPHENOXYLATE-ATROPINE 2.5-0.025 MG PO TABS
2.0000 | ORAL_TABLET | Freq: Four times a day (QID) | ORAL | Status: DC
  Administered 2015-01-14 – 2015-01-15 (×7): 2 via ORAL
  Filled 2015-01-14 (×7): qty 2

## 2015-01-14 NOTE — Progress Notes (Signed)
Pt complaining of pain at IV insertion site. IV removed per protocol. Upon IV being removed malodorous pus via the IV insertion site. Pt states the IV site is very painful and able to express pus from the insertion site. Ice applied to help with swelling. MD notified of IV insertion site and will prescribe antibiotics for the infection at site.  Calib Wadhwa W Gladie Gravette, RN

## 2015-01-14 NOTE — Progress Notes (Signed)
[facility] PROGRESS NOTE  [name] [id number] DOB: 1980/10/14 DOA: 01/10/2015 PCP: [name], MD   34 year old female with very brittle type 1 diabetes, uncontrolled, chronic abdominal pain, chronic diarrhea felt to be related to pancreatic insufficiency and poor absorption, history of C. difficile colitis presented to the emergency room with worsening nausea vomiting abdominal pain and uncontrolled diabetes . Day prior to admission she was seen in emergency department was started on glucose stabilizer her blood sugar has plummeted down to 60s and glucose stabilizer had to be stopped. She improved and was discharged to home.  Came back again on 11/19  secondary to nausea vomiting and abdominal pain her blood sugars were in the 800s. She was admitted to the ICU treated with IV fluids and insulin drip her blood sugars have improved, however continues to have chronic diarrhea, C. difficile PCR is negative Her home medications are being titrated to help with her diarrhea at this time, .  Assessment/Plan: 1. Hyperglycemia-CBGs in 800s on admission -uncontrolled DM -Hbaic 12.6 -s/p Insulin gtt and IVF -continue lantus, SSI, increased dose -stable, advised on need to continue Lantus despite GI symptoms  2. Chronic diarrhea  -Cdiff PCR negative, supportive care -exam benign, tolerating diet -H/o panc insuffiencey -continue creon, resumed Lomotil and imodium, will double the dose today -She reports that she has approximately 20 episodes per day at baseline, she is followed by a GI doctor at [place] abd pain/N/V -Cdiff PCR negative -H/o gastroparesis -continue reglan -improved, tolerating diet, eating very well, no vomiting, still needing pain meds  DVT proph: lovenox  Code Status: Full Family Communication: none at bedside, d/w mother 11/19 Disposition Plan: Home when diarrhea is slightly better     HPI/Subjective: Still having profuse diarrhea,  [place]  Objective: Filed Vitals:   01/13/15 2211 01/14/15 0640  BP: 115/79 105/56  Pulse: 78 99  Temp: 98.3 F (36.8 C) 98.2 F (36.8 C)  Resp: 18     Intake/Output Summary (Last 24 hours) at 01/14/15 1019 Last data filed at 01/14/15 1007  Gross per 24 hour  Intake      3 ml  Output      0 ml  Net      3 ml   Filed Weights   01/11/15 1110  Weight: 44.7 kg (98 lb 8.7 oz)    Exam:   General:  AAOx3, thinly built, ill appearing  Cardiovascular: S1S2/RRR  Respiratory: CTAB  Abdomen: soft, NT, BS present  Musculoskeletal: no edema c/c   Data Reviewed: Basic Metabolic Panel:  Recent Labs Lab 01/10/15 2025 01/11/15 0145 01/11/15 0402 01/13/15 0550 01/14/15 0540  NA 124* 135 136 136 135  K 5.8* 3.5 3.4* 3.7 4.0  CL 90* 105 106 109 104  CO2 24 24 25  20* 23  GLUCOSE 872* 196* 114* 252* 323*  BUN 13 11 11 6 9   CREATININE 0.91 0.66 0.60 0.56 0.52  CALCIUM 9.4 8.7* 8.9 8.6* 8.9  MG  --   --  1.8  1.8  --   --   PHOS  --   --  2.5  2.5  --   --    Liver Function Tests:  Recent Labs Lab 01/09/15 2243 01/10/15 2025 01/11/15 0402  AST 44* 44* 24  ALT 46 48 34  ALKPHOS 77 86 60  BILITOT 0.5 0.5 0.2*  PROT 7.4 7.7 6.1*  ALBUMIN 4.3 4.6 3.6    Recent Labs Lab 01/10/15 2025  LIPASE 18  No results for input(s): AMMONIA in the last 168 hours. CBC:  Recent Labs Lab 01/09/15 2228 01/10/15 2025 01/11/15 0402 01/13/15 0550 01/14/15 0540  WBC 6.4 7.0 7.5 7.0 7.5  NEUTROABS 3.4 4.5  --   --   --   HGB 11.8* 11.9* 10.0* 10.6* 9.7*  HCT 36.2 36.3 30.2* 32.1* 29.2*  MCV 90.7 90.3 88.6 90.2 87.7  PLT 322 367 308 301 258   Cardiac Enzymes: No results for input(s): CKTOTAL, CKMB, CKMBINDEX, TROPONINI in the last 168 hours. BNP (last 3 results) No results for input(s): BNP in the last 8760 hours.  ProBNP (last 3 results) No results for input(s): PROBNP in the last 8760 hours.  CBG:  Recent Labs Lab 01/13/15 1219 01/13/15 1701 01/13/15 2143  01/14/15 0636 01/14/15 0819  GLUCAP 351* 323* 323* 307* 267*    Recent Results (from the past 240 hour(s))  C difficile quick scan w PCR reflex     Status: None   Collection Time: 01/12/15 12:19 PM  Result Value Ref Range Status   C Diff antigen NEGATIVE NEGATIVE Final   C Diff toxin NEGATIVE NEGATIVE Final   C Diff interpretation NEGATIVE  Final     Studies: No results found.  Scheduled Meds: . acidophilus  1 capsule Oral Daily  . amitriptyline  100 mg Oral QHS  . cholestyramine  4 g Oral 4 times per day  . dicyclomine  10 mg Oral TID AC & HS  . diphenoxylate-atropine  2 tablet Oral QID  . Eluxadoline  100 mg Oral BID  . enoxaparin (LOVENOX) injection  30 mg Subcutaneous Q24H  . feeding supplement (GLUCERNA SHAKE)  237 mL Oral Q24H  . folic acid  1 mg Oral Daily  . gabapentin  300 mg Oral QHS  . insulin aspart  0-5 Units Subcutaneous QHS  . insulin aspart  0-9 Units Subcutaneous TID WC  . insulin glargine  10 Units Subcutaneous QHS  . insulin glargine  10 Units Subcutaneous BH-q7a  . lipase/protease/amylase  6 capsule Oral TID WC  . loperamide  4 mg Oral BID  . metoCLOPramide  10 mg Oral 4 times per day  . multivitamin  5 mL Oral Daily  . pantoprazole  40 mg Oral Daily  . saccharomyces boulardii  250 mg Oral BID  . sodium chloride  3 mL Intravenous Q12H  . thiamine  100 mg Oral Daily   Continuous Infusions:   Antibiotics Given (last 72 hours)    None      Active Problems:   DM gastroparesis (HCC)   Nausea vomiting and diarrhea   Type 1 diabetes mellitus with complications ([place])   Diarrhea due to malabsorption   Hyperkalemia   Dehydration    Time spent: 63min    Cypress Fanfan  [facility] Pager [phone]. If 7PM-7AM, please contact night-coverage at [email or web address], password [facility] 01/14/2015, 10:19 AM  LOS: 3 days

## 2015-01-15 LAB — BASIC METABOLIC PANEL
ANION GAP: 7 (ref 5–15)
BUN: 12 mg/dL (ref 6–20)
CALCIUM: 8.8 mg/dL — AB (ref 8.9–10.3)
CO2: 26 mmol/L (ref 22–32)
CREATININE: 0.59 mg/dL (ref 0.44–1.00)
Chloride: 102 mmol/L (ref 101–111)
GFR calc Af Amer: >60 mL/min (ref >60)
GLUCOSE: 419 mg/dL — AB (ref 65–99)
Potassium: 4.4 mmol/L (ref 3.5–5.1)
Sodium: 135 mmol/L (ref 135–145)

## 2015-01-15 LAB — GLUCOSE, CAPILLARY
GLUCOSE-CAPILLARY: 389 mg/dL — AB (ref 65–99)
GLUCOSE-CAPILLARY: 433 mg/dL — AB (ref 65–99)
GLUCOSE-CAPILLARY: 357 mg/dL — AB (ref 65–99)
GLUCOSE-CAPILLARY: 243 mg/dL — AB (ref 65–99)
Glucose-Capillary: 67 mg/dL (ref 65–99)

## 2015-01-15 LAB — GI PATHOGEN PANEL BY PCR, STOOL
C DIFFICILE TOXIN A/B: NOT DETECTED
Campylobacter by PCR: NOT DETECTED
Cryptosporidium by PCR: NOT DETECTED
E COLI (STEC): NOT DETECTED
E COLI 0157 BY PCR: NOT DETECTED
E coli (ETEC) LT/ST: NOT DETECTED
G lamblia by PCR: NOT DETECTED
Norovirus GI/GII: NOT DETECTED
Rotavirus A by PCR: NOT DETECTED
SALMONELLA BY PCR: NOT DETECTED
Shigella by PCR: NOT DETECTED

## 2015-01-15 MED ORDER — METOCLOPRAMIDE HCL 10 MG PO TABS: 10.0000 mg | ORAL_TABLET | Freq: Four times a day (QID) | ORAL | Status: DC | PRN

## 2015-01-15 MED ORDER — CEPHALEXIN 500 MG PO CAPS: 500.0000 mg | ORAL_CAPSULE | Freq: Two times a day (BID) | ORAL | Status: DC

## 2015-01-15 MED ORDER — PROMETHAZINE HCL 12.5 MG PO TABS: 12.5000 mg | ORAL_TABLET | Freq: Four times a day (QID) | ORAL | Status: DC | PRN

## 2015-01-15 MED ORDER — PROMETHAZINE HCL 25 MG RE SUPP: 25.0000 mg | Freq: Four times a day (QID) | RECTAL | Status: DC | PRN

## 2015-01-15 MED ORDER — INSULIN GLARGINE 100 UNIT/ML ~~LOC~~ SOLN: 7.0000 [IU] | Freq: Two times a day (BID) | SUBCUTANEOUS | Status: DC

## 2015-01-15 NOTE — Progress Notes (Signed)
Pt discharged via wheelchair with belongings. PT AOx4. AVS was reviewed with the pt. There were no questions from the pt at the time of discharge. Caoilainn Sacks W Ronalee Scheunemann, RN

## 2015-01-15 NOTE — Clinical Documentation Improvement (Signed)
Internal Medicine  Do you agree with the Registered Dietician's assessment of patient's malnutrition in the 01/12/15 note?   Document Severity - Severe(third degree), Moderate (second degree), Mild (first degree)  Other condition  Unable to clinically determine  Supporting Information:   01/12/15 RD note: " Severe malnutrition in context of chronic illness. Underweight. Severe depletion of body fat. Severe depletion of muscle mass."  BMI= 16.4   Please exercise your independent, professional judgment when responding. A specific answer is not anticipated or expected.   Thank You, Rolm Gala, RN, [place] [phone]

## 2015-01-15 NOTE — Progress Notes (Signed)
Date: January 15, 2015 Chart reviewed for concurrent status and case management needs. Will continue to follow patient for changes and needs: Glucose not stable readjusting insulin.  Frequent diarrehea continues  Velva Harman, RN, BSN, [place]   [phone]

## 2015-01-15 NOTE — Discharge Summary (Signed)
Physician Discharge Summary  [name] [id number] DOB: Feb 12, 1981 DOA: 01/10/2015  PCP: [name], MD  Admit date: 01/10/2015 Discharge date: 01/15/2015  Time spent: > 35 minutes  Recommendations for Outpatient Follow-up:  1. Monitor blood sugar levels and adjust hypoglycemic agents pending values 2. 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 female   has a past medical history of DM type 1 causing complication ([place]) (dx [id number]); Thyroid disease; Back pain; PONV (postoperative nausea and vomiting); Anxiety; GERD (gastroesophageal reflux disease); DM gastroparesis ([place]); Anemia; Colitis, Clostridium difficile (05/2014); and Fatty liver (01/2007).   Presented with chronic nausea vomiting diarrhea and abdominal pain that has been getting worse for past 3-4 days. She has a recurrent diarrhea. Patient has history of C. difficile in the past. Patient reports that her blood sugars has been out of controlled  Patient presented with complaints of nausea vomiting diarrhea and was found to have elevated blood sugars and they 100 range of which she was placed on glucose stabilizer. And was successfully transitioned to subcutaneous insulin.  Hospital Course:  Hyperglycemia/DM type I  - Patient has brittle diabetic  - Recommended that she follow-up with her endocrinologist.  -  patient is to continue her Lantus and her NovoLog. She reports she is not taking Humalog   Chronic diarrhea - Currently at baseline lab work does not support  dehydration. - I have discussed how she may use Imodium to help slow down her diarrhea.  Chronic pain - She is to follow-up with her primary care physician or whoever manages her primary care physician for further evaluation recommendations. - Otherwise is to continue Reglan  Procedures:  None  Consultations:  none  Discharge Exam: Filed Vitals:   01/15/15 0502 01/15/15 1330  BP: 96/66 100/67  Pulse: 97 102  Temp: 98.8 F (37.1 C) 98.1 F (36.7 C)  Resp: 18 18    General: pt in nad, alert and awake Cardiovascular: rrr, no mrg Respiratory: cta bl, no wheezes  Discharge Instructions   Discharge Instructions    Call MD for:  redness, tenderness, or signs of infection (pain, swelling, redness, odor or green/yellow discharge around incision site)    Complete by:  As directed      Call MD for:  temperature >100.4    Complete by:  As directed      Diet - low sodium heart healthy    Complete by:  As directed      Discharge instructions    Complete by:  As directed   Pt to follow up with her GI physician after discharge for further evaluation and recommendations.     Increase activity slowly    Complete by:  As directed           Current Discharge Medication List    START taking these medications   Details  cephALEXin (KEFLEX) 500 MG capsule Take 1 capsule (500 mg total) by mouth 2 (two) times daily. Qty: 10 capsule, Refills: 0      CONTINUE  these medications which have CHANGED   Details  insulin glargine (LANTUS) 100 UNIT/ML injection Inject 0.07-0.08 mLs (7-8 Units total) into the skin 2 (two) times daily. 8 units in the morning and 7 units at night Qty: 10 mL, Refills: 3    metoCLOPramide (REGLAN) 10 MG tablet Take 1 tablet (10 mg total) by mouth every 6 (six) hours as needed for nausea or vomiting. Qty: 30 tablet, Refills: 0    promethazine (PHENERGAN) 25 MG suppository Place 1 suppository (25 mg total) rectally every 6 (six) hours as needed for nausea or  vomiting. Qty: 12 suppository, Refills: 0      CONTINUE these medications which have NOT CHANGED   Details  acetaminophen (TYLENOL) 500 MG tablet Take 1,000 mg by mouth every 6 (six) hours as needed for moderate pain.     ALPRAZolam (XANAX) 1 MG tablet Take 2 mg by mouth 2 (two) times daily as needed for anxiety.     amitriptyline (ELAVIL) 50 MG tablet Take 2 tablets (100 mg total) by mouth at bedtime. Qty: 60 tablet, Refills: 0    CALCIUM PO Take 2 tablets by mouth daily.    cholestyramine (QUESTRAN) 4 G packet Take 1 packet (4 g total) by mouth 2 (two) times daily. Qty: 60 each, Refills: 12    CREON 36000 UNITS CPEP capsule Take 4-6 capsules by mouth. Takes 6 capsules with every meal and 4 capsules with every snack. Refills: 1    cyanocobalamin 500 MCG tablet Take 2,000 mcg by mouth 2 (two) times daily as needed (mouth sores).     dicyclomine (BENTYL) 10 MG capsule Take 10 mg by mouth 4 (four) times daily -  before meals and at bedtime.    diphenoxylate-atropine (LOMOTIL) 2.5-0.025 MG per tablet Take 1 tablet by mouth 4 (four) times daily as needed for diarrhea or loose stools. Qty: 30 tablet, Refills: 0    Eluxadoline (VIBERZI) 100 MG TABS Take 100 mg by mouth 2 (two) times daily.    feeding supplement, GLUCERNA SHAKE, (GLUCERNA SHAKE) LIQD Take 237 mLs by mouth 3 (three) times daily between meals. Qty: 90 Can, Refills: 0    gabapentin (NEURONTIN) 100 MG capsule Take 300 mg by mouth at bedtime.     glucagon (GLUCAGON EMERGENCY) 1 MG injection Inject 1 mg into the muscle once as needed (severe hypoglycemia). Qty: 2 each, Refills: prn    insulin aspart (NOVOLOG FLEXPEN) 100 UNIT/ML FlexPen Inject 0-5 Units into the skin 3 (three) times daily as needed for high blood sugar.     Multiple Vitamin (MULTIVITAMIN WITH MINERALS) TABS Take 2 tablets by mouth daily. Diabetic support vitamin    omeprazole (PRILOSEC) 40 MG capsule Take 40 mg by mouth daily as needed (indigestion).      ondansetron (ZOFRAN ODT) 4 MG disintegrating tablet 4mg  ODT q4 hours prn nausea/vomit Qty: 15 tablet, Refills: 0    oxyCODONE-acetaminophen (PERCOCET) 10-325 MG tablet Take 0.5 tablets by mouth every 6 (six) hours as needed for pain.     Potassium (POTASSIMIN PO) Take 2 capsules by mouth daily.    Probiotic Product (DIGESTIVE ADVANTAGE GUMMIES PO) Take 2 tablets by mouth 2 (two) times daily.    saccharomyces boulardii (FLORASTOR) 250 MG capsule Take 1 capsule (250 mg total) by mouth 2 (two) times daily. Qty: 60 capsule, Refills: 2    traMADol (ULTRAM) 50 MG tablet Take 1 tablet (50 mg total) by mouth every 6 (six) hours as needed. Qty: 30 tablet, Refills: 0  STOP taking these medications     insulin aspart (NOVOLOG) 100 UNIT/ML injection      HUMALOG 100 UNIT/ML cartridge        Allergies  Allergen Reactions  . Scallops [Shellfish Allergy] Anaphylaxis  . Haloperidol And Related Itching    Pt wanted to rip skin off  . Morphine And Related Itching  . Toradol [Ketorolac Tromethamine]     Pt wanted to rip skin off  . Latex Rash  . Other Rash    Sunscreen and grass      The results of significant diagnostics from this hospitalization (including imaging, microbiology, ancillary and laboratory) are listed below for reference.    Significant Diagnostic Studies: Abd 1 View (kub)  01/11/2015  CLINICAL DATA:  Nausea, vomiting and diarrhea. History of colitis. History of diabetes. EXAM: ABDOMEN - 1 VIEW COMPARISON:  12/14/2014 FINDINGS: Stomach is distended containing air mixed with debris. There is no bowel dilation to suggest obstruction or significant generalized adynamic ileus. No evidence of renal or ureteral stones. Soft tissues are unremarkable. Skeletal structures are unremarkable.  Lung bases are clear. IMPRESSION: 1. Distended stomach. This is likely a relative gastroparesis due to gastroenteritis given the patient's history. 2. No evidence of bowel obstruction.  Electronically Signed   By: Lajean Manes M.D.   On: 01/11/2015 07:27    Microbiology: Recent Results (from the past 240 hour(s))  C difficile quick scan w PCR reflex     Status: None   Collection Time: 01/12/15 12:19 PM  Result Value Ref Range Status   C Diff antigen NEGATIVE NEGATIVE Final   C Diff toxin NEGATIVE NEGATIVE Final   C Diff interpretation NEGATIVE  Final     Labs: Basic Metabolic Panel:  Recent Labs Lab 01/11/15 0145 01/11/15 0402 01/13/15 0550 01/14/15 0540 01/15/15 0540  NA 135 136 136 135 135  K 3.5 3.4* 3.7 4.0 4.4  CL 105 106 109 104 102  CO2 24 25 20* 23 26  GLUCOSE 196* 114* 252* 323* 419*  BUN 11 11 6 9 12   CREATININE 0.66 0.60 0.56 0.52 0.59  CALCIUM 8.7* 8.9 8.6* 8.9 8.8*  MG  --  1.8  1.8  --   --   --   PHOS  --  2.5  2.5  --   --   --    Liver Function Tests:  Recent Labs Lab 01/09/15 2243 01/10/15 2025 01/11/15 0402  AST 44* 44* 24  ALT 46 48 34  ALKPHOS 77 86 60  BILITOT 0.5 0.5 0.2*  PROT 7.4 7.7 6.1*  ALBUMIN 4.3 4.6 3.6    Recent Labs Lab 01/10/15 2025  LIPASE 18   No results for input(s): AMMONIA in the last 168 hours. CBC:  Recent Labs Lab 01/09/15 2228 01/10/15 2025 01/11/15 0402 01/13/15 0550 01/14/15 0540  WBC 6.4 7.0 7.5 7.0 7.5  NEUTROABS 3.4 4.5  --   --   --   HGB 11.8* 11.9* 10.0* 10.6* 9.7*  HCT 36.2 36.3 30.2* 32.1* 29.2*  MCV 90.7 90.3 88.6 90.2 87.7  PLT 322 367 308 301 258   Cardiac Enzymes: No results for input(s): CKTOTAL, CKMB, CKMBINDEX, TROPONINI in the last 168 hours. BNP: BNP (last 3 results) No results for input(s): BNP in the last 8760 hours.  ProBNP (last 3 results) No results for input(s): PROBNP in the last 8760 hours.  CBG:  Recent Labs Lab 01/15/15 0020 01/15/15 0743 01/15/15 1201 01/15/15 1404 01/15/15 1556  GLUCAP 67 389*  433* 357* 243*    Signed:  Velvet Bathe  [facility] 01/15/2015, 4:30 PM

## 2015-01-15 NOTE — Progress Notes (Addendum)
Inpatient Diabetes Program Recommendations  AACE/ADA: New Consensus Statement on Inpatient Glycemic Control (2015)  Target Ranges:  Prepandial:   less than 140 mg/dL      Peak postprandial:   less than 180 mg/dL (1-2 hours)      Critically ill patients:  140 - 180 mg/dL    Results for [name], [name] (MRN [id number]) as of 01/15/2015 10:03  Ref. Range 01/14/2015 06:36 01/14/2015 08:19 01/14/2015 11:57 01/14/2015 17:28 01/14/2015 22:04  Glucose-Capillary Latest Ref Range: 65-99 mg/dL 307 (H) 267 (H) 260 (H) 104 (H) 159 (H)    Results for [name], [name] (MRN [id number]) as of 01/15/2015 10:03  Ref. Range 01/15/2015 00:20 01/15/2015 07:43  Glucose-Capillary Latest Ref Range: 65-99 mg/dL 67 389 (H)    Current Insulin Orders: Lantus 10 units bid      Novolog Sensitive SSI (0-9 units) TID AC + HS    MD- Note patient refused to take her Lantus insulin (10 units) last night and also refused to take her Lantus insulin (10 units) this AM as well.  I spoke with patient personally on 11/21 about her DM care regimen.  Patient expressed to me that she has great fear of having Hypoglycemia and will not take insulin if she thinks she will go too low.  Note AM CBG today up to 389 mg/dl.  Perhaps we should try reducing patient's Lantus doses back to 8 units AM/ 7 units PM so she will be more likely to take the Lantus?  Addendum 11am: Spoke with patient about refusing Lantus this AM.  Patient stated she was "really out of it" this morning and didn't know if she refused or not.  Is willing to take Lantus insulin now (11am).  Spoke with RN covering patient and alerted her that patient now willing to take Lantus.  RN to administer Lantus asap.      --Will follow patient during hospitalization--  Wyn Quaker RN, MSN, CDE Diabetes Coordinator Inpatient Glycemic Control Team Team Pager: [phone] (8a-5p)

## 2015-02-11 ENCOUNTER — Encounter (HOSPITAL_COMMUNITY): Payer: Self-pay | Admitting: Emergency Medicine

## 2015-02-11 ENCOUNTER — Emergency Department (HOSPITAL_COMMUNITY)
Admission: EM | Admit: 2015-02-11 | Discharge: 2015-02-12 | Disposition: A | Discharge status: Home / Self Care | Payer: Medicaid Other | Attending: Emergency Medicine | Admitting: Emergency Medicine

## 2015-02-11 DIAGNOSIS — Z87891 Personal history of nicotine dependence: Secondary | ICD-10-CM | POA: Insufficient documentation

## 2015-02-11 DIAGNOSIS — K529 Noninfective gastroenteritis and colitis, unspecified: Secondary | ICD-10-CM

## 2015-02-11 DIAGNOSIS — Z794 Long term (current) use of insulin: Secondary | ICD-10-CM | POA: Insufficient documentation

## 2015-02-11 DIAGNOSIS — R197 Diarrhea, unspecified: Secondary | ICD-10-CM | POA: Diagnosis not present

## 2015-02-11 DIAGNOSIS — R000 Tachycardia, unspecified: Secondary | ICD-10-CM | POA: Insufficient documentation

## 2015-02-11 DIAGNOSIS — F419 Anxiety disorder, unspecified: Secondary | ICD-10-CM | POA: Insufficient documentation

## 2015-02-11 DIAGNOSIS — Z3202 Encounter for pregnancy test, result negative: Secondary | ICD-10-CM | POA: Diagnosis not present

## 2015-02-11 DIAGNOSIS — Z8619 Personal history of other infectious and parasitic diseases: Secondary | ICD-10-CM | POA: Diagnosis not present

## 2015-02-11 DIAGNOSIS — N390 Urinary tract infection, site not specified: Secondary | ICD-10-CM | POA: Insufficient documentation

## 2015-02-11 DIAGNOSIS — Z91040 Latex allergy status: Secondary | ICD-10-CM | POA: Diagnosis not present

## 2015-02-11 DIAGNOSIS — Z79899 Other long term (current) drug therapy: Secondary | ICD-10-CM | POA: Insufficient documentation

## 2015-02-11 DIAGNOSIS — R1110 Vomiting, unspecified: Secondary | ICD-10-CM | POA: Diagnosis present

## 2015-02-11 DIAGNOSIS — E1065 Type 1 diabetes mellitus with hyperglycemia: Secondary | ICD-10-CM | POA: Diagnosis not present

## 2015-02-11 DIAGNOSIS — K219 Gastro-esophageal reflux disease without esophagitis: Secondary | ICD-10-CM | POA: Insufficient documentation

## 2015-02-11 DIAGNOSIS — R739 Hyperglycemia, unspecified: Secondary | ICD-10-CM

## 2015-02-11 DIAGNOSIS — D649 Anemia, unspecified: Secondary | ICD-10-CM | POA: Insufficient documentation

## 2015-02-11 LAB — URINALYSIS, ROUTINE W REFLEX MICROSCOPIC
Bilirubin Urine: NEGATIVE
Glucose, UA: >1000 mg/dL — AB
Hgb urine dipstick: NEGATIVE
KETONES UR: NEGATIVE mg/dL
LEUKOCYTES UA: NEGATIVE
NITRITE: NEGATIVE
PROTEIN: NEGATIVE mg/dL
Specific Gravity, Urine: 1.046 — ABNORMAL HIGH (ref 1.005–1.030)
pH: 6 (ref 5.0–8.0)

## 2015-02-11 LAB — HEPATIC FUNCTION PANEL
ALK PHOS: 79 U/L (ref 38–126)
ALT: 48 U/L (ref 14–54)
AST: 42 U/L — ABNORMAL HIGH (ref 15–41)
Albumin: 4.6 g/dL (ref 3.5–5.0)
BILIRUBIN TOTAL: 0.5 mg/dL (ref 0.3–1.2)
Total Protein: 8.2 g/dL — ABNORMAL HIGH (ref 6.5–8.1)

## 2015-02-11 LAB — BASIC METABOLIC PANEL
Anion gap: 11 (ref 5–15)
BUN: 15 mg/dL (ref 6–20)
CO2: 26 mmol/L (ref 22–32)
CREATININE: 0.83 mg/dL (ref 0.44–1.00)
Calcium: 9.7 mg/dL (ref 8.9–10.3)
Chloride: 96 mmol/L — ABNORMAL LOW (ref 101–111)
Glucose, Bld: 538 mg/dL — ABNORMAL HIGH (ref 65–99)
POTASSIUM: 3.9 mmol/L (ref 3.5–5.1)
SODIUM: 133 mmol/L — AB (ref 135–145)

## 2015-02-11 LAB — CBC
HEMATOCRIT: 40.1 % (ref 36.0–46.0)
Hemoglobin: 13.4 g/dL (ref 12.0–15.0)
MCH: 28.9 pg (ref 26.0–34.0)
MCHC: 33.4 g/dL (ref 30.0–36.0)
MCV: 86.6 fL (ref 78.0–100.0)
PLATELETS: 256 10*3/uL (ref 150–400)
RBC: 4.63 MIL/uL (ref 3.87–5.11)
RDW: 12.8 % (ref 11.5–15.5)
WBC: 6.9 10*3/uL (ref 4.0–10.5)

## 2015-02-11 LAB — URINE MICROSCOPIC-ADD ON

## 2015-02-11 LAB — PREGNANCY, URINE: PREG TEST UR: NEGATIVE

## 2015-02-11 LAB — CBG MONITORING, ED
Glucose-Capillary: 457 mg/dL — ABNORMAL HIGH (ref 65–99)
Glucose-Capillary: 375 mg/dL — ABNORMAL HIGH (ref 65–99)

## 2015-02-11 LAB — LIPASE, BLOOD: LIPASE: 20 U/L (ref 11–51)

## 2015-02-11 MED ORDER — CEPHALEXIN 500 MG PO CAPS: 500.0000 mg | ORAL_CAPSULE | Freq: Two times a day (BID) | ORAL | Status: DC

## 2015-02-11 MED ORDER — HYDROMORPHONE HCL 1 MG/ML IJ SOLN
1.0000 mg | Freq: Once | INTRAMUSCULAR | Status: AC
  Administered 2015-02-11: 1 mg via INTRAVENOUS
  Filled 2015-02-11: qty 1

## 2015-02-11 MED ORDER — ONDANSETRON HCL 4 MG/2ML IJ SOLN
4.0000 mg | Freq: Once | INTRAMUSCULAR | Status: AC
  Administered 2015-02-11: 4 mg via INTRAVENOUS
  Filled 2015-02-11: qty 2

## 2015-02-11 MED ORDER — CEPHALEXIN 500 MG PO CAPS
500.0000 mg | ORAL_CAPSULE | Freq: Once | ORAL | Status: AC
  Administered 2015-02-12: 500 mg via ORAL
  Filled 2015-02-11: qty 1

## 2015-02-11 MED ORDER — INSULIN GLARGINE 100 UNIT/ML ~~LOC~~ SOLN
7.0000 [IU] | Freq: Once | SUBCUTANEOUS | Status: AC
  Administered 2015-02-12: 7 [IU] via SUBCUTANEOUS
  Filled 2015-02-11: qty 0.07

## 2015-02-11 MED ORDER — SODIUM CHLORIDE 0.9 % IV BOLUS (SEPSIS)
2000.0000 mL | Freq: Once | INTRAVENOUS | Status: AC
  Administered 2015-02-11: 2000 mL via INTRAVENOUS

## 2015-02-11 MED ORDER — PROMETHAZINE HCL 25 MG/ML IJ SOLN
25.0000 mg | Freq: Once | INTRAMUSCULAR | Status: AC
  Administered 2015-02-12: 25 mg via INTRAVENOUS
  Filled 2015-02-11: qty 1

## 2015-02-11 MED ORDER — HYDROMORPHONE HCL 1 MG/ML IJ SOLN
0.5000 mg | Freq: Once | INTRAMUSCULAR | Status: AC
  Administered 2015-02-11: 0.5 mg via INTRAVENOUS
  Filled 2015-02-11: qty 1

## 2015-02-11 NOTE — ED Provider Notes (Signed)
CSN: [id number]     Arrival date & time 02/11/15  1859 History   First MD Initiated Contact with Patient 02/11/15 2025     Chief Complaint  Patient presents with  . Emesis  . Diabetes  . Abdominal Pain     (Consider location/radiation/quality/duration/timing/severity/associated sxs/prior Treatment) HPI Comments: 34 year old female with past medical history including poorly controlled IDDM, GERD, gastroparesis, chronic diarrhea who presents with vomiting, abdominal pain, and hyperglycemia. Patient states that since last night she has had multiple episodes of nonbloody, nonbilious emesis associated with severe, constant generalized abdominal pain as well as diarrhea. She has a one-year history of chronic diarrhea; she denies any blood in her stool. Patient reports that her blood sugar is always labile but she started running very high blood sugar values today over 400 at home. She denies any fevers but does endorse some mild dysuria. No cough/cold symptoms or sick contacts.  Patient is a 34 y.o. female presenting with vomiting, diabetes problem, and abdominal pain. The history is provided by the patient.  Emesis Associated symptoms: abdominal pain   Diabetes Associated symptoms include abdominal pain.  Abdominal Pain Associated symptoms: vomiting     Past Medical History  Diagnosis Date  . DM type 1 causing complication ([place]) dx [id number]    hyperglycemia +/- DKA + coma, severe hypoglycemia, gastroparesis, peripheral neuropathy  . Thyroid disease     hypothyroidism associated with pregnancy  . Back pain   . PONV (postoperative nausea and vomiting)   . Anxiety     hx panic attacks.   Marland Kitchen GERD (gastroesophageal reflux disease)   . DM gastroparesis ([place])   . Anemia     .Autoimmune hemolytic anemia. bone marrow bx 2008, Dr Marin Olp  . Colitis, Clostridium difficile 05/2014  . Fatty liver 01/2007    with hepatomegaly on CT scan.    Past Surgical History  Procedure Laterality Date  .  Laparoscopy  02/2002, 06/2010    laparoscopy with lysis pelvic adhesions for pelvic pain  . Back surgery      age 57  . Tooth extraction    . Root canal  10/10/12  . Flexible sigmoidoscopy N/A 06/24/2014    Procedure: FLEXIBLE SIGMOIDOSCOPY;  Surgeon: Milus Banister, MD;  Location: [facility];  Service: Endoscopy;  Laterality: N/A;  . Esophagogastroduodenoscopy (egd) with propofol N/A 06/27/2014    Procedure: ESOPHAGOGASTRODUODENOSCOPY (EGD) WITH PROPOFOL;  Surgeon: Milus Banister, MD;  Location: [facility];  Service: Endoscopy;  Laterality: N/A;   Family History  Problem Relation Age of Onset  . Diabetes Maternal Grandmother   . Cancer Maternal Grandmother     Leukemia  . Cancer Maternal Grandfather     Small cell lung cancer   Social History  Substance Use Topics  . Smoking status: Former Smoker    Quit date: 04/19/2000  . Smokeless tobacco: Former Systems developer  . Alcohol Use: No     Comment: RARE SIPS OF WINE   OB History    No data available     Review of Systems  Gastrointestinal: Positive for vomiting and abdominal pain.   10 Systems reviewed and are negative for acute change except as noted in the HPI.    Allergies  Scallops; Diphenhydramine hcl; Haloperidol and related; Ketorolac; Morphine and related; Toradol; Latex; and Other  Home Medications   Prior to Admission medications   Medication Sig Start Date End Date Taking? Authorizing Provider  acetaminophen (TYLENOL) 500 MG tablet Take 1,000 mg by mouth every 6 (six)  hours as needed for moderate pain.    Yes Historical Provider, MD  ALPRAZolam Duanne Moron) 1 MG tablet Take 2 mg by mouth 2 (two) times daily as needed for anxiety.    Yes Historical Provider, MD  amitriptyline (ELAVIL) 50 MG tablet Take 2 tablets (100 mg total) by mouth at bedtime. 08/07/14  Yes Arnoldo Morale, MD  CALCIUM PO Take 2 tablets by mouth daily.   Yes Historical Provider, MD  cholestyramine Lucrezia Starch) 4 G packet Take 1 packet (4 g total) by mouth 2  (two) times daily. Patient taking differently: Take 4 g by mouth 4 (four) times daily.  09/19/14  Yes Jeffrey Hedges, PA-C  CREON 36000 UNITS CPEP capsule Take 4-6 capsules by mouth. Takes 6 capsules with every meal and 4 capsules with every snack. 12/02/14  Yes Historical Provider, MD  cyanocobalamin 500 MCG tablet Take 2,000 mcg by mouth 2 (two) times daily as needed (mouth sores).    Yes Historical Provider, MD  dicyclomine (BENTYL) 10 MG capsule Take 10 mg by mouth 4 (four) times daily -  before meals and at bedtime.   Yes Historical Provider, MD  diphenoxylate-atropine (LOMOTIL) 2.5-0.025 MG per tablet Take 1 tablet by mouth 4 (four) times daily as needed for diarrhea or loose stools. 11/08/14  Yes Orpah Greek, MD  Eluxadoline (VIBERZI) 100 MG TABS Take 100 mg by mouth 2 (two) times daily.   Yes Historical Provider, MD  feeding supplement, GLUCERNA SHAKE, (GLUCERNA SHAKE) LIQD Take 237 mLs by mouth 3 (three) times daily between meals. Patient taking differently: Take 237 mLs by mouth 4 (four) times daily.  07/06/14  Yes Thurnell Lose, MD  gabapentin (NEURONTIN) 100 MG capsule Take 300 mg by mouth at bedtime.    Yes Historical Provider, MD  glucagon (GLUCAGON EMERGENCY) 1 MG injection Inject 1 mg into the muscle once as needed (severe hypoglycemia). 08/14/14  Yes Arnoldo Morale, MD  insulin aspart (NOVOLOG FLEXPEN) 100 UNIT/ML FlexPen Inject 0-5 Units into the skin 3 (three) times daily as needed for high blood sugar.    Yes Historical Provider, MD  insulin glargine (LANTUS) 100 UNIT/ML injection Inject 0.07-0.08 mLs (7-8 Units total) into the skin 2 (two) times daily. 8 units in the morning and 7 units at night 01/15/15  Yes Velvet Bathe, MD  metoCLOPramide (REGLAN) 10 MG tablet Take 1 tablet (10 mg total) by mouth every 6 (six) hours as needed for nausea or vomiting. 01/15/15  Yes Velvet Bathe, MD  Multiple Vitamin (MULTIVITAMIN WITH MINERALS) TABS Take 2 tablets by mouth daily. Diabetic  support vitamin   Yes Historical Provider, MD  omeprazole (PRILOSEC) 40 MG capsule Take 40 mg by mouth daily as needed (indigestion).    Yes Historical Provider, MD  ondansetron (ZOFRAN ODT) 4 MG disintegrating tablet 4mg  ODT q4 hours prn nausea/vomit 11/28/14  Yes Milton Ferguson, MD  oxyCODONE-acetaminophen (PERCOCET) 10-325 MG tablet Take 0.5 tablets by mouth every 6 (six) hours as needed for pain.    Yes Historical Provider, MD  Potassium (POTASSIMIN PO) Take 2 capsules by mouth daily.   Yes Historical Provider, MD  Probiotic Product (DIGESTIVE ADVANTAGE GUMMIES PO) Take 2 tablets by mouth 2 (two) times daily.   Yes Historical Provider, MD  promethazine (PHENERGAN) 12.5 MG tablet Take 1 tablet (12.5 mg total) by mouth every 6 (six) hours as needed for nausea or vomiting. 01/15/15  Yes Velvet Bathe, MD  promethazine (PHENERGAN) 25 MG suppository Place 1 suppository (25 mg total) rectally every  6 (six) hours as needed for nausea or vomiting. 01/15/15  Yes Velvet Bathe, MD  saccharomyces boulardii (FLORASTOR) 250 MG capsule Take 1 capsule (250 mg total) by mouth 2 (two) times daily. 07/06/14  Yes Thurnell Lose, MD  traMADol (ULTRAM) 50 MG tablet Take 1 tablet (50 mg total) by mouth every 6 (six) hours as needed. 01/10/15  Yes Daleen Bo, MD  cephALEXin (KEFLEX) 500 MG capsule Take 1 capsule (500 mg total) by mouth 2 (two) times daily. 02/11/15   Wenda Overland Jamiya Nims, MD   BP 114/82 mmHg  Pulse 105  Temp(Src) 98 F (36.7 C) (Oral)  Resp 21  SpO2 100% Physical Exam  Constitutional: She is oriented to person, place, and time.  Thin woman, uncomfortable but NAD  HENT:  Head: Normocephalic and atraumatic.  dry mucous membranes  Eyes: Conjunctivae are normal. Pupils are equal, round, and reactive to light.  Neck: Neck supple.  Cardiovascular: Regular rhythm and normal heart sounds.   No murmur heard. tachycardia  Pulmonary/Chest: Effort normal and breath sounds normal.  Abdominal: Soft.  Bowel sounds are normal. She exhibits no distension. There is no rebound and no guarding.  Mild generalized TTP, no focal tenderness  Musculoskeletal: She exhibits no edema.  Neurological: She is alert and oriented to person, place, and time.  Fluent speech  Skin: Skin is warm and dry.  Psychiatric: Judgment normal.  Mildly distressed  Nursing note and vitals reviewed.   ED Course  Procedures (including critical care time) Labs Review Labs Reviewed  BASIC METABOLIC PANEL - Abnormal; Notable for the following:    Sodium 133 (*)    Chloride 96 (*)    Glucose, Bld 538 (*)    All other components within normal limits  URINALYSIS, ROUTINE W REFLEX MICROSCOPIC (NOT AT [facility]) - Abnormal; Notable for the following:    Specific Gravity, Urine 1.046 (*)    Glucose, UA >1000 (*)    All other components within normal limits  URINE MICROSCOPIC-ADD ON - Abnormal; Notable for the following:    Squamous Epithelial / LPF 0-5 (*)    Bacteria, UA RARE (*)    All other components within normal limits  HEPATIC FUNCTION PANEL - Abnormal; Notable for the following:    Total Protein 8.2 (*)    AST 42 (*)    Bilirubin, Direct <0.1 (*)    All other components within normal limits  CBG MONITORING, ED - Abnormal; Notable for the following:    Glucose-Capillary 457 (*)    All other components within normal limits  CBG MONITORING, ED - Abnormal; Notable for the following:    Glucose-Capillary 375 (*)    All other components within normal limits  URINE CULTURE  CBC  LIPASE, BLOOD  PREGNANCY, URINE    Imaging Review No results found. I have personally reviewed and evaluated these lab results as part of my medical decision-making.   EKG Interpretation None     Medications  sodium chloride 0.9 % bolus 2,000 mL (0 mLs Intravenous Stopped 02/12/15 0032)  ondansetron (ZOFRAN) injection 4 mg (4 mg Intravenous Given 02/11/15 2044)  ondansetron (ZOFRAN) injection 4 mg (4 mg Intravenous Given 02/11/15  2114)  HYDROmorphone (DILAUDID) injection 0.5 mg (0.5 mg Intravenous Given 02/11/15 2114)  cephALEXin (KEFLEX) capsule 500 mg (500 mg Oral Given 02/12/15 0011)  HYDROmorphone (DILAUDID) injection 1 mg (1 mg Intravenous Given 02/11/15 2233)  insulin glargine (LANTUS) injection 7 Units (7 Units Subcutaneous Given 02/12/15 0031)  promethazine (PHENERGAN) injection 25  mg (25 mg Intravenous Given 02/12/15 0031)  oxyCODONE-acetaminophen (PERCOCET/ROXICET) 5-325 MG per tablet 1 tablet (1 tablet Oral Given 02/12/15 0031)    MDM   Final diagnoses:  Hyperglycemia  UTI (lower urinary tract infection)  Chronic diarrhea   Pt w/ poorly controlled IDDM p/w hyperglycemia and one day of vomiting associated with generalized abdominal pain and chronic diarrhea. On exam, the patient was uncomfortable but nontoxic in appearance. She was tachycardic but with normal blood pressure, afebrile. No focal abdominal tenderness. Obtained above labs which were notable for glucose 538, normal anion gap, normal bicarbonate, UA without ketones. Labs consistent with hyperglycemia without DKA. She has some white blood cells in her urine with some bacteria. Given her report of dysuria and her poorly controlled blood glucose, I elected to treat her with Keflex, urine culture sent. Gave the patient Dilaudid, Zofran, and 2 L of IV fluids. She initially requested Dilaudid and Phenergan but I declined to give both simultaneously due to concerns for oversedation. Later gave Phenergan and on reexamination, the patient was sitting up in bed and interactive with her daughter and well-appearing. She had been able to tolerate liquids here. . Repeat blood glucose improved at 375. Gave night dose of Lantus. She continued to request IV narcotics prior to discharge as well as IV Phenergan, which was concerning for drug-seeking behavior. Gave one dose of Phenergan and Percocet at time of discharge. Instructed to contact her endocrinologist in the  morning to discuss ED presentation. Patient voiced understanding of this plan as well as return precautions. Patient discharged in satisfactory condition.   Sharlett Iles, MD 02/12/15 [phone]

## 2015-02-11 NOTE — ED Notes (Signed)
Patient is brittle diabetic, reports home CBG have been 400+, 15 episodes of emesis, 20+ episodes of diarrhea, and generalized abdominal pain.

## 2015-02-11 NOTE — ED Notes (Signed)
x2 unsuccessful PIV starts by this RN - Nilsa Nutting, Charge RN at bedside for assistance.

## 2015-02-12 MED ORDER — OXYCODONE-ACETAMINOPHEN 5-325 MG PO TABS
1.0000 | ORAL_TABLET | Freq: Once | ORAL | Status: AC
  Administered 2015-02-12: 1 via ORAL
  Filled 2015-02-12: qty 1

## 2015-02-13 LAB — URINE CULTURE: SPECIAL REQUESTS: NORMAL

## 2015-03-11 ENCOUNTER — Encounter (HOSPITAL_COMMUNITY): Payer: Self-pay

## 2015-03-11 ENCOUNTER — Emergency Department (HOSPITAL_COMMUNITY): Payer: Medicaid Other

## 2015-03-11 ENCOUNTER — Inpatient Hospital Stay (HOSPITAL_COMMUNITY)
Admission: EM | Admit: 2015-03-11 | Discharge: 2015-03-12 | DRG: 073 | Disposition: A | Discharge status: Home / Self Care | Payer: Medicaid Other | Attending: Family Medicine | Admitting: Family Medicine

## 2015-03-11 DIAGNOSIS — E1065 Type 1 diabetes mellitus with hyperglycemia: Secondary | ICD-10-CM | POA: Diagnosis present

## 2015-03-11 DIAGNOSIS — E870 Hyperosmolality and hypernatremia: Secondary | ICD-10-CM | POA: Diagnosis present

## 2015-03-11 DIAGNOSIS — Z87891 Personal history of nicotine dependence: Secondary | ICD-10-CM | POA: Diagnosis not present

## 2015-03-11 DIAGNOSIS — Z794 Long term (current) use of insulin: Secondary | ICD-10-CM | POA: Diagnosis not present

## 2015-03-11 DIAGNOSIS — Z833 Family history of diabetes mellitus: Secondary | ICD-10-CM

## 2015-03-11 DIAGNOSIS — Z9114 Patient's other noncompliance with medication regimen: Secondary | ICD-10-CM

## 2015-03-11 DIAGNOSIS — F4323 Adjustment disorder with mixed anxiety and depressed mood: Secondary | ICD-10-CM | POA: Diagnosis present

## 2015-03-11 DIAGNOSIS — R197 Diarrhea, unspecified: Secondary | ICD-10-CM | POA: Diagnosis present

## 2015-03-11 DIAGNOSIS — E1365 Other specified diabetes mellitus with hyperglycemia: Secondary | ICD-10-CM

## 2015-03-11 DIAGNOSIS — K909 Intestinal malabsorption, unspecified: Secondary | ICD-10-CM | POA: Diagnosis present

## 2015-03-11 DIAGNOSIS — K3184 Gastroparesis: Secondary | ICD-10-CM | POA: Diagnosis present

## 2015-03-11 DIAGNOSIS — Z681 Body mass index (BMI) 19 or less, adult: Secondary | ICD-10-CM

## 2015-03-11 DIAGNOSIS — E1043 Type 1 diabetes mellitus with diabetic autonomic (poly)neuropathy: Secondary | ICD-10-CM | POA: Diagnosis present

## 2015-03-11 DIAGNOSIS — E43 Unspecified severe protein-calorie malnutrition: Secondary | ICD-10-CM | POA: Diagnosis present

## 2015-03-11 DIAGNOSIS — R112 Nausea with vomiting, unspecified: Secondary | ICD-10-CM

## 2015-03-11 DIAGNOSIS — Z79899 Other long term (current) drug therapy: Secondary | ICD-10-CM | POA: Diagnosis not present

## 2015-03-11 DIAGNOSIS — E1101 Type 2 diabetes mellitus with hyperosmolarity with coma: Secondary | ICD-10-CM | POA: Diagnosis present

## 2015-03-11 DIAGNOSIS — E875 Hyperkalemia: Secondary | ICD-10-CM | POA: Diagnosis present

## 2015-03-11 DIAGNOSIS — R739 Hyperglycemia, unspecified: Secondary | ICD-10-CM | POA: Diagnosis present

## 2015-03-11 DIAGNOSIS — E109 Type 1 diabetes mellitus without complications: Secondary | ICD-10-CM

## 2015-03-11 DIAGNOSIS — K8681 Exocrine pancreatic insufficiency: Secondary | ICD-10-CM

## 2015-03-11 DIAGNOSIS — E1340 Other specified diabetes mellitus with diabetic neuropathy, unspecified: Secondary | ICD-10-CM

## 2015-03-11 DIAGNOSIS — Z9119 Patient's noncompliance with other medical treatment and regimen: Secondary | ICD-10-CM

## 2015-03-11 DIAGNOSIS — Z91199 Patient's noncompliance with other medical treatment and regimen due to unspecified reason: Secondary | ICD-10-CM

## 2015-03-11 DIAGNOSIS — E440 Moderate protein-calorie malnutrition: Secondary | ICD-10-CM | POA: Insufficient documentation

## 2015-03-11 LAB — URINALYSIS, ROUTINE W REFLEX MICROSCOPIC
Bilirubin Urine: NEGATIVE
Hgb urine dipstick: NEGATIVE
KETONES UR: NEGATIVE mg/dL
LEUKOCYTES UA: NEGATIVE
NITRITE: NEGATIVE
PH: 8 (ref 5.0–8.0)
Protein, ur: NEGATIVE mg/dL
SPECIFIC GRAVITY, URINE: 1.035 — AB (ref 1.005–1.030)

## 2015-03-11 LAB — COMPREHENSIVE METABOLIC PANEL
ALBUMIN: 4.1 g/dL (ref 3.5–5.0)
ALT: 77 U/L — ABNORMAL HIGH (ref 14–54)
ANION GAP: 10 (ref 5–15)
AST: 163 U/L — AB (ref 15–41)
Alkaline Phosphatase: 82 U/L (ref 38–126)
BILIRUBIN TOTAL: 0.5 mg/dL (ref 0.3–1.2)
BUN: 10 mg/dL (ref 6–20)
CHLORIDE: 91 mmol/L — AB (ref 101–111)
CO2: 30 mmol/L (ref 22–32)
Calcium: 9.7 mg/dL (ref 8.9–10.3)
Creatinine, Ser: 0.69 mg/dL (ref 0.44–1.00)
GFR calc Af Amer: >60 mL/min (ref >60)
Glucose, Bld: 614 mg/dL (ref 65–99)
POTASSIUM: 5.2 mmol/L — AB (ref 3.5–5.1)
Sodium: 131 mmol/L — ABNORMAL LOW (ref 135–145)
TOTAL PROTEIN: 7.4 g/dL (ref 6.5–8.1)

## 2015-03-11 LAB — POC URINE PREG, ED: Preg Test, Ur: NEGATIVE

## 2015-03-11 LAB — CBC
HEMATOCRIT: 35.9 % — AB (ref 36.0–46.0)
HEMOGLOBIN: 11.9 g/dL — AB (ref 12.0–15.0)
MCH: 27.9 pg (ref 26.0–34.0)
MCHC: 33.1 g/dL (ref 30.0–36.0)
MCV: 84.3 fL (ref 78.0–100.0)
Platelets: 277 10*3/uL (ref 150–400)
RBC: 4.26 MIL/uL (ref 3.87–5.11)
RDW: 12.7 % (ref 11.5–15.5)
WBC: 8.4 10*3/uL (ref 4.0–10.5)

## 2015-03-11 LAB — I-STAT CG4 LACTIC ACID, ED
LACTIC ACID, VENOUS: 1.18 mmol/L (ref 0.5–2.0)
Lactic Acid, Venous: 1.98 mmol/L (ref 0.5–2.0)

## 2015-03-11 LAB — CBG MONITORING, ED
GLUCOSE-CAPILLARY: 223 mg/dL — AB (ref 65–99)
GLUCOSE-CAPILLARY: 310 mg/dL — AB (ref 65–99)
Glucose-Capillary: 448 mg/dL — ABNORMAL HIGH (ref 65–99)
Glucose-Capillary: 432 mg/dL — ABNORMAL HIGH (ref 65–99)

## 2015-03-11 LAB — C DIFFICILE QUICK SCREEN W PCR REFLEX
C DIFFICILE (CDIFF) INTERP: NEGATIVE
C Diff antigen: NEGATIVE
C Diff toxin: NEGATIVE

## 2015-03-11 LAB — URINE MICROSCOPIC-ADD ON: RBC / HPF: NONE SEEN RBC/hpf (ref 0–5)

## 2015-03-11 LAB — LIPASE, BLOOD: LIPASE: 11 U/L (ref 11–51)

## 2015-03-11 MED ORDER — HYDROMORPHONE HCL 2 MG/ML IJ SOLN
INTRAMUSCULAR | Status: AC
  Administered 2015-03-11: 1 mg
  Filled 2015-03-11: qty 1

## 2015-03-11 MED ORDER — HYDROMORPHONE HCL 1 MG/ML IJ SOLN
1.0000 mg | Freq: Once | INTRAMUSCULAR | Status: AC
  Administered 2015-03-11: 1 mg via INTRAVENOUS
  Filled 2015-03-11: qty 1

## 2015-03-11 MED ORDER — PANCRELIPASE (LIP-PROT-AMYL) 36000-114000 UNITS PO CPEP
6.0000 | ORAL_CAPSULE | Freq: Three times a day (TID) | ORAL | Status: DC
  Administered 2015-03-12 (×2): 216000 [IU] via ORAL
  Filled 2015-03-11 (×6): qty 6

## 2015-03-11 MED ORDER — INSULIN ASPART 100 UNIT/ML ~~LOC~~ SOLN: 0.0000 [IU] | Freq: Three times a day (TID) | SUBCUTANEOUS | Status: DC

## 2015-03-11 MED ORDER — SACCHAROMYCES BOULARDII 250 MG PO CAPS
250.0000 mg | ORAL_CAPSULE | Freq: Two times a day (BID) | ORAL | Status: DC
  Administered 2015-03-11 – 2015-03-12 (×2): 250 mg via ORAL
  Filled 2015-03-11 (×4): qty 1

## 2015-03-11 MED ORDER — PROMETHAZINE HCL 25 MG/ML IJ SOLN
25.0000 mg | Freq: Four times a day (QID) | INTRAMUSCULAR | Status: DC | PRN
  Administered 2015-03-11 – 2015-03-12 (×2): 25 mg via INTRAVENOUS
  Filled 2015-03-11 (×2): qty 1

## 2015-03-11 MED ORDER — PROMETHAZINE HCL 25 MG/ML IJ SOLN
25.0000 mg | Freq: Once | INTRAMUSCULAR | Status: AC
  Administered 2015-03-11: 25 mg via INTRAVENOUS
  Filled 2015-03-11: qty 1

## 2015-03-11 MED ORDER — ALPRAZOLAM 1 MG PO TABS
2.0000 mg | ORAL_TABLET | Freq: Two times a day (BID) | ORAL | Status: DC | PRN
  Administered 2015-03-12: 2 mg via ORAL
  Filled 2015-03-11: qty 4

## 2015-03-11 MED ORDER — VITAMIN B-12 1000 MCG PO TABS
2000.0000 ug | ORAL_TABLET | Freq: Two times a day (BID) | ORAL | Status: DC | PRN
  Filled 2015-03-11: qty 2

## 2015-03-11 MED ORDER — DEXTROSE-NACL 5-0.45 % IV SOLN: INTRAVENOUS | Status: DC

## 2015-03-11 MED ORDER — CHOLESTYRAMINE 4 G PO PACK
4.0000 g | PACK | Freq: Four times a day (QID) | ORAL | Status: DC
  Administered 2015-03-11 – 2015-03-12 (×3): 4 g via ORAL
  Filled 2015-03-11 (×9): qty 1

## 2015-03-11 MED ORDER — HYDROMORPHONE HCL 1 MG/ML IJ SOLN
1.0000 mg | INTRAMUSCULAR | Status: DC | PRN
  Administered 2015-03-11 – 2015-03-12 (×5): 1 mg via INTRAVENOUS
  Filled 2015-03-11 (×4): qty 1

## 2015-03-11 MED ORDER — AMITRIPTYLINE HCL 100 MG PO TABS
100.0000 mg | ORAL_TABLET | Freq: Every day | ORAL | Status: DC
  Administered 2015-03-11: 100 mg via ORAL
  Filled 2015-03-11: qty 4
  Filled 2015-03-11: qty 1

## 2015-03-11 MED ORDER — METOCLOPRAMIDE HCL 5 MG/ML IJ SOLN
5.0000 mg | Freq: Three times a day (TID) | INTRAMUSCULAR | Status: DC
  Administered 2015-03-11 – 2015-03-12 (×2): 5 mg via INTRAVENOUS
  Filled 2015-03-11 (×3): qty 2

## 2015-03-11 MED ORDER — SODIUM CHLORIDE 0.9 % IV SOLN
INTRAVENOUS | Status: DC
  Filled 2015-03-11: qty 2.5

## 2015-03-11 MED ORDER — IOHEXOL 300 MG/ML  SOLN
100.0000 mL | Freq: Once | INTRAMUSCULAR | Status: AC | PRN
Start: 2015-03-11 — End: 2015-03-11
  Administered 2015-03-11: 100 mL via INTRAVENOUS

## 2015-03-11 MED ORDER — HYDROMORPHONE HCL 1 MG/ML IJ SOLN
1.0000 mg | Freq: Once | INTRAMUSCULAR | Status: DC
  Filled 2015-03-11: qty 1

## 2015-03-11 MED ORDER — OXYCODONE-ACETAMINOPHEN 5-325 MG PO TABS
1.0000 | ORAL_TABLET | Freq: Four times a day (QID) | ORAL | Status: DC | PRN
  Administered 2015-03-12 (×2): 1 via ORAL
  Filled 2015-03-11 (×3): qty 1

## 2015-03-11 MED ORDER — DICYCLOMINE HCL 10 MG PO CAPS
10.0000 mg | ORAL_CAPSULE | Freq: Three times a day (TID) | ORAL | Status: DC
  Administered 2015-03-12 (×2): 10 mg via ORAL
  Filled 2015-03-11 (×6): qty 1

## 2015-03-11 MED ORDER — SODIUM CHLORIDE 0.9 % IV BOLUS (SEPSIS)
1000.0000 mL | Freq: Once | INTRAVENOUS | Status: AC
  Administered 2015-03-11: 1000 mL via INTRAVENOUS

## 2015-03-11 MED ORDER — SODIUM CHLORIDE 0.9 % IV SOLN
INTRAVENOUS | Status: DC
  Administered 2015-03-11: 3.9 [IU]/h via INTRAVENOUS
  Filled 2015-03-11: qty 2.5

## 2015-03-11 MED ORDER — INSULIN GLARGINE 100 UNIT/ML ~~LOC~~ SOLN
7.0000 [IU] | Freq: Two times a day (BID) | SUBCUTANEOUS | Status: DC
  Administered 2015-03-11 – 2015-03-12 (×2): 7 [IU] via SUBCUTANEOUS
  Filled 2015-03-11 (×3): qty 0.07

## 2015-03-11 MED ORDER — SODIUM CHLORIDE 0.9 % IV SOLN
INTRAVENOUS | Status: DC
  Administered 2015-03-11: 23:00:00 via INTRAVENOUS

## 2015-03-11 MED ORDER — INSULIN ASPART 100 UNIT/ML ~~LOC~~ SOLN
0.0000 [IU] | Freq: Every day | SUBCUTANEOUS | Status: DC
  Administered 2015-03-11: 4 [IU] via SUBCUTANEOUS
  Filled 2015-03-11: qty 1

## 2015-03-11 MED ORDER — OXYCODONE-ACETAMINOPHEN 10-325 MG PO TABS: 0.5000 | ORAL_TABLET | Freq: Four times a day (QID) | ORAL | Status: DC | PRN

## 2015-03-11 MED ORDER — INSULIN ASPART 100 UNIT/ML ~~LOC~~ SOLN
3.0000 [IU] | Freq: Three times a day (TID) | SUBCUTANEOUS | Status: DC
  Administered 2015-03-12 (×2): 3 [IU] via SUBCUTANEOUS

## 2015-03-11 MED ORDER — PANTOPRAZOLE SODIUM 40 MG PO TBEC
40.0000 mg | DELAYED_RELEASE_TABLET | Freq: Every day | ORAL | Status: DC
  Administered 2015-03-12: 40 mg via ORAL
  Filled 2015-03-11: qty 1

## 2015-03-11 MED ORDER — GABAPENTIN 300 MG PO CAPS
300.0000 mg | ORAL_CAPSULE | Freq: Every day | ORAL | Status: DC
  Administered 2015-03-12: 300 mg via ORAL
  Filled 2015-03-11 (×2): qty 1

## 2015-03-11 MED ORDER — DEXTROSE-NACL 5-0.45 % IV SOLN
INTRAVENOUS | Status: DC
  Administered 2015-03-12: 03:00:00 via INTRAVENOUS

## 2015-03-11 MED ORDER — ADULT MULTIVITAMIN W/MINERALS CH
2.0000 | ORAL_TABLET | Freq: Every day | ORAL | Status: DC
Start: 2015-03-12 — End: 2015-03-12
  Administered 2015-03-12: 2 via ORAL
  Filled 2015-03-11: qty 2

## 2015-03-11 NOTE — ED Notes (Addendum)
Pt presents with c/o vomiting, diarrhea, and hyperglycemia. Pt reports that she really started having diarrhea and vomiting yesterday that has gotten substantially worse. Pt is also a diabetic, reports that she took her blood sugar this morning and it was too high to read, took some insulin, unsure of what her blood sugar is at this time.

## 2015-03-11 NOTE — Progress Notes (Addendum)
EDCM went to speak to patient however RN at bedside.  1900pm EDCM spoke to patient at bedside. Patient noted to have been seen in the ED 15 times within the last six months.  Patient reports she has been taking her insulin as prescribed.  She reports her pcp is Dr. Sheryle Hail and has an appointment with him on 01/23.  Patient's endocrinologist is Dr. Nicole Cella at [facility] and has ana ppointment with him on 01/25.  Patient's GI specialist id Dr. Lyndel Safe in [place].  The medication the patient is having difficulty affording is Viberzi for IBS.  Patient reports Medicaid will not pay for it.  Patient reports her GI specialist is running out of options for her.  Her GI specialist has given her samples of the medication.

## 2015-03-11 NOTE — ED Notes (Signed)
Delay in lab draw, nurse wants to give pt meds first

## 2015-03-11 NOTE — Progress Notes (Signed)
Addendum: CBGs in 200 so we can stop insulin drip and transitioned to subcutaneous insulin Order changed for admission to telemetry  Leisa Lenz Escobares

## 2015-03-11 NOTE — ED Notes (Signed)
Pt reports lower abd cramping, nausea and elevated CBG.  Pt has DM type I.  Pt also reports diarrhea and has been having diarrhea for a year now and has been checked for c-diff.

## 2015-03-11 NOTE — H&P (Signed)
[facility] History and Physical  BOBBETTE UFFORD [id number] DOB: 03/25/1980 DOA: 03/11/2015  Referring physician: ER PA: Lorre Munroe  PCP: [name], MD  Chief Complaint: nausea, vomiting, dairrhea  HPI:  35 year old female with past medical history of diabetes mellitus type 1 on insulin, related gastroparesis, noncompliance with insulin who presented to [facility] long hospital with reports of abdominal discomfort, nausea, vomiting and diarrhea for past 2 days prior to this admission. No reports of fevers or chills. No reports of blood in the stool. No reports of chest pain, shortness of breath or palpitations. No reports of dysuria or hematuria. No blurry vision.   In ED, BP was 110/79, HR 87, RR 17, afebrile and oxygen saturation was 99% on room air. Blood work showed glucose of 614. CBG showed glucose of more than 600. No ketones on UA. CT abdomen showed distention of the stomach and proximal duodenum, likely responsible for the patient's nausea and vomiting. She was given phenergan in ED with some symptomatic relief. Since she requires insulin drip she is admitted to SDU.   Assessment & Plan    Principal Problem:   Hyperglycemic hyperosmolar nonketotic coma ([place]) - CBG of more than 600, g/ucose of 614 on BMP but normal AG, no ketones on urinalysis  - Started on insulin drip due to high CBG's - Admission to SDU since she requires insulin drip - BMP and CBG per DKA protocol - Continue IV fludis  Active Problems:   Uncontrolled diabetes mellitus with diabetic neuropathy, with long-term current use of insulin ([place]) / Personal history of noncompliance with medical treatment - A1c in 12/2014 was 12.6 indicating poor glycemic control - Insulin drip for now and once CBG's less tahn 250 may transition to subQ insulin - Encourage compliance     Diabetic gastroparesis associated with type 1 diabetes mellitus ([place]) - Continue gabapentin - Started Reglan IV every 8 hours  scheduled - Use as needed phenergan    Protein-calorie malnutrition, severe ([place])  - Nutrition consulted     Adjustment disorder with mixed anxiety and depressed mood - Continue elvail and xanax    Exocrine pancreatic insufficiency ([facility]) - Continue pancreatic lipase supplementation    Diarrhea due to malabsorption - C.diff engative    Hyperkalemia - Due to DKA - Will correct with insulin - F/U BMP per DKA protocol    DVT prophylaxis:  - SCD's bilaterally   Radiological Exams on Admission: Ct Abdomen Pelvis W Contrast 03/11/2015  1. Distention of the stomach and proximal duodenum, likely responsible for the patient's nausea and vomiting. 2. The colon is significantly improved in appearance in the interval. The previously seen pneumatosis has almost completely resolved. There is no colonic wall thickening or pericolonic stranding. Electronically Signed   By: Dorise Bullion III M.D   On: 03/11/2015 17:15   Code Status: Full Family Communication: Plan of care discussed with the patient  Disposition Plan: Admit for further evaluation, SDU since she requires insulin drip   Leisa Lenz, MD  [facility] Pager [phone]  Time spent in minutes: 75 minutes  Review of Systems:  Constitutional: Negative for fever, chills and malaise/fatigue. Negative for diaphoresis.  HENT: Negative for hearing loss, ear pain, nosebleeds, congestion, sore throat, neck pain, tinnitus and ear discharge.   Eyes: Negative for blurred vision, double vision, photophobia, pain, discharge and redness.  Respiratory: Negative for cough, hemoptysis, sputum production, shortness of breath, wheezing and stridor.   Cardiovascular: Negative for chest pain, palpitations, orthopnea, claudication and leg swelling.  Gastrointestinal: per HPI Genitourinary: Negative for dysuria, urgency, frequency, hematuria and flank pain.  Musculoskeletal: Negative for myalgias, back pain, joint pain and falls.  Skin: Negative  for itching and rash.  Neurological: Negative for dizziness and weakness. Negative for tingling, tremors, sensory change, speech change, focal weakness, loss of consciousness and headaches.  Endo/Heme/Allergies: Negative for environmental allergies and polydipsia. Does not bruise/bleed easily.  Psychiatric/Behavioral: Negative for suicidal ideas. The patient is not nervous/anxious.      History reviewed. No pertinent past medical history. Past Surgical History  Procedure Laterality Date  . Laparoscopy  02/2002, 06/2010    laparoscopy with lysis pelvic adhesions for pelvic pain  . Back surgery      age 66  . Tooth extraction    . Root canal  10/10/12  . Flexible sigmoidoscopy N/A 06/24/2014    Procedure: FLEXIBLE SIGMOIDOSCOPY;  Surgeon: Milus Banister, MD;  Location: [facility] ENDOSCOPY;  Service: Endoscopy;  Laterality: N/A;  . Esophagogastroduodenoscopy (egd) with propofol N/A 06/27/2014    Procedure: ESOPHAGOGASTRODUODENOSCOPY (EGD) WITH PROPOFOL;  Surgeon: Milus Banister, MD;  Location: [facility] ENDOSCOPY;  Service: Endoscopy;  Laterality: N/A;   Social History:  reports that she quit smoking about 14 years ago. She has quit using smokeless tobacco. She reports that she does not drink alcohol or use illicit drugs.  Allergies  Allergen Reactions  . Scallops [Shellfish Allergy] Anaphylaxis  . Diphenhydramine Hcl Nausea Only    IV diphenhydramine.  Oral capsules ok.    . Haloperidol And Related Itching    Pt wanted to rip skin off  . Ketorolac Itching  . Morphine And Related Itching  . Toradol [Ketorolac Tromethamine]     Pt wanted to rip skin off  . Latex Rash  . Other Rash    Sunscreen and grass    Family History:  Family History  Problem Relation Age of Onset  . Diabetes Maternal Grandmother   . Cancer Maternal Grandmother     Leukemia  . Cancer Maternal Grandfather     Small cell lung cancer     Prior to Admission medications   Medication Sig Start Date End Date Taking?  Authorizing Provider  acetaminophen (TYLENOL) 500 MG tablet Take 1,000 mg by mouth every 6 (six) hours as needed for moderate pain.    Yes Historical Provider, MD  ALPRAZolam Duanne Moron) 1 MG tablet Take 2 mg by mouth 2 (two) times daily as needed for anxiety.    Yes Historical Provider, MD  amitriptyline (ELAVIL) 50 MG tablet Take 2 tablets (100 mg total) by mouth at bedtime. 08/07/14  Yes Arnoldo Morale, MD  CALCIUM PO Take 2 tablets by mouth daily.   Yes Historical Provider, MD  cholestyramine Lucrezia Starch) 4 G packet Take 1 packet (4 g total) by mouth 2 (two) times daily. Patient taking differently: Take 4 g by mouth 4 (four) times daily.  09/19/14  Yes Jeffrey Hedges, PA-C  CREON 36000 UNITS CPEP capsule Take 4-6 capsules by mouth. Takes 6 capsules with every meal and 4 capsules with every snack. 12/02/14  Yes Historical Provider, MD  cyanocobalamin 500 MCG tablet Take 2,000 mcg by mouth 2 (two) times daily as needed (mouth sores).    Yes Historical Provider, MD  dicyclomine (BENTYL) 10 MG capsule Take 10 mg by mouth 4 (four) times daily -  before meals and at bedtime.   Yes Historical Provider, MD  diphenoxylate-atropine (LOMOTIL) 2.5-0.025 MG per tablet Take 1 tablet by mouth 4 (four) times daily as needed  for diarrhea or loose stools. 11/08/14  Yes Orpah Greek, MD  feeding supplement, GLUCERNA SHAKE, (GLUCERNA SHAKE) LIQD Take 237 mLs by mouth 3 (three) times daily between meals. Patient taking differently: Take 237 mLs by mouth 4 (four) times daily.  07/06/14  Yes Thurnell Lose, MD  gabapentin (NEURONTIN) 100 MG capsule Take 300 mg by mouth at bedtime.    Yes Historical Provider, MD  glucagon (GLUCAGON EMERGENCY) 1 MG injection Inject 1 mg into the muscle once as needed (severe hypoglycemia). 08/14/14  Yes Arnoldo Morale, MD  insulin aspart (NOVOLOG FLEXPEN) 100 UNIT/ML FlexPen Inject 0-5 Units into the skin 3 (three) times daily as needed for high blood sugar.    Yes Historical Provider, MD   insulin glargine (LANTUS) 100 UNIT/ML injection Inject 0.07-0.08 mLs (7-8 Units total) into the skin 2 (two) times daily. 8 units in the morning and 7 units at night 01/15/15  Yes Velvet Bathe, MD  metoCLOPramide (REGLAN) 10 MG tablet Take 1 tablet (10 mg total) by mouth every 6 (six) hours as needed for nausea or vomiting. 01/15/15  Yes Velvet Bathe, MD  Multiple Vitamin (MULTIVITAMIN WITH MINERALS) TABS Take 2 tablets by mouth daily. Diabetic support vitamin   Yes Historical Provider, MD  omeprazole (PRILOSEC) 40 MG capsule Take 40 mg by mouth daily as needed (indigestion).    Yes Historical Provider, MD  oxyCODONE-acetaminophen (PERCOCET) 10-325 MG tablet Take 0.5 tablets by mouth every 6 (six) hours as needed for pain.    Yes Historical Provider, MD  Potassium (POTASSIMIN PO) Take 2 capsules by mouth daily.   Yes Historical Provider, MD  Probiotic Product (DIGESTIVE ADVANTAGE GUMMIES PO) Take 2 tablets by mouth 2 (two) times daily.   Yes Historical Provider, MD  promethazine (PHENERGAN) 25 MG suppository Place 1 suppository (25 mg total) rectally every 6 (six) hours as needed for nausea or vomiting. 01/15/15  Yes Velvet Bathe, MD  promethazine (PHENERGAN) 25 MG tablet Take 25 mg by mouth every 6 (six) hours as needed for nausea or vomiting.   Yes Historical Provider, MD  saccharomyces boulardii (FLORASTOR) 250 MG capsule Take 1 capsule (250 mg total) by mouth 2 (two) times daily. 07/06/14  Yes Thurnell Lose, MD  cephALEXin (KEFLEX) 500 MG capsule Take 1 capsule (500 mg total) by mouth 2 (two) times daily. 02/11/15   Wenda Overland Little, MD  Eluxadoline (VIBERZI) 100 MG TABS Take 100 mg by mouth 2 (two) times daily.    Historical Provider, MD  ondansetron (ZOFRAN ODT) 4 MG disintegrating tablet 4mg  ODT q4 hours prn nausea/vomit 11/28/14   Milton Ferguson, MD  promethazine (PHENERGAN) 12.5 MG tablet Take 1 tablet (12.5 mg total) by mouth every 6 (six) hours as needed for nausea or vomiting. 01/15/15    Velvet Bathe, MD  traMADol (ULTRAM) 50 MG tablet Take 1 tablet (50 mg total) by mouth every 6 (six) hours as needed. 01/10/15   Daleen Bo, MD   Physical Exam: Filed Vitals:   03/11/15 1426 03/11/15 1523 03/11/15 1655 03/11/15 1736  BP: 101/75 92/69 110/79 94/71  Pulse: 87 105 95 93  Temp: 98.1 F (36.7 C) 98.6 F (37 C) 98.7 F (37.1 C) 98.8 F (37.1 C)  TempSrc: Oral Oral Oral Oral  Resp: 19 19 18 17   SpO2: 99% 100% 100% 100%    Physical Exam  Constitutional: Appears well-developed and well-nourished. No distress.  HENT: Normocephalic. No tonsillar erythema or exudates Eyes: Conjunctivae are normal. No scleral icterus.  Neck:  Normal ROM. Neck supple. No JVD. No tracheal deviation. No thyromegaly.  CVS: RRR, S1/S2 +, no murmurs, no gallops, no carotid bruit.  Pulmonary: Effort and breath sounds normal, no stridor, rhonchi, wheezes, rales.  Abdominal: Soft. BS +,  no distension, tenderness, rebound or guarding.  Musculoskeletal: Normal range of motion. No edema and no tenderness.  Lymphadenopathy: No lymphadenopathy noted, cervical, inguinal. Neuro: Alert. Normal reflexes, muscle tone coordination. No focal neurologic deficits. Skin: Skin is warm and dry. No rash noted.  No erythema. No pallor.  Psychiatric: Normal mood and affect. Behavior, judgment, thought content normal.   Labs on Admission:  Basic Metabolic Panel:  Recent Labs Lab 03/11/15 1218  NA 131*  K 5.2*  CL 91*  CO2 30  GLUCOSE 614*  BUN 10  CREATININE 0.69  CALCIUM 9.7   Liver Function Tests:  Recent Labs Lab 03/11/15 1218  AST 163*  ALT 77*  ALKPHOS 82  BILITOT 0.5  PROT 7.4  ALBUMIN 4.1    Recent Labs Lab 03/11/15 1218  LIPASE 11   No results for input(s): AMMONIA in the last 168 hours. CBC:  Recent Labs Lab 03/11/15 1218  WBC 8.4  HGB 11.9*  HCT 35.9*  MCV 84.3  PLT 277   Cardiac Enzymes: No results for input(s): CKTOTAL, CKMB, CKMBINDEX, TROPONINI in the last 168  hours. BNP: Invalid input(s): POCBNP CBG:  Recent Labs Lab 03/11/15 1153 03/11/15 1613 03/11/15 1732  GLUCAP >600* 448* 432*    If 7PM-7AM, please contact night-coverage [email or web address] Password [email or web address] 03/11/2015, 5:54 PM      vb

## 2015-03-11 NOTE — ED Notes (Signed)
Made 2 attempts to draw and both were unsuccessful. RN notified

## 2015-03-11 NOTE — ED Provider Notes (Signed)
CSN: [id number]     Arrival date & time 03/11/15  1105 History   First MD Initiated Contact with Patient 03/11/15 1459     Chief Complaint  Patient presents with  . Emesis  . Diarrhea  . Hyperglycemia     (Consider location/radiation/quality/duration/timing/severity/associated sxs/prior Treatment) HPI Comments: Patient with PMH of DM1, insulin controlled, gastroparesis, presents to the ED with a chief complaint of hyperglycemia and abdominal pain.  She states that she is a very brittle diabetic.  She reports associated severe nausea, vomiting, and diarrhea.  States that all of her symptoms have dramatically worsened since 2 days ago.  She reports having a history of C. Diff, and states that he stools have not returned to normal since then (about a year).  She denies seeing any blood in her stool or vomit.  She states that she is unable to tolerate anything PO.  There are no aggravating or alleviating factors.  The history is provided by the patient. No language interpreter was used.    Past Medical History  Diagnosis Date  . DM type 1 causing complication ([place]) dx [id number]    hyperglycemia +/- DKA + coma, severe hypoglycemia, gastroparesis, peripheral neuropathy  . Thyroid disease     hypothyroidism associated with pregnancy  . Back pain   . PONV (postoperative nausea and vomiting)   . Anxiety     hx panic attacks.   Marland Kitchen GERD (gastroesophageal reflux disease)   . DM gastroparesis ([place])   . Anemia     .Autoimmune hemolytic anemia. bone marrow bx 2008, Dr Marin Olp  . Colitis, Clostridium difficile 05/2014  . Fatty liver 01/2007    with hepatomegaly on CT scan.    Past Surgical History  Procedure Laterality Date  . Laparoscopy  02/2002, 06/2010    laparoscopy with lysis pelvic adhesions for pelvic pain  . Back surgery      age 48  . Tooth extraction    . Root canal  10/10/12  . Flexible sigmoidoscopy N/A 06/24/2014    Procedure: FLEXIBLE SIGMOIDOSCOPY;  Surgeon: Milus Banister, MD;   Location: [facility];  Service: Endoscopy;  Laterality: N/A;  . Esophagogastroduodenoscopy (egd) with propofol N/A 06/27/2014    Procedure: ESOPHAGOGASTRODUODENOSCOPY (EGD) WITH PROPOFOL;  Surgeon: Milus Banister, MD;  Location: [facility];  Service: Endoscopy;  Laterality: N/A;   Family History  Problem Relation Age of Onset  . Diabetes Maternal Grandmother   . Cancer Maternal Grandmother     Leukemia  . Cancer Maternal Grandfather     Small cell lung cancer   Social History  Substance Use Topics  . Smoking status: Former Smoker    Quit date: 04/19/2000  . Smokeless tobacco: Former Systems developer  . Alcohol Use: No     Comment: RARE SIPS OF WINE   OB History    No data available     Review of Systems  Gastrointestinal: Positive for nausea, vomiting, abdominal pain and diarrhea.  Genitourinary: Positive for frequency.  All other systems reviewed and are negative.     Allergies  Scallops; Diphenhydramine hcl; Haloperidol and related; Ketorolac; Morphine and related; Toradol; Latex; and Other  Home Medications   Prior to Admission medications   Medication Sig Start Date End Date Taking? Authorizing Provider  acetaminophen (TYLENOL) 500 MG tablet Take 1,000 mg by mouth every 6 (six) hours as needed for moderate pain.    Yes Historical Provider, MD  ALPRAZolam Duanne Moron) 1 MG tablet Take 2 mg by mouth 2 (two)  times daily as needed for anxiety.    Yes Historical Provider, MD  amitriptyline (ELAVIL) 50 MG tablet Take 2 tablets (100 mg total) by mouth at bedtime. 08/07/14  Yes Arnoldo Morale, MD  CALCIUM PO Take 2 tablets by mouth daily.   Yes Historical Provider, MD  cholestyramine Lucrezia Starch) 4 G packet Take 1 packet (4 g total) by mouth 2 (two) times daily. Patient taking differently: Take 4 g by mouth 4 (four) times daily.  09/19/14  Yes Jeffrey Hedges, PA-C  CREON 36000 UNITS CPEP capsule Take 4-6 capsules by mouth. Takes 6 capsules with every meal and 4 capsules with every snack.  12/02/14  Yes Historical Provider, MD  cyanocobalamin 500 MCG tablet Take 2,000 mcg by mouth 2 (two) times daily as needed (mouth sores).    Yes Historical Provider, MD  dicyclomine (BENTYL) 10 MG capsule Take 10 mg by mouth 4 (four) times daily -  before meals and at bedtime.   Yes Historical Provider, MD  diphenoxylate-atropine (LOMOTIL) 2.5-0.025 MG per tablet Take 1 tablet by mouth 4 (four) times daily as needed for diarrhea or loose stools. 11/08/14  Yes Orpah Greek, MD  feeding supplement, GLUCERNA SHAKE, (GLUCERNA SHAKE) LIQD Take 237 mLs by mouth 3 (three) times daily between meals. Patient taking differently: Take 237 mLs by mouth 4 (four) times daily.  07/06/14  Yes Thurnell Lose, MD  gabapentin (NEURONTIN) 100 MG capsule Take 300 mg by mouth at bedtime.    Yes Historical Provider, MD  glucagon (GLUCAGON EMERGENCY) 1 MG injection Inject 1 mg into the muscle once as needed (severe hypoglycemia). 08/14/14  Yes Arnoldo Morale, MD  insulin aspart (NOVOLOG FLEXPEN) 100 UNIT/ML FlexPen Inject 0-5 Units into the skin 3 (three) times daily as needed for high blood sugar.    Yes Historical Provider, MD  insulin glargine (LANTUS) 100 UNIT/ML injection Inject 0.07-0.08 mLs (7-8 Units total) into the skin 2 (two) times daily. 8 units in the morning and 7 units at night 01/15/15  Yes Velvet Bathe, MD  metoCLOPramide (REGLAN) 10 MG tablet Take 1 tablet (10 mg total) by mouth every 6 (six) hours as needed for nausea or vomiting. 01/15/15  Yes Velvet Bathe, MD  Multiple Vitamin (MULTIVITAMIN WITH MINERALS) TABS Take 2 tablets by mouth daily. Diabetic support vitamin   Yes Historical Provider, MD  omeprazole (PRILOSEC) 40 MG capsule Take 40 mg by mouth daily as needed (indigestion).    Yes Historical Provider, MD  oxyCODONE-acetaminophen (PERCOCET) 10-325 MG tablet Take 0.5 tablets by mouth every 6 (six) hours as needed for pain.    Yes Historical Provider, MD  Potassium (POTASSIMIN PO) Take 2  capsules by mouth daily.   Yes Historical Provider, MD  Probiotic Product (DIGESTIVE ADVANTAGE GUMMIES PO) Take 2 tablets by mouth 2 (two) times daily.   Yes Historical Provider, MD  promethazine (PHENERGAN) 25 MG suppository Place 1 suppository (25 mg total) rectally every 6 (six) hours as needed for nausea or vomiting. 01/15/15  Yes Velvet Bathe, MD  promethazine (PHENERGAN) 25 MG tablet Take 25 mg by mouth every 6 (six) hours as needed for nausea or vomiting.   Yes Historical Provider, MD  saccharomyces boulardii (FLORASTOR) 250 MG capsule Take 1 capsule (250 mg total) by mouth 2 (two) times daily. 07/06/14  Yes Thurnell Lose, MD  cephALEXin (KEFLEX) 500 MG capsule Take 1 capsule (500 mg total) by mouth 2 (two) times daily. 02/11/15   Sharlett Iles, MD  Eluxadoline (VIBERZI) 100  MG TABS Take 100 mg by mouth 2 (two) times daily.    Historical Provider, MD  ondansetron (ZOFRAN ODT) 4 MG disintegrating tablet 4mg  ODT q4 hours prn nausea/vomit 11/28/14   Milton Ferguson, MD  promethazine (PHENERGAN) 12.5 MG tablet Take 1 tablet (12.5 mg total) by mouth every 6 (six) hours as needed for nausea or vomiting. 01/15/15   Velvet Bathe, MD  traMADol (ULTRAM) 50 MG tablet Take 1 tablet (50 mg total) by mouth every 6 (six) hours as needed. 01/10/15   Daleen Bo, MD   BP 101/75 mmHg  Pulse 87  Temp(Src) 98.1 F (36.7 C) (Oral)  Resp 19  SpO2 99% Physical Exam  Constitutional: She is oriented to person, place, and time. She appears well-developed and well-nourished.  HENT:  Head: Normocephalic and atraumatic.  Eyes: Conjunctivae and EOM are normal. Pupils are equal, round, and reactive to light.  Neck: Normal range of motion. Neck supple.  Cardiovascular: Regular rhythm.  Exam reveals no gallop and no friction rub.   No murmur heard. tachycardia  Pulmonary/Chest: Effort normal and breath sounds normal. No respiratory distress. She has no wheezes. She has no rales. She exhibits no tenderness.   Abdominal: Soft. Bowel sounds are normal. She exhibits no distension and no mass. There is tenderness. There is no rebound and no guarding.  Moderate diffuse abdominal tenderness  Musculoskeletal: Normal range of motion. She exhibits no edema or tenderness.  Neurological: She is alert and oriented to person, place, and time.  Skin: Skin is warm and dry.  Psychiatric: She has a normal mood and affect. Her behavior is normal. Judgment and thought content normal.  Nursing note and vitals reviewed.   ED Course  Procedures (including critical care time) Results for orders placed or performed during the hospital encounter of 03/11/15  C difficile quick scan w PCR reflex  Result Value Ref Range   C Diff antigen NEGATIVE NEGATIVE   C Diff toxin NEGATIVE NEGATIVE   C Diff interpretation Negative for toxigenic C. difficile   Lipase, blood  Result Value Ref Range   Lipase 11 11 - 51 U/L  Comprehensive metabolic panel  Result Value Ref Range   Sodium 131 (L) 135 - 145 mmol/L   Potassium 5.2 (H) 3.5 - 5.1 mmol/L   Chloride 91 (L) 101 - 111 mmol/L   CO2 30 22 - 32 mmol/L   Glucose, Bld 614 (HH) 65 - 99 mg/dL   BUN 10 6 - 20 mg/dL   Creatinine, Ser 0.69 0.44 - 1.00 mg/dL   Calcium 9.7 8.9 - 10.3 mg/dL   Total Protein 7.4 6.5 - 8.1 g/dL   Albumin 4.1 3.5 - 5.0 g/dL   AST 163 (H) 15 - 41 U/L   ALT 77 (H) 14 - 54 U/L   Alkaline Phosphatase 82 38 - 126 U/L   Total Bilirubin 0.5 0.3 - 1.2 mg/dL   GFR calc non Af Amer >60 >60 mL/min   GFR calc Af Amer >60 >60 mL/min   Anion gap 10 5 - 15  CBC  Result Value Ref Range   WBC 8.4 4.0 - 10.5 K/uL   RBC 4.26 3.87 - 5.11 MIL/uL   Hemoglobin 11.9 (L) 12.0 - 15.0 g/dL   HCT 35.9 (L) 36.0 - 46.0 %   MCV 84.3 78.0 - 100.0 fL   MCH 27.9 26.0 - 34.0 pg   MCHC 33.1 30.0 - 36.0 g/dL   RDW 12.7 11.5 - 15.5 %   Platelets 277  150 - 400 K/uL  Urinalysis, Routine w reflex microscopic (not at [facility])  Result Value Ref Range   Color, Urine YELLOW YELLOW    APPearance CLEAR CLEAR   Specific Gravity, Urine 1.035 (H) 1.005 - 1.030   pH 8.0 5.0 - 8.0   Glucose, UA >1000 (A) NEGATIVE mg/dL   Hgb urine dipstick NEGATIVE NEGATIVE   Bilirubin Urine NEGATIVE NEGATIVE   Ketones, ur NEGATIVE NEGATIVE mg/dL   Protein, ur NEGATIVE NEGATIVE mg/dL   Nitrite NEGATIVE NEGATIVE   Leukocytes, UA NEGATIVE NEGATIVE  Urine microscopic-add on  Result Value Ref Range   Squamous Epithelial / LPF 0-5 (A) NONE SEEN   WBC, UA 0-5 0 - 5 WBC/hpf   RBC / HPF NONE SEEN 0 - 5 RBC/hpf   Bacteria, UA RARE (A) NONE SEEN  POC urine preg, ED (not at [facility])  Result Value Ref Range   Preg Test, Ur NEGATIVE NEGATIVE  CBG monitoring, ED  Result Value Ref Range   Glucose-Capillary >600 (HH) 65 - 99 mg/dL  I-Stat CG4 Lactic Acid, ED  Result Value Ref Range   Lactic Acid, Venous 1.98 0.5 - 2.0 mmol/L  CBG monitoring, ED  Result Value Ref Range   Glucose-Capillary 448 (H) 65 - 99 mg/dL  CBG monitoring, ED  Result Value Ref Range   Glucose-Capillary 432 (H) 65 - 99 mg/dL  CBG monitoring, ED  Result Value Ref Range   Glucose-Capillary 223 (H) 65 - 99 mg/dL   Ct Abdomen Pelvis W Contrast  03/11/2015  CLINICAL DATA:  Vomiting, diarrhea, and hyperglycemia. EXAM: CT ABDOMEN AND PELVIS WITH CONTRAST TECHNIQUE: Multidetector CT imaging of the abdomen and pelvis was performed using the standard protocol following bolus administration of intravenous contrast. CONTRAST:  130mL OMNIPAQUE IOHEXOL 300 MG/ML  SOLN COMPARISON:  February 24, 2015 FINDINGS: The scout view is unremarkable. The lung bases are within normal limits. The free air seen in the abdomen on the comparison CT scan is not visualized today. No free air or free fluid. Low attenuation adjacent to the falciform ligament is likely focal fatty deposition of no significance. The liver and gallbladder are unremarkable. The portal vein is well opacified. Granulomas are seen in an otherwise normal-appearing spleen. The adrenal glands,  kidneys, and visualized portions of pancreas are normal. No aneurysm or adenopathy. The stomach is distended with fluid as is the proximal duodenum. The remainder of the small bowel is normal in appearance. Evaluation of colon is limited due to the lack of contrast in the colon. The colon is much improved in appearance in the interval. It is thin walled. The pneumatosis seen in the ascending colon on the previous study has almost completely resolved. A tiny focus of air in the wall of the ascending colon on axial image 42 and coronal image 31 remains. The abdominal aorta is normal in caliber. No other abnormalities are seen within the abdomen. The pelvis demonstrates no adenopathy or mass. The rectal wall thickening seen previously has resolved. The uterus and adnexa are free of obvious abnormality. The bladder is normal. Visualized bones are within normal limits. IMPRESSION: 1. Distention of the stomach and proximal duodenum, likely responsible for the patient's nausea and vomiting. 2. The colon is significantly improved in appearance in the interval. The previously seen pneumatosis has almost completely resolved. There is no colonic wall thickening or pericolonic stranding. Electronically Signed   By: Dorise Bullion III M.D   On: 03/11/2015 17:15    I have personally reviewed and  evaluated these images and lab results as part of my medical decision-making.   EKG Interpretation None      MDM   Final diagnoses:  Hyperglycemia  Nausea vomiting and diarrhea    Patient with type 1 DM.  Hyperglycemic to greater than 600.  Normal anion gap.  Doubt DKA, but patient has been unable to tolerate any fluids.  She also has gastroparesis and has been having worsening symptoms since 2 days ago.  She has moderate diffuse abdominal tenderness.  Will give fluids and start glucostabilizer. Will monitor closely as she has had her glucose plummet in the past.  4:24 PM Patient discussed with Dr. Johnney Killian, who  agrees with plan for admission.  Hyperglycemia, unable to tolerate POs.  Hyperglycemia CBG>600, requiring multiple fluid boluses and glucostabilizer CRITICAL CARE Performed by: Montine Circle   Total critical care time: 35 minutes  Critical care time was exclusive of separately billable procedures and treating other patients.  Critical care was necessary to treat or prevent imminent or life-threatening deterioration.  Critical care was time spent personally by me on the following activities: development of treatment plan with patient and/or surrogate as well as nursing, discussions with consultants, evaluation of patient's response to treatment, examination of patient, obtaining history from patient or surrogate, ordering and performing treatments and interventions, ordering and review of laboratory studies, ordering and review of radiographic studies, pulse oximetry and re-evaluation of patient's condition.     Montine Circle, PA-C 03/11/15 1849  Charlesetta Shanks, MD 03/11/15 1905

## 2015-03-12 ENCOUNTER — Encounter (HOSPITAL_COMMUNITY): Payer: Self-pay | Admitting: Emergency Medicine

## 2015-03-12 DIAGNOSIS — E440 Moderate protein-calorie malnutrition: Secondary | ICD-10-CM | POA: Insufficient documentation

## 2015-03-12 LAB — GLUCOSE, CAPILLARY
GLUCOSE-CAPILLARY: 362 mg/dL — AB (ref 65–99)
GLUCOSE-CAPILLARY: 259 mg/dL — AB (ref 65–99)
Glucose-Capillary: 359 mg/dL — ABNORMAL HIGH (ref 65–99)
Glucose-Capillary: 322 mg/dL — ABNORMAL HIGH (ref 65–99)

## 2015-03-12 LAB — BASIC METABOLIC PANEL
ANION GAP: 7 (ref 5–15)
ANION GAP: 8 (ref 5–15)
ANION GAP: 8 (ref 5–15)
Anion gap: 7 (ref 5–15)
BUN: 9 mg/dL (ref 6–20)
BUN: 10 mg/dL (ref 6–20)
BUN: 10 mg/dL (ref 6–20)
BUN: 9 mg/dL (ref 6–20)
CHLORIDE: 106 mmol/L (ref 101–111)
CHLORIDE: 106 mmol/L (ref 101–111)
CHLORIDE: 108 mmol/L (ref 101–111)
CHLORIDE: 110 mmol/L (ref 101–111)
CO2: 25 mmol/L (ref 22–32)
CO2: 26 mmol/L (ref 22–32)
CO2: 23 mmol/L (ref 22–32)
CO2: 26 mmol/L (ref 22–32)
Calcium: 8.5 mg/dL — ABNORMAL LOW (ref 8.9–10.3)
Calcium: 8.9 mg/dL (ref 8.9–10.3)
Calcium: 8.4 mg/dL — ABNORMAL LOW (ref 8.9–10.3)
Calcium: 8.8 mg/dL — ABNORMAL LOW (ref 8.9–10.3)
Creatinine, Ser: 0.63 mg/dL (ref 0.44–1.00)
Creatinine, Ser: 0.57 mg/dL (ref 0.44–1.00)
Creatinine, Ser: 0.55 mg/dL (ref 0.44–1.00)
Creatinine, Ser: 0.72 mg/dL (ref 0.44–1.00)
GFR calc non Af Amer: >60 mL/min (ref >60)
GFR calc non Af Amer: >60 mL/min (ref >60)
Glucose, Bld: 286 mg/dL — ABNORMAL HIGH (ref 65–99)
Glucose, Bld: 121 mg/dL — ABNORMAL HIGH (ref 65–99)
Glucose, Bld: 304 mg/dL — ABNORMAL HIGH (ref 65–99)
Glucose, Bld: 353 mg/dL — ABNORMAL HIGH (ref 65–99)
POTASSIUM: 3.5 mmol/L (ref 3.5–5.1)
POTASSIUM: 3.7 mmol/L (ref 3.5–5.1)
POTASSIUM: 3.8 mmol/L (ref 3.5–5.1)
POTASSIUM: 4 mmol/L (ref 3.5–5.1)
SODIUM: 136 mmol/L (ref 135–145)
SODIUM: 139 mmol/L (ref 135–145)
SODIUM: 141 mmol/L (ref 135–145)
SODIUM: 144 mmol/L (ref 135–145)

## 2015-03-12 LAB — RAPID URINE DRUG SCREEN, HOSP PERFORMED
AMPHETAMINES: NOT DETECTED
Barbiturates: NOT DETECTED
Benzodiazepines: NOT DETECTED
COCAINE: NOT DETECTED
OPIATES: POSITIVE — AB
TETRAHYDROCANNABINOL: NOT DETECTED

## 2015-03-12 LAB — CBG MONITORING, ED
GLUCOSE-CAPILLARY: 130 mg/dL — AB (ref 65–99)
GLUCOSE-CAPILLARY: 120 mg/dL — AB (ref 65–99)
Glucose-Capillary: 171 mg/dL — ABNORMAL HIGH (ref 65–99)
Glucose-Capillary: 254 mg/dL — ABNORMAL HIGH (ref 65–99)
Glucose-Capillary: 288 mg/dL — ABNORMAL HIGH (ref 65–99)

## 2015-03-12 MED ORDER — INSULIN ASPART 100 UNIT/ML ~~LOC~~ SOLN: 0.0000 [IU] | Freq: Three times a day (TID) | SUBCUTANEOUS | Status: DC

## 2015-03-12 MED ORDER — INSULIN ASPART 100 UNIT/ML ~~LOC~~ SOLN: 0.0000 [IU] | Freq: Every day | SUBCUTANEOUS | Status: DC

## 2015-03-12 MED ORDER — GLUCERNA SHAKE PO LIQD
237.0000 mL | Freq: Three times a day (TID) | ORAL | Status: DC
  Administered 2015-03-12: 237 mL via ORAL
  Filled 2015-03-12 (×2): qty 237

## 2015-03-12 MED ORDER — ACETAMINOPHEN 500 MG PO TABS
1000.0000 mg | ORAL_TABLET | Freq: Four times a day (QID) | ORAL | Status: DC | PRN
  Administered 2015-03-12: 1000 mg via ORAL
  Filled 2015-03-12: qty 2

## 2015-03-12 MED ORDER — PROMETHAZINE HCL 12.5 MG PO TABS
12.5000 mg | ORAL_TABLET | Freq: Four times a day (QID) | ORAL | Status: DC | PRN
Start: 2015-03-12 — End: 2015-03-27

## 2015-03-12 NOTE — ED Notes (Signed)
Attempted to call report. They will call us back.

## 2015-03-12 NOTE — Discharge Summary (Signed)
Physician Discharge Summary  [name] [id number] DOB: 11-25-1980 DOA: 03/11/2015  PCP: [name], MD  Admit date: 03/11/2015 Discharge date: 03/12/2015  Time spent: > 35 minutes  Recommendations for Outpatient Follow-up:  1. Patient is to follow-up with primary care physician for further evaluation recommendations. Particularly related to blood sugars   Discharge Diagnoses:  Principal Problem:   Hyperglycemic hyperosmolar nonketotic coma ([place]) Active Problems:   Diabetic gastroparesis associated with type 1 diabetes mellitus (HCC)   Protein-calorie malnutrition, severe (HCC)   Personal history of noncompliance with medical treatment   Adjustment disorder with mixed anxiety and depressed mood   Exocrine pancreatic insufficiency (HCC)   Hyperkalemia   Uncontrolled diabetes mellitus with diabetic neuropathy, with long-term current use of insulin (HCC)   Diarrhea due to malabsorption   Hyperglycemia   Malnutrition of moderate degree   Discharge Condition: Stable  Diet recommendation: Carb modified diet  Filed Weights   03/12/15 0627  Weight: 44.09 kg (97 lb 3.2 oz)    History of present illness:  From original HPI: 35 year old female with past medical history of diabetes mellitus type 1 on insulin, related gastroparesis, noncompliance with insulin who presented to [facility] long hospital with reports of abdominal discomfort, nausea, vomiting and diarrhea for past 2 days prior to this admission. No reports of fevers or chills  Hospital Course:  Hyperglycemic hyperosmolar nonketotic coma - Was successfully transitioned off of insulin drip  - Patient refused sliding scale insulin and on day of discharge had higher blood sugars.  - She feels better but still has some nausea. She reports that at baseline she has nausea frequently   Diabetic gastroparesis associated with type 1 diabetes mellitus - Have educated patient about opioid medications causing worsening in her  abdominal discomfort  Protein calorie malnutrition - Dietitian on board while patient was in house  Adjustment disorder with mixed anxiety and depressed mood -Continue home medication regimen  Diarrhea - C. difficile negative, patient to continue regimen recommended by her outpatient gastroenterologist  Procedures:   none  Consultations:  None  Discharge Exam: Filed Vitals:   03/12/15 0708 03/12/15 0855  BP: 87/54 91/53  Pulse: 89   Temp: 98.1 F (36.7 C)   Resp: 20     General: Pt in nad, alert and awake Cardiovascular: rrr, no mrg Respiratory: cta bl, no wheezes  Discharge Instructions   Discharge Instructions    Call MD for:  severe uncontrolled pain    Complete by:  As directed      Call MD for:  temperature >100.4    Complete by:  As directed      Diet - low sodium heart healthy    Complete by:  As directed      Discharge instructions    Complete by:  As directed   Please be sure to follow up with your primary care physician in 1-2 weeks or sooner should any new concerns arise.     Increase activity slowly    Complete by:  As directed           Current Discharge Medication List    CONTINUE these medications which have CHANGED   Details  !! promethazine (PHENERGAN) 12.5 MG tablet Take 1 tablet (12.5 mg total) by mouth every 6 (six) hours as needed for nausea or vomiting. Qty: 30 tablet, Refills: 0     !! - Potential duplicate medications found. Please discuss with provider.    CONTINUE these medications which have NOT CHANGED  Details  acetaminophen (TYLENOL) 500 MG tablet Take 1,000 mg by mouth every 6 (six) hours as needed for moderate pain.     ALPRAZolam (XANAX) 1 MG tablet Take 2 mg by mouth 2 (two) times daily as needed for anxiety.     amitriptyline (ELAVIL) 50 MG tablet Take 2 tablets (100 mg total) by mouth at bedtime. Qty: 60 tablet, Refills: 0    CALCIUM PO Take 2 tablets by mouth daily.    cholestyramine (QUESTRAN) 4 G packet  Take 1 packet (4 g total) by mouth 2 (two) times daily. Qty: 60 each, Refills: 12    CREON 36000 UNITS CPEP capsule Take 4-6 capsules by mouth. Takes 6 capsules with every meal and 4 capsules with every snack. Refills: 1    cyanocobalamin 500 MCG tablet Take 2,000 mcg by mouth 2 (two) times daily as needed (mouth sores).     dicyclomine (BENTYL) 10 MG capsule Take 10 mg by mouth 4 (four) times daily -  before meals and at bedtime.    diphenoxylate-atropine (LOMOTIL) 2.5-0.025 MG per tablet Take 1 tablet by mouth 4 (four) times daily as needed for diarrhea or loose stools. Qty: 30 tablet, Refills: 0    feeding supplement, GLUCERNA SHAKE, (GLUCERNA SHAKE) LIQD Take 237 mLs by mouth 3 (three) times daily between meals. Qty: 90 Can, Refills: 0    gabapentin (NEURONTIN) 100 MG capsule Take 300 mg by mouth at bedtime.     glucagon (GLUCAGON EMERGENCY) 1 MG injection Inject 1 mg into the muscle once as needed (severe hypoglycemia). Qty: 2 each, Refills: prn    insulin aspart (NOVOLOG FLEXPEN) 100 UNIT/ML FlexPen Inject 0-5 Units into the skin 3 (three) times daily as needed for high blood sugar.     insulin glargine (LANTUS) 100 UNIT/ML injection Inject 0.07-0.08 mLs (7-8 Units total) into the skin 2 (two) times daily. 8 units in the morning and 7 units at night Qty: 10 mL, Refills: 3    metoCLOPramide (REGLAN) 10 MG tablet Take 1 tablet (10 mg total) by mouth every 6 (six) hours as needed for nausea or vomiting. Qty: 30 tablet, Refills: 0    Multiple Vitamin (MULTIVITAMIN WITH MINERALS) TABS Take 2 tablets by mouth daily. Diabetic support vitamin    omeprazole (PRILOSEC) 40 MG capsule Take 40 mg by mouth daily as needed (indigestion).     oxyCODONE-acetaminophen (PERCOCET) 10-325 MG tablet Take 0.5 tablets by mouth every 6 (six) hours as needed for pain.     Potassium (POTASSIMIN PO) Take 2 capsules by mouth daily.    Probiotic Product (DIGESTIVE ADVANTAGE GUMMIES PO) Take 2 tablets by  mouth 2 (two) times daily.    promethazine (PHENERGAN) 25 MG suppository Place 1 suppository (25 mg total) rectally every 6 (six) hours as needed for nausea or vomiting. Qty: 12 suppository, Refills: 0    !! promethazine (PHENERGAN) 25 MG tablet Take 25 mg by mouth every 6 (six) hours as needed for nausea or vomiting.    saccharomyces boulardii (FLORASTOR) 250 MG capsule Take 1 capsule (250 mg total) by mouth 2 (two) times daily. Qty: 60 capsule, Refills: 2    Eluxadoline (VIBERZI) 100 MG TABS Take 100 mg by mouth 2 (two) times daily.    ondansetron (ZOFRAN ODT) 4 MG disintegrating tablet 4mg  ODT q4 hours prn nausea/vomit Qty: 15 tablet, Refills: 0    traMADol (ULTRAM) 50 MG tablet Take 1 tablet (50 mg total) by mouth every 6 (six) hours as needed. Qty: 30 tablet,  Refills: 0     !! - Potential duplicate medications found. Please discuss with provider.    STOP taking these medications     cephALEXin (KEFLEX) 500 MG capsule        Allergies  Allergen Reactions  . Scallops [Shellfish Allergy] Anaphylaxis  . Diphenhydramine Hcl Nausea Only    IV diphenhydramine.  Oral capsules ok.    . Haloperidol And Related Itching    Pt wanted to rip skin off  . Ketorolac Itching  . Morphine And Related Itching  . Toradol [Ketorolac Tromethamine]     Pt wanted to rip skin off  . Latex Rash  . Other Rash    Sunscreen and grass      The results of significant diagnostics from this hospitalization (including imaging, microbiology, ancillary and laboratory) are listed below for reference.    Significant Diagnostic Studies: Ct Abdomen Pelvis W Contrast  03/11/2015  CLINICAL DATA:  Vomiting, diarrhea, and hyperglycemia. EXAM: CT ABDOMEN AND PELVIS WITH CONTRAST TECHNIQUE: Multidetector CT imaging of the abdomen and pelvis was performed using the standard protocol following bolus administration of intravenous contrast. CONTRAST:  118mL OMNIPAQUE IOHEXOL 300 MG/ML  SOLN COMPARISON:  February 24, 2015 FINDINGS: The scout view is unremarkable. The lung bases are within normal limits. The free air seen in the abdomen on the comparison CT scan is not visualized today. No free air or free fluid. Low attenuation adjacent to the falciform ligament is likely focal fatty deposition of no significance. The liver and gallbladder are unremarkable. The portal vein is well opacified. Granulomas are seen in an otherwise normal-appearing spleen. The adrenal glands, kidneys, and visualized portions of pancreas are normal. No aneurysm or adenopathy. The stomach is distended with fluid as is the proximal duodenum. The remainder of the small bowel is normal in appearance. Evaluation of colon is limited due to the lack of contrast in the colon. The colon is much improved in appearance in the interval. It is thin walled. The pneumatosis seen in the ascending colon on the previous study has almost completely resolved. A tiny focus of air in the wall of the ascending colon on axial image 42 and coronal image 31 remains. The abdominal aorta is normal in caliber. No other abnormalities are seen within the abdomen. The pelvis demonstrates no adenopathy or mass. The rectal wall thickening seen previously has resolved. The uterus and adnexa are free of obvious abnormality. The bladder is normal. Visualized bones are within normal limits. IMPRESSION: 1. Distention of the stomach and proximal duodenum, likely responsible for the patient's nausea and vomiting. 2. The colon is significantly improved in appearance in the interval. The previously seen pneumatosis has almost completely resolved. There is no colonic wall thickening or pericolonic stranding. Electronically Signed   By: Dorise Bullion III M.D   On: 03/11/2015 17:15    Microbiology: Recent Results (from the past 240 hour(s))  C difficile quick scan w PCR reflex     Status: None   Collection Time: 03/11/15  4:15 PM  Result Value Ref Range Status   C Diff antigen NEGATIVE  NEGATIVE Final   C Diff toxin NEGATIVE NEGATIVE Final   C Diff interpretation Negative for toxigenic C. difficile  Final     Labs: Basic Metabolic Panel:  Recent Labs Lab 03/11/15 1218 03/12/15 0036 03/12/15 0300 03/12/15 0715 03/12/15 1051  NA 131* 141 144 136 139  K 5.2* 3.7 3.5 3.8 4.0  CL 91* 108 110 106 106  CO2  30 25 26 23 26   GLUCOSE 614* 286* 121* 304* 353*  BUN 10 9 9 10 10   CREATININE 0.69 0.63 0.57 0.55 0.72  CALCIUM 9.7 8.5* 8.9 8.4* 8.8*   Liver Function Tests:  Recent Labs Lab 03/11/15 1218  AST 163*  ALT 77*  ALKPHOS 82  BILITOT 0.5  PROT 7.4  ALBUMIN 4.1    Recent Labs Lab 03/11/15 1218  LIPASE 11   No results for input(s): AMMONIA in the last 168 hours. CBC:  Recent Labs Lab 03/11/15 1218  WBC 8.4  HGB 11.9*  HCT 35.9*  MCV 84.3  PLT 277   Cardiac Enzymes: No results for input(s): CKTOTAL, CKMB, CKMBINDEX, TROPONINI in the last 168 hours. BNP: BNP (last 3 results) No results for input(s): BNP in the last 8760 hours.  ProBNP (last 3 results) No results for input(s): PROBNP in the last 8760 hours.  CBG:  Recent Labs Lab 03/12/15 0305 03/12/15 0413 03/12/15 0739 03/12/15 0923 03/12/15 1223  GLUCAP 171* 120* 359* 362* 259*    Signed:  Velvet Bathe MD.  [facility] 03/12/2015, 5:08 PM

## 2015-03-12 NOTE — Progress Notes (Signed)
Pt states she needs sensitive SSI. Pt refused SSI this am. MD notified.

## 2015-03-12 NOTE — ED Notes (Signed)
CBG of 130 was collected near the IVF infusing. Rechecked away from infusing fluids.

## 2015-03-12 NOTE — Progress Notes (Signed)
Pt VSS. CBG managed. IV removed. Tele d/c'd. Discharge summary reviewed at bedside w/ family members present. Prescriptions provided to pt. No further questions.

## 2015-03-12 NOTE — Progress Notes (Signed)
Initial Nutrition Assessment  DOCUMENTATION CODES:   Underweight  INTERVENTION:  -Begin Glucerna TID, 220 kcal, 10 g protein per bottle.  -RD will continue to monitor for nutritional needs    NUTRITION DIAGNOSIS:   Impaired nutrient utilization related to nausea, vomiting, chronic illness, other (see comment) (diarrhea ) as evidenced by per patient/family report.  GOAL:   Patient will meet greater than or equal to 90% of their needs  MONITOR:   PO intake, Supplement acceptance, Labs, Weight trends, I & O's  REASON FOR ASSESSMENT:   Malnutrition Screening Tool  ASSESSMENT:   Pt PMH of type 1 DM on insulin, related gastroparesis, noncompliance with insulin who presented to [facility] with reports of abdominal discomfort, nausea, vomiting and diarrhea for past 2 days prior to this admission. No reports of blood in stool. No reports of chest pain, shortness of breath or palpitations. No reports of dysuria or hematuria. No blurry vision.   Pt seen for MST. Pt reports no issues with her appetite, she states that she always feels hungry. Pt reports that since her C. Diff infection a year ago she has had continual diarrhea and has lost weight. Pt reports that her UBW piror to the C. Diff was within 130 lb. Weight is has declined by 26% (33 lb) within a year, which is significant for time frame.   Pt reports that some foods are better tolerated then others, such as peanut butter. She reports that her food tolerances fluctuate over time. Pt reports that she is a brittle diabetic. Pt stated that she takes probiotics and drinks Glucerna 3-6x a day. Pt reports that she does consume muscle milk.   Pt stated that she has talk to medical personal about nutrition support. Per pt she states that many agree that it may be beneficial for her but they are unwilling due to risk of infection.   Conducted nutrition focused physical exam. Found mild/moderate fat and muscle wasting. Reviewed  medications. Reviewed lab; CBG 120-432 mg/dL.  Diet Order:  Diet Carb Modified Fluid consistency:: Thin; Room service appropriate?: Yes  Skin:  Reviewed, no issues  Last BM:  1/18  Height:   Ht Readings from Last 1 Encounters:  03/12/15 5\' 5"  (1.651 m)    Weight:   Wt Readings from Last 1 Encounters:  03/12/15 97 lb 3.2 oz (44.09 kg)    Ideal Body Weight:  57 kg (kg)  BMI:  Body mass index is 16.18 kg/(m^2).  Estimated Nutritional Needs:   Kcal:  1550-1750 (35-40 kcal/kg)  Protein:  75-85 grams  Fluid:  >/= 2 L  EDUCATION NEEDS:   No education needs identified at this time  [place], Dietetic Intern Pager: [phone]

## 2015-03-13 LAB — HEMOGLOBIN A1C
HEMOGLOBIN A1C: 13.2 % — AB (ref 4.8–5.6)
Mean Plasma Glucose: 332 mg/dL

## 2015-03-17 ENCOUNTER — Other Ambulatory Visit (HOSPITAL_COMMUNITY)
Admission: RE | Admit: 2015-03-17 | Discharge: 2015-03-17 | Disposition: A | Discharge status: Home / Self Care | Payer: Medicaid Other | Source: Ambulatory Visit | Attending: Internal Medicine | Admitting: Internal Medicine

## 2015-03-17 DIAGNOSIS — F1920 Other psychoactive substance dependence, uncomplicated: Secondary | ICD-10-CM | POA: Insufficient documentation

## 2015-03-17 DIAGNOSIS — Z79899 Other long term (current) drug therapy: Secondary | ICD-10-CM | POA: Insufficient documentation

## 2015-03-22 LAB — MISCELLANEOUS TEST: Miscellaneous Test: 66071

## 2015-03-22 LAB — ALCOHOL METABOLITE (ETG), URINE: Ethyl Glucuronide (EtG): NEGATIVE ng/mL

## 2015-03-27 ENCOUNTER — Encounter (HOSPITAL_COMMUNITY): Payer: Self-pay | Admitting: Emergency Medicine

## 2015-03-27 ENCOUNTER — Emergency Department (HOSPITAL_COMMUNITY)
Admission: EM | Admit: 2015-03-27 | Discharge: 2015-03-27 | Disposition: A | Discharge status: Home / Self Care | Payer: Medicaid Other | Attending: Emergency Medicine | Admitting: Emergency Medicine

## 2015-03-27 DIAGNOSIS — R197 Diarrhea, unspecified: Secondary | ICD-10-CM | POA: Diagnosis not present

## 2015-03-27 DIAGNOSIS — R7401 Elevation of levels of liver transaminase levels: Secondary | ICD-10-CM

## 2015-03-27 DIAGNOSIS — Z79899 Other long term (current) drug therapy: Secondary | ICD-10-CM | POA: Insufficient documentation

## 2015-03-27 DIAGNOSIS — Z87891 Personal history of nicotine dependence: Secondary | ICD-10-CM | POA: Insufficient documentation

## 2015-03-27 DIAGNOSIS — Z91040 Latex allergy status: Secondary | ICD-10-CM | POA: Insufficient documentation

## 2015-03-27 DIAGNOSIS — R1084 Generalized abdominal pain: Secondary | ICD-10-CM | POA: Insufficient documentation

## 2015-03-27 DIAGNOSIS — Z794 Long term (current) use of insulin: Secondary | ICD-10-CM | POA: Diagnosis not present

## 2015-03-27 DIAGNOSIS — R740 Nonspecific elevation of levels of transaminase and lactic acid dehydrogenase [LDH]: Secondary | ICD-10-CM | POA: Diagnosis not present

## 2015-03-27 DIAGNOSIS — E1065 Type 1 diabetes mellitus with hyperglycemia: Secondary | ICD-10-CM | POA: Diagnosis not present

## 2015-03-27 DIAGNOSIS — Z3202 Encounter for pregnancy test, result negative: Secondary | ICD-10-CM | POA: Insufficient documentation

## 2015-03-27 DIAGNOSIS — R739 Hyperglycemia, unspecified: Secondary | ICD-10-CM

## 2015-03-27 DIAGNOSIS — R112 Nausea with vomiting, unspecified: Secondary | ICD-10-CM | POA: Insufficient documentation

## 2015-03-27 LAB — CBC
HEMATOCRIT: 38.6 % (ref 36.0–46.0)
HEMOGLOBIN: 12.7 g/dL (ref 12.0–15.0)
MCH: 28.2 pg (ref 26.0–34.0)
MCHC: 32.9 g/dL (ref 30.0–36.0)
MCV: 85.6 fL (ref 78.0–100.0)
Platelets: 208 10*3/uL (ref 150–400)
RBC: 4.51 MIL/uL (ref 3.87–5.11)
RDW: 12.7 % (ref 11.5–15.5)
WBC: 5.3 10*3/uL (ref 4.0–10.5)

## 2015-03-27 LAB — URINE MICROSCOPIC-ADD ON
RBC / HPF: NONE SEEN RBC/hpf (ref 0–5)
WBC UA: NONE SEEN WBC/hpf (ref 0–5)

## 2015-03-27 LAB — CBG MONITORING, ED: GLUCOSE-CAPILLARY: 400 mg/dL — AB (ref 65–99)

## 2015-03-27 LAB — COMPREHENSIVE METABOLIC PANEL
ALBUMIN: 4.7 g/dL (ref 3.5–5.0)
ALK PHOS: 92 U/L (ref 38–126)
ALT: 101 U/L — ABNORMAL HIGH (ref 14–54)
ANION GAP: 12 (ref 5–15)
AST: 96 U/L — ABNORMAL HIGH (ref 15–41)
BUN: 10 mg/dL (ref 6–20)
CALCIUM: 9.8 mg/dL (ref 8.9–10.3)
CHLORIDE: 91 mmol/L — AB (ref 101–111)
CO2: 27 mmol/L (ref 22–32)
Creatinine, Ser: 0.84 mg/dL (ref 0.44–1.00)
GFR calc non Af Amer: >60 mL/min (ref >60)
GLUCOSE: 552 mg/dL — AB (ref 65–99)
Potassium: 5 mmol/L (ref 3.5–5.1)
SODIUM: 130 mmol/L — AB (ref 135–145)
Total Bilirubin: 0.8 mg/dL (ref 0.3–1.2)
Total Protein: 8 g/dL (ref 6.5–8.1)

## 2015-03-27 LAB — C DIFFICILE QUICK SCREEN W PCR REFLEX
C DIFFICILE (CDIFF) TOXIN: NEGATIVE
C DIFFICLE (CDIFF) ANTIGEN: NEGATIVE
C Diff interpretation: NEGATIVE

## 2015-03-27 LAB — URINALYSIS, ROUTINE W REFLEX MICROSCOPIC
BILIRUBIN URINE: NEGATIVE
HGB URINE DIPSTICK: NEGATIVE
Ketones, ur: NEGATIVE mg/dL
Leukocytes, UA: NEGATIVE
Nitrite: NEGATIVE
PH: 7 (ref 5.0–8.0)
Protein, ur: NEGATIVE mg/dL
SPECIFIC GRAVITY, URINE: 1.037 — AB (ref 1.005–1.030)

## 2015-03-27 LAB — PREGNANCY, URINE: Preg Test, Ur: NEGATIVE

## 2015-03-27 LAB — RAPID URINE DRUG SCREEN, HOSP PERFORMED
AMPHETAMINES: NOT DETECTED
BARBITURATES: NOT DETECTED
Benzodiazepines: NOT DETECTED
Cocaine: NOT DETECTED
OPIATES: NOT DETECTED
TETRAHYDROCANNABINOL: NOT DETECTED

## 2015-03-27 LAB — LIPASE, BLOOD: LIPASE: 17 U/L (ref 11–51)

## 2015-03-27 MED ORDER — DICYCLOMINE HCL 10 MG PO CAPS
10.0000 mg | ORAL_CAPSULE | Freq: Once | ORAL | Status: DC
  Filled 2015-03-27: qty 1

## 2015-03-27 MED ORDER — PROMETHAZINE HCL 25 MG RE SUPP: 25.0000 mg | Freq: Four times a day (QID) | RECTAL | Status: DC | PRN

## 2015-03-27 MED ORDER — FAMOTIDINE IN NACL 20-0.9 MG/50ML-% IV SOLN
20.0000 mg | Freq: Once | INTRAVENOUS | Status: AC
  Administered 2015-03-27: 20 mg via INTRAVENOUS
  Filled 2015-03-27: qty 50

## 2015-03-27 MED ORDER — METOCLOPRAMIDE HCL 5 MG/ML IJ SOLN
10.0000 mg | Freq: Once | INTRAMUSCULAR | Status: DC
  Filled 2015-03-27: qty 2

## 2015-03-27 MED ORDER — SODIUM CHLORIDE 0.9 % IV BOLUS (SEPSIS)
1000.0000 mL | Freq: Once | INTRAVENOUS | Status: AC
  Administered 2015-03-27: 1000 mL via INTRAVENOUS

## 2015-03-27 MED ORDER — PROMETHAZINE HCL 25 MG/ML IJ SOLN
25.0000 mg | Freq: Once | INTRAMUSCULAR | Status: AC
  Administered 2015-03-27: 25 mg via INTRAVENOUS
  Filled 2015-03-27: qty 1

## 2015-03-27 MED ORDER — PROMETHAZINE HCL 25 MG PO TABS: 25.0000 mg | ORAL_TABLET | Freq: Four times a day (QID) | ORAL | Status: DC | PRN

## 2015-03-27 MED ORDER — HYDROCODONE-ACETAMINOPHEN 5-325 MG PO TABS
1.0000 | ORAL_TABLET | Freq: Once | ORAL | Status: AC
  Administered 2015-03-27: 1 via ORAL
  Filled 2015-03-27: qty 1

## 2015-03-27 NOTE — Progress Notes (Signed)
Patient noted to have been seen in the ED 15 times with 2 admissions within the last six months.  Patient was admitted and discharged from hospital from 01/17 to 01/18.  At that time, patient admitted with abdominal pain, n/v and diarrhea.  Per, chart review, patient has a history of noncompliance with insulin.  This EDCM spoke to patient previous admission, patient reported taking her insulin as prescribed.  Patient has Medicaid insurance, pcp Dr. Sheryle Hail, endocrinologist at [facility].

## 2015-03-27 NOTE — ED Notes (Signed)
EDPA notified that the patient refused Bentyl and REglan. No new orders.

## 2015-03-27 NOTE — ED Notes (Signed)
Attempted blood draw with no success.     

## 2015-03-27 NOTE — ED Notes (Signed)
Patient and family requested to speak with provider.  [name] EDPA made aware and states will go and talk with patient and family.

## 2015-03-27 NOTE — Discharge Instructions (Signed)
Please follow with your primary care doctor in the next 2 days for a check-up. They must obtain records for further management.  ° °Do not hesitate to return to the Emergency Department for any new, worsening or concerning symptoms.  ° °

## 2015-03-27 NOTE — ED Notes (Signed)
Attempts x 3 to start IV. An order to IV Team placed.

## 2015-03-27 NOTE — ED Notes (Signed)
Patient presents with emesis, abdominal pain, diarrhea x3 days. Brittle diabetic. 20+ episodes of diarrhea, chronic diarrhea. Rates pain 9/10. Patient reports last cbg was 194.

## 2015-03-27 NOTE — ED Provider Notes (Signed)
CSN: [id number]     Arrival date & time 03/27/15  1623 History   First MD Initiated Contact with Patient 03/27/15 1703     Chief Complaint  Patient presents with  . Emesis  . Abdominal Pain     (Consider location/radiation/quality/duration/timing/severity/associated sxs/prior Treatment) HPI   Blood pressure 107/75, pulse 117, temperature 97.9 F (36.6 C), temperature source Oral, resp. rate 20, SpO2 100 %.  [name] is a 35 y.o. female with past medical history significant for "brittle" T1 diabetes complaining of multiple episodes of nonbloody, nonbilious, no coffee-ground emesis with associated severe diffuse abdominal pain (patient can't remember which came first vomiting or the pain) associated with multiple episodes of watery stool onset 3 days ago and lightheaded sensation when she stands up. She has a positive sick contact in that her daughter is sick with a GI bug. She denies fever, chills, change in urination, abnormal vaginal discharge, melena, hematochezia. States that she has diarrhea at her baseline. Blood sugars have been in the upper 100s at home. She rates her abdominal pain at 10 out of 10, no exacerbating or alleviating factors identified. States she has chronic diarrhea at her baseline and she sees a  gastroenterologist for this.  History reviewed. No pertinent past medical history. Past Surgical History  Procedure Laterality Date  . Laparoscopy  02/2002, 06/2010    laparoscopy with lysis pelvic adhesions for pelvic pain  . Back surgery      age 64  . Tooth extraction    . Root canal  10/10/12  . Flexible sigmoidoscopy N/A 06/24/2014    Procedure: FLEXIBLE SIGMOIDOSCOPY;  Surgeon: Milus Banister, MD;  Location: [facility];  Service: Endoscopy;  Laterality: N/A;  . Esophagogastroduodenoscopy (egd) with propofol N/A 06/27/2014    Procedure: ESOPHAGOGASTRODUODENOSCOPY (EGD) WITH PROPOFOL;  Surgeon: Milus Banister, MD;  Location: [facility];  Service: Endoscopy;   Laterality: N/A;   Family History  Problem Relation Age of Onset  . Diabetes Maternal Grandmother   . Cancer Maternal Grandmother     Leukemia  . Cancer Maternal Grandfather     Small cell lung cancer   Social History  Substance Use Topics  . Smoking status: Former Smoker    Quit date: 04/19/2000  . Smokeless tobacco: Former Systems developer  . Alcohol Use: No     Comment: RARE SIPS OF WINE   OB History    No data available     Review of Systems  10 systems reviewed and found to be negative, except as noted in the HPI.   Allergies  Scallops; Diphenhydramine hcl; Haloperidol and related; Ketorolac; Morphine and related; Toradol; Latex; and Other  Home Medications   Prior to Admission medications   Medication Sig Start Date End Date Taking? Authorizing Provider  acetaminophen (TYLENOL) 500 MG tablet Take 1,000 mg by mouth every 6 (six) hours as needed for moderate pain.    Yes Historical Provider, MD  ALPRAZolam Duanne Moron) 1 MG tablet Take 2 mg by mouth 2 (two) times daily as needed for anxiety.    Yes Historical Provider, MD  amitriptyline (ELAVIL) 50 MG tablet Take 2 tablets (100 mg total) by mouth at bedtime. 08/07/14  Yes Arnoldo Morale, MD  CALCIUM PO Take 2 tablets by mouth daily.   Yes Historical Provider, MD  cholestyramine Lucrezia Starch) 4 G packet Take 1 packet (4 g total) by mouth 2 (two) times daily. Patient taking differently: Take 4 g by mouth 4 (four) times daily.  09/19/14  Yes  Jeffrey Hedges, PA-C  CREON 36000 UNITS CPEP capsule Take 4-6 capsules by mouth. Takes 6 capsules with every meal and 4 capsules with every snack. 12/02/14  Yes Historical Provider, MD  cyanocobalamin 500 MCG tablet Take 2,000 mcg by mouth 2 (two) times daily as needed (mouth sores).    Yes Historical Provider, MD  dicyclomine (BENTYL) 10 MG capsule Take 10 mg by mouth 4 (four) times daily -  before meals and at bedtime.   Yes Historical Provider, MD  diphenoxylate-atropine (LOMOTIL) 2.5-0.025 MG per tablet  Take 1 tablet by mouth 4 (four) times daily as needed for diarrhea or loose stools. 11/08/14  Yes Orpah Greek, MD  Eluxadoline (VIBERZI) 100 MG TABS Take 100 mg by mouth 2 (two) times daily.   Yes Historical Provider, MD  feeding supplement, GLUCERNA SHAKE, (GLUCERNA SHAKE) LIQD Take 237 mLs by mouth 3 (three) times daily between meals. Patient taking differently: Take 237 mLs by mouth 4 (four) times daily.  07/06/14  Yes Thurnell Lose, MD  gabapentin (NEURONTIN) 100 MG capsule Take 300 mg by mouth at bedtime.    Yes Historical Provider, MD  insulin aspart (NOVOLOG FLEXPEN) 100 UNIT/ML FlexPen Inject 0-5 Units into the skin 3 (three) times daily as needed for high blood sugar.    Yes Historical Provider, MD  insulin glargine (LANTUS) 100 UNIT/ML injection Inject 0.07-0.08 mLs (7-8 Units total) into the skin 2 (two) times daily. 8 units in the morning and 7 units at night 01/15/15  Yes Velvet Bathe, MD  metoCLOPramide (REGLAN) 10 MG tablet Take 1 tablet (10 mg total) by mouth every 6 (six) hours as needed for nausea or vomiting. 01/15/15  Yes Velvet Bathe, MD  Multiple Vitamin (MULTIVITAMIN WITH MINERALS) TABS Take 2 tablets by mouth daily. Diabetic support vitamin   Yes Historical Provider, MD  omeprazole (PRILOSEC) 40 MG capsule Take 40 mg by mouth daily as needed (indigestion).    Yes Historical Provider, MD  oxyCODONE-acetaminophen (PERCOCET) 10-325 MG tablet Take 0.5 tablets by mouth every 6 (six) hours as needed for pain.    Yes Historical Provider, MD  Potassium (POTASSIMIN PO) Take 2 capsules by mouth daily.   Yes Historical Provider, MD  Probiotic Product (DIGESTIVE ADVANTAGE GUMMIES PO) Take 2 tablets by mouth 2 (two) times daily.   Yes Historical Provider, MD  saccharomyces boulardii (FLORASTOR) 250 MG capsule Take 1 capsule (250 mg total) by mouth 2 (two) times daily. 07/06/14  Yes Thurnell Lose, MD  cyclobenzaprine (FLEXERIL) 10 MG tablet Take by mouth.    Historical Provider,  MD  glucagon (GLUCAGON EMERGENCY) 1 MG injection Inject 1 mg into the muscle once as needed (severe hypoglycemia). 08/14/14   Arnoldo Morale, MD  ondansetron (ZOFRAN ODT) 4 MG disintegrating tablet 4mg  ODT q4 hours prn nausea/vomit 11/28/14   Milton Ferguson, MD  promethazine (PHENERGAN) 25 MG suppository Place 1 suppository (25 mg total) rectally every 6 (six) hours as needed for nausea or vomiting. 03/27/15   Nirav Sweda, PA-C  promethazine (PHENERGAN) 25 MG tablet Take 1 tablet (25 mg total) by mouth every 6 (six) hours as needed for nausea or vomiting. 03/27/15   Gwenlyn Hottinger, PA-C  traMADol (ULTRAM) 50 MG tablet Take 1 tablet (50 mg total) by mouth every 6 (six) hours as needed. 01/10/15   Daleen Bo, MD   BP 105/74 mmHg  Pulse 78  Temp(Src) 98.3 F (36.8 C) (Oral)  Resp 18  SpO2 100% Physical Exam  Constitutional: She is oriented  to person, place, and time. She appears well-developed. No distress.  Poorly nourished  HENT:  Head: Normocephalic and atraumatic.  Mouth/Throat: Oropharynx is clear and moist.  Eyes: Conjunctivae and EOM are normal. Pupils are equal, round, and reactive to light.  Neck: Normal range of motion.  Cardiovascular: Normal rate, regular rhythm and intact distal pulses.   Pulmonary/Chest: Effort normal and breath sounds normal.  Abdominal: Soft. There is tenderness.  Mild, diffuse tenderness to palpation with no guarding or rebound.  Murphy sign negative, no tenderness to palpation over McBurney's point, Rovsings, Psoas and obturator all negative.   Musculoskeletal: Normal range of motion.  Neurological: She is alert and oriented to person, place, and time.  Skin: She is not diaphoretic.  Psychiatric: She has a normal mood and affect.  Nursing note and vitals reviewed.   ED Course  Procedures (including critical care time) Labs Review Labs Reviewed  COMPREHENSIVE METABOLIC PANEL - Abnormal; Notable for the following:    Sodium 130 (*)    Chloride  91 (*)    Glucose, Bld 552 (*)    AST 96 (*)    ALT 101 (*)    All other components within normal limits  URINALYSIS, ROUTINE W REFLEX MICROSCOPIC (NOT AT [facility]) - Abnormal; Notable for the following:    Specific Gravity, Urine 1.037 (*)    Glucose, UA >1000 (*)    All other components within normal limits  URINE MICROSCOPIC-ADD ON - Abnormal; Notable for the following:    Squamous Epithelial / LPF 0-5 (*)    Bacteria, UA RARE (*)    All other components within normal limits  CBG MONITORING, ED - Abnormal; Notable for the following:    Glucose-Capillary 400 (*)    All other components within normal limits  C DIFFICILE QUICK SCREEN W PCR REFLEX  LIPASE, BLOOD  CBC  URINE RAPID DRUG SCREEN, HOSP PERFORMED  PREGNANCY, URINE    Imaging Review No results found. I have personally reviewed and evaluated these images and lab results as part of my medical decision-making.   EKG Interpretation None      MDM   Final diagnoses:  Nausea vomiting and diarrhea  Hyperglycemia without ketosis  Transaminitis    Filed Vitals:   03/27/15 1630 03/27/15 2012 03/27/15 2119  BP: 107/75 105/79 105/74  Pulse: 117 100 78  Temp: 97.9 F (36.6 C) 98.3 F (36.8 C)   TempSrc: Oral Oral   Resp: 20 18 18   SpO2: 100% 100% 100%    Medications  metoCLOPramide (REGLAN) injection 10 mg (10 mg Intravenous Refused 03/27/15 1908)  dicyclomine (BENTYL) capsule 10 mg (10 mg Oral Refused 03/27/15 1911)  sodium chloride 0.9 % bolus 1,000 mL (0 mLs Intravenous Stopped 03/27/15 2047)  famotidine (PEPCID) IVPB 20 mg premix (0 mg Intravenous Stopped 03/27/15 2047)  promethazine (PHENERGAN) injection 25 mg (25 mg Intravenous Given 03/27/15 2047)  HYDROcodone-acetaminophen (NORCO/VICODIN) 5-325 MG per tablet 1 tablet (1 tablet Oral Given 03/27/15 2044)    [name] is 35 y.o. female presenting with nausea vomiting diarrhea and diffuse abdominal pain. Abdominal exam is nonfocal. Patient overall well appearing,  appears well-hydrated although malnourished at her baseline.  Blood work with hyperglycemia 552, sodium is mildly depleted at 1:30, likely secondary to the blood sugar. She has mild transaminitis at 96 AST and an ALT of 101. States she has fatty liver. Repeat abdominal exam is benign. Patient is requesting Dilaudid. A have explained to her that that is not indicated for  diarrhea. Bentyl Reglan. Patient by mouth challenged with Vicodin and she was able to keep it down.  Patient's blood sugar is elevated at 522 however, she has a normal anion gap and no ketones in her urine.  Evaluation does not show pathology that would require ongoing emergent intervention or inpatient treatment. Pt is hemodynamically stable and mentating appropriately. Discussed findings and plan with patient/guardian, who agrees with care plan. All questions answered. Return precautions discussed and outpatient follow up given.      Monico Blitz, PA-C 03/27/15 2126  Carmin Muskrat, MD 03/27/15 2351

## 2015-03-27 NOTE — ED Notes (Signed)
Elmyra Ricks EDPA at bedside

## 2015-03-31 ENCOUNTER — Telehealth: Payer: Self-pay

## 2015-03-31 NOTE — Telephone Encounter (Signed)
Call received from the patient requesting an appointment with Dr Jarold Song. She stated that she was in the ED last week and needs to schedule a follow up appointment and she  would like to establish care with a new PCP.   An appointment was scheduled for 04/01/15 @ 0945 with Dr Jarold Song.  She stated that she has transportation and her mother will accompany her to the appointment.

## 2015-04-01 ENCOUNTER — Ambulatory Visit: Payer: Medicaid Other | Attending: Family Medicine | Admitting: Family Medicine

## 2015-04-01 ENCOUNTER — Encounter: Payer: Self-pay | Admitting: Family Medicine

## 2015-04-01 VITALS — BP 107/73 | HR 105 | Temp 98.1°F | Resp 15 | Ht 65.0 in | Wt 92.6 lb

## 2015-04-01 DIAGNOSIS — R1084 Generalized abdominal pain: Secondary | ICD-10-CM | POA: Diagnosis not present

## 2015-04-01 DIAGNOSIS — Z79899 Other long term (current) drug therapy: Secondary | ICD-10-CM | POA: Insufficient documentation

## 2015-04-01 DIAGNOSIS — G4700 Insomnia, unspecified: Secondary | ICD-10-CM

## 2015-04-01 DIAGNOSIS — R197 Diarrhea, unspecified: Secondary | ICD-10-CM | POA: Diagnosis not present

## 2015-04-01 DIAGNOSIS — R109 Unspecified abdominal pain: Secondary | ICD-10-CM | POA: Diagnosis present

## 2015-04-01 DIAGNOSIS — E1043 Type 1 diabetes mellitus with diabetic autonomic (poly)neuropathy: Secondary | ICD-10-CM

## 2015-04-01 DIAGNOSIS — E1065 Type 1 diabetes mellitus with hyperglycemia: Secondary | ICD-10-CM | POA: Diagnosis not present

## 2015-04-01 DIAGNOSIS — Z794 Long term (current) use of insulin: Secondary | ICD-10-CM | POA: Insufficient documentation

## 2015-04-01 DIAGNOSIS — Z9114 Patient's other noncompliance with medication regimen: Secondary | ICD-10-CM | POA: Diagnosis not present

## 2015-04-01 DIAGNOSIS — K909 Intestinal malabsorption, unspecified: Secondary | ICD-10-CM | POA: Diagnosis not present

## 2015-04-01 DIAGNOSIS — E43 Unspecified severe protein-calorie malnutrition: Secondary | ICD-10-CM

## 2015-04-01 DIAGNOSIS — K3184 Gastroparesis: Secondary | ICD-10-CM

## 2015-04-01 LAB — POCT URINALYSIS DIPSTICK
Bilirubin, UA: NEGATIVE
GLUCOSE UA: 500
Ketones, UA: NEGATIVE
Leukocytes, UA: NEGATIVE
NITRITE UA: NEGATIVE
Protein, UA: NEGATIVE
RBC UA: NEGATIVE
Spec Grav, UA: <=1.005
Urobilinogen, UA: 0.2
pH, UA: 6

## 2015-04-01 LAB — GLUCOSE, POCT (MANUAL RESULT ENTRY): POC Glucose: 408 mg/dl — AB (ref 70–99)

## 2015-04-01 MED ORDER — TRAZODONE HCL 50 MG PO TABS: 50.0000 mg | ORAL_TABLET | Freq: Every evening | ORAL | Status: DC | PRN

## 2015-04-01 MED ORDER — ACETAMINOPHEN-CODEINE #3 300-30 MG PO TABS: 1.0000 | ORAL_TABLET | Freq: Two times a day (BID) | ORAL | Status: DC | PRN

## 2015-04-01 MED ORDER — ALPRAZOLAM 0.5 MG PO TABS: 0.5000 mg | ORAL_TABLET | Freq: Two times a day (BID) | ORAL | Status: DC | PRN

## 2015-04-01 NOTE — Progress Notes (Signed)
Subjective:  Patient ID: [name], female    DOB: 10/15/1980  Age: 35 y.o. MRN: [id number]  CC: Abdominal Pain   HPI [name] is a 35 year old female with a history of type 1 diabetes mellitus, gastroparesis secondary to diabetes, chronic diarrhea , pancreatic insufficiency with multiple ED visits for severe abdominal pain , chronic diarrhea and hyperglycemia.  She was hospitalized in 02/2015 for hyperglycemic hyperosmolar nonketotic coma and again had an ED presentation 5 days ago with diffuse abdominal pain and vomiting associated with diarrhea and was found to be hyperglycemic at the time. She received treatment and was subsequently discharged on Reglan and Bentyl. She has had extensive GI workup for chronic diarrhea - She underwent flexible sigmoidoscopy in 06/2014 which showed no pseudo-membranous colitis and EGD revealed no anatomical outlet obstruction.   She has been on Flagyl for treatment for chronic C. Difficile and was also diagnosed with Campylobacter jejuni infection. She continues to see gastroenterology at [facility] for further workup.  Her endocrinologist is at [facility]; records from care every where reviewed. She is supposed to remain on 9 units of Lantus twice daily and carb counting with 1 units of insulin to 15 g of carbs.  She complains to me of having several bowel movements a day greater than 15 and sometimes moves bowel in her sleep(has to wear diapers). She is requesting something to help with sleep. She is afraid to eat because whenever she does she has to move her bowel and has also not been compliant with her insulin because she says she is very sensitive and her sugars drop rapidly when she takes insulin. She is currently on Xanax chronically which she was receiving from her previous PCP-Dr. [name], she no longer wants to see and would like to establish care here.    Outpatient Prescriptions Prior to Visit    Medication Sig Dispense Refill  . acetaminophen (TYLENOL) 500 MG tablet Take 1,000 mg by mouth every 6 (six) hours as needed for moderate pain.     Marland Kitchen amitriptyline (ELAVIL) 50 MG tablet Take 2 tablets (100 mg total) by mouth at bedtime. 60 tablet 0  . CALCIUM PO Take 2 tablets by mouth daily.    . cholestyramine (QUESTRAN) 4 G packet Take 1 packet (4 g total) by mouth 2 (two) times daily. (Patient taking differently: Take 4 g by mouth 4 (four) times daily. ) 60 each 12  . CREON 36000 UNITS CPEP capsule Take 4-6 capsules by mouth. Takes 6 capsules with every meal and 4 capsules with every snack.  1  . cyanocobalamin 500 MCG tablet Take 2,000 mcg by mouth 2 (two) times daily as needed (mouth sores).     . feeding supplement, GLUCERNA SHAKE, (GLUCERNA SHAKE) LIQD Take 237 mLs by mouth 3 (three) times daily between meals. (Patient taking differently: Take 237 mLs by mouth 4 (four) times daily. ) 90 Can 0  . gabapentin (NEURONTIN) 100 MG capsule Take 300 mg by mouth at bedtime.     Marland Kitchen glucagon (GLUCAGON EMERGENCY) 1 MG injection Inject 1 mg into the muscle once as needed (severe hypoglycemia). 2 each prn  . insulin aspart (NOVOLOG FLEXPEN) 100 UNIT/ML FlexPen Inject 0-5 Units into the skin 3 (three) times daily as needed for high blood sugar.     . insulin glargine (LANTUS) 100 UNIT/ML injection Inject 0.07-0.08 mLs (7-8 Units total) into the skin 2 (two) times daily. 8 units in the  morning and 7 units at night 10 mL 3  . Multiple Vitamin (MULTIVITAMIN WITH MINERALS) TABS Take 2 tablets by mouth daily. Diabetic support vitamin    . omeprazole (PRILOSEC) 40 MG capsule Take 40 mg by mouth daily as needed (indigestion).     . Potassium (POTASSIMIN PO) Take 2 capsules by mouth daily.    . Probiotic Product (DIGESTIVE ADVANTAGE GUMMIES PO) Take 2 tablets by mouth 2 (two) times daily.    . promethazine (PHENERGAN) 25 MG suppository Place 1 suppository (25 mg total) rectally every 6 (six) hours as needed for  nausea or vomiting. 11 suppository 0  . promethazine (PHENERGAN) 25 MG tablet Take 1 tablet (25 mg total) by mouth every 6 (six) hours as needed for nausea or vomiting. 12 tablet 0  . saccharomyces boulardii (FLORASTOR) 250 MG capsule Take 1 capsule (250 mg total) by mouth 2 (two) times daily. 60 capsule 2  . ALPRAZolam (XANAX) 1 MG tablet Take 2 mg by mouth 2 (two) times daily as needed for anxiety.     . cyclobenzaprine (FLEXERIL) 10 MG tablet Take by mouth.    . ondansetron (ZOFRAN ODT) 4 MG disintegrating tablet 4mg  ODT q4 hours prn nausea/vomit 15 tablet 0  . dicyclomine (BENTYL) 10 MG capsule Take 10 mg by mouth 4 (four) times daily -  before meals and at bedtime. Reported on 04/01/2015    . diphenoxylate-atropine (LOMOTIL) 2.5-0.025 MG per tablet Take 1 tablet by mouth 4 (four) times daily as needed for diarrhea or loose stools. (Patient not taking: Reported on 04/01/2015) 30 tablet 0  . Eluxadoline (VIBERZI) 100 MG TABS Take 100 mg by mouth 2 (two) times daily. Reported on 04/01/2015    . metoCLOPramide (REGLAN) 10 MG tablet Take 1 tablet (10 mg total) by mouth every 6 (six) hours as needed for nausea or vomiting. (Patient not taking: Reported on 04/01/2015) 30 tablet 0  . oxyCODONE-acetaminophen (PERCOCET) 10-325 MG tablet Take 0.5 tablets by mouth every 6 (six) hours as needed for pain. Reported on 04/01/2015    . traMADol (ULTRAM) 50 MG tablet Take 1 tablet (50 mg total) by mouth every 6 (six) hours as needed. (Patient not taking: Reported on 04/01/2015) 30 tablet 0   No facility-administered medications prior to visit.    ROS Review of Systems General: negative for fever, weight loss, appetite change Eyes: no visual symptoms. ENT: no ear symptoms, no sinus tenderness, no nasal congestion or sore throat. Neck: no pain  Respiratory: no wheezing, shortness of breath, cough Cardiovascular: no chest pain, no dyspnea on exertion, no pedal edema, no orthopnea. Gastrointestinal: see  hpi Genito-Urinary: no urinary frequency, no dysuria, no polyuria. Hematologic: no bruising Endocrine: no cold or heat intolerance Neurological: no headaches, no seizures, no tremors Musculoskeletal: no joint pains, no joint swelling Skin: no pruritus, no rash. Psychological: no depression, no anxiety,  Objective:  BP 107/73 mmHg  Pulse 105  Temp(Src) 98.1 F (36.7 C)  Resp 15  Ht 5\' 5"  (1.651 m)  Wt 92 lb 9.6 oz (42.003 kg)  BMI 15.41 kg/m2  SpO2 100%  BP/Weight 04/01/2015 03/27/2015 [id number]  Systolic BP [id number] 123456 91  Diastolic BP 73 74 53  Wt. (Lbs) 92.6 - 97.2  BMI 15.41 - 16.18      Physical Exam Constitutional: thin-appearing Eyes: PERRLA HEENT: Head is atraumatic, normal sinuses, normal oropharynx, normal appearing tonsils and palate, tympanic membrane is normal bilaterally. Neck: normal range of motion, no thyromegaly, no JVD Cardiovascular: Tachycardic rate and  regular rhythm, normal heart sounds, no murmurs, rub or gallop, no pedal edema Respiratory: clear to auscultation bilaterally, no wheezes, no rales, no rhonchi Abdomen: soft, Tender to palpation, hyperactive bowel sounds. Extremities: Full ROM, no tenderness in joints Skin: warm and dry, no lesions. Neurological: alert, oriented x3, cranial nerves I-XII grossly intact , normal motor strength, normal sensation. Psychological: normal mood.   Assessment & Plan:   1. Type 1 diabetes mellitus with hyperglycemia (HCC) A1c of 13.2, CBG of 408. Patient is resistant to receiving insulin because it usually causes hypoglycemia she says. She follows up with endocrine at [facility] and has an upcoming appointment in 2 weeks. - Glucose (CBG) - Urinalysis Dipstick  2. Diarrhea due to malabsorption Continue Lomotil and Bentyl Currently undergoing workup by [place] gastroenterology to determine etiology  3. Diabetic gastroparesis associated with type 1 diabetes mellitus (HCC) Promethazine as needed  4. Protein-calorie  malnutrition, severe ([place]) Secondary to severe diarrhea  5. Insomnia - traZODone (DESYREL) 50 MG tablet; Take 1 tablet (50 mg total) by mouth at bedtime as needed for sleep.  Dispense: 30 tablet; Refill: 2  6. Generalized abdominal pain - acetaminophen-codeine (TYLENOL #3) 300-30 MG tablet; Take 1 tablet by mouth every 12 (twelve) hours as needed for moderate pain.  Dispense: 60 tablet; Refill: 1  she is currently on Xanax which she was receiving from her previous PCP; I have explained to her that I will not be keeping her on this medication but will be weaning her off.  Meds ordered this encounter  Medications  . ALPRAZolam (XANAX) 0.5 MG tablet    Sig: Take 1 tablet (0.5 mg total) by mouth 2 (two) times daily as needed for anxiety. For 2 weeks then 0.5mg  dailyprn    Dispense:  45 tablet    Refill:  0  . acetaminophen-codeine (TYLENOL #3) 300-30 MG tablet    Sig: Take 1 tablet by mouth every 12 (twelve) hours as needed for moderate pain.    Dispense:  60 tablet    Refill:  1  . traZODone (DESYREL) 50 MG tablet    Sig: Take 1 tablet (50 mg total) by mouth at bedtime as needed for sleep.    Dispense:  30 tablet    Refill:  2    Follow-up: Return in about 1 month (around 04/29/2015) for Follow-up of abdominal pain.   Arnoldo Morale MD

## 2015-04-01 NOTE — Patient Instructions (Signed)
Diabetes Mellitus and Food It is important for you to manage your blood sugar (glucose) level. Your blood glucose level can be greatly affected by what you eat. Eating healthier foods in the appropriate amounts throughout the day at about the same time each day will help you control your blood glucose level. It can also help slow or prevent worsening of your diabetes mellitus. Healthy eating may even help you improve the level of your blood pressure and reach or maintain a healthy weight.  General recommendations for healthful eating and cooking habits include:  Eating meals and snacks regularly. Avoid going long periods of time without eating to lose weight.  Eating a diet that consists mainly of plant-based foods, such as fruits, vegetables, nuts, legumes, and whole grains.  Using low-heat cooking methods, such as baking, instead of high-heat cooking methods, such as deep frying. Work with your dietitian to make sure you understand how to use the Nutrition Facts information on food labels. HOW CAN FOOD AFFECT ME? Carbohydrates Carbohydrates affect your blood glucose level more than any other type of food. Your dietitian will help you determine how many carbohydrates to eat at each meal and teach you how to count carbohydrates. Counting carbohydrates is important to keep your blood glucose at a healthy level, especially if you are using insulin or taking certain medicines for diabetes mellitus. Alcohol Alcohol can cause sudden decreases in blood glucose (hypoglycemia), especially if you use insulin or take certain medicines for diabetes mellitus. Hypoglycemia can be a life-threatening condition. Symptoms of hypoglycemia (sleepiness, dizziness, and disorientation) are similar to symptoms of having too much alcohol.  If your health care provider has given you approval to drink alcohol, do so in moderation and use the following guidelines:  Women should not have more than one drink per day, and men  should not have more than two drinks per day. One drink is equal to:  12 oz of beer.  5 oz of wine.  1 oz of hard liquor.  Do not drink on an empty stomach.  Keep yourself hydrated. Have water, diet soda, or unsweetened iced tea.  Regular soda, juice, and other mixers might contain a lot of carbohydrates and should be counted. WHAT FOODS ARE NOT RECOMMENDED? As you make food choices, it is important to remember that all foods are not the same. Some foods have fewer nutrients per serving than other foods, even though they might have the same number of calories or carbohydrates. It is difficult to get your body what it needs when you eat foods with fewer nutrients. Examples of foods that you should avoid that are high in calories and carbohydrates but low in nutrients include:  Trans fats (most processed foods list trans fats on the Nutrition Facts label).  Regular soda.  Juice.  Candy.  Sweets, such as cake, pie, doughnuts, and cookies.  Fried foods. WHAT FOODS CAN I EAT? Eat nutrient-rich foods, which will nourish your body and keep you healthy. The food you should eat also will depend on several factors, including:  The calories you need.  The medicines you take.  Your weight.  Your blood glucose level.  Your blood pressure level.  Your cholesterol level. You should eat a variety of foods, including:  Protein.  Lean cuts of meat.  Proteins low in saturated fats, such as fish, egg whites, and beans. Avoid processed meats.  Fruits and vegetables.  Fruits and vegetables that may help control blood glucose levels, such as apples, mangoes, and   yams.  Dairy products.  Choose fat-free or low-fat dairy products, such as milk, yogurt, and cheese.  Grains, bread, pasta, and rice.  Choose whole grain products, such as multigrain bread, whole oats, and brown rice. These foods may help control blood pressure.  Fats.  Foods containing healthful fats, such as nuts,  avocado, olive oil, canola oil, and fish. DOES EVERYONE WITH DIABETES MELLITUS HAVE THE SAME MEAL PLAN? Because every person with diabetes mellitus is different, there is not one meal plan that works for everyone. It is very important that you meet with a dietitian who will help you create a meal plan that is just right for you.   This information is not intended to replace advice given to you by your health care provider. Make sure you discuss any questions you have with your health care provider.   Document Released: 11/05/2004 Document Revised: 03/01/2014 Document Reviewed: 01/05/2013 Elsevier Interactive Patient Education 2016 Elsevier Inc.  

## 2015-04-01 NOTE — Progress Notes (Signed)
Patient here for chronic abdominal pain with extensive work up at [facility] She states she does not know if she needs to be on bentyl or lomotil She needs refill on her Tramadol for pain She does see Endocrinologist at [facility] who suggests patient be back on her insulin pump Did not take her insulin this am and has not eaten

## 2015-04-06 ENCOUNTER — Emergency Department (HOSPITAL_COMMUNITY)
Admission: EM | Admit: 2015-04-06 | Discharge: 2015-04-06 | Disposition: A | Discharge status: Home / Self Care | Payer: Medicaid Other | Attending: Emergency Medicine | Admitting: Emergency Medicine

## 2015-04-06 ENCOUNTER — Encounter (HOSPITAL_COMMUNITY): Payer: Self-pay | Admitting: Emergency Medicine

## 2015-04-06 DIAGNOSIS — R634 Abnormal weight loss: Secondary | ICD-10-CM | POA: Diagnosis not present

## 2015-04-06 DIAGNOSIS — E1065 Type 1 diabetes mellitus with hyperglycemia: Secondary | ICD-10-CM | POA: Insufficient documentation

## 2015-04-06 DIAGNOSIS — R197 Diarrhea, unspecified: Secondary | ICD-10-CM | POA: Insufficient documentation

## 2015-04-06 DIAGNOSIS — R109 Unspecified abdominal pain: Secondary | ICD-10-CM | POA: Insufficient documentation

## 2015-04-06 DIAGNOSIS — R1110 Vomiting, unspecified: Secondary | ICD-10-CM | POA: Diagnosis not present

## 2015-04-06 HISTORY — DX: Type 2 diabetes mellitus without complications: E11.9

## 2015-04-06 LAB — URINALYSIS, ROUTINE W REFLEX MICROSCOPIC
BILIRUBIN URINE: NEGATIVE
Glucose, UA: >1000 mg/dL — AB
HGB URINE DIPSTICK: NEGATIVE
Ketones, ur: NEGATIVE mg/dL
Leukocytes, UA: NEGATIVE
NITRITE: NEGATIVE
PH: 6.5 (ref 5.0–8.0)
Protein, ur: NEGATIVE mg/dL
SPECIFIC GRAVITY, URINE: 1.041 — AB (ref 1.005–1.030)

## 2015-04-06 LAB — BASIC METABOLIC PANEL
ANION GAP: 13 (ref 5–15)
BUN: 7 mg/dL (ref 6–20)
CHLORIDE: 93 mmol/L — AB (ref 101–111)
CO2: 26 mmol/L (ref 22–32)
Calcium: 9.5 mg/dL (ref 8.9–10.3)
Creatinine, Ser: 0.89 mg/dL (ref 0.44–1.00)
GFR calc non Af Amer: >60 mL/min (ref >60)
GLUCOSE: 626 mg/dL — AB (ref 65–99)
POTASSIUM: 4.3 mmol/L (ref 3.5–5.1)
Sodium: 132 mmol/L — ABNORMAL LOW (ref 135–145)

## 2015-04-06 LAB — CBC
HEMATOCRIT: 39.3 % (ref 36.0–46.0)
HEMOGLOBIN: 12.7 g/dL (ref 12.0–15.0)
MCH: 27.1 pg (ref 26.0–34.0)
MCHC: 32.3 g/dL (ref 30.0–36.0)
MCV: 84 fL (ref 78.0–100.0)
Platelets: 219 10*3/uL (ref 150–400)
RBC: 4.68 MIL/uL (ref 3.87–5.11)
RDW: 13.2 % (ref 11.5–15.5)
WBC: 7.7 10*3/uL (ref 4.0–10.5)

## 2015-04-06 LAB — CBG MONITORING, ED: Glucose-Capillary: 513 mg/dL — ABNORMAL HIGH (ref 65–99)

## 2015-04-06 LAB — URINE MICROSCOPIC-ADD ON: BACTERIA UA: NONE SEEN

## 2015-04-06 NOTE — ED Notes (Signed)
CHECKED CBG 513, RN CHRISTINA INFORMED

## 2015-04-06 NOTE — ED Notes (Signed)
Darrell, EMT advised this nurse that lab called to report a glucose of 626.  Called pt at home number in chart and advised of lab value and that she would need to come back to this ED or ED of choice and I am unable to give any advice.  Pt verbalized understanding.

## 2015-04-06 NOTE — ED Notes (Signed)
Pt advised Karena Addison, Network engineer that she is leaving.

## 2015-04-06 NOTE — ED Notes (Signed)
Pt states shes a type 1 diabetic, chronic issues x2 years. Pt c/o diarrhea x1 year, and increased weight loss. Pt came in today for vomiting and diarrhea and abdominal pain.

## 2015-04-08 ENCOUNTER — Emergency Department (HOSPITAL_COMMUNITY)
Admission: EM | Admit: 2015-04-08 | Discharge: 2015-04-08 | Disposition: A | Discharge status: Home / Self Care | Payer: Medicaid Other | Attending: Emergency Medicine | Admitting: Emergency Medicine

## 2015-04-08 ENCOUNTER — Encounter (HOSPITAL_COMMUNITY): Payer: Self-pay | Admitting: Neurology

## 2015-04-08 DIAGNOSIS — Z794 Long term (current) use of insulin: Secondary | ICD-10-CM | POA: Diagnosis not present

## 2015-04-08 DIAGNOSIS — R197 Diarrhea, unspecified: Secondary | ICD-10-CM | POA: Insufficient documentation

## 2015-04-08 DIAGNOSIS — Z91040 Latex allergy status: Secondary | ICD-10-CM | POA: Diagnosis not present

## 2015-04-08 DIAGNOSIS — R1110 Vomiting, unspecified: Secondary | ICD-10-CM | POA: Diagnosis present

## 2015-04-08 DIAGNOSIS — R110 Nausea: Secondary | ICD-10-CM | POA: Insufficient documentation

## 2015-04-08 DIAGNOSIS — K529 Noninfective gastroenteritis and colitis, unspecified: Secondary | ICD-10-CM

## 2015-04-08 DIAGNOSIS — R1084 Generalized abdominal pain: Secondary | ICD-10-CM | POA: Insufficient documentation

## 2015-04-08 DIAGNOSIS — Z87891 Personal history of nicotine dependence: Secondary | ICD-10-CM | POA: Insufficient documentation

## 2015-04-08 DIAGNOSIS — E109 Type 1 diabetes mellitus without complications: Secondary | ICD-10-CM | POA: Diagnosis not present

## 2015-04-08 DIAGNOSIS — Z3202 Encounter for pregnancy test, result negative: Secondary | ICD-10-CM | POA: Insufficient documentation

## 2015-04-08 DIAGNOSIS — Z79899 Other long term (current) drug therapy: Secondary | ICD-10-CM | POA: Diagnosis not present

## 2015-04-08 DIAGNOSIS — G8929 Other chronic pain: Secondary | ICD-10-CM

## 2015-04-08 DIAGNOSIS — R109 Unspecified abdominal pain: Secondary | ICD-10-CM

## 2015-04-08 LAB — COMPREHENSIVE METABOLIC PANEL
ALBUMIN: 3.4 g/dL — AB (ref 3.5–5.0)
ALT: 37 U/L (ref 14–54)
ANION GAP: 12 (ref 5–15)
AST: 25 U/L (ref 15–41)
Alkaline Phosphatase: 71 U/L (ref 38–126)
BILIRUBIN TOTAL: 0.3 mg/dL (ref 0.3–1.2)
BUN: 6 mg/dL (ref 6–20)
CHLORIDE: 97 mmol/L — AB (ref 101–111)
CO2: 29 mmol/L (ref 22–32)
Calcium: 9.1 mg/dL (ref 8.9–10.3)
Creatinine, Ser: 0.75 mg/dL (ref 0.44–1.00)
GFR calc Af Amer: >60 mL/min (ref >60)
GFR calc non Af Amer: >60 mL/min (ref >60)
GLUCOSE: 322 mg/dL — AB (ref 65–99)
POTASSIUM: 4.2 mmol/L (ref 3.5–5.1)
SODIUM: 138 mmol/L (ref 135–145)
Total Protein: 6.7 g/dL (ref 6.5–8.1)

## 2015-04-08 LAB — URINE MICROSCOPIC-ADD ON

## 2015-04-08 LAB — CBG MONITORING, ED: GLUCOSE-CAPILLARY: 260 mg/dL — AB (ref 65–99)

## 2015-04-08 LAB — CBC
HEMATOCRIT: 32.8 % — AB (ref 36.0–46.0)
HEMOGLOBIN: 11 g/dL — AB (ref 12.0–15.0)
MCH: 27.9 pg (ref 26.0–34.0)
MCHC: 33.5 g/dL (ref 30.0–36.0)
MCV: 83.2 fL (ref 78.0–100.0)
Platelets: 245 10*3/uL (ref 150–400)
RBC: 3.94 MIL/uL (ref 3.87–5.11)
RDW: 13.5 % (ref 11.5–15.5)
WBC: 5.5 10*3/uL (ref 4.0–10.5)

## 2015-04-08 LAB — URINALYSIS, ROUTINE W REFLEX MICROSCOPIC
BILIRUBIN URINE: NEGATIVE
Hgb urine dipstick: NEGATIVE
KETONES UR: NEGATIVE mg/dL
Leukocytes, UA: NEGATIVE
Nitrite: NEGATIVE
PH: 5 (ref 5.0–8.0)
Protein, ur: NEGATIVE mg/dL
SPECIFIC GRAVITY, URINE: 1.043 — AB (ref 1.005–1.030)

## 2015-04-08 LAB — I-STAT BETA HCG BLOOD, ED (MC, WL, AP ONLY): I-stat hCG, quantitative: <5 m[IU]/mL (ref <5)

## 2015-04-08 LAB — LIPASE, BLOOD: LIPASE: 14 U/L (ref 11–51)

## 2015-04-08 MED ORDER — DICYCLOMINE HCL 10 MG PO CAPS
10.0000 mg | ORAL_CAPSULE | Freq: Once | ORAL | Status: AC
  Administered 2015-04-08: 10 mg via ORAL
  Filled 2015-04-08: qty 1

## 2015-04-08 MED ORDER — PROCHLORPERAZINE MALEATE 10 MG PO TABS: 10.0000 mg | ORAL_TABLET | Freq: Two times a day (BID) | ORAL | Status: DC | PRN

## 2015-04-08 MED ORDER — PROCHLORPERAZINE EDISYLATE 5 MG/ML IJ SOLN
10.0000 mg | Freq: Four times a day (QID) | INTRAMUSCULAR | Status: DC | PRN
  Administered 2015-04-08: 10 mg via INTRAVENOUS
  Filled 2015-04-08: qty 2

## 2015-04-08 MED ORDER — FENTANYL CITRATE (PF) 100 MCG/2ML IJ SOLN
50.0000 ug | Freq: Once | INTRAMUSCULAR | Status: AC
  Administered 2015-04-08: 50 ug via INTRAVENOUS
  Filled 2015-04-08: qty 2

## 2015-04-08 MED ORDER — HYDROCODONE-ACETAMINOPHEN 5-325 MG PO TABS
1.0000 | ORAL_TABLET | Freq: Once | ORAL | Status: AC
  Administered 2015-04-08: 1 via ORAL
  Filled 2015-04-08: qty 1

## 2015-04-08 MED ORDER — DICYCLOMINE HCL 10 MG/ML IM SOLN
10.0000 mg | Freq: Once | INTRAMUSCULAR | Status: DC
  Filled 2015-04-08: qty 2

## 2015-04-08 MED ORDER — PROMETHAZINE HCL 25 MG/ML IJ SOLN: 25.0000 mg | Freq: Once | INTRAMUSCULAR | Status: DC

## 2015-04-08 MED ORDER — SODIUM CHLORIDE 0.9 % IV BOLUS (SEPSIS)
1000.0000 mL | Freq: Once | INTRAVENOUS | Status: AC
Start: 2015-04-08 — End: 2015-04-08
  Administered 2015-04-08: 1000 mL via INTRAVENOUS

## 2015-04-08 NOTE — ED Provider Notes (Signed)
History  By signing my name below, I, Marlowe Kays, attest that this documentation has been prepared under the direction and in the presence of Julionna Marczak, [place]. Electronically Signed: Marlowe Kays, ED Scribe. 04/08/2015. 2:59 PM  Chief Complaint  Patient presents with  . Emesis   The history is provided by the patient and medical records. No language interpreter was used.    HPI Comments:  [name] is a 35 y.o. female with PMHx of T1DM, chronic abdominal pain/diarrhea, and c. diff (approximately one year ago) who presents to the Emergency Department complaining of worsening vomiting (10-20 episodes daily), nausea and diarrhea (around 50+ episodes daily) that began approximately one week ago. She reports associated severe abdominal pain, chills and subjective fever. She states she is still symptomatic from her last diagnosis of c. diff but the symptoms have gotten worse. She rates her pain at 9/10. She reports not being able to keep anything down. She reports dysuria and rectal rawness from the diarrhea. Pt has used a Phenergan suppository that she expelled and Tylenol #3 that she states did not stay on her stomach long. Eating increases her symptoms. She denies alleviating symptoms. She denies hematochezia, hematemesis. Pt has been here 16 times in the past 6 months with similar complaints. Pt's PCP is Dr. Jarold Song at [facility]. Her GI specialist is Dr. Lyndel Safe in [place].  Past Medical History  Diagnosis Date  . Diabetes mellitus without complication [facility])    Past Surgical History  Procedure Laterality Date  . Laparoscopy  02/2002, 06/2010    laparoscopy with lysis pelvic adhesions for pelvic pain  . Back surgery      age 29  . Tooth extraction    . Root canal  10/10/12  . Flexible sigmoidoscopy N/A 06/24/2014    Procedure: FLEXIBLE SIGMOIDOSCOPY;  Surgeon: Milus Banister, MD;  Location: [facility] ENDOSCOPY;  Service: Endoscopy;  Laterality: N/A;  .  Esophagogastroduodenoscopy (egd) with propofol N/A 06/27/2014    Procedure: ESOPHAGOGASTRODUODENOSCOPY (EGD) WITH PROPOFOL;  Surgeon: Milus Banister, MD;  Location: [facility] ENDOSCOPY;  Service: Endoscopy;  Laterality: N/A;   Family History  Problem Relation Age of Onset  . Diabetes Maternal Grandmother   . Cancer Maternal Grandmother     Leukemia  . Cancer Maternal Grandfather     Small cell lung cancer   Social History  Substance Use Topics  . Smoking status: Former Smoker    Quit date: 04/19/2000  . Smokeless tobacco: Former Systems developer  . Alcohol Use: No     Comment: RARE SIPS OF WINE   OB History    No data available     Review of Systems  A complete 10 system review of systems was obtained and all systems are negative except as noted in the HPI and PMH.   Allergies  Scallops; Diphenhydramine hcl; Haloperidol and related; Ketorolac; Morphine and related; Toradol; Latex; and Other  Home Medications   Prior to Admission medications   Medication Sig Start Date End Date Taking? Authorizing Provider  acetaminophen (TYLENOL) 500 MG tablet Take 1,000 mg by mouth every 6 (six) hours as needed for moderate pain.     Historical Provider, MD  acetaminophen-codeine (TYLENOL #3) 300-30 MG tablet Take 1 tablet by mouth every 12 (twelve) hours as needed for moderate pain. 04/01/15   Arnoldo Morale, MD  ALPRAZolam Duanne Moron) 0.5 MG tablet Take 1 tablet (0.5 mg total) by mouth 2 (two) times daily as needed for anxiety. For 2 weeks then 0.5mg   dailyprn 04/01/15   Arnoldo Morale, MD  amitriptyline (ELAVIL) 50 MG tablet Take 2 tablets (100 mg total) by mouth at bedtime. 08/07/14   Arnoldo Morale, MD  CALCIUM PO Take 2 tablets by mouth daily.    Historical Provider, MD  cholestyramine Lucrezia Starch) 4 G packet Take 1 packet (4 g total) by mouth 2 (two) times daily. Patient taking differently: Take 4 g by mouth 4 (four) times daily.  09/19/14   Jeffrey Hedges, PA-C  CREON 36000 UNITS CPEP capsule Take 4-6 capsules by mouth.  Takes 6 capsules with every meal and 4 capsules with every snack. 12/02/14   Historical Provider, MD  cyanocobalamin 500 MCG tablet Take 2,000 mcg by mouth 2 (two) times daily as needed (mouth sores).     Historical Provider, MD  dicyclomine (BENTYL) 10 MG capsule Take 10 mg by mouth 4 (four) times daily -  before meals and at bedtime. Reported on 04/01/2015    Historical Provider, MD  diphenoxylate-atropine (LOMOTIL) 2.5-0.025 MG per tablet Take 1 tablet by mouth 4 (four) times daily as needed for diarrhea or loose stools. Patient not taking: Reported on 04/01/2015 11/08/14   Orpah Greek, MD  Eluxadoline (VIBERZI) 100 MG TABS Take 100 mg by mouth 2 (two) times daily. Reported on 04/01/2015    Historical Provider, MD  feeding supplement, GLUCERNA SHAKE, (GLUCERNA SHAKE) LIQD Take 237 mLs by mouth 3 (three) times daily between meals. Patient taking differently: Take 237 mLs by mouth 4 (four) times daily.  07/06/14   Thurnell Lose, MD  gabapentin (NEURONTIN) 100 MG capsule Take 300 mg by mouth at bedtime.     Historical Provider, MD  glucagon (GLUCAGON EMERGENCY) 1 MG injection Inject 1 mg into the muscle once as needed (severe hypoglycemia). 08/14/14   Arnoldo Morale, MD  insulin aspart (NOVOLOG FLEXPEN) 100 UNIT/ML FlexPen Inject 0-5 Units into the skin 3 (three) times daily as needed for high blood sugar.     Historical Provider, MD  insulin glargine (LANTUS) 100 UNIT/ML injection Inject 0.07-0.08 mLs (7-8 Units total) into the skin 2 (two) times daily. 8 units in the morning and 7 units at night 01/15/15   Velvet Bathe, MD  Multiple Vitamin (MULTIVITAMIN WITH MINERALS) TABS Take 2 tablets by mouth daily. Diabetic support vitamin    Historical Provider, MD  omeprazole (PRILOSEC) 40 MG capsule Take 40 mg by mouth daily as needed (indigestion).     Historical Provider, MD  Potassium (POTASSIMIN PO) Take 2 capsules by mouth daily.    Historical Provider, MD  Probiotic Product (DIGESTIVE ADVANTAGE  GUMMIES PO) Take 2 tablets by mouth 2 (two) times daily.    Historical Provider, MD  promethazine (PHENERGAN) 25 MG suppository Place 1 suppository (25 mg total) rectally every 6 (six) hours as needed for nausea or vomiting. 03/27/15   Nicole Pisciotta, PA-C  promethazine (PHENERGAN) 25 MG tablet Take 1 tablet (25 mg total) by mouth every 6 (six) hours as needed for nausea or vomiting. 03/27/15   Nicole Pisciotta, PA-C  saccharomyces boulardii (FLORASTOR) 250 MG capsule Take 1 capsule (250 mg total) by mouth 2 (two) times daily. 07/06/14   Thurnell Lose, MD  traZODone (DESYREL) 50 MG tablet Take 1 tablet (50 mg total) by mouth at bedtime as needed for sleep. 04/01/15   Arnoldo Morale, MD   Triage Vitals: BP 105/70 mmHg  Pulse 107  Temp(Src) 98.7 F (37.1 C) (Oral)  Resp 18  SpO2 100% Physical Exam  Constitutional: She is  oriented to person, place, and time.  Thin, chronically ill appearing  HENT:  Head: Normocephalic and atraumatic.  Mouth/Throat: Mucous membranes are normal.  Eyes: EOM are normal.  Neck: Normal range of motion.  Cardiovascular: Normal rate.   Pulmonary/Chest: Effort normal.  Abdominal: Soft. There is generalized tenderness. There is no rigidity, no rebound, no guarding, no CVA tenderness, no tenderness at McBurney's point and negative Murphy's sign.  Musculoskeletal: Normal range of motion.  Neurological: She is alert and oriented to person, place, and time.  Skin: Skin is warm and dry. There is pallor.  Psychiatric: She has a normal mood and affect. Her behavior is normal.  Nursing note and vitals reviewed.  Filed Vitals:   04/08/15 1230 04/08/15 1511 04/08/15 1651  BP: 105/70 87/51 94/65   Pulse: 107 86 80  Temp: 98.7 F (37.1 C) 98.4 F (36.9 C)   TempSrc: Oral Oral   Resp: 18 16 16   SpO2: 100% 100% 100%     ED Course  Procedures (including critical care time) DIAGNOSTIC STUDIES: Oxygen Saturation is 100% on RA, normal by my interpretation.   COORDINATION  OF CARE: 1:27 PM- Will attempt to place IV because pt states she always has to have US guided IV and order Phenergan because pt states Zofran does not work for her. Pt verbalizes understanding and agrees to plan.  1:35 PM- Phenergan is on back order so ordered changed to Compazine.   Medications  prochlorperazine (COMPAZINE) injection 10 mg (10 mg Intravenous Given 04/08/15 1353)  sodium chloride 0.9 % bolus 1,000 mL (0 mLs Intravenous Stopped 04/08/15 1734)  fentaNYL (SUBLIMAZE) injection 50 mcg (50 mcg Intravenous Given 04/08/15 1352)  dicyclomine (BENTYL) capsule 10 mg (10 mg Oral Given 04/08/15 1435)  HYDROcodone-acetaminophen (NORCO/VICODIN) 5-325 MG per tablet 1 tablet (1 tablet Oral Given 04/08/15 1735)    Labs Review Labs Reviewed  COMPREHENSIVE METABOLIC PANEL - Abnormal; Notable for the following:    Chloride 97 (*)    Glucose, Bld 322 (*)    Albumin 3.4 (*)    All other components within normal limits  CBC - Abnormal; Notable for the following:    Hemoglobin 11.0 (*)    HCT 32.8 (*)    All other components within normal limits  URINALYSIS, ROUTINE W REFLEX MICROSCOPIC (NOT AT [facility]) - Abnormal; Notable for the following:    APPearance HAZY (*)    Specific Gravity, Urine 1.043 (*)    Glucose, UA >1000 (*)    All other components within normal limits  URINE MICROSCOPIC-ADD ON - Abnormal; Notable for the following:    Squamous Epithelial / LPF 0-5 (*)    Bacteria, UA RARE (*)    All other components within normal limits  CBG MONITORING, ED - Abnormal; Notable for the following:    Glucose-Capillary 260 (*)    All other components within normal limits  LIPASE, BLOOD  I-STAT BETA HCG BLOOD, ED ([facility], [facility], [facility] ONLY)    Imaging Review No results found. I have personally reviewed and evaluated these images and lab results as part of my medical decision-making.   EKG Interpretation None      MDM   Final diagnoses:  Chronic abdominal pain  Chronic diarrhea  Chronic  nausea    Pt is an 35 y.o. female with chronic abdominal pain, N/V/D who presents to the ED for evaluation of worsening of aforementioned symptoms. She had CT abd/pelvis during her admission in 02/2015 that showed distension of her stomach but was otherwise  actually improved from prior scans. She follows regularly with Dr. Lyndel Safe (GI) and Dr. Jarold Song (family med). Her labs today are at her baseline. We were able to control her pain and nausea. She is tolerating PO. Initial tachycardia likely secondary to hypovolemia and pain. HR improved by time of discharge. Her BP has remained soft but this appears chronic for her and she is able to ambulate unassisted and remain asymptomatic. She is stable for outpatient f/u with no further emergent workup or imaging today. Rx given for compazine. She declines Bentyl rx. ER return precautions given.   I personally performed the services described in this documentation, which was scribed in my presence. The recorded information has been reviewed and is accurate.     Anne Ng, PA-C 04/08/15 1805  Charlesetta Shanks, MD 04/09/15 [phone]

## 2015-04-08 NOTE — Discharge Instructions (Signed)
Your labs today were normal. We were able to control your nausea and vomiting. I will give you a prescription for compazine. Please follow up with your primary care provider and GI doctor within one week. Return to the ER for new or worsening symptoms.

## 2015-04-08 NOTE — ED Notes (Signed)
Pt is type 1 diabetic, here with n/v/d x 5 days. Has hx of c.diff 1 year ago but has continued with diarrhea. Also has hx of uti.

## 2015-04-09 LAB — URINE CULTURE

## 2015-04-16 ENCOUNTER — Telehealth: Payer: Self-pay | Admitting: Internal Medicine

## 2015-04-16 NOTE — Telephone Encounter (Signed)
Spoke to patient regarding symptoms and explained she would need an appointment.  Patient asked if MD would simply prescribe something based on her description of her symptoms.  RN explained she would need an appointment in order for MD to prescribe medications.  Transferred to scheduling for appointment.

## 2015-04-16 NOTE — Telephone Encounter (Signed)
Patient is experiencing lower abdominal pain, fever, burning sensation when urinating.... Asked patient if they wanted a to schedule an appointment Asked for nurse to please advised

## 2015-04-18 ENCOUNTER — Emergency Department (HOSPITAL_COMMUNITY)
Admission: EM | Admit: 2015-04-18 | Discharge: 2015-04-19 | Disposition: A | Discharge status: Home / Self Care | Payer: Medicaid Other | Attending: Emergency Medicine | Admitting: Emergency Medicine

## 2015-04-18 ENCOUNTER — Encounter (HOSPITAL_COMMUNITY): Payer: Self-pay

## 2015-04-18 DIAGNOSIS — G8929 Other chronic pain: Secondary | ICD-10-CM | POA: Insufficient documentation

## 2015-04-18 DIAGNOSIS — Z79899 Other long term (current) drug therapy: Secondary | ICD-10-CM | POA: Insufficient documentation

## 2015-04-18 DIAGNOSIS — Z87891 Personal history of nicotine dependence: Secondary | ICD-10-CM | POA: Diagnosis not present

## 2015-04-18 DIAGNOSIS — R109 Unspecified abdominal pain: Secondary | ICD-10-CM | POA: Diagnosis present

## 2015-04-18 DIAGNOSIS — E109 Type 1 diabetes mellitus without complications: Secondary | ICD-10-CM | POA: Insufficient documentation

## 2015-04-18 DIAGNOSIS — Z794 Long term (current) use of insulin: Secondary | ICD-10-CM | POA: Insufficient documentation

## 2015-04-18 DIAGNOSIS — N3090 Cystitis, unspecified without hematuria: Secondary | ICD-10-CM | POA: Insufficient documentation

## 2015-04-18 DIAGNOSIS — Z91040 Latex allergy status: Secondary | ICD-10-CM | POA: Diagnosis not present

## 2015-04-18 LAB — URINE MICROSCOPIC-ADD ON

## 2015-04-18 LAB — URINALYSIS, ROUTINE W REFLEX MICROSCOPIC
BILIRUBIN URINE: NEGATIVE
Glucose, UA: >1000 mg/dL — AB
Ketones, ur: NEGATIVE mg/dL
NITRITE: POSITIVE — AB
PH: 6.5 (ref 5.0–8.0)
Protein, ur: NEGATIVE mg/dL
SPECIFIC GRAVITY, URINE: 1.041 — AB (ref 1.005–1.030)

## 2015-04-18 LAB — COMPREHENSIVE METABOLIC PANEL
ALBUMIN: 4.3 g/dL (ref 3.5–5.0)
ALT: 26 U/L (ref 14–54)
ANION GAP: 12 (ref 5–15)
AST: 35 U/L (ref 15–41)
Alkaline Phosphatase: 83 U/L (ref 38–126)
BUN: 10 mg/dL (ref 6–20)
CO2: 27 mmol/L (ref 22–32)
Calcium: 9.2 mg/dL (ref 8.9–10.3)
Chloride: 90 mmol/L — ABNORMAL LOW (ref 101–111)
Creatinine, Ser: 0.8 mg/dL (ref 0.44–1.00)
GFR calc Af Amer: >60 mL/min (ref >60)
GFR calc non Af Amer: >60 mL/min (ref >60)
GLUCOSE: 538 mg/dL — AB (ref 65–99)
POTASSIUM: 4.3 mmol/L (ref 3.5–5.1)
SODIUM: 129 mmol/L — AB (ref 135–145)
Total Bilirubin: 0.3 mg/dL (ref 0.3–1.2)
Total Protein: 7.9 g/dL (ref 6.5–8.1)

## 2015-04-18 LAB — CBC
HEMATOCRIT: 38.2 % (ref 36.0–46.0)
HEMOGLOBIN: 12.5 g/dL (ref 12.0–15.0)
MCH: 27.8 pg (ref 26.0–34.0)
MCHC: 32.7 g/dL (ref 30.0–36.0)
MCV: 84.9 fL (ref 78.0–100.0)
Platelets: 255 10*3/uL (ref 150–400)
RBC: 4.5 MIL/uL (ref 3.87–5.11)
RDW: 13.4 % (ref 11.5–15.5)
WBC: 4.2 10*3/uL (ref 4.0–10.5)

## 2015-04-18 LAB — CBG MONITORING, ED: Glucose-Capillary: 511 mg/dL — ABNORMAL HIGH (ref 65–99)

## 2015-04-18 LAB — LIPASE, BLOOD: LIPASE: 15 U/L (ref 11–51)

## 2015-04-18 MED ORDER — LACTATED RINGERS IV BOLUS (SEPSIS): 1000.0000 mL | Freq: Once | INTRAVENOUS | Status: DC

## 2015-04-18 MED ORDER — HYDROMORPHONE HCL 1 MG/ML IJ SOLN
1.0000 mg | Freq: Once | INTRAMUSCULAR | Status: AC
  Administered 2015-04-18: 1 mg via INTRAVENOUS
  Filled 2015-04-18: qty 1

## 2015-04-18 MED ORDER — SODIUM CHLORIDE 0.9 % IV BOLUS (SEPSIS): 1000.0000 mL | Freq: Once | INTRAVENOUS | Status: DC

## 2015-04-18 MED ORDER — ONDANSETRON HCL 4 MG/2ML IJ SOLN
4.0000 mg | Freq: Once | INTRAMUSCULAR | Status: AC
  Administered 2015-04-18: 4 mg via INTRAVENOUS
  Filled 2015-04-18: qty 2

## 2015-04-18 MED ORDER — LACTATED RINGERS IV BOLUS (SEPSIS)
1000.0000 mL | Freq: Once | INTRAVENOUS | Status: AC
  Administered 2015-04-18: 1000 mL via INTRAVENOUS

## 2015-04-18 NOTE — ED Provider Notes (Addendum)
CSN: [id number]     Arrival date & time 04/18/15  1712 History   First MD Initiated Contact with Patient 04/18/15 1920     Chief Complaint  Patient presents with  . Abdominal Pain  . Emesis     (Consider location/radiation/quality/duration/timing/severity/associated sxs/prior Treatment) HPI Comments: 35 y.o. female with PMHx of T1DM, chronic abdominal pain/diarrhea, and c. diff (approximately one year ago) who presents to the Emergency Department complaining of dysuria, low grade fever, nausea, emesis, abd pain, back pain. Pt started getting sick 3 days ago. Pain is stabbing in her abd and it radiates to the back. The pain is constant. Pt has no aggravating or relieving factor. She ran out of oxycodone 10 mg. Pt has pain with urination, no hematuria seen, and mild increased frequency. Pt also has emesis. Emesis x 5, usually food. Pt has malaise with myalgias and subjective fevers.  ROS 10 Systems reviewed and are negative for acute change except as noted in the HPI.     Patient is a 35 y.o. female presenting with abdominal pain and vomiting. The history is provided by the patient.  Abdominal Pain Associated symptoms: vomiting   Emesis Associated symptoms: abdominal pain     Past Medical History  Diagnosis Date  . Diabetes mellitus without complication [facility])    Past Surgical History  Procedure Laterality Date  . Laparoscopy  02/2002, 06/2010    laparoscopy with lysis pelvic adhesions for pelvic pain  . Back surgery      age 63  . Tooth extraction    . Root canal  10/10/12  . Flexible sigmoidoscopy N/A 06/24/2014    Procedure: FLEXIBLE SIGMOIDOSCOPY;  Surgeon: Milus Banister, MD;  Location: [facility];  Service: Endoscopy;  Laterality: N/A;  . Esophagogastroduodenoscopy (egd) with propofol N/A 06/27/2014    Procedure: ESOPHAGOGASTRODUODENOSCOPY (EGD) WITH PROPOFOL;  Surgeon: Milus Banister, MD;  Location: [facility];  Service: Endoscopy;  Laterality: N/A;   Family History   Problem Relation Age of Onset  . Diabetes Maternal Grandmother   . Cancer Maternal Grandmother     Leukemia  . Cancer Maternal Grandfather     Small cell lung cancer   Social History  Substance Use Topics  . Smoking status: Former Smoker    Quit date: 04/19/2000  . Smokeless tobacco: Former Systems developer  . Alcohol Use: No     Comment: RARE SIPS OF WINE   OB History    No data available     Review of Systems  Gastrointestinal: Positive for vomiting and abdominal pain.      Allergies  Scallops; Diphenhydramine hcl; Haloperidol and related; Ketorolac; Morphine and related; Toradol; Latex; and Other  Home Medications   Prior to Admission medications   Medication Sig Start Date End Date Taking? Authorizing Provider  acetaminophen (TYLENOL) 500 MG tablet Take 1,000 mg by mouth every 6 (six) hours as needed for moderate pain.    Yes Historical Provider, MD  acetaminophen-codeine (TYLENOL #3) 300-30 MG tablet Take 1 tablet by mouth every 12 (twelve) hours as needed for moderate pain. 04/01/15  Yes Arnoldo Morale, MD  ALPRAZolam (XANAX) 0.5 MG tablet Take 1 tablet (0.5 mg total) by mouth 2 (two) times daily as needed for anxiety. For 2 weeks then 0.5mg  dailyprn Patient taking differently: Take 1 mg by mouth 2 (two) times daily.  04/01/15  Yes Arnoldo Morale, MD  amitriptyline (ELAVIL) 50 MG tablet Take 2 tablets (100 mg total) by mouth at bedtime. 08/07/14  Yes Enobong Amao,  MD  CALCIUM PO Take 2 tablets by mouth daily.   Yes Historical Provider, MD  cholestyramine Lucrezia Starch) 4 G packet Take 1 packet (4 g total) by mouth 2 (two) times daily. Patient taking differently: Take 4 g by mouth 4 (four) times daily.  09/19/14  Yes Jeffrey Hedges, PA-C  CREON 36000 UNITS CPEP capsule Take 4-6 capsules by mouth. Takes 6 capsules with every meal and 4 capsules with every snack. 12/02/14  Yes Historical Provider, MD  cyanocobalamin 500 MCG tablet Take 2,000 mcg by mouth 2 (two) times daily as needed (mouth  sores).    Yes Historical Provider, MD  dicyclomine (BENTYL) 10 MG capsule Take 10 mg by mouth 4 (four) times daily -  before meals and at bedtime. Reported on 04/01/2015   Yes Historical Provider, MD  diphenoxylate-atropine (LOMOTIL) 2.5-0.025 MG per tablet Take 1 tablet by mouth 4 (four) times daily as needed for diarrhea or loose stools. 11/08/14  Yes Orpah Greek, MD  Eluxadoline (VIBERZI) 100 MG TABS Take 100 mg by mouth 2 (two) times daily. Reported on 04/01/2015   Yes Historical Provider, MD  feeding supplement, GLUCERNA SHAKE, (GLUCERNA SHAKE) LIQD Take 237 mLs by mouth 3 (three) times daily between meals. Patient taking differently: Take 237 mLs by mouth 4 (four) times daily.  07/06/14  Yes Thurnell Lose, MD  gabapentin (NEURONTIN) 100 MG capsule Take 300 mg by mouth at bedtime.    Yes Historical Provider, MD  glucagon (GLUCAGON EMERGENCY) 1 MG injection Inject 1 mg into the muscle once as needed (severe hypoglycemia). 08/14/14  Yes Arnoldo Morale, MD  insulin aspart (NOVOLOG FLEXPEN) 100 UNIT/ML FlexPen Inject 0-5 Units into the skin 3 (three) times daily as needed for high blood sugar.    Yes Historical Provider, MD  insulin glargine (LANTUS) 100 UNIT/ML injection Inject 0.07-0.08 mLs (7-8 Units total) into the skin 2 (two) times daily. 8 units in the morning and 7 units at night Patient taking differently: Inject 7-9 Units into the skin 2 (two) times daily. 9 units in the morning and 7 units at night 01/15/15  Yes Velvet Bathe, MD  Multiple Vitamin (MULTIVITAMIN WITH MINERALS) TABS Take 2 tablets by mouth daily. Diabetic support vitamin   Yes Historical Provider, MD  omeprazole (PRILOSEC) 40 MG capsule Take 40 mg by mouth daily as needed (indigestion).    Yes Historical Provider, MD  Potassium (POTASSIMIN PO) Take 2 capsules by mouth daily.   Yes Historical Provider, MD  Probiotic Product (DIGESTIVE ADVANTAGE GUMMIES PO) Take 2 tablets by mouth 2 (two) times daily.   Yes Historical  Provider, MD  promethazine (PHENERGAN) 25 MG suppository Place 1 suppository (25 mg total) rectally every 6 (six) hours as needed for nausea or vomiting. 03/27/15  Yes Nicole Pisciotta, PA-C  promethazine (PHENERGAN) 25 MG tablet Take 1 tablet (25 mg total) by mouth every 6 (six) hours as needed for nausea or vomiting. 03/27/15  Yes Nicole Pisciotta, PA-C  saccharomyces boulardii (FLORASTOR) 250 MG capsule Take 1 capsule (250 mg total) by mouth 2 (two) times daily. 07/06/14  Yes Thurnell Lose, MD  traZODone (DESYREL) 50 MG tablet Take 1 tablet (50 mg total) by mouth at bedtime as needed for sleep. 04/01/15  Yes Arnoldo Morale, MD  prochlorperazine (COMPAZINE) 10 MG tablet Take 1 tablet (10 mg total) by mouth 2 (two) times daily as needed for nausea or vomiting. Patient not taking: Reported on 04/18/2015 04/08/15   Olivia Canter Sam, PA-C   BP  96/78 mmHg  Pulse 90  Temp(Src) 98.6 F (37 C) (Oral)  Resp 11  SpO2 100% Physical Exam  Constitutional: She is oriented to person, place, and time. She appears well-developed.  HENT:  Head: Normocephalic and atraumatic.  MMM  Eyes: Conjunctivae and EOM are normal. Pupils are equal, round, and reactive to light.  Neck: Normal range of motion. Neck supple.  Cardiovascular: Normal rate, regular rhythm and normal heart sounds.   Pulmonary/Chest: Effort normal and breath sounds normal. No respiratory distress.  Abdominal: Soft. Bowel sounds are normal. She exhibits no distension. There is tenderness. There is no rebound and no guarding.  Generalized tenderness  Neurological: She is alert and oriented to person, place, and time.  Skin: Skin is warm and dry.  Nursing note and vitals reviewed.   ED Course  Procedures (including critical care time) Labs Review Labs Reviewed  COMPREHENSIVE METABOLIC PANEL - Abnormal; Notable for the following:    Sodium 129 (*)    Chloride 90 (*)    Glucose, Bld 538 (*)    All other components within normal limits  URINALYSIS,  ROUTINE W REFLEX MICROSCOPIC (NOT AT [facility]) - Abnormal; Notable for the following:    Color, Urine ORANGE (*)    APPearance CLOUDY (*)    Specific Gravity, Urine 1.041 (*)    Glucose, UA >1000 (*)    Hgb urine dipstick TRACE (*)    Nitrite POSITIVE (*)    Leukocytes, UA MODERATE (*)    All other components within normal limits  URINE MICROSCOPIC-ADD ON - Abnormal; Notable for the following:    Squamous Epithelial / LPF 0-5 (*)    Bacteria, UA MANY (*)    All other components within normal limits  CBG MONITORING, ED - Abnormal; Notable for the following:    Glucose-Capillary 511 (*)    All other components within normal limits  URINE CULTURE  LIPASE, BLOOD  CBC    Imaging Review No results found. I have personally reviewed and evaluated these images and lab results as part of my medical decision-making.   EKG Interpretation None      MDM   Final diagnoses:  Cystitis  Chronic pain   PT comes in with cc of abd pain. She has chronic abd pain. Hx of iddm. She has abd pain w/o peritoneal signs. We will not get CT. She has some uti like symptom, and the UA is +, we will tx it. Pt has hyperglycemia, no DKA. Will give fluids. PO challenge passed, no emesis here and the labs dont show profound dehydration.    02/2015 IMPRESSION: 1. Distention of the stomach and proximal duodenum, likely responsible for the patient's nausea and vomiting. 2. The colon is significantly improved in appearance in the interval. The previously seen pneumatosis has almost completely resolved. There is no colonic wall thickening or pericolonic stranding.    Varney Biles, MD 04/19/15 [id number]  Varney Biles, MD 05/19/15 [id number]

## 2015-04-18 NOTE — ED Notes (Signed)
Pt here with n/v abdominal pain and burning with urination x 4 days.  States possible UTI.  Hx DM type I.

## 2015-04-18 NOTE — Progress Notes (Signed)
Patient noted to have been seen in the ED 13 times within the last six months with 2 admissions.  Per chart review, patient has established care at the [facility] and was seen by Dr. Jarold Song on 02/07.  Patient with multiple ED visits for complaints of abdominal pain n/v.  Patient Medicaid insurance.  Patient presents to the Ed this evening with urinary symptoms.

## 2015-04-18 NOTE — ED Notes (Signed)
Bed: [facility] Expected date:  Expected time:  Means of arrival:  Comments: Pt in room 

## 2015-04-19 MED ORDER — OXYCODONE HCL ER 10 MG PO T12A
10.0000 mg | EXTENDED_RELEASE_TABLET | Freq: Two times a day (BID) | ORAL | Status: DC
  Administered 2015-04-19: 10 mg via ORAL
  Filled 2015-04-19 (×2): qty 1

## 2015-04-19 MED ORDER — CEPHALEXIN 500 MG PO CAPS: 500.0000 mg | ORAL_CAPSULE | Freq: Three times a day (TID) | ORAL | Status: DC

## 2015-04-19 MED ORDER — PROCHLORPERAZINE MALEATE 10 MG PO TABS: 10.0000 mg | ORAL_TABLET | Freq: Two times a day (BID) | ORAL | Status: DC | PRN

## 2015-04-19 MED ORDER — PROCHLORPERAZINE EDISYLATE 5 MG/ML IJ SOLN
10.0000 mg | Freq: Four times a day (QID) | INTRAMUSCULAR | Status: DC | PRN
  Administered 2015-04-19: 10 mg via INTRAVENOUS
  Filled 2015-04-19: qty 2

## 2015-04-19 MED ORDER — DEXTROSE 5 % IV SOLN
1.0000 g | Freq: Once | INTRAVENOUS | Status: AC
  Administered 2015-04-19: 1 g via INTRAVENOUS
  Filled 2015-04-19: qty 10

## 2015-04-19 NOTE — Discharge Instructions (Signed)
Hyperglycemia °Hyperglycemia occurs when the glucose (sugar) in your blood is too high. Hyperglycemia can happen for many reasons, but it most often happens to people who do not know they have diabetes or are not managing their diabetes properly.  °CAUSES  °Whether you have diabetes or not, there are other causes of hyperglycemia. Hyperglycemia can occur when you have diabetes, but it can also occur in other situations that you might not be as aware of, such as: °Diabetes °· If you have diabetes and are having problems controlling your blood glucose, hyperglycemia could occur because of some of the following reasons: °¨ Not following your meal plan. °¨ Not taking your diabetes medications or not taking it properly. °¨ Exercising less or doing less activity than you normally do. °¨ Being sick. °Pre-diabetes °· This cannot be ignored. Before people develop Type 2 diabetes, they almost always have "pre-diabetes." This is when your blood glucose levels are higher than normal, but not yet high enough to be diagnosed as diabetes. Research has shown that some long-term damage to the body, especially the heart and circulatory system, may already be occurring during pre-diabetes. If you take action to manage your blood glucose when you have pre-diabetes, you may delay or prevent Type 2 diabetes from developing. °Stress °· If you have diabetes, you may be "diet" controlled or on oral medications or insulin to control your diabetes. However, you may find that your blood glucose is higher than usual in the hospital whether you have diabetes or not. This is often referred to as "stress hyperglycemia." Stress can elevate your blood glucose. This happens because of hormones put out by the body during times of stress. If stress has been the cause of your high blood glucose, it can be followed regularly by your caregiver. That way he/she can make sure your hyperglycemia does not continue to get worse or progress to  diabetes. °Steroids °· Steroids are medications that act on the infection fighting system (immune system) to block inflammation or infection. One side effect can be a rise in blood glucose. Most people can produce enough extra insulin to allow for this rise, but for those who cannot, steroids make blood glucose levels go even higher. It is not unusual for steroid treatments to "uncover" diabetes that is developing. It is not always possible to determine if the hyperglycemia will go away after the steroids are stopped. A special blood test called an A1c is sometimes done to determine if your blood glucose was elevated before the steroids were started. °SYMPTOMS °· Thirsty. °· Frequent urination. °· Dry mouth. °· Blurred vision. °· Tired or fatigue. °· Weakness. °· Sleepy. °· Tingling in feet or leg. °DIAGNOSIS  °Diagnosis is made by monitoring blood glucose in one or all of the following ways: °· A1c test. This is a chemical found in your blood. °· Fingerstick blood glucose monitoring. °· Laboratory results. °TREATMENT  °First, knowing the cause of the hyperglycemia is important before the hyperglycemia can be treated. Treatment may include, but is not be limited to: °· Education. °· Change or adjustment in medications. °· Change or adjustment in meal plan. °· Treatment for an illness, infection, etc. °· More frequent blood glucose monitoring. °· Change in exercise plan. °· Decreasing or stopping steroids. °· Lifestyle changes. °HOME CARE INSTRUCTIONS  °· Test your blood glucose as directed. °· Exercise regularly. Your caregiver will give you instructions about exercise. Pre-diabetes or diabetes which comes on with stress is helped by exercising. °· Eat wholesome,   balanced meals. Eat often and at regular, fixed times. Your caregiver or nutritionist will give you a meal plan to guide your sugar intake.  Being at an ideal weight is important. If needed, losing as little as 10 to 15 pounds may help improve blood  glucose levels. SEEK MEDICAL CARE IF:   You have questions about medicine, activity, or diet.  You continue to have symptoms (problems such as increased thirst, urination, or weight gain). SEEK IMMEDIATE MEDICAL CARE IF:   You are vomiting or have diarrhea.  Your breath smells fruity.  You are breathing faster or slower.  You are very sleepy or incoherent.  You have numbness, tingling, or pain in your feet or hands.  You have chest pain.  Your symptoms get worse even though you have been following your caregiver's orders.  If you have any other questions or concerns.   This information is not intended to replace advice given to you by your health care provider. Make sure you discuss any questions you have with your health care provider.   Document Released: 08/04/2000 Document Revised: 05/03/2011 Document Reviewed: 10/15/2014 Elsevier Interactive Patient Education 2016 Elsevier Inc.   Urinary Tract Infection Urinary tract infections (UTIs) can develop anywhere along your urinary tract. Your urinary tract is your body's drainage system for removing wastes and extra water. Your urinary tract includes two kidneys, two ureters, a bladder, and a urethra. Your kidneys are a pair of bean-shaped organs. Each kidney is about the size of your fist. They are located below your ribs, one on each side of your spine. CAUSES Infections are caused by microbes, which are microscopic organisms, including fungi, viruses, and bacteria. These organisms are so small that they can only be seen through a microscope. Bacteria are the microbes that most commonly cause UTIs. SYMPTOMS  Symptoms of UTIs may vary by age and gender of the patient and by the location of the infection. Symptoms in young women typically include a frequent and intense urge to urinate and a painful, burning feeling in the bladder or urethra during urination. Older women and men are more likely to be tired, shaky, and weak and have  muscle aches and abdominal pain. A fever may mean the infection is in your kidneys. Other symptoms of a kidney infection include pain in your back or sides below the ribs, nausea, and vomiting. DIAGNOSIS To diagnose a UTI, your caregiver will ask you about your symptoms. Your caregiver will also ask you to provide a urine sample. The urine sample will be tested for bacteria and white blood cells. White blood cells are made by your body to help fight infection. TREATMENT  Typically, UTIs can be treated with medication. Because most UTIs are caused by a bacterial infection, they usually can be treated with the use of antibiotics. The choice of antibiotic and length of treatment depend on your symptoms and the type of bacteria causing your infection. HOME CARE INSTRUCTIONS  If you were prescribed antibiotics, take them exactly as your caregiver instructs you. Finish the medication even if you feel better after you have only taken some of the medication.  Drink enough water and fluids to keep your urine clear or pale yellow.  Avoid caffeine, tea, and carbonated beverages. They tend to irritate your bladder.  Empty your bladder often. Avoid holding urine for long periods of time.  Empty your bladder before and after sexual intercourse.  After a bowel movement, women should cleanse from front to back. Use each tissue  only once. SEEK MEDICAL CARE IF:   You have back pain.  You develop a fever.  Your symptoms do not begin to resolve within 3 days. SEEK IMMEDIATE MEDICAL CARE IF:   You have severe back pain or lower abdominal pain.  You develop chills.  You have nausea or vomiting.  You have continued burning or discomfort with urination. MAKE SURE YOU:   Understand these instructions.  Will watch your condition.  Will get help right away if you are not doing well or get worse.   This information is not intended to replace advice given to you by your health care provider. Make sure  you discuss any questions you have with your health care provider.   Document Released: 11/18/2004 Document Revised: 10/30/2014 Document Reviewed: 03/19/2011 Elsevier Interactive Patient Education [employer].

## 2015-04-19 NOTE — ED Notes (Signed)
Pt wants to wait until d/c to receive oxycodone dose.

## 2015-04-21 ENCOUNTER — Telehealth: Payer: Self-pay | Admitting: Family Medicine

## 2015-04-21 DIAGNOSIS — K3184 Gastroparesis: Principal | ICD-10-CM

## 2015-04-21 DIAGNOSIS — E1043 Type 1 diabetes mellitus with diabetic autonomic (poly)neuropathy: Secondary | ICD-10-CM

## 2015-04-21 LAB — URINE CULTURE
Culture: >=100000
SPECIAL REQUESTS: NORMAL

## 2015-04-21 NOTE — Telephone Encounter (Signed)
Patient would like to know if she can get a refill for prochlorperazine (COMPAZINE) 10 MG tablet .......it helps with nausea....please follow up with patient

## 2015-04-21 NOTE — Telephone Encounter (Signed)
Patient asking for refill on a medication prescribed in hospital.  Routing to MD for consideration.

## 2015-04-22 ENCOUNTER — Telehealth (HOSPITAL_BASED_OUTPATIENT_CLINIC_OR_DEPARTMENT_OTHER): Payer: Self-pay | Admitting: Emergency Medicine

## 2015-04-22 MED ORDER — PROCHLORPERAZINE MALEATE 10 MG PO TABS: 10.0000 mg | ORAL_TABLET | Freq: Two times a day (BID) | ORAL | Status: DC | PRN

## 2015-04-22 NOTE — Telephone Encounter (Signed)
Post ED Visit - Positive Culture Follow-up  Culture report reviewed by antimicrobial stewardship pharmacist:  []  Elenor Quinones, Pharm.D. []  Heide Guile, Pharm.D., BCPS [x]  Parks Neptune, Pharm.D. []  Alycia Rossetti, Pharm.D., BCPS []  [place], Pharm.D., BCPS, AAHIVP []  Legrand Como, Pharm.D., BCPS, AAHIVP []  Milus Glazier, Pharm.D. []  Stephens November, [place].D.  Positive urine culture Klebsiella Treated with cephalexin, organism sensitive to the same and no further patient follow-up is required at this time.  Hazle Nordmann 04/22/2015, 9:31 AM

## 2015-04-22 NOTE — Telephone Encounter (Signed)
Refill sent to pharmacy.   

## 2015-04-23 NOTE — Telephone Encounter (Signed)
Patient aware medication was refilled

## 2015-04-28 ENCOUNTER — Telehealth: Payer: Self-pay | Admitting: Family Medicine

## 2015-04-28 DIAGNOSIS — R1084 Generalized abdominal pain: Secondary | ICD-10-CM

## 2015-04-28 NOTE — Telephone Encounter (Signed)
Placed referral to pain clinic-patient called and alerted

## 2015-04-28 NOTE — Telephone Encounter (Signed)
Pt. Called requesting a referral for pain management. Please f/u with pt.

## 2015-04-29 ENCOUNTER — Telehealth: Payer: Self-pay | Admitting: Family Medicine

## 2015-04-29 DIAGNOSIS — R1084 Generalized abdominal pain: Secondary | ICD-10-CM

## 2015-04-29 NOTE — Telephone Encounter (Signed)
Patient is requesting a prescription refill for tylenol 3 please follow up

## 2015-04-30 MED ORDER — ACETAMINOPHEN-CODEINE #3 300-30 MG PO TABS: 1.0000 | ORAL_TABLET | Freq: Two times a day (BID) | ORAL | Status: DC | PRN

## 2015-04-30 NOTE — Telephone Encounter (Signed)
Patient aware med Rx is at front desk to pick up

## 2015-05-03 ENCOUNTER — Inpatient Hospital Stay (HOSPITAL_COMMUNITY)
Admission: EM | Admit: 2015-05-03 | Discharge: 2015-05-05 | DRG: 074 | Disposition: A | Discharge status: Home / Self Care | Payer: Medicaid Other | Attending: Internal Medicine | Admitting: Internal Medicine

## 2015-05-03 ENCOUNTER — Encounter (HOSPITAL_COMMUNITY): Payer: Self-pay

## 2015-05-03 DIAGNOSIS — E860 Dehydration: Secondary | ICD-10-CM | POA: Diagnosis present

## 2015-05-03 DIAGNOSIS — E870 Hyperosmolality and hypernatremia: Secondary | ICD-10-CM | POA: Diagnosis present

## 2015-05-03 DIAGNOSIS — F4323 Adjustment disorder with mixed anxiety and depressed mood: Secondary | ICD-10-CM | POA: Diagnosis present

## 2015-05-03 DIAGNOSIS — E1043 Type 1 diabetes mellitus with diabetic autonomic (poly)neuropathy: Principal | ICD-10-CM | POA: Diagnosis present

## 2015-05-03 DIAGNOSIS — Z87891 Personal history of nicotine dependence: Secondary | ICD-10-CM

## 2015-05-03 DIAGNOSIS — R945 Abnormal results of liver function studies: Secondary | ICD-10-CM | POA: Diagnosis present

## 2015-05-03 DIAGNOSIS — F419 Anxiety disorder, unspecified: Secondary | ICD-10-CM | POA: Diagnosis present

## 2015-05-03 DIAGNOSIS — Z91040 Latex allergy status: Secondary | ICD-10-CM | POA: Diagnosis not present

## 2015-05-03 DIAGNOSIS — K529 Noninfective gastroenteritis and colitis, unspecified: Secondary | ICD-10-CM | POA: Diagnosis present

## 2015-05-03 DIAGNOSIS — D649 Anemia, unspecified: Secondary | ICD-10-CM | POA: Diagnosis present

## 2015-05-03 DIAGNOSIS — R197 Diarrhea, unspecified: Secondary | ICD-10-CM

## 2015-05-03 DIAGNOSIS — Z833 Family history of diabetes mellitus: Secondary | ICD-10-CM | POA: Diagnosis not present

## 2015-05-03 DIAGNOSIS — Z888 Allergy status to other drugs, medicaments and biological substances status: Secondary | ICD-10-CM

## 2015-05-03 DIAGNOSIS — K3184 Gastroparesis: Secondary | ICD-10-CM | POA: Diagnosis present

## 2015-05-03 DIAGNOSIS — R740 Nonspecific elevation of levels of transaminase and lactic acid dehydrogenase [LDH]: Secondary | ICD-10-CM

## 2015-05-03 DIAGNOSIS — Z91013 Allergy to seafood: Secondary | ICD-10-CM | POA: Diagnosis not present

## 2015-05-03 DIAGNOSIS — Z79899 Other long term (current) drug therapy: Secondary | ICD-10-CM

## 2015-05-03 DIAGNOSIS — R7401 Elevation of levels of liver transaminase levels: Secondary | ICD-10-CM

## 2015-05-03 DIAGNOSIS — E1101 Type 2 diabetes mellitus with hyperosmolarity with coma: Secondary | ICD-10-CM | POA: Diagnosis not present

## 2015-05-03 DIAGNOSIS — R739 Hyperglycemia, unspecified: Secondary | ICD-10-CM

## 2015-05-03 DIAGNOSIS — K8681 Exocrine pancreatic insufficiency: Secondary | ICD-10-CM | POA: Diagnosis present

## 2015-05-03 DIAGNOSIS — E1065 Type 1 diabetes mellitus with hyperglycemia: Secondary | ICD-10-CM | POA: Diagnosis present

## 2015-05-03 DIAGNOSIS — R112 Nausea with vomiting, unspecified: Secondary | ICD-10-CM

## 2015-05-03 DIAGNOSIS — F329 Major depressive disorder, single episode, unspecified: Secondary | ICD-10-CM | POA: Diagnosis present

## 2015-05-03 DIAGNOSIS — Z794 Long term (current) use of insulin: Secondary | ICD-10-CM | POA: Diagnosis not present

## 2015-05-03 DIAGNOSIS — R109 Unspecified abdominal pain: Secondary | ICD-10-CM | POA: Diagnosis present

## 2015-05-03 LAB — BLOOD GAS, VENOUS
ACID-BASE DEFICIT: 2.9 mmol/L — AB (ref 0.0–2.0)
BICARBONATE: 21.8 meq/L (ref 20.0–24.0)
O2 SAT: 76.5 %
PO2 VEN: 46.5 mmHg — AB (ref 31.0–45.0)
Patient temperature: 98.6
TCO2: 20.5 mmol/L (ref 0–100)
pCO2, Ven: 40.2 mmHg — ABNORMAL LOW (ref 45.0–50.0)
pH, Ven: 7.354 — ABNORMAL HIGH (ref 7.250–7.300)

## 2015-05-03 LAB — COMPREHENSIVE METABOLIC PANEL
ALBUMIN: 3.6 g/dL (ref 3.5–5.0)
ALT: 84 U/L — ABNORMAL HIGH (ref 14–54)
AST: 232 U/L — AB (ref 15–41)
Alkaline Phosphatase: 74 U/L (ref 38–126)
Anion gap: 13 (ref 5–15)
BILIRUBIN TOTAL: 0.5 mg/dL (ref 0.3–1.2)
BUN: 16 mg/dL (ref 6–20)
CO2: 22 mmol/L (ref 22–32)
Calcium: 7.9 mg/dL — ABNORMAL LOW (ref 8.9–10.3)
Chloride: 96 mmol/L — ABNORMAL LOW (ref 101–111)
Creatinine, Ser: 0.88 mg/dL (ref 0.44–1.00)
GFR calc Af Amer: >60 mL/min (ref >60)
GFR calc non Af Amer: >60 mL/min (ref >60)
GLUCOSE: 850 mg/dL — AB (ref 65–99)
POTASSIUM: 4.2 mmol/L (ref 3.5–5.1)
Sodium: 131 mmol/L — ABNORMAL LOW (ref 135–145)
TOTAL PROTEIN: 6.3 g/dL — AB (ref 6.5–8.1)

## 2015-05-03 LAB — URINE MICROSCOPIC-ADD ON
BACTERIA UA: NONE SEEN
RBC / HPF: NONE SEEN RBC/hpf (ref 0–5)
WBC, UA: NONE SEEN WBC/hpf (ref 0–5)

## 2015-05-03 LAB — CBC WITH DIFFERENTIAL/PLATELET
BASOS ABS: 0 10*3/uL (ref 0.0–0.1)
BASOS PCT: 0 %
Eosinophils Absolute: 0 10*3/uL (ref 0.0–0.7)
Eosinophils Relative: 1 %
HEMATOCRIT: 30.6 % — AB (ref 36.0–46.0)
HEMOGLOBIN: 10.1 g/dL — AB (ref 12.0–15.0)
Lymphocytes Relative: 24 %
Lymphs Abs: 1.5 10*3/uL (ref 0.7–4.0)
MCH: 26.9 pg (ref 26.0–34.0)
MCHC: 33 g/dL (ref 30.0–36.0)
MCV: 81.4 fL (ref 78.0–100.0)
MONO ABS: 0.5 10*3/uL (ref 0.1–1.0)
Monocytes Relative: 8 %
NEUTROS ABS: 4.1 10*3/uL (ref 1.7–7.7)
NEUTROS PCT: 67 %
Platelets: 241 10*3/uL (ref 150–400)
RBC: 3.76 MIL/uL — AB (ref 3.87–5.11)
RDW: 13.8 % (ref 11.5–15.5)
WBC: 6.1 10*3/uL (ref 4.0–10.5)

## 2015-05-03 LAB — BASIC METABOLIC PANEL
Anion gap: 7 (ref 5–15)
BUN: 10 mg/dL (ref 6–20)
CHLORIDE: 108 mmol/L (ref 101–111)
CO2: 22 mmol/L (ref 22–32)
CREATININE: 0.48 mg/dL (ref 0.44–1.00)
Calcium: 8.2 mg/dL — ABNORMAL LOW (ref 8.9–10.3)
GFR calc Af Amer: >60 mL/min (ref >60)
GFR calc non Af Amer: >60 mL/min (ref >60)
GLUCOSE: 83 mg/dL (ref 65–99)
Potassium: 3.2 mmol/L — ABNORMAL LOW (ref 3.5–5.1)
Sodium: 137 mmol/L (ref 135–145)

## 2015-05-03 LAB — CBC
HCT: 27.3 % — ABNORMAL LOW (ref 36.0–46.0)
Hemoglobin: 9.3 g/dL — ABNORMAL LOW (ref 12.0–15.0)
MCH: 27 pg (ref 26.0–34.0)
MCHC: 34.1 g/dL (ref 30.0–36.0)
MCV: 79.4 fL (ref 78.0–100.0)
PLATELETS: 218 10*3/uL (ref 150–400)
RBC: 3.44 MIL/uL — ABNORMAL LOW (ref 3.87–5.11)
RDW: 13.6 % (ref 11.5–15.5)
WBC: 5.3 10*3/uL (ref 4.0–10.5)

## 2015-05-03 LAB — CBG MONITORING, ED
GLUCOSE-CAPILLARY: 483 mg/dL — AB (ref 65–99)
GLUCOSE-CAPILLARY: 310 mg/dL — AB (ref 65–99)

## 2015-05-03 LAB — LIPASE, BLOOD: Lipase: 18 U/L (ref 11–51)

## 2015-05-03 LAB — URINALYSIS, ROUTINE W REFLEX MICROSCOPIC
Bilirubin Urine: NEGATIVE
Hgb urine dipstick: NEGATIVE
KETONES UR: NEGATIVE mg/dL
LEUKOCYTES UA: NEGATIVE
Nitrite: NEGATIVE
PH: 6 (ref 5.0–8.0)
Protein, ur: NEGATIVE mg/dL
Specific Gravity, Urine: 1.031 — ABNORMAL HIGH (ref 1.005–1.030)

## 2015-05-03 LAB — MRSA PCR SCREENING: MRSA by PCR: NEGATIVE

## 2015-05-03 LAB — I-STAT CG4 LACTIC ACID, ED: LACTIC ACID, VENOUS: 4.7 mmol/L — AB (ref 0.5–2.0)

## 2015-05-03 LAB — POC URINE PREG, ED: Preg Test, Ur: NEGATIVE

## 2015-05-03 MED ORDER — SODIUM CHLORIDE 0.9 % IV SOLN
INTRAVENOUS | Status: DC
  Administered 2015-05-03 – 2015-05-04 (×3): via INTRAVENOUS

## 2015-05-03 MED ORDER — HYDROMORPHONE HCL 1 MG/ML IJ SOLN
1.0000 mg | Freq: Once | INTRAMUSCULAR | Status: AC
  Administered 2015-05-03: 1 mg via INTRAVENOUS
  Filled 2015-05-03: qty 1

## 2015-05-03 MED ORDER — DEXTROSE-NACL 5-0.45 % IV SOLN
INTRAVENOUS | Status: DC
Start: 2015-05-03 — End: 2015-05-05

## 2015-05-03 MED ORDER — PROCHLORPERAZINE MALEATE 10 MG PO TABS
10.0000 mg | ORAL_TABLET | Freq: Two times a day (BID) | ORAL | Status: DC | PRN
  Filled 2015-05-03: qty 1

## 2015-05-03 MED ORDER — ACETAMINOPHEN 650 MG RE SUPP: 650.0000 mg | Freq: Four times a day (QID) | RECTAL | Status: DC | PRN

## 2015-05-03 MED ORDER — SODIUM CHLORIDE 0.9 % IV BOLUS (SEPSIS)
1000.0000 mL | Freq: Once | INTRAVENOUS | Status: AC
  Administered 2015-05-03: 1000 mL via INTRAVENOUS

## 2015-05-03 MED ORDER — PROMETHAZINE HCL 25 MG RE SUPP: 25.0000 mg | Freq: Four times a day (QID) | RECTAL | Status: DC | PRN

## 2015-05-03 MED ORDER — ONDANSETRON HCL 4 MG PO TABS: 4.0000 mg | ORAL_TABLET | Freq: Four times a day (QID) | ORAL | Status: DC | PRN

## 2015-05-03 MED ORDER — ONDANSETRON HCL 4 MG/2ML IJ SOLN
4.0000 mg | Freq: Four times a day (QID) | INTRAMUSCULAR | Status: DC | PRN
  Administered 2015-05-04: 4 mg via INTRAVENOUS
  Filled 2015-05-03: qty 2

## 2015-05-03 MED ORDER — AMITRIPTYLINE HCL 100 MG PO TABS
100.0000 mg | ORAL_TABLET | Freq: Every day | ORAL | Status: DC
  Administered 2015-05-04: 100 mg via ORAL
  Filled 2015-05-03 (×2): qty 1
  Filled 2015-05-03: qty 4

## 2015-05-03 MED ORDER — SODIUM CHLORIDE 0.9 % IV SOLN: INTRAVENOUS | Status: DC

## 2015-05-03 MED ORDER — SODIUM CHLORIDE 0.9 % IV SOLN
INTRAVENOUS | Status: DC
  Administered 2015-05-03: 5 [IU]/h via INTRAVENOUS
  Filled 2015-05-03: qty 2.5

## 2015-05-03 MED ORDER — DEXTROSE 50 % IV SOLN
25.0000 mL | INTRAVENOUS | Status: DC | PRN
  Administered 2015-05-03 – 2015-05-04 (×2): 25 mL via INTRAVENOUS
  Filled 2015-05-03 (×2): qty 50

## 2015-05-03 MED ORDER — ACETAMINOPHEN 325 MG PO TABS: 650.0000 mg | ORAL_TABLET | Freq: Four times a day (QID) | ORAL | Status: DC | PRN

## 2015-05-03 MED ORDER — HYDROGEN PEROXIDE 3 % EX SOLN
CUTANEOUS | Status: AC
  Administered 2015-05-03: 19:00:00
  Filled 2015-05-03: qty 473

## 2015-05-03 MED ORDER — MORPHINE SULFATE (PF) 2 MG/ML IV SOLN
1.0000 mg | INTRAVENOUS | Status: DC | PRN
  Filled 2015-05-03: qty 1

## 2015-05-03 MED ORDER — HYDROMORPHONE HCL 1 MG/ML IJ SOLN: 1.0000 mg | INTRAMUSCULAR | Status: DC | PRN

## 2015-05-03 MED ORDER — DEXTROSE-NACL 5-0.45 % IV SOLN
INTRAVENOUS | Status: DC
  Administered 2015-05-03: 23:00:00 via INTRAVENOUS

## 2015-05-03 MED ORDER — INSULIN REGULAR BOLUS VIA INFUSION
0.0000 [IU] | Freq: Three times a day (TID) | INTRAVENOUS | Status: DC
  Filled 2015-05-03: qty 10

## 2015-05-03 MED ORDER — PROMETHAZINE HCL 25 MG/ML IJ SOLN
25.0000 mg | Freq: Once | INTRAMUSCULAR | Status: AC
  Administered 2015-05-03: 25 mg via INTRAVENOUS
  Filled 2015-05-03: qty 1

## 2015-05-03 MED ORDER — INSULIN REGULAR HUMAN 100 UNIT/ML IJ SOLN
INTRAMUSCULAR | Status: DC
  Filled 2015-05-03: qty 2.5

## 2015-05-03 MED ORDER — ENOXAPARIN SODIUM 30 MG/0.3ML ~~LOC~~ SOLN
30.0000 mg | SUBCUTANEOUS | Status: DC
  Administered 2015-05-03 – 2015-05-04 (×2): 30 mg via SUBCUTANEOUS
  Filled 2015-05-03 (×3): qty 0.3

## 2015-05-03 MED ORDER — HYDROMORPHONE HCL 1 MG/ML IJ SOLN
0.5000 mg | INTRAMUSCULAR | Status: DC | PRN
  Administered 2015-05-03 – 2015-05-04 (×5): 0.5 mg via INTRAVENOUS
  Filled 2015-05-03 (×5): qty 1

## 2015-05-03 MED ORDER — ONDANSETRON HCL 4 MG/2ML IJ SOLN
4.0000 mg | Freq: Once | INTRAMUSCULAR | Status: AC
  Administered 2015-05-03: 4 mg via INTRAVENOUS
  Filled 2015-05-03: qty 2

## 2015-05-03 NOTE — ED Notes (Signed)
Bed: WA21 Expected date:  Expected time:  Means of arrival:  Comments: hyperglycemia

## 2015-05-03 NOTE — ED Notes (Signed)
[place], EDP made aware of patient lactic result.

## 2015-05-03 NOTE — H&P (Signed)
[facility] History and Physical  [name] [id number] DOB: [birth year] DOA: 05/03/2015  Referring physician: Dr. Blanchie Dessert, [place] PCP: Arnoldo Morale, MD  Specialists:   Chief Complaint: Hyperglycemia, abdominal pain  HPI: [name] is a 35 y.o. female  With a history of diabetes mellitus, type I on insulin, diabetic gastroparesis, anxiety, presented to the emergency department with complaints of abdominal discomfort, nausea, vomiting, diarrhea for the last several days. Patient states her family including her husband as well as child had been sick with similar complaints for the last 24 hours. She denies any fevers or chills, chest pain or shortness of breath or cough. Patient states that this episode does not feel like DKA which she has had approximately 40 times since 2012. Patient also states her diarrhea is not reminiscent of C. difficile which she is also having the past. She has been unable to tolerate any food or fluids. She states she has been taking her insulin however her sugars have been very high. In the emergency department, patient was found to have blood glucose of 850. Does not appear to be in DKA. Patient started on IV fluids. [facility] called for admission.  Review of Systems:  Constitutional: Denies fever, chills, diaphoresis, appetite change and fatigue.  HEENT: Denies photophobia, eye pain, redness, hearing loss, ear pain, congestion, sore throat, rhinorrhea, sneezing, mouth sores, trouble swallowing, neck pain, neck stiffness and tinnitus.   Respiratory: Denies SOB, DOE, cough, chest tightness,  and wheezing.   Cardiovascular: Denies chest pain, palpitations and leg swelling.  Gastrointestinal: Complains of abdominal pain, nausea and vomiting, diarrhea Genitourinary: Complains of dysuria Musculoskeletal: Denies myalgias, back pain, joint swelling, arthralgias and gait problem.  Skin: Denies pallor, rash and wound.  Neurological: Denies dizziness,  seizures, syncope, weakness, light-headedness, numbness and headaches.  Hematological: Denies adenopathy. Easy bruising, personal or family bleeding history  Psychiatric/Behavioral: Denies suicidal ideation, mood changes, confusion, nervousness, sleep disturbance and agitation  Past Medical History  Diagnosis Date  . Diabetes mellitus without complication ([place])     TYPE I   Past Surgical History  Procedure Laterality Date  . Laparoscopy  02/2002, 06/2010    laparoscopy with lysis pelvic adhesions for pelvic pain  . Back surgery      age 57  . Tooth extraction    . Root canal  10/10/12  . Flexible sigmoidoscopy N/A 06/24/2014    Procedure: FLEXIBLE SIGMOIDOSCOPY;  Surgeon: Milus Banister, MD;  Location: [facility];  Service: Endoscopy;  Laterality: N/A;  . Esophagogastroduodenoscopy (egd) with propofol N/A 06/27/2014    Procedure: ESOPHAGOGASTRODUODENOSCOPY (EGD) WITH PROPOFOL;  Surgeon: Milus Banister, MD;  Location: [facility];  Service: Endoscopy;  Laterality: N/A;   Social History:  reports that she quit smoking about 15 years ago. She has quit using smokeless tobacco. She reports that she does not drink alcohol or use illicit drugs.   Allergies  Allergen Reactions  . Scallops [Shellfish Allergy] Anaphylaxis  . Diphenhydramine Hcl Nausea Only    IV diphenhydramine.  Oral capsules ok.    . Haloperidol And Related Itching    Pt wanted to rip skin off  . Ketorolac Itching  . Morphine And Related Itching  . Toradol [Ketorolac Tromethamine]     Pt wanted to rip skin off  . Latex Rash  . Other Rash    Sunscreen and grass    Family History  Problem Relation Age of Onset  . Diabetes Maternal Grandmother   . Cancer Maternal Grandmother  Leukemia  . Cancer Maternal Grandfather     Small cell lung cancer    Prior to Admission medications   Medication Sig Start Date End Date Taking? Authorizing Provider  acetaminophen (TYLENOL) 500 MG tablet Take 1,000 mg by mouth every 6  (six) hours as needed for moderate pain.    Yes Historical Provider, MD  acetaminophen-codeine (TYLENOL #3) 300-30 MG tablet Take 1 tablet by mouth every 12 (twelve) hours as needed for moderate pain. 04/30/15  Yes Arnoldo Morale, MD  ALPRAZolam Duanne Moron) 1 MG tablet Take 1 mg by mouth 2 (two) times daily as needed for anxiety.   Yes Historical Provider, MD  amitriptyline (ELAVIL) 50 MG tablet Take 2 tablets (100 mg total) by mouth at bedtime. 08/07/14  Yes Arnoldo Morale, MD  CALCIUM PO Take 2 tablets by mouth daily.   Yes Historical Provider, MD  cholestyramine Lucrezia Starch) 4 G packet Take 1 packet (4 g total) by mouth 2 (two) times daily. Patient taking differently: Take 4 g by mouth 4 (four) times daily.  09/19/14  Yes Jeffrey Hedges, PA-C  CREON 36000 UNITS CPEP capsule Take 4-6 capsules by mouth. Takes 6 capsules with every meal and 4 capsules with every snack. 12/02/14  Yes Historical Provider, MD  cyanocobalamin 500 MCG tablet Take 2,000 mcg by mouth 2 (two) times daily as needed (mouth sores).    Yes Historical Provider, MD  dicyclomine (BENTYL) 10 MG capsule Take 10 mg by mouth 4 (four) times daily -  before meals and at bedtime. Reported on 04/01/2015   Yes Historical Provider, MD  diphenoxylate-atropine (LOMOTIL) 2.5-0.025 MG per tablet Take 1 tablet by mouth 4 (four) times daily as needed for diarrhea or loose stools. 11/08/14  Yes Orpah Greek, MD  feeding supplement, GLUCERNA SHAKE, (GLUCERNA SHAKE) LIQD Take 237 mLs by mouth 3 (three) times daily between meals. Patient taking differently: Take 237 mLs by mouth 4 (four) times daily.  07/06/14  Yes Thurnell Lose, MD  gabapentin (NEURONTIN) 100 MG capsule Take 300 mg by mouth at bedtime.    Yes Historical Provider, MD  glucagon (GLUCAGON EMERGENCY) 1 MG injection Inject 1 mg into the muscle once as needed (severe hypoglycemia). 08/14/14  Yes Arnoldo Morale, MD  guaifenesin (ROBITUSSIN) 100 MG/5ML syrup Take 200 mg by mouth 3 (three) times daily  as needed for cough.   Yes Historical Provider, MD  HYDROcodone-acetaminophen (NORCO/VICODIN) 5-325 MG tablet Take 1 tablet by mouth every 4 (four) hours as needed (dental pain).  04/23/15  Yes Historical Provider, MD  insulin aspart (NOVOLOG FLEXPEN) 100 UNIT/ML FlexPen Inject 0-5 Units into the skin 3 (three) times daily as needed for high blood sugar.    Yes Historical Provider, MD  insulin glargine (LANTUS) 100 UNIT/ML injection Inject 0.07-0.08 mLs (7-8 Units total) into the skin 2 (two) times daily. 8 units in the morning and 7 units at night Patient taking differently: Inject 7-9 Units into the skin 2 (two) times daily. 9 units in the morning and 7 units at night 01/15/15  Yes Velvet Bathe, MD  Multiple Vitamin (MULTIVITAMIN WITH MINERALS) TABS Take 2 tablets by mouth daily. Diabetic support vitamin   Yes Historical Provider, MD  omeprazole (PRILOSEC) 40 MG capsule Take 40 mg by mouth daily as needed (indigestion).    Yes Historical Provider, MD  Probiotic Product (DIGESTIVE ADVANTAGE GUMMIES PO) Take 2 tablets by mouth 2 (two) times daily.   Yes Historical Provider, MD  prochlorperazine (COMPAZINE) 10 MG tablet Take 1  tablet (10 mg total) by mouth 2 (two) times daily as needed for nausea or vomiting. 04/22/15  Yes Arnoldo Morale, MD  promethazine (PHENERGAN) 25 MG suppository Place 1 suppository (25 mg total) rectally every 6 (six) hours as needed for nausea or vomiting. 03/27/15  Yes Nicole Pisciotta, PA-C  promethazine (PHENERGAN) 25 MG tablet Take 1 tablet (25 mg total) by mouth every 6 (six) hours as needed for nausea or vomiting. 03/27/15  Yes Nicole Pisciotta, PA-C  saccharomyces boulardii (FLORASTOR) 250 MG capsule Take 1 capsule (250 mg total) by mouth 2 (two) times daily. 07/06/14  Yes Thurnell Lose, MD  traZODone (DESYREL) 50 MG tablet Take 1 tablet (50 mg total) by mouth at bedtime as needed for sleep. 04/01/15  Yes Arnoldo Morale, MD  Eluxadoline (VIBERZI) 100 MG TABS Take 100 mg by mouth 2  (two) times daily. Reported on 04/01/2015    Historical Provider, MD   Physical Exam: Filed Vitals:   05/03/15 1532  BP: 106/75  Pulse: 86  Temp: 97.9 F (36.6 C)  Resp: 16     General: Well developed, well nourished, NAD, appears stated age  HEENT: NCAT, PERRLA, EOMI, Anicteic Sclera, mucous membranes moist. Piercings on the face  Neck: Supple, no JVD, no masses  Cardiovascular: S1 S2 auscultated, no rubs, murmurs or gallops. Regular rate and rhythm.  Respiratory: Clear to auscultation bilaterally with equal chest rise  Abdomen: Soft, nontender, nondistended, + bowel sounds  Extremities: warm dry without cyanosis clubbing or edema  Neuro: AAOx3, cranial nerves grossly intact. Strength 5/5 in patient's upper and lower extremities bilaterally  Skin: Without rashes exudates or nodules, multiple tattoos  Psych: Normal affect and demeanor with intact judgement and insight  Labs on Admission:  Basic Metabolic Panel:  Recent Labs Lab 05/03/15 1641  NA 131*  K 4.2  CL 96*  CO2 22  GLUCOSE 850*  BUN 16  CREATININE 0.88  CALCIUM 7.9*   Liver Function Tests:  Recent Labs Lab 05/03/15 1641  AST 232*  ALT 84*  ALKPHOS 74  BILITOT 0.5  PROT 6.3*  ALBUMIN 3.6    Recent Labs Lab 05/03/15 1641  LIPASE 18   No results for input(s): AMMONIA in the last 168 hours. CBC:  Recent Labs Lab 05/03/15 1641  WBC 6.1  NEUTROABS 4.1  HGB 10.1*  HCT 30.6*  MCV 81.4  PLT 241   Cardiac Enzymes: No results for input(s): CKTOTAL, CKMB, CKMBINDEX, TROPONINI in the last 168 hours.  BNP (last 3 results) No results for input(s): BNP in the last 8760 hours.  ProBNP (last 3 results) No results for input(s): PROBNP in the last 8760 hours.  CBG:  Recent Labs Lab 05/03/15 1537  GLUCAP >600*    Radiological Exams on Admission: No results found.  EKG: None  Assessment/Plan  Hyperosmolar nonketotic state in Diabetes mellitus, type I -Admit patient to  stepdown -Admission blood glucose 850, anion gap 13, UA negative for ketones -Will start patient on glucose stabilizes her -Monitor BMP every 2 hours and CBGs per DKA protocol -Continue IV fluids per protocol -Likely secondary to some type of gastroenteritis -Last hemoglobin A1c 13.2 (03/12/2015)  Elevated LFTs -Likely secondary to dehydration from GI losses -Continue IVF -Will continue to monitor CMP  Diabetic gastroparesis associated with type I Diabetes mellitus -Patient will be nothing by mouth at this time, will reassess once patient's sugars are more controlled  Nausea, vomiting, diarrhea -Possibly due to viral etiology, patient currently has no white count and  is afebrile -Patient is on multiple medications including   Exocrine pancreatic insufficiency -Creon held due to NPO status  Depression/anxiety -Continue Elavil and Xanax  Pseudohyponatremia -Likely secondary to hyperglycemia, continue to monitor BMP  Lactic acidosis -Likely secondary to the HONK and dehydration -Will continue to monitor and give IV fluids  Normocytic anemia -Hemoglobin currently 10.1, continue monitor CBC -Suspect hemoglobin will drop due to IV fluids  DVT prophylaxis: lovenox  Code Status: Full  Condition: Guarded  Family Communication: Not bedside. Admission, patients condition and plan of care including tests being ordered have been discussed with the patient, who indicates understanding and agrees with the plan and Code Status.  Disposition Plan: Admitted to stepdown.  Time spent: 65 minutes  Azhar Knope D.O. [facility] Pager [phone]  If 7PM-7AM, please contact night-coverage [email or web address] Password [facility] 05/03/2015, 6:13 PM

## 2015-05-03 NOTE — ED Provider Notes (Addendum)
CSN: [id number]     Arrival date & time 05/03/15  1519 History   First MD Initiated Contact with Patient 05/03/15 1547     Chief Complaint  Patient presents with  . Hyperglycemia    PERSONAL METER (HI) EMS METER (HI)  . Abdominal Pain    N/V/D X3 DAYS     (Consider location/radiation/quality/duration/timing/severity/associated sxs/prior Treatment) Patient is a 35 y.o. female presenting with hyperglycemia and abdominal pain. The history is provided by the patient.  Hyperglycemia Blood sugar level PTA:  >600 Severity:  Severe Onset quality:  Gradual Duration:  3 days Timing:  Constant Progression:  Worsening Chronicity:  Recurrent Diabetes status:  Controlled with insulin Current diabetic therapy:  Lantus and insulin Time since last antidiabetic medication:  3 hours Context: recent illness   Relieved by:  Nothing Ineffective treatments:  Insulin Associated symptoms: abdominal pain, dehydration, dysuria, nausea, vomiting and weakness   Associated symptoms comment:  Diarrhea.   Pt states her child and husband had the stomach bug and she has had the same sx over the last 2 1/2 days.  Her sugar has been running high all day despite taking insulin Risk factors: pancreatic disease   Abdominal Pain Associated symptoms: dysuria, nausea and vomiting     Past Medical History  Diagnosis Date  . Diabetes mellitus without complication ([place])     TYPE I   Past Surgical History  Procedure Laterality Date  . Laparoscopy  02/2002, 06/2010    laparoscopy with lysis pelvic adhesions for pelvic pain  . Back surgery      age 26  . Tooth extraction    . Root canal  10/10/12  . Flexible sigmoidoscopy N/A 06/24/2014    Procedure: FLEXIBLE SIGMOIDOSCOPY;  Surgeon: Milus Banister, MD;  Location: [facility];  Service: Endoscopy;  Laterality: N/A;  . Esophagogastroduodenoscopy (egd) with propofol N/A 06/27/2014    Procedure: ESOPHAGOGASTRODUODENOSCOPY (EGD) WITH PROPOFOL;  Surgeon: Milus Banister,  MD;  Location: [facility];  Service: Endoscopy;  Laterality: N/A;   Family History  Problem Relation Age of Onset  . Diabetes Maternal Grandmother   . Cancer Maternal Grandmother     Leukemia  . Cancer Maternal Grandfather     Small cell lung cancer   Social History  Substance Use Topics  . Smoking status: Former Smoker    Quit date: 04/19/2000  . Smokeless tobacco: Former Systems developer  . Alcohol Use: No     Comment: RARE SIPS OF WINE   OB History    No data available     Review of Systems  Gastrointestinal: Positive for nausea, vomiting and abdominal pain.  Genitourinary: Positive for dysuria.  Neurological: Positive for weakness.  All other systems reviewed and are negative.     Allergies  Scallops; Diphenhydramine hcl; Haloperidol and related; Ketorolac; Morphine and related; Toradol; Latex; and Other  Home Medications   Prior to Admission medications   Medication Sig Start Date End Date Taking? Authorizing Provider  acetaminophen (TYLENOL) 500 MG tablet Take 1,000 mg by mouth every 6 (six) hours as needed for moderate pain.     Historical Provider, MD  acetaminophen-codeine (TYLENOL #3) 300-30 MG tablet Take 1 tablet by mouth every 12 (twelve) hours as needed for moderate pain. 04/30/15   Arnoldo Morale, MD  ALPRAZolam Duanne Moron) 0.5 MG tablet Take 1 tablet (0.5 mg total) by mouth 2 (two) times daily as needed for anxiety. For 2 weeks then 0.5mg  dailyprn Patient taking differently: Take 1 mg by mouth 2 (  two) times daily.  04/01/15   Arnoldo Morale, MD  amitriptyline (ELAVIL) 50 MG tablet Take 2 tablets (100 mg total) by mouth at bedtime. 08/07/14   Arnoldo Morale, MD  CALCIUM PO Take 2 tablets by mouth daily.    Historical Provider, MD  cephALEXin (KEFLEX) 500 MG capsule Take 1 capsule (500 mg total) by mouth 3 (three) times daily. 04/19/15   Varney Biles, MD  cholestyramine Lucrezia Starch) 4 G packet Take 1 packet (4 g total) by mouth 2 (two) times daily. Patient taking differently: Take 4  g by mouth 4 (four) times daily.  09/19/14   Jeffrey Hedges, PA-C  CREON 36000 UNITS CPEP capsule Take 4-6 capsules by mouth. Takes 6 capsules with every meal and 4 capsules with every snack. 12/02/14   Historical Provider, MD  cyanocobalamin 500 MCG tablet Take 2,000 mcg by mouth 2 (two) times daily as needed (mouth sores).     Historical Provider, MD  dicyclomine (BENTYL) 10 MG capsule Take 10 mg by mouth 4 (four) times daily -  before meals and at bedtime. Reported on 04/01/2015    Historical Provider, MD  diphenoxylate-atropine (LOMOTIL) 2.5-0.025 MG per tablet Take 1 tablet by mouth 4 (four) times daily as needed for diarrhea or loose stools. 11/08/14   Orpah Greek, MD  Eluxadoline (VIBERZI) 100 MG TABS Take 100 mg by mouth 2 (two) times daily. Reported on 04/01/2015    Historical Provider, MD  feeding supplement, GLUCERNA SHAKE, (GLUCERNA SHAKE) LIQD Take 237 mLs by mouth 3 (three) times daily between meals. Patient taking differently: Take 237 mLs by mouth 4 (four) times daily.  07/06/14   Thurnell Lose, MD  gabapentin (NEURONTIN) 100 MG capsule Take 300 mg by mouth at bedtime.     Historical Provider, MD  glucagon (GLUCAGON EMERGENCY) 1 MG injection Inject 1 mg into the muscle once as needed (severe hypoglycemia). 08/14/14   Arnoldo Morale, MD  insulin aspart (NOVOLOG FLEXPEN) 100 UNIT/ML FlexPen Inject 0-5 Units into the skin 3 (three) times daily as needed for high blood sugar.     Historical Provider, MD  insulin glargine (LANTUS) 100 UNIT/ML injection Inject 0.07-0.08 mLs (7-8 Units total) into the skin 2 (two) times daily. 8 units in the morning and 7 units at night Patient taking differently: Inject 7-9 Units into the skin 2 (two) times daily. 9 units in the morning and 7 units at night 01/15/15   Velvet Bathe, MD  Multiple Vitamin (MULTIVITAMIN WITH MINERALS) TABS Take 2 tablets by mouth daily. Diabetic support vitamin    Historical Provider, MD  omeprazole (PRILOSEC) 40 MG capsule  Take 40 mg by mouth daily as needed (indigestion).     Historical Provider, MD  Potassium (POTASSIMIN PO) Take 2 capsules by mouth daily.    Historical Provider, MD  Probiotic Product (DIGESTIVE ADVANTAGE GUMMIES PO) Take 2 tablets by mouth 2 (two) times daily.    Historical Provider, MD  prochlorperazine (COMPAZINE) 10 MG tablet Take 1 tablet (10 mg total) by mouth 2 (two) times daily as needed for nausea or vomiting. 04/22/15   Arnoldo Morale, MD  promethazine (PHENERGAN) 25 MG suppository Place 1 suppository (25 mg total) rectally every 6 (six) hours as needed for nausea or vomiting. 03/27/15   Nicole Pisciotta, PA-C  promethazine (PHENERGAN) 25 MG tablet Take 1 tablet (25 mg total) by mouth every 6 (six) hours as needed for nausea or vomiting. 03/27/15   Nicole Pisciotta, PA-C  saccharomyces boulardii (FLORASTOR) 250 MG capsule  Take 1 capsule (250 mg total) by mouth 2 (two) times daily. 07/06/14   Thurnell Lose, MD  traZODone (DESYREL) 50 MG tablet Take 1 tablet (50 mg total) by mouth at bedtime as needed for sleep. 04/01/15   Arnoldo Morale, MD   BP 106/75 mmHg  Pulse 86  Temp(Src) 97.9 F (36.6 C)  Resp 16  Ht 5\' 5"  (1.651 m)  Wt 90 lb (40.824 kg)  BMI 14.98 kg/m2  SpO2 100% Physical Exam  Constitutional: She is oriented to person, place, and time. She appears well-developed and well-nourished. No distress.  HENT:  Head: Normocephalic and atraumatic.  Mouth/Throat: Oropharynx is clear and moist. Mucous membranes are dry.  Eyes: Conjunctivae and EOM are normal. Pupils are equal, round, and reactive to light.  Neck: Normal range of motion. Neck supple.  Cardiovascular: Normal rate, regular rhythm and intact distal pulses.   No murmur heard. Pulmonary/Chest: Effort normal and breath sounds normal. No respiratory distress. She has no wheezes. She has no rales.  Abdominal: Soft. She exhibits no distension. There is tenderness. There is no rebound and no guarding.  Diffuse abd pain   Musculoskeletal: Normal range of motion. She exhibits no edema or tenderness.  Neurological: She is alert and oriented to person, place, and time.  Skin: Skin is warm and dry. No rash noted. No erythema.  Psychiatric: She has a normal mood and affect. Her behavior is normal.  Nursing note and vitals reviewed.   ED Course  Procedures (including critical care time) Labs Review Labs Reviewed  CBC WITH DIFFERENTIAL/PLATELET - Abnormal; Notable for the following:    RBC 3.76 (*)    Hemoglobin 10.1 (*)    HCT 30.6 (*)    All other components within normal limits  COMPREHENSIVE METABOLIC PANEL - Abnormal; Notable for the following:    Sodium 131 (*)    Chloride 96 (*)    Glucose, Bld 850 (*)    Calcium 7.9 (*)    Total Protein 6.3 (*)    AST 232 (*)    ALT 84 (*)    All other components within normal limits  URINALYSIS, ROUTINE W REFLEX MICROSCOPIC (NOT AT [facility]) - Abnormal; Notable for the following:    Specific Gravity, Urine 1.031 (*)    Glucose, UA >1000 (*)    All other components within normal limits  BLOOD GAS, VENOUS - Abnormal; Notable for the following:    pH, Ven 7.354 (*)    pCO2, Ven 40.2 (*)    pO2, Ven 46.5 (*)    Acid-base deficit 2.9 (*)    All other components within normal limits  URINE MICROSCOPIC-ADD ON - Abnormal; Notable for the following:    Squamous Epithelial / LPF 0-5 (*)    All other components within normal limits  CBG MONITORING, ED - Abnormal; Notable for the following:    Glucose-Capillary >600 (*)    All other components within normal limits  I-STAT CG4 LACTIC ACID, ED - Abnormal; Notable for the following:    Lactic Acid, Venous 4.70 (*)    All other components within normal limits  LIPASE, BLOOD  POC URINE PREG, ED    Imaging Review No results found. I have personally reviewed and evaluated these images and lab results as part of my medical decision-making.   EKG Interpretation None      MDM   Final diagnoses:  Hyperglycemia   Dehydration  Transaminitis    Pt is a 34y/o female with hx of type  1 DM on insulin and lantus with recent GI illness for the last 2 1/2 days.  She has 2 family members with same sx.  She  Has had n/v/d without frank blood.  Diffuse abd pain after the vomiting and diarrhea started.  No noted fevers.  Today her sugar was too high to register despite taking her insulin at noon.  Feeling weak, nauseated and continued abd pain.  Also noted dysuria in the last few days.  Concern for DKA related recent GI illness and dehydration vs UTI.  Lower suspicion for pancreatitis, appy, choley, ectopic pregnancy, diverticulitis.  CBC, CMP, lipase, VBG, UA, UPT and lactate pending. Pt given IVF, pain and nausea meds.  5:41 PM Patient's lab work is consistent with hyperglycemia of 850 without evidence of DKA. Lactic acid of 4.7 and elevated LFTs. Total bili is normal and lipase is within normal limits.  Patient denies alcohol and Tylenol use. May be due to dehydration. Patient started on glucose stabilizer will be admitted.  CRITICAL CARE Performed by: Blanchie Dessert Total critical care time: 30 minutes Critical care time was exclusive of separately billable procedures and treating other patients. Critical care was necessary to treat or prevent imminent or life-threatening deterioration. Critical care was time spent personally by me on the following activities: development of treatment plan with patient and/or surrogate as well as nursing, discussions with consultants, evaluation of patient's response to treatment, examination of patient, obtaining history from patient or surrogate, ordering and performing treatments and interventions, ordering and review of laboratory studies, ordering and review of radiographic studies, pulse oximetry and re-evaluation of patient's condition.   Blanchie Dessert, MD 05/03/15 1742  Blanchie Dessert, MD 05/03/15 [phone]

## 2015-05-03 NOTE — ED Notes (Signed)
PT RECEIVED VIA EMS FOR HYPERGLYCEMIA. PT'S HOME METER READ HI AS WELL AS THE EMS METER. PER EMS, AFTER 250ML BOLUS OF NS, REPEAT BS READ HI. PT STATES SHE TOOK REGULAR INSULIN 3 UNITS AND LANTUS 10 UNITS AT 1PM TODAY. PT ALSO C/O ABDOMINAL PAIN WITH N/V/D X3 DAYS. PT STATES OTHER FAMILY MEMBERS SUFFERED THE SAME SYMPTOMS.

## 2015-05-04 LAB — GLUCOSE, CAPILLARY
GLUCOSE-CAPILLARY: 163 mg/dL — AB (ref 65–99)
GLUCOSE-CAPILLARY: 218 mg/dL — AB (ref 65–99)
GLUCOSE-CAPILLARY: 291 mg/dL — AB (ref 65–99)
GLUCOSE-CAPILLARY: 307 mg/dL — AB (ref 65–99)
GLUCOSE-CAPILLARY: 340 mg/dL — AB (ref 65–99)
GLUCOSE-CAPILLARY: 39 mg/dL — AB (ref 65–99)
Glucose-Capillary: 85 mg/dL (ref 65–99)
Glucose-Capillary: 47 mg/dL — ABNORMAL LOW (ref 65–99)
Glucose-Capillary: 229 mg/dL — ABNORMAL HIGH (ref 65–99)
Glucose-Capillary: 98 mg/dL (ref 65–99)
Glucose-Capillary: 113 mg/dL — ABNORMAL HIGH (ref 65–99)
Glucose-Capillary: 158 mg/dL — ABNORMAL HIGH (ref 65–99)
Glucose-Capillary: 288 mg/dL — ABNORMAL HIGH (ref 65–99)

## 2015-05-04 LAB — COMPREHENSIVE METABOLIC PANEL
ALBUMIN: 3 g/dL — AB (ref 3.5–5.0)
ALT: 51 U/L (ref 14–54)
AST: 68 U/L — AB (ref 15–41)
Alkaline Phosphatase: 56 U/L (ref 38–126)
Anion gap: 7 (ref 5–15)
BUN: 11 mg/dL (ref 6–20)
CHLORIDE: 108 mmol/L (ref 101–111)
CO2: 27 mmol/L (ref 22–32)
CREATININE: 0.5 mg/dL (ref 0.44–1.00)
Calcium: 8.1 mg/dL — ABNORMAL LOW (ref 8.9–10.3)
GFR calc Af Amer: >60 mL/min (ref >60)
GFR calc non Af Amer: >60 mL/min (ref >60)
GLUCOSE: 114 mg/dL — AB (ref 65–99)
POTASSIUM: 3 mmol/L — AB (ref 3.5–5.1)
Sodium: 142 mmol/L (ref 135–145)
Total Bilirubin: 0.2 mg/dL — ABNORMAL LOW (ref 0.3–1.2)
Total Protein: 5.3 g/dL — ABNORMAL LOW (ref 6.5–8.1)

## 2015-05-04 LAB — BASIC METABOLIC PANEL
ANION GAP: 8 (ref 5–15)
ANION GAP: 8 (ref 5–15)
BUN: 10 mg/dL (ref 6–20)
BUN: 11 mg/dL (ref 6–20)
CALCIUM: 8 mg/dL — AB (ref 8.9–10.3)
CALCIUM: 8.1 mg/dL — AB (ref 8.9–10.3)
CO2: 21 mmol/L — ABNORMAL LOW (ref 22–32)
CO2: 22 mmol/L (ref 22–32)
Chloride: 108 mmol/L (ref 101–111)
Chloride: 108 mmol/L (ref 101–111)
Creatinine, Ser: 0.62 mg/dL (ref 0.44–1.00)
Creatinine, Ser: 0.67 mg/dL (ref 0.44–1.00)
GFR calc Af Amer: >60 mL/min (ref >60)
Glucose, Bld: 233 mg/dL — ABNORMAL HIGH (ref 65–99)
Glucose, Bld: 310 mg/dL — ABNORMAL HIGH (ref 65–99)
POTASSIUM: 2.9 mmol/L — AB (ref 3.5–5.1)
POTASSIUM: 2.9 mmol/L — AB (ref 3.5–5.1)
SODIUM: 137 mmol/L (ref 135–145)
SODIUM: 138 mmol/L (ref 135–145)

## 2015-05-04 LAB — RAPID URINE DRUG SCREEN, HOSP PERFORMED
AMPHETAMINES: NOT DETECTED
BENZODIAZEPINES: POSITIVE — AB
Barbiturates: NOT DETECTED
Cocaine: NOT DETECTED
Opiates: POSITIVE — AB
TETRAHYDROCANNABINOL: NOT DETECTED

## 2015-05-04 LAB — CBC
HCT: 27.5 % — ABNORMAL LOW (ref 36.0–46.0)
Hemoglobin: 9.3 g/dL — ABNORMAL LOW (ref 12.0–15.0)
MCH: 27.1 pg (ref 26.0–34.0)
MCHC: 33.8 g/dL (ref 30.0–36.0)
MCV: 80.2 fL (ref 78.0–100.0)
PLATELETS: 218 10*3/uL (ref 150–400)
RBC: 3.43 MIL/uL — AB (ref 3.87–5.11)
RDW: 13.7 % (ref 11.5–15.5)
WBC: 5.2 10*3/uL (ref 4.0–10.5)

## 2015-05-04 LAB — LACTIC ACID, PLASMA: LACTIC ACID, VENOUS: 1.4 mmol/L (ref 0.5–2.0)

## 2015-05-04 MED ORDER — SACCHAROMYCES BOULARDII 250 MG PO CAPS
ORAL_CAPSULE | Freq: Two times a day (BID) | ORAL | Status: DC
  Administered 2015-05-04 – 2015-05-05 (×3): 250 mg via ORAL
  Filled 2015-05-04 (×4): qty 1

## 2015-05-04 MED ORDER — DIPHENOXYLATE-ATROPINE 2.5-0.025 MG PO TABS: 1.0000 | ORAL_TABLET | Freq: Four times a day (QID) | ORAL | Status: DC | PRN

## 2015-05-04 MED ORDER — INSULIN ASPART 100 UNIT/ML ~~LOC~~ SOLN
0.0000 [IU] | Freq: Three times a day (TID) | SUBCUTANEOUS | Status: DC
  Administered 2015-05-04: 4 [IU] via SUBCUTANEOUS
  Administered 2015-05-04: 2 [IU] via SUBCUTANEOUS

## 2015-05-04 MED ORDER — POTASSIUM CHLORIDE CRYS ER 20 MEQ PO TBCR
40.0000 meq | EXTENDED_RELEASE_TABLET | Freq: Every day | ORAL | Status: AC
  Administered 2015-05-04 – 2015-05-05 (×2): 40 meq via ORAL
  Filled 2015-05-04 (×2): qty 2

## 2015-05-04 MED ORDER — PANCRELIPASE (LIP-PROT-AMYL) 36000-114000 UNITS PO CPEP
6.0000 | ORAL_CAPSULE | Freq: Three times a day (TID) | ORAL | Status: DC
  Administered 2015-05-04 – 2015-05-05 (×2): 216000 [IU] via ORAL
  Filled 2015-05-04 (×6): qty 6

## 2015-05-04 MED ORDER — ALPRAZOLAM 1 MG PO TABS
1.0000 mg | ORAL_TABLET | Freq: Once | ORAL | Status: AC
  Administered 2015-05-04: 1 mg via ORAL
  Filled 2015-05-04: qty 1

## 2015-05-04 MED ORDER — INSULIN ASPART 100 UNIT/ML ~~LOC~~ SOLN
0.0000 [IU] | Freq: Three times a day (TID) | SUBCUTANEOUS | Status: DC
  Administered 2015-05-05: 2 [IU] via SUBCUTANEOUS
  Administered 2015-05-05: 3 [IU] via SUBCUTANEOUS

## 2015-05-04 MED ORDER — CHOLESTYRAMINE 4 G PO PACK
4.0000 g | PACK | Freq: Two times a day (BID) | ORAL | Status: DC
  Administered 2015-05-04 – 2015-05-05 (×3): 4 g via ORAL
  Filled 2015-05-04 (×5): qty 1

## 2015-05-04 MED ORDER — INSULIN GLARGINE 100 UNIT/ML ~~LOC~~ SOLN
8.0000 [IU] | Freq: Every day | SUBCUTANEOUS | Status: DC
  Administered 2015-05-04 – 2015-05-05 (×2): 8 [IU] via SUBCUTANEOUS
  Filled 2015-05-04 (×2): qty 0.08

## 2015-05-04 MED ORDER — INSULIN ASPART 100 UNIT/ML ~~LOC~~ SOLN: 0.0000 [IU] | Freq: Every day | SUBCUTANEOUS | Status: DC

## 2015-05-04 MED ORDER — DICYCLOMINE HCL 10 MG PO CAPS
10.0000 mg | ORAL_CAPSULE | Freq: Three times a day (TID) | ORAL | Status: DC
  Administered 2015-05-04 – 2015-05-05 (×3): 10 mg via ORAL
  Filled 2015-05-04 (×9): qty 1

## 2015-05-04 MED ORDER — HYDROCODONE-ACETAMINOPHEN 5-325 MG PO TABS
1.0000 | ORAL_TABLET | ORAL | Status: DC | PRN
  Administered 2015-05-04 (×2): 1 via ORAL
  Filled 2015-05-04 (×3): qty 1

## 2015-05-04 MED ORDER — PROCHLORPERAZINE EDISYLATE 5 MG/ML IJ SOLN
10.0000 mg | Freq: Four times a day (QID) | INTRAMUSCULAR | Status: DC | PRN
  Administered 2015-05-04 – 2015-05-05 (×3): 10 mg via INTRAVENOUS
  Filled 2015-05-04 (×3): qty 2

## 2015-05-04 MED ORDER — GABAPENTIN 300 MG PO CAPS
300.0000 mg | ORAL_CAPSULE | Freq: Every day | ORAL | Status: DC
  Administered 2015-05-04: 300 mg via ORAL
  Filled 2015-05-04 (×2): qty 1

## 2015-05-04 MED ORDER — HYDROMORPHONE HCL 1 MG/ML IJ SOLN
0.5000 mg | INTRAMUSCULAR | Status: DC | PRN
  Administered 2015-05-04 – 2015-05-05 (×5): 0.5 mg via INTRAVENOUS
  Filled 2015-05-04 (×5): qty 1

## 2015-05-04 NOTE — Progress Notes (Signed)
[facility]                                                                              Patient Demographics  [name], is a 35 y.o. female, DOB - Sep 13, 1980, [id number]  Admit date - 05/03/2015   Admitting Physician Cristal Ford, DO  Outpatient Primary MD for the patient is Arnoldo Morale, MD  LOS - 1   Chief Complaint  Patient presents with  . Hyperglycemia    PERSONAL METER (HI) EMS METER (HI)  . Abdominal Pain    N/V/D X3 DAYS      HPI on 05/03/2015  [name] is a 35 y.o. female with a history of diabetes mellitus, type I on insulin, diabetic gastroparesis, anxiety, presented to the emergency department with complaints of abdominal discomfort, nausea, vomiting, diarrhea for the last several days. Patient states her family including her husband as well as child had been sick with similar complaints for the last 24 hours. She denies any fevers or chills, chest pain or shortness of breath or cough. Patient states that this episode does not feel like DKA which she has had approximately 40 times since 2012. Patient also states her diarrhea is not reminiscent of C. difficile which she is also having the past. She has been unable to tolerate any food or fluids. She states she has been taking her insulin however her sugars have been very high. In the emergency department, patient was found to have blood glucose of 850. Does not appear to be in DKA. Patient started on IV fluids. [facility] called for admission.  Assessment & Plan   Hyperosmolar nonketotic state in Diabetes mellitus, type I -Admission blood glucose 850, anion gap 13, UA negative for ketones -Initially placed on glucostabilizer -Blood sugars have improved, currently 158 (accucheck) -Will place on NS -Likely secondary to some type of gastroenteritis -Last hemoglobin A1c 13.2 (03/12/2015) -Placed on clear liquid diet -Diabetes coordinator consulted and appreciated -Tranistioned to lantus and ISS  (sensitive)  Elevated LFTs -Likely secondary to dehydration from GI losses -Trending downward -Continue IVF -Will continue to monitor CMP  Diabetic gastroparesis associated with type I Diabetes mellitus -Patient will be nothing by mouth at this time, will reassess once patient's sugars are more controlled -Will restart patient's medications today  Nausea, vomiting, diarrhea -Possibly due to viral etiology vs due to gastroparesis/IBS -patient currently has no white count and is afebrile -Patient is on multiple medications- restarted today  Exocrine pancreatic insufficiency -Will restart Creon today  Depression/anxiety -Continue Elavil and Xanax  Pseudohyponatremia -Resolved -Likely secondary to hyperglycemia, continue to monitor BMP  Lactic acidosis -Resovled, was 4.7 upon admission, currently 1.4 -Likely secondary to the HONK and dehydration  Normocytic anemia -Hemoglobin currently 9.3, continue monitor CBC -Suspect hemoglobin due to IV fluids  Code Status: Full  Family Communication: None at bedside  Disposition Plan: Admitted.  Weaned off of insulin drip, transfer to med floor.   Time Spent in minutes   30 minutes  Procedures  None  Consults   None  DVT Prophylaxis  lovenox  Lab Results  Component Value Date   PLT 218 05/04/2015    Medications  Scheduled Meds: . amitriptyline  100 mg Oral QHS  . enoxaparin (LOVENOX) injection  30 mg Subcutaneous Q24H  . insulin aspart  0-5 Units Subcutaneous QHS  . insulin aspart  0-9 Units Subcutaneous TID WC  . insulin glargine  8 Units Subcutaneous Daily  . potassium chloride  40 mEq Oral Daily   Continuous Infusions: . sodium chloride 125 mL/hr at 05/04/15 0940  . sodium chloride Stopped (05/03/15 2236)  . dextrose 5 % and 0.45% NaCl    . dextrose 5 % and 0.45% NaCl Stopped (05/04/15 0826)   PRN Meds:.acetaminophen **OR** acetaminophen, dextrose, HYDROmorphone (DILAUDID) injection, ondansetron **OR**  ondansetron (ZOFRAN) IV, prochlorperazine, promethazine  Antibiotics    Anti-infectives    None      Subjective:   [name] seen and examined today.  Patient denies chest pain, shortness of breath, dizziness, headache.  Feels abdominal pain, nausea has improved. Had 2 episodes of diarrhea overnight.   Objective:   Filed Vitals:   05/03/15 2200 05/04/15 0000 05/04/15 0400 05/04/15 0800  BP: 87/59 93/59 82/52    Pulse: 88 91 81   Temp:  98.6 F (37 C) 98 F (36.7 C) 97.9 F (36.6 C)  TempSrc:  Oral Oral Oral  Resp: 38 20 18   Height:      Weight:      SpO2: 100% 100% 100%     Wt Readings from Last 3 Encounters:  05/03/15 40.824 kg (90 lb)  04/01/15 42.003 kg (92 lb 9.6 oz)  03/12/15 44.09 kg (97 lb 3.2 oz)     Intake/Output Summary (Last 24 hours) at 05/04/15 1206 Last data filed at 05/04/15 0700  Gross per 24 hour  Intake   1000 ml  Output      0 ml  Net   1000 ml    Exam  General: Well developed, well nourished, NAD, appears stated age  33: NCAT, mucous membranes moist.   Cardiovascular: S1 S2 auscultated, RRR, no murmurs  Respiratory: Clear to auscultation bilaterally   Abdomen: Soft, nontender, nondistended, + bowel sounds  Extremities: warm dry without cyanosis clubbing or edema  Neuro: AAOx3, nonfocal  Skin: Without rashes exudates or nodules, multiple tattoos and piercings   Psych: Normal affect and demeanor  Data Review   Micro Results Recent Results (from the past 240 hour(s))  MRSA PCR Screening     Status: None   Collection Time: 05/03/15  9:40 PM  Result Value Ref Range Status   MRSA by PCR NEGATIVE NEGATIVE Final    Comment:        The GeneXpert MRSA Assay (FDA approved for NASAL specimens only), is one component of a comprehensive MRSA colonization surveillance program. It is not intended to diagnose MRSA infection nor to guide or monitor treatment for MRSA infections.     Radiology Reports No results  found.  CBC  Recent Labs Lab 05/03/15 1641 05/03/15 2226 05/04/15 0710  WBC 6.1 5.3 5.2  HGB 10.1* 9.3* 9.3*  HCT 30.6* 27.3* 27.5*  PLT 241 218 218  MCV 81.4 79.4 80.2  MCH 26.9 27.0 27.1  MCHC 33.0 34.1 33.8  RDW 13.8 13.6 13.7  LYMPHSABS 1.5  --   --   MONOABS 0.5  --   --   EOSABS 0.0  --   --   BASOSABS 0.0  --   --     Chemistries   Recent Labs Lab 05/03/15 1641 05/03/15 2226 05/04/15 0030 05/04/15 0355 05/04/15 0710  NA 131* 137 137 138 142  K  4.2 3.2* 2.9* 2.9* 3.0*  CL 96* 108 108 108 108  CO2 22 22 21* 22 27  GLUCOSE 850* 83 310* 233* 114*  BUN 16 10 11 10 11   CREATININE 0.88 0.48 0.67 0.62 0.50  CALCIUM 7.9* 8.2* 8.0* 8.1* 8.1*  AST 232*  --   --   --  68*  ALT 84*  --   --   --  51  ALKPHOS 74  --   --   --  56  BILITOT 0.5  --   --   --  0.2*   ------------------------------------------------------------------------------------------------------------------ estimated creatinine Mandy is 63.2 mL/min (by C-G formula based on Cr of 0.5). ------------------------------------------------------------------------------------------------------------------ No results for input(s): HGBA1C in the last 72 hours. ------------------------------------------------------------------------------------------------------------------ No results for input(s): CHOL, HDL, LDLCALC, TRIG, CHOLHDL, LDLDIRECT in the last 72 hours. ------------------------------------------------------------------------------------------------------------------ No results for input(s): TSH, T4TOTAL, T3FREE, THYROIDAB in the last 72 hours.  Invalid input(s): FREET3 ------------------------------------------------------------------------------------------------------------------ No results for input(s): VITAMINB12, FOLATE, FERRITIN, TIBC, IRON, RETICCTPCT in the last 72 hours.  Coagulation profile No results for input(s): INR, PROTIME in the last 168 hours.  No results for input(s):  DDIMER in the last 72 hours.  Cardiac Enzymes No results for input(s): CKMB, TROPONINI, MYOGLOBIN in the last 168 hours.  Invalid input(s): CK ------------------------------------------------------------------------------------------------------------------ Invalid input(s): POCBNP    Tarl Cephas D.O. on 05/04/2015 at 12:06 PM  Between 7am to 7pm - Pager - [phone]  After 7pm go to [email or web address] - password [email or web address]  And look for the night coverage person covering for me after hours  [facility] Office  [phone]

## 2015-05-04 NOTE — Accreditation Note (Signed)
Inpatient Diabetes Program Recommendations  AACE/ADA: New Consensus Statement on Inpatient Glycemic Control (2015)  Target Ranges:  Prepandial:   less than 140 mg/dL      Peak postprandial:   less than 180 mg/dL (1-2 hours)      Critically ill patients:  140 - 180 mg/dL   Review of Glycemic Control  Diabetes history: DM 1 Outpatient Diabetes medications: Lantus 9 units QAM, 7 units QPM, Novolog 0-5 units TID with meals Current orders for Inpatient glycemic control: Lantus 8 units Daily, Novolog Sensitive + HS scale  Inpatient Diabetes Program Recommendations:   Note during past admissions DM Coordinator has spoken with patient about uncontrolled glucose levels at home. We found she has a fear of hypoglycemia and with the history of gastroparesis she sometimes has vomiting or diarrhea after she eats, so she does not take her insulin at times. During this acute vomiting stage, she probably was not taking her insulin due to fear of hypoglycemia. Patient takes basal insulin BID, May need to increase frequency of basal insulin while inpatient. Will follow while here.  Thanks,  Tama Headings RN, MSN, [facility] Inpatient Diabetes Coordinator Team Pager [phone] (8a-5p)

## 2015-05-05 ENCOUNTER — Ambulatory Visit: Payer: Self-pay | Admitting: Family Medicine

## 2015-05-05 LAB — CBC
HEMATOCRIT: 27.9 % — AB (ref 36.0–46.0)
HEMOGLOBIN: 8.8 g/dL — AB (ref 12.0–15.0)
MCH: 27.1 pg (ref 26.0–34.0)
MCHC: 31.5 g/dL (ref 30.0–36.0)
MCV: 85.8 fL (ref 78.0–100.0)
Platelets: 194 10*3/uL (ref 150–400)
RBC: 3.25 MIL/uL — AB (ref 3.87–5.11)
RDW: 14.3 % (ref 11.5–15.5)
WBC: 4.9 10*3/uL (ref 4.0–10.5)

## 2015-05-05 LAB — COMPREHENSIVE METABOLIC PANEL
ALBUMIN: 2.8 g/dL — AB (ref 3.5–5.0)
ALT: 47 U/L (ref 14–54)
AST: 57 U/L — AB (ref 15–41)
Alkaline Phosphatase: 55 U/L (ref 38–126)
Anion gap: 5 (ref 5–15)
BILIRUBIN TOTAL: 0.3 mg/dL (ref 0.3–1.2)
BUN: 8 mg/dL (ref 6–20)
CO2: 20 mmol/L — ABNORMAL LOW (ref 22–32)
CREATININE: 0.52 mg/dL (ref 0.44–1.00)
Calcium: 7.9 mg/dL — ABNORMAL LOW (ref 8.9–10.3)
Chloride: 111 mmol/L (ref 101–111)
GFR calc Af Amer: >60 mL/min (ref >60)
GFR calc non Af Amer: >60 mL/min (ref >60)
GLUCOSE: 377 mg/dL — AB (ref 65–99)
POTASSIUM: 4.3 mmol/L (ref 3.5–5.1)
Sodium: 136 mmol/L (ref 135–145)
TOTAL PROTEIN: 5 g/dL — AB (ref 6.5–8.1)

## 2015-05-05 LAB — GLUCOSE, CAPILLARY
GLUCOSE-CAPILLARY: 296 mg/dL — AB (ref 65–99)
GLUCOSE-CAPILLARY: 202 mg/dL — AB (ref 65–99)

## 2015-05-05 MED ORDER — PROMETHAZINE HCL 25 MG RE SUPP: 25.0000 mg | Freq: Four times a day (QID) | RECTAL | Status: DC | PRN

## 2015-05-05 MED ORDER — PROCHLORPERAZINE MALEATE 10 MG PO TABS: 10.0000 mg | ORAL_TABLET | Freq: Two times a day (BID) | ORAL | Status: DC | PRN

## 2015-05-05 NOTE — Discharge Summary (Signed)
Physician Discharge Summary  [name] [id number] DOB: 04-Nov-1980 DOA: 05/03/2015  PCP: Arnoldo Morale, MD  Admit date: 05/03/2015 Discharge date: 05/05/2015  Time spent: 45 minutes  Recommendations for Outpatient Follow-up:  Patient will be discharged to home.  Patient will need to follow up with primary care provider within one week of discharge.  Patient will need to follow up with endocrinologist, Dr. Paticia Stack in 1-2 weeks. Patient should continue medications as prescribed.  Patient should follow a carb modified diet.   Discharge Diagnoses: Hyperosmolar nonketotic state diabetes mellitus, type I Elevated LFTs Diabetic gastroparesis associated with type 1 diabetes mellitus Nausea, vomiting, diarrhea Exocrine pancreatic insufficiency Depression/anxiety Pseudohyponatremia Lactic acidosis Normocytic anemia   Discharge Condition: Stable  Diet recommendation: carb modified   Filed Weights   05/03/15 1538 05/05/15 0546  Weight: 40.824 kg (90 lb) 48.8 kg (107 lb 9.4 oz)    History of present illness:  [name] is a 35 y.o. female with a history of diabetes mellitus, type I on insulin, diabetic gastroparesis, anxiety, presented to the emergency department with complaints of abdominal discomfort, nausea, vomiting, diarrhea for the last several days. Patient states her family including her husband as well as child had been sick with similar complaints for the last 24 hours. She denies any fevers or chills, chest pain or shortness of breath or cough. Patient states that this episode does not feel like DKA which she has had approximately 40 times since 2012. Patient also states her diarrhea is not reminiscent of C. difficile which she is also having the past. She has been unable to tolerate any food or fluids. She states she has been taking her insulin however her sugars have been very high. In the emergency department, patient was found to have blood glucose of 850. Does not  appear to be in DKA. Patient started on IV fluids. [facility] called for admission.  Hospital Course:  Hyperosmolar nonketotic state in Diabetes mellitus, type I -Admission blood glucose 850, anion gap 13, UA negative for ketones -Initially placed on glucostabilizer -Likely secondary to some type of gastroenteritis vs noncompliance with meds -Last hemoglobin A1c 13.2 (03/12/2015) -Continue carb modified diet -Diabetes coordinator consulted and appreciated -Tranistioned to lantus and ISS (sensitive)- made tailored scale for patient as she felt the sensitive scale was too much insulin and she fears her sugars will drop. -patient is to follow up closely with her endocrinologist at [facility]  Elevated LFTs -Likely secondary to dehydration from GI losses -Trending downward  Diabetic gastroparesis associated with type I Diabetes mellitus -Patient will be nothing by mouth at this time, will reassess once patient's sugars are more controlled -Continue home regimen  Nausea, vomiting, diarrhea -Possibly due to viral etiology vs due to gastroparesis/IBS -patient currently has no white count and is afebrile -Continue home regimen.  Follow up with gastroenerologist at [place] note, patient stated she had several episodes of diarrhea and was very nauseous however want to eat. She also wanted to have IV Compazine, Zofran, Phenergan suppositories with pain medication (diluadid and hydrocodone) but was very hungry.  Exocrine pancreatic insufficiency -Continue creon  Depression/anxiety -Continue Elavil and Xanax  Pseudohyponatremia -Resolved -Likely secondary to hyperglycemia, continue to monitor BMP  Lactic acidosis -Resovled, was 4.7 upon admission, currently 1.4 -Likely secondary to the HONK and dehydration  Normocytic anemia -Hemoglobin currently 8.8, continue monitor CBC -Suspect hemoglobin due to IV fluids  Procedures:  None  Consultations:  None  Discharge Exam: Filed Vitals:    05/04/15 2145  05/05/15 0546  BP: 103/71 94/58  Pulse: 91 90  Temp: 97.7 F (36.5 C) 98.2 F (36.8 C)  Resp: 16 16   Exam  General: Well developed, well nourished, NAD  HEENT: NCAT, mucous membranes moist.   Cardiovascular: S1 S2 auscultated, RRR, no murmurs  Respiratory: Clear to auscultation bilaterally  Abdomen: Soft, nontender, nondistended, + bowel sounds  Extremities: warm dry without cyanosis clubbing or edema  Neuro: AAOx3, nonfocal  Skin: Without rashes exudates or nodules, multiple tattoos and piercings  Psych: Normal affect and demeanor  Discharge Instructions      Discharge Instructions    Discharge instructions    Complete by:  As directed   Patient will be discharged to home.  Patient will need to follow up with primary care provider within one week of discharge.  Patient will need to follow up with endocrinologist, Dr. Paticia Stack in 1-2 weeks. Patient should continue medications as prescribed.  Patient should follow a carb modified diet.            Medication List    TAKE these medications        acetaminophen 500 MG tablet  Commonly known as:  TYLENOL  Take 1,000 mg by mouth every 6 (six) hours as needed for moderate pain.     acetaminophen-codeine 300-30 MG tablet  Commonly known as:  TYLENOL #3  Take 1 tablet by mouth every 12 (twelve) hours as needed for moderate pain.     ALPRAZolam 1 MG tablet  Commonly known as:  XANAX  Take 1 mg by mouth 2 (two) times daily as needed for anxiety.     amitriptyline 50 MG tablet  Commonly known as:  ELAVIL  Take 2 tablets (100 mg total) by mouth at bedtime.     CALCIUM PO  Take 2 tablets by mouth daily.     cholestyramine 4 g packet  Commonly known as:  QUESTRAN  Take 1 packet (4 g total) by mouth 2 (two) times daily.     CREON 36000 UNITS Cpep capsule  Generic drug:  lipase/protease/amylase  Take 4-6 capsules by mouth. Takes 6 capsules with every meal and 4 capsules with every snack.      cyanocobalamin 500 MCG tablet  Take 2,000 mcg by mouth 2 (two) times daily as needed (mouth sores).     dicyclomine 10 MG capsule  Commonly known as:  BENTYL  Take 10 mg by mouth 4 (four) times daily -  before meals and at bedtime. Reported on 04/01/2015     DIGESTIVE ADVANTAGE GUMMIES PO  Take 2 tablets by mouth 2 (two) times daily.     diphenoxylate-atropine 2.5-0.025 MG tablet  Commonly known as:  LOMOTIL  Take 1 tablet by mouth 4 (four) times daily as needed for diarrhea or loose stools.     feeding supplement (GLUCERNA SHAKE) Liqd  Take 237 mLs by mouth 3 (three) times daily between meals.     gabapentin 100 MG capsule  Commonly known as:  NEURONTIN  Take 300 mg by mouth at bedtime.     glucagon 1 MG injection  Commonly known as:  GLUCAGON EMERGENCY  Inject 1 mg into the muscle once as needed (severe hypoglycemia).     guaifenesin 100 MG/5ML syrup  Commonly known as:  ROBITUSSIN  Take 200 mg by mouth 3 (three) times daily as needed for cough.     HYDROcodone-acetaminophen 5-325 MG tablet  Commonly known as:  NORCO/VICODIN  Take 1 tablet by mouth every 4 (  four) hours as needed (dental pain).     insulin glargine 100 UNIT/ML injection  Commonly known as:  LANTUS  Inject 0.07-0.08 mLs (7-8 Units total) into the skin 2 (two) times daily. 8 units in the morning and 7 units at night     multivitamin with minerals Tabs tablet  Take 2 tablets by mouth daily. Diabetic support vitamin     NOVOLOG FLEXPEN 100 UNIT/ML FlexPen  Generic drug:  insulin aspart  Inject 0-5 Units into the skin 3 (three) times daily as needed for high blood sugar.     omeprazole 40 MG capsule  Commonly known as:  PRILOSEC  Take 40 mg by mouth daily as needed (indigestion).     prochlorperazine 10 MG tablet  Commonly known as:  COMPAZINE  Take 1 tablet (10 mg total) by mouth 2 (two) times daily as needed for nausea or vomiting.     promethazine 25 MG tablet  Commonly known as:  PHENERGAN  Take 1  tablet (25 mg total) by mouth every 6 (six) hours as needed for nausea or vomiting.     promethazine 25 MG suppository  Commonly known as:  PHENERGAN  Place 1 suppository (25 mg total) rectally every 6 (six) hours as needed for nausea or vomiting.     saccharomyces boulardii 250 MG capsule  Commonly known as:  FLORASTOR  Take 1 capsule (250 mg total) by mouth 2 (two) times daily.     traZODone 50 MG tablet  Commonly known as:  DESYREL  Take 1 tablet (50 mg total) by mouth at bedtime as needed for sleep.     VIBERZI 100 MG Tabs  Generic drug:  Eluxadoline  Take 100 mg by mouth 2 (two) times daily. Reported on 04/01/2015       Allergies  Allergen Reactions  . Scallops [Shellfish Allergy] Anaphylaxis  . Diphenhydramine Hcl Nausea Only    IV diphenhydramine.  Oral capsules ok.    . Haloperidol And Related Itching    Pt wanted to rip skin off  . Ketorolac Itching  . Morphine And Related Itching  . Toradol [Ketorolac Tromethamine]     Pt wanted to rip skin off  . Latex Rash  . Other Rash    Sunscreen and grass   Follow-up Information    Follow up with Arnoldo Morale, MD. Schedule an appointment as soon as possible for a visit in 1 week.   Specialty:  Family Medicine   Why:  Hospital follow up   Contact information:   [place] [address] [phone]       Follow up with Army Melia, MD. Schedule an appointment as soon as possible for a visit in 1 week.   Specialty:  Endocrinology   Why:  Hospital follow up   Contact information:   [place] [address] [phone]        The results of significant diagnostics from this hospitalization (including imaging, microbiology, ancillary and laboratory) are listed below for reference.    Significant Diagnostic Studies: No results found.  Microbiology: Recent Results (from the past 240 hour(s))  MRSA PCR Screening     Status: None   Collection Time: 05/03/15   9:40 PM  Result Value Ref Range Status   MRSA by PCR NEGATIVE NEGATIVE Final    Comment:        The GeneXpert MRSA Assay (FDA approved for NASAL specimens only), is one component of a comprehensive  MRSA colonization surveillance program. It is not intended to diagnose MRSA infection nor to guide or monitor treatment for MRSA infections.      Labs: Basic Metabolic Panel:  Recent Labs Lab 05/03/15 2226 05/04/15 0030 05/04/15 0355 05/04/15 0710 05/05/15 0417  NA 137 137 138 142 136  K 3.2* 2.9* 2.9* 3.0* 4.3  CL 108 108 108 108 111  CO2 22 21* 22 27 20*  GLUCOSE 83 310* 233* 114* 377*  BUN 10 11 10 11 8   CREATININE 0.48 0.67 0.62 0.50 0.52  CALCIUM 8.2* 8.0* 8.1* 8.1* 7.9*   Liver Function Tests:  Recent Labs Lab 05/03/15 1641 05/04/15 0710 05/05/15 0417  AST 232* 68* 57*  ALT 84* 51 47  ALKPHOS 74 56 55  BILITOT 0.5 0.2* 0.3  PROT 6.3* 5.3* 5.0*  ALBUMIN 3.6 3.0* 2.8*    Recent Labs Lab 05/03/15 1641  LIPASE 18   No results for input(s): AMMONIA in the last 168 hours. CBC:  Recent Labs Lab 05/03/15 1641 05/03/15 2226 05/04/15 0710 05/05/15 0417  WBC 6.1 5.3 5.2 4.9  NEUTROABS 4.1  --   --   --   HGB 10.1* 9.3* 9.3* 8.8*  HCT 30.6* 27.3* 27.5* 27.9*  MCV 81.4 79.4 80.2 85.8  PLT 241 218 218 194   Cardiac Enzymes: No results for input(s): CKTOTAL, CKMB, CKMBINDEX, TROPONINI in the last 168 hours. BNP: BNP (last 3 results) No results for input(s): BNP in the last 8760 hours.  ProBNP (last 3 results) No results for input(s): PROBNP in the last 8760 hours.  CBG:  Recent Labs Lab 05/04/15 0745 05/04/15 1224 05/04/15 1738 05/04/15 2147 05/05/15 0810  GLUCAP 158* 163* 307* 288* 296*       Signed:  Cristal Ford  [facility] 05/05/2015, 10:41 AM

## 2015-05-05 NOTE — Care Management Note (Signed)
Case Management Note  Patient Details  Name: [name] MRN: [id number] Date of Birth: 06-02-1980  Subjective/Objective:   Admitted with Hyperosmolar nonketotic state diabetes mellitus, type I, gastroenteritis                 Action/Plan: Discharge planning, no HH needs identified  Expected Discharge Date:                  Expected Discharge Plan:  Home/Self Care  In-House Referral:  NA  Discharge planning Services  CM Consult  Post Acute Care Choice:  NA Choice offered to:  NA  DME Arranged:  N/A DME Agency:  NA  HH Arranged:  NA HH Agency:  NA  Status of Service:  Completed, signed off  Medicare Important Message Given:    Date Medicare IM Given:    Medicare IM give by:    Date Additional Medicare IM Given:    Additional Medicare Important Message give by:     If discussed at [place] of Stay Meetings, dates discussed:    Additional Comments:  Guadalupe Maple, RN 05/05/2015, 10:58 AM [phone]

## 2015-05-05 NOTE — Discharge Instructions (Signed)
Hyperglycemic Hyperosmolar State Hyperglycemic hyperosmolar state is a serious condition in which you experience an extreme increase in your blood sugar (glucose) level. This makes your body become extremely dehydrated, which can be life threatening.  This condition usually happens to people with type 2 diabetes mellitus. It may occur over a period of days or even weeks. It may occur in those who have not been diagnosed with diabetes mellitus or in those people who have not been able to control their diabetes mellitus for various reasons. Normally your kidneys try to get rid of extra glucose through urine. However, if you do not drink enough fluids or if you drink fluids that contain sugar, your kidneys will no longer be able to get rid of the extra glucose and your blood glucose levels will increase. CAUSES  This condition may be brought on by:  Infection.  Use of medicines that cause you to become dehydrated or cause you to lose fluid.  Other illnesses.  Not taking your diabetes medication. RISK FACTORS  Older age.  Poor management of diabetes mellitus.  Inability to eat or drink normally.  Heart failure.  Infection.  Surgery.  Illness. SYMPTOMS   Extreme or increased thirst (although this may gradually disappear).  Increased urination.  Dry, parched mouth.  Warm, dry skin that does not sweat.  High fever.  Sleepiness or confusion.  Vision problems or vision loss.  Seeing or hearing things that are not there (hallucinations).  Weakness on one side of the body. DIAGNOSIS  Hyperglycemic hyperosmolar state is diagnosed with a medical history and evaluation of laboratory test results.  TREATMENT  The goal of treatment is to correct the dehydration. Fluids are replaced through an IV tube.  HOME CARE INSTRUCTIONS  Check your blood glucose level regularly.  Talk with your health care provider about when to check your blood glucose level and what the numbers  mean.  Check your blood glucose level more often when you are sick. Ask your health care provider about your sick day plan.  Always stay well hydrated especially when you are ill, exercising, or you are out in the heat. SEEK MEDICAL CARE IF:  You are ill and cannot eat or drink.  You develop a fever. SEEK IMMEDIATE MEDICAL CARE IF: You develop any of the symptoms listed above of hyperglycemic hyperosmolar state. MAKE SURE YOU:  Understand these instructions.   Will watch your condition.   Will get help right away if you are not doing well or get worse.   This information is not intended to replace advice given to you by your health care provider. Make sure you discuss any questions you have with your health care provider.   Document Released: 12/12/2003 Document Revised: 03/01/2014 Document Reviewed: 07/18/2012 Elsevier Interactive Patient Education [employer].

## 2015-05-06 ENCOUNTER — Inpatient Hospital Stay: Payer: Self-pay | Admitting: Internal Medicine

## 2015-05-26 ENCOUNTER — Ambulatory Visit: Payer: Medicaid Other | Attending: Family Medicine | Admitting: Family Medicine

## 2015-05-26 VITALS — BP 106/78 | HR 96 | Temp 97.5°F | Resp 15 | Ht 65.0 in | Wt 87.2 lb

## 2015-05-26 DIAGNOSIS — K909 Intestinal malabsorption, unspecified: Secondary | ICD-10-CM

## 2015-05-26 DIAGNOSIS — K0889 Other specified disorders of teeth and supporting structures: Secondary | ICD-10-CM

## 2015-05-26 DIAGNOSIS — E1065 Type 1 diabetes mellitus with hyperglycemia: Secondary | ICD-10-CM

## 2015-05-26 DIAGNOSIS — E1043 Type 1 diabetes mellitus with diabetic autonomic (poly)neuropathy: Secondary | ICD-10-CM | POA: Diagnosis not present

## 2015-05-26 DIAGNOSIS — K3184 Gastroparesis: Secondary | ICD-10-CM

## 2015-05-26 DIAGNOSIS — R197 Diarrhea, unspecified: Secondary | ICD-10-CM

## 2015-05-26 DIAGNOSIS — E43 Unspecified severe protein-calorie malnutrition: Secondary | ICD-10-CM

## 2015-05-26 DIAGNOSIS — F411 Generalized anxiety disorder: Secondary | ICD-10-CM

## 2015-05-26 LAB — POCT URINALYSIS DIPSTICK
Bilirubin, UA: NEGATIVE
GLUCOSE UA: 500
Ketones, UA: NEGATIVE
Leukocytes, UA: NEGATIVE
Nitrite, UA: NEGATIVE
SPEC GRAV UA: 1.02
UROBILINOGEN UA: 0.2
pH, UA: 5.5

## 2015-05-26 LAB — GLUCOSE, POCT (MANUAL RESULT ENTRY): POC GLUCOSE: 459 mg/dL — AB (ref 70–99)

## 2015-05-26 MED ORDER — PROMETHAZINE HCL 25 MG RE SUPP
25.0000 mg | Freq: Four times a day (QID) | RECTAL | Status: DC | PRN
Start: 2015-05-26 — End: 2015-05-26

## 2015-05-26 MED ORDER — HYDROXYZINE HCL 25 MG PO TABS: 25.0000 mg | ORAL_TABLET | Freq: Three times a day (TID) | ORAL | Status: DC | PRN

## 2015-05-26 MED ORDER — PROMETHAZINE HCL 25 MG RE SUPP: 25.0000 mg | Freq: Four times a day (QID) | RECTAL | Status: DC | PRN

## 2015-05-26 NOTE — Progress Notes (Signed)
Subjective:  Patient ID: [name], female    DOB: October 26, 1980  Age: 35 y.o. MRN: [id number]  CC: No chief complaint on file.   HPI AUBREN VACCARELLO is a 35 year old female with a history of type 1 diabetes mellitus (A1c 13.2), gastroparesis secondary to diabetes, chronic diarrhea , pancreatic insufficiency with multiple ED visits for severe abdominal pain , chronic diarrhea and hyperglycemia.  Diabetes mellitus is managed by endocrinology- Dr Madelon Lips at [facility] and she was previously seen by a gastroenterologist at [facility] as well but was "informed to come back when her blood sugars were under control". She would like to be referred to another gastroenterologist- "Dr Mikki Santee ([id number])- with ?[facility] but practices in [place]".  Today she complains of tooth ache at the site of her right lower jaw where she had an extraction 4 days ago but has been unable to eat due to pain despite taking her pain medications. She also continues to have chronic diarrhea every time she eats and has to wear a diaper constantly. She has only been able to take sips of water or Glucerna and has lost 20 lbs in the last 2 weeks. She has not been taking her mealtime insulin due to not eating but has been taking her Lantus.  Outpatient Prescriptions Prior to Visit  Medication Sig Dispense Refill  . acetaminophen (TYLENOL) 500 MG tablet Take 1,000 mg by mouth every 6 (six) hours as needed for moderate pain.     Marland Kitchen acetaminophen-codeine (TYLENOL #3) 300-30 MG tablet Take 1 tablet by mouth every 12 (twelve) hours as needed for moderate pain. 60 tablet 1  . ALPRAZolam (XANAX) 1 MG tablet Take 1 mg by mouth 2 (two) times daily as needed for anxiety.    Marland Kitchen amitriptyline (ELAVIL) 50 MG tablet Take 2 tablets (100 mg total) by mouth at bedtime. 60 tablet 0  . CALCIUM PO Take 2 tablets by mouth daily.    . cholestyramine (QUESTRAN) 4 G packet Take 1 packet (4 g total) by mouth 2 (two) times daily.  (Patient taking differently: Take 4 g by mouth 4 (four) times daily. ) 60 each 12  . CREON 36000 UNITS CPEP capsule Take 4-6 capsules by mouth. Takes 6 capsules with every meal and 4 capsules with every snack.  1  . cyanocobalamin 500 MCG tablet Take 2,000 mcg by mouth 2 (two) times daily as needed (mouth sores).     Marland Kitchen dicyclomine (BENTYL) 10 MG capsule Take 10 mg by mouth 4 (four) times daily -  before meals and at bedtime. Reported on 04/01/2015    . diphenoxylate-atropine (LOMOTIL) 2.5-0.025 MG per tablet Take 1 tablet by mouth 4 (four) times daily as needed for diarrhea or loose stools. 30 tablet 0  . Eluxadoline (VIBERZI) 100 MG TABS Take 100 mg by mouth 2 (two) times daily. Reported on 04/01/2015    . feeding supplement, GLUCERNA SHAKE, (GLUCERNA SHAKE) LIQD Take 237 mLs by mouth 3 (three) times daily between meals. (Patient taking differently: Take 237 mLs by mouth 4 (four) times daily. ) 90 Can 0  . gabapentin (NEURONTIN) 100 MG capsule Take 300 mg by mouth at bedtime.     Marland Kitchen glucagon (GLUCAGON EMERGENCY) 1 MG injection Inject 1 mg into the muscle once as needed (severe hypoglycemia). 2 each prn  . guaifenesin (ROBITUSSIN) 100 MG/5ML syrup Take 200 mg by mouth 3 (three) times daily as needed for cough.    Marland Kitchen HYDROcodone-acetaminophen (NORCO/VICODIN) 5-325  MG tablet Take 1 tablet by mouth every 4 (four) hours as needed (dental pain).   0  . insulin aspart (NOVOLOG FLEXPEN) 100 UNIT/ML FlexPen Inject 0-5 Units into the skin 3 (three) times daily as needed for high blood sugar.     . insulin glargine (LANTUS) 100 UNIT/ML injection Inject 0.07-0.08 mLs (7-8 Units total) into the skin 2 (two) times daily. 8 units in the morning and 7 units at night (Patient taking differently: Inject 7-9 Units into the skin 2 (two) times daily. 9 units in the morning and 7 units at night) 10 mL 3  . Multiple Vitamin (MULTIVITAMIN WITH MINERALS) TABS Take 2 tablets by mouth daily. Diabetic support vitamin    . omeprazole  (PRILOSEC) 40 MG capsule Take 40 mg by mouth daily as needed (indigestion).     . Probiotic Product (DIGESTIVE ADVANTAGE GUMMIES PO) Take 2 tablets by mouth 2 (two) times daily.    . prochlorperazine (COMPAZINE) 10 MG tablet Take 1 tablet (10 mg total) by mouth 2 (two) times daily as needed for nausea or vomiting. 30 tablet 0  . promethazine (PHENERGAN) 25 MG tablet Take 1 tablet (25 mg total) by mouth every 6 (six) hours as needed for nausea or vomiting. 12 tablet 0  . saccharomyces boulardii (FLORASTOR) 250 MG capsule Take 1 capsule (250 mg total) by mouth 2 (two) times daily. 60 capsule 2  . traZODone (DESYREL) 50 MG tablet Take 1 tablet (50 mg total) by mouth at bedtime as needed for sleep. 30 tablet 2  . promethazine (PHENERGAN) 25 MG suppository Place 1 suppository (25 mg total) rectally every 6 (six) hours as needed for nausea or vomiting. 11 suppository 0   No facility-administered medications prior to visit.    ROS Review of Systems General: negative for fever, weight loss, appetite change Eyes: no visual symptoms. ENT: no ear symptoms, no sinus tenderness, no nasal congestion or sore throat, difficult toothache Neck: no pain  Respiratory: no wheezing, shortness of breath, cough Cardiovascular: no chest pain, no dyspnea on exertion, no pedal edema, no orthopnea. Gastrointestinal: see hpi Genito-Urinary: no urinary frequency, no dysuria, no polyuria. Hematologic: no bruising Endocrine: no cold or heat intolerance Neurological: no headaches, no seizures, no tremors Musculoskeletal: no joint pains, no joint swelling Skin: no pruritus, no rash. Psychological: no depression, positive for anxiety,  Objective: Filed Vitals:   05/26/15 1547  BP: 106/78  Pulse: 96  Temp: 97.5 F (36.4 C)  Resp: 15  Height: 5\' 5"  (1.651 m)  Weight: 87 lb 3.2 oz (39.554 kg)  SpO2: 97%       BP/Weight 05/05/2015 04/19/2015 [id number]  Systolic BP 123456 96 94  Diastolic BP 88 75 65  Wt. (Lbs)  107.58 - -  BMI 17.9 - -      Physical Exam Constitutional: thin-appearing, chronically ill Eyes: PERRLA HEENT: Head is atraumatic, normal sinuses, normal oropharynx, normal appearing tonsils and palate, tympanic membrane is normal bilaterally, multiple tooth extractions, right lower jaw with no evidence of abscess at the site of tooth extraction. Neck: normal range of motion, no thyromegaly, no JVD Cardiovascular: Normal rate rate and regular rhythm, normal heart sounds, no murmurs, rub or gallop, no pedal edema Respiratory: clear to auscultation bilaterally, no wheezes, no rales, no rhonchi Abdomen: soft, Tender to palpation, hyperactive bowel sounds. Extremities: Full ROM, no tenderness in joints Skin: warm and dry, no lesions. Neurological: alert, oriented x3, cranial nerves I-XII grossly intact , normal motor strength, normal sensation. Psychological: normal  mood.  Assessment & Plan:   1. Type 1 diabetes mellitus with hyperglycemia (HCC) A1c of 13.2 Hyperglycemia with blood sugar of 459- unable to administer insulin as the patient has not been tolerating orally I have given her a copy of sliding scale to apply 3 times a day with any oral intake as needed as the patient has not been using her insulins as prescribed due to the fact that she has not had a real meal. Continue Lantus 9 units in the morning and 7 units as prescribed by her endocrinologist - Dr Paticia Stack Advised to schedule follow-up with endocrinologist - Glucose (CBG) - Urinalysis Dipstick  2. Protein-calorie malnutrition, severe ([place]) This is secondary to chronic diarrhea and also due to the fact that patient is afraid to eat for fear of diarrhea  3. Diabetic gastroparesis associated with type 1 diabetes mellitus ([place]) She suffers from severe nausea but has not been using any antiemetic. Would love to administer IM promethazine in the clinic however the patient has very little muscle mass and is reluctant -  promethazine (PHENERGAN) 25 MG suppository; Place 1 suppository (25 mg total) rectally every 6 (six) hours as needed for nausea or vomiting.  Dispense: 30 suppository; Refill: 2  4. Diarrhea due to malabsorption Attempted insertion of IV to provide some rehydration -was unsuccessful. Previous workup at [facility] revealed multiple etiologies-history of chronic C. difficile, malabsorption, pancreatic insufficiency Patient will like to have a second opinion - Ambulatory referral to Gastroenterology  5. Anxiety state - hydrOXYzine (ATARAX/VISTARIL) 25 MG tablet; Take 1 tablet (25 mg total) by mouth 3 (three) times daily as needed.  Dispense: 90 tablet; Refill: 1  6. Tooth ache This is also affecting her ability to eat Advised to take 2 tabs of Tylenol No. 3 for pain. Scheduled to see Dentist tomorrow  Meds ordered this encounter  Medications  . promethazine (PHENERGAN) 25 MG suppository    Sig: Place 1 suppository (25 mg total) rectally every 6 (six) hours as needed for nausea or vomiting.    Dispense:  30 suppository    Refill:  2  . hydrOXYzine (ATARAX/VISTARIL) 25 MG tablet    Sig: Take 1 tablet (25 mg total) by mouth 3 (three) times daily as needed.    Dispense:  90 tablet    Refill:  1    Follow-up: Return in about 2 weeks (around 06/09/2015) for follow up of Diarrhea.   Arnoldo Morale MD

## 2015-05-27 ENCOUNTER — Encounter: Payer: Self-pay | Admitting: Family Medicine

## 2015-06-09 ENCOUNTER — Ambulatory Visit: Payer: Self-pay | Admitting: Family Medicine

## 2015-06-23 ENCOUNTER — Emergency Department (HOSPITAL_COMMUNITY): Payer: Medicaid Other

## 2015-06-23 ENCOUNTER — Encounter (HOSPITAL_COMMUNITY): Payer: Self-pay | Admitting: Emergency Medicine

## 2015-06-23 ENCOUNTER — Emergency Department (HOSPITAL_COMMUNITY)
Admission: EM | Admit: 2015-06-23 | Discharge: 2015-06-23 | Disposition: A | Discharge status: Home / Self Care | Payer: Medicaid Other | Attending: Emergency Medicine | Admitting: Emergency Medicine

## 2015-06-23 DIAGNOSIS — Z79891 Long term (current) use of opiate analgesic: Secondary | ICD-10-CM | POA: Insufficient documentation

## 2015-06-23 DIAGNOSIS — Z79899 Other long term (current) drug therapy: Secondary | ICD-10-CM | POA: Diagnosis not present

## 2015-06-23 DIAGNOSIS — K3184 Gastroparesis: Secondary | ICD-10-CM

## 2015-06-23 DIAGNOSIS — E1065 Type 1 diabetes mellitus with hyperglycemia: Secondary | ICD-10-CM | POA: Diagnosis not present

## 2015-06-23 DIAGNOSIS — R197 Diarrhea, unspecified: Secondary | ICD-10-CM | POA: Diagnosis present

## 2015-06-23 DIAGNOSIS — R739 Hyperglycemia, unspecified: Secondary | ICD-10-CM

## 2015-06-23 DIAGNOSIS — Z87891 Personal history of nicotine dependence: Secondary | ICD-10-CM | POA: Diagnosis not present

## 2015-06-23 DIAGNOSIS — E1043 Type 1 diabetes mellitus with diabetic autonomic (poly)neuropathy: Secondary | ICD-10-CM

## 2015-06-23 LAB — CBC WITH DIFFERENTIAL/PLATELET
Basophils Absolute: 0 10*3/uL (ref 0.0–0.1)
Basophils Relative: 1 %
Eosinophils Absolute: 0 10*3/uL (ref 0.0–0.7)
Eosinophils Relative: 1 %
HEMATOCRIT: 31.9 % — AB (ref 36.0–46.0)
HEMOGLOBIN: 11 g/dL — AB (ref 12.0–15.0)
LYMPHS ABS: 1.9 10*3/uL (ref 0.7–4.0)
Lymphocytes Relative: 43 %
MCH: 28.3 pg (ref 26.0–34.0)
MCHC: 34.5 g/dL (ref 30.0–36.0)
MCV: 82 fL (ref 78.0–100.0)
MONOS PCT: 4 %
Monocytes Absolute: 0.2 10*3/uL (ref 0.1–1.0)
NEUTROS ABS: 2.3 10*3/uL (ref 1.7–7.7)
NEUTROS PCT: 51 %
Platelets: 327 10*3/uL (ref 150–400)
RBC: 3.89 MIL/uL (ref 3.87–5.11)
RDW: 16.4 % — ABNORMAL HIGH (ref 11.5–15.5)
WBC: 4.4 10*3/uL (ref 4.0–10.5)

## 2015-06-23 LAB — CBG MONITORING, ED
GLUCOSE-CAPILLARY: 302 mg/dL — AB (ref 65–99)
Glucose-Capillary: 418 mg/dL — ABNORMAL HIGH (ref 65–99)

## 2015-06-23 LAB — COMPREHENSIVE METABOLIC PANEL
ALK PHOS: 78 U/L (ref 38–126)
ALT: 50 U/L (ref 14–54)
ANION GAP: 11 (ref 5–15)
AST: 45 U/L — ABNORMAL HIGH (ref 15–41)
Albumin: 4.3 g/dL (ref 3.5–5.0)
BILIRUBIN TOTAL: 0.4 mg/dL (ref 0.3–1.2)
BUN: 17 mg/dL (ref 6–20)
CALCIUM: 8.8 mg/dL — AB (ref 8.9–10.3)
CO2: 23 mmol/L (ref 22–32)
Chloride: 100 mmol/L — ABNORMAL LOW (ref 101–111)
Creatinine, Ser: 0.61 mg/dL (ref 0.44–1.00)
GFR calc non Af Amer: >60 mL/min (ref >60)
Glucose, Bld: 323 mg/dL — ABNORMAL HIGH (ref 65–99)
Potassium: 4.3 mmol/L (ref 3.5–5.1)
Sodium: 134 mmol/L — ABNORMAL LOW (ref 135–145)
TOTAL PROTEIN: 7.4 g/dL (ref 6.5–8.1)

## 2015-06-23 LAB — I-STAT BETA HCG BLOOD, ED (MC, WL, AP ONLY)

## 2015-06-23 LAB — LIPASE, BLOOD: Lipase: 11 U/L (ref 11–51)

## 2015-06-23 MED ORDER — PROMETHAZINE HCL 25 MG PO TABS
25.0000 mg | ORAL_TABLET | Freq: Four times a day (QID) | ORAL | Status: DC | PRN
Start: 2015-06-23 — End: 2015-07-11

## 2015-06-23 MED ORDER — PROCHLORPERAZINE EDISYLATE 5 MG/ML IJ SOLN
10.0000 mg | Freq: Once | INTRAMUSCULAR | Status: AC
  Administered 2015-06-23: 10 mg via INTRAVENOUS
  Filled 2015-06-23: qty 2

## 2015-06-23 MED ORDER — SODIUM CHLORIDE 0.9 % IV BOLUS (SEPSIS)
1000.0000 mL | Freq: Once | INTRAVENOUS | Status: AC
  Administered 2015-06-23: 1000 mL via INTRAVENOUS

## 2015-06-23 MED ORDER — HYDROMORPHONE HCL 1 MG/ML IJ SOLN
1.0000 mg | Freq: Once | INTRAMUSCULAR | Status: AC
  Administered 2015-06-23: 1 mg via INTRAVENOUS
  Filled 2015-06-23: qty 1

## 2015-06-23 MED ORDER — INSULIN ASPART 100 UNIT/ML IV SOLN
10.0000 [IU] | Freq: Once | INTRAVENOUS | Status: DC
  Filled 2015-06-23: qty 0.1

## 2015-06-23 MED ORDER — METRONIDAZOLE 500 MG PO TABS: 500.0000 mg | ORAL_TABLET | Freq: Three times a day (TID) | ORAL | Status: DC

## 2015-06-23 MED ORDER — METRONIDAZOLE 500 MG PO TABS
500.0000 mg | ORAL_TABLET | Freq: Once | ORAL | Status: AC
  Administered 2015-06-23: 500 mg via ORAL
  Filled 2015-06-23: qty 1

## 2015-06-23 NOTE — ED Notes (Signed)
Aware we need a urine and stool specimen. Unable to go at this time.

## 2015-06-23 NOTE — Progress Notes (Signed)
Pt with [facility] 9 ED visits and 3 admissions in the last 6 months  Pt with next appt - entered in d/c instructions Arnoldo Morale, MD Go on 06/30/2015 You have a scheduled appt to see Dr Jarold Song on 06/30/15 at [place] [place] [address] [phone] No ED CP at this time (not >10 visits)

## 2015-06-23 NOTE — Discharge Instructions (Signed)
Take flagyl three times daily for 10 days.   Stay hydrated.   Take phenergan for nausea.   See your doctor.   Return to ER if you have worse abdominal pain, vomiting, fever, uncontrolled diarrhea.

## 2015-06-23 NOTE — ED Notes (Signed)
PATIENT WANTS THE NURSE TO COLLECT BLOOD WHEN THEY START THE IV

## 2015-06-23 NOTE — ED Provider Notes (Signed)
CSN: [id number]     Arrival date & time 06/23/15  1314 History   First MD Initiated Contact with Patient 06/23/15 1400     Chief Complaint  Patient presents with  . Abdominal Pain  . Diarrhea     (Consider location/radiation/quality/duration/timing/severity/associated sxs/prior Treatment) The history is provided by the patient.  [name] is a 35 y.o. female hx of DM, Gastroparesis, C. difficile here presenting with diarrhea, abdominal pain. Patient started having C. difficile about 2 years ago and have recurrent C. difficile most recently 2 weeks ago. She was admitted to [facility] And finished 3 days of vancomycin. She was not discharged home with any antibiotics and has been having persistent diarrhea. Yesterday she had about 20 episodes of watery diarrhea. Also has severe lower abdominal cramps. She feels nauseated but had no vomiting or fevers. She had multiple CTs in the last year or so and did not reveal any complications from colitis.       Past Medical History  Diagnosis Date  . Diabetes mellitus without complication ([place])     TYPE I   Past Surgical History  Procedure Laterality Date  . Laparoscopy  02/2002, 06/2010    laparoscopy with lysis pelvic adhesions for pelvic pain  . Back surgery      age 45  . Tooth extraction    . Root canal  10/10/12  . Flexible sigmoidoscopy N/A 06/24/2014    Procedure: FLEXIBLE SIGMOIDOSCOPY;  Surgeon: Milus Banister, MD;  Location: [facility] ENDOSCOPY;  Service: Endoscopy;  Laterality: N/A;  . Esophagogastroduodenoscopy (egd) with propofol N/A 06/27/2014    Procedure: ESOPHAGOGASTRODUODENOSCOPY (EGD) WITH PROPOFOL;  Surgeon: Milus Banister, MD;  Location: [facility] ENDOSCOPY;  Service: Endoscopy;  Laterality: N/A;   Family History  Problem Relation Age of Onset  . Diabetes Maternal Grandmother   . Cancer Maternal Grandmother     Leukemia  . Cancer Maternal Grandfather     Small cell lung cancer   Social History  Substance Use Topics  .  Smoking status: Former Smoker    Quit date: 04/19/2000  . Smokeless tobacco: Former Systems developer  . Alcohol Use: No     Comment: RARE SIPS OF WINE   OB History    No data available     Review of Systems  Gastrointestinal: Positive for abdominal pain and diarrhea.  All other systems reviewed and are negative.     Allergies  Scallops; Diphenhydramine hcl; Haloperidol and related; Ketorolac; Morphine and related; Toradol; Latex; and Other  Home Medications   Prior to Admission medications   Medication Sig Start Date End Date Taking? Authorizing Provider  acetaminophen (TYLENOL) 500 MG tablet Take 1,000 mg by mouth every 6 (six) hours as needed for moderate pain.    Yes Historical Provider, MD  acetaminophen-codeine (TYLENOL #3) 300-30 MG tablet Take 1 tablet by mouth every 12 (twelve) hours as needed for moderate pain. 04/30/15  Yes Arnoldo Morale, MD  ALPRAZolam Duanne Moron) 1 MG tablet Take 1 mg by mouth 2 (two) times daily as needed for anxiety.   Yes Historical Provider, MD  amitriptyline (ELAVIL) 50 MG tablet Take 2 tablets (100 mg total) by mouth at bedtime. 08/07/14  Yes Arnoldo Morale, MD  CALCIUM PO Take 2 tablets by mouth daily.   Yes Historical Provider, MD  cholestyramine Lucrezia Starch) 4 G packet Take 1 packet (4 g total) by mouth 2 (two) times daily. Patient taking differently: Take 4 g by mouth 4 (four) times daily.  09/19/14  Yes Okey Regal, PA-C  CREON 36000 UNITS CPEP capsule Take 4-6 capsules by mouth. Takes 6 capsules with every meal and 4 capsules with every snack. 12/02/14  Yes Historical Provider, MD  cyanocobalamin 500 MCG tablet Take 2,000 mcg by mouth 2 (two) times daily as needed (mouth sores).    Yes Historical Provider, MD  dicyclomine (BENTYL) 10 MG capsule Take 10 mg by mouth 4 (four) times daily -  before meals and at bedtime. Reported on 04/01/2015   Yes Historical Provider, MD  diphenoxylate-atropine (LOMOTIL) 2.5-0.025 MG per tablet Take 1 tablet by mouth 4 (four) times  daily as needed for diarrhea or loose stools. 11/08/14  Yes Orpah Greek, MD  feeding supplement, GLUCERNA SHAKE, (GLUCERNA SHAKE) LIQD Take 237 mLs by mouth 3 (three) times daily between meals. Patient taking differently: Take 237 mLs by mouth 4 (four) times daily.  07/06/14  Yes Thurnell Lose, MD  gabapentin (NEURONTIN) 100 MG capsule Take 300 mg by mouth at bedtime.    Yes Historical Provider, MD  glucagon (GLUCAGON EMERGENCY) 1 MG injection Inject 1 mg into the muscle once as needed (severe hypoglycemia). 08/14/14  Yes Arnoldo Morale, MD  guaifenesin (ROBITUSSIN) 100 MG/5ML syrup Take 200 mg by mouth 3 (three) times daily as needed for cough.   Yes Historical Provider, MD  HYDROcodone-acetaminophen (NORCO/VICODIN) 5-325 MG tablet Take 1 tablet by mouth every 4 (four) hours as needed (dental pain).  04/23/15  Yes Historical Provider, MD  hydrOXYzine (ATARAX/VISTARIL) 25 MG tablet Take 1 tablet (25 mg total) by mouth 3 (three) times daily as needed. 05/26/15  Yes Arnoldo Morale, MD  insulin aspart (NOVOLOG FLEXPEN) 100 UNIT/ML FlexPen Inject 0-5 Units into the skin 3 (three) times daily as needed for high blood sugar.    Yes Historical Provider, MD  insulin glargine (LANTUS) 100 UNIT/ML injection Inject 0.07-0.08 mLs (7-8 Units total) into the skin 2 (two) times daily. 8 units in the morning and 7 units at night Patient taking differently: Inject 7-9 Units into the skin 2 (two) times daily. 9 units in the morning and 7 units at night 01/15/15  Yes Velvet Bathe, MD  Multiple Vitamin (MULTIVITAMIN WITH MINERALS) TABS Take 2 tablets by mouth daily. Diabetic support vitamin   Yes Historical Provider, MD  omeprazole (PRILOSEC) 40 MG capsule Take 40 mg by mouth daily as needed (indigestion).    Yes Historical Provider, MD  Probiotic Product (DIGESTIVE ADVANTAGE GUMMIES PO) Take 2 tablets by mouth 2 (two) times daily.   Yes Historical Provider, MD  prochlorperazine (COMPAZINE) 10 MG tablet Take 1 tablet  (10 mg total) by mouth 2 (two) times daily as needed for nausea or vomiting. 05/05/15  Yes Maryann Mikhail, DO  promethazine (PHENERGAN) 25 MG suppository Place 1 suppository (25 mg total) rectally every 6 (six) hours as needed for nausea or vomiting. 05/26/15  Yes Arnoldo Morale, MD  promethazine (PHENERGAN) 25 MG tablet Take 1 tablet (25 mg total) by mouth every 6 (six) hours as needed for nausea or vomiting. 03/27/15  Yes Nicole Pisciotta, PA-C  saccharomyces boulardii (FLORASTOR) 250 MG capsule Take 1 capsule (250 mg total) by mouth 2 (two) times daily. 07/06/14  Yes Thurnell Lose, MD  traZODone (DESYREL) 50 MG tablet Take 1 tablet (50 mg total) by mouth at bedtime as needed for sleep. 04/01/15  Yes Arnoldo Morale, MD  Eluxadoline (VIBERZI) 100 MG TABS Take 100 mg by mouth 2 (two) times daily. Reported on 04/01/2015    Historical  Provider, MD   BP 105/73 mmHg  Pulse 91  Temp(Src) 98.2 F (36.8 C) (Oral)  Resp 16  Ht 5\' 5"  (1.651 m)  Wt 85 lb (38.556 kg)  BMI 14.14 kg/m2  SpO2 100%  LMP  Physical Exam  Constitutional: She is oriented to person, place, and time.  Older than stated age, dehydrated   HENT:  Head: Normocephalic.  MM dry   Eyes: Conjunctivae are normal. Pupils are equal, round, and reactive to light.  Neck: Normal range of motion. Neck supple.  Cardiovascular: Normal rate, regular rhythm and normal heart sounds.   Pulmonary/Chest: Effort normal and breath sounds normal. No respiratory distress. She has no wheezes. She has no rales.  Abdominal: Soft. Bowel sounds are normal.  + mild diffuse tenderness, no rebound   Musculoskeletal: Normal range of motion. She exhibits no edema or tenderness.  Neurological: She is alert and oriented to person, place, and time. No cranial nerve deficit. Coordination normal.  Skin: Skin is warm and dry.  Psychiatric: She has a normal mood and affect. Her behavior is normal. Judgment and thought content normal.  Nursing note and vitals  reviewed.   ED Course  Procedures (including critical care time) Labs Review Labs Reviewed  CBC WITH DIFFERENTIAL/PLATELET - Abnormal; Notable for the following:    Hemoglobin 11.0 (*)    HCT 31.9 (*)    RDW 16.4 (*)    All other components within normal limits  COMPREHENSIVE METABOLIC PANEL - Abnormal; Notable for the following:    Sodium 134 (*)    Chloride 100 (*)    Glucose, Bld 323 (*)    Calcium 8.8 (*)    AST 45 (*)    All other components within normal limits  CBG MONITORING, ED - Abnormal; Notable for the following:    Glucose-Capillary 302 (*)    All other components within normal limits  CBG MONITORING, ED - Abnormal; Notable for the following:    Glucose-Capillary 418 (*)    All other components within normal limits  C DIFFICILE QUICK SCREEN W PCR REFLEX  LIPASE, BLOOD  URINALYSIS, ROUTINE W REFLEX MICROSCOPIC (NOT AT [facility])  I-STAT BETA HCG BLOOD, ED ([facility], [facility], [facility] ONLY)    Imaging Review Dg Abd Acute W/chest  06/23/2015  CLINICAL DATA:  Recurrent C diff colitis, abdominal pain, diarrhea EXAM: DG ABDOMEN ACUTE W/ 1V CHEST COMPARISON:  05/18/2015 FINDINGS: Cardiomediastinal silhouette is stable. No acute infiltrate or pleural effusion. No pulmonary edema. Bony thorax is unremarkable. There is normal small bowel gas pattern. No free abdominal air. IMPRESSION: Negative abdominal radiographs.  No acute cardiopulmonary disease. Electronically Signed   By: Lahoma Crocker M.D.   On: 06/23/2015 14:41   I have personally reviewed and evaluated these images and lab results as part of my medical decision-making.   EKG Interpretation None      MDM   Final diagnoses:  None    [name] is a 35 y.o. female here with lower abdominal pain, diarrhea. Consider C diff vs viral. Had multiple CTs this year that was unremarkable. Will get labs, C diff. Will hydrate and give pain meds and reassess.   7:40 PM Patient observed in the ED for 6 hours. Initial CBG 300, ate food so  went up to 418. Received 1 L NS bolus. Ordered insulin but patient refused since she states that she is sensitive to insulin. AG normal. Tolerated food well. No bowel movement for 6 hrs in the ED. xrays unremarkable.  WBC nl. I think likely viral vs C diff. Given recurrent C diff, will start flagyl empirically.    Wandra Arthurs, MD 06/23/15 [phone]

## 2015-06-23 NOTE — ED Notes (Signed)
IV attempted by this rn. Unsuccessful another RN to attempt.

## 2015-06-23 NOTE — ED Notes (Signed)
Patient here with complaints of abd pain, diarrhea. Reports being admitted for C-diff in March, "I think it is back".

## 2015-06-30 ENCOUNTER — Ambulatory Visit: Payer: Self-pay | Admitting: Family Medicine

## 2015-07-04 ENCOUNTER — Emergency Department (HOSPITAL_COMMUNITY)
Admission: EM | Admit: 2015-07-04 | Discharge: 2015-07-04 | Disposition: A | Discharge status: Home / Self Care | Payer: Medicaid Other | Source: Home / Self Care | Attending: Emergency Medicine | Admitting: Emergency Medicine

## 2015-07-04 ENCOUNTER — Encounter (HOSPITAL_COMMUNITY): Payer: Self-pay | Admitting: Emergency Medicine

## 2015-07-04 DIAGNOSIS — N390 Urinary tract infection, site not specified: Secondary | ICD-10-CM | POA: Insufficient documentation

## 2015-07-04 DIAGNOSIS — R739 Hyperglycemia, unspecified: Secondary | ICD-10-CM

## 2015-07-04 DIAGNOSIS — Z79891 Long term (current) use of opiate analgesic: Secondary | ICD-10-CM

## 2015-07-04 DIAGNOSIS — Z91040 Latex allergy status: Secondary | ICD-10-CM

## 2015-07-04 DIAGNOSIS — Z79899 Other long term (current) drug therapy: Secondary | ICD-10-CM

## 2015-07-04 DIAGNOSIS — Z87891 Personal history of nicotine dependence: Secondary | ICD-10-CM | POA: Insufficient documentation

## 2015-07-04 DIAGNOSIS — E1065 Type 1 diabetes mellitus with hyperglycemia: Secondary | ICD-10-CM | POA: Insufficient documentation

## 2015-07-04 DIAGNOSIS — Z792 Long term (current) use of antibiotics: Secondary | ICD-10-CM | POA: Insufficient documentation

## 2015-07-04 LAB — CBG MONITORING, ED
Glucose-Capillary: 408 mg/dL — ABNORMAL HIGH (ref 65–99)
Glucose-Capillary: 209 mg/dL — ABNORMAL HIGH (ref 65–99)

## 2015-07-04 LAB — CBC
HCT: 38.9 % (ref 36.0–46.0)
Hemoglobin: 13.8 g/dL (ref 12.0–15.0)
MCH: 28.9 pg (ref 26.0–34.0)
MCHC: 35.5 g/dL (ref 30.0–36.0)
MCV: 81.6 fL (ref 78.0–100.0)
Platelets: 179 10*3/uL (ref 150–400)
RBC: 4.77 MIL/uL (ref 3.87–5.11)
RDW: 16.2 % — ABNORMAL HIGH (ref 11.5–15.5)
WBC: 6.1 10*3/uL (ref 4.0–10.5)

## 2015-07-04 LAB — URINALYSIS, ROUTINE W REFLEX MICROSCOPIC
Bilirubin Urine: NEGATIVE
Glucose, UA: >1000 mg/dL — AB
Hgb urine dipstick: NEGATIVE
Ketones, ur: NEGATIVE mg/dL
Nitrite: NEGATIVE
Protein, ur: NEGATIVE mg/dL
Specific Gravity, Urine: 1.03 (ref 1.005–1.030)
pH: 8.5 — ABNORMAL HIGH (ref 5.0–8.0)

## 2015-07-04 LAB — BASIC METABOLIC PANEL
Anion gap: 21 — ABNORMAL HIGH (ref 5–15)
BUN: 14 mg/dL (ref 6–20)
CO2: 22 mmol/L (ref 22–32)
Calcium: 10.2 mg/dL (ref 8.9–10.3)
Chloride: 93 mmol/L — ABNORMAL LOW (ref 101–111)
Creatinine, Ser: 0.77 mg/dL (ref 0.44–1.00)
GFR calc Af Amer: >60 mL/min (ref >60)
GFR calc non Af Amer: >60 mL/min (ref >60)
Glucose, Bld: 457 mg/dL — ABNORMAL HIGH (ref 65–99)
Potassium: 3.6 mmol/L (ref 3.5–5.1)
Sodium: 136 mmol/L (ref 135–145)

## 2015-07-04 LAB — URINE MICROSCOPIC-ADD ON

## 2015-07-04 LAB — C DIFFICILE QUICK SCREEN W PCR REFLEX
C Diff antigen: NEGATIVE
C Diff interpretation: NEGATIVE
C Diff toxin: NEGATIVE

## 2015-07-04 MED ORDER — PROCHLORPERAZINE EDISYLATE 5 MG/ML IJ SOLN
2.5000 mg | Freq: Once | INTRAMUSCULAR | Status: AC
  Administered 2015-07-04: 2.5 mg via INTRAVENOUS
  Filled 2015-07-04: qty 2

## 2015-07-04 MED ORDER — DEXTROSE 5 % IV SOLN
1.0000 g | Freq: Once | INTRAVENOUS | Status: AC
  Administered 2015-07-04: 1 g via INTRAVENOUS
  Filled 2015-07-04: qty 10

## 2015-07-04 MED ORDER — HYDROMORPHONE HCL 1 MG/ML IJ SOLN
0.5000 mg | Freq: Once | INTRAMUSCULAR | Status: AC
  Administered 2015-07-04: 0.5 mg via INTRAVENOUS
  Filled 2015-07-04: qty 1

## 2015-07-04 MED ORDER — INSULIN ASPART 100 UNIT/ML ~~LOC~~ SOLN
3.0000 [IU] | Freq: Once | SUBCUTANEOUS | Status: DC
  Filled 2015-07-04: qty 1

## 2015-07-04 MED ORDER — CEPHALEXIN 500 MG PO CAPS: 500.0000 mg | ORAL_CAPSULE | Freq: Three times a day (TID) | ORAL | Status: DC

## 2015-07-04 MED ORDER — PROMETHAZINE HCL 25 MG RE SUPP
25.0000 mg | Freq: Once | RECTAL | Status: AC
  Administered 2015-07-04: 25 mg via RECTAL
  Filled 2015-07-04: qty 1

## 2015-07-04 MED ORDER — PROCHLORPERAZINE EDISYLATE 5 MG/ML IJ SOLN
5.0000 mg | Freq: Once | INTRAMUSCULAR | Status: AC
  Administered 2015-07-04: 5 mg via INTRAVENOUS
  Filled 2015-07-04: qty 2

## 2015-07-04 MED ORDER — SODIUM CHLORIDE 0.9 % IV BOLUS (SEPSIS)
1000.0000 mL | Freq: Once | INTRAVENOUS | Status: AC
  Administered 2015-07-04: 1000 mL via INTRAVENOUS

## 2015-07-04 MED ORDER — HYDROMORPHONE HCL 1 MG/ML IJ SOLN
0.5000 mg | INTRAMUSCULAR | Status: DC | PRN
  Administered 2015-07-04: 0.5 mg via INTRAVENOUS
  Filled 2015-07-04: qty 1

## 2015-07-04 MED ORDER — INSULIN ASPART 100 UNIT/ML ~~LOC~~ SOLN
2.0000 [IU] | Freq: Once | SUBCUTANEOUS | Status: AC
  Administered 2015-07-04: 2 [IU] via INTRAVENOUS

## 2015-07-04 NOTE — ED Notes (Signed)
Made Dr Wilson Singer aware of patient/mother wanting to speak with him about insulin before administration

## 2015-07-04 NOTE — ED Notes (Signed)
RN attempting lab draw.

## 2015-07-04 NOTE — Progress Notes (Signed)
Spoke with [facility] CM Jessica Pt has a 07/11/15 appt  Entered in d/c instructions  Amao, Enobong Go on 07/11/2015 You have a scheduled appt on 07/11/15 at 1045 with Dr Jarold Song [address] [place] [address] [phone]

## 2015-07-04 NOTE — Discharge Instructions (Signed)

## 2015-07-04 NOTE — ED Notes (Signed)
Set up bedside commode for patient to use to collect specimens.  Patient states that she can't at this time.

## 2015-07-04 NOTE — Hospital Discharge Follow-Up (Signed)
This Case Manager received communication from Joellyn Quails, RN CM at [facility] that patient needing ED follow-up appointment. Record reviewed, patient has appointment scheduled on 07/11/15 at 1045 with Dr. Jarold Song. Joellyn Quails update and informed of appointment. No additional needs identified.

## 2015-07-04 NOTE — ED Provider Notes (Signed)
CSN: [id number]     Arrival date & time 07/04/15  [id number] History   First MD Initiated Contact with Patient 07/04/15 [phone]     Chief Complaint  Patient presents with  . Hyperglycemia     (Consider location/radiation/quality/duration/timing/severity/associated sxs/prior Treatment) HPI   35 year old female with abdominal pain and nausea and vomiting. She has a past history of type 1 diabetes. She is concerned that she may be DKA. She reports wide variations in her blood glucose the past several days. She gives ranges from the 40s to "too high to read." She is followed by endocrinology at [facility]. She was seen yesterday and started on insulin pump. Dosing as below.  Her abdominal pain is diffuse. Multiple episodes of vomiting. Also has been having increasing diarrhea. Recent diagnosis of cdiff. She was on flagyl but only took for a couple days because she couldn't tolerate the GI side effects.   Intervention/Plan  Pump Settings established as follows:  Current/Average TDD: 37 units Starting basal doses: 12am: 0.5, 6am: 0.7, 10pm: 0.5 units/hr Total Basal: 15.2 Units I:C ratios: 1 unit of insulin per 25 gram of CHO. Patient was taking 1 unit per 30 grams of carb, 500 formula calls for I:C of 1:13.5 . Discussed with Dr. Paticia Stack and obtained verbal order for 1:25 to start. ISF: 12am: 100 mg/dl. 1700 rule calls for ISF 50, but patient was taking 1 units per 100 starting at 300 mg/dl. Obtained verbal order to start at 100 mg/dl Targets: 12am: 180(200), 6am: 150(180), 10pm: 180(200) Insulin on board time: 6 Hours   Off Pump Plan: current MDI dosing . Lantus 10 units am, 7units pm, I:C ratio: 1:30, correction 1 unit per 100 over 200, starts correcting at 300 due to significant sensitivity.    Past Medical History  Diagnosis Date  . Diabetes mellitus without complication ([place])     TYPE I   Past Surgical History  Procedure Laterality Date  . Laparoscopy  02/2002, 06/2010    laparoscopy with lysis  pelvic adhesions for pelvic pain  . Back surgery      age 35  . Tooth extraction    . Root canal  10/10/12  . Flexible sigmoidoscopy N/A 06/24/2014    Procedure: FLEXIBLE SIGMOIDOSCOPY;  Surgeon: Milus Banister, MD;  Location: [facility] ENDOSCOPY;  Service: Endoscopy;  Laterality: N/A;  . Esophagogastroduodenoscopy (egd) with propofol N/A 06/27/2014    Procedure: ESOPHAGOGASTRODUODENOSCOPY (EGD) WITH PROPOFOL;  Surgeon: Milus Banister, MD;  Location: [facility] ENDOS[facility];  Service: Endoscopy;  Laterality: N/A;   Family History  Problem Relation Age of Onset  . Diabetes Maternal Grandmother   . Cancer Maternal Grandmother     Leukemia  . Cancer Maternal Grandfather     Small cell lung cancer   Social History  Substance Use Topics  . Smoking status: Former Smoker    Quit date: 04/19/2000  . Smokeless tobacco: Former Systems developer  . Alcohol Use: No     Comment: RARE SIPS OF WINE   OB History    No data available     Review of Systems  All systems reviewed and negative, other than as noted in HPI.   Allergies  Scallops; Diphenhydramine hcl; Haloperidol and related; Ketorolac; Morphine and related; Toradol; Latex; and Other  Home Medications   Prior to Admission medications   Medication Sig Start Date End Date Taking? Authorizing Provider  acetaminophen (TYLENOL) 500 MG tablet Take 1,000 mg by mouth every 6 (six) hours as needed for moderate pain.  Historical Provider, MD  acetaminophen-codeine (TYLENOL #3) 300-30 MG tablet Take 1 tablet by mouth every 12 (twelve) hours as needed for moderate pain. 04/30/15   Arnoldo Morale, MD  ALPRAZolam Duanne Moron) 1 MG tablet Take 1 mg by mouth 2 (two) times daily as needed for anxiety.    Historical Provider, MD  amitriptyline (ELAVIL) 50 MG tablet Take 2 tablets (100 mg total) by mouth at bedtime. 08/07/14   Arnoldo Morale, MD  CALCIUM PO Take 2 tablets by mouth daily.    Historical Provider, MD  cholestyramine Lucrezia Starch) 4 G packet Take 1 packet (4 g total) by mouth 2  (two) times daily. Patient taking differently: Take 4 g by mouth 4 (four) times daily.  09/19/14   Jeffrey Hedges, PA-C  CREON 36000 UNITS CPEP capsule Take 4-6 capsules by mouth. Takes 6 capsules with every meal and 4 capsules with every snack. 12/02/14   Historical Provider, MD  cyanocobalamin 500 MCG tablet Take 2,000 mcg by mouth 2 (two) times daily as needed (mouth sores).     Historical Provider, MD  dicyclomine (BENTYL) 10 MG capsule Take 10 mg by mouth 4 (four) times daily -  before meals and at bedtime. Reported on 04/01/2015    Historical Provider, MD  diphenoxylate-atropine (LOMOTIL) 2.5-0.025 MG per tablet Take 1 tablet by mouth 4 (four) times daily as needed for diarrhea or loose stools. 11/08/14   Orpah Greek, MD  Eluxadoline (VIBERZI) 100 MG TABS Take 100 mg by mouth 2 (two) times daily. Reported on 04/01/2015    Historical Provider, MD  feeding supplement, GLUCERNA SHAKE, (GLUCERNA SHAKE) LIQD Take 237 mLs by mouth 3 (three) times daily between meals. Patient taking differently: Take 237 mLs by mouth 4 (four) times daily.  07/06/14   Thurnell Lose, MD  gabapentin (NEURONTIN) 100 MG capsule Take 300 mg by mouth at bedtime.     Historical Provider, MD  glucagon (GLUCAGON EMERGENCY) 1 MG injection Inject 1 mg into the muscle once as needed (severe hypoglycemia). 08/14/14   Arnoldo Morale, MD  guaifenesin (ROBITUSSIN) 100 MG/5ML syrup Take 200 mg by mouth 3 (three) times daily as needed for cough.    Historical Provider, MD  HYDROcodone-acetaminophen (NORCO/VICODIN) 5-325 MG tablet Take 1 tablet by mouth every 4 (four) hours as needed (dental pain).  04/23/15   Historical Provider, MD  hydrOXYzine (ATARAX/VISTARIL) 25 MG tablet Take 1 tablet (25 mg total) by mouth 3 (three) times daily as needed. 05/26/15   Arnoldo Morale, MD  insulin aspart (NOVOLOG FLEXPEN) 100 UNIT/ML FlexPen Inject 0-5 Units into the skin 3 (three) times daily as needed for high blood sugar.     Historical Provider, MD   insulin glargine (LANTUS) 100 UNIT/ML injection Inject 0.07-0.08 mLs (7-8 Units total) into the skin 2 (two) times daily. 8 units in the morning and 7 units at night Patient taking differently: Inject 7-9 Units into the skin 2 (two) times daily. 9 units in the morning and 7 units at night 01/15/15   Velvet Bathe, MD  metroNIDAZOLE (FLAGYL) 500 MG tablet Take 1 tablet (500 mg total) by mouth 3 (three) times daily. 06/23/15   Wandra Arthurs, MD  Multiple Vitamin (MULTIVITAMIN WITH MINERALS) TABS Take 2 tablets by mouth daily. Diabetic support vitamin    Historical Provider, MD  omeprazole (PRILOSEC) 40 MG capsule Take 40 mg by mouth daily as needed (indigestion).     Historical Provider, MD  Probiotic Product (DIGESTIVE ADVANTAGE GUMMIES PO) Take 2 tablets by  mouth 2 (two) times daily.    Historical Provider, MD  prochlorperazine (COMPAZINE) 10 MG tablet Take 1 tablet (10 mg total) by mouth 2 (two) times daily as needed for nausea or vomiting. 05/05/15   Maryann Mikhail, DO  promethazine (PHENERGAN) 25 MG suppository Place 1 suppository (25 mg total) rectally every 6 (six) hours as needed for nausea or vomiting. 05/26/15   Arnoldo Morale, MD  promethazine (PHENERGAN) 25 MG tablet Take 1 tablet (25 mg total) by mouth every 6 (six) hours as needed for nausea or vomiting. 06/23/15   Wandra Arthurs, MD  saccharomyces boulardii (FLORASTOR) 250 MG capsule Take 1 capsule (250 mg total) by mouth 2 (two) times daily. 07/06/14   Thurnell Lose, MD  traZODone (DESYREL) 50 MG tablet Take 1 tablet (50 mg total) by mouth at bedtime as needed for sleep. 04/01/15   Arnoldo Morale, MD   BP 109/79 mmHg  Pulse 127  Resp 30  SpO2 100% Physical Exam  Constitutional: She appears well-developed and well-nourished. No distress.  HENT:  Head: Normocephalic and atraumatic.  Eyes: Conjunctivae are normal. Right eye exhibits no discharge. Left eye exhibits no discharge.  Neck: Neck supple.  Cardiovascular: Regular rhythm and normal heart  sounds.  Exam reveals no gallop and no friction rub.   No murmur heard. tachycardic  Pulmonary/Chest: Effort normal and breath sounds normal. No respiratory distress.  Abdominal: Soft. She exhibits no distension. There is tenderness.  Mild diffuse tenderness  Musculoskeletal: She exhibits no edema or tenderness.  Neurological: She is alert.  Skin: Skin is warm and dry.  Psychiatric: She has a normal mood and affect. Her behavior is normal. Thought content normal.  Nursing note and vitals reviewed.   ED Course  Procedures (including critical care time) Labs Review Labs Reviewed  BASIC METABOLIC PANEL - Abnormal; Notable for the following:    Chloride 93 (*)    Glucose, Bld 457 (*)    Anion gap 21 (*)    All other components within normal limits  CBC - Abnormal; Notable for the following:    RDW 16.2 (*)    All other components within normal limits  URINALYSIS, ROUTINE W REFLEX MICROSCOPIC (NOT AT [facility]) - Abnormal; Notable for the following:    APPearance CLOUDY (*)    pH 8.5 (*)    Glucose, UA >1000 (*)    Leukocytes, UA MODERATE (*)    All other components within normal limits  URINE MICROSCOPIC-ADD ON - Abnormal; Notable for the following:    Squamous Epithelial / LPF 0-5 (*)    Bacteria, UA MANY (*)    All other components within normal limits  CBG MONITORING, ED - Abnormal; Notable for the following:    Glucose-Capillary 408 (*)    All other components within normal limits  CBG MONITORING, ED - Abnormal; Notable for the following:    Glucose-Capillary 209 (*)    All other components within normal limits  C DIFFICILE QUICK SCREEN W PCR REFLEX  URINE CULTURE    Imaging Review No results found. I have personally reviewed and evaluated these images and lab results as part of my medical decision-making.   EKG Interpretation None      MDM   Final diagnoses:  Hyperglycemia  UTI (lower urinary tract infection)    She is hyperglycemic with increased anion gap,  but not acidotic and no ketones on UA. She is concerned about insulin ordered. She is very insulin sensitive. Detailed endocrinology note from yesterday,  but correction recommendations are 1u for every 100 over 200. Given this. Insulin improved as are symptoms. I feel that she can be safely discharged at this time but needs to call her endocrinologist as soon as she can to discuss further.    Virgel Manifold, MD 07/09/15 [id number]

## 2015-07-04 NOTE — Progress Notes (Signed)
Pt with 9 [facility] visits in last 6 months 3 admission Not a [facility] candidate Referral to [facility] CM for 9 ED visits- Dr Jarold Song  No ED CP

## 2015-07-04 NOTE — ED Notes (Signed)
Per pt, states she thinks she is in DKA-vomiting that started last night

## 2015-07-05 ENCOUNTER — Encounter (HOSPITAL_COMMUNITY): Payer: Self-pay | Admitting: *Deleted

## 2015-07-05 ENCOUNTER — Inpatient Hospital Stay (HOSPITAL_COMMUNITY): Payer: Medicaid Other

## 2015-07-05 ENCOUNTER — Inpatient Hospital Stay (HOSPITAL_COMMUNITY)
Admission: EM | Admit: 2015-07-05 | Discharge: 2015-07-08 | DRG: 638 | Disposition: A | Discharge status: Home / Self Care | Payer: Medicaid Other | Attending: Internal Medicine | Admitting: Internal Medicine

## 2015-07-05 DIAGNOSIS — Z885 Allergy status to narcotic agent status: Secondary | ICD-10-CM | POA: Diagnosis not present

## 2015-07-05 DIAGNOSIS — Z886 Allergy status to analgesic agent status: Secondary | ICD-10-CM

## 2015-07-05 DIAGNOSIS — Z9641 Presence of insulin pump (external) (internal): Secondary | ICD-10-CM | POA: Diagnosis present

## 2015-07-05 DIAGNOSIS — Z79899 Other long term (current) drug therapy: Secondary | ICD-10-CM

## 2015-07-05 DIAGNOSIS — R112 Nausea with vomiting, unspecified: Secondary | ICD-10-CM | POA: Diagnosis present

## 2015-07-05 DIAGNOSIS — Z833 Family history of diabetes mellitus: Secondary | ICD-10-CM

## 2015-07-05 DIAGNOSIS — Z888 Allergy status to other drugs, medicaments and biological substances status: Secondary | ICD-10-CM

## 2015-07-05 DIAGNOSIS — Z801 Family history of malignant neoplasm of trachea, bronchus and lung: Secondary | ICD-10-CM

## 2015-07-05 DIAGNOSIS — R109 Unspecified abdominal pain: Secondary | ICD-10-CM

## 2015-07-05 DIAGNOSIS — N390 Urinary tract infection, site not specified: Secondary | ICD-10-CM | POA: Diagnosis present

## 2015-07-05 DIAGNOSIS — K3184 Gastroparesis: Secondary | ICD-10-CM | POA: Diagnosis present

## 2015-07-05 DIAGNOSIS — Z794 Long term (current) use of insulin: Secondary | ICD-10-CM

## 2015-07-05 DIAGNOSIS — E0810 Diabetes mellitus due to underlying condition with ketoacidosis without coma: Secondary | ICD-10-CM | POA: Diagnosis not present

## 2015-07-05 DIAGNOSIS — E109 Type 1 diabetes mellitus without complications: Secondary | ICD-10-CM

## 2015-07-05 DIAGNOSIS — R740 Nonspecific elevation of levels of transaminase and lactic acid dehydrogenase [LDH]: Secondary | ICD-10-CM | POA: Diagnosis present

## 2015-07-05 DIAGNOSIS — Z91040 Latex allergy status: Secondary | ICD-10-CM

## 2015-07-05 DIAGNOSIS — Z806 Family history of leukemia: Secondary | ICD-10-CM | POA: Diagnosis not present

## 2015-07-05 DIAGNOSIS — E1043 Type 1 diabetes mellitus with diabetic autonomic (poly)neuropathy: Secondary | ICD-10-CM | POA: Diagnosis present

## 2015-07-05 DIAGNOSIS — B961 Klebsiella pneumoniae [K. pneumoniae] as the cause of diseases classified elsewhere: Secondary | ICD-10-CM | POA: Diagnosis present

## 2015-07-05 DIAGNOSIS — Z1611 Resistance to penicillins: Secondary | ICD-10-CM | POA: Diagnosis present

## 2015-07-05 DIAGNOSIS — Z91013 Allergy to seafood: Secondary | ICD-10-CM | POA: Diagnosis not present

## 2015-07-05 DIAGNOSIS — E1010 Type 1 diabetes mellitus with ketoacidosis without coma: Secondary | ICD-10-CM | POA: Diagnosis present

## 2015-07-05 DIAGNOSIS — E1110 Type 2 diabetes mellitus with ketoacidosis without coma: Secondary | ICD-10-CM | POA: Diagnosis present

## 2015-07-05 LAB — LIPASE, BLOOD: Lipase: 15 U/L (ref 11–51)

## 2015-07-05 LAB — CBC
HEMATOCRIT: 33.8 % — AB (ref 36.0–46.0)
HEMOGLOBIN: 11.5 g/dL — AB (ref 12.0–15.0)
MCH: 29 pg (ref 26.0–34.0)
MCHC: 34 g/dL (ref 30.0–36.0)
MCV: 85.1 fL (ref 78.0–100.0)
Platelets: 158 10*3/uL (ref 150–400)
RBC: 3.97 MIL/uL (ref 3.87–5.11)
RDW: 16.5 % — ABNORMAL HIGH (ref 11.5–15.5)
WBC: 7.3 10*3/uL (ref 4.0–10.5)

## 2015-07-05 LAB — BASIC METABOLIC PANEL
ANION GAP: 13 (ref 5–15)
Anion gap: 16 — ABNORMAL HIGH (ref 5–15)
BUN: 16 mg/dL (ref 6–20)
BUN: 17 mg/dL (ref 6–20)
CHLORIDE: 107 mmol/L (ref 101–111)
CHLORIDE: 105 mmol/L (ref 101–111)
CO2: 19 mmol/L — ABNORMAL LOW (ref 22–32)
CO2: 16 mmol/L — ABNORMAL LOW (ref 22–32)
Calcium: 7.7 mg/dL — ABNORMAL LOW (ref 8.9–10.3)
Calcium: 8 mg/dL — ABNORMAL LOW (ref 8.9–10.3)
Creatinine, Ser: 0.74 mg/dL (ref 0.44–1.00)
Creatinine, Ser: 0.68 mg/dL (ref 0.44–1.00)
GFR calc Af Amer: >60 mL/min (ref >60)
GFR calc Af Amer: >60 mL/min (ref >60)
GFR calc non Af Amer: >60 mL/min (ref >60)
GFR calc non Af Amer: >60 mL/min (ref >60)
GLUCOSE: 231 mg/dL — AB (ref 65–99)
Glucose, Bld: 280 mg/dL — ABNORMAL HIGH (ref 65–99)
POTASSIUM: 3 mmol/L — AB (ref 3.5–5.1)
POTASSIUM: 3.4 mmol/L — AB (ref 3.5–5.1)
SODIUM: 137 mmol/L (ref 135–145)
Sodium: 139 mmol/L (ref 135–145)

## 2015-07-05 LAB — COMPREHENSIVE METABOLIC PANEL
ALBUMIN: 5 g/dL (ref 3.5–5.0)
ALT: 141 U/L — AB (ref 14–54)
ANION GAP: 22 — AB (ref 5–15)
AST: 192 U/L — ABNORMAL HIGH (ref 15–41)
Alkaline Phosphatase: 149 U/L — ABNORMAL HIGH (ref 38–126)
BUN: 16 mg/dL (ref 6–20)
CHLORIDE: 94 mmol/L — AB (ref 101–111)
CO2: 18 mmol/L — AB (ref 22–32)
CREATININE: 0.86 mg/dL (ref 0.44–1.00)
Calcium: 9.8 mg/dL (ref 8.9–10.3)
GFR calc non Af Amer: >60 mL/min (ref >60)
GLUCOSE: 377 mg/dL — AB (ref 65–99)
Potassium: 3.2 mmol/L — ABNORMAL LOW (ref 3.5–5.1)
SODIUM: 134 mmol/L — AB (ref 135–145)
Total Bilirubin: 1.1 mg/dL (ref 0.3–1.2)
Total Protein: 8.1 g/dL (ref 6.5–8.1)

## 2015-07-05 LAB — CBG MONITORING, ED
GLUCOSE-CAPILLARY: 278 mg/dL — AB (ref 65–99)
GLUCOSE-CAPILLARY: 365 mg/dL — AB (ref 65–99)

## 2015-07-05 LAB — BLOOD GAS, VENOUS
ACID-BASE DEFICIT: 4.1 mmol/L — AB (ref 0.0–2.0)
Acid-Base Excess: 0.4 mmol/L (ref 0.0–2.0)
Bicarbonate: 20.3 mEq/L (ref 20.0–24.0)
Bicarbonate: 18.5 mEq/L — ABNORMAL LOW (ref 20.0–24.0)
FIO2: 0.21
FIO2: 0.21
O2 SAT: 69.9 %
O2 Saturation: 54.3 %
PATIENT TEMPERATURE: 98.6
PCO2 VEN: 20.3 mmHg — AB (ref 45.0–50.0)
PH VEN: 7.604 — AB (ref 7.250–7.300)
Patient temperature: 98.6
TCO2: 17.8 mmol/L (ref 0–100)
TCO2: 16.5 mmol/L (ref 0–100)
pCO2, Ven: 28.1 mmHg — ABNORMAL LOW (ref 45.0–50.0)
pH, Ven: 7.434 — ABNORMAL HIGH (ref 7.250–7.300)
pO2, Ven: 39.2 mmHg (ref 31.0–45.0)

## 2015-07-05 LAB — HCG, QUANTITATIVE, PREGNANCY

## 2015-07-05 LAB — MRSA PCR SCREENING: MRSA BY PCR: NEGATIVE

## 2015-07-05 LAB — GLUCOSE, CAPILLARY
GLUCOSE-CAPILLARY: 234 mg/dL — AB (ref 65–99)
GLUCOSE-CAPILLARY: 155 mg/dL — AB (ref 65–99)
GLUCOSE-CAPILLARY: 161 mg/dL — AB (ref 65–99)
GLUCOSE-CAPILLARY: 262 mg/dL — AB (ref 65–99)
GLUCOSE-CAPILLARY: 285 mg/dL — AB (ref 65–99)

## 2015-07-05 MED ORDER — PROCHLORPERAZINE EDISYLATE 5 MG/ML IJ SOLN
10.0000 mg | Freq: Once | INTRAMUSCULAR | Status: AC
  Administered 2015-07-05: 10 mg via INTRAVENOUS
  Filled 2015-07-05: qty 2

## 2015-07-05 MED ORDER — POTASSIUM CHLORIDE CRYS ER 20 MEQ PO TBCR
40.0000 meq | EXTENDED_RELEASE_TABLET | Freq: Once | ORAL | Status: AC
  Administered 2015-07-06: 40 meq via ORAL
  Filled 2015-07-05: qty 2

## 2015-07-05 MED ORDER — ENOXAPARIN SODIUM 40 MG/0.4ML ~~LOC~~ SOLN
40.0000 mg | SUBCUTANEOUS | Status: DC
  Administered 2015-07-05 – 2015-07-07 (×3): 40 mg via SUBCUTANEOUS
  Filled 2015-07-05 (×3): qty 0.4

## 2015-07-05 MED ORDER — CHOLESTYRAMINE 4 G PO PACK
4.0000 g | PACK | Freq: Two times a day (BID) | ORAL | Status: DC
  Administered 2015-07-06 – 2015-07-07 (×4): 4 g via ORAL
  Filled 2015-07-05 (×9): qty 1

## 2015-07-05 MED ORDER — TRAZODONE HCL 50 MG PO TABS
50.0000 mg | ORAL_TABLET | Freq: Every evening | ORAL | Status: DC | PRN
Start: 2015-07-05 — End: 2015-07-08
  Administered 2015-07-06: 50 mg via ORAL
  Filled 2015-07-05: qty 1

## 2015-07-05 MED ORDER — INSULIN REGULAR HUMAN 100 UNIT/ML IJ SOLN
INTRAMUSCULAR | Status: DC
  Administered 2015-07-05: 3.1 [IU]/h via INTRAVENOUS
  Filled 2015-07-05: qty 2.5

## 2015-07-05 MED ORDER — ONDANSETRON HCL 4 MG/2ML IJ SOLN: 4.0000 mg | Freq: Once | INTRAMUSCULAR | Status: DC

## 2015-07-05 MED ORDER — DEXTROSE-NACL 5-0.45 % IV SOLN
INTRAVENOUS | Status: DC
  Administered 2015-07-05: 17:00:00 via INTRAVENOUS

## 2015-07-05 MED ORDER — SODIUM CHLORIDE 0.9 % IV SOLN: INTRAVENOUS | Status: DC

## 2015-07-05 MED ORDER — POTASSIUM CHLORIDE 10 MEQ/100ML IV SOLN
10.0000 meq | INTRAVENOUS | Status: AC
  Administered 2015-07-05: 10 meq via INTRAVENOUS
  Filled 2015-07-05: qty 100

## 2015-07-05 MED ORDER — ONDANSETRON HCL 4 MG/2ML IJ SOLN
4.0000 mg | Freq: Four times a day (QID) | INTRAMUSCULAR | Status: DC | PRN
  Administered 2015-07-06 – 2015-07-08 (×7): 4 mg via INTRAVENOUS
  Filled 2015-07-05 (×7): qty 2

## 2015-07-05 MED ORDER — HYDROMORPHONE HCL 1 MG/ML IJ SOLN
1.0000 mg | Freq: Once | INTRAMUSCULAR | Status: DC
  Filled 2015-07-05: qty 1

## 2015-07-05 MED ORDER — METOCLOPRAMIDE HCL 5 MG/ML IJ SOLN
10.0000 mg | Freq: Once | INTRAMUSCULAR | Status: AC
  Administered 2015-07-05: 10 mg via INTRAVENOUS
  Filled 2015-07-05: qty 2

## 2015-07-05 MED ORDER — HYDROMORPHONE HCL 1 MG/ML IJ SOLN
1.0000 mg | Freq: Once | INTRAMUSCULAR | Status: AC
  Administered 2015-07-05: 1 mg via INTRAVENOUS

## 2015-07-05 MED ORDER — SODIUM CHLORIDE 0.9 % IV SOLN
INTRAVENOUS | Status: DC
  Administered 2015-07-05: 17:00:00 via INTRAVENOUS

## 2015-07-05 MED ORDER — HYDROMORPHONE HCL 1 MG/ML IJ SOLN
1.0000 mg | INTRAMUSCULAR | Status: DC | PRN
  Administered 2015-07-05 – 2015-07-08 (×19): 1 mg via INTRAVENOUS
  Filled 2015-07-05 (×20): qty 1

## 2015-07-05 MED ORDER — POTASSIUM CHLORIDE 10 MEQ/100ML IV SOLN
10.0000 meq | INTRAVENOUS | Status: AC
  Filled 2015-07-05: qty 100

## 2015-07-05 MED ORDER — PROCHLORPERAZINE EDISYLATE 5 MG/ML IJ SOLN
10.0000 mg | Freq: Once | INTRAMUSCULAR | Status: AC
Start: 2015-07-05 — End: 2015-07-06
  Administered 2015-07-06: 10 mg via INTRAVENOUS
  Filled 2015-07-05: qty 2

## 2015-07-05 MED ORDER — POTASSIUM CHLORIDE CRYS ER 20 MEQ PO TBCR: 40.0000 meq | EXTENDED_RELEASE_TABLET | Freq: Once | ORAL | Status: DC

## 2015-07-05 MED ORDER — AMITRIPTYLINE HCL 25 MG PO TABS
100.0000 mg | ORAL_TABLET | Freq: Every day | ORAL | Status: DC
  Administered 2015-07-05 – 2015-07-07 (×3): 100 mg via ORAL
  Filled 2015-07-05 (×3): qty 4

## 2015-07-05 MED ORDER — METOCLOPRAMIDE HCL 5 MG/ML IJ SOLN
5.0000 mg | Freq: Three times a day (TID) | INTRAMUSCULAR | Status: DC
  Administered 2015-07-05 – 2015-07-07 (×5): 5 mg via INTRAVENOUS
  Filled 2015-07-05 (×5): qty 2

## 2015-07-05 MED ORDER — SODIUM CHLORIDE 0.9 % IV SOLN
INTRAVENOUS | Status: AC
  Administered 2015-07-05: 16:00:00 via INTRAVENOUS

## 2015-07-05 MED ORDER — SODIUM CHLORIDE 0.9 % IV SOLN
INTRAVENOUS | Status: DC
  Administered 2015-07-05: 12:00:00 via INTRAVENOUS

## 2015-07-05 MED ORDER — HYDROMORPHONE HCL 1 MG/ML IJ SOLN
1.0000 mg | Freq: Once | INTRAMUSCULAR | Status: AC
  Administered 2015-07-05: 1 mg via INTRAVENOUS
  Filled 2015-07-05: qty 1

## 2015-07-05 MED ORDER — SODIUM CHLORIDE 0.9 % IV BOLUS (SEPSIS)
1000.0000 mL | Freq: Once | INTRAVENOUS | Status: AC
  Administered 2015-07-05: 1000 mL via INTRAVENOUS

## 2015-07-05 MED ORDER — INSULIN PUMP
SUBCUTANEOUS | Status: DC
  Filled 2015-07-05: qty 1

## 2015-07-05 MED ORDER — POTASSIUM CHLORIDE 10 MEQ/100ML IV SOLN: 10.0000 meq | Freq: Once | INTRAVENOUS | Status: DC

## 2015-07-05 NOTE — ED Notes (Signed)
Pt is unable to urinate at this time. 

## 2015-07-05 NOTE — ED Provider Notes (Signed)
CSN: [id number]     Arrival date & time 07/05/15  1100 History   First MD Initiated Contact with Patient 07/05/15 1137     Chief Complaint  Patient presents with  . Hyperglycemia  . Urinary Tract Infection  . Hematemesis     (Consider location/radiation/quality/duration/timing/severity/associated sxs/prior Treatment) HPI   Patient is a 35 year old female with past medical history of type 1 diabetes who presents the ED with complaint of hyperglycemia. Patient reports she was seen in the ED yesterday for her hyperglycemia, was treated in the ED, dx with UTI and sent home with antibiotics. She notes her glucose has had wide variations over the past few days and notes this morning it was 498 and states her insulin pump take her 2 units of insulin prior to arrival. Patient reports having associated diffuse abdominal pain, nausea, vomiting, diarrhea and SOB. She reports noticing a small amount of bright red streaky blood in her emesis this morning. Patient reports she has had diarrhea for the past year. She notes she has not been able to keep any fluids down at home since being discharged from the hospital and she notes her symptoms have worsened since she was seen in the ED yesterday. Pt reports "I feel like I am in DKA again". She states she was last admitted for DKA appx. 2-3 months ago. Endorses associated subjective fever. Denies cough, CP, wheezing, urinary sxs, lightheadedness, dizziness, syncope, seizure.  Pt reports she was recently started on an insulin pump with the following plan:  Intervention/Plan  Pump Settings established as follows:  Current/Average TDD: 37 units Starting basal doses: 12am: 0.5, 6am: 0.7, 10pm: 0.5 units/hr Total Basal: 15.2 Units I:C ratios: 1 unit of insulin per 25 gram of CHO. Patient was taking 1 unit per 30 grams of carb, 500 formula calls for I:C of 1:13.5 . Discussed with Dr. Paticia Stack and obtained verbal order for 1:25 to start. ISF: 12am: 100 mg/dl. 1700  rule calls for ISF 50, but patient was taking 1 units per 100 starting at 300 mg/dl. Obtained verbal order to start at 100 mg/dl Targets: 12am: 180(200), 6am: 150(180), 10pm: 180(200) Insulin on board time: 6 Hours   Off Pump Plan: current MDI dosing . Lantus 10 units am, 7units pm, I:C ratio: 1:30, correction 1 unit per 100 over 200, starts correcting at 300 due to significant sensitivity.   Endocrinologist Dr. Paticia Stack at [facility].   Past Medical History  Diagnosis Date  . Diabetes mellitus without complication ([place])     TYPE I   Past Surgical History  Procedure Laterality Date  . Laparoscopy  02/2002, 06/2010    laparoscopy with lysis pelvic adhesions for pelvic pain  . Back surgery      age 29  . Tooth extraction    . Root canal  10/10/12  . Flexible sigmoidoscopy N/A 06/24/2014    Procedure: FLEXIBLE SIGMOIDOSCOPY;  Surgeon: Milus Banister, MD;  Location: [facility];  Service: Endoscopy;  Laterality: N/A;  . Esophagogastroduodenoscopy (egd) with propofol N/A 06/27/2014    Procedure: ESOPHAGOGASTRODUODENOSCOPY (EGD) WITH PROPOFOL;  Surgeon: Milus Banister, MD;  Location: [facility]OS[facility];  Service: Endoscopy;  Laterality: N/A;   Family History  Problem Relation Age of Onset  . Diabetes Maternal Grandmother   . Cancer Maternal Grandmother     Leukemia  . Cancer Maternal Grandfather     Small cell lung cancer   Social History  Substance Use Topics  . Smoking status: Former Smoker    Quit date:  04/19/2000  . Smokeless tobacco: Former Systems developer  . Alcohol Use: No     Comment: RARE SIPS OF WINE   OB History    No data available     Review of Systems  Constitutional: Positive for fever (subjective).  Respiratory: Positive for shortness of breath.   Gastrointestinal: Positive for nausea, vomiting, abdominal pain and diarrhea.  All other systems reviewed and are negative.     Allergies  Scallops; Diphenhydramine hcl; Haloperidol and related; Ketorolac; Morphine and related;  Toradol; Latex; and Other  Home Medications   Prior to Admission medications   Medication Sig Start Date End Date Taking? Authorizing Provider  acetaminophen (TYLENOL) 500 MG tablet Take 1,000 mg by mouth every 6 (six) hours as needed for moderate pain.    Yes Historical Provider, MD  acetaminophen-codeine (TYLENOL #3) 300-30 MG tablet Take 1 tablet by mouth every 12 (twelve) hours as needed for moderate pain. 04/30/15  Yes Arnoldo Morale, MD  ALPRAZolam Duanne Moron) 1 MG tablet Take 1 mg by mouth 2 (two) times daily as needed for anxiety.   Yes Historical Provider, MD  amitriptyline (ELAVIL) 50 MG tablet Take 2 tablets (100 mg total) by mouth at bedtime. 08/07/14  Yes Arnoldo Morale, MD  CALCIUM PO Take 2 tablets by mouth daily.   Yes Historical Provider, MD  cholestyramine Lucrezia Starch) 4 G packet Take 1 packet (4 g total) by mouth 2 (two) times daily. Patient taking differently: Take 4 g by mouth 4 (four) times daily.  09/19/14  Yes Jeffrey Hedges, PA-C  CREON 36000 UNITS CPEP capsule Take 4-6 capsules by mouth. Takes 6 capsules with every meal and 4 capsules with every snack. 12/02/14  Yes Historical Provider, MD  cyanocobalamin 500 MCG tablet Take 2,000 mcg by mouth 2 (two) times daily as needed (mouth sores).    Yes Historical Provider, MD  cyclobenzaprine (FLEXERIL) 10 MG tablet Take 10 mg by mouth 2 (two) times daily as needed. Dental problems 06/19/15  Yes Historical Provider, MD  dicyclomine (BENTYL) 10 MG capsule Take 10 mg by mouth 4 (four) times daily -  before meals and at bedtime. Reported on 04/01/2015   Yes Historical Provider, MD  diphenoxylate-atropine (LOMOTIL) 2.5-0.025 MG per tablet Take 1 tablet by mouth 4 (four) times daily as needed for diarrhea or loose stools. 11/08/14  Yes Orpah Greek, MD  feeding supplement, GLUCERNA SHAKE, (GLUCERNA SHAKE) LIQD Take 237 mLs by mouth 3 (three) times daily between meals. Patient taking differently: Take 237 mLs by mouth 4 (four) times daily.   07/06/14  Yes Thurnell Lose, MD  gabapentin (NEURONTIN) 100 MG capsule Take 300 mg by mouth at bedtime.    Yes Historical Provider, MD  glucagon (GLUCAGON EMERGENCY) 1 MG injection Inject 1 mg into the muscle once as needed (severe hypoglycemia). 08/14/14  Yes Arnoldo Morale, MD  guaifenesin (ROBITUSSIN) 100 MG/5ML syrup Take 200 mg by mouth 3 (three) times daily as needed for cough.   Yes Historical Provider, MD  HYDROcodone-acetaminophen (NORCO/VICODIN) 5-325 MG tablet Take 1 tablet by mouth every 4 (four) hours as needed (dental pain).  04/23/15  Yes Historical Provider, MD  hydrOXYzine (ATARAX/VISTARIL) 25 MG tablet Take 1 tablet (25 mg total) by mouth 3 (three) times daily as needed. Patient taking differently: Take 25 mg by mouth 3 (three) times daily as needed for itching.  05/26/15  Yes Arnoldo Morale, MD  Insulin Infusion Pump DEVI Inject 16 Units into the skin daily. Plus bolus 1-5 units per meal,  and correction factor 1 unit to every 100/200   Yes Historical Provider, MD  insulin lispro (HUMALOG) 100 UNIT/ML injection Inject 16 Units into the skin daily. Plus bolus 1-5 units per meal, and correction factor 1 unit to every 100/200   Yes Historical Provider, MD  metroNIDAZOLE (FLAGYL) 500 MG tablet Take 1 tablet (500 mg total) by mouth 3 (three) times daily. 06/23/15  Yes Wandra Arthurs, MD  Multiple Vitamin (MULTIVITAMIN WITH MINERALS) TABS Take 2 tablets by mouth daily. Diabetic support vitamin   Yes Historical Provider, MD  omeprazole (PRILOSEC) 40 MG capsule Take 40 mg by mouth daily as needed (indigestion).    Yes Historical Provider, MD  Probiotic Product (DIGESTIVE ADVANTAGE GUMMIES PO) Take 2 tablets by mouth 2 (two) times daily.   Yes Historical Provider, MD  prochlorperazine (COMPAZINE) 10 MG tablet Take 1 tablet (10 mg total) by mouth 2 (two) times daily as needed for nausea or vomiting. 05/05/15  Yes Maryann Mikhail, DO  promethazine (PHENERGAN) 25 MG suppository Place 1 suppository (25 mg  total) rectally every 6 (six) hours as needed for nausea or vomiting. 05/26/15  Yes Arnoldo Morale, MD  promethazine (PHENERGAN) 25 MG tablet Take 1 tablet (25 mg total) by mouth every 6 (six) hours as needed for nausea or vomiting. 06/23/15  Yes Wandra Arthurs, MD  saccharomyces boulardii (FLORASTOR) 250 MG capsule Take 1 capsule (250 mg total) by mouth 2 (two) times daily. 07/06/14  Yes Thurnell Lose, MD  traZODone (DESYREL) 50 MG tablet Take 1 tablet (50 mg total) by mouth at bedtime as needed for sleep. 04/01/15  Yes Arnoldo Morale, MD  cephALEXin (KEFLEX) 500 MG capsule Take 1 capsule (500 mg total) by mouth 3 (three) times daily. Patient not taking: Reported on 07/05/2015 07/04/15   Virgel Manifold, MD  Eluxadoline (VIBERZI) 100 MG TABS Take 100 mg by mouth 2 (two) times daily. Reported on 07/05/2015    Historical Provider, MD  insulin glargine (LANTUS) 100 UNIT/ML injection Inject 0.07-0.08 mLs (7-8 Units total) into the skin 2 (two) times daily. 8 units in the morning and 7 units at night Patient not taking: Reported on 07/04/2015 01/15/15   Velvet Bathe, MD   BP 105/75 mmHg  Pulse 114  Temp(Src) 98.7 F (37.1 C) (Oral)  Resp 15  Ht 5\' 5"  (1.651 m)  Wt 38.556 kg  BMI 14.14 kg/m2  SpO2 100% Physical Exam  Constitutional: She is oriented to person, place, and time. She appears well-developed and well-nourished.  HENT:  Head: Normocephalic and atraumatic.  Mouth/Throat: Uvula is midline and oropharynx is clear and moist. Mucous membranes are dry. No oropharyngeal exudate, posterior oropharyngeal edema, posterior oropharyngeal erythema or tonsillar abscesses.  Eyes: Conjunctivae and EOM are normal. Right eye exhibits no discharge. Left eye exhibits no discharge. No scleral icterus.  Neck: Normal range of motion. Neck supple.  Cardiovascular: Regular rhythm, normal heart sounds and intact distal pulses.   Tachycardic, HR 120  Pulmonary/Chest: Breath sounds normal. She has no wheezes. She has no rales.  She exhibits no tenderness.  Kussmual respirations noted  Abdominal: Soft. Bowel sounds are normal. She exhibits no distension and no mass. There is tenderness (diffuse abdominal tenderness). There is no rebound and no guarding.  Musculoskeletal: Normal range of motion. She exhibits no edema.  Lymphadenopathy:    She has no cervical adenopathy.  Neurological: She is alert and oriented to person, place, and time.  Skin: Skin is warm and dry.  Nursing note and  vitals reviewed.   ED Course  .Critical Care Performed by: Nona Dell Authorized by: Nona Dell Total critical care time: 30 minutes Critical care was necessary to treat or prevent imminent or life-threatening deterioration of the following conditions: metabolic crisis (DKA). Critical care was time spent personally by me on the following activities: blood draw for specimens, development of treatment plan with patient or surrogate, discussions with consultants, interpretation of cardiac output measurements, evaluation of patient's response to treatment, examination of patient, obtaining history from patient or surrogate, ordering and performing treatments and interventions, ordering and review of laboratory studies, pulse oximetry, re-evaluation of patient's condition and review of old charts.   (including critical care time) Labs Review Labs Reviewed  CBC - Abnormal; Notable for the following:    Hemoglobin 11.5 (*)    HCT 33.8 (*)    RDW 16.5 (*)    All other components within normal limits  COMPREHENSIVE METABOLIC PANEL - Abnormal; Notable for the following:    Sodium 134 (*)    Potassium 3.2 (*)    Chloride 94 (*)    CO2 18 (*)    Glucose, Bld 377 (*)    AST 192 (*)    ALT 141 (*)    Alkaline Phosphatase 149 (*)    Anion gap 22 (*)    All other components within normal limits  BLOOD GAS, VENOUS - Abnormal; Notable for the following:    pH, Ven 7.604 (*)    pCO2, Ven 20.3 (*)    All other  components within normal limits  BLOOD GAS, VENOUS - Abnormal; Notable for the following:    pH, Ven 7.434 (*)    pCO2, Ven 28.1 (*)    Bicarbonate 18.5 (*)    Acid-base deficit 4.1 (*)    All other components within normal limits  CBG MONITORING, ED - Abnormal; Notable for the following:    Glucose-Capillary 278 (*)    All other components within normal limits  CBG MONITORING, ED - Abnormal; Notable for the following:    Glucose-Capillary 365 (*)    All other components within normal limits  LIPASE, BLOOD  URINALYSIS, ROUTINE W REFLEX MICROSCOPIC (NOT AT [facility])    Imaging Review No results found. I have personally reviewed and evaluated these images and lab results as part of my medical decision-making.   EKG Interpretation None      MDM   Final diagnoses:  Diabetic ketoacidosis without coma associated with type 1 diabetes mellitus ([place])    Pt presents with hyperglycemia and worsening N/V and abdominal pain. She notes she was seen in the ED yesterday for the same sxs, given IVF and antibiotics for UTI and d/c home. Pt reports sxs worsened over night. Hx of type 1 DM. Pt had a new insulin pump placed 2 days ago. Afebrile. HR 129. BP 95/58, RR 20, 100% on RA. On exam, pt tachypneic with kussmual respirations, dry mucous membranes, diffuse abdominal tenderness, no peritoneal signs, lungs CTAB. CBG 278. Pt given IVF, pain meds and compazine and orders placed for CBC, CMP, UA, lipase and VBG.   Discussed case with Dr. Eulis Foster.   Glucose 377. Anion gap 22. K 3.2. CO2 18. PH 7.6. Pt given IV potassium and IVF infusion. On reevaluation, pt's respiratory rate decreased, HR dec to 102. She still endorses having nausea and abdominal pain. Orders placed for second dose of medications. Consulted endocrinology at [facility]. Dr. Gae Bon advised to have pt admitted for DKA and start pt  on insulin drip at 0.25 mg/kg. Consulted hospitalist for admission. Dr. Coralyn Pear agrees to admission. Discussed results  and plan for admission with pt and family.     Chesley Noon [place], [place] 07/05/15 [place], MD 07/05/15 1755

## 2015-07-05 NOTE — ED Provider Notes (Signed)
  Face-to-face evaluation   History: She presents for evaluation of nausea, vomiting, and hyperglycemia. Is also having chronic diarrhea. She is in the ED yesterday and treated with IV fluids, antibiotics, for UTI. She was newly started on insulin pump, and that was maintained despite the recommendation of her endocrinologist, who the patient's mother contacted yesterday while she was in the emergency department, with the recommendation to start her Lantus last evening. At 1300 hrs., now, in the ED, the mother is removing the insulin pump. Patient states she is still having abdominal pain, and vomiting. At home she was unable to keep a Phenergan suppository and, so is not getting her emesis controlled.  Physical exam: Alert, cooperative. She is lucid. Mucous membranes are somewhat dry.  Medical screening examination/treatment/procedure(s) were conducted as a shared visit with non-physician practitioner(s) and myself.  I personally evaluated the patient during the encounter  Daleen Bo, MD 07/05/15 1755

## 2015-07-05 NOTE — ED Notes (Signed)
ATTEMPT X 2 FOR 2ND LINE BY PATTY DOWN RN NOT SUCCESSFUL

## 2015-07-05 NOTE — H&P (Signed)
History and Physical    [name] [id number] DOB: 20-Jan-1981 DOA: 07/05/2015  PCP: Arnoldo Morale, MD  Patient coming from: Home  Chief Complaint: Nausea/vomiting  HPI: [name] is a 35 y.o. female with medical history significant of type 1 diabetes mellitus having prior hospitalizations for diabetic ketoacidosis, was last admitted on 05/03/2015 through 05/05/2015 for hyperosmolar nonketotic state, presents to the emergency department with complaints of nausea and vomiting. She states having multiple episodes of nausea and vomiting over the past 3 days, inability to hold by mouth down. She also reports generalized abdominal pain as well as diarrhea along with generalized weakness, fatigue, and overall feeling ill. She states that current symptoms similar to prior episodes of diabetic ketoacidosis. She has an endocrinologist Dr Paticia Stack who she follows. She is also currently on an insulin pump. She denies fevers, chills, dysuria, hematuria, syncope, presyncope.  ED Course: In the emergency department she was found to have a blood sugar of 377 with anion gap of 22. Bicarbonate 18. She was started on IV insulin.  Review of Systems: As per HPI otherwise 10 point review of systems negative.   Past Medical History  Diagnosis Date  . Diabetes mellitus without complication ([place])     TYPE I    Past Surgical History  Procedure Laterality Date  . Laparoscopy  02/2002, 06/2010    laparoscopy with lysis pelvic adhesions for pelvic pain  . Back surgery      age 61  . Tooth extraction    . Root canal  10/10/12  . Flexible sigmoidoscopy N/A 06/24/2014    Procedure: FLEXIBLE SIGMOIDOSCOPY;  Surgeon: Milus Banister, MD;  Location: [facility];  Service: Endoscopy;  Laterality: N/A;  . Esophagogastroduodenoscopy (egd) with propofol N/A 06/27/2014    Procedure: ESOPHAGOGASTRODUODENOSCOPY (EGD) WITH PROPOFOL;  Surgeon: Milus Banister, MD;  Location: [facility];  Service: Endoscopy;   Laterality: N/A;     reports that she quit smoking about 15 years ago. She has quit using smokeless tobacco. She reports that she does not drink alcohol or use illicit drugs.  Allergies  Allergen Reactions  . Scallops [Shellfish Allergy] Anaphylaxis  . Diphenhydramine Hcl Nausea Only    IV diphenhydramine.  Oral capsules ok.    . Haloperidol And Related Itching    Pt wanted to rip skin off  . Ketorolac Itching  . Morphine And Related Itching  . Toradol [Ketorolac Tromethamine]     Pt wanted to rip skin off  . Latex Rash  . Other Rash    Sunscreen and grass    Family History  Problem Relation Age of Onset  . Diabetes Maternal Grandmother   . Cancer Maternal Grandmother     Leukemia  . Cancer Maternal Grandfather     Small cell lung cancer    Prior to Admission medications   Medication Sig Start Date End Date Taking? Authorizing Provider  acetaminophen (TYLENOL) 500 MG tablet Take 1,000 mg by mouth every 6 (six) hours as needed for moderate pain.    Yes Historical Provider, MD  acetaminophen-codeine (TYLENOL #3) 300-30 MG tablet Take 1 tablet by mouth every 12 (twelve) hours as needed for moderate pain. 04/30/15  Yes Arnoldo Morale, MD  ALPRAZolam Duanne Moron) 1 MG tablet Take 1 mg by mouth 2 (two) times daily as needed for anxiety.   Yes Historical Provider, MD  amitriptyline (ELAVIL) 50 MG tablet Take 2 tablets (100 mg total) by mouth at bedtime. 08/07/14  Yes Arnoldo Morale, MD  CALCIUM PO Take 2 tablets by mouth daily.   Yes Historical Provider, MD  cholestyramine Lucrezia Starch) 4 G packet Take 1 packet (4 g total) by mouth 2 (two) times daily. Patient taking differently: Take 4 g by mouth 4 (four) times daily.  09/19/14  Yes Jeffrey Hedges, PA-C  CREON 36000 UNITS CPEP capsule Take 4-6 capsules by mouth. Takes 6 capsules with every meal and 4 capsules with every snack. 12/02/14  Yes Historical Provider, MD  cyanocobalamin 500 MCG tablet Take 2,000 mcg by mouth 2 (two) times daily as needed  (mouth sores).    Yes Historical Provider, MD  cyclobenzaprine (FLEXERIL) 10 MG tablet Take 10 mg by mouth 2 (two) times daily as needed. Dental problems 06/19/15  Yes Historical Provider, MD  dicyclomine (BENTYL) 10 MG capsule Take 10 mg by mouth 4 (four) times daily -  before meals and at bedtime. Reported on 04/01/2015   Yes Historical Provider, MD  diphenoxylate-atropine (LOMOTIL) 2.5-0.025 MG per tablet Take 1 tablet by mouth 4 (four) times daily as needed for diarrhea or loose stools. 11/08/14  Yes Orpah Greek, MD  feeding supplement, GLUCERNA SHAKE, (GLUCERNA SHAKE) LIQD Take 237 mLs by mouth 3 (three) times daily between meals. Patient taking differently: Take 237 mLs by mouth 4 (four) times daily.  07/06/14  Yes Thurnell Lose, MD  gabapentin (NEURONTIN) 100 MG capsule Take 300 mg by mouth at bedtime.    Yes Historical Provider, MD  glucagon (GLUCAGON EMERGENCY) 1 MG injection Inject 1 mg into the muscle once as needed (severe hypoglycemia). 08/14/14  Yes Arnoldo Morale, MD  guaifenesin (ROBITUSSIN) 100 MG/5ML syrup Take 200 mg by mouth 3 (three) times daily as needed for cough.   Yes Historical Provider, MD  HYDROcodone-acetaminophen (NORCO/VICODIN) 5-325 MG tablet Take 1 tablet by mouth every 4 (four) hours as needed (dental pain).  04/23/15  Yes Historical Provider, MD  hydrOXYzine (ATARAX/VISTARIL) 25 MG tablet Take 1 tablet (25 mg total) by mouth 3 (three) times daily as needed. Patient taking differently: Take 25 mg by mouth 3 (three) times daily as needed for itching.  05/26/15  Yes Arnoldo Morale, MD  Insulin Infusion Pump DEVI Inject 16 Units into the skin daily. Plus bolus 1-5 units per meal, and correction factor 1 unit to every 100/200   Yes Historical Provider, MD  insulin lispro (HUMALOG) 100 UNIT/ML injection Inject 16 Units into the skin daily. Plus bolus 1-5 units per meal, and correction factor 1 unit to every 100/200   Yes Historical Provider, MD  metroNIDAZOLE (FLAGYL) 500 MG  tablet Take 1 tablet (500 mg total) by mouth 3 (three) times daily. 06/23/15  Yes Wandra Arthurs, MD  Multiple Vitamin (MULTIVITAMIN WITH MINERALS) TABS Take 2 tablets by mouth daily. Diabetic support vitamin   Yes Historical Provider, MD  omeprazole (PRILOSEC) 40 MG capsule Take 40 mg by mouth daily as needed (indigestion).    Yes Historical Provider, MD  Probiotic Product (DIGESTIVE ADVANTAGE GUMMIES PO) Take 2 tablets by mouth 2 (two) times daily.   Yes Historical Provider, MD  prochlorperazine (COMPAZINE) 10 MG tablet Take 1 tablet (10 mg total) by mouth 2 (two) times daily as needed for nausea or vomiting. 05/05/15  Yes Maryann Mikhail, DO  promethazine (PHENERGAN) 25 MG suppository Place 1 suppository (25 mg total) rectally every 6 (six) hours as needed for nausea or vomiting. 05/26/15  Yes Arnoldo Morale, MD  promethazine (PHENERGAN) 25 MG tablet Take 1 tablet (25 mg total) by mouth  every 6 (six) hours as needed for nausea or vomiting. 06/23/15  Yes Wandra Arthurs, MD  saccharomyces boulardii (FLORASTOR) 250 MG capsule Take 1 capsule (250 mg total) by mouth 2 (two) times daily. 07/06/14  Yes Thurnell Lose, MD  traZODone (DESYREL) 50 MG tablet Take 1 tablet (50 mg total) by mouth at bedtime as needed for sleep. 04/01/15  Yes Arnoldo Morale, MD  cephALEXin (KEFLEX) 500 MG capsule Take 1 capsule (500 mg total) by mouth 3 (three) times daily. Patient not taking: Reported on 07/05/2015 07/04/15   Virgel Manifold, MD  Eluxadoline (VIBERZI) 100 MG TABS Take 100 mg by mouth 2 (two) times daily. Reported on 07/05/2015    Historical Provider, MD  insulin glargine (LANTUS) 100 UNIT/ML injection Inject 0.07-0.08 mLs (7-8 Units total) into the skin 2 (two) times daily. 8 units in the morning and 7 units at night Patient not taking: Reported on 07/04/2015 01/15/15   Velvet Bathe, MD    Physical Exam: Filed Vitals:   07/05/15 1111 07/05/15 1205 07/05/15 1230  BP: 95/58 94/63 102/70  Pulse: 129 117 93  Temp: 98 F (36.7 C)      Resp: 20 18 16   Height: 5\' 5"  (1.651 m)    Weight: 38.556 kg (85 lb)    SpO2: 100% 100% 100%      Constitutional: ll-appearing, thin, in mild distress. Filed Vitals:   07/05/15 1111 07/05/15 1205 07/05/15 1230  BP: 95/58 94/63 102/70  Pulse: 129 117 93  Temp: 98 F (36.7 C)    Resp: 20 18 16   Height: 5\' 5"  (1.651 m)    Weight: 38.556 kg (85 lb)    SpO2: 100% 100% 100%   Eyes: PERRL, lids and conjunctivae normal ENMT: Mucous membranes are moist. Posterior pharynx clear of any exudate or lesions.Normal dentition.  Neck: normal, supple, no masses, no thyromegaly Respiratory: clear to auscultation bilaterally, no wheezing, no crackles. Normal respiratory effort. No accessory muscle use.  Cardiovascular: tachycardic,Regular rate and rhythm, no murmurs / rubs / gallops. No extremity edema. 2+ pedal pulses. No carotid bruits.  Abdomen: no tenderness, no masses palpated. No hepatosplenomegaly. Bowel sounds positive.  Musculoskeletal: no clubbing / cyanosis. No joint deformity upper and lower extremities. Good ROM, no contractures. Normal muscle tone.  Skin: no rashes, lesions, ulcers. No induration Neurologic: CN 2-12 grossly intact. Sensation intact, DTR normal. Strength 5/5 in all 4.  Psychiatric: Normal judgment and insight. Alert and oriented x 3. Normal mood.   Labs on Admission: I have personally reviewed following labs and imaging studies  CBC:  Recent Labs Lab 07/04/15 0954 07/05/15 1156  WBC 6.1 7.3  HGB 13.8 11.5*  HCT 38.9 33.8*  MCV 81.6 85.1  PLT 179 0000000   Basic Metabolic Panel:  Recent Labs Lab 07/04/15 0954 07/05/15 1156  NA 136 134*  K 3.6 3.2*  CL 93* 94*  CO2 22 18*  GLUCOSE 457* 377*  BUN 14 16  CREATININE 0.77 0.86  CALCIUM 10.2 9.8   GFR: Estimated Creatinine Clearance: 55.6 mL/min (by C-G formula based on Cr of 0.86). Liver Function Tests:  Recent Labs Lab 07/05/15 1156  AST 192*  ALT 141*  ALKPHOS 149*  BILITOT 1.1  PROT 8.1   ALBUMIN 5.0    Recent Labs Lab 07/05/15 1156  LIPASE 15   No results for input(s): AMMONIA in the last 168 hours. Coagulation Profile: No results for input(s): INR, PROTIME in the last 168 hours. Cardiac Enzymes: No results for input(s):  CKTOTAL, CKMB, CKMBINDEX, TROPONINI in the last 168 hours. BNP (last 3 results) No results for input(s): PROBNP in the last 8760 hours. HbA1C: No results for input(s): HGBA1C in the last 72 hours. CBG:  Recent Labs Lab 07/04/15 0942 07/04/15 1346 07/05/15 1110  GLUCAP 408* 209* 278*   Lipid Profile: No results for input(s): CHOL, HDL, LDLCALC, TRIG, CHOLHDL, LDLDIRECT in the last 72 hours. Thyroid Function Tests: No results for input(s): TSH, T4TOTAL, FREET4, T3FREE, THYROIDAB in the last 72 hours. Anemia Panel: No results for input(s): VITAMINB12, FOLATE, FERRITIN, TIBC, IRON, RETICCTPCT in the last 72 hours. Urine analysis:    Component Value Date/Time   COLORURINE YELLOW 07/04/2015 1118   APPEARANCEUR CLOUDY* 07/04/2015 1118   LABSPEC 1.030 07/04/2015 1118   PHURINE 8.5* 07/04/2015 1118   GLUCOSEU >1000* 07/04/2015 1118   HGBUR NEGATIVE 07/04/2015 1118   BILIRUBINUR NEGATIVE 07/04/2015 1118   BILIRUBINUR neg 05/26/2015 1441   KETONESUR NEGATIVE 07/04/2015 1118   PROTEINUR NEGATIVE 07/04/2015 1118   PROTEINUR 30mg  05/26/2015 1441   UROBILINOGEN 0.2 05/26/2015 1441   UROBILINOGEN 0.2 01/04/2015 1436   NITRITE NEGATIVE 07/04/2015 1118   NITRITE neg 05/26/2015 1441   LEUKOCYTESUR MODERATE* 07/04/2015 1118   Sepsis Labs: !!!!!!!!!!!!!!!!!!!!!!!!!!!!!!!!!!!!!!!!!!!! @LABRCNTIP (procalcitonin:4,lacticidven:4) ) Recent Results (from the past 240 hour(s))  C difficile quick scan w PCR reflex     Status: None   Collection Time: 07/04/15 11:18 AM  Result Value Ref Range Status   C Diff antigen NEGATIVE NEGATIVE Final   C Diff toxin NEGATIVE NEGATIVE Final   C Diff interpretation Negative for toxigenic C. difficile  Final      Radiological Exams on Admission: No results found.  EKG: Independently reviewed.   Assessment/Plan Active Problems:   Diabetic gastroparesis associated with type 1 diabetes mellitus ([place])   Uncontrolled diabetes mellitus with diabetic neuropathy, with long-term current use of insulin ([facility])   DKA (diabetic ketoacidoses) ([place])  1.  Suspected early diabetic ketoacidosis. Ms. Prisock is a 35 year old with history of insulin-dependent type 1 diabetes mellitus,  Currently on an insulin pump, presenting with multiple episodes of nausea and vomiting along with abdominal pain. Lab work showing an anion gap of 22 with a bicarbonate of 18. Urine however did not reveal presence of ketones. She reports current symptoms similar to previous episodes of diabetic you acidosis. Will place her on the DKA protocol, provide IV fluids, IV insulin, correction of electrolytes as needed. Will be admitted to the step down unit for close monitoring.  2.  Nausea/vomiting. She reports with multiple episodes of nausea and vomiting, unable to hold by mouth down. I suspect related to diabetic ketoacidosis.  There could also be diabetic gastroparesis component  as well. Will provide supportive care,  schedule Reglan 5 mg IV every 8 hours along with as needed Zofran.   3.  Elevated transaminases. Lab work showing an elevated AST of 192 and ALT of 141. On 06/23/2015 she had an AST of 45 and ALT of 50. Bilirubin within normal limits at 1.1. White count also within normal limits at 7300. She also reports abdominal pain. Will further workup with right upper quadrant ultrasound..   DVT prophylaxis: Lovenox Code Status: full code Family Communication: I spoke with her mother was present at bedside Disposition Plan: *anticipate she will require at least 2 night hospitalization Consults called:  Admission status: plan to admit to the step down unit   Kelvin Cellar MD [facility] Pager [phone]  If 7PM-7AM,  please contact night-coverage  [email or web address] Password [email or web address]  07/05/2015, 1:50 PM

## 2015-07-05 NOTE — ED Notes (Signed)
ED PA at bedside

## 2015-07-05 NOTE — Progress Notes (Signed)
Pt with no IV access. MD made aware and order given for stat piccline placement. IV team rn made aware. MD wants pt to resume insulin pump from home since insulin drip and glucose stabilizer has been turned off with loss of her IV site and to contact pharmacy regarding management of pump. Pharmacist made aware. See orders placed by pharmacist. Pt and mother also made aware of plan of care.  Pt's insulin pump not available. Mother left for home to get pt's insulin pump. IV to attempt restarting an IV at the beginning of the next shift. VWilliams, rn.

## 2015-07-05 NOTE — ED Notes (Signed)
Pt requested to be placed on oxygen  [place]. Pt believes it will help her anxiety.

## 2015-07-05 NOTE — ED Notes (Signed)
Potassium pending

## 2015-07-05 NOTE — ED Notes (Signed)
Charge Art therapist called for 20 minute timer

## 2015-07-05 NOTE — ED Notes (Addendum)
Pt c/o increased Blood sugar "severe" abd  and vomiting, seen here yesterday for same.

## 2015-07-05 NOTE — Progress Notes (Signed)
Pt with occlusion in her IV. attempted to flush but unsuccessful. Multiple attempts have been made today to start an IV on pt with all but  1 successful stick. MD made aware. Order for picc line placed. VWilliams,rn.

## 2015-07-05 NOTE — ED Notes (Addendum)
PT CONTINUES TO HAVE GLUCOSE MONITOR [place] DEVICE ATTACHED TO LEFT UPPER ARM. PT TOOK HOME INSULIN MONITOR DEVICE HOME BY FAMILY.

## 2015-07-06 DIAGNOSIS — K3184 Gastroparesis: Secondary | ICD-10-CM

## 2015-07-06 DIAGNOSIS — E1043 Type 1 diabetes mellitus with diabetic autonomic (poly)neuropathy: Secondary | ICD-10-CM

## 2015-07-06 LAB — BASIC METABOLIC PANEL
Anion gap: 11 (ref 5–15)
Anion gap: 11 (ref 5–15)
Anion gap: 9 (ref 5–15)
BUN: 14 mg/dL (ref 6–20)
BUN: 16 mg/dL (ref 6–20)
BUN: 17 mg/dL (ref 6–20)
CALCIUM: 8.1 mg/dL — AB (ref 8.9–10.3)
CALCIUM: 8.2 mg/dL — AB (ref 8.9–10.3)
CALCIUM: 8.4 mg/dL — AB (ref 8.9–10.3)
CHLORIDE: 107 mmol/L (ref 101–111)
CHLORIDE: 106 mmol/L (ref 101–111)
CO2: 18 mmol/L — ABNORMAL LOW (ref 22–32)
CO2: 19 mmol/L — ABNORMAL LOW (ref 22–32)
CO2: 21 mmol/L — ABNORMAL LOW (ref 22–32)
CREATININE: 0.7 mg/dL (ref 0.44–1.00)
CREATININE: 0.77 mg/dL (ref 0.44–1.00)
CREATININE: 0.63 mg/dL (ref 0.44–1.00)
Chloride: 109 mmol/L (ref 101–111)
GFR calc Af Amer: >60 mL/min (ref >60)
Glucose, Bld: 103 mg/dL — ABNORMAL HIGH (ref 65–99)
Glucose, Bld: 232 mg/dL — ABNORMAL HIGH (ref 65–99)
Glucose, Bld: 239 mg/dL — ABNORMAL HIGH (ref 65–99)
Potassium: 2.7 mmol/L — CL (ref 3.5–5.1)
Potassium: 3.7 mmol/L (ref 3.5–5.1)
Potassium: 4.6 mmol/L (ref 3.5–5.1)
SODIUM: 137 mmol/L (ref 135–145)
SODIUM: 139 mmol/L (ref 135–145)
SODIUM: 135 mmol/L (ref 135–145)

## 2015-07-06 LAB — GLUCOSE, CAPILLARY
GLUCOSE-CAPILLARY: 97 mg/dL (ref 65–99)
GLUCOSE-CAPILLARY: 100 mg/dL — AB (ref 65–99)
GLUCOSE-CAPILLARY: 229 mg/dL — AB (ref 65–99)
GLUCOSE-CAPILLARY: 296 mg/dL — AB (ref 65–99)
Glucose-Capillary: 249 mg/dL — ABNORMAL HIGH (ref 65–99)
Glucose-Capillary: 207 mg/dL — ABNORMAL HIGH (ref 65–99)
Glucose-Capillary: 150 mg/dL — ABNORMAL HIGH (ref 65–99)
Glucose-Capillary: 126 mg/dL — ABNORMAL HIGH (ref 65–99)
Glucose-Capillary: 338 mg/dL — ABNORMAL HIGH (ref 65–99)
Glucose-Capillary: 370 mg/dL — ABNORMAL HIGH (ref 65–99)

## 2015-07-06 MED ORDER — INSULIN ASPART 100 UNIT/ML ~~LOC~~ SOLN
0.0000 [IU] | SUBCUTANEOUS | Status: DC
  Administered 2015-07-06 (×2): 4 [IU] via SUBCUTANEOUS
  Administered 2015-07-07: 6 [IU] via SUBCUTANEOUS
  Administered 2015-07-07: 5 [IU] via SUBCUTANEOUS
  Administered 2015-07-07: 7 [IU] via SUBCUTANEOUS
  Administered 2015-07-07 – 2015-07-08 (×2): 1 [IU] via SUBCUTANEOUS
  Administered 2015-07-08 (×2): 3 [IU] via SUBCUTANEOUS

## 2015-07-06 MED ORDER — POTASSIUM CHLORIDE CRYS ER 20 MEQ PO TBCR
40.0000 meq | EXTENDED_RELEASE_TABLET | Freq: Once | ORAL | Status: AC
  Administered 2015-07-06: 40 meq via ORAL
  Filled 2015-07-06: qty 2

## 2015-07-06 MED ORDER — POTASSIUM CHLORIDE 10 MEQ/100ML IV SOLN: 10.0000 meq | INTRAVENOUS | Status: DC

## 2015-07-06 MED ORDER — POTASSIUM CHLORIDE 20 MEQ/15ML (10%) PO SOLN
40.0000 meq | Freq: Once | ORAL | Status: AC
  Administered 2015-07-06: 40 meq via ORAL
  Filled 2015-07-06: qty 30

## 2015-07-06 MED ORDER — INSULIN GLARGINE 100 UNIT/ML ~~LOC~~ SOLN
10.0000 [IU] | Freq: Every day | SUBCUTANEOUS | Status: DC
  Administered 2015-07-07: 10 [IU] via SUBCUTANEOUS
  Filled 2015-07-06 (×2): qty 0.1

## 2015-07-06 MED ORDER — SODIUM CHLORIDE 0.9% FLUSH
10.0000 mL | Freq: Two times a day (BID) | INTRAVENOUS | Status: DC
  Administered 2015-07-06 – 2015-07-07 (×4): 10 mL

## 2015-07-06 MED ORDER — INSULIN GLARGINE 100 UNIT/ML ~~LOC~~ SOLN
10.0000 [IU] | Freq: Once | SUBCUTANEOUS | Status: AC
  Administered 2015-07-06: 10 [IU] via SUBCUTANEOUS
  Filled 2015-07-06: qty 0.1

## 2015-07-06 MED ORDER — SODIUM CHLORIDE 0.9% FLUSH: 10.0000 mL | INTRAVENOUS | Status: DC | PRN

## 2015-07-06 MED ORDER — INSULIN ASPART 100 UNIT/ML ~~LOC~~ SOLN
0.0000 [IU] | Freq: Three times a day (TID) | SUBCUTANEOUS | Status: DC
  Administered 2015-07-06: 3 [IU] via SUBCUTANEOUS
  Administered 2015-07-06: 5 [IU] via SUBCUTANEOUS

## 2015-07-06 MED ORDER — INSULIN GLARGINE 100 UNIT/ML ~~LOC~~ SOLN
7.0000 [IU] | Freq: Every day | SUBCUTANEOUS | Status: DC
  Administered 2015-07-06 – 2015-07-07 (×2): 7 [IU] via SUBCUTANEOUS
  Filled 2015-07-06 (×2): qty 0.07

## 2015-07-06 NOTE — Progress Notes (Addendum)
Patient blood sugar 338 and sliding scale insulin correction coverage is 11 units. Patient states that 11 units of insulin will drop her blood sugar too low. Patient states that she should only get 4 units based on sensitivity to insulin. Dr. Earlie Counts on unit and spoke with patient at bedside about insulin coverage. Dr. Earlie Counts is okay with patient managing sliding scale insulin coverage.

## 2015-07-06 NOTE — Progress Notes (Signed)
Peripherally Inserted Central Catheter/Midline Placement  The IV Nurse has discussed with the patient and/or persons authorized to consent for the patient, the purpose of this procedure and the potential benefits and risks involved with this procedure.  The benefits include less needle sticks, lab draws from the catheter and patient may be discharged home with the catheter.  Risks include, but not limited to, infection, bleeding, blood clot (thrombus formation), and puncture of an artery; nerve damage and irregular heat beat.  Alternatives to this procedure were also discussed.  PICC/Midline Placement Documentation  PICC Double Lumen [id number] PICC Right Basilic 36 cm 0 cm (Active)  Indication for Insertion or Continuance of Line Poor Vasculature-patient has had multiple peripheral attempts or PIVs lasting less than 24 hours 07/06/2015  8:27 AM  Exposed Catheter (cm) 0 cm 07/06/2015  8:27 AM  Site Assessment Clean;Dry;Intact 07/06/2015  8:27 AM  Lumen #1 Status Flushed;Saline locked;Blood return noted 07/06/2015  8:27 AM  Lumen #2 Status Flushed;Saline locked;Blood return noted 07/06/2015  8:27 AM  Dressing Type Transparent 07/06/2015  8:27 AM  Dressing Status Clean;Dry;Intact 07/06/2015  8:27 AM  Dressing Intervention New dressing 07/06/2015  8:27 AM  Dressing Change Due 07/13/15 07/06/2015  8:27 AM       [place] Payment 07/06/2015, 8:28 AM

## 2015-07-06 NOTE — Progress Notes (Signed)
PROGRESS NOTE    [name]  [id number] DOB: Nov 20, 1980 DOA: 07/05/2015 PCP: Arnoldo Morale, MD  Outpatient Specialists:     Brief Narrative:  35 y.o. female with medical history significant of type 1 diabetes mellitus having prior hospitalizations for diabetic ketoacidosis, was last admitted on 05/03/2015 through 05/05/2015 for hyperosmolar nonketotic state, presents to the emergency department with complaints of nausea and vomiting. She states having multiple episodes of nausea and vomiting over the past 3 days, inability to hold by mouth down. She also reports generalized abdominal pain as well as diarrhea along with generalized weakness, fatigue, and overall feeling ill. She states that current symptoms similar to prior episodes of diabetic ketoacidosis. She has an endocrinologist Dr Paticia Stack who she follows. She is also currently on an insulin pump. She denies fevers, chills, dysuria, hematuria, syncope, presyncope.   Assessment & Plan:   Active Problems:   Diabetic gastroparesis associated with type 1 diabetes mellitus ([place])   Uncontrolled diabetes mellitus with diabetic neuropathy, with long-term current use of insulin (H[facility])   DKA (diabetic ketoacidoses) ([place])   1. Suspected early diabetic ketoacidosis. History of insulin-dependent type 1 diabetes mellitus,presented on an insulin pump with multiple episodes of nausea and vomiting along with abdominal pain. Lab work showing an anion gap of 22 with a bicarbonate of 18.  - glucose improved while on insulin gtt - outpatient records reviewed. Pt is followed at [facility] for DM. On 5/12, Endocrine recs to continue OFF insulin pump and to continue subQ insulin alone. Patient has appointment with [facility] Endocrinologist on 5/16  2. Nausea/vomiting. She reports with multiple episodes of nausea and vomiting, unable to hold by mouth down, likely secondary to diabetic ketoacidosis.On scheduled Reglan 5 mg IV every 8 hours along  with as needed Zofran.   3. Elevated transaminases. Lab work showing an elevated AST of 192 and ALT of 141. On 06/23/2015 she had an AST of 45 and ALT of 50. Bilirubin within normal limits at 1.1. RUQ US unremarkable. Suspect related to above DKA  DVT prophylaxis: Lovenox subQ Code Status: Full Family Communication: Pt in room, family not at bedside Disposition Plan: (specify when and where you expect patient to be discharged)   Consultants:   Diabetic Coordinator  Procedures:     Antimicrobials:      Subjective: Overall feeling better but still complaining of generalized abd pain this AM  Objective: Filed Vitals:   07/06/15 0900 07/06/15 1000 07/06/15 1100 07/06/15 1200  BP:    100/65  Pulse: 102 106 97 99  Temp:      TempSrc:      Resp:  14 22 14   Height:      Weight:      SpO2: 99% 100% 99% 100%    Intake/Output Summary (Last 24 hours) at 07/06/15 1412 Last data filed at 07/06/15 0300  Gross per 24 hour  Intake 748.86 ml  Output      0 ml  Net 748.86 ml   Filed Weights   07/05/15 1111  Weight: 38.556 kg (85 lb)    Examination:  General exam: Appears calm and comfortable  Respiratory system: Clear to auscultation. Respiratory effort normal. Cardiovascular system: S1 & S2 heard, RRR. No pedal edema. Gastrointestinal system: Abdomen is nondistended, generally tender. No organomegaly or masses felt. Normal bowel sounds heard. Central nervous system: Alert and oriented. No focal neurological deficits. Extremities: Symmetric 5 x 5 power. Skin: No rashes, lesions or ulcers Psychiatry: Judgement and insight appear normal.  Mood & affect appropriate.     Data Reviewed: I have personally reviewed following labs and imaging studies  CBC:  Recent Labs Lab 07/04/15 0954 07/05/15 1156  WBC 6.1 7.3  HGB 13.8 11.5*  HCT 38.9 33.8*  MCV 81.6 85.1  PLT 179 0000000   Basic Metabolic Panel:  Recent Labs Lab 07/05/15 1605 07/05/15 2004 07/06/15 0004  07/06/15 0319 07/06/15 0733  NA 139 137 137 139 135  K 3.0* 3.4* 3.7 2.7* 4.6  CL 107 105 107 109 106  CO2 19* 16* 19* 21* 18*  GLUCOSE 231* 280* 239* 103* 232*  BUN 16 17 17 16 14   CREATININE 0.74 0.68 0.77 0.70 0.63  CALCIUM 7.7* 8.0* 8.1* 8.2* 8.4*   GFR: Estimated Creatinine Clearance: 59.8 mL/min (by C-G formula based on Cr of 0.63). Liver Function Tests:  Recent Labs Lab 07/05/15 1156  AST 192*  ALT 141*  ALKPHOS 149*  BILITOT 1.1  PROT 8.1  ALBUMIN 5.0    Recent Labs Lab 07/05/15 1156  LIPASE 15   No results for input(s): AMMONIA in the last 168 hours. Coagulation Profile: No results for input(s): INR, PROTIME in the last 168 hours. Cardiac Enzymes: No results for input(s): CKTOTAL, CKMB, CKMBINDEX, TROPONINI in the last 168 hours. BNP (last 3 results) No results for input(s): PROBNP in the last 8760 hours. HbA1C: No results for input(s): HGBA1C in the last 72 hours. CBG:  Recent Labs Lab 07/06/15 0321 07/06/15 0425 07/06/15 0532 07/06/15 0750 07/06/15 1223  GLUCAP 97 100* 126* 229* 296*   Lipid Profile: No results for input(s): CHOL, HDL, LDLCALC, TRIG, CHOLHDL, LDLDIRECT in the last 72 hours. Thyroid Function Tests: No results for input(s): TSH, T4TOTAL, FREET4, T3FREE, THYROIDAB in the last 72 hours. Anemia Panel: No results for input(s): VITAMINB12, FOLATE, FERRITIN, TIBC, IRON, RETICCTPCT in the last 72 hours. Urine analysis:    Component Value Date/Time   COLORURINE YELLOW 07/04/2015 1118   APPEARANCEUR CLOUDY* 07/04/2015 1118   LABSPEC 1.030 07/04/2015 1118   PHURINE 8.5* 07/04/2015 1118   GLUCOSEU >1000* 07/04/2015 1118   HGBUR NEGATIVE 07/04/2015 1118   [place] 07/04/2015 1118   BILIRUBINUR neg 05/26/2015 1441   KETONESUR NEGATIVE 07/04/2015 1118   PROTEINUR NEGATIVE 07/04/2015 1118   PROTEINUR 30mg  05/26/2015 1441   UROBILINOGEN 0.2 05/26/2015 1441   UROBILINOGEN 0.2 01/04/2015 1436   NITRITE NEGATIVE 07/04/2015  1118   NITRITE neg 05/26/2015 1441   LEUKOCYTESUR MODERATE* 07/04/2015 1118   Sepsis Labs: No results for input(s): PROCALCITON, LATICACIDVEN in the last 168 hours.  Recent Results (from the past 240 hour(s))  C difficile quick scan w PCR reflex     Status: None   Collection Time: 07/04/15 11:18 AM  Result Value Ref Range Status   C Diff antigen NEGATIVE NEGATIVE Final   C Diff toxin NEGATIVE NEGATIVE Final   C Diff interpretation Negative for toxigenic C. difficile  Final  Urine culture     Status: Abnormal (Preliminary result)   Collection Time: 07/04/15 12:27 PM  Result Value Ref Range Status   Specimen Description URINE, CLEAN CATCH  Final   Special Requests NONE  Final   Culture >=100,000 COLONIES/mL GRAM NEGATIVE RODS (A)  Final   Report Status PENDING  Incomplete  MRSA PCR Screening     Status: None   Collection Time: 07/05/15  3:13 PM  Result Value Ref Range Status   MRSA by PCR NEGATIVE NEGATIVE Final    Comment:  The GeneXpert MRSA Assay (FDA approved for NASAL specimens only), is one component of a comprehensive MRSA colonization surveillance program. It is not intended to diagnose MRSA infection nor to guide or monitor treatment for MRSA infections.          Radiology Studies: US Abdomen Complete  07/05/2015  CLINICAL DATA:  Abdominal pain for 3 days EXAM: ABDOMEN ULTRASOUND COMPLETE COMPARISON:  CT scan 03/11/2015 FINDINGS: Gallbladder: No gallstones or wall thickening visualized. No sonographic Murphy sign noted by sonographer. Common bile duct: Diameter: 3 mm in diameter Liver: No focal lesion identified. Within normal limits in parenchymal echogenicity. IVC: No abnormality visualized. Pancreas: Visualized portion unremarkable. Spleen: Size and appearance within normal limits. The spleen measures 7.2 cm in length Right Kidney: Length: 10.5 cm. Echogenicity within normal limits. No mass or hydronephrosis visualized. Left Kidney: Length: 10.3 cm  Echogenicity within normal limits. No mass or hydronephrosis visualized. Abdominal aorta: No aneurysm visualized. Measures up to 1.7 cm in diameter. Other findings: None. IMPRESSION: 1. No gallstones are noted within gallbladder.  Normal CBD. 2. No hydronephrosis. 3. No aortic aneurysm. Electronically Signed   By: Lahoma Crocker M.D.   On: 07/05/2015 18:44        Scheduled Meds: . amitriptyline  100 mg Oral QHS  . cholestyramine  4 g Oral BID WC  . enoxaparin (LOVENOX) injection  40 mg Subcutaneous Q24H  . insulin aspart  0-9 Units Subcutaneous TID WC  . [START ON 07/07/2015] insulin glargine  10 Units Subcutaneous Daily  . insulin glargine  7 Units Subcutaneous QHS  . insulin pump   Subcutaneous Q4H  . metoCLOPramide (REGLAN) injection  5 mg Intravenous Q8H  . sodium chloride flush  10-40 mL Intracatheter Q12H    LOS: 1 day      Pete Schnitzer, Orpah Melter, MD [facility] Pager [phone]  If 7PM-7AM, please contact night-coverage [email or web address] Password [email or web address] 07/06/2015, 2:12 PM

## 2015-07-07 DIAGNOSIS — E0810 Diabetes mellitus due to underlying condition with ketoacidosis without coma: Secondary | ICD-10-CM

## 2015-07-07 LAB — COMPREHENSIVE METABOLIC PANEL
ALK PHOS: 81 U/L (ref 38–126)
ALT: 52 U/L (ref 14–54)
ANION GAP: 4 — AB (ref 5–15)
AST: 30 U/L (ref 15–41)
Albumin: 3.1 g/dL — ABNORMAL LOW (ref 3.5–5.0)
BILIRUBIN TOTAL: 0.3 mg/dL (ref 0.3–1.2)
BUN: 12 mg/dL (ref 6–20)
CALCIUM: 8 mg/dL — AB (ref 8.9–10.3)
CO2: 24 mmol/L (ref 22–32)
Chloride: 103 mmol/L (ref 101–111)
Creatinine, Ser: 0.51 mg/dL (ref 0.44–1.00)
GFR calc non Af Amer: >60 mL/min (ref >60)
GLUCOSE: 162 mg/dL — AB (ref 65–99)
POTASSIUM: 4 mmol/L (ref 3.5–5.1)
Sodium: 131 mmol/L — ABNORMAL LOW (ref 135–145)
TOTAL PROTEIN: 5.4 g/dL — AB (ref 6.5–8.1)

## 2015-07-07 LAB — GLUCOSE, CAPILLARY
GLUCOSE-CAPILLARY: 189 mg/dL — AB (ref 65–99)
GLUCOSE-CAPILLARY: 254 mg/dL — AB (ref 65–99)
GLUCOSE-CAPILLARY: 375 mg/dL — AB (ref 65–99)
GLUCOSE-CAPILLARY: 93 mg/dL (ref 65–99)
Glucose-Capillary: 346 mg/dL — ABNORMAL HIGH (ref 65–99)
Glucose-Capillary: 310 mg/dL — ABNORMAL HIGH (ref 65–99)
Glucose-Capillary: 85 mg/dL (ref 65–99)

## 2015-07-07 LAB — URINE CULTURE: Culture: >=100000 — AB

## 2015-07-07 MED ORDER — OXYCODONE-ACETAMINOPHEN 5-325 MG PO TABS
1.0000 | ORAL_TABLET | Freq: Three times a day (TID) | ORAL | Status: DC | PRN
  Administered 2015-07-07 – 2015-07-08 (×2): 1 via ORAL
  Filled 2015-07-07 (×2): qty 1

## 2015-07-07 MED ORDER — DEXTROSE 5 % IV SOLN
1.0000 g | INTRAVENOUS | Status: DC
  Administered 2015-07-07: 1 g via INTRAVENOUS
  Filled 2015-07-07 (×2): qty 10

## 2015-07-07 MED ORDER — INSULIN ASPART 100 UNIT/ML ~~LOC~~ SOLN
2.0000 [IU] | Freq: Three times a day (TID) | SUBCUTANEOUS | Status: DC
  Administered 2015-07-07 – 2015-07-08 (×2): 2 [IU] via SUBCUTANEOUS

## 2015-07-07 NOTE — Progress Notes (Signed)
PROGRESS NOTE    [name]  [id number] DOB: [birth year] DOA: 07/05/2015 PCP: Arnoldo Morale, MD  Outpatient Specialists:     Brief Narrative:  35 y.o. female with medical history significant of type 1 diabetes mellitus having prior hospitalizations for diabetic ketoacidosis, was last admitted on 05/03/2015 through 05/05/2015 for hyperosmolar nonketotic state, presents to the emergency department with complaints of nausea and vomiting. She states having multiple episodes of nausea and vomiting over the past 3 days, inability to hold by mouth down. She also reports generalized abdominal pain as well as diarrhea along with generalized weakness, fatigue, and overall feeling ill. She states that current symptoms similar to prior episodes of diabetic ketoacidosis. She has an endocrinologist Dr Paticia Stack who she follows. She is also currently on an insulin pump. She denies fevers, chills, dysuria, hematuria, syncope, presyncope.   Assessment & Plan:   Active Problems:   Diabetic gastroparesis associated with type 1 diabetes mellitus ([place])   Uncontrolled diabetes mellitus with diabetic neuropathy, with long-term current use of insulin (HCC)   DKA (diabetic ketoacidoses) ([place])   1. Suspected early diabetic ketoacidosis. History of insulin-dependent type 1 diabetes mellitus,presented on an insulin pump with multiple episodes of nausea and vomiting along with abdominal pain. Lab work showing an anion gap of 22 with a bicarbonate of 18.  - glucose improved while on insulin gtt - outpatient records reviewed. Pt is followed at [facility] for DM. On 5/12, Endocrine recs to continue OFF insulin pump and to continue subQ insulin alone. Patient has appointment with [facility] Endocrinologist on 5/16 - Glucose remains labile, responsive to supplemental insulin - Per diabetic coordinator, recommendation for 2 units of pre-meal short-acting insulin  2. Nausea/vomiting. She reports with multiple  episodes of nausea and vomiting, unable to hold by mouth down, likely secondary to diabetic ketoacidosis. - Had been continued on every 8 hour Reglan, however patient has reported multiple bowel movements overnight and this morning. As such, have held further Reglan  3. Elevated transaminases. Lab work showing an elevated AST of 192 and ALT of 141. On 06/23/2015 she had an AST of 45 and ALT of 50. Bilirubin within normal limits at 1.1. RUQ US unremarkable. Suspect related to above DKA  4. Klebsiella UTI - Patient was seen in the ER 1 day prior to hospital admission for UTI. Urine culture did confirm Klebsiella species that was sensitive to ampicillin/sulbactam, cefazolin, Rocephin, ciprofloxacin, gentamicin, imipenem, Zosyn, Bactrim. Resistant to ampicillin.  5. Abdominal pain - A she reports on acute history of abdominal pain for the past year and a half. - Will resume patient's home regimen of Percocet every 8 hours when necessary pain - Presenting of abdominal ultrasound was unremarkable  DVT prophylaxis: Lovenox subQ Code Status: Full Family Communication: Pt in room, family not at bedside Disposition Plan: Possible discharge on 07/08/2015   Consultants:   Diabetic Coordinator  Procedures:     Antimicrobials:      Subjective: In good spirits this morning, still complaining of generalized abdominal pain  Objective: Filed Vitals:   07/06/15 2046 07/06/15 2259 07/07/15 0453 07/07/15 1349  BP:  108/74 96/74 121/85  Pulse:  100 99 109  Temp: 98.4 F (36.9 C) 98.8 F (37.1 C) 98.5 F (36.9 C) 99.1 F (37.3 C)  TempSrc: Oral Oral Oral Oral  Resp:  16 16 16   Height:      Weight:      SpO2:  100% 100% 100%    Intake/Output Summary (Last 24 hours)  at 07/07/15 1714 Last data filed at 07/07/15 1349  Gross per 24 hour  Intake    960 ml  Output      0 ml  Net    960 ml   Filed Weights   07/05/15 1111  Weight: 38.556 kg (85 lb)    Examination:  General exam:  Appears calm and comfortable, Lying in bed Respiratory system: Clear to auscultation. Respiratory effort normal. Cardiovascular system: S1 & S2 heard, RRR. No pedal edema. Gastrointestinal system: Abdomen is nondistended, generally tender. No organomegaly or masses felt. Normal bowel sounds heard. Central nervous system: Alert and oriented. No focal neurological deficits. Extremities: Symmetric 5 x 5 power. Skin: No rashes, lesions  Psychiatry: Judgement and insight appear normal. Mood & affect appropriate.     Data Reviewed: I have personally reviewed following labs and imaging studies  CBC:  Recent Labs Lab 07/04/15 0954 07/05/15 1156  WBC 6.1 7.3  HGB 13.8 11.5*  HCT 38.9 33.8*  MCV 81.6 85.1  PLT 179 0000000   Basic Metabolic Panel:  Recent Labs Lab 07/05/15 2004 07/06/15 0004 07/06/15 0319 07/06/15 0733 07/07/15 0500  NA 137 137 139 135 131*  K 3.4* 3.7 2.7* 4.6 4.0  CL 105 107 109 106 103  CO2 16* 19* 21* 18* 24  GLUCOSE 280* 239* 103* 232* 162*  BUN 17 17 16 14 12   CREATININE 0.68 0.77 0.70 0.63 0.51  CALCIUM 8.0* 8.1* 8.2* 8.4* 8.0*   GFR: Estimated Creatinine Clearance: 59.8 mL/min (by C-G formula based on Cr of 0.51). Liver Function Tests:  Recent Labs Lab 07/05/15 1156 07/07/15 0500  AST 192* 30  ALT 141* 52  ALKPHOS 149* 81  BILITOT 1.1 0.3  PROT 8.1 5.4*  ALBUMIN 5.0 3.1*    Recent Labs Lab 07/05/15 1156  LIPASE 15   No results for input(s): AMMONIA in the last 168 hours. Coagulation Profile: No results for input(s): INR, PROTIME in the last 168 hours. Cardiac Enzymes: No results for input(s): CKTOTAL, CKMB, CKMBINDEX, TROPONINI in the last 168 hours. BNP (last 3 results) No results for input(s): PROBNP in the last 8760 hours. HbA1C: No results for input(s): HGBA1C in the last 72 hours. CBG:  Recent Labs Lab 07/07/15 0009 07/07/15 0445 07/07/15 0739 07/07/15 1140 07/07/15 1612  GLUCAP 375* 189* 93 346* 310*   Lipid  Profile: No results for input(s): CHOL, HDL, LDLCALC, TRIG, CHOLHDL, LDLDIRECT in the last 72 hours. Thyroid Function Tests: No results for input(s): TSH, T4TOTAL, FREET4, T3FREE, THYROIDAB in the last 72 hours. Anemia Panel: No results for input(s): VITAMINB12, FOLATE, FERRITIN, TIBC, IRON, RETICCTPCT in the last 72 hours. Urine analysis:    Component Value Date/Time   COLORURINE YELLOW 07/04/2015 1118   APPEARANCEUR CLOUDY* 07/04/2015 1118   LABSPEC 1.030 07/04/2015 1118   PHURINE 8.5* 07/04/2015 1118   GLUCOSEU >1000* 07/04/2015 1118   HGBUR NEGATIVE 07/04/2015 1118   BILIRUBINUR NEGATIVE 07/04/2015 1118   BILIRUBINUR neg 05/26/2015 1441   KETONESUR NEGATIVE 07/04/2015 1118   PROTEINUR NEGATIVE 07/04/2015 1118   PROTEINUR 30mg  05/26/2015 1441   UROBILINOGEN 0.2 05/26/2015 1441   UROBILINOGEN 0.2 01/04/2015 1436   NITRITE NEGATIVE 07/04/2015 1118   NITRITE neg 05/26/2015 1441   LEUKOCYTESUR MODERATE* 07/04/2015 1118   Sepsis Labs: No results for input(s): PROCALCITON, LATICACIDVEN in the last 168 hours.  Recent Results (from the past 240 hour(s))  C difficile quick scan w PCR reflex     Status: None   Collection Time: 07/04/15  11:18 AM  Result Value Ref Range Status   C Diff antigen NEGATIVE NEGATIVE Final   C Diff toxin NEGATIVE NEGATIVE Final   C Diff interpretation Negative for toxigenic C. difficile  Final  Urine culture     Status: Abnormal   Collection Time: 07/04/15 12:27 PM  Result Value Ref Range Status   Specimen Description URINE, CLEAN CATCH  Final   Special Requests NONE  Final   Culture >=100,000 COLONIES/mL KLEBSIELLA PNEUMONIAE (A)  Final   Report Status 07/07/2015 FINAL  Final   Organism ID, Bacteria KLEBSIELLA PNEUMONIAE (A)  Final      Susceptibility   Klebsiella pneumoniae - MIC*    AMPICILLIN >=32 RESISTANT Resistant     CEFAZOLIN <=4 SENSITIVE Sensitive     CEFTRIAXONE <=1 SENSITIVE Sensitive     CIPROFLOXACIN <=0.25 SENSITIVE Sensitive      GENTAMICIN <=1 SENSITIVE Sensitive     IMIPENEM <=0.25 SENSITIVE Sensitive     NITROFURANTOIN 64 INTERMEDIATE Intermediate     TRIMETH/SULFA <=20 SENSITIVE Sensitive     AMPICILLIN/SULBACTAM 4 SENSITIVE Sensitive     PIP/TAZO <=4 SENSITIVE Sensitive     * >=100,000 COLONIES/mL KLEBSIELLA PNEUMONIAE  MRSA PCR Screening     Status: None   Collection Time: 07/05/15  3:13 PM  Result Value Ref Range Status   MRSA by PCR NEGATIVE NEGATIVE Final    Comment:        The GeneXpert MRSA Assay (FDA approved for NASAL specimens only), is one component of a comprehensive MRSA colonization surveillance program. It is not intended to diagnose MRSA infection nor to guide or monitor treatment for MRSA infections.          Radiology Studies: US Abdomen Complete  07/05/2015  CLINICAL DATA:  Abdominal pain for 3 days EXAM: ABDOMEN ULTRASOUND COMPLETE COMPARISON:  CT scan 03/11/2015 FINDINGS: Gallbladder: No gallstones or wall thickening visualized. No sonographic Murphy sign noted by sonographer. Common bile duct: Diameter: 3 mm in diameter Liver: No focal lesion identified. Within normal limits in parenchymal echogenicity. IVC: No abnormality visualized. Pancreas: Visualized portion unremarkable. Spleen: Size and appearance within normal limits. The spleen measures 7.2 cm in length Right Kidney: Length: 10.5 cm. Echogenicity within normal limits. No mass or hydronephrosis visualized. Left Kidney: Length: 10.3 cm Echogenicity within normal limits. No mass or hydronephrosis visualized. Abdominal aorta: No aneurysm visualized. Measures up to 1.7 cm in diameter. Other findings: None. IMPRESSION: 1. No gallstones are noted within gallbladder.  Normal CBD. 2. No hydronephrosis. 3. No aortic aneurysm. Electronically Signed   By: Lahoma Crocker M.D.   On: 07/05/2015 18:44        Scheduled Meds: . amitriptyline  100 mg Oral QHS  . cefTRIAXone (ROCEPHIN)  IV  1 g Intravenous Q24H  . cholestyramine  4 g Oral  BID WC  . enoxaparin (LOVENOX) injection  40 mg Subcutaneous Q24H  . insulin aspart  0-15 Units Subcutaneous Q4H  . insulin aspart  2 Units Subcutaneous TID WC  . insulin glargine  10 Units Subcutaneous Daily  . insulin glargine  7 Units Subcutaneous QHS  . sodium chloride flush  10-40 mL Intracatheter Q12H    LOS: 2 days      CHIU, Orpah Melter, MD [facility] Pager [phone]  If 7PM-7AM, please contact night-coverage [email or web address] Password [email or web address] 07/07/2015, 5:14 PM

## 2015-07-07 NOTE — Progress Notes (Signed)
Inpatient Diabetes Program Recommendations  AACE/ADA: New Consensus Statement on Inpatient Glycemic Control (2015)  Target Ranges:  Prepandial:   less than 140 mg/dL      Peak postprandial:   less than 180 mg/dL (1-2 hours)      Critically ill patients:  140 - 180 mg/dL   Review of Glycemic Control Results for [name], [name] (MRN [id number]) as of 07/07/2015 09:44  Ref. Range 07/06/2015 07:50 07/06/2015 12:23 07/06/2015 16:18 07/06/2015 19:47 07/07/2015 00:09 07/07/2015 04:45 07/07/2015 07:39  Glucose-Capillary Latest Ref Range: 65-99 mg/dL 229 (H) 296 (H) 338 (H) 370 (H) 375 (H) 189 (H) 93  Good FBS. Post-prandial blood sugars elevated.  Inpatient Diabetes Program Recommendations:    Start low dose Novolog Meal Coverage- Novolog 2 units tid with meals Add HS correction.  Will continue to follow. Thank you. Lorenda Peck, RD, LDN, CDE Inpatient Diabetes Coordinator [phone]

## 2015-07-07 NOTE — Progress Notes (Signed)
PHARMACY NOTE -  Ceftriaxone  Pharmacy has been consulted to dose Ceftriaxone for UTI. Will order Ceftriaxone 1g IV q24h.  No renal adjustment needed; therefore, pharmacy will sign off since need for further dosage adjustment appears unlikely at present.    Please reconsult if a change in clinical status warrants re-evaluation of dosage.  Thank you for the consult.  Hershal Coria, PharmD, BCPS Pager: [phone] 07/02/2015 2:57 PM

## 2015-07-08 ENCOUNTER — Telehealth (HOSPITAL_BASED_OUTPATIENT_CLINIC_OR_DEPARTMENT_OTHER): Payer: Self-pay | Admitting: Emergency Medicine

## 2015-07-08 LAB — COMPREHENSIVE METABOLIC PANEL
ALT: 41 U/L (ref 14–54)
AST: 26 U/L (ref 15–41)
Albumin: 3.4 g/dL — ABNORMAL LOW (ref 3.5–5.0)
Alkaline Phosphatase: 75 U/L (ref 38–126)
Anion gap: 6 (ref 5–15)
BUN: 12 mg/dL (ref 6–20)
CHLORIDE: 104 mmol/L (ref 101–111)
CO2: 23 mmol/L (ref 22–32)
CREATININE: 0.43 mg/dL — AB (ref 0.44–1.00)
Calcium: 8.2 mg/dL — ABNORMAL LOW (ref 8.9–10.3)
GFR calc Af Amer: >60 mL/min (ref >60)
GFR calc non Af Amer: >60 mL/min (ref >60)
Glucose, Bld: 150 mg/dL — ABNORMAL HIGH (ref 65–99)
POTASSIUM: 4.1 mmol/L (ref 3.5–5.1)
SODIUM: 133 mmol/L — AB (ref 135–145)
Total Bilirubin: 0.2 mg/dL — ABNORMAL LOW (ref 0.3–1.2)
Total Protein: 5.8 g/dL — ABNORMAL LOW (ref 6.5–8.1)

## 2015-07-08 LAB — GLUCOSE, CAPILLARY
GLUCOSE-CAPILLARY: 151 mg/dL — AB (ref 65–99)
GLUCOSE-CAPILLARY: 287 mg/dL — AB (ref 65–99)
Glucose-Capillary: 157 mg/dL — ABNORMAL HIGH (ref 65–99)

## 2015-07-08 MED ORDER — CEFUROXIME AXETIL 250 MG PO TABS: 250.0000 mg | ORAL_TABLET | Freq: Two times a day (BID) | ORAL | Status: DC

## 2015-07-08 MED ORDER — INSULIN ASPART 100 UNIT/ML FLEXPEN: 2.0000 [IU] | PEN_INJECTOR | Freq: Three times a day (TID) | SUBCUTANEOUS | Status: DC

## 2015-07-08 MED ORDER — HYDROMORPHONE HCL 1 MG/ML IJ SOLN
1.0000 mg | Freq: Once | INTRAMUSCULAR | Status: DC
  Filled 2015-07-08: qty 1

## 2015-07-08 NOTE — Discharge Summary (Signed)
Physician Discharge Summary  DETTE BRIGNAC [id number] DOB: January 17, 1981 DOA: 07/05/2015  PCP: Arnoldo Morale, MD  Admit date: 07/05/2015 Discharge date: 07/08/2015  Time spent: 20 minutes  Recommendations for Outpatient Follow-up:  1. Follow up with Endocrine as scheduled 2. Follow up with PCP in 2-3 weeks   Discharge Diagnoses:  Principal Problem:   DKA (diabetic ketoacidoses) ([place]) Active Problems:   Diabetic gastroparesis associated with type 1 diabetes mellitus ([place])   Uncontrolled diabetes mellitus with diabetic neuropathy, with long-term current use of insulin [facility])   Discharge Condition: Improved  Diet recommendation: Diabetic  Filed Weights   07/05/15 1111  Weight: 38.556 kg (85 lb)    History of present illness:  Please review dictated H and P from 5/15 for details. Briefly, 35 y.o. female with medical history significant of type 1 diabetes mellitus having prior hospitalizations for diabetic ketoacidosis, was last admitted on 05/03/2015 through 05/05/2015 for hyperosmolar nonketotic state, presents to the emergency department with complaints of nausea and vomiting. She states having multiple episodes of nausea and vomiting over the past 3 days, inability to hold by mouth down. She also reports generalized abdominal pain as well as diarrhea along with generalized weakness, fatigue, and overall feeling ill. She states that current symptoms similar to prior episodes of diabetic ketoacidosis. She has an endocrinologist Dr Paticia Stack who she follows. She is also currently on an insulin pump. She denies fevers, chills, dysuria, hematuria, syncope, presyncope.  Hospital Course:  1. Suspected early diabetic ketoacidosis. History of insulin-dependent type 1 diabetes mellitus,presented on an insulin pump with multiple episodes of nausea and vomiting along with abdominal pain. Lab work showing an anion gap of 22 with a bicarbonate of 18.  - glucose improved while on insulin gtt -  outpatient records reviewed. Pt is followed at [facility]ocrinology for DM. On 5/12, Endocrine recs to continue OFF insulin pump and to continue subQ insulin alone. Patient has appointment with [facility] Endocrinologist on 5/16 - Glucose remains labile, responsive to supplemental insulin - Per diabetic coordinator, recommendation for 2 units of pre-meal short-acting insulin - Glucose remained stable overnight with peak glucose of 287 at midnight, 157 at 4am, and 151 at 740am  2. Nausea/vomiting. She reports with multiple episodes of nausea and vomiting, unable to hold by mouth down, likely secondary to diabetic ketoacidosis. - Had been continued on every 8 hour Reglan, however patient has reported multiple bowel movements overnight and this morning. As such, have held further Reglan  3. Elevated transaminases. Lab work showing an elevated AST of 192 and ALT of 141. On 06/23/2015 she had an AST of 45 and ALT of 50. Bilirubin within normal limits at 1.1. RUQ US unremarkable. Suspect related to above DKA  4. Klebsiella UTI - Patient was seen in the ER 1 day prior to hospital admission for UTI. Urine culture did confirm Klebsiella species that was sensitive to ampicillin/sulbactam, cefazolin, Rocephin, ciprofloxacin, gentamicin, imipenem, Zosyn, Bactrim. Resistant to ampicillin.  5. Abdominal pain - A she reports on history of abdominal pain for the past year and a half. - Resumed patient's home regimen of Percocet every 8 hours when necessary pain - Presenting of abdominal ultrasound was unremarkable   Discharge Exam: Filed Vitals:   07/07/15 0453 07/07/15 1349 07/07/15 2214 07/08/15 0530  BP: 96/74 121/85 102/71 107/69  Pulse: 99 109 108 101  Temp: 98.5 F (36.9 C) 99.1 F (37.3 C) 98.1 F (36.7 C) 97.8 F (36.6 C)  TempSrc: Oral Oral Oral Oral  Resp:  16 16 16 16   Height:      Weight:      SpO2: 100% 100% 100% 100%    General: Awake, in nad Cardiovascular: regular, s1, s2 Respiratory:  normal resp effort, no wheezing  Discharge Instructions     Medication List    STOP taking these medications        cephALEXin 500 MG capsule  Commonly known as:  KEFLEX     Insulin Infusion Pump Devi     metroNIDAZOLE 500 MG tablet  Commonly known as:  FLAGYL      TAKE these medications        acetaminophen 500 MG tablet  Commonly known as:  TYLENOL  Take 1,000 mg by mouth every 6 (six) hours as needed for moderate pain.     acetaminophen-codeine 300-30 MG tablet  Commonly known as:  TYLENOL #3  Take 1 tablet by mouth every 12 (twelve) hours as needed for moderate pain.     ALPRAZolam 1 MG tablet  Commonly known as:  XANAX  Take 1 mg by mouth 2 (two) times daily as needed for anxiety.     amitriptyline 50 MG tablet  Commonly known as:  ELAVIL  Take 2 tablets (100 mg total) by mouth at bedtime.     CALCIUM PO  Take 2 tablets by mouth daily.     cefUROXime 250 MG tablet  Commonly known as:  CEFTIN  Take 1 tablet (250 mg total) by mouth 2 (two) times daily with a meal.     cholestyramine 4 g packet  Commonly known as:  QUESTRAN  Take 1 packet (4 g total) by mouth 2 (two) times daily.     CREON 36000 UNITS Cpep capsule  Generic drug:  lipase/protease/amylase  Take 4-6 capsules by mouth. Takes 6 capsules with every meal and 4 capsules with every snack.     cyanocobalamin 500 MCG tablet  Take 2,000 mcg by mouth 2 (two) times daily as needed (mouth sores).     cyclobenzaprine 10 MG tablet  Commonly known as:  FLEXERIL  Take 10 mg by mouth 2 (two) times daily as needed. Dental problems     dicyclomine 10 MG capsule  Commonly known as:  BENTYL  Take 10 mg by mouth 4 (four) times daily -  before meals and at bedtime. Reported on 04/01/2015     DIGESTIVE ADVANTAGE GUMMIES PO  Take 2 tablets by mouth 2 (two) times daily.     diphenoxylate-atropine 2.5-0.025 MG tablet  Commonly known as:  LOMOTIL  Take 1 tablet by mouth 4 (four) times daily as needed for diarrhea  or loose stools.     feeding supplement (GLUCERNA SHAKE) Liqd  Take 237 mLs by mouth 3 (three) times daily between meals.     gabapentin 100 MG capsule  Commonly known as:  NEURONTIN  Take 300 mg by mouth at bedtime.     glucagon 1 MG injection  Commonly known as:  GLUCAGON EMERGENCY  Inject 1 mg into the muscle once as needed (severe hypoglycemia).     guaifenesin 100 MG/5ML syrup  Commonly known as:  ROBITUSSIN  Take 200 mg by mouth 3 (three) times daily as needed for cough.     HYDROcodone-acetaminophen 5-325 MG tablet  Commonly known as:  NORCO/VICODIN  Take 1 tablet by mouth every 4 (four) hours as needed (dental pain).     hydrOXYzine 25 MG tablet  Commonly known as:  ATARAX/VISTARIL  Take 1 tablet (25 mg  total) by mouth 3 (three) times daily as needed.     insulin aspart 100 UNIT/ML FlexPen  Commonly known as:  NOVOLOG  Inject 2 Units into the skin 3 (three) times daily with meals.     insulin glargine 100 UNIT/ML injection  Commonly known as:  LANTUS  Inject 0.07-0.08 mLs (7-8 Units total) into the skin 2 (two) times daily. 8 units in the morning and 7 units at night     insulin lispro 100 UNIT/ML injection  Commonly known as:  HUMALOG  Inject 16 Units into the skin daily. Plus bolus 1-5 units per meal, and correction factor 1 unit to every 100/200     multivitamin with minerals Tabs tablet  Take 2 tablets by mouth daily. Diabetic support vitamin     omeprazole 40 MG capsule  Commonly known as:  PRILOSEC  Take 40 mg by mouth daily as needed (indigestion).     prochlorperazine 10 MG tablet  Commonly known as:  COMPAZINE  Take 1 tablet (10 mg total) by mouth 2 (two) times daily as needed for nausea or vomiting.     promethazine 25 MG suppository  Commonly known as:  PHENERGAN  Place 1 suppository (25 mg total) rectally every 6 (six) hours as needed for nausea or vomiting.     promethazine 25 MG tablet  Commonly known as:  PHENERGAN  Take 1 tablet (25 mg  total) by mouth every 6 (six) hours as needed for nausea or vomiting.     saccharomyces boulardii 250 MG capsule  Commonly known as:  FLORASTOR  Take 1 capsule (250 mg total) by mouth 2 (two) times daily.     traZODone 50 MG tablet  Commonly known as:  DESYREL  Take 1 tablet (50 mg total) by mouth at bedtime as needed for sleep.     VIBERZI 100 MG Tabs  Generic drug:  Eluxadoline  Take 100 mg by mouth 2 (two) times daily. Reported on 07/05/2015       Allergies  Allergen Reactions  . Scallops [Shellfish Allergy] Anaphylaxis  . Diphenhydramine Hcl Nausea Only    IV diphenhydramine.  Oral capsules ok.    . Haloperidol And Related Itching    Pt wanted to rip skin off  . Ketorolac Itching  . Morphine And Related Itching  . Toradol [Ketorolac Tromethamine]     Pt wanted to rip skin off  . Latex Rash  . Other Rash    Sunscreen and grass   Follow-up Information    Follow up with Arnoldo Morale, MD. Schedule an appointment as soon as possible for a visit in 2 weeks.   Specialty:  Family Medicine   Why:  Hospital follow up   Contact information:   [address] [place] [address] [phone]       Follow up with Army Melia, MD.   Specialty:  Endocrinology   Why:  as scheduled today   Contact information:   [place] [address] [phone]        The results of significant diagnostics from this hospitalization (including imaging, microbiology, ancillary and laboratory) are listed below for reference.    Significant Diagnostic Studies: US Abdomen Complete  07/05/2015  CLINICAL DATA:  Abdominal pain for 3 days EXAM: ABDOMEN ULTRASOUND COMPLETE COMPARISON:  CT scan 03/11/2015 FINDINGS: Gallbladder: No gallstones or wall thickening visualized. No sonographic Murphy sign noted by sonographer. Common bile duct: Diameter: 3 mm in diameter Liver: No focal  lesion identified. Within normal limits in parenchymal echogenicity. IVC:  No abnormality visualized. Pancreas: Visualized portion unremarkable. Spleen: Size and appearance within normal limits. The spleen measures 7.2 cm in length Right Kidney: Length: 10.5 cm. Echogenicity within normal limits. No mass or hydronephrosis visualized. Left Kidney: Length: 10.3 cm Echogenicity within normal limits. No mass or hydronephrosis visualized. Abdominal aorta: No aneurysm visualized. Measures up to 1.7 cm in diameter. Other findings: None. IMPRESSION: 1. No gallstones are noted within gallbladder.  Normal CBD. 2. No hydronephrosis. 3. No aortic aneurysm. Electronically Signed   By: Lahoma Crocker M.D.   On: 07/05/2015 18:44   Dg Abd Acute W/chest  06/23/2015  CLINICAL DATA:  Recurrent C diff colitis, abdominal pain, diarrhea EXAM: DG ABDOMEN ACUTE W/ 1V CHEST COMPARISON:  05/18/2015 FINDINGS: Cardiomediastinal silhouette is stable. No acute infiltrate or pleural effusion. No pulmonary edema. Bony thorax is unremarkable. There is normal small bowel gas pattern. No free abdominal air. IMPRESSION: Negative abdominal radiographs.  No acute cardiopulmonary disease. Electronically Signed   By: Lahoma Crocker M.D.   On: 06/23/2015 14:41    Microbiology: Recent Results (from the past 240 hour(s))  C difficile quick scan w PCR reflex     Status: None   Collection Time: 07/04/15 11:18 AM  Result Value Ref Range Status   C Diff antigen NEGATIVE NEGATIVE Final   C Diff toxin NEGATIVE NEGATIVE Final   C Diff interpretation Negative for toxigenic C. difficile  Final  Urine culture     Status: Abnormal   Collection Time: 07/04/15 12:27 PM  Result Value Ref Range Status   Specimen Description URINE, CLEAN CATCH  Final   Special Requests NONE  Final   Culture >=100,000 COLONIES/mL KLEBSIELLA PNEUMONIAE (A)  Final   Report Status 07/07/2015 FINAL  Final   Organism ID, Bacteria KLEBSIELLA PNEUMONIAE (A)  Final      Susceptibility   Klebsiella pneumoniae - MIC*    AMPICILLIN >=32 RESISTANT Resistant      CEFAZOLIN <=4 SENSITIVE Sensitive     CEFTRIAXONE <=1 SENSITIVE Sensitive     CIPROFLOXACIN <=0.25 SENSITIVE Sensitive     GENTAMICIN <=1 SENSITIVE Sensitive     IMIPENEM <=0.25 SENSITIVE Sensitive     NITROFURANTOIN 64 INTERMEDIATE Intermediate     TRIMETH/SULFA <=20 SENSITIVE Sensitive     AMPICILLIN/SULBACTAM 4 SENSITIVE Sensitive     PIP/TAZO <=4 SENSITIVE Sensitive     * >=100,000 COLONIES/mL KLEBSIELLA PNEUMONIAE  MRSA PCR Screening     Status: None   Collection Time: 07/05/15  3:13 PM  Result Value Ref Range Status   MRSA by PCR NEGATIVE NEGATIVE Final    Comment:        The GeneXpert MRSA Assay (FDA approved for NASAL specimens only), is one component of a comprehensive MRSA colonization surveillance program. It is not intended to diagnose MRSA infection nor to guide or monitor treatment for MRSA infections.      Labs: Basic Metabolic Panel:  Recent Labs Lab 07/06/15 0004 07/06/15 0319 07/06/15 0733 07/07/15 0500 07/08/15 0608  NA 137 139 135 131* 133*  K 3.7 2.7* 4.6 4.0 4.1  CL 107 109 106 103 104  CO2 19* 21* 18* 24 23  GLUCOSE 239* 103* 232* 162* 150*  BUN 17 16 14 12 12   CREATININE 0.77 0.70 0.63 0.51 0.43*  CALCIUM 8.1* 8.2* 8.4* 8.0* 8.2*   Liver Function Tests:  Recent Labs Lab 07/05/15 1156 07/07/15 0500 07/08/15 0608  AST 192* 30 26  ALT 141* 52 41  ALKPHOS 149* 81 75  BILITOT 1.1 0.3 0.2*  PROT 8.1 5.4* 5.8*  ALBUMIN 5.0 3.1* 3.4*    Recent Labs Lab 07/05/15 1156  LIPASE 15   No results for input(s): AMMONIA in the last 168 hours. CBC:  Recent Labs Lab 07/04/15 0954 07/05/15 1156  WBC 6.1 7.3  HGB 13.8 11.5*  HCT 38.9 33.8*  MCV 81.6 85.1  PLT 179 158   Cardiac Enzymes: No results for input(s): CKTOTAL, CKMB, CKMBINDEX, TROPONINI in the last 168 hours. BNP: BNP (last 3 results) No results for input(s): BNP in the last 8760 hours.  ProBNP (last 3 results) No results for input(s): PROBNP in the last 8760  hours.  CBG:  Recent Labs Lab 07/07/15 2007 07/07/15 2242 07/08/15 0001 07/08/15 0359 07/08/15 0740  GLUCAP 85 254* 287* 157* 151*    Signed:  Kamoni Depree K  [facility] 07/08/2015, 9:37 AM

## 2015-07-08 NOTE — Hospital Discharge Follow-Up (Signed)
The patient is known to the [facility] and has an appointment scheduled with Dr Jarold Song on 07/11/15 @ 1045.  The information was placed on the AVS.

## 2015-07-08 NOTE — Telephone Encounter (Signed)
Post ED Visit - Positive Culture Follow-up  Culture report reviewed by antimicrobial stewardship pharmacist:  []  Elenor Quinones, Pharm.D. []  Heide Guile, Pharm.D., BCPS []  Parks Neptune, Pharm.D. []  Alycia Rossetti, Pharm.D., BCPS []  Haring, Pharm.D., BCPS, AAHIVP []  Legrand Como, Pharm.D., BCPS, AAHIVP [x]  Milus Glazier, Pharm.D. []  Stephens November, Pharm.D.  Positive urine  culture Treated with cephalexin, organism sensitive to the same and no further patient follow-up is required at this time.  Hazle Nordmann 07/08/2015, 10:28 AM

## 2015-07-11 ENCOUNTER — Ambulatory Visit: Payer: Medicaid Other | Attending: Family Medicine | Admitting: Family Medicine

## 2015-07-11 ENCOUNTER — Encounter: Payer: Self-pay | Admitting: Family Medicine

## 2015-07-11 VITALS — BP 91/61 | HR 103 | Temp 98.2°F | Resp 14 | Ht 65.0 in | Wt 90.4 lb

## 2015-07-11 DIAGNOSIS — Z79899 Other long term (current) drug therapy: Secondary | ICD-10-CM | POA: Insufficient documentation

## 2015-07-11 DIAGNOSIS — E1365 Other specified diabetes mellitus with hyperglycemia: Secondary | ICD-10-CM

## 2015-07-11 DIAGNOSIS — G2581 Restless legs syndrome: Secondary | ICD-10-CM | POA: Insufficient documentation

## 2015-07-11 DIAGNOSIS — Z794 Long term (current) use of insulin: Secondary | ICD-10-CM | POA: Diagnosis not present

## 2015-07-11 DIAGNOSIS — N390 Urinary tract infection, site not specified: Secondary | ICD-10-CM | POA: Diagnosis not present

## 2015-07-11 DIAGNOSIS — G8929 Other chronic pain: Secondary | ICD-10-CM | POA: Diagnosis not present

## 2015-07-11 DIAGNOSIS — E1140 Type 2 diabetes mellitus with diabetic neuropathy, unspecified: Secondary | ICD-10-CM | POA: Diagnosis present

## 2015-07-11 DIAGNOSIS — E1043 Type 1 diabetes mellitus with diabetic autonomic (poly)neuropathy: Secondary | ICD-10-CM

## 2015-07-11 DIAGNOSIS — E1049 Type 1 diabetes mellitus with other diabetic neurological complication: Secondary | ICD-10-CM

## 2015-07-11 DIAGNOSIS — G4700 Insomnia, unspecified: Secondary | ICD-10-CM | POA: Insufficient documentation

## 2015-07-11 DIAGNOSIS — E1340 Other specified diabetes mellitus with diabetic neuropathy, unspecified: Secondary | ICD-10-CM | POA: Diagnosis not present

## 2015-07-11 DIAGNOSIS — IMO0002 Reserved for concepts with insufficient information to code with codable children: Secondary | ICD-10-CM

## 2015-07-11 DIAGNOSIS — K0889 Other specified disorders of teeth and supporting structures: Secondary | ICD-10-CM | POA: Diagnosis not present

## 2015-07-11 DIAGNOSIS — K3184 Gastroparesis: Secondary | ICD-10-CM | POA: Insufficient documentation

## 2015-07-11 LAB — GLUCOSE, POCT (MANUAL RESULT ENTRY): POC Glucose: 277 mg/dl — AB (ref 70–99)

## 2015-07-11 MED ORDER — HYDROXYZINE HCL 25 MG PO TABS: 25.0000 mg | ORAL_TABLET | Freq: Three times a day (TID) | ORAL | Status: DC | PRN

## 2015-07-11 MED ORDER — PROMETHAZINE HCL 25 MG PO TABS: 25.0000 mg | ORAL_TABLET | Freq: Four times a day (QID) | ORAL | Status: DC | PRN

## 2015-07-11 MED ORDER — TRAZODONE HCL 50 MG PO TABS: 50.0000 mg | ORAL_TABLET | Freq: Every evening | ORAL | Status: DC | PRN

## 2015-07-11 MED ORDER — PROMETHAZINE HCL 25 MG RE SUPP: 25.0000 mg | Freq: Four times a day (QID) | RECTAL | Status: DC | PRN

## 2015-07-11 MED ORDER — AMITRIPTYLINE HCL 50 MG PO TABS: 100.0000 mg | ORAL_TABLET | Freq: Every day | ORAL | Status: DC

## 2015-07-11 MED ORDER — GABAPENTIN 300 MG PO CAPS: 300.0000 mg | ORAL_CAPSULE | Freq: Every day | ORAL | Status: DC

## 2015-07-11 NOTE — Progress Notes (Signed)
Patient's here for f/up from hospital  For DKA.   Patient states she feels ok today.   Patient reports having abdominal pain, rated 7/10 and tooth pain, rated 8/10.  Patient blood sugar was 277, patient states that she tooke her DM meds around 9:00 am.  Per Dr. Jarold Song, pt will not be given in house insulin.

## 2015-07-11 NOTE — Progress Notes (Signed)
Subjective:  Patient ID: [name], female    DOB: Feb 08, 1981  Age: 35 y.o. MRN: [id number]  CC: Follow-up and Diabetes   HPI [name]  is a 35 year old female with a history of type 1 diabetes mellitus (A1c 11.9 from 06/2015-care everywhere), gastroparesis secondary to diabetes, chronic diarrhea , pancreatic insufficiency with multiple ED visits for severe abdominal pain , chronic diarrhea and hyperglycemia recently hospitalized at [facility] from 07/05/15 through 07/08/15 for early DKA secondary to Klebsiella UTI. She was placed on IV fluids and anti-emetics for nausea and vomiting; urine culture was positive for Klebsiella for which she was on cephalosporin. Liver enzymes were elevated on admission but trended down to normal at the time of discharge.  Diabetes mellitus is managed by endocrinology- Dr Madelon Lips at [facility]. She was referred to a gastroenterologist whom she requested but was informed by the practice that they did not take Medicaid and she would like to be referred to another gastroenterologist. She continues to have chronic diarrhea and wakes up multiple times during the night to change her diaper and this is affecting her sleep. Had diarrhea has been worked up by several gastroenterologist and this has been persistent for the last 1-1/2 years and she has been unable to draw an association between the diarrhea and certain kinds of foods.  She does have pain in her teeth and was seen by her dentist yesterday (has had several tooth extractions); informed that this is secondary to excess acidity from frequent vomiting     Outpatient Prescriptions Prior to Visit  Medication Sig Dispense Refill  . acetaminophen (TYLENOL) 500 MG tablet Take 1,000 mg by mouth every 6 (six) hours as needed for moderate pain.     Marland Kitchen acetaminophen-codeine (TYLENOL #3) 300-30 MG tablet Take 1 tablet by mouth every 12 (twelve) hours as needed for moderate pain. 60 tablet 1  .  CALCIUM PO Take 2 tablets by mouth daily.    . cholestyramine (QUESTRAN) 4 G packet Take 1 packet (4 g total) by mouth 2 (two) times daily. (Patient taking differently: Take 4 g by mouth 4 (four) times daily. ) 60 each 12  . CREON 36000 UNITS CPEP capsule Take 4-6 capsules by mouth. Takes 6 capsules with every meal and 4 capsules with every snack.  1  . cyanocobalamin 500 MCG tablet Take 2,000 mcg by mouth 2 (two) times daily as needed (mouth sores).     . cyclobenzaprine (FLEXERIL) 10 MG tablet Take 10 mg by mouth 2 (two) times daily as needed. Dental problems  0  . dicyclomine (BENTYL) 10 MG capsule Take 10 mg by mouth 4 (four) times daily -  before meals and at bedtime. Reported on 04/01/2015    . diphenoxylate-atropine (LOMOTIL) 2.5-0.025 MG per tablet Take 1 tablet by mouth 4 (four) times daily as needed for diarrhea or loose stools. 30 tablet 0  . feeding supplement, GLUCERNA SHAKE, (GLUCERNA SHAKE) LIQD Take 237 mLs by mouth 3 (three) times daily between meals. (Patient taking differently: Take 237 mLs by mouth 4 (four) times daily. ) 90 Can 0  . glucagon (GLUCAGON EMERGENCY) 1 MG injection Inject 1 mg into the muscle once as needed (severe hypoglycemia). 2 each prn  . insulin lispro (HUMALOG) 100 UNIT/ML injection Inject 1-5 Units into the skin daily.    . Multiple Vitamin (MULTIVITAMIN WITH MINERALS) TABS Take 2 tablets by mouth daily. Diabetic support vitamin    . omeprazole (PRILOSEC) 40  MG capsule Take 40 mg by mouth daily as needed (indigestion).     . Probiotic Product (DIGESTIVE ADVANTAGE GUMMIES PO) Take 2 tablets by mouth 2 (two) times daily.    Marland Kitchen saccharomyces boulardii (FLORASTOR) 250 MG capsule Take 1 capsule (250 mg total) by mouth 2 (two) times daily. 60 capsule 2  . amitriptyline (ELAVIL) 50 MG tablet Take 2 tablets (100 mg total) by mouth at bedtime. 60 tablet 0  . gabapentin (NEURONTIN) 100 MG capsule Take 300 mg by mouth at bedtime.     Marland Kitchen guaifenesin (ROBITUSSIN) 100 MG/5ML  syrup Take 200 mg by mouth 3 (three) times daily as needed for cough.    . hydrOXYzine (ATARAX/VISTARIL) 25 MG tablet Take 1 tablet (25 mg total) by mouth 3 (three) times daily as needed. (Patient taking differently: Take 25 mg by mouth 3 (three) times daily as needed for itching. ) 90 tablet 1  . prochlorperazine (COMPAZINE) 10 MG tablet Take 1 tablet (10 mg total) by mouth 2 (two) times daily as needed for nausea or vomiting. 30 tablet 0  . promethazine (PHENERGAN) 25 MG suppository Place 1 suppository (25 mg total) rectally every 6 (six) hours as needed for nausea or vomiting. 30 suppository 2  . promethazine (PHENERGAN) 25 MG tablet Take 1 tablet (25 mg total) by mouth every 6 (six) hours as needed for nausea or vomiting. 12 tablet 0  . traZODone (DESYREL) 50 MG tablet Take 1 tablet (50 mg total) by mouth at bedtime as needed for sleep. 30 tablet 2  . cefUROXime (CEFTIN) 250 MG tablet Take 1 tablet (250 mg total) by mouth 2 (two) times daily with a meal. (Patient not taking: Reported on 07/11/2015) 6 tablet 0  . HYDROcodone-acetaminophen (NORCO/VICODIN) 5-325 MG tablet Take 1 tablet by mouth every 4 (four) hours as needed (dental pain). Reported on 07/11/2015  0  . ALPRAZolam (XANAX) 1 MG tablet Take 1 mg by mouth 2 (two) times daily as needed for anxiety. Reported on 07/11/2015    . Eluxadoline (VIBERZI) 100 MG TABS Take 100 mg by mouth 2 (two) times daily. Reported on 07/05/2015    . insulin aspart (NOVOLOG) 100 UNIT/ML FlexPen Inject 2 Units into the skin 3 (three) times daily with meals. (Patient not taking: Reported on 07/11/2015) 15 mL 0  . insulin glargine (LANTUS) 100 UNIT/ML injection Inject 0.07-0.08 mLs (7-8 Units total) into the skin 2 (two) times daily. 8 units in the morning and 7 units at night (Patient not taking: Reported on 07/04/2015) 10 mL 3   No facility-administered medications prior to visit.    ROS Review of Systems General: negative for fever, weight loss, appetite  change Eyes: no visual symptoms. ENT: no ear symptoms, no sinus tenderness, no nasal congestion or sore throat, complains of toothache Neck: no pain  Respiratory: no wheezing, shortness of breath, cough Cardiovascular: no chest pain, no dyspnea on exertion, no pedal edema, no orthopnea. Gastrointestinal: see hpi Genito-Urinary: no urinary frequency, no dysuria, no polyuria. Hematologic: no bruising Endocrine: no cold or heat intolerance Neurological: no headaches, no seizures, no tremors Musculoskeletal: no joint pains, no joint swelling Skin: no pruritus, no rash. Psychological: no depression, positive for anxiety,  Objective:  BP 91/61 mmHg  Pulse 103  Temp(Src) 98.2 F (36.8 C) (Oral)  Resp 14  Ht 5\' 5"  (1.651 m)  Wt 90 lb 6.4 oz (41.005 kg)  BMI 15.04 kg/m2  SpO2 99%  LMP 02/09/2013  BP/Weight 07/11/2015 07/08/2015 0000000  Systolic BP 91  [id number] -  Diastolic BP 61 69 -  Wt. (Lbs) 90.4 - 85  BMI 15.04 - 14.14      Physical Exam  Constitutional: She is oriented to person, place, and time.  Thin appearing  HENT:  Multiple absent teeth and associated dental caries  Cardiovascular: Normal heart sounds and intact distal pulses.  Tachycardia present.   No murmur heard. Pulmonary/Chest: Effort normal and breath sounds normal. She has no wheezes. She has no rales. She exhibits no tenderness.  Abdominal: Soft. Bowel sounds are normal. She exhibits no distension and no mass. There is tenderness (diffuse abdominal tenderness).  Musculoskeletal: Normal range of motion.  Neurological: She is alert and oriented to person, place, and time.     Assessment & Plan:   1. Uncontrolled other specified diabetes mellitus with diabetic neuropathy, with long-term current use of insulin ([name]) Uncontrolled with A1c of 11.9 (from 06/2015-K everywhere) Continue Toujeo 16 units daily in the morning and Humalog 1-5 units 3 times daily with meals with additional correction factor of 1 unit for  every 100 mg/DL above 200 Keep appointment with endocrine - Glucose (CBG)  2. Diabetic gastroparesis associated with type 1 diabetes mellitus (HCC) - promethazine (PHENERGAN) 25 MG tablet; Take 1 tablet (25 mg total) by mouth every 6 (six) hours as needed for nausea or vomiting.  Dispense: 30 tablet; Refill: 2 - promethazine (PHENERGAN) 25 MG suppository; Place 1 suppository (25 mg total) rectally every 6 (six) hours as needed for nausea or vomiting.  Dispense: 30 suppository; Refill: 2 - Ambulatory referral to Gastroenterology  3. Tooth ache Closely followed by a  dentist whom she saw yesterday  4. Insomnia This is exacerbated by her chronic diarrhea and need to wake up to change her diaper - traZODone (DESYREL) 50 MG tablet; Take 1 tablet (50 mg total) by mouth at bedtime as needed for sleep.  Dispense: 30 tablet; Refill: 2  5. Restless leg syndrome Controlled - amitriptyline (ELAVIL) 50 MG tablet; Take 2 tablets (100 mg total) by mouth at bedtime. For Restless leg  Dispense: 60 tablet; Refill: 3  6. Other diabetic neurological complication associated with type 1 diabetes mellitus (HCC) - gabapentin (NEURONTIN) 300 MG capsule; Take 1 capsule (300 mg total) by mouth at bedtime.  Dispense: 30 capsule; Refill: 3 Also seeing [facility] for chronic pain  7. UTI Asymptomatic Currently on antibiotics.   Meds ordered this encounter  Medications  . promethazine (PHENERGAN) 25 MG tablet    Sig: Take 1 tablet (25 mg total) by mouth every 6 (six) hours as needed for nausea or vomiting.    Dispense:  30 tablet    Refill:  2  . promethazine (PHENERGAN) 25 MG suppository    Sig: Place 1 suppository (25 mg total) rectally every 6 (six) hours as needed for nausea or vomiting.    Dispense:  30 suppository    Refill:  2  . amitriptyline (ELAVIL) 50 MG tablet    Sig: Take 2 tablets (100 mg total) by mouth at bedtime. For Restless leg    Dispense:  60 tablet    Refill:  3  .  gabapentin (NEURONTIN) 300 MG capsule    Sig: Take 1 capsule (300 mg total) by mouth at bedtime.    Dispense:  30 capsule    Refill:  3  . traZODone (DESYREL) 50 MG tablet    Sig: Take 1 tablet (50 mg total) by mouth at bedtime as needed for sleep.  Dispense:  30 tablet    Refill:  2  . hydrOXYzine (ATARAX/VISTARIL) 25 MG tablet    Sig: Take 1 tablet (25 mg total) by mouth 3 (three) times daily as needed.    Dispense:  90 tablet    Refill:  2    Follow-up: Return in about 6 weeks (around 08/22/2015) for Coordination of care.   Total time spent: 45 minutes and greater than half of the time spent in counseling on diagnosis, reviewing medications and discussing indications and coordination of care.  Arnoldo Morale MD

## 2015-07-11 NOTE — Patient Instructions (Signed)

## 2015-07-15 ENCOUNTER — Inpatient Hospital Stay: Payer: Self-pay | Admitting: Family Medicine

## 2015-10-15 ENCOUNTER — Encounter (HOSPITAL_COMMUNITY): Payer: Self-pay

## 2015-10-15 ENCOUNTER — Inpatient Hospital Stay (HOSPITAL_COMMUNITY)
Admission: EM | Admit: 2015-10-15 | Discharge: 2015-10-17 | DRG: 638 | Discharge status: Against Medical Advice | Payer: Medicaid Other | Attending: Internal Medicine | Admitting: Internal Medicine

## 2015-10-15 DIAGNOSIS — E109 Type 1 diabetes mellitus without complications: Secondary | ICD-10-CM | POA: Diagnosis present

## 2015-10-15 DIAGNOSIS — R112 Nausea with vomiting, unspecified: Secondary | ICD-10-CM | POA: Diagnosis not present

## 2015-10-15 DIAGNOSIS — E1010 Type 1 diabetes mellitus with ketoacidosis without coma: Secondary | ICD-10-CM | POA: Diagnosis not present

## 2015-10-15 DIAGNOSIS — K529 Noninfective gastroenteritis and colitis, unspecified: Secondary | ICD-10-CM | POA: Diagnosis present

## 2015-10-15 DIAGNOSIS — E869 Volume depletion, unspecified: Secondary | ICD-10-CM | POA: Diagnosis present

## 2015-10-15 DIAGNOSIS — Z91040 Latex allergy status: Secondary | ICD-10-CM

## 2015-10-15 DIAGNOSIS — F329 Major depressive disorder, single episode, unspecified: Secondary | ICD-10-CM | POA: Diagnosis present

## 2015-10-15 DIAGNOSIS — Z885 Allergy status to narcotic agent status: Secondary | ICD-10-CM

## 2015-10-15 DIAGNOSIS — Z794 Long term (current) use of insulin: Secondary | ICD-10-CM

## 2015-10-15 DIAGNOSIS — Z888 Allergy status to other drugs, medicaments and biological substances status: Secondary | ICD-10-CM

## 2015-10-15 DIAGNOSIS — R000 Tachycardia, unspecified: Secondary | ICD-10-CM | POA: Diagnosis not present

## 2015-10-15 DIAGNOSIS — Z87891 Personal history of nicotine dependence: Secondary | ICD-10-CM

## 2015-10-15 DIAGNOSIS — E875 Hyperkalemia: Secondary | ICD-10-CM | POA: Diagnosis present

## 2015-10-15 DIAGNOSIS — R197 Diarrhea, unspecified: Secondary | ICD-10-CM

## 2015-10-15 DIAGNOSIS — E1110 Type 2 diabetes mellitus with ketoacidosis without coma: Secondary | ICD-10-CM | POA: Diagnosis present

## 2015-10-15 DIAGNOSIS — Z79899 Other long term (current) drug therapy: Secondary | ICD-10-CM

## 2015-10-15 DIAGNOSIS — R0902 Hypoxemia: Secondary | ICD-10-CM

## 2015-10-15 DIAGNOSIS — Z91013 Allergy to seafood: Secondary | ICD-10-CM

## 2015-10-15 DIAGNOSIS — E871 Hypo-osmolality and hyponatremia: Secondary | ICD-10-CM | POA: Diagnosis present

## 2015-10-15 LAB — URINE MICROSCOPIC-ADD ON
BACTERIA UA: NONE SEEN
RBC / HPF: NONE SEEN RBC/hpf (ref 0–5)

## 2015-10-15 LAB — BASIC METABOLIC PANEL
Anion gap: 30 — ABNORMAL HIGH (ref 5–15)
Anion gap: 19 — ABNORMAL HIGH (ref 5–15)
BUN: 27 mg/dL — AB (ref 6–20)
BUN: 26 mg/dL — AB (ref 6–20)
CALCIUM: 9.2 mg/dL (ref 8.9–10.3)
CALCIUM: 8.6 mg/dL — AB (ref 8.9–10.3)
CO2: 7 mmol/L — AB (ref 22–32)
CO2: 11 mmol/L — AB (ref 22–32)
CREATININE: 1.26 mg/dL — AB (ref 0.44–1.00)
CREATININE: 1.05 mg/dL — AB (ref 0.44–1.00)
Chloride: 85 mmol/L — ABNORMAL LOW (ref 101–111)
Chloride: 98 mmol/L — ABNORMAL LOW (ref 101–111)
GFR calc Af Amer: >60 mL/min (ref >60)
GFR calc Af Amer: >60 mL/min (ref >60)
GFR calc non Af Amer: >60 mL/min (ref >60)
GFR, EST NON AFRICAN AMERICAN: 55 mL/min — AB (ref >60)
GLUCOSE: 919 mg/dL — AB (ref 65–99)
GLUCOSE: 457 mg/dL — AB (ref 65–99)
Potassium: 5.8 mmol/L — ABNORMAL HIGH (ref 3.5–5.1)
Potassium: 4.4 mmol/L (ref 3.5–5.1)
Sodium: 122 mmol/L — ABNORMAL LOW (ref 135–145)
Sodium: 128 mmol/L — ABNORMAL LOW (ref 135–145)

## 2015-10-15 LAB — GLUCOSE, CAPILLARY
GLUCOSE-CAPILLARY: 594 mg/dL — AB (ref 65–99)
Glucose-Capillary: 431 mg/dL — ABNORMAL HIGH (ref 65–99)

## 2015-10-15 LAB — COMPREHENSIVE METABOLIC PANEL
ALBUMIN: 4.6 g/dL (ref 3.5–5.0)
ALK PHOS: 178 U/L — AB (ref 38–126)
ALT: 84 U/L — AB (ref 14–54)
ANION GAP: 32 — AB (ref 5–15)
AST: 177 U/L — ABNORMAL HIGH (ref 15–41)
BUN: 29 mg/dL — ABNORMAL HIGH (ref 6–20)
CHLORIDE: 82 mmol/L — AB (ref 101–111)
CO2: 8 mmol/L — AB (ref 22–32)
Calcium: 10 mg/dL (ref 8.9–10.3)
Creatinine, Ser: 1.28 mg/dL — ABNORMAL HIGH (ref 0.44–1.00)
GFR calc non Af Amer: 53 mL/min — ABNORMAL LOW (ref >60)
GLUCOSE: 939 mg/dL — AB (ref 65–99)
Potassium: 6.6 mmol/L (ref 3.5–5.1)
SODIUM: 122 mmol/L — AB (ref 135–145)
Total Bilirubin: 1.6 mg/dL — ABNORMAL HIGH (ref 0.3–1.2)
Total Protein: 7.7 g/dL (ref 6.5–8.1)

## 2015-10-15 LAB — URINALYSIS, ROUTINE W REFLEX MICROSCOPIC
BILIRUBIN URINE: NEGATIVE
Glucose, UA: >1000 mg/dL — AB
Hgb urine dipstick: NEGATIVE
LEUKOCYTES UA: NEGATIVE
NITRITE: NEGATIVE
PH: 5.5 (ref 5.0–8.0)
Protein, ur: NEGATIVE mg/dL
SPECIFIC GRAVITY, URINE: 1.027 (ref 1.005–1.030)

## 2015-10-15 LAB — POC URINE PREG, ED: PREG TEST UR: NEGATIVE

## 2015-10-15 LAB — CBC WITH DIFFERENTIAL/PLATELET
BASOS ABS: 0 10*3/uL (ref 0.0–0.1)
Basophils Relative: 0 %
EOS ABS: 0 10*3/uL (ref 0.0–0.7)
Eosinophils Relative: 0 %
HEMATOCRIT: 34 % — AB (ref 36.0–46.0)
HEMOGLOBIN: 10.4 g/dL — AB (ref 12.0–15.0)
LYMPHS PCT: 8 %
Lymphs Abs: 1 10*3/uL (ref 0.7–4.0)
MCH: 28.7 pg (ref 26.0–34.0)
MCHC: 30.6 g/dL (ref 30.0–36.0)
MCV: 93.7 fL (ref 78.0–100.0)
MONOS PCT: 9 %
Monocytes Absolute: 1.2 10*3/uL — ABNORMAL HIGH (ref 0.1–1.0)
NEUTROS PCT: 83 %
Neutro Abs: 10.6 10*3/uL — ABNORMAL HIGH (ref 1.7–7.7)
Platelets: 466 10*3/uL — ABNORMAL HIGH (ref 150–400)
RBC: 3.63 MIL/uL — AB (ref 3.87–5.11)
RDW: 14.9 % (ref 11.5–15.5)
WBC: 12.8 10*3/uL — AB (ref 4.0–10.5)

## 2015-10-15 LAB — CBG MONITORING, ED: Glucose-Capillary: >600 mg/dL (ref 65–99)

## 2015-10-15 LAB — BLOOD GAS, VENOUS
Acid-base deficit: 19 mmol/L — ABNORMAL HIGH (ref 0.0–2.0)
Bicarbonate: 8.3 mEq/L — ABNORMAL LOW (ref 20.0–24.0)
O2 Saturation: 72.4 %
PATIENT TEMPERATURE: 37
PCO2 VEN: 24 mmHg — AB (ref 45.0–50.0)
PH VEN: 7.164 — AB (ref 7.250–7.300)
TCO2: 8.1 mmol/L (ref 0–100)
pO2, Ven: 50 mmHg — ABNORMAL HIGH (ref 31.0–45.0)

## 2015-10-15 LAB — I-STAT CHEM 8, ED
BUN: 28 mg/dL — AB (ref 6–20)
CHLORIDE: 88 mmol/L — AB (ref 101–111)
Calcium, Ion: 1.19 mmol/L (ref 1.13–1.30)
Creatinine, Ser: 0.8 mg/dL (ref 0.44–1.00)
Glucose, Bld: >700 mg/dL (ref 65–99)
HEMATOCRIT: 37 % (ref 36.0–46.0)
Hemoglobin: 12.6 g/dL (ref 12.0–15.0)
POTASSIUM: 6.5 mmol/L — AB (ref 3.5–5.1)
SODIUM: 119 mmol/L — AB (ref 135–145)
TCO2: 11 mmol/L (ref 0–100)

## 2015-10-15 LAB — LIPASE, BLOOD: Lipase: 11 U/L (ref 11–51)

## 2015-10-15 LAB — MRSA PCR SCREENING: MRSA BY PCR: NEGATIVE

## 2015-10-15 MED ORDER — HYDROMORPHONE HCL 1 MG/ML IJ SOLN
1.0000 mg | INTRAMUSCULAR | Status: DC | PRN
  Administered 2015-10-15: 2 mg via INTRAVENOUS
  Filled 2015-10-15: qty 2

## 2015-10-15 MED ORDER — PANCRELIPASE (LIP-PROT-AMYL) 36000-114000 UNITS PO CPEP
6.0000 | ORAL_CAPSULE | Freq: Three times a day (TID) | ORAL | Status: DC
  Administered 2015-10-16 (×3): 216000 [IU] via ORAL
  Filled 2015-10-15 (×5): qty 6

## 2015-10-15 MED ORDER — SODIUM CHLORIDE 0.9 % IV SOLN
INTRAVENOUS | Status: DC
  Administered 2015-10-15: 5.4 [IU]/h via INTRAVENOUS
  Filled 2015-10-15: qty 2.5

## 2015-10-15 MED ORDER — RISAQUAD PO CAPS
1.0000 | ORAL_CAPSULE | Freq: Every day | ORAL | Status: DC
  Administered 2015-10-16 – 2015-10-17 (×2): 1 via ORAL
  Filled 2015-10-15 (×2): qty 1

## 2015-10-15 MED ORDER — PANTOPRAZOLE SODIUM 40 MG PO TBEC: 80.0000 mg | DELAYED_RELEASE_TABLET | Freq: Every day | ORAL | Status: DC | PRN

## 2015-10-15 MED ORDER — SODIUM CHLORIDE 0.9 % IV SOLN
INTRAVENOUS | Status: AC
  Administered 2015-10-15 (×2): via INTRAVENOUS

## 2015-10-15 MED ORDER — ADULT MULTIVITAMIN W/MINERALS CH
1.0000 | ORAL_TABLET | Freq: Every day | ORAL | Status: DC
  Administered 2015-10-15 – 2015-10-17 (×3): 1 via ORAL
  Filled 2015-10-15 (×3): qty 1

## 2015-10-15 MED ORDER — AMITRIPTYLINE HCL 25 MG PO TABS
100.0000 mg | ORAL_TABLET | Freq: Every day | ORAL | Status: DC
  Administered 2015-10-15 – 2015-10-16 (×2): 100 mg via ORAL
  Filled 2015-10-15 (×2): qty 4

## 2015-10-15 MED ORDER — GABAPENTIN 300 MG PO CAPS
300.0000 mg | ORAL_CAPSULE | Freq: Every day | ORAL | Status: DC
  Administered 2015-10-15 – 2015-10-16 (×2): 300 mg via ORAL
  Filled 2015-10-15 (×2): qty 1

## 2015-10-15 MED ORDER — CYCLOBENZAPRINE HCL 10 MG PO TABS: 10.0000 mg | ORAL_TABLET | Freq: Three times a day (TID) | ORAL | Status: DC | PRN

## 2015-10-15 MED ORDER — SODIUM CHLORIDE 0.9 % IV SOLN
INTRAVENOUS | Status: DC
  Administered 2015-10-15 – 2015-10-16 (×2): via INTRAVENOUS

## 2015-10-15 MED ORDER — DEXTROSE-NACL 5-0.45 % IV SOLN
INTRAVENOUS | Status: DC
  Administered 2015-10-16: 03:00:00 via INTRAVENOUS

## 2015-10-15 MED ORDER — SODIUM CHLORIDE 0.9 % IV SOLN
INTRAVENOUS | Status: DC
  Filled 2015-10-15: qty 2.5

## 2015-10-15 MED ORDER — PROMETHAZINE HCL 25 MG/ML IJ SOLN
25.0000 mg | Freq: Four times a day (QID) | INTRAMUSCULAR | Status: DC | PRN
  Administered 2015-10-15: 25 mg via INTRAMUSCULAR
  Filled 2015-10-15: qty 1

## 2015-10-15 MED ORDER — ENOXAPARIN SODIUM 30 MG/0.3ML ~~LOC~~ SOLN
30.0000 mg | SUBCUTANEOUS | Status: DC
  Administered 2015-10-15 – 2015-10-16 (×2): 30 mg via SUBCUTANEOUS
  Filled 2015-10-15 (×3): qty 0.3

## 2015-10-15 MED ORDER — CHOLESTYRAMINE 4 G PO PACK
4.0000 g | PACK | Freq: Two times a day (BID) | ORAL | Status: DC
  Administered 2015-10-15 – 2015-10-16 (×3): 4 g via ORAL
  Filled 2015-10-15 (×4): qty 1

## 2015-10-15 MED ORDER — SACCHAROMYCES BOULARDII 250 MG PO CAPS
250.0000 mg | ORAL_CAPSULE | Freq: Two times a day (BID) | ORAL | Status: DC
  Administered 2015-10-15 – 2015-10-17 (×4): 250 mg via ORAL
  Filled 2015-10-15 (×4): qty 1

## 2015-10-15 MED ORDER — ONDANSETRON HCL 4 MG/2ML IJ SOLN
4.0000 mg | Freq: Four times a day (QID) | INTRAMUSCULAR | Status: DC | PRN
  Administered 2015-10-15: 4 mg via INTRAVENOUS
  Filled 2015-10-15: qty 2

## 2015-10-15 MED ORDER — CALCIUM GLUCONATE 500 MG PO TABS
2.0000 | ORAL_TABLET | Freq: Every day | ORAL | Status: DC
  Administered 2015-10-16 – 2015-10-17 (×2): 1000 mg via ORAL
  Filled 2015-10-15 (×2): qty 2

## 2015-10-15 MED ORDER — SODIUM CHLORIDE 0.9 % IV BOLUS (SEPSIS): 1000.0000 mL | Freq: Once | INTRAVENOUS | Status: DC

## 2015-10-15 MED ORDER — SODIUM CHLORIDE 0.9 % IV BOLUS (SEPSIS)
1000.0000 mL | Freq: Once | INTRAVENOUS | Status: AC
  Administered 2015-10-15: 1000 mL via INTRAVENOUS

## 2015-10-15 MED ORDER — DIPHENOXYLATE-ATROPINE 2.5-0.025 MG PO TABS: 1.0000 | ORAL_TABLET | Freq: Four times a day (QID) | ORAL | Status: DC | PRN

## 2015-10-15 MED ORDER — TRAZODONE HCL 50 MG PO TABS: 50.0000 mg | ORAL_TABLET | Freq: Every evening | ORAL | Status: DC | PRN

## 2015-10-15 MED ORDER — PROMETHAZINE HCL 25 MG/ML IJ SOLN
12.5000 mg | Freq: Four times a day (QID) | INTRAMUSCULAR | Status: DC | PRN
  Administered 2015-10-16: 25 mg via INTRAVENOUS
  Administered 2015-10-16 – 2015-10-17 (×2): 12.5 mg via INTRAVENOUS
  Filled 2015-10-15 (×2): qty 1

## 2015-10-15 MED ORDER — FENTANYL CITRATE (PF) 100 MCG/2ML IJ SOLN
50.0000 ug | Freq: Once | INTRAMUSCULAR | Status: AC
  Administered 2015-10-15: 50 ug via INTRAVENOUS
  Filled 2015-10-15: qty 2

## 2015-10-15 MED ORDER — CALCIUM 75 MG PO TABS
ORAL_TABLET | Freq: Every day | ORAL | Status: DC
Start: 2015-10-15 — End: 2015-10-15

## 2015-10-15 MED ORDER — DICYCLOMINE HCL 10 MG PO CAPS
10.0000 mg | ORAL_CAPSULE | Freq: Three times a day (TID) | ORAL | Status: DC
  Administered 2015-10-15 – 2015-10-17 (×6): 10 mg via ORAL
  Filled 2015-10-15 (×7): qty 1

## 2015-10-15 NOTE — ED Notes (Signed)
Accidentally triaged under Sandre Kitty, NT

## 2015-10-15 NOTE — ED Provider Notes (Signed)
[facility] DEPT Provider Note   CSN: [id number] Arrival date & time: 10/15/15  1437     History   Chief Complaint Chief Complaint  Patient presents with  . Hyperglycemia    HPI [name] is a 35 y.o. female.  The history is provided by the patient. No language interpreter was used.  Hyperglycemia  [name] is a 35 y.o. female who presents to the Emergency Department complaining of hyperglycemia.  She has a history of type 1 diabetes and uses the Omni pod insulin pumps. She changes them every 3 days. Her pump expired this morning and shortly thereafter she developed shortness of breath, nausea, abdominal cramps, vomiting. Symptoms feel similar to prior episodes of DKA. She states that in order to change her Omni pod she needs her PDM glucometer but could not find it. She did not bring the pump or pot with her to the emergency department.  Past Medical History:  Diagnosis Date  . Diabetes mellitus without complication ([place])    TYPE I    Patient Active Problem List   Diagnosis Date Noted  . Chronic diarrhea 10/15/2015  . Volume depletion 10/15/2015  . Sinus tachycardia ([place]) 10/15/2015  . Restless leg syndrome 07/11/2015  . Diabetic neuropathy ([place]) 07/11/2015  . DKA (diabetic ketoacidoses) ([place]) 07/05/2015  . Anxiety state 05/26/2015  . Hyperglycemia due to type 1 diabetes mellitus ([place]) 05/03/2015  . Nausea vomiting and diarrhea 05/03/2015  . Insomnia 04/01/2015  . Malnutrition of moderate degree 03/12/2015  . DM type 1 (diabetes mellitus, type 1) ([place]) 03/11/2015  . Hyperglycemic hyperosmolar nonketotic coma ([place]) 03/11/2015  . Diarrhea due to malabsorption 03/11/2015  . Hyperglycemia 03/11/2015  . Hyperkalemia 01/11/2015  . Exocrine pancreatic insufficiency ([place]) 07/30/2014  . Adjustment disorder with mixed anxiety and depressed mood 07/27/2014  . Personal history of noncompliance with medical treatment 06/22/2014  . Protein-calorie malnutrition,  severe ([place]) 05/31/2014  . Diabetic gastroparesis associated with type 1 diabetes mellitus ([place]) 03/03/2014    Past Surgical History:  Procedure Laterality Date  . BACK SURGERY     age 89  . ESOPHAGOGASTRODUODENOSCOPY (EGD) WITH PROPOFOL N/A 06/27/2014   Procedure: ESOPHAGOGASTRODUODENOSCOPY (EGD) WITH PROPOFOL;  Surgeon: Milus Banister, MD;  Location: [facility] ENDOSCOPY;  Service: Endoscopy;  Laterality: N/A;  . FLEXIBLE SIGMOIDOSCOPY N/A 06/24/2014   Procedure: FLEXIBLE SIGMOIDOSCOPY;  Surgeon: Milus Banister, MD;  Location: [facility] ENDOSCOPY;  Service: Endoscopy;  Laterality: N/A;  . LAPAROSCOPY  02/2002, 06/2010   laparoscopy with lysis pelvic adhesions for pelvic pain  . ROOT CANAL  10/10/12  . TOOTH EXTRACTION      OB History    No data available       Home Medications    Prior to Admission medications   Medication Sig Start Date End Date Taking? Authorizing Provider  acetaminophen (TYLENOL) 500 MG tablet Take 1,000 mg by mouth every 6 (six) hours as needed for moderate pain.    Yes Historical Provider, MD  amitriptyline (ELAVIL) 50 MG tablet Take 2 tablets (100 mg total) by mouth at bedtime. For Restless leg 07/11/15  Yes Arnoldo Morale, MD  CALCIUM PO Take 2 tablets by mouth daily.   Yes Historical Provider, MD  cholestyramine Lucrezia Starch) 4 G packet Take 1 packet (4 g total) by mouth 2 (two) times daily. Patient taking differently: Take 4 g by mouth 4 (four) times daily.  09/19/14  Yes Jeffrey Hedges, PA-C  CREON 36000 UNITS CPEP capsule Take 4-6 capsules by mouth 6 (  six) times daily. Takes 6 capsules with every meal and 4 capsules with every snack.  12/02/14  Yes Historical Provider, MD  cyanocobalamin 500 MCG tablet Take 2,000 mcg by mouth 2 (two) times daily as needed (mouth sores).    Yes Historical Provider, MD  cyclobenzaprine (FLEXERIL) 10 MG tablet Take 10 mg by mouth 2 (two) times daily as needed. Dental problems 06/19/15  Yes Historical Provider, MD  dicyclomine (BENTYL) 10 MG capsule  Take 10 mg by mouth 4 (four) times daily -  before meals and at bedtime. Reported on 04/01/2015   Yes Historical Provider, MD  diphenoxylate-atropine (LOMOTIL) 2.5-0.025 MG per tablet Take 1 tablet by mouth 4 (four) times daily as needed for diarrhea or loose stools. 11/08/14  Yes Orpah Greek, MD  feeding supplement, GLUCERNA SHAKE, (GLUCERNA SHAKE) LIQD Take 237 mLs by mouth 3 (three) times daily between meals. Patient taking differently: Take 237 mLs by mouth 4 (four) times daily.  07/06/14  Yes Thurnell Lose, MD  gabapentin (NEURONTIN) 300 MG capsule Take 1 capsule (300 mg total) by mouth at bedtime. 07/11/15  Yes Arnoldo Morale, MD  glucagon (GLUCAGON EMERGENCY) 1 MG injection Inject 1 mg into the muscle once as needed (severe hypoglycemia). 08/14/14  Yes Arnoldo Morale, MD  hydrOXYzine (ATARAX/VISTARIL) 25 MG tablet Take 1 tablet (25 mg total) by mouth 3 (three) times daily as needed. Patient taking differently: Take 25 mg by mouth 3 (three) times daily as needed for anxiety or itching.  07/11/15  Yes Arnoldo Morale, MD  hyoscyamine (LEVSIN SL) 0.125 MG SL tablet Place 0.125 mg under the tongue every 4 (four) hours as needed for cramping or pain. 09/15/15  Yes Historical Provider, MD  insulin lispro (HUMALOG) 100 UNIT/ML injection Inject 16 Units into the skin daily. Basal rate is 10 units. Bolus is 1-2 units TID   Yes Historical Provider, MD  Multiple Vitamin (MULTIVITAMIN WITH MINERALS) TABS Take 2 tablets by mouth daily. Diabetic support vitamin   Yes Historical Provider, MD  omeprazole (PRILOSEC) 40 MG capsule Take 40 mg by mouth daily as needed (indigestion).    Yes Historical Provider, MD  oxyCODONE-acetaminophen (PERCOCET) 7.5-325 MG tablet Take 1 tablet by mouth every 4 (four) hours as needed for pain. 09/17/15  Yes Historical Provider, MD  Probiotic Product (DIGESTIVE ADVANTAGE GUMMIES PO) Take 2 tablets by mouth 2 (two) times daily.   Yes Historical Provider, MD  promethazine (PHENERGAN)  25 MG suppository Place 1 suppository (25 mg total) rectally every 6 (six) hours as needed for nausea or vomiting. 07/11/15  Yes Arnoldo Morale, MD  promethazine (PHENERGAN) 25 MG tablet Take 1 tablet (25 mg total) by mouth every 6 (six) hours as needed for nausea or vomiting. 07/11/15  Yes Arnoldo Morale, MD  saccharomyces boulardii (FLORASTOR) 250 MG capsule Take 1 capsule (250 mg total) by mouth 2 (two) times daily. 07/06/14  Yes Thurnell Lose, MD  traZODone (DESYREL) 50 MG tablet Take 1 tablet (50 mg total) by mouth at bedtime as needed for sleep. Patient taking differently: Take 50 mg by mouth at bedtime.  07/11/15  Yes Arnoldo Morale, MD  acetaminophen-codeine (TYLENOL #3) 300-30 MG tablet Take 1 tablet by mouth every 12 (twelve) hours as needed for moderate pain. Patient not taking: Reported on 10/15/2015 04/30/15   Arnoldo Morale, MD  cefUROXime (CEFTIN) 250 MG tablet Take 1 tablet (250 mg total) by mouth 2 (two) times daily with a meal. Patient not taking: Reported on 07/11/2015 07/08/15  Donne Hazel, MD    Family History Family History  Problem Relation Age of Onset  . Diabetes Maternal Grandmother   . Cancer Maternal Grandmother     Leukemia  . Cancer Maternal Grandfather     Small cell lung cancer    Social History Social History  Substance Use Topics  . Smoking status: Former Smoker    Quit date: 04/19/2000  . Smokeless tobacco: Former Systems developer  . Alcohol use No     Comment: RARE SIPS OF WINE     Allergies   Morphine; [place] [shellfish allergy]; Betamethasone; Diphenhydramine hcl; Haloperidol and related; Ketorolac; Morphine and related; Toradol [ketorolac tromethamine]; Latex; and Other   Review of Systems Review of Systems  All other systems reviewed and are negative.    Physical Exam Updated Vital Signs BP 118/69 (BP Location: Right Arm)   Pulse (!) 124   Temp 97.1 F (36.2 C) (Axillary)   Resp 18   Ht 5\' 4"  (1.626 m)   Wt 102 lb 4.7 oz (46.4 kg)   SpO2 100%    BMI 17.56 kg/m   Physical Exam  Constitutional: She is oriented to person, place, and time. She appears well-developed and well-nourished.  HENT:  Head: Normocephalic and atraumatic.  Cardiovascular: Regular rhythm.   No murmur heard. tachycardic  Pulmonary/Chest: Breath sounds normal. No respiratory distress.  tachypnea  Abdominal: Soft.  Mild to moderate diffuse abdominal tenderness  Musculoskeletal: She exhibits no edema or tenderness.  Neurological: She is alert and oriented to person, place, and time.  Skin: Skin is warm and dry.  Psychiatric: She has a normal mood and affect. Her behavior is normal.  Nursing note and vitals reviewed.    ED Treatments / Results  Labs (all labs ordered are listed, but only abnormal results are displayed) Labs Reviewed  BLOOD GAS, VENOUS - Abnormal; Notable for the following:       Result Value   pH, Ven 7.164 (*)    pCO2, Ven 24.0 (*)    pO2, Ven 50.0 (*)    Bicarbonate 8.3 (*)    Acid-base deficit 19.0 (*)    All other components within normal limits  CBC WITH DIFFERENTIAL/PLATELET - Abnormal; Notable for the following:    WBC 12.8 (*)    RBC 3.63 (*)    Hemoglobin 10.4 (*)    HCT 34.0 (*)    Platelets 466 (*)    Neutro Abs 10.6 (*)    Monocytes Absolute 1.2 (*)    All other components within normal limits  URINALYSIS, ROUTINE W REFLEX MICROSCOPIC (NOT AT [facility]) - Abnormal; Notable for the following:    APPearance CLOUDY (*)    Glucose, UA >1000 (*)    Ketones, ur >80 (*)    All other components within normal limits  COMPREHENSIVE METABOLIC PANEL - Abnormal; Notable for the following:    Sodium 122 (*)    Potassium 6.6 (*)    Chloride 82 (*)    CO2 8 (*)    Glucose, Bld 939 (*)    BUN 29 (*)    Creatinine, Ser 1.28 (*)    AST 177 (*)    ALT 84 (*)    Alkaline Phosphatase 178 (*)    Total Bilirubin 1.6 (*)    GFR calc non Af Amer 53 (*)    Anion gap 32 (*)    All other components within normal limits  URINE  MICROSCOPIC-ADD ON - Abnormal; Notable for the following:  Squamous Epithelial / LPF 0-5 (*)    All other components within normal limits  BASIC METABOLIC PANEL - Abnormal; Notable for the following:    Sodium 122 (*)    Potassium 5.8 (*)    Chloride 85 (*)    CO2 7 (*)    Glucose, Bld 919 (*)    BUN 27 (*)    Creatinine, Ser 1.26 (*)    GFR calc non Af Amer 55 (*)    Anion gap 30 (*)    All other components within normal limits  BASIC METABOLIC PANEL - Abnormal; Notable for the following:    Sodium 128 (*)    Chloride 98 (*)    CO2 11 (*)    Glucose, Bld 457 (*)    BUN 26 (*)    Creatinine, Ser 1.05 (*)    Calcium 8.6 (*)    Anion gap 19 (*)    All other components within normal limits  GLUCOSE, CAPILLARY - Abnormal; Notable for the following:    Glucose-Capillary >600 (*)    All other components within normal limits  GLUCOSE, CAPILLARY - Abnormal; Notable for the following:    Glucose-Capillary 594 (*)    All other components within normal limits  GLUCOSE, CAPILLARY - Abnormal; Notable for the following:    Glucose-Capillary 431 (*)    All other components within normal limits  GLUCOSE, CAPILLARY - Abnormal; Notable for the following:    Glucose-Capillary 343 (*)    All other components within normal limits  GLUCOSE, CAPILLARY - Abnormal; Notable for the following:    Glucose-Capillary 286 (*)    All other components within normal limits  CBG MONITORING, ED - Abnormal; Notable for the following:    Glucose-Capillary >600 (*)    All other components within normal limits  I-STAT CHEM 8, ED - Abnormal; Notable for the following:    Sodium 119 (*)    Potassium 6.5 (*)    Chloride 88 (*)    BUN 28 (*)    Glucose, Bld >700 (*)    All other components within normal limits  CBG MONITORING, ED - Abnormal; Notable for the following:    Glucose-Capillary >600 (*)    All other components within normal limits  MRSA PCR SCREENING  LIPASE, BLOOD  BASIC METABOLIC PANEL    BASIC METABOLIC PANEL  BASIC METABOLIC PANEL  POC URINE PREG, ED    EKG  EKG Interpretation  Date/Time:  Wednesday October 15 2015 18:35:03 EDT Ventricular Rate:  128 PR Interval:    QRS Duration: 113 QT Interval:  320 QTC Calculation: 467 R Axis:   89 Text Interpretation:  Sinus tachycardia Low voltage, precordial leads Probable anteroseptal infarct, old Confirmed by Hazle Coca [phone]) on 10/15/2015 6:38:26 PM       Radiology No results found.  Procedures Procedures (including critical care time) CRITICAL CARE Performed by: Quintella Reichert   Total critical care time: 35 minutes  Critical care time was exclusive of separately billable procedures and treating other patients.  Critical care was necessary to treat or prevent imminent or life-threatening deterioration.  Critical care was time spent personally by me on the following activities: development of treatment plan with patient and/or surrogate as well as nursing, discussions with consultants, evaluation of patient's response to treatment, examination of patient, obtaining history from patient or surrogate, ordering and performing treatments and interventions, ordering and review of laboratory studies, ordering and review of radiographic studies, pulse oximetry and re-evaluation of patient's condition.  Medications Ordered in ED Medications  sodium chloride 0.9 % bolus 1,000 mL (not administered)  0.9 %  sodium chloride infusion ( Intravenous Transfusing/Transfer 10/15/15 1952)  0.9 %  sodium chloride infusion ( Intravenous New Bag/Given 10/15/15 2208)  dextrose 5 %-0.45 % sodium chloride infusion (not administered)  insulin regular (NOVOLIN R,HUMULIN R) 250 Units in sodium chloride 0.9 % 250 mL (1 Units/mL) infusion (8.5 Units/hr Intravenous Rate/Dose Change 10/16/15 0018)  enoxaparin (LOVENOX) injection 30 mg (30 mg Subcutaneous Given 10/15/15 2149)  ondansetron (ZOFRAN) injection 4-8 mg (4 mg Intravenous Given 10/15/15  2056)  amitriptyline (ELAVIL) tablet 100 mg (100 mg Oral Given 10/15/15 2150)  gabapentin (NEURONTIN) capsule 300 mg (300 mg Oral Given 10/15/15 2151)  cyclobenzaprine (FLEXERIL) tablet 10 mg (not administered)  lipase/protease/amylase (CREON) capsule 216,000 Units (not administered)  diphenoxylate-atropine (LOMOTIL) 2.5-0.025 MG per tablet 1 tablet (not administered)  acidophilus (RISAQUAD) capsule 1 capsule (not administered)  pantoprazole (PROTONIX) EC tablet 80 mg (not administered)  cholestyramine (QUESTRAN) packet 4 g (4 g Oral Given 10/15/15 2152)  saccharomyces boulardii (FLORASTOR) capsule 250 mg (250 mg Oral Given 10/15/15 2151)  dicyclomine (BENTYL) capsule 10 mg (10 mg Oral Given 10/15/15 2153)  multivitamin with minerals tablet 1 tablet (1 tablet Oral Given 10/15/15 2206)  traZODone (DESYREL) tablet 50 mg (not administered)  promethazine (PHENERGAN) injection 12.5-25 mg (not administered)  calcium gluconate tablet 1,000 mg (not administered)  HYDROmorphone (DILAUDID) injection 3 mg (not administered)  sodium chloride 0.9 % bolus 1,000 mL (0 mLs Intravenous Stopped 10/15/15 1927)  fentaNYL (SUBLIMAZE) injection 50 mcg (50 mcg Intravenous Given 10/15/15 1829)  HYDROmorphone (DILAUDID) injection 3 mg (3 mg Intravenous Given 10/16/15 0019)     Initial Impression / Assessment and Plan / ED Course  I have reviewed the triage vital signs and the nursing notes.  Pertinent labs & imaging results that were available during my care of the patient were reviewed by me and considered in my medical decision making (see chart for details).  Clinical Course   Patient here for evaluation of hyperglycemia and vomiting. She is in acute DKA in the emergency department. BMP with hyponatremia, hyperkalemia, low bicarbonate, hyperglycemia. She is started on IV fluids, insulin, glucose stable at 30. Hospitalist consulted for admission for further treatment. Pt updated of findings of studies and  recommendation of admission for further treatment.     Final Clinical Impressions(s) / ED Diagnoses   Final diagnoses:  Diabetic ketoacidosis without coma associated with type 1 diabetes mellitus [facility])    New Prescriptions Current Discharge Medication List       Quintella Reichert, MD 10/16/15 [phone]

## 2015-10-15 NOTE — ED Notes (Signed)
EDP notified of not being able to get an [place] IV and that an order for IV Team was placed.

## 2015-10-15 NOTE — ED Triage Notes (Signed)
Patient here from home with complaints of hyperglycemia. Type 1 diabetic. Reports pod to insulin pump expired. States "feel like im in DKA".

## 2015-10-15 NOTE — H&P (Signed)
[facility] History and Physical  CAMESHIA CRESSMAN DGU:[id number] DOB: [birth year] DOA: 10/15/2015  Referring physician: Dr Ralene Bathe PCP: Arnoldo Morale, MD   Chief Complaint: High blood sugars  HPI: [name] is a 35 y.o. female with type 1DM who uses a special insulin pump. One of the pieces (pod) needs to be changed every 3 days and hers expired but she couldn't find the separate glucometer needed to run the pump, so she went w/o insulin for about 5-6 hours.  In ED BS is 939, serum CO2= 8, Na low and K 6.6.  Asked to see for DKA admission.     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  Not working now. Past hx etoh, none for some time. No tobacco or drugs.  Mother is local and involved in her care. [place] in [place] two sibs also local.    Denies recent fevers, chills, sweats. Has chronic diarrhea and chron abd pain.  Abd pain today a lot worse than usual.  Nauseated as well.    Admits: Jan '16 - DKA, abd pain, strep agalact UTI Feb '16 - DKA Apr '16 - N/V , gastroparesis, DKA, Cdif colitis, severe PCM Apr '16 - diarrhea, DM controlled, CT inflammatory changes sigmoid/ rectum, stool neg for Cdif, biopsies negative of colon, eGD ok, p=improved Jun '16 - recurrent abd pain w DKA, recent negative w/u for Cdif and celiac sprue.  Seen by GI prev and had inc'd fecal fat and fecal elastase c/w pancreatic exocrine insuff and malabsorption. Gi consulted and suggested bact overgrowth vs visceral neuropathy.  [facility]   Creon enzymes. Prior etoh abuse causing fatty liver also Nov '16 - DM gastroparesis/ N/V diarrhea, DMT1, malabsoprtion, dehydration Jan '17 - HONK, DM2, severe PCM, exocrine panc insuff, hyperK, uncont DM Mar '17 - HONK, T1DM, depressoin /anxiety,  N/V/D for days, hx Cdif in the past May '17 - DKA, N/V for 3 days   ROS  denies CP  no joint pain   no HA  no blurry  vision  no rash  no diarrhea  no nausea/ vomiting  no dysuria  no difficulty voiding  no change in urine color    Past Medical History  Past Medical History:  Diagnosis Date  . Diabetes mellitus without complication ([place]    TYPE I   Past Surgical History  Past Surgical History:  Procedure Laterality Date  . BACK SURGERY     age 35 . ESOPHAGOGASTRODUODENOSCOPY (EGD) WITH PROPOFOL N/A 06/27/2014   Procedure: ESOPHAGOGASTRODUODENOSCOPY (EGD) WITH PROPOFOL;  Surgeon: DMilus Banister MD;  Location: [facility];  Service: Endoscopy;  Laterality: N/A;  . FLEXIBLE SIGMOIDOSCOPY N/A 06/24/2014   Procedure: FLEXIBLE SIGMOIDOSCOPY;  Surgeon: DMilus Banister MD;  Location: [facility];  Service: Endoscopy;  Laterality: N/A;  . LAPAROSCOPY  02/2002, 06/2010   laparoscopy with lysis pelvic adhesions for pelvic pain  . ROOT CANAL  10/10/12  . TOOTH EXTRACTION     Family History  Family History  Problem Relation Age of Onset  . Diabetes Maternal Grandmother   . Cancer Maternal Grandmother     Leukemia  . Cancer Maternal Grandfather     Small cell lung cancer   Social History  reports that she quit smoking about 15 years ago. She has quit using smokeless tobacco. She reports that she does not drink  alcohol or use drugs. Allergies  Allergies  Allergen Reactions  . Morphine Swelling    Other Reaction: Pruritus; swelling of face/throat  . Scallops [Shellfish Allergy] Anaphylaxis  . Betamethasone Other (See Comments) and Swelling    Other Reaction: Facial swelling Other Reaction: Facial swelling  . Diphenhydramine Hcl Nausea Only    IV diphenhydramine.  Oral capsules ok.    . Haloperidol And Related Itching    Pt wanted to rip skin off  . Ketorolac Itching  . Morphine And Related Itching  . Toradol [Ketorolac Tromethamine]     Pt wanted to rip skin off  . Latex Rash  . Other Rash    Sunscreen and grass   Home medications Prior to Admission medications   Medication Sig Start  Date End Date Taking? Authorizing Provider  acetaminophen (TYLENOL) 500 MG tablet Take 1,000 mg by mouth every 6 (six) hours as needed for moderate pain.    Yes Historical Provider, MD  amitriptyline (ELAVIL) 50 MG tablet Take 2 tablets (100 mg total) by mouth at bedtime. For Restless leg 07/11/15  Yes Arnoldo Morale, MD  CALCIUM PO Take 2 tablets by mouth daily.   Yes Historical Provider, MD  cholestyramine Lucrezia Starch) 4 G packet Take 1 packet (4 g total) by mouth 2 (two) times daily. Patient taking differently: Take 4 g by mouth 4 (four) times daily.  09/19/14  Yes Jeffrey Hedges, PA-C  CREON 36000 UNITS CPEP capsule Take 4-6 capsules by mouth 6 (six) times daily. Takes 6 capsules with every meal and 4 capsules with every snack.  12/02/14  Yes Historical Provider, MD  cyanocobalamin 500 MCG tablet Take 2,000 mcg by mouth 2 (two) times daily as needed (mouth sores).    Yes Historical Provider, MD  cyclobenzaprine (FLEXERIL) 10 MG tablet Take 10 mg by mouth 2 (two) times daily as needed. Dental problems 06/19/15  Yes Historical Provider, MD  dicyclomine (BENTYL) 10 MG capsule Take 10 mg by mouth 4 (four) times daily -  before meals and at bedtime. Reported on 04/01/2015   Yes Historical Provider, MD  diphenoxylate-atropine (LOMOTIL) 2.5-0.025 MG per tablet Take 1 tablet by mouth 4 (four) times daily as needed for diarrhea or loose stools. 11/08/14  Yes Orpah Greek, MD  feeding supplement, GLUCERNA SHAKE, (GLUCERNA SHAKE) LIQD Take 237 mLs by mouth 3 (three) times daily between meals. Patient taking differently: Take 237 mLs by mouth 4 (four) times daily.  07/06/14  Yes Thurnell Lose, MD  gabapentin (NEURONTIN) 300 MG capsule Take 1 capsule (300 mg total) by mouth at bedtime. 07/11/15  Yes Arnoldo Morale, MD  glucagon (GLUCAGON EMERGENCY) 1 MG injection Inject 1 mg into the muscle once as needed (severe hypoglycemia). 08/14/14  Yes Arnoldo Morale, MD  hydrOXYzine (ATARAX/VISTARIL) 25 MG tablet Take 1  tablet (25 mg total) by mouth 3 (three) times daily as needed. Patient taking differently: Take 25 mg by mouth 3 (three) times daily as needed for anxiety or itching.  07/11/15  Yes Arnoldo Morale, MD  hyoscyamine (LEVSIN SL) 0.125 MG SL tablet Place 0.125 mg under the tongue every 4 (four) hours as needed for cramping or pain. 09/15/15  Yes Historical Provider, MD  insulin lispro (HUMALOG) 100 UNIT/ML injection Inject 16 Units into the skin daily. Basal rate is 10 units. Bolus is 1-2 units TID   Yes Historical Provider, MD  Multiple Vitamin (MULTIVITAMIN WITH MINERALS) TABS Take 2 tablets by mouth daily. Diabetic support vitamin   Yes Historical Provider,  MD  omeprazole (PRILOSEC) 40 MG capsule Take 40 mg by mouth daily as needed (indigestion).    Yes Historical Provider, MD  oxyCODONE-acetaminophen (PERCOCET) 7.5-325 MG tablet Take 1 tablet by mouth every 4 (four) hours as needed for pain. 09/17/15  Yes Historical Provider, MD  Probiotic Product (DIGESTIVE ADVANTAGE GUMMIES PO) Take 2 tablets by mouth 2 (two) times daily.   Yes Historical Provider, MD  promethazine (PHENERGAN) 25 MG suppository Place 1 suppository (25 mg total) rectally every 6 (six) hours as needed for nausea or vomiting. 07/11/15  Yes Arnoldo Morale, MD  promethazine (PHENERGAN) 25 MG tablet Take 1 tablet (25 mg total) by mouth every 6 (six) hours as needed for nausea or vomiting. 07/11/15  Yes Arnoldo Morale, MD  saccharomyces boulardii (FLORASTOR) 250 MG capsule Take 1 capsule (250 mg total) by mouth 2 (two) times daily. 07/06/14  Yes Thurnell Lose, MD  traZODone (DESYREL) 50 MG tablet Take 1 tablet (50 mg total) by mouth at bedtime as needed for sleep. Patient taking differently: Take 50 mg by mouth at bedtime.  07/11/15  Yes Arnoldo Morale, MD  acetaminophen-codeine (TYLENOL #3) 300-30 MG tablet Take 1 tablet by mouth every 12 (twelve) hours as needed for moderate pain. Patient not taking: Reported on 10/15/2015 04/30/15   Arnoldo Morale, MD   cefUROXime (CEFTIN) 250 MG tablet Take 1 tablet (250 mg total) by mouth 2 (two) times daily with a meal. Patient not taking: Reported on 07/11/2015 07/08/15   Donne Hazel, MD   Liver Function Tests  Recent Labs Lab 10/15/15 1738  AST 177*  ALT 84*  ALKPHOS 178*  BILITOT 1.6*  PROT 7.7  ALBUMIN 4.6    Recent Labs Lab 10/15/15 1738  LIPASE 11   CBC  Recent Labs Lab 10/15/15 1738 10/15/15 1749  WBC 12.8*  --   NEUTROABS 10.6*  --   HGB 10.4* 12.6  HCT 34.0* 37.0  MCV 93.7  --   PLT 466*  --    Basic Metabolic Panel  Recent Labs Lab 10/15/15 1738 10/15/15 1749  NA 122* 119*  K 6.6* 6.5*  CL 82* 88*  CO2 8*  --   GLUCOSE 939* >700*  BUN 29* 28*  CREATININE 1.28* 0.80  CALCIUM 10.0  --      Vitals:   10/15/15 1447 10/15/15 1630 10/15/15 1700 10/15/15 1830  BP: 107/62 (!) 100/54 (!) 102/49 117/60  Pulse: 118 118 (!) 123 (!) 127  Resp: 25 14 17 18   Temp: 98 F (36.7 C)     SpO2: 100% 100% 100% 98%   Exam: Gen thin WF no distress, multiple metal piercings on face/ ears No rash, cyanosis or gangrene Sclera anicteric, throat clear  No jvd or bruits Chest clear bilat RRR no MRG Abd firm, scaphoid, diffusely mild-mod tenderness, vol guarding, +BS GU defer  MS no joint effusions or deformity Ext no LE or UE edema / no wounds or ulcers Neuro is alert, Ox 3 , nf, tired   Na 122  K 5.8   CO2 7  BUN 27  Cr 1.26  eGFR 55  Glu  919  AG = 30  Ca 9.2 Alb 4.6  AST 177  ALO 84  Tbili 1.6  Alkphos 178 WBC 12k  Hb 10.4  plt 466 UA > cloudy, no bact/ 0-5 wbc, no rbc, 0-5 epis, ket > 80, glu > 1000  EKG (independ reviewed) > sinus tach 128 bpm, no acute changes   Assessment:  1.   DKA - ostensibly this is due to running out of pump supplies and going w/o insulin for 6 hours approx.  No fevers, or signs of infection on exam.  UA ok.   2.  Pseudohyponatremia 3.  Hyperkalemia - due to shifts  4.  Acute/ chron abd pain - hx chron pancreatitis 5.  Hx etoh abuse  - quit 6.  Hx panc exocrine insufficiency/ chronic diarrhea - on Creon, Bentyl, probiotics, Questran, lomotil 7.  Nausea/ vomiting 8.  Depression - cont elavil/ neurontin/ desyrel prn  Plan - admit, glucommander, IVF"s, IV pain meds, antiemetics, cont po meds as tol for chron diarreha/ depression     Sol Blazing [facility] Pager [phone]  Cell [phone]  If 7PM-7AM, please contact night-coverage [email or web address] Password [facility] 10/15/2015, 6:44 PM

## 2015-10-16 ENCOUNTER — Observation Stay (HOSPITAL_COMMUNITY): Payer: Medicaid Other

## 2015-10-16 DIAGNOSIS — E1065 Type 1 diabetes mellitus with hyperglycemia: Secondary | ICD-10-CM | POA: Diagnosis present

## 2015-10-16 DIAGNOSIS — Z888 Allergy status to other drugs, medicaments and biological substances status: Secondary | ICD-10-CM | POA: Diagnosis not present

## 2015-10-16 DIAGNOSIS — E1010 Type 1 diabetes mellitus with ketoacidosis without coma: Secondary | ICD-10-CM | POA: Diagnosis not present

## 2015-10-16 DIAGNOSIS — E108 Type 1 diabetes mellitus with unspecified complications: Secondary | ICD-10-CM | POA: Diagnosis not present

## 2015-10-16 DIAGNOSIS — Z91013 Allergy to seafood: Secondary | ICD-10-CM | POA: Diagnosis not present

## 2015-10-16 DIAGNOSIS — E871 Hypo-osmolality and hyponatremia: Secondary | ICD-10-CM | POA: Diagnosis present

## 2015-10-16 DIAGNOSIS — E875 Hyperkalemia: Secondary | ICD-10-CM | POA: Diagnosis present

## 2015-10-16 DIAGNOSIS — F329 Major depressive disorder, single episode, unspecified: Secondary | ICD-10-CM | POA: Diagnosis present

## 2015-10-16 DIAGNOSIS — Z794 Long term (current) use of insulin: Secondary | ICD-10-CM | POA: Diagnosis not present

## 2015-10-16 DIAGNOSIS — Z885 Allergy status to narcotic agent status: Secondary | ICD-10-CM | POA: Diagnosis not present

## 2015-10-16 DIAGNOSIS — R197 Diarrhea, unspecified: Secondary | ICD-10-CM | POA: Diagnosis not present

## 2015-10-16 DIAGNOSIS — R112 Nausea with vomiting, unspecified: Secondary | ICD-10-CM | POA: Diagnosis not present

## 2015-10-16 DIAGNOSIS — Z87891 Personal history of nicotine dependence: Secondary | ICD-10-CM | POA: Diagnosis not present

## 2015-10-16 DIAGNOSIS — K529 Noninfective gastroenteritis and colitis, unspecified: Secondary | ICD-10-CM | POA: Diagnosis present

## 2015-10-16 DIAGNOSIS — R000 Tachycardia, unspecified: Secondary | ICD-10-CM | POA: Diagnosis not present

## 2015-10-16 DIAGNOSIS — Z91040 Latex allergy status: Secondary | ICD-10-CM | POA: Diagnosis not present

## 2015-10-16 DIAGNOSIS — E869 Volume depletion, unspecified: Secondary | ICD-10-CM | POA: Diagnosis not present

## 2015-10-16 DIAGNOSIS — Z79899 Other long term (current) drug therapy: Secondary | ICD-10-CM | POA: Diagnosis not present

## 2015-10-16 LAB — BASIC METABOLIC PANEL
ANION GAP: 8 (ref 5–15)
ANION GAP: 5 (ref 5–15)
Anion gap: 5 (ref 5–15)
BUN: 18 mg/dL (ref 6–20)
BUN: 16 mg/dL (ref 6–20)
BUN: 15 mg/dL (ref 6–20)
CALCIUM: 8.6 mg/dL — AB (ref 8.9–10.3)
CALCIUM: 8.7 mg/dL — AB (ref 8.9–10.3)
CALCIUM: 8.7 mg/dL — AB (ref 8.9–10.3)
CHLORIDE: 107 mmol/L (ref 101–111)
CHLORIDE: 105 mmol/L (ref 101–111)
CO2: 18 mmol/L — ABNORMAL LOW (ref 22–32)
CO2: 20 mmol/L — AB (ref 22–32)
CO2: 21 mmol/L — AB (ref 22–32)
CREATININE: 0.69 mg/dL (ref 0.44–1.00)
Chloride: 106 mmol/L (ref 101–111)
Creatinine, Ser: 0.74 mg/dL (ref 0.44–1.00)
Creatinine, Ser: 0.7 mg/dL (ref 0.44–1.00)
GFR calc Af Amer: >60 mL/min (ref >60)
GFR calc non Af Amer: >60 mL/min (ref >60)
GFR calc non Af Amer: >60 mL/min (ref >60)
GLUCOSE: 154 mg/dL — AB (ref 65–99)
GLUCOSE: 109 mg/dL — AB (ref 65–99)
Glucose, Bld: 181 mg/dL — ABNORMAL HIGH (ref 65–99)
Potassium: 4 mmol/L (ref 3.5–5.1)
Potassium: 3.9 mmol/L (ref 3.5–5.1)
Potassium: 3.5 mmol/L (ref 3.5–5.1)
Sodium: 132 mmol/L — ABNORMAL LOW (ref 135–145)
Sodium: 132 mmol/L — ABNORMAL LOW (ref 135–145)
Sodium: 131 mmol/L — ABNORMAL LOW (ref 135–145)

## 2015-10-16 LAB — GLUCOSE, CAPILLARY
GLUCOSE-CAPILLARY: 189 mg/dL — AB (ref 65–99)
GLUCOSE-CAPILLARY: 131 mg/dL — AB (ref 65–99)
GLUCOSE-CAPILLARY: 129 mg/dL — AB (ref 65–99)
GLUCOSE-CAPILLARY: 145 mg/dL — AB (ref 65–99)
GLUCOSE-CAPILLARY: 223 mg/dL — AB (ref 65–99)
GLUCOSE-CAPILLARY: 231 mg/dL — AB (ref 65–99)
GLUCOSE-CAPILLARY: 303 mg/dL — AB (ref 65–99)
GLUCOSE-CAPILLARY: 161 mg/dL — AB (ref 65–99)
GLUCOSE-CAPILLARY: 150 mg/dL — AB (ref 65–99)
Glucose-Capillary: 112 mg/dL — ABNORMAL HIGH (ref 65–99)
Glucose-Capillary: 127 mg/dL — ABNORMAL HIGH (ref 65–99)
Glucose-Capillary: 200 mg/dL — ABNORMAL HIGH (ref 65–99)
Glucose-Capillary: 244 mg/dL — ABNORMAL HIGH (ref 65–99)
Glucose-Capillary: 285 mg/dL — ABNORMAL HIGH (ref 65–99)
Glucose-Capillary: 286 mg/dL — ABNORMAL HIGH (ref 65–99)
Glucose-Capillary: 343 mg/dL — ABNORMAL HIGH (ref 65–99)
Glucose-Capillary: 92 mg/dL (ref 65–99)
Glucose-Capillary: 92 mg/dL (ref 65–99)

## 2015-10-16 LAB — RAPID URINE DRUG SCREEN, HOSP PERFORMED
AMPHETAMINES: NOT DETECTED
BARBITURATES: NOT DETECTED
BENZODIAZEPINES: NOT DETECTED
Cocaine: NOT DETECTED
Opiates: POSITIVE — AB
Tetrahydrocannabinol: NOT DETECTED

## 2015-10-16 MED ORDER — INSULIN GLARGINE 100 UNIT/ML ~~LOC~~ SOLN
7.0000 [IU] | Freq: Two times a day (BID) | SUBCUTANEOUS | Status: DC
  Administered 2015-10-16 – 2015-10-17 (×2): 7 [IU] via SUBCUTANEOUS
  Filled 2015-10-16 (×2): qty 0.07

## 2015-10-16 MED ORDER — HYDROMORPHONE HCL 2 MG/ML IJ SOLN
3.0000 mg | INTRAMUSCULAR | Status: DC | PRN
  Administered 2015-10-16 (×2): 3 mg via INTRAVENOUS
  Filled 2015-10-16 (×2): qty 2

## 2015-10-16 MED ORDER — HYDROMORPHONE HCL 1 MG/ML IJ SOLN: 0.5000 mg | INTRAMUSCULAR | Status: DC | PRN

## 2015-10-16 MED ORDER — HYDROMORPHONE HCL 1 MG/ML IJ SOLN
1.0000 mg | INTRAMUSCULAR | Status: DC | PRN
  Administered 2015-10-16: 1 mg via INTRAVENOUS
  Filled 2015-10-16: qty 1

## 2015-10-16 MED ORDER — INSULIN ASPART 100 UNIT/ML ~~LOC~~ SOLN
0.0000 [IU] | Freq: Three times a day (TID) | SUBCUTANEOUS | Status: DC
  Administered 2015-10-17: 2 [IU] via SUBCUTANEOUS

## 2015-10-16 MED ORDER — GLUCERNA SHAKE PO LIQD
237.0000 mL | Freq: Every day | ORAL | Status: DC
  Administered 2015-10-16 – 2015-10-17 (×4): 237 mL via ORAL
  Filled 2015-10-16 (×8): qty 237

## 2015-10-16 MED ORDER — HYDROMORPHONE HCL 2 MG/ML IJ SOLN
3.0000 mg | Freq: Once | INTRAMUSCULAR | Status: AC
  Administered 2015-10-16: 3 mg via INTRAVENOUS
  Filled 2015-10-16: qty 2

## 2015-10-16 MED ORDER — INSULIN GLARGINE 100 UNIT/ML ~~LOC~~ SOLN: 7.0000 [IU] | Freq: Two times a day (BID) | SUBCUTANEOUS | Status: DC

## 2015-10-16 MED ORDER — OXYCODONE-ACETAMINOPHEN 7.5-325 MG PO TABS
1.0000 | ORAL_TABLET | ORAL | Status: DC | PRN
  Administered 2015-10-16 – 2015-10-17 (×4): 1 via ORAL
  Filled 2015-10-16 (×4): qty 1

## 2015-10-16 MED ORDER — HYDROMORPHONE HCL 2 MG/ML IJ SOLN
2.0000 mg | INTRAMUSCULAR | Status: DC | PRN
  Administered 2015-10-16 – 2015-10-17 (×3): 2 mg via INTRAVENOUS
  Filled 2015-10-16 (×3): qty 1

## 2015-10-16 NOTE — Progress Notes (Signed)
Inpatient Diabetes Program Recommendations  AACE/ADA: New Consensus Statement on Inpatient Glycemic Control (2015)  Target Ranges:  Prepandial:   less than 140 mg/dL      Peak postprandial:   less than 180 mg/dL (1-2 hours)      Critically ill patients:  140 - 180 mg/dL   Lab Results  Component Value Date   GLUCAP 244 (H) 10/16/2015   HGBA1C 13.2 (H) 03/12/2015    Review of Glycemic Control  Diabetes history: DM1 Outpatient Diabetes medications: OmniPod pump Current orders for Inpatient glycemic control: IV insulin per DKA order set  Inpatient Diabetes Program Recommendations:    When pt meets criteria to transition off drip, consider Lantus 7 units bid. Give Lantus 2 hours prior to discontinuation of drip. Novolog sensitive tidwc and hs + 3 units tidwc for meal coverage insulin. Need updated HgbA1C. Last one 13.2% on 03/12/2015. Will need prescription for glucose meter and supplies when pt is discharged. Needs appt with her MD at [facility] within 1 week of discharge.  Not ready for transition at this point. Spoke with pt at length regarding her diabetes control. Pt seemed somewhat confused and said, "My antifreeze" instead of "my insulin." States she lost her glucose meter that "talks" to her OmniPod. She does have [place] and Humalog at home in the refrigerator.   Will continue to follow. Spoke with RN.  Thank you. Lorenda Peck, RD, LDN, CDE Inpatient Diabetes Coordinator [phone]

## 2015-10-16 NOTE — Progress Notes (Signed)
Initial Nutrition Assessment  DOCUMENTATION CODES:   Non-severe (moderate) malnutrition in context of chronic illness, Underweight  INTERVENTION:  - Will order 6 cans Glucerna Shake/day, each supplement provides 220 kcal and 10 grams of protein - RD will continue to monitor for needs.  NUTRITION DIAGNOSIS:   Malnutrition related to chronic illness as evidenced by moderate depletions of muscle mass, moderate depletion of body fat.  GOAL:   Patient will meet greater than or equal to 90% of their needs  MONITOR:   PO intake, Supplement acceptance, Weight trends, Labs, I & O's  REASON FOR ASSESSMENT:   Malnutrition Screening Tool  ASSESSMENT:   35 y.o. female with type 1DM who uses a special insulin pump. One of the pieces (pod) needs to be changed every 3 days and hers expired but she couldn't find the separate glucometer needed to run the pump, so she went w/o insulin for about 5-6 hours.  Pt seen for MST and is known to this RD from previous admissions. BMI indicates underweight status. Diet advanced from NPO to Carb Modified around 8:15 this AM and pt had not yet ordered anything at time of RD visit. Pt states that she is still having some nausea and abdominal pain but that it is improved from yesterday and much improved from admission.   She states that she has been working closely with an outpatient RD to adjust her diet to help with glycemic control and weight gain. Pt reports that she has been consuming items such as whole milk, milkshakes, grits/oatmeal/cream of wheat, and drinking 6-8 cans of Glucerna/day. Pt uses a pump at home.   She reports low weight of 80 lbs and that she is now up to 102 lbs. Chart review indicates that pt gained 12 lbs over the past 3 months. Physical assessment shows mild to moderate muscle and fat wasting to upper body; lower body not assessed during this visit.   Pt concerned about a medication that a GI doctor had informed her to take but that is  not covered by Medicaid. Name of medication is Viberzi. Will talk with CM in rounds and contact MD if needed.   Continue to encourage PO intakes and RD will continue to monitor for additional needs.  Medications reviewed; 1 capsule Risaquad/day, 1000 mg oral Ca gluconate/day, 216000 units Creon TID, daily multivitamin with minerals, PRN IV Zofran, PRN IV Phenergan. Labs reviewed; CBGs: 112-343 mg/dL, Na: 132 mmol/L, Ca: 8.7 mg/dL.   Diet Order:  Diet Carb Modified Fluid consistency: Thin; Room service appropriate? Yes  Skin:  Reviewed, no issues  Last BM:  8/23  Height:   Ht Readings from Last 1 Encounters:  10/15/15 5\' 4"  (1.626 m)    Weight:   Wt Readings from Last 1 Encounters:  10/15/15 102 lb 4.7 oz (46.4 kg)    Ideal Body Weight:  54.54 kg  BMI:  Body mass index is 17.56 kg/m.  Estimated Nutritional Needs:   Kcal:  1625-1850 (35-40 kcal/kg)  Protein:  70-80 grams  Fluid:  1.8 L/day  EDUCATION NEEDS:   No education needs identified at this time    Jarome Matin, MS, RD, LDN Inpatient Clinical Dietitian Pager # [phone] After hours/weekend pager # [phone]

## 2015-10-16 NOTE — Progress Notes (Addendum)
PROGRESS NOTE    [name]  [id number] DOB: September 11, 1980 DOA: 10/15/2015 PCP: Arnoldo Morale, MD      Brief Narrative:  [name] is a 35 y.o. female with type 1DM who uses a special insulin pump. One of the pieces (pod) needs to be changed every 3 days and hers expired but she couldn't find the separate glucometer needed to run the pump, so she went w/o insulin for about 5-6 hours.  In ED BS is 939, serum CO2= 8, Na low and K 6.6.  Asked to see for DKA admission.    Denies recent fevers, chills, sweats. Has chronic diarrhea and chron abd pain.  Abd pain today a lot worse than usual.  Nauseated as well.     Assessment & Plan:   Principal Problem:   DKA (diabetic ketoacidoses) ([place]) Active Problems:   DM type 1 (diabetes mellitus, type 1) (HCC)   Nausea vomiting and diarrhea   Chronic diarrhea   Volume depletion   Sinus tachycardia (HCC)   DKA -  -due to running out of pump supplies and going w/o insulin for 6 hours approx.   -No fevers, or signs of infection on exam.  UA ok.   -gap closing -patient was getting 3mg  of dilaudid which was making her sleepy and confused- -- asking to eat so I will change back to her home regimen -on insulin gtt-- once CO2 >22 will transition to lantus BID with 2 hour overlap and SSI plus novolog  Hyperkalemia - resolved   Acute/ chron abd pain - hx chron pancreatitis -resume home pain meds  Hx etoh abuse - quit  Hx panc exocrine insufficiency/ chronic diarrhea - on Creon, Bentyl, probiotics, Questran, lomotil  Depression - cont elavil/ neurontin/ desyrel prn  Elevated liver enzymes -? Low BP -hepatitis panel -consider U/S if still elevated in AM  DVT prophylaxis:  Lovenox  Code Status: Full Code   Family Communication:   Disposition Plan:     Consultants:     Procedures:         Subjective: Sleepy but will awaken Feeling better and wants to eat  Objective: Vitals:   10/16/15 0600 10/16/15 0800  10/16/15 0900 10/16/15 1000  BP: (!) 86/60 102/67  104/73  Pulse: (!) 107 (!) 109 100 (!) 139  Resp: (!) 9 15 (!) 24 11  Temp:  98.4 F (36.9 C)    TempSrc:  Oral    SpO2: 99% 100% (!) 72% (!) 83%  Weight:      Height:        Intake/Output Summary (Last 24 hours) at 10/16/15 1159 Last data filed at 10/16/15 1100  Gross per 24 hour  Intake          1694.65 ml  Output                0 ml  Net          1694.65 ml   Filed Weights   10/15/15 1933 10/15/15 2016  Weight: 45.4 kg (100 lb) 46.4 kg (102 lb 4.7 oz)    Examination:  General exam: NAD Respiratory system: Clear to auscultation. Respiratory effort normal. Cardiovascular system: S1 & S2 heard, RRR. No JVD, murmurs, rubs, gallops or clicks. No pedal edema. Gastrointestinal system: Abdomen is nondistended, soft and min tender. No organomegaly or masses felt. Normal bowel sounds heard. Central nervous system: No focal neurological deficits.     Data Reviewed: I have personally reviewed following labs and  imaging studies  CBC:  Recent Labs Lab 10/15/15 1738 10/15/15 1749  WBC 12.8*  --   NEUTROABS 10.6*  --   HGB 10.4* 12.6  HCT 34.0* 37.0  MCV 93.7  --   PLT 466*  --    Basic Metabolic Panel:  Recent Labs Lab 10/15/15 1738 10/15/15 1749 10/15/15 1908 10/15/15 2243 10/16/15 0321 10/16/15 0722  NA 122* 119* 122* 128* 132* 132*  K 6.6* 6.5* 5.8* 4.4 4.0 3.9  CL 82* 88* 85* 98* 106 107  CO2 8*  --  7* 11* 18* 20*  GLUCOSE 939* >700* 919* 457* 154* 109*  BUN 29* 28* 27* 26* 18 16  CREATININE 1.28* 0.80 1.26* 1.05* 0.74 0.70  CALCIUM 10.0  --  9.2 8.6* 8.6* 8.7*   GFR: Estimated Creatinine Clearance: 71.9 mL/min (by C-G formula based on SCr of 0.8 mg/dL). Liver Function Tests:  Recent Labs Lab 10/15/15 1738  AST 177*  ALT 84*  ALKPHOS 178*  BILITOT 1.6*  PROT 7.7  ALBUMIN 4.6    Recent Labs Lab 10/15/15 1738  LIPASE 11   No results for input(s): AMMONIA in the last 168  hours. Coagulation Profile: No results for input(s): INR, PROTIME in the last 168 hours. Cardiac Enzymes: No results for input(s): CKTOTAL, CKMB, CKMBINDEX, TROPONINI in the last 168 hours. BNP (last 3 results) No results for input(s): PROBNP in the last 8760 hours. HbA1C: No results for input(s): HGBA1C in the last 72 hours. CBG:  Recent Labs Lab 10/16/15 0709 10/16/15 0813 10/16/15 0920 10/16/15 1026 10/16/15 1122  GLUCAP 112* 145* 223* 244* 231*   Lipid Profile: No results for input(s): CHOL, HDL, LDLCALC, TRIG, CHOLHDL, LDLDIRECT in the last 72 hours. Thyroid Function Tests: No results for input(s): TSH, T4TOTAL, FREET4, T3FREE, THYROIDAB in the last 72 hours. Anemia Panel: No results for input(s): VITAMINB12, FOLATE, FERRITIN, TIBC, IRON, RETICCTPCT in the last 72 hours. Urine analysis:    Component Value Date/Time   COLORURINE YELLOW 10/15/2015 1647   APPEARANCEUR CLOUDY (A) 10/15/2015 1647   LABSPEC 1.027 10/15/2015 1647   PHURINE 5.5 10/15/2015 1647   GLUCOSEU >1000 (A) 10/15/2015 1647   HGBUR NEGATIVE 10/15/2015 1647   BILIRUBINUR NEGATIVE 10/15/2015 1647   BILIRUBINUR neg 05/26/2015 1441   KETONESUR >80 (A) 10/15/2015 1647   PROTEINUR NEGATIVE 10/15/2015 1647   UROBILINOGEN 0.2 05/26/2015 1441   UROBILINOGEN 0.2 01/04/2015 1436   NITRITE NEGATIVE 10/15/2015 1647   LEUKOCYTESUR NEGATIVE 10/15/2015 1647     ) Recent Results (from the past 240 hour(s))  MRSA PCR Screening     Status: None   Collection Time: 10/15/15  8:17 PM  Result Value Ref Range Status   MRSA by PCR NEGATIVE NEGATIVE Final    Comment:        The GeneXpert MRSA Assay (FDA approved for NASAL specimens only), is one component of a comprehensive MRSA colonization surveillance program. It is not intended to diagnose MRSA infection nor to guide or monitor treatment for MRSA infections.       Anti-infectives    None       Radiology Studies: Dg Chest Port 1 View  Result  Date: 10/16/2015 CLINICAL DATA:  Weakness and hypoxia EXAM: PORTABLE CHEST 1 VIEW COMPARISON:  06/23/2015 FINDINGS: Cardiac shadow is within normal limits. The lungs are clear bilaterally. Bilateral nipple piercings are seen. Postsurgical changes about the neck are noted. No bony abnormality is seen. IMPRESSION: No active disease. Electronically Signed   By: Inez Catalina  M.D.   On: 10/16/2015 11:24        Scheduled Meds: . acidophilus  1 capsule Oral Daily  . amitriptyline  100 mg Oral QHS  . calcium gluconate  2 tablet Oral Daily  . cholestyramine  4 g Oral BID  . dicyclomine  10 mg Oral TID AC & HS  . enoxaparin (LOVENOX) injection  30 mg Subcutaneous Q24H  . feeding supplement (GLUCERNA SHAKE)  237 mL Oral 6 X Daily  . gabapentin  300 mg Oral QHS  . lipase/protease/amylase  6 capsule Oral TID WC  . multivitamin with minerals  1 tablet Oral Daily  . saccharomyces boulardii  250 mg Oral BID  . sodium chloride  1,000 mL Intravenous Once   Continuous Infusions: . sodium chloride 150 mL/hr at 10/16/15 0000  . dextrose 5 % and 0.45% NaCl 150 mL/hr at 10/16/15 1100  . insulin (NOVOLIN-R) infusion 3.7 Units/hr (10/16/15 1027)     LOS: 0 days    Time spent: 35 min    [place], DO [facility] Pager [phone]  If 7PM-7AM, please contact night-coverage [email or web address] Password [email or web address] 10/16/2015, 11:59 AM

## 2015-10-17 DIAGNOSIS — E108 Type 1 diabetes mellitus with unspecified complications: Secondary | ICD-10-CM

## 2015-10-17 DIAGNOSIS — E1010 Type 1 diabetes mellitus with ketoacidosis without coma: Principal | ICD-10-CM

## 2015-10-17 DIAGNOSIS — R112 Nausea with vomiting, unspecified: Secondary | ICD-10-CM

## 2015-10-17 DIAGNOSIS — R197 Diarrhea, unspecified: Secondary | ICD-10-CM

## 2015-10-17 LAB — COMPREHENSIVE METABOLIC PANEL
ALBUMIN: 3.4 g/dL — AB (ref 3.5–5.0)
ALK PHOS: 111 U/L (ref 38–126)
ALT: 48 U/L (ref 14–54)
AST: 67 U/L — ABNORMAL HIGH (ref 15–41)
Anion gap: 5 (ref 5–15)
BUN: 11 mg/dL (ref 6–20)
CALCIUM: 8.1 mg/dL — AB (ref 8.9–10.3)
CO2: 19 mmol/L — AB (ref 22–32)
CREATININE: 0.51 mg/dL (ref 0.44–1.00)
Chloride: 105 mmol/L (ref 101–111)
GFR calc Af Amer: >60 mL/min (ref >60)
GFR calc non Af Amer: >60 mL/min (ref >60)
GLUCOSE: 372 mg/dL — AB (ref 65–99)
Potassium: 4.8 mmol/L (ref 3.5–5.1)
SODIUM: 129 mmol/L — AB (ref 135–145)
Total Bilirubin: 0.5 mg/dL (ref 0.3–1.2)
Total Protein: 6.4 g/dL — ABNORMAL LOW (ref 6.5–8.1)

## 2015-10-17 LAB — CBC
HCT: 28.3 % — ABNORMAL LOW (ref 36.0–46.0)
HEMOGLOBIN: 9.5 g/dL — AB (ref 12.0–15.0)
MCH: 28.7 pg (ref 26.0–34.0)
MCHC: 33.6 g/dL (ref 30.0–36.0)
MCV: 85.5 fL (ref 78.0–100.0)
Platelets: 262 10*3/uL (ref 150–400)
RBC: 3.31 MIL/uL — AB (ref 3.87–5.11)
RDW: 15.1 % (ref 11.5–15.5)
WBC: 7.5 10*3/uL (ref 4.0–10.5)

## 2015-10-17 LAB — HEPATITIS PANEL, ACUTE
Hep A IgM: NEGATIVE
Hep B C IgM: NEGATIVE
Hepatitis B Surface Ag: NEGATIVE

## 2015-10-17 LAB — GLUCOSE, CAPILLARY
Glucose-Capillary: 380 mg/dL — ABNORMAL HIGH (ref 65–99)
Glucose-Capillary: 455 mg/dL — ABNORMAL HIGH (ref 65–99)
Glucose-Capillary: 415 mg/dL — ABNORMAL HIGH (ref 65–99)

## 2015-10-17 MED ORDER — SODIUM CHLORIDE 0.9 % IV BOLUS (SEPSIS)
500.0000 mL | Freq: Once | INTRAVENOUS | Status: AC
Start: 2015-10-17 — End: 2015-10-17
  Administered 2015-10-17: 500 mL via INTRAVENOUS

## 2015-10-17 MED ORDER — SODIUM CHLORIDE 0.9 % IV BOLUS (SEPSIS): 500.0000 mL | Freq: Once | INTRAVENOUS | Status: DC

## 2015-10-17 NOTE — Progress Notes (Addendum)
Inpatient Diabetes Program Recommendations  AACE/ADA: New Consensus Statement on Inpatient Glycemic Control (2015)  Target Ranges:  Prepandial:   less than 140 mg/dL      Peak postprandial:   less than 180 mg/dL (1-2 hours)      Critically ill patients:  140 - 180 mg/dL   Results for LAKISA, YARNELL (MRN [id number]) as of 10/17/2015 09:37  Ref. Range 10/17/2015 07:34 10/17/2015 09:27  Glucose-Capillary Latest Ref Range: 65 - 99 mg/dL 380 (H) 455 (H)    Admit with: DKA  History: Type 1 DM  Home DM Meds: Insulin Pump (Endocrinologist Dr. Graceann Congress with [facility])  Current Insulin Orders: Lantus 7 units bid      Novolog Sensitive Correction Scale/ SSI (0-9 units) TID AC       -Patient transitioned off IV Insulin drip yesterday.  Given 7 units Lantus at 5:30pm and IV Insulin drip turned off at 7:40pm.  -CBG up to 380 mg/dl this AM.  This may be due to the fact that patient only received a portion of her total basal insulin that she takes at home.  Due for next dose of Lantus this AM.  -Through Care Everywhere, note that patient saw her Endocrinologist, Dr. Graceann Congress with [facility], on 09/15/15.  At that visit, Insulin pump was adjusted to the following: Total basal insulin per day 12.6 units, Carbohydrate Ratio changed to 12am 1:40, 1pm 1:25, 4pm 1:35, and Correction Factor changed to 12am 1:100, 9am 1:85, 10pm 1:100.  Target CBGs are 12am 160-180 mg/dl, 6am 130-160 mg/dl, and 10pm 160-180 mg/dl.  -Also per Care Everywhere notes, patient's Off insulin pump plan is: [place] insulin 10 units daily + Humalog for correction and meal coverage per her pump settings.  -Note DM Coordinator spoke with patient yesterday.  Patient well known to the Inpatient Glycemic Control Team (frequent admissions).       MD- Please consider starting Novolog Meal Coverage for this patient:  Novolog 3 units tid with meals (hold if pt eats <50% of meal)   Addendum  11:20am: Went to speak with pt about her DM care regimen at home.  Patient had already left AMA by the time I arrived on the unit.      --Will follow patient during hospitalization--  Wyn Quaker RN, MSN, CDE Diabetes Coordinator Inpatient Glycemic Control Team Team Pager: [phone] (8a-5p)

## 2015-10-17 NOTE — Discharge Summary (Signed)
Physician Discharge Summary  MILIANY LINKE [id number] DOB: 1980-07-02 DOA: 10/15/2015  PCP: Arnoldo Morale, MD  Admit date: 10/15/2015 Discharge date: 10/17/2015  Time spent: >35 minutes  Recommendations for Outpatient Follow-up:  Left AMA  Discharge Diagnoses:  Principal Problem:   DKA (diabetic ketoacidoses) ([place]) Active Problems:   DM type 1 (diabetes mellitus, type 1) (H[facility])   Nausea vomiting and diarrhea   Chronic diarrhea   Volume depletion   Sinus tachycardia ([place])   Discharge Condition: AMA  Diet recommendation: DM  Filed Weights   10/15/15 1933 10/15/15 2016  Weight: 45.4 kg (100 lb) 46.4 kg (102 lb 4.7 oz)    History of present illness:  Mandy A Colemanis a 35 y.o.femalewith type 1DM who uses a special insulin pump. One of the pieces (pod) needs to be changed every 3 days and hers expired but she couldn't find the separate glucometer needed to run the pump, so she went w/o insulin for about 5-6 hours. In ED BS is 939, serum CO2= 8, Na low and K 6.6. Asked to see for DKA admission.  Denies recent fevers, chills, sweats. Has chronic diarrhea and chron abd pain. Abd pain today a lot worse than usual. Nauseated as well.   Hospital Course:  DKA --due to running out of pump supplies and going w/o insulin for 6 hours approx. No fevers, or signs of infection on exam. UA ok.  -gap closing with iv insulin, transitioned to sq insulin regimen, but patient refused indicated insulin sliding scale despite her glucose being around 455. She decided to go home. AMA  Hyperkalemia - resolved  Acute/ chron abd pain - hx chron pancreatitis Hx panc exocrine insufficiency/ chronic diarrhea - on Creon, Bentyl, probiotics, Questran, lomotil  Chronic pain. suspected opioid addiction. She is requesting dilaudid with phenergan iv, although no distress, comfortable.   -D/w patient at length. She preferred to go home today against medical advise   Procedures:  none (i.e.  Studies not automatically included, echos, thoracentesis, etc; not x-rays)  Consultations:  none  Discharge Exam: Vitals:   10/17/15 0600 10/17/15 0800  BP: 94/61 121/78  Pulse: 100   Resp: (!) 8 17  Temp:  98.1 F (36.7 C)    General: alert, no distress  Cardiovascular: s1,s2 rrr Respiratory: CTA BL  Discharge Instructions     Medication List    ASK your doctor about these medications   acetaminophen 500 MG tablet Commonly known as:  TYLENOL Take 1,000 mg by mouth every 6 (six) hours as needed for moderate pain.   acetaminophen-codeine 300-30 MG tablet Commonly known as:  TYLENOL #3 Take 1 tablet by mouth every 12 (twelve) hours as needed for moderate pain.   amitriptyline 50 MG tablet Commonly known as:  ELAVIL Take 2 tablets (100 mg total) by mouth at bedtime. For Restless leg   CALCIUM PO Take 2 tablets by mouth daily.   cefUROXime 250 MG tablet Commonly known as:  CEFTIN Take 1 tablet (250 mg total) by mouth 2 (two) times daily with a meal.   cholestyramine 4 g packet Commonly known as:  QUESTRAN Take 1 packet (4 g total) by mouth 2 (two) times daily.   CREON 36000 UNITS Cpep capsule Generic drug:  lipase/protease/amylase Take 4-6 capsules by mouth 6 (six) times daily. Takes 6 capsules with every meal and 4 capsules with every snack.   cyanocobalamin 500 MCG tablet Take 2,000 mcg by mouth 2 (two) times daily as needed (mouth sores).   cyclobenzaprine  10 MG tablet Commonly known as:  FLEXERIL Take 10 mg by mouth 2 (two) times daily as needed. Dental problems   dicyclomine 10 MG capsule Commonly known as:  BENTYL Take 10 mg by mouth 4 (four) times daily -  before meals and at bedtime. Reported on 04/01/2015   DIGESTIVE ADVANTAGE GUMMIES PO Take 2 tablets by mouth 2 (two) times daily.   diphenoxylate-atropine 2.5-0.025 MG tablet Commonly known as:  LOMOTIL Take 1 tablet by mouth 4 (four) times daily as needed for diarrhea or loose stools.    feeding supplement (GLUCERNA SHAKE) Liqd Take 237 mLs by mouth 3 (three) times daily between meals.   gabapentin 300 MG capsule Commonly known as:  NEURONTIN Take 1 capsule (300 mg total) by mouth at bedtime.   glucagon 1 MG injection Commonly known as:  GLUCAGON EMERGENCY Inject 1 mg into the muscle once as needed (severe hypoglycemia).   hydrOXYzine 25 MG tablet Commonly known as:  ATARAX/VISTARIL Take 1 tablet (25 mg total) by mouth 3 (three) times daily as needed.   hyoscyamine 0.125 MG SL tablet Commonly known as:  LEVSIN SL Place 0.125 mg under the tongue every 4 (four) hours as needed for cramping or pain.   insulin lispro 100 UNIT/ML injection Commonly known as:  HUMALOG Inject 16 Units into the skin daily. Basal rate is 10 units. Bolus is 1-2 units TID   multivitamin with minerals Tabs tablet Take 2 tablets by mouth daily. Diabetic support vitamin   omeprazole 40 MG capsule Commonly known as:  PRILOSEC Take 40 mg by mouth daily as needed (indigestion).   oxyCODONE-acetaminophen 7.5-325 MG tablet Commonly known as:  PERCOCET Take 1 tablet by mouth every 4 (four) hours as needed for pain.   promethazine 25 MG tablet Commonly known as:  PHENERGAN Take 1 tablet (25 mg total) by mouth every 6 (six) hours as needed for nausea or vomiting.   promethazine 25 MG suppository Commonly known as:  PHENERGAN Place 1 suppository (25 mg total) rectally every 6 (six) hours as needed for nausea or vomiting.   saccharomyces boulardii 250 MG capsule Commonly known as:  FLORASTOR Take 1 capsule (250 mg total) by mouth 2 (two) times daily.   traZODone 50 MG tablet Commonly known as:  DESYREL Take 1 tablet (50 mg total) by mouth at bedtime as needed for sleep.      Allergies  Allergen Reactions  . Morphine Swelling    Other Reaction: Pruritus; swelling of face/throat  . Scallops [Shellfish Allergy] Anaphylaxis  . Betamethasone Other (See Comments) and Swelling    Other  Reaction: Facial swelling Other Reaction: Facial swelling  . Diphenhydramine Hcl Nausea Only    IV diphenhydramine.  Oral capsules ok.    . Haloperidol And Related Itching    Pt wanted to rip skin off  . Ketorolac Itching  . Morphine And Related Itching  . Toradol [Ketorolac Tromethamine]     Pt wanted to rip skin off  . Latex Rash  . Other Rash    Sunscreen and grass      The results of significant diagnostics from this hospitalization (including imaging, microbiology, ancillary and laboratory) are listed below for reference.    Significant Diagnostic Studies: Dg Chest Port 1 View  Result Date: 10/16/2015 CLINICAL DATA:  Weakness and hypoxia EXAM: PORTABLE CHEST 1 VIEW COMPARISON:  06/23/2015 FINDINGS: Cardiac shadow is within normal limits. The lungs are clear bilaterally. Bilateral nipple piercings are seen. Postsurgical changes about the neck are  noted. No bony abnormality is seen. IMPRESSION: No active disease. Electronically Signed   By: Inez Catalina M.D.   On: 10/16/2015 11:24    Microbiology: Recent Results (from the past 240 hour(s))  MRSA PCR Screening     Status: None   Collection Time: 10/15/15  8:17 PM  Result Value Ref Range Status   MRSA by PCR NEGATIVE NEGATIVE Final    Comment:        The GeneXpert MRSA Assay (FDA approved for NASAL specimens only), is one component of a comprehensive MRSA colonization surveillance program. It is not intended to diagnose MRSA infection nor to guide or monitor treatment for MRSA infections.      Labs: Basic Metabolic Panel:  Recent Labs Lab 10/15/15 2243 10/16/15 0321 10/16/15 0722 10/16/15 1215 10/17/15 0311  NA 128* 132* 132* 131* 129*  K 4.4 4.0 3.9 3.5 4.8  CL 98* 106 107 105 105  CO2 11* 18* 20* 21* 19*  GLUCOSE 457* 154* 109* 181* 372*  BUN 26* 18 16 15 11   CREATININE 1.05* 0.74 0.70 0.69 0.51  CALCIUM 8.6* 8.6* 8.7* 8.7* 8.1*   Liver Function Tests:  Recent Labs Lab 10/15/15 1738 10/17/15 0311   AST 177* 67*  ALT 84* 48  ALKPHOS 178* 111  BILITOT 1.6* 0.5  PROT 7.7 6.4*  ALBUMIN 4.6 3.4*    Recent Labs Lab 10/15/15 1738  LIPASE 11   No results for input(s): AMMONIA in the last 168 hours. CBC:  Recent Labs Lab 10/15/15 1738 10/15/15 1749 10/17/15 0311  WBC 12.8*  --  7.5  NEUTROABS 10.6*  --   --   HGB 10.4* 12.6 9.5*  HCT 34.0* 37.0 28.3*  MCV 93.7  --  85.5  PLT 466*  --  262   Cardiac Enzymes: No results for input(s): CKTOTAL, CKMB, CKMBINDEX, TROPONINI in the last 168 hours. BNP: BNP (last 3 results) No results for input(s): BNP in the last 8760 hours.  ProBNP (last 3 results) No results for input(s): PROBNP in the last 8760 hours.  CBG:  Recent Labs Lab 10/16/15 1940 10/16/15 2157 10/17/15 0734 10/17/15 0927 10/17/15 1022  GLUCAP 92 150* 380* 455* 415*       Signed:  Manvi Guilliams N  [facility] 10/17/2015, 3:36 PM

## 2015-10-17 NOTE — Progress Notes (Addendum)
Pt was told she was going to be discharged, at that time she made arrangements to pick up her daughter herself.  Pt was then made aware that she was unable to be discharged by the MD due to her blood sugar being too high.  She accepted a partial dose of the ordered Novolog.  She stated that if she accepted the full dose then her blood sugar would get too low. Her blood sugar did not decrease enough for the MD to discharge her.    Pt was then told that she would have to leave AMA, if she did not want to stay admitted.  Pt stated that she had no other choice due to her canceling her childcare arrangements.    Pt requested that her blood sugar be checked again.  CBG went from 455 to 415; she was educated that it still was not low enough for the MD to discharge.   She stated she had to leave because she had to go pick up her daughter.  She stated she had insulin at home to control her sugars. She also stated she was going to stop by [facility] to buy a new glucometer before going home.  Diabetes Coordinator was paged and stated she would be on the unit in 20 minutes; however, pt was not able to wait for the diabetes coordinator to consult.

## 2015-10-30 ENCOUNTER — Other Ambulatory Visit: Payer: Self-pay | Admitting: Family Medicine

## 2015-10-30 DIAGNOSIS — K3184 Gastroparesis: Principal | ICD-10-CM

## 2015-10-30 DIAGNOSIS — E1043 Type 1 diabetes mellitus with diabetic autonomic (poly)neuropathy: Secondary | ICD-10-CM

## 2015-11-02 ENCOUNTER — Encounter (HOSPITAL_COMMUNITY): Payer: Self-pay

## 2015-11-02 ENCOUNTER — Emergency Department (HOSPITAL_COMMUNITY)
Admission: EM | Admit: 2015-11-02 | Discharge: 2015-11-03 | Disposition: A | Discharge status: Home / Self Care | Payer: Medicaid Other | Attending: Emergency Medicine | Admitting: Emergency Medicine

## 2015-11-02 ENCOUNTER — Emergency Department (HOSPITAL_COMMUNITY): Payer: Medicaid Other

## 2015-11-02 DIAGNOSIS — R739 Hyperglycemia, unspecified: Secondary | ICD-10-CM

## 2015-11-02 DIAGNOSIS — R945 Abnormal results of liver function studies: Secondary | ICD-10-CM

## 2015-11-02 DIAGNOSIS — R748 Abnormal levels of other serum enzymes: Secondary | ICD-10-CM | POA: Diagnosis not present

## 2015-11-02 DIAGNOSIS — K0889 Other specified disorders of teeth and supporting structures: Secondary | ICD-10-CM | POA: Insufficient documentation

## 2015-11-02 DIAGNOSIS — E1065 Type 1 diabetes mellitus with hyperglycemia: Secondary | ICD-10-CM | POA: Insufficient documentation

## 2015-11-02 DIAGNOSIS — R7401 Elevation of levels of liver transaminase levels: Secondary | ICD-10-CM

## 2015-11-02 DIAGNOSIS — Z794 Long term (current) use of insulin: Secondary | ICD-10-CM | POA: Insufficient documentation

## 2015-11-02 DIAGNOSIS — Z79899 Other long term (current) drug therapy: Secondary | ICD-10-CM | POA: Insufficient documentation

## 2015-11-02 DIAGNOSIS — R1084 Generalized abdominal pain: Secondary | ICD-10-CM | POA: Diagnosis not present

## 2015-11-02 DIAGNOSIS — R7989 Other specified abnormal findings of blood chemistry: Secondary | ICD-10-CM | POA: Diagnosis not present

## 2015-11-02 DIAGNOSIS — Z87891 Personal history of nicotine dependence: Secondary | ICD-10-CM | POA: Insufficient documentation

## 2015-11-02 DIAGNOSIS — R740 Nonspecific elevation of levels of transaminase and lactic acid dehydrogenase [LDH]: Secondary | ICD-10-CM | POA: Insufficient documentation

## 2015-11-02 LAB — COMPREHENSIVE METABOLIC PANEL
ALT: 919 U/L — ABNORMAL HIGH (ref 14–54)
ANION GAP: 13 (ref 5–15)
AST: 2398 U/L — ABNORMAL HIGH (ref 15–41)
Albumin: 4.7 g/dL (ref 3.5–5.0)
Alkaline Phosphatase: 302 U/L — ABNORMAL HIGH (ref 38–126)
BUN: 22 mg/dL — ABNORMAL HIGH (ref 6–20)
CHLORIDE: 88 mmol/L — AB (ref 101–111)
CO2: 25 mmol/L (ref 22–32)
Calcium: 9.2 mg/dL (ref 8.9–10.3)
Creatinine, Ser: 0.85 mg/dL (ref 0.44–1.00)
Glucose, Bld: 700 mg/dL (ref 65–99)
POTASSIUM: 4.2 mmol/L (ref 3.5–5.1)
SODIUM: 126 mmol/L — AB (ref 135–145)
Total Bilirubin: 2.6 mg/dL — ABNORMAL HIGH (ref 0.3–1.2)
Total Protein: 8.6 g/dL — ABNORMAL HIGH (ref 6.5–8.1)

## 2015-11-02 LAB — BLOOD GAS, VENOUS
Acid-Base Excess: 2 mmol/L (ref 0.0–2.0)
Bicarbonate: 26.3 mmol/L (ref 20.0–28.0)
Drawn by: 295031
FIO2: 21
O2 SAT: 44.4 %
PATIENT TEMPERATURE: 98.6
pCO2, Ven: 42.2 mmHg — ABNORMAL LOW (ref 44.0–60.0)
pH, Ven: 7.412 (ref 7.250–7.430)

## 2015-11-02 LAB — URINE MICROSCOPIC-ADD ON: RBC / HPF: NONE SEEN RBC/hpf (ref 0–5)

## 2015-11-02 LAB — CBG MONITORING, ED
GLUCOSE-CAPILLARY: 399 mg/dL — AB (ref 65–99)
GLUCOSE-CAPILLARY: 227 mg/dL — AB (ref 65–99)
GLUCOSE-CAPILLARY: 125 mg/dL — AB (ref 65–99)
Glucose-Capillary: >600 mg/dL (ref 65–99)
Glucose-Capillary: 264 mg/dL — ABNORMAL HIGH (ref 65–99)

## 2015-11-02 LAB — URINALYSIS, ROUTINE W REFLEX MICROSCOPIC
Bilirubin Urine: NEGATIVE
Hgb urine dipstick: NEGATIVE
Ketones, ur: NEGATIVE mg/dL
NITRITE: NEGATIVE
PROTEIN: NEGATIVE mg/dL
Specific Gravity, Urine: 1.029 (ref 1.005–1.030)
pH: 6.5 (ref 5.0–8.0)

## 2015-11-02 LAB — LIPASE, BLOOD: LIPASE: 17 U/L (ref 11–51)

## 2015-11-02 LAB — CBC
HCT: 38.3 % (ref 36.0–46.0)
Hemoglobin: 12.5 g/dL (ref 12.0–15.0)
MCH: 28.3 pg (ref 26.0–34.0)
MCHC: 32.6 g/dL (ref 30.0–36.0)
MCV: 86.7 fL (ref 78.0–100.0)
PLATELETS: 188 10*3/uL (ref 150–400)
RBC: 4.42 MIL/uL (ref 3.87–5.11)
RDW: 16 % — ABNORMAL HIGH (ref 11.5–15.5)
WBC: 4.7 10*3/uL (ref 4.0–10.5)

## 2015-11-02 LAB — POC URINE PREG, ED: PREG TEST UR: NEGATIVE

## 2015-11-02 MED ORDER — HYDROMORPHONE HCL 1 MG/ML IJ SOLN
1.0000 mg | Freq: Once | INTRAMUSCULAR | Status: AC
  Administered 2015-11-02: 1 mg via INTRAVENOUS
  Filled 2015-11-02: qty 1

## 2015-11-02 MED ORDER — SODIUM CHLORIDE 0.9 % IV BOLUS (SEPSIS)
1000.0000 mL | Freq: Once | INTRAVENOUS | Status: AC
  Administered 2015-11-02: 1000 mL via INTRAVENOUS

## 2015-11-02 MED ORDER — ONDANSETRON 4 MG PO TBDP: 4.0000 mg | ORAL_TABLET | Freq: Three times a day (TID) | ORAL | 0 refills | Status: DC | PRN

## 2015-11-02 MED ORDER — LORAZEPAM 2 MG/ML IJ SOLN
1.0000 mg | Freq: Once | INTRAMUSCULAR | Status: AC
  Administered 2015-11-02: 1 mg via INTRAVENOUS
  Filled 2015-11-02: qty 1

## 2015-11-02 MED ORDER — SODIUM CHLORIDE 0.9 % IV SOLN
INTRAVENOUS | Status: DC
  Administered 2015-11-02: 5.4 [IU]/h via INTRAVENOUS
  Filled 2015-11-02: qty 2.5

## 2015-11-02 MED ORDER — ONDANSETRON HCL 4 MG/2ML IJ SOLN
4.0000 mg | Freq: Once | INTRAMUSCULAR | Status: AC
  Administered 2015-11-02: 4 mg via INTRAVENOUS
  Filled 2015-11-02: qty 2

## 2015-11-02 MED ORDER — PENICILLIN V POTASSIUM 500 MG PO TABS: 500.0000 mg | ORAL_TABLET | Freq: Four times a day (QID) | ORAL | 0 refills | Status: AC

## 2015-11-02 MED ORDER — FENTANYL CITRATE (PF) 100 MCG/2ML IJ SOLN
50.0000 ug | Freq: Once | INTRAMUSCULAR | Status: AC
  Administered 2015-11-02: 50 ug via INTRAVENOUS
  Filled 2015-11-02: qty 2

## 2015-11-02 NOTE — ED Provider Notes (Signed)
[facility] DEPT Provider Note   CSN: [id number] Arrival date & time: 11/02/15  1655     History   Chief Complaint Chief Complaint  Patient presents with  . Hyperglycemia  . Abdominal Pain  . Emesis  . Diarrhea    HPI [name] is a 35 y.o. female.  HPI [name] is a 35 y.o. female with PMH significant for DMT1 with hx of DKA who presents with 2 days of generalized abdominal pain with N/V/D.  States her symptoms feel similar to prior DKA episodes, she states "I feel like I'm about to go into DKA".  She wears an Omni pod insulin pump and changes it every 3 days, today is the 3rd day.  She took a ketone test at home and it read high.  She states she checked her sugar around 2 PM at it was 250.  She states she has left lower tooth pain and thinks it's infected.  No fever, tongue swelling, or drainage from the area.  No modifying or aggravating factors.   Per chart review, patient was admitted 10/15/15 for DKA.   Past Medical History:  Diagnosis Date  . Diabetes mellitus without complication ([place])    TYPE I    Patient Active Problem List   Diagnosis Date Noted  . Chronic diarrhea 10/15/2015  . Volume depletion 10/15/2015  . Sinus tachycardia ([place]) 10/15/2015  . Restless leg syndrome 07/11/2015  . Diabetic neuropathy ([place]) 07/11/2015  . DKA (diabetic ketoacidoses) ([place]) 07/05/2015  . Anxiety state 05/26/2015  . Hyperglycemia due to type 1 diabetes mellitus ([place]) 05/03/2015  . Nausea vomiting and diarrhea 05/03/2015  . Insomnia 04/01/2015  . Malnutrition of moderate degree 03/12/2015  . DM type 1 (diabetes mellitus, type 1) ([place]) 03/11/2015  . Hyperglycemic hyperosmolar nonketotic coma ([place]) 03/11/2015  . Diarrhea due to malabsorption 03/11/2015  . Hyperglycemia 03/11/2015  . Hyperkalemia 01/11/2015  . Exocrine pancreatic insufficiency ([place]) 07/30/2014  . Adjustment disorder with mixed anxiety and depressed mood 07/27/2014  . Personal history of  noncompliance with medical treatment 06/22/2014  . Protein-calorie malnutrition, severe ([place]) 05/31/2014  . Diabetic gastroparesis associated with type 1 diabetes mellitus ([place]) 03/03/2014    Past Surgical History:  Procedure Laterality Date  . BACK SURGERY     age 68  . ESOPHAGOGASTRODUODENOSCOPY (EGD) WITH PROPOFOL N/A 06/27/2014   Procedure: ESOPHAGOGASTRODUODENOSCOPY (EGD) WITH PROPOFOL;  Surgeon: Milus Banister, MD;  Location: [facility] ENDOSCOPY;  Service: Endoscopy;  Laterality: N/A;  . FLEXIBLE SIGMOIDOSCOPY N/A 06/24/2014   Procedure: FLEXIBLE SIGMOIDOSCOPY;  Surgeon: Milus Banister, MD;  Location: [facility] ENDOSCOPY;  Service: Endoscopy;  Laterality: N/A;  . LAPAROSCOPY  02/2002, 06/2010   laparoscopy with lysis pelvic adhesions for pelvic pain  . ROOT CANAL  10/10/12  . TOOTH EXTRACTION      OB History    No data available       Home Medications    Prior to Admission medications   Medication Sig Start Date End Date Taking? Authorizing Provider  acetaminophen (TYLENOL) 500 MG tablet Take 500-1,000 mg by mouth every 6 (six) hours as needed for mild pain, moderate pain or headache.    Yes Historical Provider, MD  ALPRAZolam Duanne Moron) 1 MG tablet Take 2 mg by mouth at bedtime.   Yes Historical Provider, MD  amitriptyline (ELAVIL) 50 MG tablet Take 100 mg by mouth at bedtime.   Yes Historical Provider, MD  calcium carbonate (OSCAL) 1500 (600 Ca) MG TABS tablet Take 1,200 mg of  elemental calcium by mouth daily with breakfast.   Yes Historical Provider, MD  cholestyramine (QUESTRAN) 4 g packet Take 4 g by mouth 4 (four) times daily.   Yes Historical Provider, MD  CREON 36000 UNITS CPEP capsule Take 144,000-216,000 Units by mouth 5 (five) times daily. Pt takes six capsules three times daily with meals and four capsules two times daily with snacks.   Yes Historical Provider, MD  cyclobenzaprine (FLEXERIL) 10 MG tablet Take 10 mg by mouth 3 (three) times daily as needed for muscle spasms.    Yes  Historical Provider, MD  dicyclomine (BENTYL) 10 MG capsule Take 10 mg by mouth 4 (four) times daily -  before meals and at bedtime.    Yes Historical Provider, MD  diphenoxylate-atropine (LOMOTIL) 2.5-0.025 MG per tablet Take 1 tablet by mouth 4 (four) times daily as needed for diarrhea or loose stools. 11/08/14  Yes Orpah Greek, MD  feeding supplement, GLUCERNA SHAKE, (GLUCERNA SHAKE) LIQD Take 237 mLs by mouth 6 (six) times daily.   Yes Historical Provider, MD  gabapentin (NEURONTIN) 300 MG capsule Take 300 mg by mouth 3 (three) times daily.   Yes Historical Provider, MD  glucagon (GLUCAGON EMERGENCY) 1 MG injection Inject 1 mg into the vein once as needed (for severe hypoglycemia).   Yes Historical Provider, MD  hydrOXYzine (ATARAX/VISTARIL) 25 MG tablet Take 25 mg by mouth 3 (three) times daily as needed for anxiety.   Yes Historical Provider, MD  hyoscyamine (LEVSIN SL) 0.125 MG SL tablet Place 0.125 mg under the tongue every 4 (four) hours as needed for cramping or pain.   Yes Historical Provider, MD  Insulin Human (INSULIN PUMP) SOLN Pt uses Humalog -- up to 10 units per day.   Yes Historical Provider, MD  Multiple Vitamin (MULTIVITAMIN WITH MINERALS) TABS Take 2 tablets by mouth daily.    Yes Historical Provider, MD  omeprazole (PRILOSEC) 40 MG capsule Take 40 mg by mouth daily.    Yes Historical Provider, MD  oxyCODONE-acetaminophen (PERCOCET) 7.5-325 MG tablet Take 1 tablet by mouth every 4 (four) hours as needed for severe pain.    Yes Historical Provider, MD  Probiotic Product (DIGESTIVE ADVANTAGE GUMMIES PO) Take 2 each by mouth 2 (two) times daily.    Yes Historical Provider, MD  promethazine (PHENERGAN) 25 MG suppository Place 25 mg rectally every 6 (six) hours as needed for nausea or vomiting.   Yes Historical Provider, MD  promethazine (PHENERGAN) 25 MG tablet Take 1 tablet (25 mg total) by mouth every 6 (six) hours as needed for nausea or vomiting. 07/11/15  Yes Arnoldo Morale,  MD  saccharomyces boulardii (FLORASTOR) 250 MG capsule Take 1 capsule (250 mg total) by mouth 2 (two) times daily. 07/06/14  Yes Thurnell Lose, MD  traZODone (DESYREL) 50 MG tablet Take 1 tablet (50 mg total) by mouth at bedtime as needed for sleep. 07/11/15  Yes Arnoldo Morale, MD  vitamin B-12 (CYANOCOBALAMIN) 1000 MCG tablet Take 1,000 mcg by mouth 2 (two) times daily.   Yes Historical Provider, MD  cefUROXime (CEFTIN) 250 MG tablet Take 1 tablet (250 mg total) by mouth 2 (two) times daily with a meal. Patient not taking: Reported on 07/11/2015 07/08/15   Donne Hazel, MD    Family History Family History  Problem Relation Age of Onset  . Diabetes Maternal Grandmother   . Cancer Maternal Grandmother     Leukemia  . Cancer Maternal Grandfather     Small cell lung  cancer    Social History Social History  Substance Use Topics  . Smoking status: Former Smoker    Quit date: 04/19/2000  . Smokeless tobacco: Former Systems developer  . Alcohol use No     Comment: RARE SIPS OF WINE     Allergies   Morphine; Scallops [shellfish allergy]; Betamethasone; Diphenhydramine hcl; Haloperidol and related; Ketorolac; Latex; and Other   Review of Systems Review of Systems All other systems negative unless otherwise stated in HPI   Physical Exam Updated Vital Signs BP 143/80   Pulse 99   Temp 98.7 F (37.1 C) (Oral)   Resp 14   Ht 5\' 4"  (1.626 m)   Wt 46.3 kg   SpO2 100%   BMI 17.51 kg/m   Physical Exam  Constitutional: She is oriented to person, place, and time. She appears well-developed and well-nourished.  Non-toxic appearance. She does not have a sickly appearance. She does not appear ill.  HENT:  Head: Normocephalic and atraumatic.  Mouth/Throat: Oropharynx is clear and moist. No trismus in the jaw.  Left lower central incisor TTP with gingival swelling.  No drainable abscess.  No evidence of Ludwig angina.   Eyes: Conjunctivae are normal. Pupils are equal, round, and reactive to light.   Neck: Normal range of motion. Neck supple.  Cardiovascular: Regular rhythm.  Tachycardia present.   Pulmonary/Chest: Effort normal and breath sounds normal. No accessory muscle usage or stridor. No respiratory distress. She has no wheezes. She has no rhonchi. She has no rales.  Abdominal: Soft. Bowel sounds are normal. She exhibits no distension. There is generalized tenderness. There is no rebound and no guarding.  Musculoskeletal: Normal range of motion.  Lymphadenopathy:    She has no cervical adenopathy.  Neurological: She is alert and oriented to person, place, and time.  Speech clear without dysarthria.  Skin: Skin is warm and dry.  Psychiatric: She has a normal mood and affect. Her behavior is normal.     ED Treatments / Results  Labs (all labs ordered are listed, but only abnormal results are displayed) Labs Reviewed  CBC - Abnormal; Notable for the following:       Result Value   RDW 16.0 (*)    All other components within normal limits  URINALYSIS, ROUTINE W REFLEX MICROSCOPIC (NOT AT [facility]) - Abnormal; Notable for the following:    APPearance HAZY (*)    Glucose, UA >1000 (*)    Leukocytes, UA SMALL (*)    All other components within normal limits  COMPREHENSIVE METABOLIC PANEL - Abnormal; Notable for the following:    Sodium 126 (*)    Chloride 88 (*)    Glucose, Bld 700 (*)    BUN 22 (*)    Total Protein 8.6 (*)    AST 2,398 (*)    ALT 919 (*)    Alkaline Phosphatase 302 (*)    Total Bilirubin 2.6 (*)    All other components within normal limits  BLOOD GAS, VENOUS - Abnormal; Notable for the following:    pCO2, Ven 42.2 (*)    All other components within normal limits  URINE MICROSCOPIC-ADD ON - Abnormal; Notable for the following:    Squamous Epithelial / LPF 6-30 (*)    Bacteria, UA MANY (*)    All other components within normal limits  CBG MONITORING, ED - Abnormal; Notable for the following:    Glucose-Capillary >600 (*)    All other components within  normal limits  CBG MONITORING, ED -  Abnormal; Notable for the following:    Glucose-Capillary >600 (*)    All other components within normal limits  CBG MONITORING, ED - Abnormal; Notable for the following:    Glucose-Capillary 399 (*)    All other components within normal limits  CBG MONITORING, ED - Abnormal; Notable for the following:    Glucose-Capillary 264 (*)    All other components within normal limits  URINE CULTURE  LIPASE, BLOOD  HEPATITIS PANEL, ACUTE  POC URINE PREG, ED    EKG  EKG Interpretation  Date/Time:  Sunday November 02 2015 18:26:33 EDT Ventricular Rate:  106 PR Interval:    QRS Duration: 78 QT Interval:  347 QTC Calculation: 461 R Axis:   72 Text Interpretation:  Sinus tachycardia Low voltage, precordial leads Probable anteroseptal infarct, old since last tracing no significant change Confirmed by Eulis Foster  MD, ELLIOTT [id number]) on 11/02/2015 6:55:56 PM       Radiology No results found.  Procedures Procedures (including critical care time)  Medications Ordered in ED Medications  insulin regular (NOVOLIN R,HUMULIN R) 250 Units in sodium chloride 0.9 % 250 mL (1 Units/mL) infusion (2 Units/hr Intravenous Rate/Dose Change 11/02/15 2057)  fentaNYL (SUBLIMAZE) injection 50 mcg (50 mcg Intravenous Given 11/02/15 1835)  ondansetron (ZOFRAN) injection 4 mg (4 mg Intravenous Given 11/02/15 1835)  sodium chloride 0.9 % bolus 1,000 mL (0 mLs Intravenous Stopped 11/02/15 1958)  HYDROmorphone (DILAUDID) injection 1 mg (1 mg Intravenous Given 11/02/15 1917)  sodium chloride 0.9 % bolus 1,000 mL (0 mLs Intravenous Stopped 11/02/15 2051)  LORazepam (ATIVAN) injection 1 mg (1 mg Intravenous Given 11/02/15 2051)     Initial Impression / Assessment and Plan / ED Course  I have reviewed the triage vital signs and the nursing notes.  Pertinent labs & imaging results that were available during my care of the patient were reviewed by me and considered in my medical decision  making (see chart for details).  Clinical Course   Patient with DMT1 with hx of admission for DKA presents with N/V/D and generalized abdominal pain.  She is hyperglycemic, CBG >600.  Will obtain labs.  Will give fluids, symptom control, and place on glucose stabilizer.    She does not appear to be in DKA, no ketonuria, nl pH, no anion gap.  Although she is hyperglycemic.  Her LFTs and Tbili are elevated.  Will obtain ultrasound to evaluate for stone and add on hepatitis panel.  Patient is tolerating PO at this time.  She is receiving her 2nd liter NS.  Has received Dilaudid, Zofran, and Ativan for symptom control.  Plan to discharge home if able to continue PO intake and unremarkable ultrasound.  Home with zofran and penicillin for possible early dental infection.  Follow up with GI and dentist.  Patient care hand off to oncoming provider Donnald Garre, PA-C, at shift change who will follow up on ultrasound and appropriate disposition.     Final Clinical Impressions(s) / ED Diagnoses   Final diagnoses:  Hyperglycemia  Elevated LFTs  Elevated alkaline phosphatase level  Generalized abdominal pain  Pain, dental    New Prescriptions New Prescriptions   No medications on file     Vivien Rossetti 11/02/15 2059    Daleen Bo, MD 11/03/15 1949

## 2015-11-02 NOTE — Discharge Instructions (Signed)
Your ultrasound today was normal and reassuring. However, we are unsure why your liver enzymes are elevated. Please follow up with gastroenterology as soon as possible for re-evaluation. Avoid alcohol consumption as this could worsen your liver function. Continue home insulin pump use. Return to the ED if you experience severe worsening of your symptoms, continued vomiting, fevers. You will also likely need to see a dentist for possible gum infection. Take antibiotics as prescribed.

## 2015-11-02 NOTE — ED Notes (Signed)
Manuela Schwartz from the lab called to changed [id number] because she had to dilute and do it manually.

## 2015-11-02 NOTE — ED Triage Notes (Signed)
Pt c/o chronic fluctuating glucose and generalized abdominal pain and n/v/d x 2 days.  Pain score 9/10.  Hx of DM type 1.  Pt reports CBG read "High" prior to this RN entering room.  Sts she took and "ketone test" and it was "high."  Pt reports that she feels like she is in DKA.

## 2015-11-02 NOTE — ED Notes (Signed)
Pt reporting increase in pain 10/10 provider notified and awaiting further orders.

## 2015-11-02 NOTE — ED Notes (Signed)
Insulin drip stopped per provider and advised that pt can return to using her insulin pump as usual.

## 2015-11-02 NOTE — ED Notes (Signed)
Pt stopped insulin pump at this time accucheck 399.

## 2015-11-02 NOTE — ED Notes (Signed)
Ultrasound tech at bedside

## 2015-11-03 LAB — CBG MONITORING, ED: Glucose-Capillary: 112 mg/dL — ABNORMAL HIGH (ref 65–99)

## 2015-11-03 MED ORDER — HYDROMORPHONE HCL 2 MG/ML IJ SOLN
2.0000 mg | Freq: Once | INTRAMUSCULAR | Status: AC
  Administered 2015-11-03: 2 mg via INTRAVENOUS
  Filled 2015-11-03: qty 1

## 2015-11-03 MED ORDER — LORAZEPAM 2 MG/ML IJ SOLN
1.0000 mg | Freq: Once | INTRAMUSCULAR | Status: AC
  Administered 2015-11-03: 1 mg via INTRAVENOUS
  Filled 2015-11-03: qty 1

## 2015-11-03 NOTE — ED Notes (Signed)
In to d/c patient. Patient requesting u/s results as well as a round of nausea and pain medication before she leaves. Pt requesting one time dose of 2mg  IV of pain meds

## 2015-11-03 NOTE — ED Notes (Signed)
Pt to receive medications prior to discharge.

## 2015-11-04 LAB — HEPATITIS PANEL, ACUTE
HEP A IGM: NEGATIVE
HEP B C IGM: NEGATIVE
HEP B S AG: NEGATIVE

## 2015-11-05 LAB — URINE CULTURE: Culture: >=100000 — AB

## 2015-11-06 ENCOUNTER — Telehealth (HOSPITAL_COMMUNITY): Payer: Self-pay

## 2015-11-06 NOTE — Telephone Encounter (Signed)
Post ED Visit - Positive Culture Follow-up  Culture report reviewed by antimicrobial stewardship pharmacist:  []  Elenor Quinones, Pharm.D. []  Heide Guile, Pharm.D., BCPS []  Parks Neptune, Pharm.D. []  Alycia Rossetti, Pharm.D., BCPS []  [place], Pharm.D., BCPS, AAHIVP []  Legrand Como, Pharm.D., BCPS, AAHIVP []  Milus Glazier, Pharm.D. []  Stephens November, [place].D. [name], Pharm.D.  Positive  Urine culture, >/= 100,000 colonies -> Enterococcus species Treated with PCN  Chart reviewed by Arlean Hopping PA "No treatment"  Dortha Kern 11/06/2015, 4:59 PM

## 2015-11-19 ENCOUNTER — Other Ambulatory Visit: Payer: Self-pay | Admitting: Family Medicine

## 2015-11-19 DIAGNOSIS — G4700 Insomnia, unspecified: Secondary | ICD-10-CM

## 2015-12-01 ENCOUNTER — Encounter: Payer: Self-pay | Admitting: *Deleted

## 2015-12-02 ENCOUNTER — Ambulatory Visit
Admission: RE | Admit: 2015-12-02 | Discharge: 2015-12-02 | Disposition: A | Discharge status: Home / Self Care | Payer: Medicaid Other | Source: Ambulatory Visit | Attending: Gastroenterology | Admitting: Gastroenterology

## 2015-12-02 ENCOUNTER — Encounter
Admission: RE | Disposition: A | Discharge status: Home / Self Care | Payer: Self-pay | Source: Ambulatory Visit | Attending: Gastroenterology

## 2015-12-02 ENCOUNTER — Ambulatory Visit: Payer: Medicaid Other | Admitting: Anesthesiology

## 2015-12-02 DIAGNOSIS — F419 Anxiety disorder, unspecified: Secondary | ICD-10-CM | POA: Diagnosis not present

## 2015-12-02 DIAGNOSIS — Z87891 Personal history of nicotine dependence: Secondary | ICD-10-CM | POA: Insufficient documentation

## 2015-12-02 DIAGNOSIS — R634 Abnormal weight loss: Secondary | ICD-10-CM | POA: Insufficient documentation

## 2015-12-02 DIAGNOSIS — Z9641 Presence of insulin pump (external) (internal): Secondary | ICD-10-CM | POA: Insufficient documentation

## 2015-12-02 DIAGNOSIS — K529 Noninfective gastroenteritis and colitis, unspecified: Secondary | ICD-10-CM | POA: Insufficient documentation

## 2015-12-02 DIAGNOSIS — Z79899 Other long term (current) drug therapy: Secondary | ICD-10-CM | POA: Insufficient documentation

## 2015-12-02 DIAGNOSIS — E1065 Type 1 diabetes mellitus with hyperglycemia: Secondary | ICD-10-CM | POA: Diagnosis not present

## 2015-12-02 DIAGNOSIS — Z794 Long term (current) use of insulin: Secondary | ICD-10-CM | POA: Diagnosis not present

## 2015-12-02 HISTORY — PX: COLONOSCOPY WITH PROPOFOL: SHX5780

## 2015-12-02 LAB — BASIC METABOLIC PANEL
Anion gap: 6 (ref 5–15)
BUN: 7 mg/dL (ref 6–20)
CALCIUM: 8.4 mg/dL — AB (ref 8.9–10.3)
CO2: 26 mmol/L (ref 22–32)
CREATININE: 0.56 mg/dL (ref 0.44–1.00)
Chloride: 98 mmol/L — ABNORMAL LOW (ref 101–111)
GFR calc non Af Amer: >60 mL/min (ref >60)
Glucose, Bld: 519 mg/dL (ref 65–99)
Potassium: 4.1 mmol/L (ref 3.5–5.1)
SODIUM: 130 mmol/L — AB (ref 135–145)

## 2015-12-02 LAB — HEPATIC FUNCTION PANEL
ALBUMIN: 3.6 g/dL (ref 3.5–5.0)
ALT: 105 U/L — ABNORMAL HIGH (ref 14–54)
AST: 92 U/L — ABNORMAL HIGH (ref 15–41)
Alkaline Phosphatase: 199 U/L — ABNORMAL HIGH (ref 38–126)
Bilirubin, Direct: <0.1 mg/dL — ABNORMAL LOW (ref 0.1–0.5)
TOTAL PROTEIN: 6.8 g/dL (ref 6.5–8.1)
Total Bilirubin: <0.1 mg/dL — ABNORMAL LOW (ref 0.3–1.2)

## 2015-12-02 LAB — GLUCOSE, CAPILLARY
GLUCOSE-CAPILLARY: 434 mg/dL — AB (ref 65–99)
GLUCOSE-CAPILLARY: 249 mg/dL — AB (ref 65–99)
GLUCOSE-CAPILLARY: 155 mg/dL — AB (ref 65–99)
Glucose-Capillary: 532 mg/dL (ref 65–99)
Glucose-Capillary: 109 mg/dL — ABNORMAL HIGH (ref 65–99)

## 2015-12-02 LAB — POCT PREGNANCY, URINE: PREG TEST UR: NEGATIVE

## 2015-12-02 SURGERY — COLONOSCOPY WITH PROPOFOL
Anesthesia: General

## 2015-12-02 MED ORDER — MIDAZOLAM HCL 2 MG/2ML IJ SOLN
INTRAMUSCULAR | Status: DC | PRN
  Administered 2015-12-02: 2 mg via INTRAVENOUS

## 2015-12-02 MED ORDER — PROPOFOL 500 MG/50ML IV EMUL
INTRAVENOUS | Status: DC | PRN
  Administered 2015-12-02: 100 ug/kg/min via INTRAVENOUS

## 2015-12-02 MED ORDER — LACTATED RINGERS IV SOLN
INTRAVENOUS | Status: DC | PRN
  Administered 2015-12-02: 10:00:00 via INTRAVENOUS

## 2015-12-02 MED ORDER — SODIUM CHLORIDE 0.9 % IV SOLN: INTRAVENOUS | Status: DC

## 2015-12-02 MED ORDER — SODIUM CHLORIDE 0.9 % IV SOLN
INTRAVENOUS | Status: DC
  Administered 2015-12-02: 08:00:00 via INTRAVENOUS

## 2015-12-02 MED ORDER — PROPOFOL 10 MG/ML IV BOLUS
INTRAVENOUS | Status: DC | PRN
  Administered 2015-12-02: 50 mg via INTRAVENOUS

## 2015-12-02 NOTE — OR Nursing (Signed)
Patient administered I unit Humalog per insulin pump as per order.  Dr. Gustavo Lah in to assess, will start IV and monitor blood sugar.

## 2015-12-02 NOTE — OR Nursing (Signed)
Dr. Andree Elk aware of Blood sugar.  Patient to repeat I unit of Humalog per Insulin pump.

## 2015-12-02 NOTE — Anesthesia Preprocedure Evaluation (Signed)
Anesthesia Evaluation  Patient identified by MRN, date of birth, ID band Patient awake    Reviewed: Allergy & Precautions, H&P , NPO status , Patient's Chart, lab work & pertinent test results, reviewed documented beta blocker date and time   Airway Mallampati: II   Neck ROM: full    Dental  (+) Poor Dentition, Teeth Intact   Pulmonary neg pulmonary ROS, former smoker,    Pulmonary exam normal        Cardiovascular negative cardio ROS Normal cardiovascular exam Rhythm:regular Rate:Normal     Neuro/Psych Seizures -, Well Controlled,  PSYCHIATRIC DISORDERS negative neurological ROS  negative psych ROS   GI/Hepatic negative GI ROS, Neg liver ROS,   Endo/Other  negative endocrine ROSdiabetes, Well Controlled, Type 1, Insulin DependentHypothyroidism   Renal/GU negative Renal ROS  negative genitourinary   Musculoskeletal   Abdominal   Peds  Hematology negative hematology ROS (+)   Anesthesia Other Findings Past Medical History: No date: Anxiety No date: Diabetes mellitus without complication (HCC)     Comment: TYPE I No date: Hypothyroidism No date: Seizures [facility]) Past Surgical History: No date: BACK SURGERY     Comment: age 35 06/27/2014: ESOPHAGOGASTRODUODENOSCOPY (EGD) WITH PROPOFOL N/A     Comment: Procedure: ESOPHAGOGASTRODUODENOSCOPY (EGD)               WITH PROPOFOL;  Surgeon: Milus Banister, MD;                Location: [facility];  Service: Endoscopy;                Laterality: N/A; 06/24/2014: FLEXIBLE SIGMOIDOSCOPY N/A     Comment: Procedure: FLEXIBLE SIGMOIDOSCOPY;  Surgeon:               Milus Banister, MD;  Location: [facility];                Service: Endoscopy;  Laterality: N/A; 02/2002, 06/2010: LAPAROSCOPY     Comment: laparoscopy with lysis pelvic adhesions for               pelvic pain 10/10/12: ROOT CANAL No date: TOOTH EXTRACTION   Reproductive/Obstetrics                              Anesthesia Physical Anesthesia Plan  ASA: III  Anesthesia Plan: General   Post-op Pain Management:    Induction:   Airway Management Planned:   Additional Equipment:   Intra-op Plan:   Post-operative Plan:   Informed Consent: I have reviewed the patients History and Physical, chart, labs and discussed the procedure including the risks, benefits and alternatives for the proposed anesthesia with the patient or authorized representative who has indicated his/her understanding and acceptance.   Dental Advisory Given  Plan Discussed with: CRNA  Anesthesia Plan Comments:         Anesthesia Quick Evaluation

## 2015-12-02 NOTE — Op Note (Signed)
[facility]astroenterology Patient Name: [name] Procedure Date: 12/02/2015 7:29 AM MRN: [id number] Account #: [id number] Date of Birth: [birth year] Admit Type: Outpatient Age: 35 Room: [facility] ENDO ROOM 3 Gender: Female Note Status: Finalized Procedure:            Colonoscopy Indications:          Weight loss Providers:            Lollie Sails, MD Referring MD:         Arnoldo Morale (Referring MD) Medicines:            Monitored Anesthesia Care Complications:        No immediate complications. Procedure:            Pre-Anesthesia Assessment:                       - ASA Grade Assessment: III - A patient with severe                        systemic disease.                       After obtaining informed consent, the colonoscope was                        passed under direct vision. Throughout the procedure,                        the patient's blood pressure, pulse, and oxygen                        saturations were monitored continuously. The                        Colonoscope was introduced through the anus with the                        intention of advancing to the cecum. The scope was                        advanced to the descending colon before the procedure                        was aborted. Medications were given. The colonoscopy                        was unusually difficult due to poor bowel prep with                        stool present. The quality of the bowel preparation was                        poor. Findings:      The digital rectal exam was normal.      A moderate amount of solid stool was found in the rectum, in the sigmoid       colon and in the descending colon, precluding visualization. Impression:           - Preparation of the colon was poor.                       -  Stool in the rectum, in the sigmoid colon and in the                        descending colon.                       - No specimens collected. Recommendation:        - Discharge patient to home.                       - reprep and reschedule. Procedure Code(s):    --- Professional ---                       [phone], 76, Colonoscopy, flexible; diagnostic, including                        collection of specimen(s) by brushing or washing, when                        performed (separate procedure) Diagnosis Code(s):    --- Professional ---                       R63.4, Abnormal weight loss CPT copyright 2016 American Medical Association. All rights reserved. The codes documented in this report are preliminary and upon coder review may  be revised to meet current compliance requirements. Lollie Sails, MD 12/02/2015 10:47:40 AM This report has been signed electronically. Number of Addenda: 0 Note Initiated On: 12/02/2015 7:29 AM Total Procedure Duration: 0 hours 3 minutes 38 seconds       [facility]

## 2015-12-02 NOTE — Transfer of Care (Signed)
Immediate Anesthesia Transfer of Care Note  Patient: [name]  Procedure(s) Performed: Procedure(s): COLONOSCOPY WITH PROPOFOL (N/A)  Patient Location: PACU  Anesthesia Type:General  Level of Consciousness: awake, alert  and oriented  Airway & Oxygen Therapy: Patient Spontanous Breathing and Patient connected to nasal cannula oxygen  Post-op Assessment: Report given to RN and Post -op Vital signs reviewed and stable  Post vital signs: Reviewed and stable  Last Vitals:  Vitals:   12/02/15 0721  BP: 105/71  Pulse: (!) 109  Temp: (!) 35.6 C    Last Pain:  Vitals:   12/02/15 0721  TempSrc: Tympanic         Complications: No apparent anesthesia complications

## 2015-12-02 NOTE — OR Nursing (Signed)
Patient has multiple body .  Blood sugar 532 reported to Dr. Andree Elk.

## 2015-12-02 NOTE — Consult Note (Signed)
[place] [facility] [place] NAME: [name]    MR#:  [id number]  DATE OF BIRTH:  06/27/1980  DATE OF ADMISSION:  12/02/2015  PRIMARY CARE PHYSICIAN: Arnoldo Morale, MD   REQUESTING/REFERRING PHYSICIAN: Donnella Sham  CHIEF COMPLAINT:  Elevated glucose  HISTORY OF PRESENT ILLNESS:  [name]  is a 35 y.o. female with a known history of Type I diabetic, brittle who is presenting for colonoscopy for chronic recurrent diarrhea. In preop noted to have elevated blood glucose greater than 500. Patient denies any symptoms. She states with being nothing by mouth she has taken no insulin this morning, typically only requires about 10 units of insulin daily via insulin pump. She took one insulin with the above reading blood glucose dropped onto the 400 range. He should have blood work performed which reveals she was not acidotic. Consult in regards to type 1 diabetes, poorly controlled.  PAST MEDICAL HISTORY:   Past Medical History:  Diagnosis Date  . Anxiety   . Diabetes mellitus without complication ([place])    TYPE I  . Hypothyroidism   . Seizures ([place])     PAST SURGICAL HISTOIRY:   Past Surgical History:  Procedure Laterality Date  . BACK SURGERY     age 28  . ESOPHAGOGASTRODUODENOSCOPY (EGD) WITH PROPOFOL N/A 06/27/2014   Procedure: ESOPHAGOGASTRODUODENOSCOPY (EGD) WITH PROPOFOL;  Surgeon: Milus Banister, MD;  Location: [facility];  Service: Endoscopy;  Laterality: N/A;  . FLEXIBLE SIGMOIDOSCOPY N/A 06/24/2014   Procedure: FLEXIBLE SIGMOIDOSCOPY;  Surgeon: Milus Banister, MD;  Location: [facility];  Service: Endoscopy;  Laterality: N/A;  . LAPAROSCOPY  02/2002, 06/2010   laparoscopy with lysis pelvic adhesions for pelvic pain  . ROOT CANAL  10/10/12  . TOOTH EXTRACTION      SOCIAL HISTORY:   Social History  Substance Use Topics  . Smoking status: Former Smoker    Quit date: 04/19/2000  . Smokeless tobacco: Former Systems developer  . Alcohol use  No     Comment: RARE SIPS OF WINE    FAMILY HISTORY:   Family History  Problem Relation Age of Onset  . Diabetes Maternal Grandmother   . Cancer Maternal Grandmother     Leukemia  . Cancer Maternal Grandfather     Small cell lung cancer    DRUG ALLERGIES:   Allergies  Allergen Reactions  . Morphine Anaphylaxis, Itching and Other (See Comments)    Pt states that her lips turn blue.    Elyse Hsu [Shellfish Allergy] Anaphylaxis  . Betamethasone Swelling and Other (See Comments)    Reaction:  Facial swelling  . Diphenhydramine Hcl Nausea Only and Other (See Comments)    Pt states that she is only allergic to the IV form.    . Haloperidol And Related Itching  . Ketorolac Itching  . Latex Rash  . Other Rash and Other (See Comments)    Pt states that she is allergic to sunscreen and grass.     REVIEW OF SYSTEMS:  CONSTITUTIONAL: No fever, fatigue or weakness.  EYES: No blurred or double vision.  EARS, NOSE, AND THROAT: No tinnitus or ear pain.  RESPIRATORY: No cough, shortness of breath, wheezing or hemoptysis.  CARDIOVASCULAR: No chest pain, orthopnea, edema.  GASTROINTESTINAL: No nausea, vomiting,Positive diarrhea denies abdominal pain.  GENITOURINARY: No dysuria, hematuria.  ENDOCRINE: No polyuria, nocturia,  HEMATOLOGY: No anemia, easy bruising or bleeding SKIN: No rash or lesion. MUSCULOSKELETAL: No joint pain or arthritis.  NEUROLOGIC: No tingling, numbness, weakness.  PSYCHIATRY: No anxiety or depression.   MEDICATIONS AT HOME:   Prior to Admission medications   Medication Sig Start Date End Date Taking? Authorizing Provider  calcium carbonate (OSCAL) 1500 (600 Ca) MG TABS tablet Take 1,200 mg of elemental calcium by mouth daily with breakfast.   Yes Historical Provider, MD  cyclobenzaprine (FLEXERIL) 10 MG tablet Take 10 mg by mouth 3 (three) times daily as needed for muscle spasms.    Yes Historical Provider, MD  oxyCODONE-acetaminophen (PERCOCET) 7.5-325 MG  tablet Take 1 tablet by mouth every 4 (four) hours as needed for severe pain.    Yes Historical Provider, MD  acetaminophen (TYLENOL) 500 MG tablet Take 500-1,000 mg by mouth every 6 (six) hours as needed for mild pain, moderate pain or headache.     Historical Provider, MD  ALPRAZolam Duanne Moron) 1 MG tablet Take 2 mg by mouth at bedtime.    Historical Provider, MD  amitriptyline (ELAVIL) 50 MG tablet Take 100 mg by mouth at bedtime.    Historical Provider, MD  cholestyramine (QUESTRAN) 4 g packet Take 4 g by mouth 4 (four) times daily.    Historical Provider, MD  CREON 36000 UNITS CPEP capsule Take 144,000-216,000 Units by mouth 5 (five) times daily. Pt takes six capsules three times daily with meals and four capsules two times daily with snacks.    Historical Provider, MD  dicyclomine (BENTYL) 10 MG capsule Take 10 mg by mouth 4 (four) times daily -  before meals and at bedtime.     Historical Provider, MD  diphenoxylate-atropine (LOMOTIL) 2.5-0.025 MG per tablet Take 1 tablet by mouth 4 (four) times daily as needed for diarrhea or loose stools. 11/08/14   Orpah Greek, MD  feeding supplement, GLUCERNA SHAKE, (GLUCERNA SHAKE) LIQD Take 237 mLs by mouth 6 (six) times daily.    Historical Provider, MD  gabapentin (NEURONTIN) 300 MG capsule Take 300 mg by mouth 3 (three) times daily.    Historical Provider, MD  glucagon (GLUCAGON EMERGENCY) 1 MG injection Inject 1 mg into the vein once as needed (for severe hypoglycemia).    Historical Provider, MD  hydrOXYzine (ATARAX/VISTARIL) 25 MG tablet Take 25 mg by mouth 3 (three) times daily as needed for anxiety.    Historical Provider, MD  hyoscyamine (LEVSIN SL) 0.125 MG SL tablet Place 0.125 mg under the tongue every 4 (four) hours as needed for cramping or pain.    Historical Provider, MD  Insulin Human (INSULIN PUMP) SOLN Pt uses Humalog -- up to 10 units per day.    Historical Provider, MD  Multiple Vitamin (MULTIVITAMIN WITH MINERALS) TABS Take 2  tablets by mouth daily.     Historical Provider, MD  omeprazole (PRILOSEC) 40 MG capsule Take 40 mg by mouth daily.     Historical Provider, MD  ondansetron (ZOFRAN ODT) 4 MG disintegrating tablet Take 1 tablet (4 mg total) by mouth every 8 (eight) hours as needed for nausea or vomiting. 11/02/15   Samantha Tripp Dowless, PA-C  Probiotic Product (DIGESTIVE ADVANTAGE GUMMIES PO) Take 2 each by mouth 2 (two) times daily.     Historical Provider, MD  promethazine (PHENERGAN) 25 MG suppository Place 25 mg rectally every 6 (six) hours as needed for nausea or vomiting.    Historical Provider, MD  promethazine (PHENERGAN) 25 MG tablet Take 1 tablet (25 mg total) by mouth every 6 (six) hours as needed for nausea or vomiting. 07/11/15   Arnoldo Morale, MD  saccharomyces boulardii (FLORASTOR) 250 MG capsule Take 1 capsule (250 mg total) by mouth 2 (two) times daily. 07/06/14   Thurnell Lose, MD  traZODone (DESYREL) 50 MG tablet TAKE 1 TABLET BY MOUTH AT BEDTIME AS NEEDED FOR SLEEP. 11/20/15   Arnoldo Morale, MD  vitamin B-12 (CYANOCOBALAMIN) 1000 MCG tablet Take 1,000 mcg by mouth 2 (two) times daily.    Historical Provider, MD      VITAL SIGNS:  Blood pressure 105/71, pulse (!) 109, temperature (!) 96 F (35.6 C), temperature source Tympanic, height 5\' 5"  (1.651 m), weight 47.6 kg (105 lb), SpO2 100 %.  PHYSICAL EXAMINATION:  GENERAL:  35 y.o.-year-old patient lying in the bed with no acute distress.  EYES: Pupils equal, round, reactive to light and accommodation. No scleral icterus. Extraocular muscles intact.  HEENT: Head atraumatic, normocephalic. Oropharynx and nasopharynx clear.  NECK:  Supple, no jugular venous distention. No thyroid enlargement, no tenderness.  LUNGS: Normal breath sounds bilaterally, no wheezing, rales,rhonchi or crepitation. No use of accessory muscles of respiration.  CARDIOVASCULAR: S1, S2 normal. No murmurs, rubs, or gallops.  ABDOMEN: Soft, nontender, nondistended. Bowel sounds  present. No organomegaly or mass.  EXTREMITIES: No pedal edema, cyanosis, or clubbing.  NEUROLOGIC: Cranial nerves II through XII are intact. Muscle strength 5/5 in all extremities. Sensation intact. Gait not checked.  PSYCHIATRIC: The patient is alert and oriented x 3.  SKIN: No obvious rash, lesion, or ulcer.   LABORATORY PANEL:   CBC No results for input(s): WBC, HGB, HCT, PLT in the last 168 hours. ------------------------------------------------------------------------------------------------------------------  Chemistries   Recent Labs Lab 12/02/15 0823  NA 130*  K 4.1  CL 98*  CO2 26  GLUCOSE 519*  BUN 7  CREATININE 0.56  CALCIUM 8.4*  AST 92*  ALT 105*  ALKPHOS 199*  BILITOT <0.1*   ------------------------------------------------------------------------------------------------------------------  Cardiac Enzymes No results for input(s): TROPONINI in the last 168 hours. ------------------------------------------------------------------------------------------------------------------  RADIOLOGY:  No results found.  EKG:   Orders placed or performed during the hospital encounter of 11/02/15  . ED EKG  . ED EKG  . EKG 12-Lead  . EKG 12-Lead  . EKG    IMPRESSION AND PLAN:   35 year old Caucasian female history of type 1 diabetes brittle controlled with insulin pump  1. Type 1 diabetes with hyperglycemia: Patient did not take any insulin this morning and found to have elevated glucose, took 1 units from her insulin pump with some improvement. I feel that she is able to undergo colonoscopy without any hesitation. If her blood glucoses in the elevator afterwards she can management via her insulin pump. She has no current symptoms and blood work is reassuring at this time.  2. Chronic diarrhea: Proceed with colonoscopy per gastroenterology then discharged afterwards  If in recovery her glucose remains difficult to control please recall    All the records  are reviewed and case discussed with Consulting provider. Management plans discussed with the patient, family and they are in agreement.  CODE STATUS: full  TOTAL TIME TAKING CARE OF THIS PATIENT: 33 minutes.    Zacary Bauer,  Karenann Cai.D on 12/02/2015 at 9:48 AM  Between 7am to 6pm - Pager - [phone]  After 6pm: House Pager: - [phone]  [facility]  Office  [phone]  CC: Primary care Physician: Arnoldo Morale, MD

## 2015-12-02 NOTE — OR Nursing (Signed)
Blood sugar 249.  Reported to Drs. Sk[place] and [employer].

## 2015-12-02 NOTE — OR Nursing (Signed)
Patient will administer 1 unit of her insulin per insulin pump.  Dr. Andree Elk ordered this.

## 2015-12-02 NOTE — OR Nursing (Signed)
Blood sugar repeated 434.  Reported to anesthesia and Dr. Gustavo Lah.

## 2015-12-02 NOTE — H&P (Addendum)
Outpatient short stay form Pre-procedure 12/02/2015 8:10 AM Mandy Sails MD  Primary Physician: Dr Arnoldo Morale  Reason for visit:  Colonoscopy  History of present illness:  Patient is a 35 year old female with a history of chronic diarrhea. She has had C. difficile in the past 3 times. Her most recent C. difficile checked was negative however. She has had chronic diarrhea. It is of note that she is a brittle diabetic and states she has been in the hospital probably 8 times in the past year for DKA. She has been on cholestyramine and Creon as well as hyoscyamine and Imodium.  On presentation this morning blood glucose checked indicated a 534. She is very difficult to regulate per patient and she does have a insulin pump using Humalog.  She apparently has had extensive evaluations in the past for this issue of diarrhea. Her last colonoscopy was about 2 years ago per patient however I'm not able find this result. She has had a liver biopsy for evaluation of abnormal liver associated enzymes with a positive ANA. 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    Current Facility-Administered Medications:  .  0.9 %  sodium chloride infusion, , Intravenous, Continuous, Mandy Sails, MD  Prescriptions Prior to Admission  Medication Sig Dispense Refill Last Dose  . calcium carbonate (OSCAL) 1500 (600 Ca) MG TABS tablet Take 1,200 mg of elemental calcium by mouth daily with breakfast.   12/01/2015 at 0700  . cyclobenzaprine (FLEXERIL) 10 MG tablet Take 10 mg by mouth 3 (three) times daily as needed for muscle spasms.   0 12/01/2015 at 2000  . oxyCODONE-acetaminophen (PERCOCET) 7.5-325 MG tablet Take 1 tablet by mouth every 4 (four) hours as needed for severe pain.   0 12/02/2015 at 0400  . acetaminophen (TYLENOL) 500 MG tablet Take 500-1,000 mg by mouth every 6 (six) hours as needed for  mild pain, moderate pain or headache.    Past Week at Unknown time  . ALPRAZolam (XANAX) 1 MG tablet Take 2 mg by mouth at bedtime.   11/01/2015 at Unknown time  . amitriptyline (ELAVIL) 50 MG tablet Take 100 mg by mouth at bedtime.   11/01/2015 at Unknown time  . cholestyramine (QUESTRAN) 4 g packet Take 4 g by mouth 4 (four) times daily.   11/02/2015 at Unknown time  . CREON 36000 UNITS CPEP capsule Take 144,000-216,000 Units by mouth 5 (five) times daily. Pt takes six capsules three times daily with meals and four capsules two times daily with snacks.  1 11/02/2015 at Unknown time  . dicyclomine (BENTYL) 10 MG capsule Take 10 mg by mouth 4 (four) times daily -  before meals and at bedtime.    11/02/2015 at Unknown time  . diphenoxylate-atropine (LOMOTIL) 2.5-0.025 MG per tablet Take 1 tablet by mouth 4 (four) times daily as needed for diarrhea or loose stools. 30 tablet 0 11/01/2015 at Unknown time  . feeding supplement, GLUCERNA SHAKE, (GLUCERNA SHAKE) LIQD Take 237 mLs by mouth 6 (six) times daily.   11/02/2015 at Unknown time  . gabapentin (NEURONTIN) 300 MG capsule Take 300 mg by mouth 3 (three) times daily.   11/02/2015 at Unknown time  . glucagon (GLUCAGON EMERGENCY) 1 MG injection Inject 1 mg into the vein once as needed (for severe hypoglycemia).   PRN at PRN  . hydrOXYzine (ATARAX/VISTARIL) 25 MG tablet Take 25 mg by  mouth 3 (three) times daily as needed for anxiety.   11/02/2015 at Unknown time  . hyoscyamine (LEVSIN SL) 0.125 MG SL tablet Place 0.125 mg under the tongue every 4 (four) hours as needed for cramping or pain.  0 PRN at PRN  . Insulin Human (INSULIN PUMP) SOLN Pt uses Humalog -- up to 10 units per day.   11/02/2015 at Unknown time  . Multiple Vitamin (MULTIVITAMIN WITH MINERALS) TABS Take 2 tablets by mouth daily.    11/02/2015 at Unknown time  . omeprazole (PRILOSEC) 40 MG capsule Take 40 mg by mouth daily.    11/02/2015 at Unknown time  . ondansetron (ZOFRAN ODT) 4 MG disintegrating  tablet Take 1 tablet (4 mg total) by mouth every 8 (eight) hours as needed for nausea or vomiting. 20 tablet 0   . Probiotic Product (DIGESTIVE ADVANTAGE GUMMIES PO) Take 2 each by mouth 2 (two) times daily.    11/02/2015 at Unknown time  . promethazine (PHENERGAN) 25 MG suppository Place 25 mg rectally every 6 (six) hours as needed for nausea or vomiting.   11/02/2015 at Unknown time  . promethazine (PHENERGAN) 25 MG tablet Take 1 tablet (25 mg total) by mouth every 6 (six) hours as needed for nausea or vomiting. 30 tablet 2 11/01/2015 at Unknown time  . saccharomyces boulardii (FLORASTOR) 250 MG capsule Take 1 capsule (250 mg total) by mouth 2 (two) times daily. 60 capsule 2 11/01/2015 at Unknown time  . traZODone (DESYREL) 50 MG tablet TAKE 1 TABLET BY MOUTH AT BEDTIME AS NEEDED FOR SLEEP. 30 tablet 0   . vitamin B-12 (CYANOCOBALAMIN) 1000 MCG tablet Take 1,000 mcg by mouth 2 (two) times daily.   11/02/2015 at Unknown time     Allergies  Allergen Reactions  . Morphine Anaphylaxis, Itching and Other (See Comments)    Pt states that her lips turn blue.    Elyse Hsu [Shellfish Allergy] Anaphylaxis  . Betamethasone Swelling and Other (See Comments)    Reaction:  Facial swelling  . Diphenhydramine Hcl Nausea Only and Other (See Comments)    Pt states that she is only allergic to the IV form.    . Haloperidol And Related Itching  . Ketorolac Itching  . Latex Rash  . Other Rash and Other (See Comments)    Pt states that she is allergic to sunscreen and grass.      Past Medical History:  Diagnosis Date  . Anxiety   . Diabetes mellitus without complication ([place])    TYPE I  . Hypothyroidism   . Seizures ([place])     Review of systems:      Physical Exam    Heart and lungs: Regular rate and rhythm without rub or gallop, lungs are bilaterally clear.    HEENT: Septic atraumatic eyes are anicteric    Other:     Pertinant exam for procedure: Soft mild generalized discomfort, no rebound or  masses noted, nondistended bowel sounds positive normoactive.    Planned proceedures: 1. Colonoscopy and indicated procedures. 2. Due to patient's high glucose/blood sugar level today and in discussion with patient she states that her response to insulin is quite variable will allow her to give herself a unit of insulin via pump. We'll hold onto her recheck the glucose in about an hour. We'll do labs today including a hepatic profile metabolic panel and SCL 70. It may be necessary to move toward an admission for her and hold the colonoscopy until she is more stable.  Mandy Sails, MD Gastroenterology 12/02/2015  8:10 AM   Consult was requested from internal medicine. They have evaluated and indicated they felt she was good for the colonoscopy. Such we'll proceed. Recheck of her blood sugar is now in the 200s. She has a normal CO2. I have discussed the risks benefits and complications of procedures to include not limited to bleeding, infection, perforation and the risk of sedation and the patient wishes to proceed.

## 2015-12-03 ENCOUNTER — Encounter: Payer: Self-pay | Admitting: Gastroenterology

## 2015-12-03 LAB — ANTI-SCLERODERMA ANTIBODY

## 2015-12-03 NOTE — Anesthesia Postprocedure Evaluation (Signed)
Anesthesia Post Note  Patient: [name]  Procedure(s) Performed: Procedure(s) (LRB): COLONOSCOPY WITH PROPOFOL (N/A)  Patient location during evaluation: PACU Anesthesia Type: General Level of consciousness: awake and alert Pain management: pain level controlled Vital Signs Assessment: post-procedure vital signs reviewed and stable Respiratory status: spontaneous breathing, nonlabored ventilation, respiratory function stable and patient connected to nasal cannula oxygen Cardiovascular status: blood pressure returned to baseline and stable Postop Assessment: no signs of nausea or vomiting Anesthetic complications: no    Last Vitals:  Vitals:   12/02/15 1120 12/02/15 1130  BP: (!) 96/57 105/69  Pulse: 95 99  Resp: 14 18  Temp:      Last Pain:  Vitals:   12/03/15 0740  TempSrc:   PainSc: 0-No pain                 Molli Barrows

## 2015-12-16 ENCOUNTER — Other Ambulatory Visit: Payer: Self-pay | Admitting: Family Medicine

## 2015-12-18 ENCOUNTER — Other Ambulatory Visit: Payer: Self-pay | Admitting: Family Medicine

## 2015-12-18 DIAGNOSIS — E1043 Type 1 diabetes mellitus with diabetic autonomic (poly)neuropathy: Secondary | ICD-10-CM

## 2015-12-18 DIAGNOSIS — K3184 Gastroparesis: Principal | ICD-10-CM

## 2016-01-02 ENCOUNTER — Ambulatory Visit: Payer: Medicaid Other | Admitting: Anesthesiology

## 2016-01-02 ENCOUNTER — Encounter: Payer: Self-pay | Admitting: *Deleted

## 2016-01-02 ENCOUNTER — Ambulatory Visit
Admission: RE | Admit: 2016-01-02 | Discharge: 2016-01-02 | Disposition: A | Discharge status: Home / Self Care | Payer: Medicaid Other | Source: Ambulatory Visit | Attending: Gastroenterology | Admitting: Gastroenterology

## 2016-01-02 ENCOUNTER — Encounter
Admission: RE | Disposition: A | Discharge status: Home / Self Care | Payer: Self-pay | Source: Ambulatory Visit | Attending: Gastroenterology

## 2016-01-02 DIAGNOSIS — Z79899 Other long term (current) drug therapy: Secondary | ICD-10-CM | POA: Diagnosis not present

## 2016-01-02 DIAGNOSIS — F419 Anxiety disorder, unspecified: Secondary | ICD-10-CM | POA: Insufficient documentation

## 2016-01-02 DIAGNOSIS — Z681 Body mass index (BMI) 19 or less, adult: Secondary | ICD-10-CM | POA: Insufficient documentation

## 2016-01-02 DIAGNOSIS — Z9641 Presence of insulin pump (external) (internal): Secondary | ICD-10-CM | POA: Insufficient documentation

## 2016-01-02 DIAGNOSIS — Z794 Long term (current) use of insulin: Secondary | ICD-10-CM | POA: Insufficient documentation

## 2016-01-02 DIAGNOSIS — K529 Noninfective gastroenteritis and colitis, unspecified: Secondary | ICD-10-CM | POA: Insufficient documentation

## 2016-01-02 DIAGNOSIS — E109 Type 1 diabetes mellitus without complications: Secondary | ICD-10-CM | POA: Diagnosis not present

## 2016-01-02 DIAGNOSIS — Z87891 Personal history of nicotine dependence: Secondary | ICD-10-CM | POA: Diagnosis not present

## 2016-01-02 DIAGNOSIS — Q439 Congenital malformation of intestine, unspecified: Secondary | ICD-10-CM | POA: Diagnosis not present

## 2016-01-02 DIAGNOSIS — R634 Abnormal weight loss: Secondary | ICD-10-CM | POA: Diagnosis not present

## 2016-01-02 HISTORY — PX: COLONOSCOPY WITH PROPOFOL: SHX5780

## 2016-01-02 LAB — GLUCOSE, CAPILLARY: GLUCOSE-CAPILLARY: 365 mg/dL — AB (ref 65–99)

## 2016-01-02 LAB — POCT PREGNANCY, URINE: PREG TEST UR: NEGATIVE

## 2016-01-02 SURGERY — COLONOSCOPY WITH PROPOFOL
Anesthesia: General

## 2016-01-02 MED ORDER — SODIUM CHLORIDE 0.9 % IV SOLN: INTRAVENOUS | Status: DC

## 2016-01-02 MED ORDER — MIDAZOLAM HCL 2 MG/2ML IJ SOLN
INTRAMUSCULAR | Status: DC | PRN
  Administered 2016-01-02: 1 mg via INTRAVENOUS

## 2016-01-02 MED ORDER — PROPOFOL 500 MG/50ML IV EMUL
INTRAVENOUS | Status: DC | PRN
  Administered 2016-01-02: 150 ug/kg/min via INTRAVENOUS

## 2016-01-02 MED ORDER — FENTANYL CITRATE (PF) 100 MCG/2ML IJ SOLN
INTRAMUSCULAR | Status: DC | PRN
  Administered 2016-01-02: 50 ug via INTRAVENOUS

## 2016-01-02 MED ORDER — SODIUM CHLORIDE 0.9 % IV SOLN
INTRAVENOUS | Status: DC
Start: 2016-01-02 — End: 2016-01-02
  Administered 2016-01-02 (×2): via INTRAVENOUS

## 2016-01-02 MED ORDER — PROPOFOL 10 MG/ML IV BOLUS
INTRAVENOUS | Status: DC | PRN
  Administered 2016-01-02: 40 mg via INTRAVENOUS
  Administered 2016-01-02 (×2): 50 mg via INTRAVENOUS

## 2016-01-02 NOTE — Anesthesia Postprocedure Evaluation (Signed)
Anesthesia Post Note  Patient: [name]  Procedure(s) Performed: Procedure(s) (LRB): COLONOSCOPY WITH PROPOFOL (N/A)  Patient location during evaluation: Endoscopy Anesthesia Type: General Level of consciousness: awake and alert and oriented Pain management: pain level controlled Vital Signs Assessment: post-procedure vital signs reviewed and stable Respiratory status: spontaneous breathing, nonlabored ventilation and respiratory function stable Cardiovascular status: blood pressure returned to baseline and stable Postop Assessment: no signs of nausea or vomiting Anesthetic complications: no    Last Vitals:  Vitals:   01/02/16 0757  BP: 105/69  Pulse: 95  Resp: 16  Temp: 36.5 C    Last Pain:  Vitals:   01/02/16 0757  TempSrc: Tympanic  PainSc: 7                  Ayron Fillinger

## 2016-01-02 NOTE — H&P (Signed)
Outpatient short stay form Pre-procedure 01/02/2016 8:35 AM Mandy Sails MD  Primary Physician: Dr Arnoldo Morale  Reason for visit:  Colonoscopy  History of present illness:  Patient is a 35 year old female presenting today as above. She has a recent history of loss however she is also has a history of chronic diarrhea. She had a CT scan months ago eating some thickening in the descending colon which on subsequent CT scan was found to be resolved. She is a insulin-dependent diabetic. She does have a history also of nausea and vomiting. There was a history of C. difficile in the past 3. Her most recent C. difficile checked was negative however. He is probably been in the hospital about 8 times the past year for DKA. She is currently on cholestyramine as well as Creon, hyoscyamine and Imodium. She does have a positive ANA however liver evaluation has been further uninformative in regards to her history of elevated LFTs.  She tolerated her prep well. She takes no aspirin or blood thinning agents.    Current Facility-Administered Medications:  .  0.9 %  sodium chloride infusion, , Intravenous, Continuous, Mandy Sails, MD .  0.9 %  sodium chloride infusion, , Intravenous, Continuous, Mandy Sails, MD .  0.9 %  sodium chloride infusion, , Intravenous, Continuous, Mandy Sails, MD  Prescriptions Prior to Admission  Medication Sig Dispense Refill Last Dose  . ALPRAZolam (XANAX) 1 MG tablet Take 2 mg by mouth at bedtime.   01/01/2016 at Unknown time  . amitriptyline (ELAVIL) 50 MG tablet Take 100 mg by mouth at bedtime.   01/01/2016 at Unknown time  . cholestyramine (QUESTRAN) 4 g packet Take 4 g by mouth 4 (four) times daily.   01/01/2016 at Unknown time  . CREON 36000 UNITS CPEP capsule Take 144,000-216,000 Units by mouth 5 (five) times daily. Pt takes six capsules three times daily with meals and four capsules two times daily with snacks.  1 01/01/2016 at Unknown time  .  cyclobenzaprine (FLEXERIL) 10 MG tablet Take 10 mg by mouth 3 (three) times daily as needed for muscle spasms.   0 01/01/2016 at Unknown time  . dicyclomine (BENTYL) 10 MG capsule Take 10 mg by mouth 4 (four) times daily -  before meals and at bedtime.    01/01/2016 at Unknown time  . diphenoxylate-atropine (LOMOTIL) 2.5-0.025 MG per tablet Take 1 tablet by mouth 4 (four) times daily as needed for diarrhea or loose stools. 30 tablet 0 01/01/2016 at Unknown time  . gabapentin (NEURONTIN) 300 MG capsule Take 300 mg by mouth 3 (three) times daily.   01/01/2016 at Unknown time  . hyoscyamine (LEVSIN SL) 0.125 MG SL tablet Place 0.125 mg under the tongue every 4 (four) hours as needed for cramping or pain.  0 01/01/2016 at Unknown time  . omeprazole (PRILOSEC) 40 MG capsule Take 40 mg by mouth daily.    01/01/2016 at Unknown time  . oxyCODONE-acetaminophen (PERCOCET) 7.5-325 MG tablet Take 1 tablet by mouth every 4 (four) hours as needed for severe pain.   0 01/02/2016 at 0300  . traZODone (DESYREL) 50 MG tablet TAKE 1 TABLET BY MOUTH AT BEDTIME AS NEEDED FOR SLEEP. 30 tablet 0 01/01/2016 at Unknown time  . acetaminophen (TYLENOL) 500 MG tablet Take 500-1,000 mg by mouth every 6 (six) hours as needed for mild pain, moderate pain or headache.    Past Week at Unknown time  . calcium carbonate (OSCAL) 1500 (600 Ca) MG TABS  tablet Take 1,200 mg of elemental calcium by mouth daily with breakfast.   12/31/2015  . feeding supplement, GLUCERNA SHAKE, (GLUCERNA SHAKE) LIQD Take 237 mLs by mouth 6 (six) times daily.   11/02/2015 at Unknown time  . glucagon (GLUCAGON EMERGENCY) 1 MG injection Inject 1 mg into the vein once as needed (for severe hypoglycemia).   PRN at PRN  . hydrOXYzine (ATARAX/VISTARIL) 25 MG tablet Take 25 mg by mouth 3 (three) times daily as needed for anxiety.   11/02/2015 at Unknown time  . Insulin Human (INSULIN PUMP) SOLN Pt uses Humalog -- up to 10 units per day.   11/02/2015 at Unknown time  . Multiple  Vitamin (MULTIVITAMIN WITH MINERALS) TABS Take 2 tablets by mouth daily.    12/31/2015  . ondansetron (ZOFRAN ODT) 4 MG disintegrating tablet Take 1 tablet (4 mg total) by mouth every 8 (eight) hours as needed for nausea or vomiting. 20 tablet 0   . Probiotic Product (DIGESTIVE ADVANTAGE GUMMIES PO) Take 2 each by mouth 2 (two) times daily.    12/31/2015  . promethazine (PHENERGAN) 25 MG suppository Place 25 mg rectally every 6 (six) hours as needed for nausea or vomiting.   11/02/2015 at Unknown time  . promethazine (PHENERGAN) 25 MG tablet TAKE 1 TABLET (25 MG TOTAL) BY MOUTH EVERY 6 (SIX) HOURS AS NEEDED FOR NAUSEA OR VOMITING. 30 tablet 1 12/30/2015  . saccharomyces boulardii (FLORASTOR) 250 MG capsule Take 1 capsule (250 mg total) by mouth 2 (two) times daily. 60 capsule 2 12/31/2015  . vitamin B-12 (CYANOCOBALAMIN) 1000 MCG tablet Take 1,000 mcg by mouth 2 (two) times daily.   12/31/2015     Allergies  Allergen Reactions  . Morphine Anaphylaxis, Itching and Other (See Comments)    Pt states that her lips turn blue.    Elyse Hsu [Shellfish Allergy] Anaphylaxis  . Betamethasone Swelling and Other (See Comments)    Reaction:  Facial swelling  . Diphenhydramine Hcl Nausea Only and Other (See Comments)    Pt states that she is only allergic to the IV form.    . Haloperidol And Related Itching  . Ketorolac Itching  . Latex Rash  . Other Rash and Other (See Comments)    Pt states that she is allergic to sunscreen and grass.      Past Medical History:  Diagnosis Date  . Anxiety   . Diabetes mellitus without complication ([place])    TYPE I  . Hypothyroidism   . Seizures ([place])     Review of systems:      Physical Exam    Heart and lungs: Regular rate and rhythm without rub or gallop, lungs are bilaterally clear.    HEENT: Normocephalic atraumatic eyes are anicteric    Other:     Pertinant exam for procedure: Soft mild generalized discomfort no masses or rebound. Bowel sounds  positive normoactive.    Planned proceedures: Colonoscopy and indicated procedures. I have discussed the risks benefits and complications of procedures to include not limited to bleeding, infection, perforation and the risk of sedation and the patient wishes to proceed.    Mandy Sails, MD Gastroenterology 01/02/2016  8:35 AM

## 2016-01-02 NOTE — Op Note (Signed)
[facility] Gastroenterology Patient Name: [name] Procedure Date: 01/02/2016 8:39 AM MRN: [id number] Account #: [id number] Date of Birth: [birth year] Admit Type: Outpatient Age: 35 Room: [facility] ENDO ROOM 4 Gender: Female Note Status: Finalized Procedure:            Colonoscopy Indications:          Chronic diarrhea, Weight loss Providers:            Lollie Sails, MD Referring MD:         Arnoldo Morale (Referring MD) Medicines:            Monitored Anesthesia Care Complications:        No immediate complications. Procedure:            Pre-Anesthesia Assessment:                       - ASA Grade Assessment: III - A patient with severe                        systemic disease.                       After obtaining informed consent, the colonoscope was                        passed under direct vision. Throughout the procedure,                        the patient's blood pressure, pulse, and oxygen                        saturations were monitored continuously. The                        Colonoscope was introduced through the anus and                        advanced to the the cecum, identified by appendiceal                        orifice and ileocecal valve. The colonoscopy was                        performed with moderate difficulty due to poor bowel                        prep and a tortuous colon. Successful completion of the                        procedure was aided by lavage. Findings:      The colon (entire examined portion) appeared normal. Biopsies for       histology were taken with a cold forceps from the right colon and left       colon for evaluation of microscopic colitis.      The digital rectal exam was normal. Impression:           - The entire examined colon is normal. Biopsied. Recommendation:       - Discharge patient to home.                       -  Continue present medications.                       - Await pathology results.                   - Return to GI clinic in 3 weeks. Procedure Code(s):    --- Professional ---                       [phone], Colonoscopy, flexible; with biopsy, single or                        multiple Diagnosis Code(s):    --- Professional ---                       K52.9, Noninfective gastroenteritis and colitis,                        unspecified                       R63.4, Abnormal weight loss CPT copyright 2016 American Medical [employer]. All rights reserved. The codes documented in this report are preliminary and upon coder review may  be revised to meet current compliance requirements. Lollie Sails, MD 01/02/2016 9:15:09 AM This report has been signed electronically. Number of Addenda: 0 Note Initiated On: 01/02/2016 8:39 AM Scope Withdrawal Time: 0 hours 6 minutes 36 seconds  Total Procedure Duration: 0 hours 20 minutes 0 seconds       [facility]

## 2016-01-02 NOTE — Anesthesia Procedure Notes (Signed)
Date/Time: 01/02/2016 8:56 AM Performed by: Nelda Marseille Pre-anesthesia Checklist: Patient identified, Emergency Drugs available, Suction available, Patient being monitored and Timeout performed Oxygen Delivery Method: Nasal cannula

## 2016-01-02 NOTE — Anesthesia Preprocedure Evaluation (Signed)
Anesthesia Evaluation  Patient identified by MRN, date of birth, ID band Patient awake    Reviewed: Allergy & Precautions, NPO status , Patient's Chart, lab work & pertinent test results  History of Anesthesia Complications Negative for: history of anesthetic complications  Airway Mallampati: II  TM Distance: >3 FB Neck ROM: Full    Dental no notable dental hx.    Pulmonary neg sleep apnea, neg COPD, former smoker,    breath sounds clear to auscultation- rhonchi (-) wheezing      Cardiovascular Exercise Tolerance: Good (-) hypertension(-) angina(-) CAD  Rhythm:Regular Rate:Normal     Neuro/Psych Seizures: single seizure in the past, unknown etiology, never on antiepileptics.  Anxiety    GI/Hepatic negative GI ROS, Neg liver ROS,   Endo/Other  diabetes (uses insulin pump), Type 1, Insulin DependentHypothyroidism   Renal/GU negative Renal ROS     Musculoskeletal   Abdominal (+) - obese,   Peds  Hematology negative hematology ROS (+)   Anesthesia Other Findings Past Medical History: No date: Anxiety No date: Diabetes mellitus without complication ([facility])     Comment: TYPE I No date: Hypothyroidism No date: Seizures ([place])   Reproductive/Obstetrics                             Anesthesia Physical Anesthesia Plan  ASA: II  Anesthesia Plan: General   Post-op Pain Management:    Induction: Intravenous  Airway Management Planned: Natural Airway  Additional Equipment:   Intra-op Plan:   Post-operative Plan:   Informed Consent: I have reviewed the patients History and Physical, chart, labs and discussed the procedure including the risks, benefits and alternatives for the proposed anesthesia with the patient or authorized representative who has indicated his/her understanding and acceptance.   Dental advisory given  Plan Discussed with: CRNA and Anesthesiologist  Anesthesia Plan  Comments:         Anesthesia Quick Evaluation

## 2016-01-02 NOTE — Transfer of Care (Signed)
Immediate Anesthesia Transfer of Care Note  Patient: [name]  Procedure(s) Performed: Procedure(s): COLONOSCOPY WITH PROPOFOL (N/A)  Patient Location: PACU  Anesthesia Type:General  Level of Consciousness: sedated  Airway & Oxygen Therapy: Patient Spontanous Breathing and Patient connected to nasal cannula oxygen  Post-op Assessment: Report given to RN and Post -op Vital signs reviewed and stable  Post vital signs: Reviewed and stable  Last Vitals:  Vitals:   01/02/16 0757  BP: 105/69  Pulse: 95  Resp: 16  Temp: 36.5 C    Last Pain:  Vitals:   01/02/16 0757  TempSrc: Tympanic  PainSc: 7       Patients Stated Pain Goal: 4 ([phone] [id number])  Complications: No apparent anesthesia complications

## 2016-01-05 ENCOUNTER — Encounter: Payer: Self-pay | Admitting: Gastroenterology

## 2016-01-05 NOTE — Progress Notes (Signed)
Pt c/o diarrhea and abdominal cramps.Instructed to call MD this am .slight temperature over w/e. Is feeling better.

## 2016-01-06 LAB — SURGICAL PATHOLOGY

## 2016-01-09 ENCOUNTER — Emergency Department (HOSPITAL_COMMUNITY)
Admission: EM | Admit: 2016-01-09 | Discharge: 2016-01-09 | Disposition: A | Discharge status: Home / Self Care | Payer: Medicaid Other | Attending: Physician Assistant | Admitting: Physician Assistant

## 2016-01-09 ENCOUNTER — Encounter (HOSPITAL_COMMUNITY): Payer: Self-pay | Admitting: Emergency Medicine

## 2016-01-09 DIAGNOSIS — Z87891 Personal history of nicotine dependence: Secondary | ICD-10-CM | POA: Diagnosis not present

## 2016-01-09 DIAGNOSIS — Z91040 Latex allergy status: Secondary | ICD-10-CM | POA: Insufficient documentation

## 2016-01-09 DIAGNOSIS — N12 Tubulo-interstitial nephritis, not specified as acute or chronic: Secondary | ICD-10-CM | POA: Insufficient documentation

## 2016-01-09 DIAGNOSIS — E1040 Type 1 diabetes mellitus with diabetic neuropathy, unspecified: Secondary | ICD-10-CM | POA: Diagnosis not present

## 2016-01-09 DIAGNOSIS — N3000 Acute cystitis without hematuria: Secondary | ICD-10-CM | POA: Diagnosis not present

## 2016-01-09 DIAGNOSIS — Z79899 Other long term (current) drug therapy: Secondary | ICD-10-CM | POA: Insufficient documentation

## 2016-01-09 DIAGNOSIS — E039 Hypothyroidism, unspecified: Secondary | ICD-10-CM | POA: Diagnosis not present

## 2016-01-09 DIAGNOSIS — R1084 Generalized abdominal pain: Secondary | ICD-10-CM | POA: Diagnosis present

## 2016-01-09 LAB — URINALYSIS, ROUTINE W REFLEX MICROSCOPIC
BILIRUBIN URINE: NEGATIVE
GLUCOSE, UA: 500 mg/dL — AB
Ketones, ur: NEGATIVE mg/dL
Nitrite: NEGATIVE
PH: 8.5 — AB (ref 5.0–8.0)
Protein, ur: 100 mg/dL — AB
SPECIFIC GRAVITY, URINE: 1.029 (ref 1.005–1.030)

## 2016-01-09 LAB — BLOOD GAS, VENOUS
Acid-base deficit: 0.8 mmol/L (ref 0.0–2.0)
Bicarbonate: 24.8 mmol/L (ref 20.0–28.0)
O2 Saturation: 30.3 %
PATIENT TEMPERATURE: 98.6
pCO2, Ven: 46.9 mmHg (ref 44.0–60.0)
pH, Ven: 7.343 (ref 7.250–7.430)

## 2016-01-09 LAB — CBC
HEMATOCRIT: 40.3 % (ref 36.0–46.0)
HEMOGLOBIN: 13.4 g/dL (ref 12.0–15.0)
MCH: 29.3 pg (ref 26.0–34.0)
MCHC: 33.3 g/dL (ref 30.0–36.0)
MCV: 88 fL (ref 78.0–100.0)
Platelets: 265 10*3/uL (ref 150–400)
RBC: 4.58 MIL/uL (ref 3.87–5.11)
RDW: 16.1 % — ABNORMAL HIGH (ref 11.5–15.5)
WBC: 7.3 10*3/uL (ref 4.0–10.5)

## 2016-01-09 LAB — COMPREHENSIVE METABOLIC PANEL
ALBUMIN: 5.3 g/dL — AB (ref 3.5–5.0)
ALK PHOS: 305 U/L — AB (ref 38–126)
ALT: 158 U/L — ABNORMAL HIGH (ref 14–54)
AST: 275 U/L — ABNORMAL HIGH (ref 15–41)
Anion gap: 12 (ref 5–15)
BUN: 22 mg/dL — ABNORMAL HIGH (ref 6–20)
CALCIUM: 9.8 mg/dL (ref 8.9–10.3)
CHLORIDE: 97 mmol/L — AB (ref 101–111)
CO2: 24 mmol/L (ref 22–32)
Creatinine, Ser: 0.75 mg/dL (ref 0.44–1.00)
GFR calc Af Amer: >60 mL/min (ref >60)
GFR calc non Af Amer: >60 mL/min (ref >60)
GLUCOSE: 251 mg/dL — AB (ref 65–99)
POTASSIUM: 3.6 mmol/L (ref 3.5–5.1)
SODIUM: 133 mmol/L — AB (ref 135–145)
Total Bilirubin: 0.9 mg/dL (ref 0.3–1.2)
Total Protein: 9.3 g/dL — ABNORMAL HIGH (ref 6.5–8.1)

## 2016-01-09 LAB — CBG MONITORING, ED: Glucose-Capillary: 273 mg/dL — ABNORMAL HIGH (ref 65–99)

## 2016-01-09 LAB — URINE MICROSCOPIC-ADD ON

## 2016-01-09 LAB — LIPASE, BLOOD: Lipase: 15 U/L (ref 11–51)

## 2016-01-09 MED ORDER — DEXTROSE 5 % IV SOLN
1.0000 g | Freq: Once | INTRAVENOUS | Status: AC
  Administered 2016-01-09: 1 g via INTRAVENOUS
  Filled 2016-01-09: qty 10

## 2016-01-09 MED ORDER — HYDROMORPHONE HCL 1 MG/ML IJ SOLN
1.0000 mg | Freq: Once | INTRAMUSCULAR | Status: AC
  Administered 2016-01-09: 1 mg via INTRAVENOUS
  Filled 2016-01-09: qty 1

## 2016-01-09 MED ORDER — CEPHALEXIN 500 MG PO CAPS: 500.0000 mg | ORAL_CAPSULE | Freq: Four times a day (QID) | ORAL | 0 refills | Status: DC

## 2016-01-09 MED ORDER — SODIUM CHLORIDE 0.9 % IV BOLUS (SEPSIS)
1000.0000 mL | Freq: Once | INTRAVENOUS | Status: AC
  Administered 2016-01-09: 1000 mL via INTRAVENOUS

## 2016-01-09 MED ORDER — PROCHLORPERAZINE EDISYLATE 5 MG/ML IJ SOLN
10.0000 mg | Freq: Once | INTRAMUSCULAR | Status: AC
  Administered 2016-01-09: 10 mg via INTRAVENOUS
  Filled 2016-01-09: qty 2

## 2016-01-09 NOTE — ED Triage Notes (Signed)
Per pt, states she is Type 1 diabetic-states her ketones are elevated-states dysuria-abdominal pain

## 2016-01-09 NOTE — ED Provider Notes (Signed)
[facility] DEPT Provider Note   CSN: [id number] Arrival date & time: 01/09/16  1349     History   Chief Complaint Chief Complaint  Patient presents with  . Abdominal Pain    HPI [name] is a 35 y.o. female.   Abdominal Pain   This is a recurrent problem. Episode onset: 4-5 days, but worsened last night. The problem occurs constantly. The problem has been rapidly worsening. The pain is located in the generalized abdominal region (radiating to lower back). The quality of the pain is cramping and sharp. The pain is moderate. Associated symptoms include fever (100.3 at home), diarrhea (chronic), nausea and vomiting. Pertinent negatives include hematochezia, melena, dysuria, hematuria and arthralgias. The symptoms are relieved by certain positions. Past medical history comments: DM with prior DKA. Prior recurrent UTI.    Past Medical History:  Diagnosis Date  . Anxiety   . Diabetes mellitus without complication ([place])    TYPE I  . Hypothyroidism   . Seizures [facility])     Patient Active Problem List   Diagnosis Date Noted  . Chronic diarrhea 10/15/2015  . Volume depletion 10/15/2015  . Sinus tachycardia 10/15/2015  . Restless leg syndrome 07/11/2015  . Diabetic neuropathy ([place]) 07/11/2015  . DKA (diabetic ketoacidoses) ([place]) 07/05/2015  . Anxiety state 05/26/2015  . Hyperglycemia due to type 1 diabetes mellitus ([place]) 05/03/2015  . Nausea vomiting and diarrhea 05/03/2015  . Insomnia 04/01/2015  . Malnutrition of moderate degree 03/12/2015  . DM type 1 (diabetes mellitus, type 1) ([place]) 03/11/2015  . Hyperglycemic hyperosmolar nonketotic coma ([place]) 03/11/2015  . Diarrhea due to malabsorption 03/11/2015  . Hyperglycemia 03/11/2015  . Hyperkalemia 01/11/2015  . Exocrine pancreatic insufficiency 07/30/2014  . Adjustment disorder with mixed anxiety and depressed mood 07/27/2014  . Personal history of noncompliance with medical treatment 06/22/2014  . Protein-calorie  malnutrition, severe ([place]) 05/31/2014  . Diabetic gastroparesis associated with type 1 diabetes mellitus ([place]) 03/03/2014    Past Surgical History:  Procedure Laterality Date  . BACK SURGERY     age 10  . COLONOSCOPY WITH PROPOFOL N/A 12/02/2015   Procedure: COLONOSCOPY WITH PROPOFOL;  Surgeon: Lollie Sails, MD;  Location: [facility] ENDOSCOPY;  Service: Endoscopy;  Laterality: N/A;  . COLONOSCOPY WITH PROPOFOL N/A 01/02/2016   Procedure: COLONOSCOPY WITH PROPOFOL;  Surgeon: Lollie Sails, MD;  Location: [facility] ENDOSCOPY;  Service: Endoscopy;  Laterality: N/A;  . ESOPHAGOGASTRODUODENOSCOPY (EGD) WITH PROPOFOL N/A 06/27/2014   Procedure: ESOPHAGOGASTRODUODENOSCOPY (EGD) WITH PROPOFOL;  Surgeon: Milus Banister, MD;  Location: [facility] ENDOSCOPY;  Service: Endoscopy;  Laterality: N/A;  . FLEXIBLE SIGMOIDOSCOPY N/A 06/24/2014   Procedure: FLEXIBLE SIGMOIDOSCOPY;  Surgeon: Milus Banister, MD;  Location: [facility] ENDOSCOPY;  Service: Endoscopy;  Laterality: N/A;  . LAPAROSCOPY  02/2002, 06/2010   laparoscopy with lysis pelvic adhesions for pelvic pain  . ROOT CANAL  10/10/12  . TOOTH EXTRACTION      OB History    No data available       Home Medications    Prior to Admission medications   Medication Sig Start Date End Date Taking? Authorizing Provider  acetaminophen (TYLENOL) 500 MG tablet Take 500-1,000 mg by mouth every 6 (six) hours as needed for mild pain, moderate pain or headache.    Yes Historical Provider, MD  ALPRAZolam Duanne Moron) 1 MG tablet Take 2 mg by mouth at bedtime.   Yes Historical Provider, MD  amitriptyline (ELAVIL) 50 MG tablet Take 100 mg by mouth at bedtime.  Yes Historical Provider, MD  calcium carbonate (OSCAL) 1500 (600 Ca) MG TABS tablet Take 1,200 mg of elemental calcium by mouth daily with breakfast.   Yes Historical Provider, MD  cholestyramine (QUESTRAN) 4 g packet Take 4 g by mouth 4 (four) times daily.   Yes Historical Provider, MD  CREON 36000 UNITS CPEP capsule Take  144,000-216,000 Units by mouth 5 (five) times daily. Pt takes six capsules three times daily with meals and four capsules two times daily with snacks.   Yes Historical Provider, MD  cyclobenzaprine (FLEXERIL) 10 MG tablet Take 10 mg by mouth 3 (three) times daily as needed for muscle spasms.    Yes Historical Provider, MD  dicyclomine (BENTYL) 10 MG capsule Take 10 mg by mouth 3 (three) times daily as needed for spasms.    Yes Historical Provider, MD  diphenoxylate-atropine (LOMOTIL) 2.5-0.025 MG per tablet Take 1 tablet by mouth 4 (four) times daily as needed for diarrhea or loose stools. 11/08/14  Yes Orpah Greek, MD  feeding supplement, GLUCERNA SHAKE, (GLUCERNA SHAKE) LIQD Take 237 mLs by mouth 6 (six) times daily.   Yes Historical Provider, MD  gabapentin (NEURONTIN) 300 MG capsule Take 300 mg by mouth 3 (three) times daily.   Yes Historical Provider, MD  hydrOXYzine (ATARAX/VISTARIL) 25 MG tablet Take 25 mg by mouth 3 (three) times daily as needed for anxiety.   Yes Historical Provider, MD  hyoscyamine (LEVSIN SL) 0.125 MG SL tablet Place 0.125 mg under the tongue every 4 (four) hours as needed for cramping or pain.   Yes Historical Provider, MD  Insulin Human (INSULIN PUMP) SOLN Inject 1 each into the skin continuous. Pt uses Humalog -- 10 units per day and 0-5 with meals   Yes Historical Provider, MD  insulin lispro (HUMALOG) 100 UNIT/ML injection Inject 0-10 Units into the skin continuous. Use with insulin pump-- 10 units a day and 0-5 with meals   Yes Historical Provider, MD  Multiple Vitamin (MULTIVITAMIN WITH MINERALS) TABS Take 2 tablets by mouth daily.    Yes Historical Provider, MD  omeprazole (PRILOSEC) 40 MG capsule Take 40 mg by mouth daily.    Yes Historical Provider, MD  oxyCODONE-acetaminophen (PERCOCET) 7.5-325 MG tablet Take 1 tablet by mouth every 4 (four) hours as needed for severe pain.    Yes Historical Provider, MD  Probiotic Product (DIGESTIVE ADVANTAGE GUMMIES PO)  Take 2 each by mouth 2 (two) times daily.    Yes Historical Provider, MD  promethazine (PHENERGAN) 25 MG suppository Place 25 mg rectally every 6 (six) hours as needed for nausea or vomiting.   Yes Historical Provider, MD  promethazine (PHENERGAN) 25 MG tablet TAKE 1 TABLET (25 MG TOTAL) BY MOUTH EVERY 6 (SIX) HOURS AS NEEDED FOR NAUSEA OR VOMITING. 12/18/15  Yes Arnoldo Morale, MD  saccharomyces boulardii (FLORASTOR) 250 MG capsule Take 1 capsule (250 mg total) by mouth 2 (two) times daily. 07/06/14  Yes Thurnell Lose, MD  traZODone (DESYREL) 50 MG tablet TAKE 1 TABLET BY MOUTH AT BEDTIME AS NEEDED FOR SLEEP. 11/20/15  Yes Arnoldo Morale, MD  vitamin B-12 (CYANOCOBALAMIN) 1000 MCG tablet Take 1,000 mcg by mouth 2 (two) times daily.   Yes Historical Provider, MD  glucagon (GLUCAGON EMERGENCY) 1 MG injection Inject 1 mg into the vein once as needed (for severe hypoglycemia).    Historical Provider, MD  ondansetron (ZOFRAN ODT) 4 MG disintegrating tablet Take 1 tablet (4 mg total) by mouth every 8 (eight) hours as needed  for nausea or vomiting. Patient not taking: Reported on 01/09/2016 11/02/15   Carlos Levering, PA-C    Family History Family History  Problem Relation Age of Onset  . Diabetes Maternal Grandmother   . Cancer Maternal Grandmother     Leukemia  . Cancer Maternal Grandfather     Small cell lung cancer    Social History Social History  Substance Use Topics  . Smoking status: Former Smoker    Quit date: 04/19/2000  . Smokeless tobacco: Former Systems developer  . Alcohol use No     Comment: RARE SIPS OF WINE     Allergies   Morphine; Scallops [shellfish allergy]; Betamethasone; Diphenhydramine hcl; Haloperidol and related; Ketorolac; Latex; and Other   Review of Systems Review of Systems  Constitutional: Positive for fever (100.3 at home). Negative for chills.  HENT: Negative for ear pain and sore throat.   Eyes: Negative for pain and visual disturbance.  Respiratory: Negative  for cough and shortness of breath.   Cardiovascular: Negative for chest pain and palpitations.  Gastrointestinal: Positive for abdominal pain, diarrhea (chronic), nausea and vomiting. Negative for hematochezia and melena.  Genitourinary: Negative for dysuria and hematuria.  Musculoskeletal: Negative for arthralgias and back pain.  Skin: Negative for color change and rash.  Neurological: Negative for seizures and syncope.  All other systems reviewed and are negative.    Physical Exam Updated Vital Signs BP 119/87   Pulse (!) 138   Temp 98.3 F (36.8 C)   Resp 20   SpO2 100%   Physical Exam  Constitutional: She is oriented to person, place, and time. She appears well-developed and well-nourished. No distress.  HENT:  Head: Normocephalic and atraumatic.  Nose: Nose normal.  Eyes: Conjunctivae and EOM are normal. Pupils are equal, round, and reactive to light. Right eye exhibits no discharge. Left eye exhibits no discharge. No scleral icterus.  Neck: Normal range of motion. Neck supple.  Cardiovascular: Normal rate and regular rhythm.  Exam reveals no gallop and no friction rub.   No murmur heard. Pulmonary/Chest: Effort normal and breath sounds normal. No stridor. No respiratory distress. She has no rales.  Abdominal: Soft. She exhibits no distension. There is generalized tenderness. There is CVA tenderness. There is no rigidity, no rebound and no guarding.  Musculoskeletal: She exhibits no edema or tenderness.  Neurological: She is alert and oriented to person, place, and time.  Skin: Skin is warm and dry. No rash noted. She is not diaphoretic. No erythema.  Psychiatric: She has a normal mood and affect.  Vitals reviewed.    ED Treatments / Results  Labs (all labs ordered are listed, but only abnormal results are displayed) Labs Reviewed  CBC - Abnormal; Notable for the following:       Result Value   RDW 16.1 (*)    All other components within normal limits  URINALYSIS,  ROUTINE W REFLEX MICROSCOPIC (NOT AT [facility]) - Abnormal; Notable for the following:    APPearance TURBID (*)    pH 8.5 (*)    Glucose, UA 500 (*)    Hgb urine dipstick SMALL (*)    Protein, ur 100 (*)    Leukocytes, UA MODERATE (*)    All other components within normal limits  URINE MICROSCOPIC-ADD ON - Abnormal; Notable for the following:    Squamous Epithelial / LPF 0-5 (*)    Bacteria, UA MANY (*)    All other components within normal limits  CBG MONITORING, ED - Abnormal; Notable  for the following:    Glucose-Capillary 273 (*)    All other components within normal limits  BLOOD GAS, VENOUS  LIPASE, BLOOD  COMPREHENSIVE METABOLIC PANEL    EKG  EKG Interpretation None       Radiology No results found.  Procedures Procedures (including critical care time)  Medications Ordered in ED Medications  cefTRIAXone (ROCEPHIN) 1 g in dextrose 5 % 50 mL IVPB (1 g Intravenous New Bag/Given 01/09/16 1547)  HYDROmorphone (DILAUDID) injection 1 mg (1 mg Intravenous Given 01/09/16 1539)  sodium chloride 0.9 % bolus 1,000 mL (1,000 mLs Intravenous New Bag/Given 01/09/16 1539)  prochlorperazine (COMPAZINE) injection 10 mg (10 mg Intravenous Given 01/09/16 1539)     Initial Impression / Assessment and Plan / ED Course  I have reviewed the triage vital signs and the nursing notes.  Pertinent labs & imaging results that were available during my care of the patient were reviewed by me and considered in my medical decision making (see chart for details).  Clinical Course    Treatment symptomatically with IV fluids, nausea medicine, pain medicine.  Evidence of urinary tract infection, with the abdominal pain and CVA tenderness concerning for pyelonephritis. Patient given 1 g of Rocephin. CBC without leukocytosis. VBG without acidosis. UA without ketones. Currently pending CMP and lipase.  His labs unremarkable and patient is tolerating by mouth she would be appropriate for discharge home  with Keflex for pyelonephritis.  Discussed this with the patient who is in agreement.  Patient care turned over to Dr Thomasene Lot at 1600. Patient case and results discussed in detail; please see their note for further ED managment.      Final Clinical Impressions(s) / ED Diagnoses   Final diagnoses:  Pyelonephritis      Fatima Blank, MD 01/09/16 [phone]

## 2016-01-09 NOTE — Discharge Instructions (Signed)
Please take abx and return to PCP.

## 2016-01-18 ENCOUNTER — Observation Stay (HOSPITAL_COMMUNITY)
Admission: EM | Admit: 2016-01-18 | Discharge: 2016-01-19 | Disposition: A | Discharge status: Home / Self Care | Payer: Medicaid Other | Attending: Internal Medicine | Admitting: Internal Medicine

## 2016-01-18 ENCOUNTER — Encounter (HOSPITAL_COMMUNITY): Payer: Self-pay

## 2016-01-18 DIAGNOSIS — K3184 Gastroparesis: Secondary | ICD-10-CM | POA: Insufficient documentation

## 2016-01-18 DIAGNOSIS — R739 Hyperglycemia, unspecified: Secondary | ICD-10-CM | POA: Diagnosis present

## 2016-01-18 DIAGNOSIS — F419 Anxiety disorder, unspecified: Secondary | ICD-10-CM | POA: Diagnosis not present

## 2016-01-18 DIAGNOSIS — Z9641 Presence of insulin pump (external) (internal): Secondary | ICD-10-CM | POA: Insufficient documentation

## 2016-01-18 DIAGNOSIS — K909 Intestinal malabsorption, unspecified: Secondary | ICD-10-CM | POA: Diagnosis present

## 2016-01-18 DIAGNOSIS — G894 Chronic pain syndrome: Secondary | ICD-10-CM | POA: Diagnosis not present

## 2016-01-18 DIAGNOSIS — Z79899 Other long term (current) drug therapy: Secondary | ICD-10-CM | POA: Diagnosis not present

## 2016-01-18 DIAGNOSIS — R109 Unspecified abdominal pain: Secondary | ICD-10-CM | POA: Diagnosis present

## 2016-01-18 DIAGNOSIS — E1043 Type 1 diabetes mellitus with diabetic autonomic (poly)neuropathy: Secondary | ICD-10-CM | POA: Diagnosis not present

## 2016-01-18 DIAGNOSIS — R945 Abnormal results of liver function studies: Secondary | ICD-10-CM

## 2016-01-18 DIAGNOSIS — K529 Noninfective gastroenteritis and colitis, unspecified: Secondary | ICD-10-CM | POA: Diagnosis present

## 2016-01-18 DIAGNOSIS — R000 Tachycardia, unspecified: Secondary | ICD-10-CM | POA: Insufficient documentation

## 2016-01-18 DIAGNOSIS — Z87891 Personal history of nicotine dependence: Secondary | ICD-10-CM | POA: Insufficient documentation

## 2016-01-18 DIAGNOSIS — Z794 Long term (current) use of insulin: Secondary | ICD-10-CM | POA: Insufficient documentation

## 2016-01-18 DIAGNOSIS — R7989 Other specified abnormal findings of blood chemistry: Secondary | ICD-10-CM | POA: Insufficient documentation

## 2016-01-18 DIAGNOSIS — E1065 Type 1 diabetes mellitus with hyperglycemia: Principal | ICD-10-CM | POA: Insufficient documentation

## 2016-01-18 DIAGNOSIS — F411 Generalized anxiety disorder: Secondary | ICD-10-CM | POA: Diagnosis present

## 2016-01-18 DIAGNOSIS — R112 Nausea with vomiting, unspecified: Secondary | ICD-10-CM | POA: Diagnosis present

## 2016-01-18 DIAGNOSIS — R197 Diarrhea, unspecified: Secondary | ICD-10-CM

## 2016-01-18 LAB — URINALYSIS, ROUTINE W REFLEX MICROSCOPIC
BILIRUBIN URINE: NEGATIVE
Glucose, UA: >1000 mg/dL — AB
HGB URINE DIPSTICK: NEGATIVE
KETONES UR: 40 mg/dL — AB
Leukocytes, UA: NEGATIVE
NITRITE: NEGATIVE
PROTEIN: NEGATIVE mg/dL
Specific Gravity, Urine: 1.027 (ref 1.005–1.030)
pH: 6 (ref 5.0–8.0)

## 2016-01-18 LAB — BLOOD GAS, VENOUS
Acid-base deficit: 4.7 mmol/L — ABNORMAL HIGH (ref 0.0–2.0)
Bicarbonate: 18.9 mmol/L — ABNORMAL LOW (ref 20.0–28.0)
O2 SAT: 70.1 %
PATIENT TEMPERATURE: 98.6
PH VEN: 7.381 (ref 7.250–7.430)
pCO2, Ven: 32.6 mmHg — ABNORMAL LOW (ref 44.0–60.0)
pO2, Ven: 38 mmHg (ref 32.0–45.0)

## 2016-01-18 LAB — BASIC METABOLIC PANEL
ANION GAP: 7 (ref 5–15)
BUN: 9 mg/dL (ref 6–20)
CALCIUM: 7.9 mg/dL — AB (ref 8.9–10.3)
CO2: 21 mmol/L — AB (ref 22–32)
Chloride: 107 mmol/L (ref 101–111)
Creatinine, Ser: 0.65 mg/dL (ref 0.44–1.00)
GFR calc Af Amer: >60 mL/min (ref >60)
GFR calc non Af Amer: >60 mL/min (ref >60)
GLUCOSE: 313 mg/dL — AB (ref 65–99)
Potassium: 3.7 mmol/L (ref 3.5–5.1)
Sodium: 135 mmol/L (ref 135–145)

## 2016-01-18 LAB — COMPREHENSIVE METABOLIC PANEL
ALBUMIN: 4.2 g/dL (ref 3.5–5.0)
ALK PHOS: 181 U/L — AB (ref 38–126)
ALT: 85 U/L — ABNORMAL HIGH (ref 14–54)
ANION GAP: 13 (ref 5–15)
AST: 142 U/L — ABNORMAL HIGH (ref 15–41)
BUN: 14 mg/dL (ref 6–20)
CALCIUM: 8.6 mg/dL — AB (ref 8.9–10.3)
CO2: 21 mmol/L — AB (ref 22–32)
Chloride: 96 mmol/L — ABNORMAL LOW (ref 101–111)
Creatinine, Ser: 0.84 mg/dL (ref 0.44–1.00)
GFR calc non Af Amer: >60 mL/min (ref >60)
GLUCOSE: 514 mg/dL — AB (ref 65–99)
POTASSIUM: 4.1 mmol/L (ref 3.5–5.1)
SODIUM: 130 mmol/L — AB (ref 135–145)
TOTAL PROTEIN: 7.6 g/dL (ref 6.5–8.1)
Total Bilirubin: 1.2 mg/dL (ref 0.3–1.2)

## 2016-01-18 LAB — CBC
HCT: 35.6 % — ABNORMAL LOW (ref 36.0–46.0)
HEMOGLOBIN: 11.9 g/dL — AB (ref 12.0–15.0)
MCH: 30.1 pg (ref 26.0–34.0)
MCHC: 33.4 g/dL (ref 30.0–36.0)
MCV: 90.1 fL (ref 78.0–100.0)
PLATELETS: 180 10*3/uL (ref 150–400)
RBC: 3.95 MIL/uL (ref 3.87–5.11)
RDW: 15.4 % (ref 11.5–15.5)
WBC: 7 10*3/uL (ref 4.0–10.5)

## 2016-01-18 LAB — RAPID URINE DRUG SCREEN, HOSP PERFORMED
AMPHETAMINES: NOT DETECTED
BARBITURATES: NOT DETECTED
BENZODIAZEPINES: POSITIVE — AB
Cocaine: NOT DETECTED
Opiates: POSITIVE — AB
Tetrahydrocannabinol: NOT DETECTED

## 2016-01-18 LAB — LIPASE, BLOOD: LIPASE: 14 U/L (ref 11–51)

## 2016-01-18 LAB — CBG MONITORING, ED
GLUCOSE-CAPILLARY: 524 mg/dL — AB (ref 65–99)
GLUCOSE-CAPILLARY: 471 mg/dL — AB (ref 65–99)
GLUCOSE-CAPILLARY: 447 mg/dL — AB (ref 65–99)
Glucose-Capillary: 466 mg/dL — ABNORMAL HIGH (ref 65–99)

## 2016-01-18 LAB — URINE MICROSCOPIC-ADD ON

## 2016-01-18 LAB — POC URINE PREG, ED: Preg Test, Ur: NEGATIVE

## 2016-01-18 LAB — GLUCOSE, CAPILLARY: GLUCOSE-CAPILLARY: 272 mg/dL — AB (ref 65–99)

## 2016-01-18 MED ORDER — INSULIN ASPART 100 UNIT/ML ~~LOC~~ SOLN: 0.0000 [IU] | SUBCUTANEOUS | Status: DC

## 2016-01-18 MED ORDER — HYDROXYZINE HCL 25 MG PO TABS: 25.0000 mg | ORAL_TABLET | Freq: Three times a day (TID) | ORAL | Status: DC | PRN

## 2016-01-18 MED ORDER — POTASSIUM CHLORIDE 2 MEQ/ML IV SOLN
INTRAVENOUS | Status: DC
  Administered 2016-01-18: 22:00:00 via INTRAVENOUS
  Filled 2016-01-18 (×4): qty 1000

## 2016-01-18 MED ORDER — OXYCODONE-ACETAMINOPHEN 5-325 MG PO TABS
1.0000 | ORAL_TABLET | ORAL | Status: DC | PRN
  Administered 2016-01-19 (×2): 1 via ORAL
  Filled 2016-01-18 (×4): qty 1

## 2016-01-18 MED ORDER — SODIUM CHLORIDE 0.9 % IV SOLN
Freq: Once | INTRAVENOUS | Status: AC
  Administered 2016-01-18: 19:00:00 via INTRAVENOUS

## 2016-01-18 MED ORDER — ONDANSETRON HCL 4 MG/2ML IJ SOLN: 4.0000 mg | Freq: Four times a day (QID) | INTRAMUSCULAR | Status: DC | PRN

## 2016-01-18 MED ORDER — ENOXAPARIN SODIUM 40 MG/0.4ML ~~LOC~~ SOLN
40.0000 mg | Freq: Every day | SUBCUTANEOUS | Status: DC
  Administered 2016-01-18: 40 mg via SUBCUTANEOUS
  Filled 2016-01-18: qty 0.4

## 2016-01-18 MED ORDER — GABAPENTIN 300 MG PO CAPS
300.0000 mg | ORAL_CAPSULE | Freq: Three times a day (TID) | ORAL | Status: DC
Start: 2016-01-18 — End: 2016-01-19
  Administered 2016-01-18 – 2016-01-19 (×2): 300 mg via ORAL
  Filled 2016-01-18 (×2): qty 1

## 2016-01-18 MED ORDER — ONDANSETRON HCL 4 MG/2ML IJ SOLN
4.0000 mg | Freq: Once | INTRAMUSCULAR | Status: AC
  Administered 2016-01-18: 4 mg via INTRAVENOUS
  Filled 2016-01-18: qty 2

## 2016-01-18 MED ORDER — CYCLOBENZAPRINE HCL 10 MG PO TABS
10.0000 mg | ORAL_TABLET | Freq: Two times a day (BID) | ORAL | Status: DC
  Administered 2016-01-18 – 2016-01-19 (×2): 10 mg via ORAL
  Filled 2016-01-18 (×2): qty 1

## 2016-01-18 MED ORDER — HYDROMORPHONE HCL 1 MG/ML IJ SOLN
1.0000 mg | Freq: Once | INTRAMUSCULAR | Status: AC
  Administered 2016-01-18: 1 mg via INTRAVENOUS
  Filled 2016-01-18: qty 1

## 2016-01-18 MED ORDER — ALPRAZOLAM 1 MG PO TABS
2.0000 mg | ORAL_TABLET | Freq: Every day | ORAL | Status: DC
  Administered 2016-01-18: 2 mg via ORAL
  Filled 2016-01-18: qty 2

## 2016-01-18 MED ORDER — SODIUM CHLORIDE 0.9 % IV SOLN
INTRAVENOUS | Status: DC
  Filled 2016-01-18: qty 2.5

## 2016-01-18 MED ORDER — HYOSCYAMINE SULFATE 0.125 MG SL SUBL
0.1250 mg | SUBLINGUAL_TABLET | SUBLINGUAL | Status: DC | PRN
Start: 2016-01-18 — End: 2016-01-19
  Filled 2016-01-18: qty 1

## 2016-01-18 MED ORDER — PANTOPRAZOLE SODIUM 40 MG PO TBEC
80.0000 mg | DELAYED_RELEASE_TABLET | Freq: Every day | ORAL | Status: DC
  Administered 2016-01-18 – 2016-01-19 (×2): 80 mg via ORAL
  Filled 2016-01-18 (×3): qty 2

## 2016-01-18 MED ORDER — TRAZODONE HCL 50 MG PO TABS
50.0000 mg | ORAL_TABLET | Freq: Every day | ORAL | Status: DC
  Administered 2016-01-18: 50 mg via ORAL
  Filled 2016-01-18: qty 1

## 2016-01-18 MED ORDER — SODIUM CHLORIDE 0.9 % IV SOLN
Freq: Once | INTRAVENOUS | Status: AC
  Administered 2016-01-18: 1000 mL via INTRAVENOUS

## 2016-01-18 MED ORDER — SACCHAROMYCES BOULARDII 250 MG PO CAPS
250.0000 mg | ORAL_CAPSULE | Freq: Two times a day (BID) | ORAL | Status: DC
  Administered 2016-01-18 – 2016-01-19 (×2): 250 mg via ORAL
  Filled 2016-01-18 (×2): qty 1

## 2016-01-18 MED ORDER — LORAZEPAM 2 MG/ML IJ SOLN
1.0000 mg | Freq: Once | INTRAMUSCULAR | Status: AC
  Administered 2016-01-18: 16:00:00 via INTRAVENOUS
  Filled 2016-01-18: qty 1

## 2016-01-18 MED ORDER — DEXTROSE-NACL 5-0.45 % IV SOLN: INTRAVENOUS | Status: DC

## 2016-01-18 MED ORDER — OXYCODONE-ACETAMINOPHEN 7.5-325 MG PO TABS: 1.0000 | ORAL_TABLET | ORAL | Status: DC | PRN

## 2016-01-18 MED ORDER — AMITRIPTYLINE HCL 25 MG PO TABS
100.0000 mg | ORAL_TABLET | Freq: Every day | ORAL | Status: DC
  Administered 2016-01-18: 100 mg via ORAL
  Filled 2016-01-18: qty 4

## 2016-01-18 MED ORDER — OXYCODONE HCL 5 MG PO TABS
2.5000 mg | ORAL_TABLET | ORAL | Status: DC | PRN
  Administered 2016-01-19 (×2): 2.5 mg via ORAL
  Filled 2016-01-18 (×4): qty 1

## 2016-01-18 MED ORDER — SODIUM CHLORIDE 0.9 % IV BOLUS (SEPSIS)
1000.0000 mL | Freq: Once | INTRAVENOUS | Status: AC
  Administered 2016-01-18: 1000 mL via INTRAVENOUS

## 2016-01-18 MED ORDER — DICYCLOMINE HCL 10 MG PO CAPS: 10.0000 mg | ORAL_CAPSULE | Freq: Three times a day (TID) | ORAL | Status: DC | PRN

## 2016-01-18 MED ORDER — ACETAMINOPHEN 325 MG PO TABS: 650.0000 mg | ORAL_TABLET | Freq: Four times a day (QID) | ORAL | Status: DC | PRN

## 2016-01-18 MED ORDER — ONDANSETRON HCL 4 MG PO TABS: 4.0000 mg | ORAL_TABLET | Freq: Four times a day (QID) | ORAL | Status: DC | PRN

## 2016-01-18 MED ORDER — SODIUM CHLORIDE 0.9% FLUSH
3.0000 mL | Freq: Two times a day (BID) | INTRAVENOUS | Status: DC
  Administered 2016-01-18: 3 mL via INTRAVENOUS

## 2016-01-18 MED ORDER — DIPHENOXYLATE-ATROPINE 2.5-0.025 MG PO TABS
1.0000 | ORAL_TABLET | Freq: Four times a day (QID) | ORAL | Status: DC | PRN
  Administered 2016-01-19: 1 via ORAL
  Filled 2016-01-18: qty 1

## 2016-01-18 MED ORDER — ACETAMINOPHEN 650 MG RE SUPP: 650.0000 mg | Freq: Four times a day (QID) | RECTAL | Status: DC | PRN

## 2016-01-18 MED ORDER — KCL IN DEXTROSE-NACL 20-5-0.45 MEQ/L-%-% IV SOLN
INTRAVENOUS | Status: DC
  Administered 2016-01-19: 07:00:00 via INTRAVENOUS
  Filled 2016-01-18 (×2): qty 1000

## 2016-01-18 MED ORDER — INSULIN PUMP
Freq: Three times a day (TID) | SUBCUTANEOUS | Status: DC
  Administered 2016-01-19: 1 via SUBCUTANEOUS
  Administered 2016-01-19: 0.75 via SUBCUTANEOUS
  Filled 2016-01-18: qty 1

## 2016-01-18 NOTE — ED Notes (Signed)
No respiratory or acute distress noted alert and oriented x 3 call light in reach visitor at bedside no reaction to medication noted. 

## 2016-01-18 NOTE — ED Triage Notes (Signed)
PT C/O ABDOMINAL PAIN, N/V SINCE THIS MORNING. PT STS SHE IS A TYPE I DIABETIC AND FEELS THAT SHE MAY BE GOING INTO DKA. PT STS SHE PERFORMED A URINE DIP STICK FOR KETONES AND IT READ "HI". PT ALSO STS FRUITY BREATH FROM FAMILY MEMBERS.

## 2016-01-18 NOTE — ED Notes (Signed)
No respiratory or acute distress noted alert and oriented x 3 call light in reach visitor at bedside pt refuses to let nurse start insulin drip ER provider informed of pt wishes.

## 2016-01-18 NOTE — ED Triage Notes (Signed)
Pt requested that she continue to receive a couple of bags of fluid and leave her insulin pump intact before starting the insulin drip. Spoke with Rob PA and Dr Ralene Bathe regarding her request and the request for additional pain meds. At present it was agreed that she will receive the fluid and then reevaluate for insulin and pain meds. Pt updated

## 2016-01-18 NOTE — ED Triage Notes (Signed)
Dr Ralene Bathe will evaluate pt shortly.

## 2016-01-18 NOTE — ED Provider Notes (Signed)
[facility] DEPT Provider Note   CSN: [id number] Arrival date & time: 01/18/16  1501     History   Chief Complaint Chief Complaint  Patient presents with  . Abdominal Pain  . Emesis    HPI [name] is a 35 y.o. female.  Patient presents to the ED with a chief complaint abdominal pain and vomiting.  She states that she thinks she has been going into DKA.  She wears an insulin pump, but states that she has been sick with a stomach bug and a UTI recently and thinks this set her off.  She denies any fevers or chills.  She reports associated abdominal pain and cramping.  There are no modifying factors.  She states that she used a ketone strip and home and the shade indicating the most ketones was present.   The history is provided by the patient. No language interpreter was used.    Past Medical History:  Diagnosis Date  . Anxiety   . Diabetes mellitus without complication ([place])    TYPE I  . Hypothyroidism   . Seizures [facility])     Patient Active Problem List   Diagnosis Date Noted  . Chronic diarrhea 10/15/2015  . Volume depletion 10/15/2015  . Sinus tachycardia 10/15/2015  . Restless leg syndrome 07/11/2015  . Diabetic neuropathy ([place]) 07/11/2015  . DKA (diabetic ketoacidoses) ([place]) 07/05/2015  . Anxiety state 05/26/2015  . Hyperglycemia due to type 1 diabetes mellitus ([place]) 05/03/2015  . Nausea vomiting and diarrhea 05/03/2015  . Insomnia 04/01/2015  . Malnutrition of moderate degree 03/12/2015  . DM type 1 (diabetes mellitus, type 1) ([place]) 03/11/2015  . Hyperglycemic hyperosmolar nonketotic coma ([place]) 03/11/2015  . Diarrhea due to malabsorption 03/11/2015  . Hyperglycemia 03/11/2015  . Hyperkalemia 01/11/2015  . Exocrine pancreatic insufficiency 07/30/2014  . Adjustment disorder with mixed anxiety and depressed mood 07/27/2014  . Personal history of noncompliance with medical treatment 06/22/2014  . Protein-calorie malnutrition, severe ([place]) 05/31/2014    . Diabetic gastroparesis associated with type 1 diabetes mellitus ([place]) 03/03/2014    Past Surgical History:  Procedure Laterality Date  . BACK SURGERY     age 68  . COLONOSCOPY WITH PROPOFOL N/A 12/02/2015   Procedure: COLONOSCOPY WITH PROPOFOL;  Surgeon: Lollie Sails, MD;  Location: [facility] ENDOSCOPY;  Service: Endoscopy;  Laterality: N/A;  . COLONOSCOPY WITH PROPOFOL N/A 01/02/2016   Procedure: COLONOSCOPY WITH PROPOFOL;  Surgeon: Lollie Sails, MD;  Location: [facility] ENDOSCOPY;  Service: Endoscopy;  Laterality: N/A;  . ESOPHAGOGASTRODUODENOSCOPY (EGD) WITH PROPOFOL N/A 06/27/2014   Procedure: ESOPHAGOGASTRODUODENOSCOPY (EGD) WITH PROPOFOL;  Surgeon: Milus Banister, MD;  Location: [facility] ENDOSCOPY;  Service: Endoscopy;  Laterality: N/A;  . FLEXIBLE SIGMOIDOSCOPY N/A 06/24/2014   Procedure: FLEXIBLE SIGMOIDOSCOPY;  Surgeon: Milus Banister, MD;  Location: [facility] ENDOSCOPY;  Service: Endoscopy;  Laterality: N/A;  . LAPAROSCOPY  02/2002, 06/2010   laparoscopy with lysis pelvic adhesions for pelvic pain  . ROOT CANAL  10/10/12  . TOOTH EXTRACTION      OB History    No data available       Home Medications    Prior to Admission medications   Medication Sig Start Date End Date Taking? Authorizing Provider  acetaminophen (TYLENOL) 500 MG tablet Take 500-1,000 mg by mouth every 6 (six) hours as needed for mild pain, moderate pain or headache.     Historical Provider, MD  ALPRAZolam Duanne Moron) 1 MG tablet Take 2 mg by mouth at bedtime.  Historical Provider, MD  amitriptyline (ELAVIL) 50 MG tablet Take 100 mg by mouth at bedtime.    Historical Provider, MD  calcium carbonate (OSCAL) 1500 (600 Ca) MG TABS tablet Take 1,200 mg of elemental calcium by mouth daily with breakfast.    Historical Provider, MD  cephALEXin (KEFLEX) 500 MG capsule Take 1 capsule (500 mg total) by mouth 4 (four) times daily. 01/09/16   Courteney Lyn Mackuen, MD  cholestyramine (QUESTRAN) 4 g packet Take 4 g by mouth 4 (four)  times daily.    Historical Provider, MD  CREON 36000 UNITS CPEP capsule Take 144,000-216,000 Units by mouth 5 (five) times daily. Pt takes six capsules three times daily with meals and four capsules two times daily with snacks.    Historical Provider, MD  cyclobenzaprine (FLEXERIL) 10 MG tablet Take 10 mg by mouth 3 (three) times daily as needed for muscle spasms.     Historical Provider, MD  dicyclomine (BENTYL) 10 MG capsule Take 10 mg by mouth 3 (three) times daily as needed for spasms.     Historical Provider, MD  diphenoxylate-atropine (LOMOTIL) 2.5-0.025 MG per tablet Take 1 tablet by mouth 4 (four) times daily as needed for diarrhea or loose stools. 11/08/14   Orpah Greek, MD  feeding supplement, GLUCERNA SHAKE, (GLUCERNA SHAKE) LIQD Take 237 mLs by mouth 6 (six) times daily.    Historical Provider, MD  gabapentin (NEURONTIN) 300 MG capsule Take 300 mg by mouth 3 (three) times daily.    Historical Provider, MD  glucagon (GLUCAGON EMERGENCY) 1 MG injection Inject 1 mg into the vein once as needed (for severe hypoglycemia).    Historical Provider, MD  hydrOXYzine (ATARAX/VISTARIL) 25 MG tablet Take 25 mg by mouth 3 (three) times daily as needed for anxiety.    Historical Provider, MD  hyoscyamine (LEVSIN SL) 0.125 MG SL tablet Place 0.125 mg under the tongue every 4 (four) hours as needed for cramping or pain.    Historical Provider, MD  Insulin Human (INSULIN PUMP) SOLN Inject 1 each into the skin continuous. Pt uses Humalog -- 10 units per day and 0-5 with meals    Historical Provider, MD  insulin lispro (HUMALOG) 100 UNIT/ML injection Inject 0-10 Units into the skin continuous. Use with insulin pump-- 10 units a day and 0-5 with meals    Historical Provider, MD  Multiple Vitamin (MULTIVITAMIN WITH MINERALS) TABS Take 2 tablets by mouth daily.     Historical Provider, MD  omeprazole (PRILOSEC) 40 MG capsule Take 40 mg by mouth daily.     Historical Provider, MD  ondansetron (ZOFRAN ODT)  4 MG disintegrating tablet Take 1 tablet (4 mg total) by mouth every 8 (eight) hours as needed for nausea or vomiting. Patient not taking: Reported on 01/09/2016 11/02/15   Samantha Tripp Dowless, PA-C  oxyCODONE-acetaminophen (PERCOCET) 7.5-325 MG tablet Take 1 tablet by mouth every 4 (four) hours as needed for severe pain.     Historical Provider, MD  Probiotic Product (DIGESTIVE ADVANTAGE GUMMIES PO) Take 2 each by mouth 2 (two) times daily.     Historical Provider, MD  promethazine (PHENERGAN) 25 MG suppository Place 25 mg rectally every 6 (six) hours as needed for nausea or vomiting.    Historical Provider, MD  promethazine (PHENERGAN) 25 MG tablet TAKE 1 TABLET (25 MG TOTAL) BY MOUTH EVERY 6 (SIX) HOURS AS NEEDED FOR NAUSEA OR VOMITING. 12/18/15   Arnoldo Morale, MD  saccharomyces boulardii (FLORASTOR) 250 MG capsule Take 1 capsule (250  mg total) by mouth 2 (two) times daily. 07/06/14   Thurnell Lose, MD  traZODone (DESYREL) 50 MG tablet TAKE 1 TABLET BY MOUTH AT BEDTIME AS NEEDED FOR SLEEP. 11/20/15   Arnoldo Morale, MD  vitamin B-12 (CYANOCOBALAMIN) 1000 MCG tablet Take 1,000 mcg by mouth 2 (two) times daily.    Historical Provider, MD    Family History Family History  Problem Relation Age of Onset  . Diabetes Maternal Grandmother   . Cancer Maternal Grandmother     Leukemia  . Cancer Maternal Grandfather     Small cell lung cancer    Social History Social History  Substance Use Topics  . Smoking status: Former Smoker    Quit date: 04/19/2000  . Smokeless tobacco: Former Systems developer  . Alcohol use No     Comment: RARE SIPS OF WINE     Allergies   Morphine; Scallops [shellfish allergy]; Betamethasone; Diphenhydramine hcl; Haloperidol and related; Ketorolac; Latex; and Other   Review of Systems Review of Systems  All other systems reviewed and are negative.    Physical Exam Updated Vital Signs BP 113/88 (BP Location: Right Arm)   Pulse (!) 136   Temp 98.2 F (36.8 C) (Oral)    Resp 16   Ht 5\' 5"  (1.651 m)   Wt 45.4 kg   SpO2 100%   BMI 16.64 kg/m   Physical Exam  Constitutional: She is oriented to person, place, and time. She appears well-developed and well-nourished.  HENT:  Head: Normocephalic and atraumatic.  Eyes: Conjunctivae and EOM are normal. Pupils are equal, round, and reactive to light.  Neck: Normal range of motion. Neck supple.  Cardiovascular: Normal rate and regular rhythm.  Exam reveals no gallop and no friction rub.   No murmur heard. Pulmonary/Chest: Effort normal and breath sounds normal. No respiratory distress. She has no wheezes. She has no rales. She exhibits no tenderness.  Abdominal: Soft. Bowel sounds are normal. She exhibits no distension and no mass. There is no tenderness. There is no rebound and no guarding.  Musculoskeletal: Normal range of motion. She exhibits no edema or tenderness.  Neurological: She is alert and oriented to person, place, and time.  Skin: Skin is warm and dry.  Psychiatric: She has a normal mood and affect. Her behavior is normal. Judgment and thought content normal.  Nursing note and vitals reviewed.    ED Treatments / Results  Labs (all labs ordered are listed, but only abnormal results are displayed) Labs Reviewed  CBC - Abnormal; Notable for the following:       Result Value   Hemoglobin 11.9 (*)    HCT 35.6 (*)    All other components within normal limits  BLOOD GAS, VENOUS - Abnormal; Notable for the following:    pCO2, Ven 32.6 (*)    Bicarbonate 18.9 (*)    Acid-base deficit 4.7 (*)    All other components within normal limits  COMPREHENSIVE METABOLIC PANEL - Abnormal; Notable for the following:    Sodium 130 (*)    Chloride 96 (*)    CO2 21 (*)    Glucose, Bld 514 (*)    Calcium 8.6 (*)    AST 142 (*)    ALT 85 (*)    Alkaline Phosphatase 181 (*)    All other components within normal limits  CBG MONITORING, ED - Abnormal; Notable for the following:    Glucose-Capillary 524 (*)     All other components within normal limits  CBG MONITORING, ED - Abnormal; Notable for the following:    Glucose-Capillary 471 (*)    All other components within normal limits  CBG MONITORING, ED - Abnormal; Notable for the following:    Glucose-Capillary 466 (*)    All other components within normal limits  CBG MONITORING, ED - Abnormal; Notable for the following:    Glucose-Capillary 447 (*)    All other components within normal limits  LIPASE, BLOOD  URINALYSIS, ROUTINE W REFLEX MICROSCOPIC (NOT AT [facility])  RAPID URINE DRUG SCREEN, HOSP PERFORMED  POC URINE PREG, ED    EKG  EKG Interpretation None       Radiology No results found.  Procedures Procedures (including critical care time)  Medications Ordered in ED Medications  sodium chloride 0.9 % bolus 1,000 mL (not administered)  HYDROmorphone (DILAUDID) injection 1 mg (not administered)  ondansetron (ZOFRAN) injection 4 mg (not administered)  LORazepam (ATIVAN) injection 1 mg (not administered)  insulin regular (NOVOLIN R,HUMULIN R) 250 Units in sodium chloride 0.9 % 250 mL (1 Units/mL) infusion (not administered)  dextrose 5 %-0.45 % sodium chloride infusion (not administered)     Initial Impression / Assessment and Plan / ED Course  I have reviewed the triage vital signs and the nursing notes.  Pertinent labs & imaging results that were available during my care of the patient were reviewed by me and considered in my medical decision making (see chart for details).  Clinical Course    Patient with hyperglycemia, nausea, vomiting, and abdominal pain. She is also noted to be tachycardic to the 130s on arrival. She has been using her insulin pump.  Laboratory workup was fairly reassuring thus far, she does not appear to be acidotic, her anion gap is 13.  Patient declines insulin drip, and would like to just give fluids and use her insulin pump.  8:05 PM We have not had any success in lowering the patient's blood  sugar using her insulin pump and fluids. Patient seen by and discussed with Dr. Ralene Bathe, who recommends insulin drip and admission to the hospitalist service.  Appreciate Dr. Lorin Mercy for observing the patient overnight.  Will order a telemetry bed. Dr. Lorin Mercy asks that the patient not be placed on an insulin drip, as she is not technically in DKA. Notified nursing team.  I have a high degree of suspicion for drug seeking behavior in this patient.   Final Clinical Impressions(s) / ED Diagnoses   Final diagnoses:  Hyperglycemia  Tachycardia    New Prescriptions New Prescriptions   No medications on file     Montine Circle, PA-C 01/18/16 2007    Dorie Rank, MD 01/19/16 1154

## 2016-01-18 NOTE — ED Triage Notes (Signed)
CBG still elevated. Spoke with [employer], requested another bolus, Dr Ralene Bathe will evaluate pt and decide next step in POC. Pt updated.

## 2016-01-18 NOTE — H&P (Signed)
History and Physical    [name] [id number] DOB: 1980/09/20 DOA: 01/18/2016  PCP: Arnoldo Morale, MD Consultants:  [facility] - endocrine and GI; [place] - endocrinology Patient coming from: home - lives with 2 children; NOK: NOK: mother, [phone]  Chief Complaint: DKA  HPI: [name] is a 35 y.o. female with medical history significant of type 1 DM that is brittle despite insulin pump.  Patient says she "is very DKA prone, when I get sick that kind of perpetuates the DKA on me."  She reports having had a UTI a week ago (no culture), treated with antibiotics for 4 days but the Keflex made her very sick and so she stopped taking it.  Developed severe diarrhea, has usual diarrhea but this was particularly bad.  Lasted for several days.  Thinks she has had 10-15 stools today.  Has to wear diapers due to accidents.  No stools while in the ER, reports only going after eating.  Also with n/v, 2 days bad but also chronic.  +abdominal pain, severe - normally has it but not this severe.  Takes oxycodone for it at home.  Requests more than she usually takes while here because she has a tolerance to it.  Does not think there is a problem with her pump that led to DKA.  No fevers.  Mild hyperventilation, improved with IVF in the ER.   ED Course: Per PA-C Browning: Patient with hyperglycemia, nausea, vomiting, and abdominal pain. She is also noted to be tachycardic to the 130s on arrival. She has been using her insulin pump.  Laboratory workup was fairly reassuring thus far, she does not appear to be acidotic, her anion gap is 13. Patient declines insulin drip, and would like to just give fluids and use her insulin pump.  8:05 PM  We have not had any success in lowering the patient's blood sugar using her insulin pump and fluids. Patient seen by and discussed with Dr. Ralene Bathe, who recommends insulin drip and admission to the hospitalist service.  Appreciate Dr. Lorin Mercy for observing the  patient overnight. Will order a telemetry bed. Dr. Lorin Mercy asks that the patient not be placed on an insulin drip, as she is not technically in DKA. Notified nursing team.  I have a high degree of suspicion for drug seeking behavior in this patient.    Review of Systems: As per HPI; otherwise 10 point review of systems reviewed and negative.   Ambulatory Status:  Ambulates without difficulty  Past Medical History:  Diagnosis Date  . Anxiety   . Diabetes mellitus without complication ([place])    TYPE I  . Seizures ([place])    last seizure was over 10 years ago, not on medications    Past Surgical History:  Procedure Laterality Date  . BACK SURGERY     age 19  . COLONOSCOPY WITH PROPOFOL N/A 12/02/2015   Procedure: COLONOSCOPY WITH PROPOFOL;  Surgeon: Lollie Sails, MD;  Location: [facility] ENDOSCOPY;  Service: Endoscopy;  Laterality: N/A;  . COLONOSCOPY WITH PROPOFOL N/A 01/02/2016   Procedure: COLONOSCOPY WITH PROPOFOL;  Surgeon: Lollie Sails, MD;  Location: [facility] ENDOSCOPY;  Service: Endoscopy;  Laterality: N/A;  . ESOPHAGOGASTRODUODENOSCOPY (EGD) WITH PROPOFOL N/A 06/27/2014   Procedure: ESOPHAGOGASTRODUODENOSCOPY (EGD) WITH PROPOFOL;  Surgeon: Milus Banister, MD;  Location: [facility] ENDOSCOPY;  Service: Endoscopy;  Laterality: N/A;  . FLEXIBLE SIGMOIDOSCOPY N/A 06/24/2014   Procedure: FLEXIBLE SIGMOIDOSCOPY;  Surgeon: Milus Banister, MD;  Location: [facility] ENDOSCOPY;  Service:  Endoscopy;  Laterality: N/A;  . LAPAROSCOPY  02/2002, 06/2010   laparoscopy with lysis pelvic adhesions for pelvic pain  . ROOT CANAL  10/10/12  . TOOTH EXTRACTION      Social History   Social History  . Marital status: Single    Spouse name: N/A  . Number of children: N/A  . Years of education: N/A   Occupational History  . 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 Smoking status: Former Smoker    Quit date: 04/19/2000  . Smokeless tobacco: Former Systems developer  . Alcohol use No     Comment: RARE SIPS OF WINE  .  Drug use: No  . Sexual activity: No   Other Topics Concern  . Not on file   Social History Narrative   Single.  Lives with children.    Allergies  Allergen Reactions  . Morphine Anaphylaxis, Itching and Other (See Comments)    Pt states that her lips turn blue.    Elyse Hsu [Shellfish Allergy] Anaphylaxis  . Betamethasone Swelling and Other (See Comments)    Reaction:  Facial swelling  . Diphenhydramine Hcl Nausea Only and Other (See Comments)    Pt states that she is only allergic to the IV form.    . Haloperidol And Related Itching  . Ketorolac Itching  . Latex Rash  . Other Rash and Other (See Comments)    Pt states that she is allergic to sunscreen and grass.     Family History  Problem Relation Age of Onset  . Diabetes Maternal Grandmother   . Cancer Maternal Grandmother     Leukemia  . Cancer Maternal Grandfather     Small cell lung cancer    Prior to Admission medications   Medication Sig Start Date End Date Taking? Authorizing Provider  acetaminophen (TYLENOL) 500 MG tablet Take 500-1,000 mg by mouth every 6 (six) hours as needed for mild pain, moderate pain or headache.    Yes Historical Provider, MD  ALPRAZolam Duanne Moron) 1 MG tablet Take 2 mg by mouth at bedtime.   Yes Historical Provider, MD  amitriptyline (ELAVIL) 50 MG tablet Take 100 mg by mouth at bedtime.   Yes Historical Provider, MD  calcium carbonate (OSCAL) 1500 (600 Ca) MG TABS tablet Take 1,200 mg of elemental calcium by mouth daily with breakfast.   Yes Historical Provider, MD  cholestyramine (QUESTRAN) 4 g packet Take 4 g by mouth 4 (four) times daily.   Yes Historical Provider, MD  CREON 36000 UNITS CPEP capsule Take 144,000-216,000 Units by mouth 5 (five) times daily. Pt takes six capsules three times daily with meals and four capsules two times daily with snacks.   Yes Historical Provider, MD  cyclobenzaprine (FLEXERIL) 10 MG tablet Take 10 mg by mouth 2 (two) times daily.    Yes Historical  Provider, MD  dicyclomine (BENTYL) 10 MG capsule Take 10 mg by mouth 3 (three) times daily as needed for spasms.    Yes Historical Provider, MD  diphenoxylate-atropine (LOMOTIL) 2.5-0.025 MG per tablet Take 1 tablet by mouth 4 (four) times daily as needed for diarrhea or loose stools. 11/08/14  Yes Orpah Greek, MD  feeding supplement, GLUCERNA SHAKE, (GLUCERNA SHAKE) LIQD Take 237 mLs by mouth 6 (six) times daily.   Yes Historical Provider, MD  gabapentin (NEURONTIN) 300 MG capsule Take 300 mg by mouth 3 (three) times daily.   Yes Historical Provider, MD  glucagon (GLUCAGON EMERGENCY) 1 MG injection Inject 1  mg into the vein once as needed (for severe hypoglycemia).   Yes Historical Provider, MD  hydrOXYzine (ATARAX/VISTARIL) 25 MG tablet Take 25 mg by mouth 3 (three) times daily as needed for anxiety.   Yes Historical Provider, MD  hyoscyamine (LEVSIN SL) 0.125 MG SL tablet Place 0.125 mg under the tongue every 4 (four) hours as needed for cramping or pain.   Yes Historical Provider, MD  Insulin Human (INSULIN PUMP) SOLN Pt uses Humalog -- 10 units once daily and 0-5 units with meals.   Yes Historical Provider, MD  Multiple Vitamin (MULTIVITAMIN WITH MINERALS) TABS Take 2 tablets by mouth daily.    Yes Historical Provider, MD  omeprazole (PRILOSEC) 40 MG capsule Take 40 mg by mouth daily.    Yes Historical Provider, MD  oxyCODONE-acetaminophen (PERCOCET) 7.5-325 MG tablet Take 1 tablet by mouth every 4 (four) hours as needed for severe pain.    Yes Historical Provider, MD  Probiotic Product (DIGESTIVE ADVANTAGE GUMMIES PO) Take 2 each by mouth 2 (two) times daily.    Yes Historical Provider, MD  promethazine (PHENERGAN) 25 MG suppository Place 25 mg rectally every 6 (six) hours as needed for nausea or vomiting.   Yes Historical Provider, MD  promethazine (PHENERGAN) 25 MG tablet Take 25 mg by mouth every 6 (six) hours as needed for nausea or vomiting.   Yes Historical Provider, MD    saccharomyces boulardii (FLORASTOR) 250 MG capsule Take 1 capsule (250 mg total) by mouth 2 (two) times daily. 07/06/14  Yes Thurnell Lose, MD  traZODone (DESYREL) 50 MG tablet Take 50 mg by mouth at bedtime.   Yes Historical Provider, MD  vitamin B-12 (CYANOCOBALAMIN) 1000 MCG tablet Take 1,000 mcg by mouth 2 (two) times daily.   Yes Historical Provider, MD  cephALEXin (KEFLEX) 500 MG capsule Take 1 capsule (500 mg total) by mouth 4 (four) times daily. Patient not taking: Reported on 01/18/2016 01/09/16   Courteney Lyn Mackuen, MD    Physical Exam: Vitals:   01/18/16 1600 01/18/16 1813 01/18/16 2000 01/18/16 2207  BP: 121/84 132/95 (!) 133/106 113/83  Pulse: 111 120 102 100  Resp: 21 20 19 20   Temp:    99 F (37.2 C)  TempSrc:    Oral  SpO2: 99% 98% 100% 100%  Weight:    47.7 kg (105 lb 3.2 oz)  Height:    5\' 5"  (1.651 m)     General:  Appears calm and comfortable and is NAD, asking for pain medication Eyes:  PERRL, EOMI, normal lids, iris ENT:  grossly normal hearing, lips & tongue, mmm Neck:  no LAD, masses or thyromegaly Cardiovascular:  RRR, no m/r/g. No LE edema.  Respiratory:  CTA bilaterally, no w/r/r. Normal respiratory effort. Abdomen:  soft, diffusely tender to palpation, nd, NABS Skin:  no rash or induration seen on limited exam Musculoskeletal:  grossly normal tone BUE/BLE, good ROM, no bony abnormality Psychiatric:  grossly normal mood and affect, speech fluent and appropriate, AOx3 Neurologic:  CN 2-12 grossly intact, moves all extremities in coordinated fashion, sensation intact  Labs on Admission: I have personally reviewed following labs and imaging studies  CBC:  Recent Labs Lab 01/18/16 1645  WBC 7.0  HGB 11.9*  HCT 35.6*  MCV 90.1  PLT 99991111   Basic Metabolic Panel:  Recent Labs Lab 01/18/16 1645 01/18/16 2154  NA 130* 135  K 4.1 3.7  CL 96* 107  CO2 21* 21*  GLUCOSE 514* 313*  BUN 14  9  CREATININE 0.84 0.65  CALCIUM 8.6* 7.9*    GFR: Estimated Creatinine Clearance: 73.9 mL/min (by C-G formula based on SCr of 0.65 mg/dL). Liver Function Tests:  Recent Labs Lab 01/18/16 1645  AST 142*  ALT 85*  ALKPHOS 181*  BILITOT 1.2  PROT 7.6  ALBUMIN 4.2    Recent Labs Lab 01/18/16 1645  LIPASE 14   No results for input(s): AMMONIA in the last 168 hours. Coagulation Profile: No results for input(s): INR, PROTIME in the last 168 hours. Cardiac Enzymes: No results for input(s): CKTOTAL, CKMB, CKMBINDEX, TROPONINI in the last 168 hours. BNP (last 3 results) No results for input(s): PROBNP in the last 8760 hours. HbA1C: No results for input(s): HGBA1C in the last 72 hours. CBG:  Recent Labs Lab 01/18/16 1647 01/18/16 1811 01/18/16 1930 01/18/16 2205 01/19/16 0003  GLUCAP 471* 466* 447* 272* 243*   Lipid Profile: No results for input(s): CHOL, HDL, LDLCALC, TRIG, CHOLHDL, LDLDIRECT in the last 72 hours. Thyroid Function Tests: No results for input(s): TSH, T4TOTAL, FREET4, T3FREE, THYROIDAB in the last 72 hours. Anemia Panel: No results for input(s): VITAMINB12, FOLATE, FERRITIN, TIBC, IRON, RETICCTPCT in the last 72 hours. Urine analysis:    Component Value Date/Time   COLORURINE YELLOW 01/18/2016 1926   APPEARANCEUR CLOUDY (A) 01/18/2016 1926   LABSPEC 1.027 01/18/2016 1926   PHURINE 6.0 01/18/2016 1926   GLUCOSEU >1000 (A) 01/18/2016 1926   HGBUR NEGATIVE 01/18/2016 1926   BILIRUBINUR NEGATIVE 01/18/2016 1926   BILIRUBINUR neg 05/26/2015 1441   KETONESUR 40 (A) 01/18/2016 1926   PROTEINUR NEGATIVE 01/18/2016 1926   UROBILINOGEN 0.2 05/26/2015 1441   UROBILINOGEN 0.2 01/04/2015 1436   NITRITE NEGATIVE 01/18/2016 1926   LEUKOCYTESUR NEGATIVE 01/18/2016 1926    Creatinine Clearance: Estimated Creatinine Clearance: 73.9 mL/min (by C-G formula based on SCr of 0.65 mg/dL).  Sepsis Labs: @LABRCNTIP (procalcitonin:4,lacticidven:4) )No results found for this or any previous visit (from the  past 240 hour(s)).   Radiological Exams on Admission: No results found.  EKG: not done  Assessment/Plan Principal Problem:   Hyperglycemia Active Problems:   Diabetic gastroparesis associated with type 1 diabetes mellitus (HCC)   Diarrhea due to malabsorption   Nausea vomiting and diarrhea   Anxiety state   Chronic diarrhea   Chronic pain syndrome   Elevated LFTs   Hyperglycemia -Patient with poor baseline control, reports "brittle" diabetes -Has hyperglycemia but really does not meet criteria for DKA (CO2 is 21, otherwise ABG pretty unremarkable) -Possible diarrheal illness as source, but patient has chronic GI symptoms so hard to say; will check for C. Diff  -Otherwise no apparent reason for DKA -Will admit to telemetry -NPO for now -Would recommend continuing insulin pump while supplementing with SSI given lack of DKA -K+ 4.1 at time of presentation; potassium supplementation added -IVF at 150 cc/hr LR until glucose <250 and then decrease rate to 125 and change to D51/2NS -If glucose stabilizes overnight, patient likely appropriate for discharge tomorrow  Diarrhea -Chronic, due to malabsorption -Will check for C. Diff - she did have exposure to antibiotics -Has ongoing GI symptoms, suggest outpatient f/u -She does have mild LFT elevation; if ongoing, may need hepatitis evaluation -Will repeat LFTs in AM to see if these improve with IVF hydration and improved glycemic control -Continue Bentyl, Levsin, Lomotil  Anxiety -Continue Elevil, Xanax, hydroxyzine  Chronic pain -I have reviewed this patient in the [place] Controlled Substances Reporting System.  She is receiving medications from multiple providers but  appears to be taking them as prescribed.    DVT prophylaxis: Lovenox Code Status:  Full - confirmed with patient Family Communication: Daughter present, otherwise no one Disposition Plan:  Home once clinically improved Consults called: None  Admission status: It  is my clinical opinion that referral for OBSERVATION is reasonable and necessary in this patient based on the above information provided. The aforementioned taken together are felt to place the patient at high risk for further clinical deterioration. However it is anticipated that the patient may be medically stable for discharge from the hospital within 24 to 48 hours.     Karmen Bongo MD [facility]  If 7PM-7AM, please contact night-coverage [email or web address] Password [email or web address]  01/19/2016, 12:50 AM

## 2016-01-19 DIAGNOSIS — K529 Noninfective gastroenteritis and colitis, unspecified: Secondary | ICD-10-CM

## 2016-01-19 DIAGNOSIS — R7989 Other specified abnormal findings of blood chemistry: Secondary | ICD-10-CM

## 2016-01-19 DIAGNOSIS — E1043 Type 1 diabetes mellitus with diabetic autonomic (poly)neuropathy: Secondary | ICD-10-CM | POA: Diagnosis not present

## 2016-01-19 DIAGNOSIS — R112 Nausea with vomiting, unspecified: Secondary | ICD-10-CM

## 2016-01-19 DIAGNOSIS — R197 Diarrhea, unspecified: Secondary | ICD-10-CM

## 2016-01-19 DIAGNOSIS — R739 Hyperglycemia, unspecified: Secondary | ICD-10-CM | POA: Diagnosis not present

## 2016-01-19 DIAGNOSIS — K3184 Gastroparesis: Secondary | ICD-10-CM

## 2016-01-19 DIAGNOSIS — R945 Abnormal results of liver function studies: Secondary | ICD-10-CM

## 2016-01-19 DIAGNOSIS — F411 Generalized anxiety disorder: Secondary | ICD-10-CM

## 2016-01-19 DIAGNOSIS — K909 Intestinal malabsorption, unspecified: Secondary | ICD-10-CM

## 2016-01-19 DIAGNOSIS — G894 Chronic pain syndrome: Secondary | ICD-10-CM | POA: Diagnosis present

## 2016-01-19 LAB — GLUCOSE, CAPILLARY
GLUCOSE-CAPILLARY: 243 mg/dL — AB (ref 65–99)
GLUCOSE-CAPILLARY: 250 mg/dL — AB (ref 65–99)
GLUCOSE-CAPILLARY: 257 mg/dL — AB (ref 65–99)
GLUCOSE-CAPILLARY: 398 mg/dL — AB (ref 65–99)
Glucose-Capillary: 282 mg/dL — ABNORMAL HIGH (ref 65–99)
Glucose-Capillary: 262 mg/dL — ABNORMAL HIGH (ref 65–99)

## 2016-01-19 LAB — BASIC METABOLIC PANEL
ANION GAP: 4 — AB (ref 5–15)
ANION GAP: 5 (ref 5–15)
BUN: 8 mg/dL (ref 6–20)
BUN: 7 mg/dL (ref 6–20)
CALCIUM: 7.7 mg/dL — AB (ref 8.9–10.3)
CALCIUM: 7.8 mg/dL — AB (ref 8.9–10.3)
CO2: 22 mmol/L (ref 22–32)
CO2: 21 mmol/L — AB (ref 22–32)
CREATININE: 0.45 mg/dL (ref 0.44–1.00)
Chloride: 108 mmol/L (ref 101–111)
Chloride: 108 mmol/L (ref 101–111)
Creatinine, Ser: 0.52 mg/dL (ref 0.44–1.00)
GFR calc Af Amer: >60 mL/min (ref >60)
GFR calc Af Amer: >60 mL/min (ref >60)
GFR calc non Af Amer: >60 mL/min (ref >60)
GFR calc non Af Amer: >60 mL/min (ref >60)
GLUCOSE: 274 mg/dL — AB (ref 65–99)
GLUCOSE: 270 mg/dL — AB (ref 65–99)
Potassium: 4 mmol/L (ref 3.5–5.1)
Potassium: 3.7 mmol/L (ref 3.5–5.1)
Sodium: 134 mmol/L — ABNORMAL LOW (ref 135–145)
Sodium: 134 mmol/L — ABNORMAL LOW (ref 135–145)

## 2016-01-19 LAB — CBC
HCT: 29.8 % — ABNORMAL LOW (ref 36.0–46.0)
HEMOGLOBIN: 10.1 g/dL — AB (ref 12.0–15.0)
MCH: 30 pg (ref 26.0–34.0)
MCHC: 33.9 g/dL (ref 30.0–36.0)
MCV: 88.4 fL (ref 78.0–100.0)
Platelets: 145 10*3/uL — ABNORMAL LOW (ref 150–400)
RBC: 3.37 MIL/uL — ABNORMAL LOW (ref 3.87–5.11)
RDW: 15.4 % (ref 11.5–15.5)
WBC: 5.5 10*3/uL (ref 4.0–10.5)

## 2016-01-19 LAB — HEPATIC FUNCTION PANEL
ALT: 54 U/L (ref 14–54)
AST: 72 U/L — AB (ref 15–41)
Albumin: 3.1 g/dL — ABNORMAL LOW (ref 3.5–5.0)
Alkaline Phosphatase: 134 U/L — ABNORMAL HIGH (ref 38–126)
Bilirubin, Direct: <0.1 mg/dL — ABNORMAL LOW (ref 0.1–0.5)
TOTAL PROTEIN: 5.8 g/dL — AB (ref 6.5–8.1)
Total Bilirubin: 0.7 mg/dL (ref 0.3–1.2)

## 2016-01-19 LAB — C DIFFICILE QUICK SCREEN W PCR REFLEX
C DIFFICLE (CDIFF) ANTIGEN: NEGATIVE
C Diff interpretation: NOT DETECTED
C Diff toxin: NEGATIVE

## 2016-01-19 MED ORDER — LACTATED RINGERS IV BOLUS (SEPSIS)
500.0000 mL | Freq: Once | INTRAVENOUS | Status: AC
  Administered 2016-01-19: 500 mL via INTRAVENOUS

## 2016-01-19 NOTE — Care Management Note (Signed)
Case Management Note  Patient Details  Name: NANETTE HURRELL MRN: [id number] Date of Birth: 1980/10/07  Subjective/Objective: 35 y/o f admitted w/hyperglycemia. From home, insulin pump. Referral for med asst-has medicaid-spoke to patient in rm-has pcp,pharmacy(aware of medicaid med list, & required co pay$3-11)she is able to afford,home is safe. No CM needs.                   Action/Plan:d/c home.   Expected Discharge Date:                  Expected Discharge Plan:  Home/Self Care  In-House Referral:     Discharge planning Services  Medication Assistance  Post Acute Care Choice:    Choice offered to:     DME Arranged:    DME Agency:     HH Arranged:    HH Agency:     Status of Service:     If discussed at [employer], dates discussed:    Additional Comments:  Dessa Phi, RN 01/19/2016, 11:55 AM

## 2016-01-19 NOTE — Discharge Summary (Signed)
Physician Discharge Summary  JAQUITTA HEADDEN [id number] DOB: [birth year] DOA: 01/18/2016  PCP: Arnoldo Morale, MD  Endocrinology: [facility]  Admit date: 01/18/2016 Discharge date: 01/19/2016  Admitted From: Home Discharge disposition: Home   Recommendations for Outpatient Follow-Up:   1. A follow-up appointment was made with the patient's primary endocrinologist to assess whether changes need to be made to her insulin pump or not. Patient was noted to be noncompliant with treatment recommendations while in the hospital, refusing to allow [place] to give her extra doses of insulin to address hyperglycemia. 2. Note: The patient has multiple prescribers for her controlled substances, recommend initiation of a contract and evaluation of the drug monitoring database to ensure that she is not at risk for polypharmacy/addiction problems.   Discharge Diagnosis:   Principal Problem:    Hyperglycemia Active Problems:    Diabetic gastroparesis associated with type 1 diabetes mellitus (HCC)    Diarrhea due to malabsorption    Nausea vomiting and diarrhea    Anxiety state    Chronic diarrhea    Chronic pain syndrome    Elevated LFTs    Opiate dependency    Polypharmacy   Discharge Condition: Improved.  Diet recommendation: Carbohydrate-modified.     History of Present Illness:   [name] is a 35 y.o. female with medical history significant of type 1 DM that is brittle despite insulin pump, opiate dependency, polypharmacy, chronic diarrhea who was admitted on 01/18/16 for evaluation of abdominal pain accompanied by nausea, vomiting and diarrhea.  Hospital Course by Problem:   Principal problem:  Hyperglycemia -Patient with poor baseline control, reports "brittle" diabetes. -Had hyperglycemia but did not meet criteria for DKA (CO2 is 21, otherwise ABG pretty unremarkable) -Possible diarrheal illness as source, but patient has chronic GI symptoms, C. Diff  negative. ? Opiate withdrawal. -Maintained on insulin pump while supplementing with SSI given lack of DKA (refused SSI) -K+ 4.1 at time of presentation; potassium supplementation added. -Given IVF at 150 cc/hr LR until glucose <250 and then decreased rate to 125 and changed to D51/2NS -Since non-adherent to treatment plan, will D/C home.  I have arranged for close F/U with her endocrinologist.  Active problems:  Diarrhea -Chronic, due to malabsorption. -C. difficile negative. -Recommend outpatient f/u. -She does have mild LFT elevation; if ongoing, may need hepatitis evaluation. -Continue Bentyl, Levsin, Lomotil. -Given chronic opiate use and drug seeking behavior, is possible that the patient's GI symptoms are reflective of withdrawal or narcotic bowel.  Anxiety -Continue Elevil, Xanax, hydroxyzine.  Chronic pain/opiate dependency/polypharmacy -[place] Controlled Substances Reporting System reviewed by admitting M.D.  She is receiving medications from multiple providers but appears to be taking them as prescribed.   Medical Consultants:    None.   Discharge Exam:   Vitals:   01/18/16 2207 01/19/16 0543  BP: 113/83 110/78  Pulse: 100 86  Resp: 20 16  Temp: 99 F (37.2 C) 97.8 F (36.6 C)   Vitals:   01/18/16 1813 01/18/16 2000 01/18/16 2207 01/19/16 0543  BP: 132/95 (!) 133/106 113/83 110/78  Pulse: 120 102 100 86  Resp: 20 19 20 16   Temp:   99 F (37.2 C) 97.8 F (36.6 C)  TempSrc:   Oral Oral  SpO2: 98% 100% 100% 100%  Weight:   47.7 kg (105 lb 3.2 oz)   Height:   5\' 5"  (1.651 m)     General exam: Appears calm and comfortable. Requesting to eat. Respiratory system: Clear to auscultation. Respiratory  effort normal. Cardiovascular system: S1 & S2 heard, RRR. No JVD,  rubs, gallops or clicks. No murmurs. Gastrointestinal system: Abdomen is nondistended, soft and nontender. No organomegaly or masses felt. Normal bowel sounds heard. Central nervous system: Alert  and oriented. No focal neurological deficits. Extremities: No clubbing,  or cyanosis. No edema. Skin: No rashes, lesions or ulcers. Multiple piercings/tattoos. Psychiatry: Judgement and insight appear impaired. Mood & affect appropriate.    The results of significant diagnostics from this hospitalization (including imaging, microbiology, ancillary and laboratory) are listed below for reference.     Procedures and Diagnostic Studies:   No results found.   Labs:   Basic Metabolic Panel:  Recent Labs Lab 01/18/16 1645 01/18/16 2154 01/19/16 0204 01/19/16 0537  NA 130* 135 134* 134*  K 4.1 3.7 4.0 3.7  CL 96* 107 108 108  CO2 21* 21* 22 21*  GLUCOSE 514* 313* 274* 270*  BUN 14 9 8 7   CREATININE 0.84 0.65 0.45 0.52  CALCIUM 8.6* 7.9* 7.7* 7.8*   GFR Estimated Creatinine Clearance: 73.9 mL/min (by C-G formula based on SCr of 0.52 mg/dL). Liver Function Tests:  Recent Labs Lab 01/18/16 1645 01/19/16 0537  AST 142* 72*  ALT 85* 54  ALKPHOS 181* 134*  BILITOT 1.2 0.7  PROT 7.6 5.8*  ALBUMIN 4.2 3.1*    Recent Labs Lab 01/18/16 1645  LIPASE 14   No results for input(s): AMMONIA in the last 168 hours. Coagulation profile No results for input(s): INR, PROTIME in the last 168 hours.  CBC:  Recent Labs Lab 01/18/16 1645 01/19/16 0537  WBC 7.0 5.5  HGB 11.9* 10.1*  HCT 35.6* 29.8*  MCV 90.1 88.4  PLT 180 145*   Cardiac Enzymes: No results for input(s): CKTOTAL, CKMB, CKMBINDEX, TROPONINI in the last 168 hours. BNP: Invalid input(s): POCBNP CBG:  Recent Labs Lab 01/19/16 0154 01/19/16 0431 01/19/16 0636 01/19/16 0756 01/19/16 1211  GLUCAP 282* 262* 250* 257* 398*   D-Dimer No results for input(s): DDIMER in the last 72 hours. Hgb A1c No results for input(s): HGBA1C in the last 72 hours. Lipid Profile No results for input(s): CHOL, HDL, LDLCALC, TRIG, CHOLHDL, LDLDIRECT in the last 72 hours. Thyroid function studies No results for input(s):  TSH, T4TOTAL, T3FREE, THYROIDAB in the last 72 hours.  Invalid input(s): FREET3 Anemia work up No results for input(s): VITAMINB12, FOLATE, FERRITIN, TIBC, IRON, RETICCTPCT in the last 72 hours. Microbiology Recent Results (from the past 240 hour(s))  C difficile quick scan w PCR reflex     Status: None   Collection Time: 01/19/16  3:56 AM  Result Value Ref Range Status   C Diff antigen NEGATIVE NEGATIVE Final   C Diff toxin NEGATIVE NEGATIVE Final   C Diff interpretation No C. difficile detected.  Final     Discharge Instructions:   Discharge Instructions    Call MD for:  extreme fatigue    Complete by:  As directed    Call MD for:  persistant nausea and vomiting    Complete by:  As directed    Call MD for:  severe uncontrolled pain    Complete by:  As directed    Diet Carb Modified    Complete by:  As directed    Increase activity slowly    Complete by:  As directed        Medication List    TAKE these medications   acetaminophen 500 MG tablet Commonly known as:  TYLENOL Take 500-1,000  mg by mouth every 6 (six) hours as needed for mild pain, moderate pain or headache.   ALPRAZolam 1 MG tablet Commonly known as:  XANAX Take 2 mg by mouth at bedtime.   amitriptyline 50 MG tablet Commonly known as:  ELAVIL Take 100 mg by mouth at bedtime.   calcium carbonate 1500 (600 Ca) MG Tabs tablet Commonly known as:  OSCAL Take 1,200 mg of elemental calcium by mouth daily with breakfast.   cholestyramine 4 g packet Commonly known as:  QUESTRAN Take 4 g by mouth 4 (four) times daily.   CREON 36000 UNITS Cpep capsule Generic drug:  lipase/protease/amylase Take 144,000-216,000 Units by mouth 5 (five) times daily. Pt takes six capsules three times daily with meals and four capsules two times daily with snacks.   cyclobenzaprine 10 MG tablet Commonly known as:  FLEXERIL Take 10 mg by mouth 2 (two) times daily.   dicyclomine 10 MG capsule Commonly known as:  BENTYL Take  10 mg by mouth 3 (three) times daily as needed for spasms.   DIGESTIVE ADVANTAGE GUMMIES PO Take 2 each by mouth 2 (two) times daily.   diphenoxylate-atropine 2.5-0.025 MG tablet Commonly known as:  LOMOTIL Take 1 tablet by mouth 4 (four) times daily as needed for diarrhea or loose stools. Notes to patient:  Last had 01/19/16 @ 0817   feeding supplement (GLUCERNA SHAKE) Liqd Take 237 mLs by mouth 6 (six) times daily.   gabapentin 300 MG capsule Commonly known as:  NEURONTIN Take 300 mg by mouth 3 (three) times daily.   GLUCAGON EMERGENCY 1 MG injection Generic drug:  glucagon Inject 1 mg into the vein once as needed (for severe hypoglycemia).   hydrOXYzine 25 MG tablet Commonly known as:  ATARAX/VISTARIL Take 25 mg by mouth 3 (three) times daily as needed for anxiety.   hyoscyamine 0.125 MG SL tablet Commonly known as:  LEVSIN SL Place 0.125 mg under the tongue every 4 (four) hours as needed for cramping or pain.   insulin pump Soln Pt uses Humalog -- 10 units once daily and 0-5 units with meals.   multivitamin with minerals Tabs tablet Take 2 tablets by mouth daily.   omeprazole 40 MG capsule Commonly known as:  PRILOSEC Take 40 mg by mouth daily.   oxyCODONE-acetaminophen 7.5-325 MG tablet Commonly known as:  PERCOCET Take 1 tablet by mouth every 4 (four) hours as needed for severe pain. Notes to patient:  Last had 01/19/16 @ 1234   promethazine 25 MG suppository Commonly known as:  PHENERGAN Place 25 mg rectally every 6 (six) hours as needed for nausea or vomiting.   promethazine 25 MG tablet Commonly known as:  PHENERGAN Take 25 mg by mouth every 6 (six) hours as needed for nausea or vomiting.   saccharomyces boulardii 250 MG capsule Commonly known as:  FLORASTOR Take 1 capsule (250 mg total) by mouth 2 (two) times daily.   traZODone 50 MG tablet Commonly known as:  DESYREL Take 50 mg by mouth at bedtime.   vitamin B-12 1000 MCG tablet Commonly known  as:  CYANOCOBALAMIN Take 1,000 mcg by mouth 2 (two) times daily.      Follow-up Information    Judi Cong, MD Follow up on 01/21/2016.   Specialty:  Endocrinology Why:  10:30 a.m. Contact information: [place] [address] [phone]            Time coordinating discharge: Greater than 35 minutes.  Signed:  RAMA,CHRISTINA  Pager [phone] [facility] 01/19/2016, 1:28 PM

## 2016-01-19 NOTE — Discharge Instructions (Signed)
Hyperglycemia Hyperglycemia is when the sugar (glucose) level in your blood is too high. It may not cause symptoms. If you do have symptoms, they may include warning signs, such as:  Feeling more thirsty than normal.  Hunger.  Feeling tired.  Needing to pee (urinate) more than normal.  Blurry eyesight (vision). You may get other symptoms as it gets worse, such as:  Dry mouth.  Not being hungry (loss of appetite).  Fruity-smelling breath.  Weakness.  Weight gain or loss that is not planned. Weight loss may be fast.  A tingling or numb feeling in your hands or feet.  Headache.  Skin that does not bounce back quickly when it is lightly pinched and released (poor skin turgor).  Pain in your belly (abdomen).  Cuts or bruises that heal slowly. High blood sugar can happen to people who do or do not have diabetes. High blood sugar can happen slowly or quickly, and it can be an emergency. Follow these instructions at home: General instructions  Take over-the-counter and prescription medicines only as told by your doctor.  Do not use products that contain nicotine or tobacco, such as cigarettes and e-cigarettes. If you need help quitting, ask your doctor.  Limit alcohol intake to no more than 1 drink per day for nonpregnant women and 2 drinks per day for men. One drink equals 12 oz of beer, 5 oz of wine, or 1 oz of hard liquor.  Manage stress. If you need help with this, ask your doctor.  Keep all follow-up visits as told by your doctor. This is important. Eating and drinking  Stay at a healthy weight.  Exercise regularly, as told by your doctor.  Drink enough fluid, especially when you:  Exercise.  Get sick.  Are in hot temperatures.  Eat healthy foods, such as:  Low-fat (lean) proteins.  Complex carbs (complex carbohydrates), such as whole wheat bread or brown rice.  Fresh fruits and vegetables.  Low-fat dairy products.  Healthy fats.  Drink enough  fluid to keep your pee (urine) clear or pale yellow. If you have diabetes:  Make sure you know the symptoms of hyperglycemia.  Follow your diabetes management plan, as told by your doctor. Make sure you:  Take insulin and medicines as told.  Follow your exercise plan.  Follow your meal plan. Eat on time. Do not skip meals.  Check your blood sugar as often as told. Make sure to check before and after exercise. If you exercise longer or in a different way than you normally do, check your blood sugar more often.  Follow your sick day plan whenever you cannot eat or drink normally. Make this plan ahead of time with your doctor.  Share your diabetes management plan with people in your workplace, school, and household.  Check your urine for ketones when you are ill and as told by your doctor.  Carry a card or wear jewelry that says that you have diabetes. Contact a doctor if:  Your blood sugar level is higher than 240 mg/dL (13.3 mmol/L) for 2 days in a row.  You have problems keeping your blood sugar in your target range.  High blood sugar happens often for you. Get help right away if:  You have trouble breathing.  You have a change in how you think, feel, or act (mental status).  You feel sick to your stomach (nauseous), and that feeling does not go away.  You cannot stop throwing up (vomiting). These symptoms may be an   emergency. Do not wait to see if the symptoms will go away. Get medical help right away. Call your local emergency services (911 in the [place] Do not drive yourself to the hospital.  Summary  Hyperglycemia is when the sugar (glucose) level in your blood is too high.  High blood sugar can happen to people who do or do not have diabetes.  Make sure you drink enough fluids, eat healthy foods, and exercise regularly.  Contact your doctor if you have problems keeping your blood sugar in your target range. This information is not intended to replace advice given  to you by your health care provider. Make sure you discuss any questions you have with your health care provider. Document Released: 12/06/2008 Document Revised: 10/27/2015 Document Reviewed: 10/27/2015 Elsevier Interactive Patient Education  2017 [employer]  

## 2016-01-19 NOTE — Progress Notes (Signed)
Clinical Social Work consult: referred for medication assistance - "difficulty affording one med".   Deferred to Case Management at this time.  Inappropriate CSW referral.  If CSW needs arise, please re-consult.   Raynaldo Opitz, [facility] Clinical Social Worker cell #: [phone]

## 2016-01-21 ENCOUNTER — Other Ambulatory Visit: Payer: Self-pay | Admitting: Family Medicine

## 2016-02-05 ENCOUNTER — Telehealth: Payer: Self-pay | Admitting: Family Medicine

## 2016-02-05 ENCOUNTER — Other Ambulatory Visit: Payer: Self-pay | Admitting: Family Medicine

## 2016-02-05 NOTE — Telephone Encounter (Signed)
Patient called the office to speak with PCP regarding the the abscess that she has in her mouth. Pt has contacted her dentist but there aren't any appts available. Pt is in pain and wants to know if an antibiotic can be called in. Flagyl or Vancomycin. Pt needs rx to be called in to [facility] [place].   Thank you.

## 2016-02-06 MED ORDER — AMOXICILLIN 500 MG PO CAPS: 500.0000 mg | ORAL_CAPSULE | Freq: Three times a day (TID) | ORAL | 0 refills | Status: DC

## 2016-02-06 NOTE — Telephone Encounter (Signed)
I am unable to prescribe any of the antibiotics she is suggesting as those are not the antibiotics of choice for dental abscesses. I can prescribe a more appropriate antibiotic if she would like.

## 2016-02-06 NOTE — Telephone Encounter (Signed)
Done

## 2016-02-06 NOTE — Addendum Note (Signed)
Addended by: Arnoldo Morale on: 02/06/2016 04:56 PM   Modules accepted: Orders

## 2016-02-06 NOTE — Telephone Encounter (Signed)
Writer spoke with patient and she states the only reason she chose those two antibiotics is because she is prone to c-diff.  Patient states she would be very grateful if you sent the appropriate antibiotic to the [facility] today.  Her bday is Tuesday and she doesn't want to be in pain.

## 2016-02-21 IMAGING — CR DG ABDOMEN ACUTE W/ 1V CHEST
4 series · 4 of 4 positions shown · non-contrast
Comparison: Chest radiograph performed 03/03/2014, and CT of the
abdomen and pelvis from 05/30/2014

CLINICAL DATA: Acute onset of nausea, vomiting and diarrhea.
Generalized abdominal pain and weakness. Initial encounter.

EXAM:
DG ABDOMEN ACUTE W/ 1V CHEST

[w chest pa]
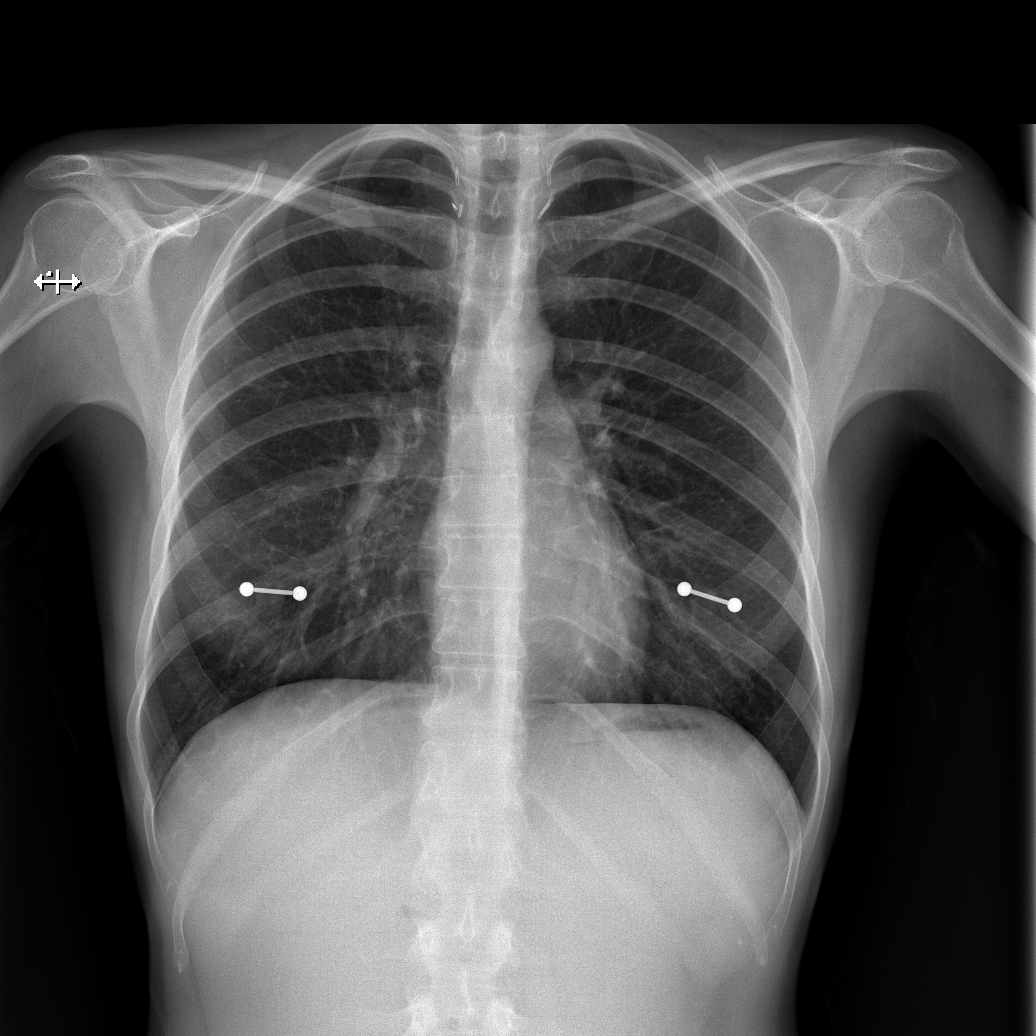

[w abdomen upright]
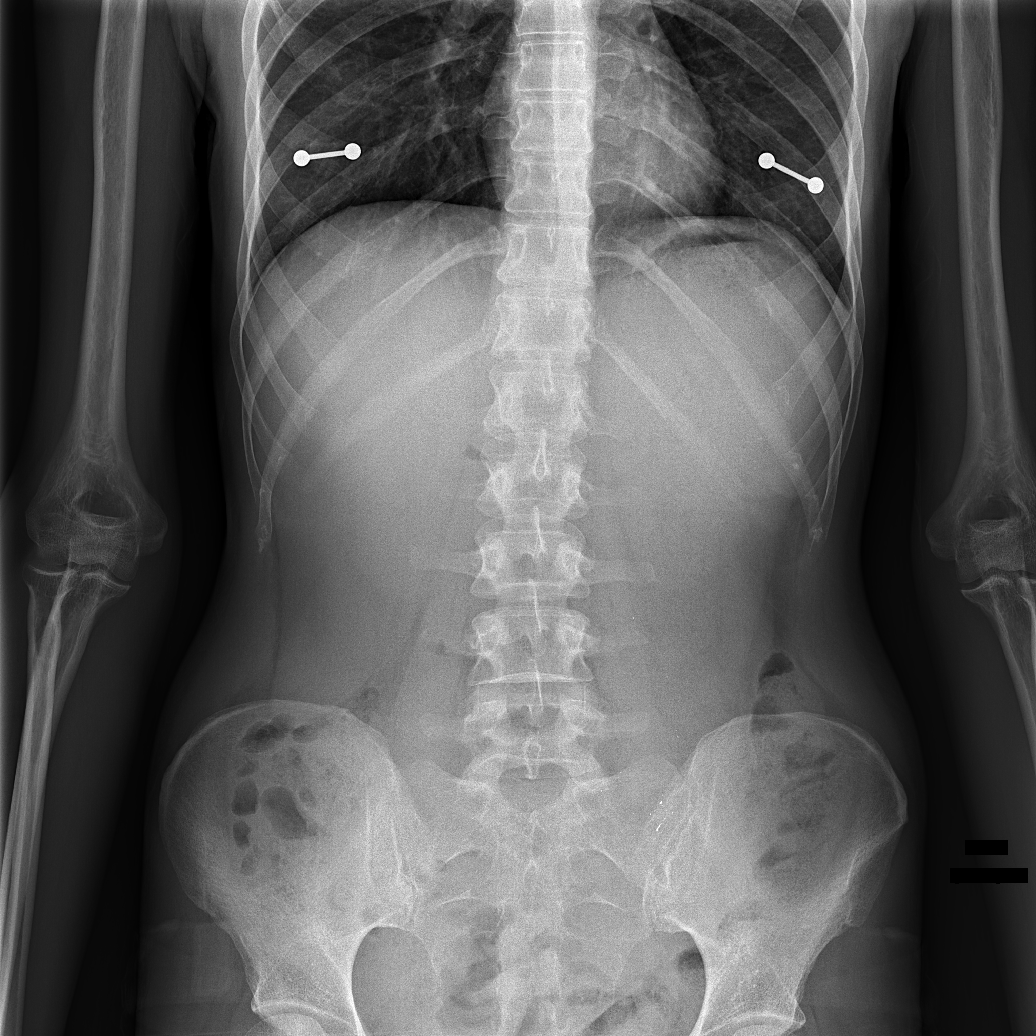

[t abdomen supine (1 of 2)]
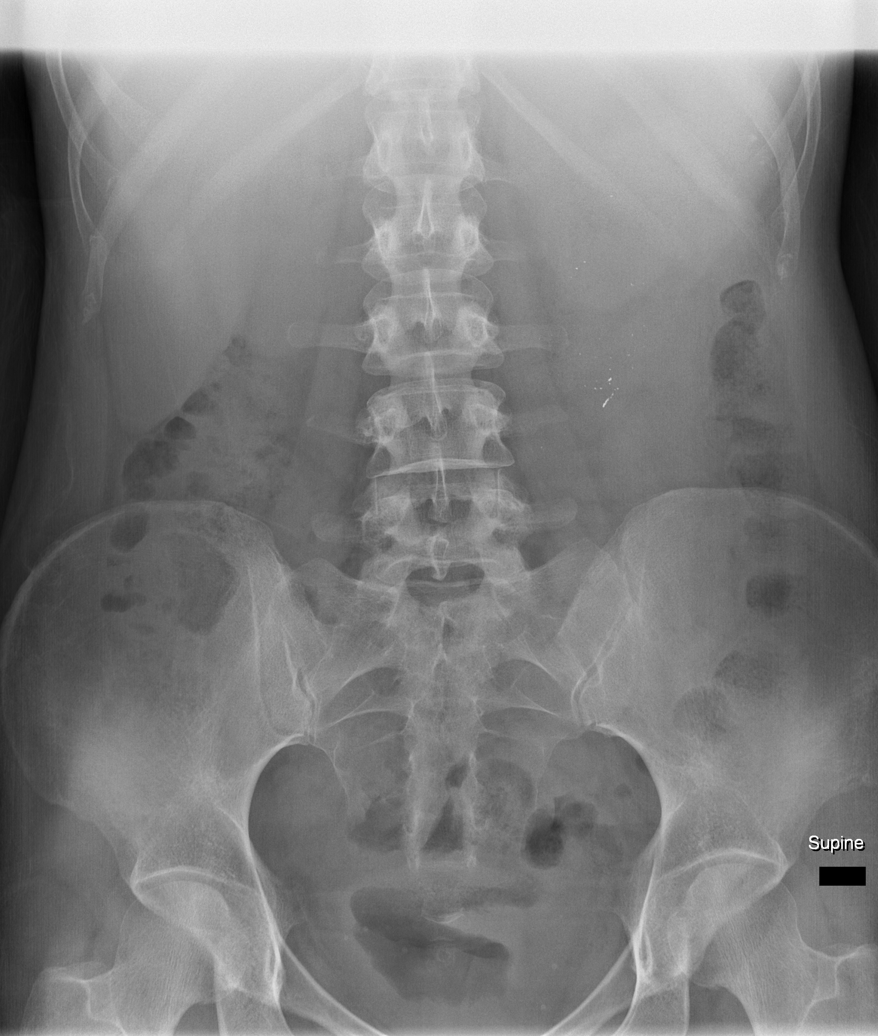

[t abdomen supine (2 of 2)]
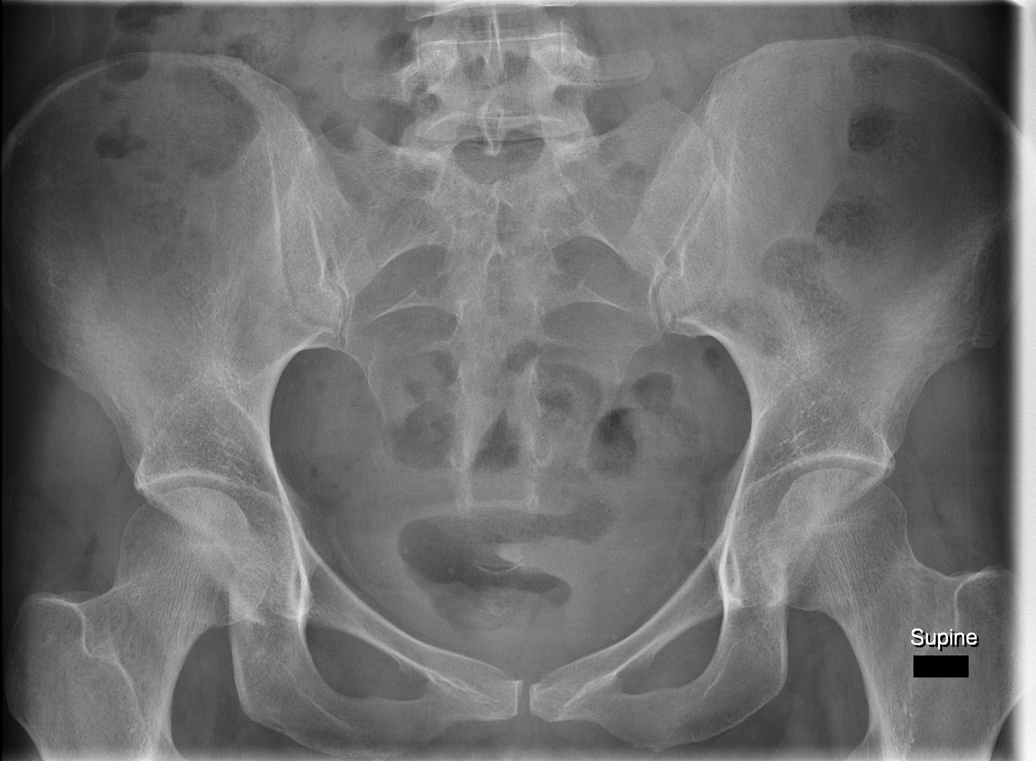

[4 of 4 positions shown; findings below may reference images not displayed]

FINDINGS: The lungs are well-aerated and clear. There is no evidence of focal
opacification, pleural effusion or pneumothorax. The
cardiomediastinal silhouette is within normal limits. Bilateral
metallic nipple piercings are noted. Clips are seen overlying the
lung apices bilaterally.

The visualized bowel gas pattern is unremarkable. Scattered stool
and air are seen within the colon; there is no evidence of small
bowel dilatation to suggest obstruction. No free intra-abdominal air
is identified on the provided upright view.

No acute osseous abnormalities are seen; the sacroiliac joints are
unremarkable in appearance.
IMPRESSION: 1. Unremarkable bowel gas pattern; no free intra-abdominal air seen.
Small amount of stool noted in the colon.
2. No acute cardiopulmonary process seen.

## 2016-03-02 ENCOUNTER — Other Ambulatory Visit: Payer: Self-pay | Admitting: Family Medicine

## 2016-04-02 IMAGING — CR DG SMALL BOWEL
4 series · 4 of 4 positions shown · non-contrast
Comparison: CT of the abdomen and pelvis 06/21/2014.

CLINICAL DATA: 33-year-old female with diarrhea secondary to
malabsorption. History of diabetes. Abdominal pain and nausea.

EXAM:
SMALL BOWEL SERIES
TECHNIQUE: Following ingestion of thin barium, serial small bowel images were
obtained including spot views of the terminal ileum.
FLUOROSCOPY TIME:  If the device does not provide the exposure
index:
Fluoroscopy Time (in minutes and seconds):  3 minutes and 13 seconds
Number of Acquired Images:  13

[t abdomen supine (1 of 4)]
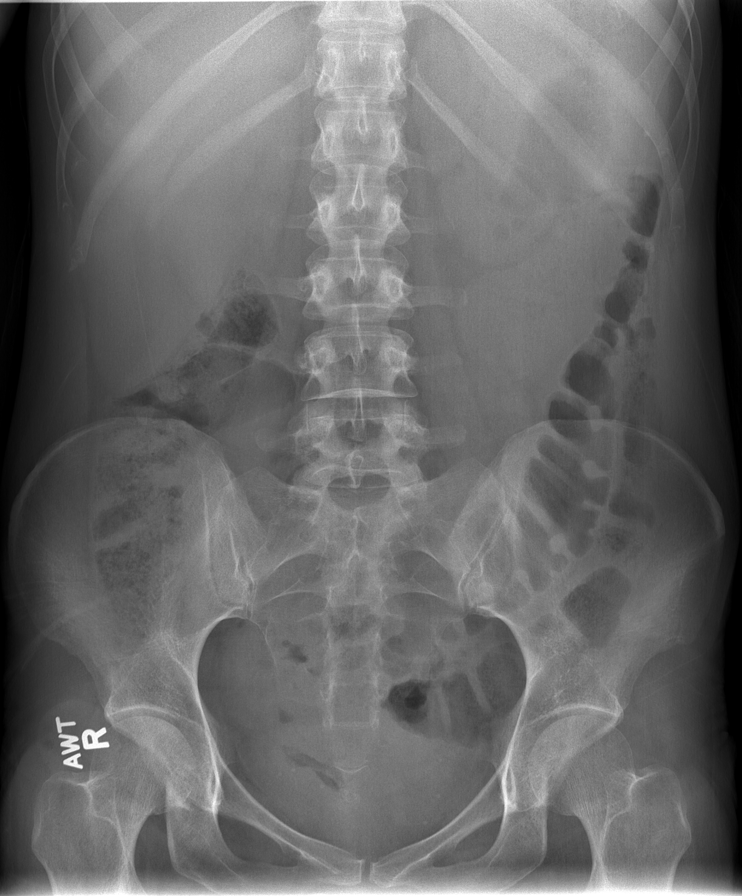

[t abdomen supine (2 of 4)]
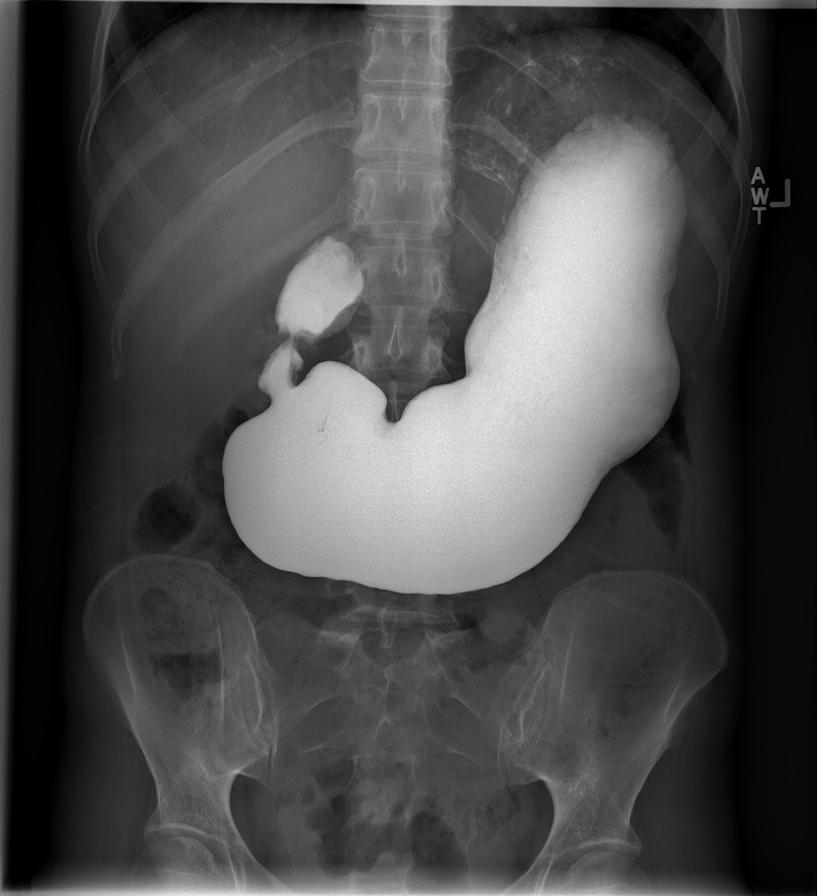

[t abdomen supine (3 of 4)]
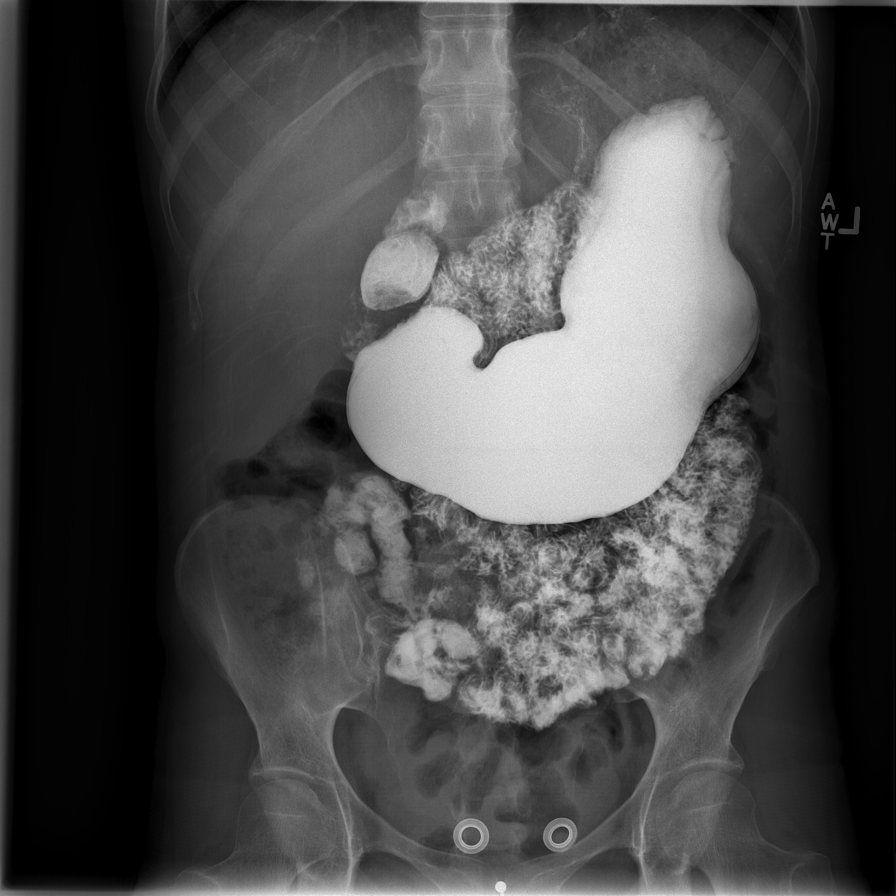

[t abdomen supine (4 of 4)]
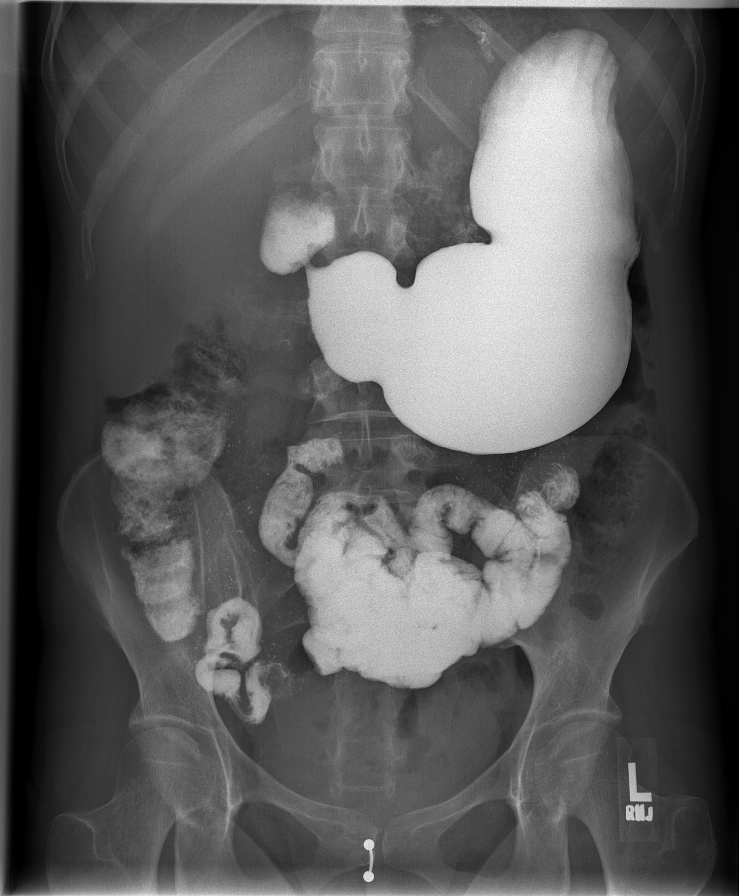

[4 of 4 positions shown; findings below may reference images not displayed]

FINDINGS: Initial KUB demonstrated gas throughout the colon and distal rectum.
No pathologic dilatation of small bowel. Subsequently, small bowel
follow through was performed and terminal ileum was visualized at 1
hour and 20 minutes. Terminal ileum was grossly normal in
appearance. Multiple spot compression views of the small bowel
demonstrated no definite mass, mucosal abnormality, or signs of
obstruction.
IMPRESSION: 1. Normal small bowel follow-through.

## 2016-04-20 ENCOUNTER — Telehealth: Payer: Self-pay | Admitting: Family Medicine

## 2016-04-20 NOTE — Telephone Encounter (Signed)
Pt. Called stating that she went to [facility] and the doctor was going to do a nerve block. The pt. Has DM and the doctor stated that it would be a risk b/c she has DM. The doctor stated that she could have a rib brace and pt. Would like to know if she can get an Rx for it. Please f/u with pt.

## 2016-04-21 NOTE — Telephone Encounter (Signed)
The doctor who recommends a rib brace would be in a better position to justify the need and could write the prescription for her.

## 2016-04-22 NOTE — Telephone Encounter (Signed)
Writer called patient back and passed along Dr. Johna Sheriff recommendations to have patient obtain a prescription for the rib brace from the physician who recommended it.  Patient stated understanding.

## 2016-04-23 ENCOUNTER — Observation Stay (HOSPITAL_COMMUNITY)
Admission: EM | Admit: 2016-04-23 | Discharge: 2016-04-24 | Disposition: A | Discharge status: Home / Self Care | Payer: Medicaid Other | Attending: Internal Medicine | Admitting: Internal Medicine

## 2016-04-23 ENCOUNTER — Encounter (HOSPITAL_COMMUNITY): Payer: Self-pay | Admitting: Emergency Medicine

## 2016-04-23 DIAGNOSIS — E1010 Type 1 diabetes mellitus with ketoacidosis without coma: Secondary | ICD-10-CM | POA: Diagnosis not present

## 2016-04-23 DIAGNOSIS — E875 Hyperkalemia: Secondary | ICD-10-CM | POA: Diagnosis present

## 2016-04-23 DIAGNOSIS — Z79899 Other long term (current) drug therapy: Secondary | ICD-10-CM | POA: Insufficient documentation

## 2016-04-23 DIAGNOSIS — K529 Noninfective gastroenteritis and colitis, unspecified: Secondary | ICD-10-CM | POA: Insufficient documentation

## 2016-04-23 DIAGNOSIS — E1110 Type 2 diabetes mellitus with ketoacidosis without coma: Secondary | ICD-10-CM | POA: Diagnosis present

## 2016-04-23 DIAGNOSIS — F419 Anxiety disorder, unspecified: Secondary | ICD-10-CM | POA: Diagnosis not present

## 2016-04-23 DIAGNOSIS — Z87891 Personal history of nicotine dependence: Secondary | ICD-10-CM | POA: Insufficient documentation

## 2016-04-23 DIAGNOSIS — Z794 Long term (current) use of insulin: Secondary | ICD-10-CM | POA: Diagnosis not present

## 2016-04-23 DIAGNOSIS — E871 Hypo-osmolality and hyponatremia: Secondary | ICD-10-CM | POA: Insufficient documentation

## 2016-04-23 DIAGNOSIS — K3184 Gastroparesis: Secondary | ICD-10-CM | POA: Diagnosis not present

## 2016-04-23 DIAGNOSIS — R109 Unspecified abdominal pain: Secondary | ICD-10-CM | POA: Diagnosis present

## 2016-04-23 DIAGNOSIS — E1043 Type 1 diabetes mellitus with diabetic autonomic (poly)neuropathy: Secondary | ICD-10-CM | POA: Diagnosis not present

## 2016-04-23 LAB — URINALYSIS, ROUTINE W REFLEX MICROSCOPIC
BACTERIA UA: NONE SEEN
Bilirubin Urine: NEGATIVE
HGB URINE DIPSTICK: NEGATIVE
KETONES UR: 80 mg/dL — AB
LEUKOCYTES UA: NEGATIVE
NITRITE: NEGATIVE
PH: 5 (ref 5.0–8.0)
PROTEIN: NEGATIVE mg/dL
Specific Gravity, Urine: 1.021 (ref 1.005–1.030)

## 2016-04-23 LAB — BASIC METABOLIC PANEL
ANION GAP: 39 — AB (ref 5–15)
ANION GAP: 20 — AB (ref 5–15)
BUN: 11 mg/dL (ref 6–20)
BUN: 10 mg/dL (ref 6–20)
CALCIUM: 8.9 mg/dL (ref 8.9–10.3)
CO2: 11 mmol/L — ABNORMAL LOW (ref 22–32)
CO2: 15 mmol/L — AB (ref 22–32)
Calcium: 12.6 mg/dL — ABNORMAL HIGH (ref 8.9–10.3)
Chloride: 80 mmol/L — ABNORMAL LOW (ref 101–111)
Chloride: 95 mmol/L — ABNORMAL LOW (ref 101–111)
Creatinine, Ser: 0.97 mg/dL (ref 0.44–1.00)
Creatinine, Ser: 0.92 mg/dL (ref 0.44–1.00)
GFR calc Af Amer: >60 mL/min (ref >60)
GFR calc non Af Amer: >60 mL/min (ref >60)
GLUCOSE: 579 mg/dL — AB (ref 65–99)
GLUCOSE: 401 mg/dL — AB (ref 65–99)
POTASSIUM: 5.2 mmol/L — AB (ref 3.5–5.1)
POTASSIUM: 4 mmol/L (ref 3.5–5.1)
Sodium: 130 mmol/L — ABNORMAL LOW (ref 135–145)
Sodium: 130 mmol/L — ABNORMAL LOW (ref 135–145)

## 2016-04-23 LAB — BLOOD GAS, VENOUS
ACID-BASE DEFICIT: 15.5 mmol/L — AB (ref 0.0–2.0)
BICARBONATE: 10.1 mmol/L — AB (ref 20.0–28.0)
O2 SAT: 84.2 %
Patient temperature: 98.6
pCO2, Ven: 23.5 mmHg — ABNORMAL LOW (ref 44.0–60.0)
pH, Ven: 7.255 (ref 7.250–7.430)
pO2, Ven: 59.5 mmHg — ABNORMAL HIGH (ref 32.0–45.0)

## 2016-04-23 LAB — CBC
HEMATOCRIT: 36.5 % (ref 36.0–46.0)
HEMOGLOBIN: 12.3 g/dL (ref 12.0–15.0)
MCH: 32.5 pg (ref 26.0–34.0)
MCHC: 33.7 g/dL (ref 30.0–36.0)
MCV: 96.3 fL (ref 78.0–100.0)
Platelets: 359 10*3/uL (ref 150–400)
RBC: 3.79 MIL/uL — AB (ref 3.87–5.11)
RDW: 17.2 % — ABNORMAL HIGH (ref 11.5–15.5)
WBC: 11 10*3/uL — AB (ref 4.0–10.5)

## 2016-04-23 LAB — CBG MONITORING, ED
GLUCOSE-CAPILLARY: 167 mg/dL — AB (ref 65–99)
GLUCOSE-CAPILLARY: 317 mg/dL — AB (ref 65–99)
GLUCOSE-CAPILLARY: 381 mg/dL — AB (ref 65–99)
Glucose-Capillary: 556 mg/dL (ref 65–99)
Glucose-Capillary: 585 mg/dL (ref 65–99)
Glucose-Capillary: 449 mg/dL — ABNORMAL HIGH (ref 65–99)
Glucose-Capillary: 386 mg/dL — ABNORMAL HIGH (ref 65–99)
Glucose-Capillary: 261 mg/dL — ABNORMAL HIGH (ref 65–99)
Glucose-Capillary: 248 mg/dL — ABNORMAL HIGH (ref 65–99)

## 2016-04-23 LAB — I-STAT BETA HCG BLOOD, ED (MC, WL, AP ONLY): I-stat hCG, quantitative: <5 m[IU]/mL (ref <5)

## 2016-04-23 MED ORDER — ONDANSETRON HCL 4 MG/2ML IJ SOLN
4.0000 mg | Freq: Once | INTRAMUSCULAR | Status: AC
  Administered 2016-04-23: 4 mg via INTRAVENOUS
  Filled 2016-04-23: qty 2

## 2016-04-23 MED ORDER — DIPHENHYDRAMINE HCL 25 MG PO CAPS: 25.0000 mg | ORAL_CAPSULE | Freq: Four times a day (QID) | ORAL | Status: DC | PRN

## 2016-04-23 MED ORDER — DEXTROSE-NACL 5-0.45 % IV SOLN: INTRAVENOUS | Status: DC

## 2016-04-23 MED ORDER — HYDROMORPHONE HCL 1 MG/ML IJ SOLN
1.0000 mg | Freq: Once | INTRAMUSCULAR | Status: AC
  Administered 2016-04-23: 1 mg via INTRAVENOUS
  Filled 2016-04-23: qty 1

## 2016-04-23 MED ORDER — METOCLOPRAMIDE HCL 5 MG/ML IJ SOLN
10.0000 mg | Freq: Once | INTRAMUSCULAR | Status: AC
  Administered 2016-04-23: 10 mg via INTRAVENOUS
  Filled 2016-04-23: qty 2

## 2016-04-23 MED ORDER — PROMETHAZINE HCL 25 MG/ML IJ SOLN
10.0000 mg | Freq: Four times a day (QID) | INTRAMUSCULAR | Status: DC | PRN
  Administered 2016-04-23 – 2016-04-24 (×3): 10 mg via INTRAVENOUS
  Filled 2016-04-23 (×2): qty 1

## 2016-04-23 MED ORDER — ONDANSETRON HCL 4 MG/2ML IJ SOLN
4.0000 mg | Freq: Four times a day (QID) | INTRAMUSCULAR | Status: DC | PRN
  Filled 2016-04-23: qty 2

## 2016-04-23 MED ORDER — INSULIN REGULAR BOLUS VIA INFUSION
10.0000 [IU] | Freq: Once | INTRAVENOUS | Status: DC
  Filled 2016-04-23: qty 10

## 2016-04-23 MED ORDER — SODIUM CHLORIDE 0.9 % IV BOLUS (SEPSIS)
1000.0000 mL | Freq: Once | INTRAVENOUS | Status: AC
  Administered 2016-04-23: 1000 mL via INTRAVENOUS

## 2016-04-23 MED ORDER — SODIUM CHLORIDE 0.9 % IV BOLUS (SEPSIS)
2000.0000 mL | Freq: Once | INTRAVENOUS | Status: AC
  Administered 2016-04-23: 2000 mL via INTRAVENOUS

## 2016-04-23 MED ORDER — SODIUM CHLORIDE 0.9 % IV SOLN: INTRAVENOUS | Status: DC

## 2016-04-23 MED ORDER — INSULIN REGULAR HUMAN 100 UNIT/ML IJ SOLN
INTRAMUSCULAR | Status: DC
  Administered 2016-04-23: 5.3 [IU]/h via INTRAVENOUS
  Filled 2016-04-23: qty 2.5

## 2016-04-23 MED ORDER — OXYCODONE-ACETAMINOPHEN 7.5-325 MG PO TABS
1.0000 | ORAL_TABLET | ORAL | Status: DC | PRN
  Administered 2016-04-23 – 2016-04-24 (×2): 1 via ORAL
  Filled 2016-04-23 (×2): qty 1

## 2016-04-23 NOTE — ED Notes (Signed)
Unable to collect labs at this time due to PT using restroom

## 2016-04-23 NOTE — H&P (Addendum)
History and Physical    [name] [id number] DOB: 06-15-1980 DOA: 04/23/2016  PCP: Arnoldo Morale, MD  Patient coming from: home   Chief Complaint: abdominal pain, nausea and vomiting  HPI: [name] is a 36 y.o. female with medical history significant of type 1 diabetes comes into the hospital complaining of nausea, vomiting, abdominal pain and elevated blood glucose. She denies any fever or chills. She called EMS her sugar was found in the 400s. She denies any cough shortness of breath or sores no recent change in her medications no new medications. No recent dental procedures no mouth pain.  ED Course:  She was started on aggressive IV fluid and the insulin drip and so we are consulted for further evaluation. She is mildly hyponatremic and hyperkalemic  Review of Systems: As per HPI otherwise 10 point review of systems negative.    Past Medical History:  Diagnosis Date  . Anxiety   . Diabetes mellitus without complication ([place])    TYPE I    Past Surgical History:  Procedure Laterality Date  . BACK SURGERY     age 16  . COLONOSCOPY WITH PROPOFOL N/A 12/02/2015   Procedure: COLONOSCOPY WITH PROPOFOL;  Surgeon: Lollie Sails, MD;  Location: [facility] ENDOSCOPY;  Service: Endoscopy;  Laterality: N/A;  . COLONOSCOPY WITH PROPOFOL N/A 01/02/2016   Procedure: COLONOSCOPY WITH PROPOFOL;  Surgeon: Lollie Sails, MD;  Location: [facility] ENDOSCOPY;  Service: Endoscopy;  Laterality: N/A;  . ESOPHAGOGASTRODUODENOSCOPY (EGD) WITH PROPOFOL N/A 06/27/2014   Procedure: ESOPHAGOGASTRODUODENOSCOPY (EGD) WITH PROPOFOL;  Surgeon: Milus Banister, MD;  Location: [facility] ENDOSCOPY;  Service: Endoscopy;  Laterality: N/A;  . FLEXIBLE SIGMOIDOSCOPY N/A 06/24/2014   Procedure: FLEXIBLE SIGMOIDOSCOPY;  Surgeon: Milus Banister, MD;  Location: [facility] ENDOSCOPY;  Service: Endoscopy;  Laterality: N/A;  . LAPAROSCOPY  02/2002, 06/2010   laparoscopy with lysis pelvic adhesions for pelvic pain  . ROOT CANAL   10/10/12  . TOOTH EXTRACTION       reports that she quit smoking about 16 years ago. Her smoking use included Cigarettes. She smoked 0.50 packs per day. She has quit using smokeless tobacco. She reports that she does not drink alcohol or use drugs.  Allergies  Allergen Reactions  . Morphine Anaphylaxis, Itching and Other (See Comments)    Pt states that her lips turn blue.    Elyse Hsu [Shellfish Allergy] Anaphylaxis  . Betamethasone Swelling and Other (See Comments)    Reaction:  Facial swelling  . Diphenhydramine Hcl Nausea Only and Other (See Comments)    Pt states that she is only allergic to the IV form.    . Haloperidol And Related Itching  . Ketorolac Itching  . Latex Rash  . Other Rash and Other (See Comments)    Pt states that she is allergic to sunscreen and grass.     Family History  Problem Relation Age of Onset  . Diabetes Maternal Grandmother   . Cancer Maternal Grandmother     Leukemia  . Cancer Maternal Grandfather     Small cell lung cancer    Prior to Admission medications   Medication Sig Start Date End Date Taking? Authorizing Provider  acetaminophen (TYLENOL) 500 MG tablet Take 500 mg by mouth every 6 (six) hours as needed for mild pain, moderate pain or headache.    Yes Historical Provider, MD  ALPRAZolam Duanne Moron) 1 MG tablet Take 2 mg by mouth at bedtime as needed for sleep.    Yes Historical  Provider, MD  amitriptyline (ELAVIL) 50 MG tablet Take 100 mg by mouth at bedtime as needed for sleep.    Yes Historical Provider, MD  calcium carbonate (OSCAL) 1500 (600 Ca) MG TABS tablet Take 1,200 mg of elemental calcium by mouth daily with breakfast.   Yes Historical Provider, MD  cholestyramine (QUESTRAN) 4 g packet Take 4 g by mouth 4 (four) times daily.   Yes Historical Provider, MD  CREON 36000 UNITS CPEP capsule Take 144,000-216,000 Units by mouth 5 (five) times daily. Pt takes six capsules three times daily with meals and four capsules two times daily with  snacks.   Yes Historical Provider, MD  cyclobenzaprine (FLEXERIL) 10 MG tablet Take 10 mg by mouth 2 (two) times daily.    Yes Historical Provider, MD  dicyclomine (BENTYL) 10 MG capsule Take 10 mg by mouth 3 (three) times daily as needed for spasms.    Yes Historical Provider, MD  feeding supplement, GLUCERNA SHAKE, (GLUCERNA SHAKE) LIQD Take 237 mLs by mouth 6 (six) times daily.   Yes Historical Provider, MD  gabapentin (NEURONTIN) 300 MG capsule Take 300 mg by mouth 3 (three) times daily.   Yes Historical Provider, MD  glucagon (GLUCAGON EMERGENCY) 1 MG injection Inject 1 mg into the vein once as needed (for severe hypoglycemia).   Yes Historical Provider, MD  hydrOXYzine (ATARAX/VISTARIL) 25 MG tablet Take 25 mg by mouth 3 (three) times daily as needed for anxiety.   Yes Historical Provider, MD  hyoscyamine (LEVSIN SL) 0.125 MG SL tablet Place 0.125 mg under the tongue every 4 (four) hours as needed for cramping or pain.   Yes Historical Provider, MD  Insulin Human (INSULIN PUMP) SOLN Pt uses Humalog -- 10 units once daily and 0-5 units with meals.   Yes Historical Provider, MD  Multiple Vitamin (MULTIVITAMIN WITH MINERALS) TABS Take 2 tablets by mouth daily.    Yes Historical Provider, MD  omeprazole (PRILOSEC) 40 MG capsule Take 40 mg by mouth daily.    Yes Historical Provider, MD  oxyCODONE-acetaminophen (PERCOCET) 7.5-325 MG tablet Take 1 tablet by mouth every 4 (four) hours as needed for severe pain.    Yes Historical Provider, MD  Probiotic Product (DIGESTIVE ADVANTAGE GUMMIES PO) Take 2 each by mouth 2 (two) times daily.    Yes Historical Provider, MD  promethazine (PHENERGAN) 25 MG suppository PLACE 1 SUPPOSITORY RECTALLY EVERY 6 (SIX) HOURS AS NEEDED FOR NAUSEA OR VOMITING. 03/02/16  Yes Arnoldo Morale, MD  promethazine (PHENERGAN) 25 MG tablet Take 25 mg by mouth every 6 (six) hours as needed for nausea or vomiting.   Yes Historical Provider, MD  saccharomyces boulardii (FLORASTOR) 250 MG  capsule Take 1 capsule (250 mg total) by mouth 2 (two) times daily. 07/06/14  Yes Thurnell Lose, MD  traZODone (DESYREL) 50 MG tablet Take 50 mg by mouth at bedtime as needed for sleep.    Yes Historical Provider, MD  vitamin B-12 (CYANOCOBALAMIN) 1000 MCG tablet Take 1,000 mcg by mouth 2 (two) times daily.   Yes Historical Provider, MD    Physical Exam: Vitals:   04/23/16 1426 04/23/16 1431  BP:  106/78  Pulse:  (!) 135  Resp:  16  Temp:  98.3 F (36.8 C)  TempSrc:  Oral  SpO2: 95% 100%  Weight:  45.4 kg (100 lb)    Constitutional: NAD, calm, comfortable Vitals:   04/23/16 1426 04/23/16 1431  BP:  106/78  Pulse:  (!) 135  Resp:  16  Temp:  98.3 F (36.8 C)  TempSrc:  Oral  SpO2: 95% 100%  Weight:  45.4 kg (100 lb)   Eyes: PERRL, lids and conjunctivae normal ENMT: Mucous membranes are moist. Posterior pharynx clear of any exudate or lesions.Normal dentition. Multiple piercing in her face. Neck: normal, supple, no masses, no thyromegaly Respiratory: clear to auscultation bilaterally, no wheezing, no crackles. Normal respiratory effort. No accessory muscle use.  Cardiovascular: Regular rate and rhythm, no murmurs / rubs / gallops. No extremity edema. 2+ pedal pulses. No carotid bruits.  Abdomen: Tender mild, no masses palpated. No hepatosplenomegaly. Bowel sounds positive.  Musculoskeletal: no clubbing / cyanosis. No joint deformity upper and lower extremities. Good ROM, no contractures. Normal muscle tone.  Skin: no rashes, lesions, ulcers. No induration Neurologic: CN 2-12 grossly intact. Sensation intact, DTR normal. Strength 5/5 in all 4.  Psychiatric: Normal judgment and insight. Alert and oriented x 3. Normal mood.   Labs on Admission: I have personally reviewed following labs and imaging studies  CBC:  Recent Labs Lab 04/23/16 1447  WBC 11.0*  HGB 12.3  HCT 36.5  MCV 96.3  PLT [id number]   Basic Metabolic Panel:  Recent Labs Lab 04/23/16 1447  NA 130*  K  5.2*  CL 80*  CO2 11*  GLUCOSE 579*  BUN 11  CREATININE 0.97  CALCIUM 12.6*   GFR: Estimated Creatinine Clearance: 57.5 mL/min (by C-G formula based on SCr of 0.97 mg/dL). Liver Function Tests: No results for input(s): AST, ALT, ALKPHOS, BILITOT, PROT, ALBUMIN in the last 168 hours. No results for input(s): LIPASE, AMYLASE in the last 168 hours. No results for input(s): AMMONIA in the last 168 hours. Coagulation Profile: No results for input(s): INR, PROTIME in the last 168 hours. Cardiac Enzymes: No results for input(s): CKTOTAL, CKMB, CKMBINDEX, TROPONINI in the last 168 hours. BNP (last 3 results) No results for input(s): PROBNP in the last 8760 hours. HbA1C: No results for input(s): HGBA1C in the last 72 hours. CBG:  Recent Labs Lab 04/23/16 1430  GLUCAP 556*   Lipid Profile: No results for input(s): CHOL, HDL, LDLCALC, TRIG, CHOLHDL, LDLDIRECT in the last 72 hours. Thyroid Function Tests: No results for input(s): TSH, T4TOTAL, FREET4, T3FREE, THYROIDAB in the last 72 hours. Anemia Panel: No results for input(s): VITAMINB12, FOLATE, FERRITIN, TIBC, IRON, RETICCTPCT in the last 72 hours. Urine analysis:    Component Value Date/Time   COLORURINE STRAW (A) 04/23/2016 1525   APPEARANCEUR CLEAR 04/23/2016 1525   LABSPEC 1.021 04/23/2016 1525   PHURINE 5.0 04/23/2016 1525   GLUCOSEU >=500 (A) 04/23/2016 1525   HGBUR NEGATIVE 04/23/2016 1525   BILIRUBINUR NEGATIVE 04/23/2016 1525   BILIRUBINUR neg 05/26/2015 1441   KETONESUR 80 (A) 04/23/2016 1525   PROTEINUR NEGATIVE 04/23/2016 1525   UROBILINOGEN 0.2 05/26/2015 1441   UROBILINOGEN 0.2 01/04/2015 1436   NITRITE NEGATIVE 04/23/2016 1525   LEUKOCYTESUR NEGATIVE 04/23/2016 1525    Radiological Exams on Admission: No results found.  EKG: Independently reviewed. None  Assessment/Plan DKA (diabetic ketoacidoses) ([place]): I agree with IV insulin and fluid resuscitation. I will give her an additional bolus of normal  saline. Continue DKA IV insulin per glucoomander protocol. Monitor electrolytes and replete as needed. There is also some infection, no source of ischemia, no new medications or changes in her medication, her pump is working [employer]. pregnancy test pending.  Diabetic gastroparesis associated with type 1 diabetes mellitus ([place]): Continue Zofran, she relates she would like to try a clear liquid diet.  Hyperkalemia: Due to her acidemia it should correct as the anion gap acidosis resolves.    Chronic diarrhea: She will need to follow-up as an outpatient with her gastroenterologist. . Acute hyponatremia: There is likely pseudo-due to elevated blood glucose her corrected sodium is within normal.   DVT prophylaxis: heparin Code Status: full Family Communication: none Disposition Plan: home in 2 days Consults called: none Admission status: inpatient   Charlynne Cousins MD [facility] Pager [phone]  If 7PM-7AM, please contact night-coverage [email or web address] Password [facility]  04/23/2016, 4:13 PM

## 2016-04-23 NOTE — ED Triage Notes (Signed)
Pt reports she began to feel unwell today with n/v/d and hyperglycemia. CBG 465 with EMS. Pt reports hx of dumping syndrome and feels like same. No recent changes in medication regimen. Pt has on insulin pump.

## 2016-04-23 NOTE — ED Provider Notes (Signed)
[facility] DEPT Provider Note   CSN: [id number] Arrival date & time: 04/23/16  1408     History   Chief Complaint Chief Complaint  Patient presents with  . Hyperglycemia    HPI [name] is a 36 y.o. female.  HPI Patient presents the emergency department with complaints of nausea vomiting and diarrhea and noted that her blood sugar was elevated.  No recent change in her medications.  She is on insulin pump.  No fevers or chills.  Denies abdominal pain.  Found to have elevated blood sugar 465 by EMS.  Patient is vomiting in the room on my arrival.  Symptoms are moderate to severe in severity   Past Medical History:  Diagnosis Date  . Anxiety   . Diabetes mellitus without complication ([place])    TYPE I  . Seizures ([place])    last seizure was over 10 years ago, not on medications    Patient Active Problem List   Diagnosis Date Noted  . Chronic pain syndrome 01/19/2016  . Elevated LFTs 01/19/2016  . Chronic diarrhea 10/15/2015  . Volume depletion 10/15/2015  . Sinus tachycardia 10/15/2015  . Restless leg syndrome 07/11/2015  . Diabetic neuropathy ([place]) 07/11/2015  . DKA (diabetic ketoacidoses) ([place]) 07/05/2015  . Anxiety state 05/26/2015  . Hyperglycemia due to type 1 diabetes mellitus ([place]) 05/03/2015  . Nausea vomiting and diarrhea 05/03/2015  . Insomnia 04/01/2015  . Malnutrition of moderate degree 03/12/2015  . DM type 1 (diabetes mellitus, type 1) ([place]) 03/11/2015  . Hyperglycemic hyperosmolar nonketotic coma ([place]) 03/11/2015  . Diarrhea due to malabsorption 03/11/2015  . Hyperglycemia 03/11/2015  . Hyperkalemia 01/11/2015  . Exocrine pancreatic insufficiency 07/30/2014  . Adjustment disorder with mixed anxiety and depressed mood 07/27/2014  . Personal history of noncompliance with medical treatment 06/22/2014  . Protein-calorie malnutrition, severe ([place]) 05/31/2014  . Diabetic gastroparesis associated with type 1 diabetes mellitus ([place]) 03/03/2014     Past Surgical History:  Procedure Laterality Date  . BACK SURGERY     age 20  . COLONOSCOPY WITH PROPOFOL N/A 12/02/2015   Procedure: COLONOSCOPY WITH PROPOFOL;  Surgeon: Lollie Sails, MD;  Location: [facility] ENDOSCOPY;  Service: Endoscopy;  Laterality: N/A;  . COLONOSCOPY WITH PROPOFOL N/A 01/02/2016   Procedure: COLONOSCOPY WITH PROPOFOL;  Surgeon: Lollie Sails, MD;  Location: [facility] ENDOSCOPY;  Service: Endoscopy;  Laterality: N/A;  . ESOPHAGOGASTRODUODENOSCOPY (EGD) WITH PROPOFOL N/A 06/27/2014   Procedure: ESOPHAGOGASTRODUODENOSCOPY (EGD) WITH PROPOFOL;  Surgeon: Milus Banister, MD;  Location: [facility] ENDOSCOPY;  Service: Endoscopy;  Laterality: N/A;  . FLEXIBLE SIGMOIDOSCOPY N/A 06/24/2014   Procedure: FLEXIBLE SIGMOIDOSCOPY;  Surgeon: Milus Banister, MD;  Location: [facility] ENDOSCOPY;  Service: Endoscopy;  Laterality: N/A;  . LAPAROSCOPY  02/2002, 06/2010   laparoscopy with lysis pelvic adhesions for pelvic pain  . ROOT CANAL  10/10/12  . TOOTH EXTRACTION      OB History    No data available       Home Medications    Prior to Admission medications   Medication Sig Start Date End Date Taking? Authorizing Provider  acetaminophen (TYLENOL) 500 MG tablet Take 500-1,000 mg by mouth every 6 (six) hours as needed for mild pain, moderate pain or headache.     Historical Provider, MD  ALPRAZolam Duanne Moron) 1 MG tablet Take 2 mg by mouth at bedtime.    Historical Provider, MD  amitriptyline (ELAVIL) 50 MG tablet Take 100 mg by mouth at bedtime.    Historical Provider, MD  amoxicillin (AMOXIL) 500 MG capsule Take 1 capsule (500 mg total) by mouth 3 (three) times daily. 02/06/16   Arnoldo Morale, MD  calcium carbonate (OSCAL) 1500 (600 Ca) MG TABS tablet Take 1,200 mg of elemental calcium by mouth daily with breakfast.    Historical Provider, MD  cholestyramine (QUESTRAN) 4 g packet Take 4 g by mouth 4 (four) times daily.    Historical Provider, MD  CREON 36000 UNITS CPEP capsule Take  144,000-216,000 Units by mouth 5 (five) times daily. Pt takes six capsules three times daily with meals and four capsules two times daily with snacks.    Historical Provider, MD  cyclobenzaprine (FLEXERIL) 10 MG tablet Take 10 mg by mouth 2 (two) times daily.     Historical Provider, MD  dicyclomine (BENTYL) 10 MG capsule Take 10 mg by mouth 3 (three) times daily as needed for spasms.     Historical Provider, MD  diphenoxylate-atropine (LOMOTIL) 2.5-0.025 MG per tablet Take 1 tablet by mouth 4 (four) times daily as needed for diarrhea or loose stools. 11/08/14   Orpah Greek, MD  feeding supplement, GLUCERNA SHAKE, (GLUCERNA SHAKE) LIQD Take 237 mLs by mouth 6 (six) times daily.    Historical Provider, MD  gabapentin (NEURONTIN) 300 MG capsule Take 300 mg by mouth 3 (three) times daily.    Historical Provider, MD  glucagon (GLUCAGON EMERGENCY) 1 MG injection Inject 1 mg into the vein once as needed (for severe hypoglycemia).    Historical Provider, MD  hydrOXYzine (ATARAX/VISTARIL) 25 MG tablet Take 25 mg by mouth 3 (three) times daily as needed for anxiety.    Historical Provider, MD  hyoscyamine (LEVSIN SL) 0.125 MG SL tablet Place 0.125 mg under the tongue every 4 (four) hours as needed for cramping or pain.    Historical Provider, MD  Insulin Human (INSULIN PUMP) SOLN Pt uses Humalog -- 10 units once daily and 0-5 units with meals.    Historical Provider, MD  Multiple Vitamin (MULTIVITAMIN WITH MINERALS) TABS Take 2 tablets by mouth daily.     Historical Provider, MD  omeprazole (PRILOSEC) 40 MG capsule Take 40 mg by mouth daily.     Historical Provider, MD  oxyCODONE-acetaminophen (PERCOCET) 7.5-325 MG tablet Take 1 tablet by mouth every 4 (four) hours as needed for severe pain.     Historical Provider, MD  Probiotic Product (DIGESTIVE ADVANTAGE GUMMIES PO) Take 2 each by mouth 2 (two) times daily.     Historical Provider, MD  promethazine (PHENERGAN) 25 MG suppository PLACE 1 SUPPOSITORY  RECTALLY EVERY 6 (SIX) HOURS AS NEEDED FOR NAUSEA OR VOMITING. 03/02/16   Arnoldo Morale, MD  promethazine (PHENERGAN) 25 MG tablet Take 25 mg by mouth every 6 (six) hours as needed for nausea or vomiting.    Historical Provider, MD  saccharomyces boulardii (FLORASTOR) 250 MG capsule Take 1 capsule (250 mg total) by mouth 2 (two) times daily. 07/06/14   Thurnell Lose, MD  traZODone (DESYREL) 50 MG tablet Take 50 mg by mouth at bedtime.    Historical Provider, MD  vitamin B-12 (CYANOCOBALAMIN) 1000 MCG tablet Take 1,000 mcg by mouth 2 (two) times daily.    Historical Provider, MD    Family History Family History  Problem Relation Age of Onset  . Diabetes Maternal Grandmother   . Cancer Maternal Grandmother     Leukemia  . Cancer Maternal Grandfather     Small cell lung cancer    Social History Social History  Substance Use Topics  .  Smoking status: Former Smoker    Quit date: 04/19/2000  . Smokeless tobacco: Former Systems developer  . Alcohol use No     Comment: RARE SIPS OF WINE     Allergies   Morphine; [place] [shellfish allergy]; Betamethasone; Diphenhydramine hcl; Haloperidol and related; Ketorolac; Latex; and Other   Review of Systems Review of Systems  All other systems reviewed and are negative.    Physical Exam Updated Vital Signs BP 106/78   Pulse (!) 135   Temp 98.3 F (36.8 C) (Oral)   Resp 16   Wt 100 lb (45.4 kg)   SpO2 100%   BMI 16.64 kg/m   Physical Exam  Constitutional: She is oriented to person, place, and time. She appears well-developed and well-nourished.  Ill-appearing  HENT:  Head: Normocephalic and atraumatic.  Eyes: EOM are normal.  Neck: Normal range of motion.  Cardiovascular: Regular rhythm and normal heart sounds.   Tachycardia.  Pulmonary/Chest: Effort normal and breath sounds normal.  Mild tachypnea  Abdominal: Soft. She exhibits no distension. There is no tenderness.  Musculoskeletal: Normal range of motion.  Neurological: She is alert  and oriented to person, place, and time.  Skin: Skin is warm and dry.  Psychiatric: She has a normal mood and affect. Judgment normal.  Nursing note and vitals reviewed.    ED Treatments / Results  Labs (all labs ordered are listed, but only abnormal results are displayed) Labs Reviewed  BASIC METABOLIC PANEL - Abnormal; Notable for the following:       Result Value   Sodium 130 (*)    Potassium 5.2 (*)    Chloride 80 (*)    CO2 11 (*)    Glucose, Bld 579 (*)    Calcium 12.6 (*)    Anion gap 39 (*)    All other components within normal limits  CBC - Abnormal; Notable for the following:    WBC 11.0 (*)    RBC 3.79 (*)    RDW 17.2 (*)    All other components within normal limits  CBG MONITORING, ED - Abnormal; Notable for the following:    Glucose-Capillary 556 (*)    All other components within normal limits  URINALYSIS, ROUTINE W REFLEX MICROSCOPIC  BLOOD GAS, VENOUS  I-STAT BETA HCG BLOOD, ED ([facility], [facility], [facility] ONLY)    EKG  EKG Interpretation None       Radiology No results found.  Procedures Procedures (including critical care time)    +++++++++++++++++++++++++++++++++++++++++++++++++  CRITICAL CARE Performed by: Hoy Morn Total critical care time: 35 minutes Critical care time was exclusive of separately billable procedures and treating other patients. Critical care was necessary to treat or prevent imminent or life-threatening deterioration. Critical care was time spent personally by me on the following activities: development of treatment plan with patient and/or surrogate as well as nursing, discussions with consultants, evaluation of patient's response to treatment, examination of patient, obtaining history from patient or surrogate, ordering and performing treatments and interventions, ordering and review of laboratory studies, ordering and review of radiographic studies, pulse oximetry and re-evaluation of patient's  condition.  ++++++++++++++++++++++++++++++++++++++++++++++     Medications Ordered in ED Medications  sodium chloride 0.9 % bolus 2,000 mL (not administered)  ondansetron (ZOFRAN) injection 4 mg (not administered)  dextrose 5 %-0.45 % sodium chloride infusion (not administered)  insulin regular (NOVOLIN R,HUMULIN R) 250 Units in sodium chloride 0.9 % 250 mL (1 Units/mL) infusion (not administered)  HYDROmorphone (DILAUDID) injection 1 mg (not administered)  metoCLOPramide (REGLAN) injection 10 mg (not administered)     Initial Impression / Assessment and Plan / ED Course  I have reviewed the triage vital signs and the nursing notes.  Pertinent labs & imaging results that were available during my care of the patient were reviewed by me and considered in my medical decision making (see chart for details).     Patient with DKA on arrival emergency department.  She is vomiting.  2 L IV fluids now.  Patient be started on insulin drip.  She will stop her insulin pump once her insulin is initiated in the ER.  Anion gap 39.  Bicarbonate 11.  Final Clinical Impressions(s) / ED Diagnoses   Final diagnoses:  Diabetic ketoacidosis without coma associated with type 1 diabetes mellitus ([place])    New Prescriptions New Prescriptions   No medications on file     Jola Schmidt, MD 04/23/16 1553

## 2016-04-24 DIAGNOSIS — K529 Noninfective gastroenteritis and colitis, unspecified: Secondary | ICD-10-CM | POA: Diagnosis not present

## 2016-04-24 DIAGNOSIS — E1010 Type 1 diabetes mellitus with ketoacidosis without coma: Secondary | ICD-10-CM | POA: Diagnosis not present

## 2016-04-24 DIAGNOSIS — E871 Hypo-osmolality and hyponatremia: Secondary | ICD-10-CM | POA: Diagnosis not present

## 2016-04-24 DIAGNOSIS — E1043 Type 1 diabetes mellitus with diabetic autonomic (poly)neuropathy: Secondary | ICD-10-CM | POA: Diagnosis not present

## 2016-04-24 LAB — BASIC METABOLIC PANEL
ANION GAP: 8 (ref 5–15)
ANION GAP: 13 (ref 5–15)
ANION GAP: 17 — AB (ref 5–15)
ANION GAP: 13 (ref 5–15)
BUN: 10 mg/dL (ref 6–20)
BUN: 11 mg/dL (ref 6–20)
BUN: 12 mg/dL (ref 6–20)
BUN: 11 mg/dL (ref 6–20)
CALCIUM: 9.4 mg/dL (ref 8.9–10.3)
CALCIUM: 8.9 mg/dL (ref 8.9–10.3)
CALCIUM: 8.5 mg/dL — AB (ref 8.9–10.3)
CHLORIDE: 94 mmol/L — AB (ref 101–111)
CHLORIDE: 93 mmol/L — AB (ref 101–111)
CHLORIDE: 94 mmol/L — AB (ref 101–111)
CO2: 25 mmol/L (ref 22–32)
CO2: 22 mmol/L (ref 22–32)
CO2: 17 mmol/L — AB (ref 22–32)
CO2: 21 mmol/L — AB (ref 22–32)
Calcium: 8.8 mg/dL — ABNORMAL LOW (ref 8.9–10.3)
Chloride: 97 mmol/L — ABNORMAL LOW (ref 101–111)
Creatinine, Ser: 0.78 mg/dL (ref 0.44–1.00)
Creatinine, Ser: 0.57 mg/dL (ref 0.44–1.00)
Creatinine, Ser: 0.82 mg/dL (ref 0.44–1.00)
Creatinine, Ser: 0.89 mg/dL (ref 0.44–1.00)
GFR calc Af Amer: >60 mL/min (ref >60)
GFR calc Af Amer: >60 mL/min (ref >60)
GFR calc Af Amer: >60 mL/min (ref >60)
GFR calc Af Amer: >60 mL/min (ref >60)
GFR calc non Af Amer: >60 mL/min (ref >60)
GFR calc non Af Amer: >60 mL/min (ref >60)
GFR calc non Af Amer: >60 mL/min (ref >60)
GFR calc non Af Amer: >60 mL/min (ref >60)
GLUCOSE: 112 mg/dL — AB (ref 65–99)
GLUCOSE: 176 mg/dL — AB (ref 65–99)
GLUCOSE: 379 mg/dL — AB (ref 65–99)
GLUCOSE: 215 mg/dL — AB (ref 65–99)
POTASSIUM: 3.6 mmol/L (ref 3.5–5.1)
Potassium: 4.3 mmol/L (ref 3.5–5.1)
Potassium: 4.4 mmol/L (ref 3.5–5.1)
Potassium: 5 mmol/L (ref 3.5–5.1)
Sodium: 130 mmol/L — ABNORMAL LOW (ref 135–145)
Sodium: 129 mmol/L — ABNORMAL LOW (ref 135–145)
Sodium: 127 mmol/L — ABNORMAL LOW (ref 135–145)
Sodium: 128 mmol/L — ABNORMAL LOW (ref 135–145)

## 2016-04-24 LAB — CBG MONITORING, ED
GLUCOSE-CAPILLARY: 129 mg/dL — AB (ref 65–99)
GLUCOSE-CAPILLARY: 300 mg/dL — AB (ref 65–99)
GLUCOSE-CAPILLARY: 403 mg/dL — AB (ref 65–99)
GLUCOSE-CAPILLARY: 377 mg/dL — AB (ref 65–99)
GLUCOSE-CAPILLARY: 232 mg/dL — AB (ref 65–99)
GLUCOSE-CAPILLARY: 139 mg/dL — AB (ref 65–99)
Glucose-Capillary: 102 mg/dL — ABNORMAL HIGH (ref 65–99)
Glucose-Capillary: 116 mg/dL — ABNORMAL HIGH (ref 65–99)
Glucose-Capillary: 140 mg/dL — ABNORMAL HIGH (ref 65–99)
Glucose-Capillary: 356 mg/dL — ABNORMAL HIGH (ref 65–99)
Glucose-Capillary: 416 mg/dL — ABNORMAL HIGH (ref 65–99)
Glucose-Capillary: 69 mg/dL (ref 65–99)

## 2016-04-24 LAB — CBC
HEMATOCRIT: 27.1 % — AB (ref 36.0–46.0)
HEMATOCRIT: 28.7 % — AB (ref 36.0–46.0)
HEMOGLOBIN: 9.4 g/dL — AB (ref 12.0–15.0)
HEMOGLOBIN: 10.2 g/dL — AB (ref 12.0–15.0)
MCH: 32.9 pg (ref 26.0–34.0)
MCH: 33 pg (ref 26.0–34.0)
MCHC: 34.7 g/dL (ref 30.0–36.0)
MCHC: 35.5 g/dL (ref 30.0–36.0)
MCV: 94.8 fL (ref 78.0–100.0)
MCV: 92.9 fL (ref 78.0–100.0)
Platelets: 244 10*3/uL (ref 150–400)
Platelets: 168 10*3/uL (ref 150–400)
RBC: 2.86 MIL/uL — AB (ref 3.87–5.11)
RBC: 3.09 MIL/uL — ABNORMAL LOW (ref 3.87–5.11)
RDW: 17 % — ABNORMAL HIGH (ref 11.5–15.5)
RDW: 16.9 % — AB (ref 11.5–15.5)
WBC: 14.8 10*3/uL — ABNORMAL HIGH (ref 4.0–10.5)
WBC: 13.5 10*3/uL — ABNORMAL HIGH (ref 4.0–10.5)

## 2016-04-24 LAB — HIV ANTIBODY (ROUTINE TESTING W REFLEX): HIV Screen 4th Generation wRfx: NONREACTIVE

## 2016-04-24 MED ORDER — CALCIUM CARBONATE 1250 (500 CA) MG PO TABS
1000.0000 mg | ORAL_TABLET | Freq: Every day | ORAL | Status: DC
  Filled 2016-04-24: qty 2

## 2016-04-24 MED ORDER — SODIUM CHLORIDE 0.9 % IV SOLN
INTRAVENOUS | Status: DC
  Administered 2016-04-24: 07:00:00 via INTRAVENOUS

## 2016-04-24 MED ORDER — SODIUM CHLORIDE 0.9 % IV SOLN
INTRAVENOUS | Status: DC
  Administered 2016-04-24: 08:00:00 via INTRAVENOUS

## 2016-04-24 MED ORDER — GABAPENTIN 300 MG PO CAPS: 300.0000 mg | ORAL_CAPSULE | Freq: Three times a day (TID) | ORAL | Status: DC

## 2016-04-24 MED ORDER — DEXTROSE-NACL 5-0.45 % IV SOLN: INTRAVENOUS | Status: DC

## 2016-04-24 MED ORDER — SODIUM CHLORIDE 0.9 % IV SOLN: INTRAVENOUS | Status: DC

## 2016-04-24 MED ORDER — INSULIN PUMP
Freq: Three times a day (TID) | SUBCUTANEOUS | Status: DC
  Filled 2016-04-24: qty 1

## 2016-04-24 MED ORDER — PANCRELIPASE (LIP-PROT-AMYL) 36000-114000 UNITS PO CPEP
144000.0000 [IU] | ORAL_CAPSULE | ORAL | Status: DC | PRN
  Filled 2016-04-24: qty 4

## 2016-04-24 MED ORDER — AMITRIPTYLINE HCL 25 MG PO TABS: 100.0000 mg | ORAL_TABLET | Freq: Every evening | ORAL | Status: DC | PRN

## 2016-04-24 MED ORDER — ACETAMINOPHEN 325 MG PO TABS: 650.0000 mg | ORAL_TABLET | Freq: Four times a day (QID) | ORAL | Status: DC | PRN

## 2016-04-24 MED ORDER — HYOSCYAMINE SULFATE 0.125 MG SL SUBL: 0.1250 mg | SUBLINGUAL_TABLET | SUBLINGUAL | Status: DC | PRN

## 2016-04-24 MED ORDER — SACCHAROMYCES BOULARDII 250 MG PO CAPS
250.0000 mg | ORAL_CAPSULE | Freq: Two times a day (BID) | ORAL | Status: DC
  Filled 2016-04-24: qty 1

## 2016-04-24 MED ORDER — DEXTROSE 50 % IV SOLN
25.0000 mL | INTRAVENOUS | Status: DC | PRN
Start: 2016-04-24 — End: 2016-04-24

## 2016-04-24 MED ORDER — HEPARIN SODIUM (PORCINE) 5000 UNIT/ML IJ SOLN
5000.0000 [IU] | Freq: Three times a day (TID) | INTRAMUSCULAR | Status: DC
  Administered 2016-04-24: 5000 [IU] via SUBCUTANEOUS
  Filled 2016-04-24 (×3): qty 1

## 2016-04-24 MED ORDER — HYDROMORPHONE HCL 1 MG/ML IJ SOLN
1.0000 mg | Freq: Once | INTRAMUSCULAR | Status: AC
  Administered 2016-04-24: 1 mg via INTRAVENOUS
  Filled 2016-04-24: qty 1

## 2016-04-24 MED ORDER — INSULIN DETEMIR 100 UNIT/ML ~~LOC~~ SOLN
5.0000 [IU] | Freq: Two times a day (BID) | SUBCUTANEOUS | Status: DC
  Administered 2016-04-24: 5 [IU] via SUBCUTANEOUS
  Filled 2016-04-24 (×2): qty 0.05

## 2016-04-24 MED ORDER — TRAZODONE HCL 50 MG PO TABS: 50.0000 mg | ORAL_TABLET | Freq: Every evening | ORAL | Status: DC | PRN

## 2016-04-24 MED ORDER — SODIUM CHLORIDE 0.9 % IV SOLN
INTRAVENOUS | Status: DC
  Filled 2016-04-24: qty 2.5

## 2016-04-24 MED ORDER — DEXTROSE 5 % IV SOLN
INTRAVENOUS | Status: DC
  Administered 2016-04-24: 09:00:00 via INTRAVENOUS

## 2016-04-24 MED ORDER — INSULIN DETEMIR 100 UNIT/ML ~~LOC~~ SOLN
5.0000 [IU] | Freq: Once | SUBCUTANEOUS | Status: AC
  Administered 2016-04-24: 5 [IU] via SUBCUTANEOUS
  Filled 2016-04-24: qty 0.05

## 2016-04-24 MED ORDER — INSULIN REGULAR BOLUS VIA INFUSION
0.0000 [IU] | Freq: Three times a day (TID) | INTRAVENOUS | Status: DC
  Filled 2016-04-24: qty 10

## 2016-04-24 MED ORDER — DICYCLOMINE HCL 10 MG PO CAPS: 10.0000 mg | ORAL_CAPSULE | Freq: Three times a day (TID) | ORAL | Status: DC | PRN

## 2016-04-24 MED ORDER — INSULIN ASPART 100 UNIT/ML ~~LOC~~ SOLN: 0.0000 [IU] | Freq: Every day | SUBCUTANEOUS | Status: DC

## 2016-04-24 MED ORDER — DEXTROSE 50 % IV SOLN
12.0000 mL | Freq: Once | INTRAVENOUS | Status: AC
  Administered 2016-04-24: 12 mL via INTRAVENOUS
  Filled 2016-04-24: qty 50

## 2016-04-24 MED ORDER — INSULIN ASPART 100 UNIT/ML ~~LOC~~ SOLN: 3.0000 [IU] | Freq: Three times a day (TID) | SUBCUTANEOUS | Status: DC

## 2016-04-24 MED ORDER — SODIUM CHLORIDE 0.9% FLUSH: 3.0000 mL | Freq: Two times a day (BID) | INTRAVENOUS | Status: DC

## 2016-04-24 MED ORDER — ACETAMINOPHEN 650 MG RE SUPP: 650.0000 mg | Freq: Four times a day (QID) | RECTAL | Status: DC | PRN

## 2016-04-24 MED ORDER — PANCRELIPASE (LIP-PROT-AMYL) 36000-114000 UNITS PO CPEP
216000.0000 [IU] | ORAL_CAPSULE | Freq: Three times a day (TID) | ORAL | Status: DC
  Filled 2016-04-24 (×3): qty 6

## 2016-04-24 MED ORDER — PANTOPRAZOLE SODIUM 40 MG PO TBEC: 40.0000 mg | DELAYED_RELEASE_TABLET | Freq: Every day | ORAL | Status: DC

## 2016-04-24 MED ORDER — ALPRAZOLAM 0.5 MG PO TABS: 2.0000 mg | ORAL_TABLET | Freq: Every evening | ORAL | Status: DC | PRN

## 2016-04-24 MED ORDER — SODIUM CHLORIDE 0.9 % IV SOLN: INTRAVENOUS | Status: AC

## 2016-04-24 MED ORDER — SODIUM CHLORIDE 0.9 % IV SOLN
INTRAVENOUS | Status: DC
  Filled 2016-04-24 (×2): qty 2.5

## 2016-04-24 MED ORDER — CHOLESTYRAMINE 4 G PO PACK
4.0000 g | PACK | Freq: Four times a day (QID) | ORAL | Status: DC
  Filled 2016-04-24 (×3): qty 1

## 2016-04-24 MED ORDER — INSULIN ASPART 100 UNIT/ML ~~LOC~~ SOLN: 0.0000 [IU] | Freq: Three times a day (TID) | SUBCUTANEOUS | Status: DC

## 2016-04-24 NOTE — Discharge Summary (Signed)
Physician Discharge Summary  [name] [id number] DOB: Mar 18, 1980 DOA: 04/23/2016  PCP: Arnoldo Morale, MD  Admit date: 04/23/2016 Discharge date: 04/24/2016  Admitted From: home Disposition:  Home  Recommendations for Outpatient Follow-up:  1. Follow up with PCP in 1-2 weeks   Home Health:no Equipment/Devices:none  Discharge Condition:stable CODE STATUS:full Diet recommendation: Heart Healthy   Brief/Interim Summary: 36 y.o. female with medical history significant of type 1 diabetes comes into the hospital complaining of nausea, vomiting, abdominal pain and elevated blood glucose. She denies any fever or chills. She called EMS her sugar was found in the 400s. She denies any cough shortness of breath or sores no recent change in her medications no new medications. No recent dental procedures no mouth pain.  Discharge Diagnoses:  Active Problems:   Diabetic gastroparesis associated with type 1 diabetes mellitus (HCC)   Hyperkalemia   DKA (diabetic ketoacidoses) (HCC)   Chronic diarrhea   Acute hyponatremia  DKA (diabetic ketoacidoses) ([place]): She was started on IV insulin protocol her anion gap closed with bicarbonate was greater than 20. This was DC'd and she was transitioned to long-acting insulin. Monitor electrolytes and replete as needed. There no signs infection, no source of ischemia, no new medications or changes in her medication, it must be that her pump is working adequately. Negative  Diabetic gastroparesis associated with type 1 diabetes mellitus ([place]): Continue Zofran, she relates she would like to try a clear liquid diet.  HyperkaIs improved with correction of her acidemia. hronic diarrhea: She will need to follow-up as an outpatient with her gastroenterologist. . Acute hyponatremia: There is likely pseudo-due to elevated blood glucose, this corrected once to hypoglycemia improved.   Discharge Instructions  Discharge Instructions    Diet - low  sodium heart healthy    Complete by:  As directed    Increase activity slowly    Complete by:  As directed      Allergies as of 04/24/2016      Reactions   Morphine Anaphylaxis, Itching, Other (See Comments)   Pt states that her lips turn blue.     Scallops [shellfish Allergy] Anaphylaxis   Betamethasone Swelling, Other (See Comments)   Reaction:  Facial swelling   Diphenhydramine Hcl Nausea Only, Other (See Comments)   Pt states that she is only allergic to the IV form.     Haloperidol And Related Itching   Ketorolac Itching   Latex Rash   Other Rash, Other (See Comments)   Pt states that she is allergic to sunscreen and grass.       Medication List    TAKE these medications   acetaminophen 500 MG tablet Commonly known as:  TYLENOL Take 500 mg by mouth every 6 (six) hours as needed for mild pain, moderate pain or headache.   ALPRAZolam 1 MG tablet Commonly known as:  XANAX Take 2 mg by mouth at bedtime as needed for sleep.   amitriptyline 50 MG tablet Commonly known as:  ELAVIL Take 100 mg by mouth at bedtime as needed for sleep.   calcium carbonate 1500 (600 Ca) MG Tabs tablet Commonly known as:  OSCAL Take 1,200 mg of elemental calcium by mouth daily with breakfast.   cholestyramine 4 g packet Commonly known as:  QUESTRAN Take 4 g by mouth 4 (four) times daily.   CREON 36000 UNITS Cpep capsule Generic drug:  lipase/protease/amylase Take 144,000-216,000 Units by mouth 5 (five) times daily. Pt takes six capsules three times daily with meals and  four capsules two times daily with snacks.   cyclobenzaprine 10 MG tablet Commonly known as:  FLEXERIL Take 10 mg by mouth 2 (two) times daily.   dicyclomine 10 MG capsule Commonly known as:  BENTYL Take 10 mg by mouth 3 (three) times daily as needed for spasms.   DIGESTIVE ADVANTAGE GUMMIES PO Take 2 each by mouth 2 (two) times daily.   feeding supplement (GLUCERNA SHAKE) Liqd Take 237 mLs by mouth 6 (six) times  daily.   gabapentin 300 MG capsule Commonly known as:  NEURONTIN Take 300 mg by mouth 3 (three) times daily.   GLUCAGON EMERGENCY 1 MG injection Generic drug:  glucagon Inject 1 mg into the vein once as needed (for severe hypoglycemia).   hydrOXYzine 25 MG tablet Commonly known as:  ATARAX/VISTARIL Take 25 mg by mouth 3 (three) times daily as needed for anxiety.   hyoscyamine 0.125 MG SL tablet Commonly known as:  LEVSIN SL Place 0.125 mg under the tongue every 4 (four) hours as needed for cramping or pain.   insulin pump Soln Pt uses Humalog -- 10 units once daily and 0-5 units with meals.   multivitamin with minerals Tabs tablet Take 2 tablets by mouth daily.   omeprazole 40 MG capsule Commonly known as:  PRILOSEC Take 40 mg by mouth daily.   oxyCODONE-acetaminophen 7.5-325 MG tablet Commonly known as:  PERCOCET Take 1 tablet by mouth every 4 (four) hours as needed for severe pain.   promethazine 25 MG tablet Commonly known as:  PHENERGAN Take 25 mg by mouth every 6 (six) hours as needed for nausea or vomiting.   promethazine 25 MG suppository Commonly known as:  PHENERGAN PLACE 1 SUPPOSITORY RECTALLY EVERY 6 (SIX) HOURS AS NEEDED FOR NAUSEA OR VOMITING.   saccharomyces boulardii 250 MG capsule Commonly known as:  FLORASTOR Take 1 capsule (250 mg total) by mouth 2 (two) times daily.   traZODone 50 MG tablet Commonly known as:  DESYREL Take 50 mg by mouth at bedtime as needed for sleep.   vitamin B-12 1000 MCG tablet Commonly known as:  CYANOCOBALAMIN Take 1,000 mcg by mouth 2 (two) times daily.      Follow-up Information    Arnoldo Morale, MD.   Specialty:  Family Medicine Contact information: [address] [place] [address] [phone]          Allergies  Allergen Reactions  . Morphine Anaphylaxis, Itching and Other (See Comments)    Pt states that her lips turn blue.    Elyse Hsu [Shellfish Allergy] Anaphylaxis  . Betamethasone  Swelling and Other (See Comments)    Reaction:  Facial swelling  . Diphenhydramine Hcl Nausea Only and Other (See Comments)    Pt states that she is only allergic to the IV form.    . Haloperidol And Related Itching  . Ketorolac Itching  . Latex Rash  . Other Rash and Other (See Comments)    Pt states that she is allergic to sunscreen and grass.     Consultations:  None   Procedures/Studies:  No results found. (Echo, Carotid, EGD, Colonoscopy, ERCP)    Subjective: No complains  Discharge Exam: Vitals:   04/24/16 1139 04/24/16 1200  BP: 97/70 95/63  Pulse: 118 105  Resp:  15  Temp:     Vitals:   04/24/16 1106 04/24/16 1139 04/24/16 1145 04/24/16 1200  BP: (!) 82/58 97/70  95/63  Pulse: 111 118  105  Resp: 15   15  Temp:      TempSrc:      SpO2: 100% 100%  100%  Weight:   49.9 kg (110 lb)   Height:   5\' 5"  (1.651 m)     General: Pt is alert, awake, not in acute distress Cardiovascular: RRR, S1/S2 +, no rubs, no gallops Respiratory: CTA bilaterally, no wheezing, no rhonchi Abdominal: Soft, NT, ND, bowel sounds + Extremities: no edema, no cyanosis    The results of significant diagnostics from this hospitalization (including imaging, microbiology, ancillary and laboratory) are listed below for reference.     Microbiology: No results found for this or any previous visit (from the past 240 hour(s)).   Labs: BNP (last 3 results) No results for input(s): BNP in the last 8760 hours. Basic Metabolic Panel:  Recent Labs Lab 04/23/16 1842 04/24/16 0129 04/24/16 0628 04/24/16 0900 04/24/16 1314  NA 130* 130* 129* 127* 128*  K 4.0 4.3 4.4 5.0 3.6  CL 95* 97* 94* 93* 94*  CO2 15* 25 22 17* 21*  GLUCOSE 401* 112* 176* 379* 215*  BUN 10 10 11 12 11   CREATININE 0.92 0.78 0.57 0.82 0.89  CALCIUM 8.9 9.4 8.9 8.5* 8.8*   Liver Function Tests: No results for input(s): AST, ALT, ALKPHOS, BILITOT, PROT, ALBUMIN in the last 168 hours. No results for input(s):  LIPASE, AMYLASE in the last 168 hours. No results for input(s): AMMONIA in the last 168 hours. CBC:  Recent Labs Lab 04/23/16 1447 04/24/16 0129 04/24/16 0628  WBC 11.0* 14.8* 13.5*  HGB 12.3 9.4* 10.2*  HCT 36.5 27.1* 28.7*  MCV 96.3 94.8 92.9  PLT 359 244 168   Cardiac Enzymes: No results for input(s): CKTOTAL, CKMB, CKMBINDEX, TROPONINI in the last 168 hours. BNP: Invalid input(s): POCBNP CBG:  Recent Labs Lab 04/24/16 1034 04/24/16 1105 04/24/16 1127 04/24/16 1252 04/24/16 1406  GLUCAP 416* 377* 356* 232* 139*   D-Dimer No results for input(s): DDIMER in the last 72 hours. Hgb A1c No results for input(s): HGBA1C in the last 72 hours. Lipid Profile No results for input(s): CHOL, HDL, LDLCALC, TRIG, CHOLHDL, LDLDIRECT in the last 72 hours. Thyroid function studies No results for input(s): TSH, T4TOTAL, T3FREE, THYROIDAB in the last 72 hours.  Invalid input(s): FREET3 Anemia work up No results for input(s): VITAMINB12, FOLATE, FERRITIN, TIBC, IRON, RETICCTPCT in the last 72 hours. Urinalysis    Component Value Date/Time   COLORURINE STRAW (A) 04/23/2016 1525   APPEARANCEUR CLEAR 04/23/2016 1525   LABSPEC 1.021 04/23/2016 1525   PHURINE 5.0 04/23/2016 1525   GLUCOSEU >=500 (A) 04/23/2016 1525   HGBUR NEGATIVE 04/23/2016 1525   BILIRUBINUR NEGATIVE 04/23/2016 1525   BILIRUBINUR neg 05/26/2015 1441   KETONESUR 80 (A) 04/23/2016 1525   PROTEINUR NEGATIVE 04/23/2016 1525   UROBILINOGEN 0.2 05/26/2015 1441   UROBILINOGEN 0.2 01/04/2015 1436   NITRITE NEGATIVE 04/23/2016 1525   LEUKOCYTESUR NEGATIVE 04/23/2016 1525   Sepsis Labs Invalid input(s): PROCALCITONIN,  WBC,  LACTICIDVEN Microbiology No results found for this or any previous visit (from the past 240 hour(s)).   Time coordinating discharge: Over 30 minutes  SIGNED:   Charlynne Cousins, MD  [facility] 04/24/2016, 2:16 PM Pager   If 7PM-7AM, please contact  night-coverage [email or web address] Password [email or web address]

## 2016-04-24 NOTE — ED Notes (Signed)
PT DISCHARGED. INSTRUCTIONS GIVEN. AAOX4. PT IN NO APPARENT DISTRESS. THE OPPORTUNITY TO ASK QUESTIONS WAS PROVIDED. 

## 2016-05-10 ENCOUNTER — Ambulatory Visit: Payer: Medicaid Other | Attending: Family Medicine | Admitting: Family Medicine

## 2016-05-10 ENCOUNTER — Encounter: Payer: Self-pay | Admitting: Family Medicine

## 2016-05-10 VITALS — BP 110/80 | HR 107 | Temp 98.9°F | Ht 65.0 in | Wt 93.8 lb

## 2016-05-10 DIAGNOSIS — E1065 Type 1 diabetes mellitus with hyperglycemia: Secondary | ICD-10-CM | POA: Insufficient documentation

## 2016-05-10 DIAGNOSIS — K3184 Gastroparesis: Secondary | ICD-10-CM | POA: Insufficient documentation

## 2016-05-10 DIAGNOSIS — E1011 Type 1 diabetes mellitus with ketoacidosis with coma: Secondary | ICD-10-CM | POA: Diagnosis not present

## 2016-05-10 DIAGNOSIS — G4700 Insomnia, unspecified: Secondary | ICD-10-CM | POA: Diagnosis not present

## 2016-05-10 DIAGNOSIS — G4709 Other insomnia: Secondary | ICD-10-CM

## 2016-05-10 DIAGNOSIS — E43 Unspecified severe protein-calorie malnutrition: Secondary | ICD-10-CM | POA: Insufficient documentation

## 2016-05-10 DIAGNOSIS — Z794 Long term (current) use of insulin: Secondary | ICD-10-CM | POA: Insufficient documentation

## 2016-05-10 DIAGNOSIS — R112 Nausea with vomiting, unspecified: Secondary | ICD-10-CM | POA: Diagnosis not present

## 2016-05-10 DIAGNOSIS — R197 Diarrhea, unspecified: Secondary | ICD-10-CM | POA: Insufficient documentation

## 2016-05-10 DIAGNOSIS — E1043 Type 1 diabetes mellitus with diabetic autonomic (poly)neuropathy: Secondary | ICD-10-CM | POA: Diagnosis not present

## 2016-05-10 DIAGNOSIS — Z9641 Presence of insulin pump (external) (internal): Secondary | ICD-10-CM | POA: Insufficient documentation

## 2016-05-10 DIAGNOSIS — L0100 Impetigo, unspecified: Secondary | ICD-10-CM | POA: Insufficient documentation

## 2016-05-10 DIAGNOSIS — F411 Generalized anxiety disorder: Secondary | ICD-10-CM | POA: Diagnosis not present

## 2016-05-10 DIAGNOSIS — R21 Rash and other nonspecific skin eruption: Secondary | ICD-10-CM | POA: Insufficient documentation

## 2016-05-10 LAB — POCT GLYCOSYLATED HEMOGLOBIN (HGB A1C): Hemoglobin A1C: 14.5

## 2016-05-10 LAB — GLUCOSE, POCT (MANUAL RESULT ENTRY): POC Glucose: >600 mg/dl (ref 70–99)

## 2016-05-10 MED ORDER — HYDROXYZINE HCL 25 MG PO TABS: 25.0000 mg | ORAL_TABLET | Freq: Three times a day (TID) | ORAL | 3 refills | Status: AC | PRN

## 2016-05-10 MED ORDER — PROMETHAZINE HCL 25 MG RE SUPP: RECTAL | 2 refills | Status: AC

## 2016-05-10 MED ORDER — OFLOXACIN 0.3 % OP SOLN: 1.0000 [drp] | Freq: Four times a day (QID) | OPHTHALMIC | 0 refills | Status: AC

## 2016-05-10 MED ORDER — TRAZODONE HCL 50 MG PO TABS: 50.0000 mg | ORAL_TABLET | Freq: Every evening | ORAL | 3 refills | Status: DC | PRN

## 2016-05-10 MED ORDER — MAGIC MOUTHWASH W/LIDOCAINE: 5.0000 mL | Freq: Four times a day (QID) | ORAL | 0 refills | Status: AC | PRN

## 2016-05-10 MED ORDER — MUPIROCIN 2 % EX OINT: 1.0000 "application " | TOPICAL_OINTMENT | Freq: Two times a day (BID) | CUTANEOUS | 1 refills | Status: AC

## 2016-05-10 MED ORDER — PROMETHAZINE HCL 25 MG PO TABS: 25.0000 mg | ORAL_TABLET | Freq: Three times a day (TID) | ORAL | 2 refills | Status: AC | PRN

## 2016-05-10 NOTE — Progress Notes (Signed)
Subjective:  Patient ID: [name], female    DOB: 10-03-1980  Age: 36 y.o. MRN: [id number]  CC: Rash (face, genitalia) and Diabetes   HPI [name] presents is a 36 year old female with a history of type 1 diabetes mellitus (A1c 14.5), gastroparesis secondary to diabetes, chronic diarrhea , pancreatic insufficiency who comes in for a follow-up visit.  She complains of a rash on her face which is around her nostrils and upper lip and was initially on her eyes and her genitalia for which she was seen at an urgent care and received Keflex which she is currently taking. Rash has improved since then but still persist around her nose; she also had purulent discharge from her eyes with her eyes being matted whenever she woke up. Denies fever  Her blood sugar is greater than 600 and she currently has an insulin pump; states she gave herself 2 units of NovoLog prior to coming here. Refuses insulin from the clinic; endocrinology appointment comes up next month at [facility]. Her pancreatic insufficiency and chronic diarrhea is managed by GI with [facility].  Past Medical History:  Diagnosis Date  . Anxiety   . Diabetes mellitus without complication ([place])    TYPE I    Past Surgical History:  Procedure Laterality Date  . BACK SURGERY     age 53  . COLONOSCOPY WITH PROPOFOL N/A 12/02/2015   Procedure: COLONOSCOPY WITH PROPOFOL;  Surgeon: Lollie Sails, MD;  Location: [facility] ENDOSCOPY;  Service: Endoscopy;  Laterality: N/A;  . COLONOSCOPY WITH PROPOFOL N/A 01/02/2016   Procedure: COLONOSCOPY WITH PROPOFOL;  Surgeon: Lollie Sails, MD;  Location: [facility] ENDOSCOPY;  Service: Endoscopy;  Laterality: N/A;  . ESOPHAGOGASTRODUODENOSCOPY (EGD) WITH PROPOFOL N/A 06/27/2014   Procedure: ESOPHAGOGASTRODUODENOSCOPY (EGD) WITH PROPOFOL;  Surgeon: Milus Banister, MD;  Location: [facility] ENDOSCOPY;  Service: Endoscopy;  Laterality: N/A;  . FLEXIBLE SIGMOIDOSCOPY N/A 06/24/2014   Procedure:  FLEXIBLE SIGMOIDOSCOPY;  Surgeon: Milus Banister, MD;  Location: [facility] ENDOSCOPY;  Service: Endoscopy;  Laterality: N/A;  . LAPAROSCOPY  02/2002, 06/2010   laparoscopy with lysis pelvic adhesions for pelvic pain  . ROOT CANAL  10/10/12  . TOOTH EXTRACTION      Outpatient Medications Prior to Visit  Medication Sig Dispense Refill  . amitriptyline (ELAVIL) 50 MG tablet Take 100 mg by mouth at bedtime as needed for sleep.     . calcium carbonate (OSCAL) 1500 (600 Ca) MG TABS tablet Take 1,200 mg of elemental calcium by mouth daily with breakfast.    . cholestyramine (QUESTRAN) 4 g packet Take 4 g by mouth 4 (four) times daily.    . cyclobenzaprine (FLEXERIL) 10 MG tablet Take 10 mg by mouth 2 (two) times daily.   0  . dicyclomine (BENTYL) 10 MG capsule Take 10 mg by mouth 3 (three) times daily as needed for spasms.     . feeding supplement, GLUCERNA SHAKE, (GLUCERNA SHAKE) LIQD Take 237 mLs by mouth 6 (six) times daily.    Marland Kitchen gabapentin (NEURONTIN) 300 MG capsule Take 300 mg by mouth 3 (three) times daily.    Marland Kitchen glucagon (GLUCAGON EMERGENCY) 1 MG injection Inject 1 mg into the vein once as needed (for severe hypoglycemia).    . Insulin Human (INSULIN PUMP) SOLN Pt uses Humalog -- 10 units once daily and 0-5 units with meals.    . Multiple Vitamin (MULTIVITAMIN WITH MINERALS) TABS Take 2 tablets by mouth daily.     Marland Kitchen  omeprazole (PRILOSEC) 40 MG capsule Take 40 mg by mouth daily.     Marland Kitchen oxyCODONE-acetaminophen (PERCOCET) 7.5-325 MG tablet Take 1 tablet by mouth every 4 (four) hours as needed for severe pain.   0  . Probiotic Product (DIGESTIVE ADVANTAGE GUMMIES PO) Take 2 each by mouth 2 (two) times daily.     . vitamin B-12 (CYANOCOBALAMIN) 1000 MCG tablet Take 1,000 mcg by mouth 2 (two) times daily.    . hydrOXYzine (ATARAX/VISTARIL) 25 MG tablet Take 25 mg by mouth 3 (three) times daily as needed for anxiety.    . promethazine (PHENERGAN) 25 MG suppository PLACE 1 SUPPOSITORY RECTALLY EVERY 6 (SIX)  HOURS AS NEEDED FOR NAUSEA OR VOMITING. 30 suppository 0  . traZODone (DESYREL) 50 MG tablet Take 50 mg by mouth at bedtime as needed for sleep.     Marland Kitchen acetaminophen (TYLENOL) 500 MG tablet Take 500 mg by mouth every 6 (six) hours as needed for mild pain, moderate pain or headache.     . ALPRAZolam (XANAX) 1 MG tablet Take 2 mg by mouth at bedtime as needed for sleep.     Marland Kitchen CREON 36000 UNITS CPEP capsule Take 144,000-216,000 Units by mouth 5 (five) times daily. Pt takes six capsules three times daily with meals and four capsules two times daily with snacks.  1  . hyoscyamine (LEVSIN SL) 0.125 MG SL tablet Place 0.125 mg under the tongue every 4 (four) hours as needed for cramping or pain.  0  . saccharomyces boulardii (FLORASTOR) 250 MG capsule Take 1 capsule (250 mg total) by mouth 2 (two) times daily. (Patient not taking: Reported on 05/10/2016) 60 capsule 2  . promethazine (PHENERGAN) 25 MG tablet Take 25 mg by mouth every 6 (six) hours as needed for nausea or vomiting.     No facility-administered medications prior to visit.     ROS Review of Systems General: negative for fever, positive for weight loss and appetite change Eyes: no visual symptoms. ENT: no ear symptoms, no sinus tenderness, no nasal congestion or sore throat, complains of toothache Neck: no pain  Respiratory: no wheezing, shortness of breath, cough Cardiovascular: no chest pain, no dyspnea on exertion, no pedal edema, no orthopnea. Gastrointestinal: Positive for chronic diarrhea Genito-Urinary: no urinary frequency, no dysuria, no polyuria. Hematologic: no bruising Endocrine: no cold or heat intolerance Neurological: no headaches, no seizures, no tremors Musculoskeletal: no joint pains, no joint swelling Skin: see hpi Psychological: no depression, positive for anxiety,  Objective:  BP 110/80 (BP Location: Right Arm, Patient Position: Sitting, Cuff Size: Small)   Pulse (!) 107   Temp 98.9 F (37.2 C) (Oral)   Ht 5'  5" (1.651 m)   Wt 93 lb 12.8 oz (42.5 kg)   SpO2 98%   BMI 15.61 kg/m   BP/Weight 05/10/2016 04/24/2016 96/75/9163  Systolic BP 846 98 659  Diastolic BP 80 68 78  Wt. (Lbs) 93.8 110 -  BMI 15.61 18.3 -      Physical Exam Constitutional: She is oriented to person, place, and time.  Thin appearing  Eyes: Slight discharge HENT:  Multiple absent teeth and associated dental caries  Cardiovascular: Normal heart sounds and intact distal pulses.  Tachycardia present.   No murmur heard. Pulmonary/Chest: Effort normal and breath sounds normal. She has no wheezes. She has no rales. She exhibits no tenderness.  Abdominal: Soft. Bowel sounds are normal. She exhibits no distension and no mass. There is tenderness (diffuse abdominal tenderness).  Musculoskeletal: Normal range  of motion.  Neurological: She is alert and oriented to person, place, and time.  Skin: Erythematous rash around the nose and between the nose and mouth  Lab Results  Component Value Date   HGBA1C 14.5 05/10/2016    Assessment & Plan:   1. Type 1 diabetes mellitus with ketoacidotic coma (HCC) Uncontrolled with A1c of 14.5 Hyperglycemia in the clinic-patient is resisting administration of insulin as she always does due to fear of hypoglycemia. Currently using the insulin pump and will administer 2 units. Patient encouraged to obtain sooner appointment with her endocrinologist as her current appointment is in 3 weeks. - Glucose (CBG) - HgB A1c  2. Impetigo Currently on Keflex We'll treat for superimposed bacterial infection of the eye given eye discharge - mupirocin ointment (BACTROBAN) 2 %; Place 1 application into the nose 2 (two) times daily.  Dispense: 30 g; Refill: 1 - ofloxacin (OCUFLOX) 0.3 % ophthalmic solution; Place 1 drop into both eyes 4 (four) times daily.  Dispense: 5 mL; Refill: 0  3. Nausea vomiting and diarrhea - promethazine (PHENERGAN) 25 MG suppository; PLACE 1 SUPPOSITORY RECTALLY EVERY EIGHT  HOURS AS NEEDED FOR NAUSEA OR VOMITING.  Dispense: 30 suppository; Refill: 2 - promethazine (PHENERGAN) 25 MG tablet; Take 1 tablet (25 mg total) by mouth every 8 (eight) hours as needed for nausea or vomiting.  Dispense: 30 tablet; Refill: 2  4. Protein-calorie malnutrition, severe ([place]) 1. To increase intake of proteins  5. Other insomnia - traZODone (DESYREL) 50 MG tablet; Take 1 tablet (50 mg total) by mouth at bedtime as needed for sleep.  Dispense: 30 tablet; Refill: 3  6. Anxiety state - hydrOXYzine (ATARAX/VISTARIL) 25 MG tablet; Take 1 tablet (25 mg total) by mouth 3 (three) times daily as needed for anxiety.  Dispense: 60 tablet; Refill: 3   Meds ordered this encounter  Medications  . traZODone (DESYREL) 50 MG tablet    Sig: Take 1 tablet (50 mg total) by mouth at bedtime as needed for sleep.    Dispense:  30 tablet    Refill:  3  . promethazine (PHENERGAN) 25 MG suppository    Sig: PLACE 1 SUPPOSITORY RECTALLY EVERY EIGHT HOURS AS NEEDED FOR NAUSEA OR VOMITING.    Dispense:  30 suppository    Refill:  2  . hydrOXYzine (ATARAX/VISTARIL) 25 MG tablet    Sig: Take 1 tablet (25 mg total) by mouth 3 (three) times daily as needed for anxiety.    Dispense:  60 tablet    Refill:  3  . promethazine (PHENERGAN) 25 MG tablet    Sig: Take 1 tablet (25 mg total) by mouth every 8 (eight) hours as needed for nausea or vomiting.    Dispense:  30 tablet    Refill:  2  . magic mouthwash w/lidocaine SOLN    Sig: Take 5 mLs by mouth 4 (four) times daily as needed for mouth pain.    Dispense:  120 mL    Refill:  0  . mupirocin ointment (BACTROBAN) 2 %    Sig: Place 1 application into the nose 2 (two) times daily.    Dispense:  30 g    Refill:  1  . ofloxacin (OCUFLOX) 0.3 % ophthalmic solution    Sig: Place 1 drop into both eyes 4 (four) times daily.    Dispense:  5 mL    Refill:  0    Follow-up: Return in about 1 month (around 06/10/2016) for Coordination of care.   [name]  Jarold Song  MD

## 2016-05-10 NOTE — Patient Instructions (Signed)
Impetigo, Adult Impetigo is an infection of the skin. It commonly occurs in young children, but it can also occur in adults. The infection causes itchy blisters and sores that produce brownish-yellow fluid. As the fluid dries, it forms a thick, honey-colored crust. These skin changes usually occur on the face but can also affect other areas of the body. Impetigo usually goes away in 7-10 days with treatment. What are the causes? Impetigo is caused by two types of bacteria. It may be caused by staphylococci or streptococci bacteria. These bacteria cause impetigo when they get under the surface of the skin. This often happens after some damage to the skin, such as damage from:  Cuts, scrapes, or scratches.  Insect bites, especially when you scratch the area of a bite.  Chickenpox or other illnesses that cause open skin sores.  Nail biting or chewing.  Impetigo is contagious and can spread easily from one person to another. This may occur through close skin contact or by sharing towels, clothing, or other items with a person who has the infection. What increases the risk? Some things that can increase the risk of getting this infection include:  Playing sports that include skin-to-skin contact with others.  Having a skin condition with open sores.  Having many skin cuts or scrapes.  Living in an area that has high humidity levels.  Having poor hygiene.  Having high levels of staphylococci in your nose.  What are the signs or symptoms? Impetigo usually starts out as small blisters, often on the face. The blisters then break open and turn into tiny sores (lesions) with a yellow crust. In some cases, the blisters cause itching or burning. With scratching, irritation, or lack of treatment, these small lesions may get larger. Scratching can also cause impetigo to spread to other parts of the body. The bacteria can get under the fingernails and spread when you touch another area of your  skin. Other possible symptoms include:  Larger blisters.  Pus.  Swollen lymph glands.  How is this diagnosed? This condition is usually diagnosed during a physical exam. A skin sample or sample of fluid from a blister may be taken for lab tests that involve growing bacteria (culture test). This can help confirm the diagnosis or help determine the best treatment. How is this treated? Mild impetigo can be treated with prescription antibiotic cream. Oral antibiotic medicine may be used in more severe cases. Medicines for itching may also be used. Follow these instructions at home:  Take medicines only as directed by your health care provider.  To help prevent impetigo from spreading to other body areas: ? Keep your fingernails short and clean. ? Do not scratch the blisters or sores. ? Cover infected areas, if necessary, to keep from scratching.  Gently wash the infected areas with antibiotic soap and water.  Soak crusted areas in warm, soapy water using antibiotic soap. ? Gently rub the areas to remove crusts. Do not scrub.  Wash your hands often to avoid spreading this infection.  Stay home until you have used an antibiotic cream for 48 hours (2 days) or an oral antibiotic medicine for 24 hours (1 day). You should only return to work and activities with other people if your skin shows significant improvement. How is this prevented? To keep the infection from spreading:  Stay home until you have used an antibiotic cream for 48 hours or an oral antibiotic for 24 hours.  Wash your hands often.  Do not engage in   skin-to-skin contact with other people while you have still have blisters.  Do not share towels, washcloths, or bedding with others while you have the infection.  Contact a health care provider if:  You develop more blisters or sores despite treatment.  Other family members get sores.  Your skin sores are not improving after 48 hours of treatment.  You have a  fever. Get help right away if:  You see spreading redness or swelling of the skin around your sores.  You see red streaks coming from your sores.  You develop a sore throat. This information is not intended to replace advice given to you by your health care provider. Make sure you discuss any questions you have with your health care provider. Document Released: 03/01/2014 Document Revised: 07/17/2015 Document Reviewed: 01/22/2014 Elsevier Interactive Patient Education  2017 [employer]  

## 2016-05-17 ENCOUNTER — Telehealth: Payer: Self-pay | Admitting: Family Medicine

## 2016-05-17 NOTE — Telephone Encounter (Signed)
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 Is inquiring as to if she can get a refill or of she needs to schedule a f/u appt. Please advise.

## 2016-05-18 MED ORDER — CEPHALEXIN 500 MG PO CAPS: 500.0000 mg | ORAL_CAPSULE | Freq: Two times a day (BID) | ORAL | 0 refills | Status: DC

## 2016-05-18 NOTE — Telephone Encounter (Signed)
I have sent in a refill for Keflex to her pharmacy.

## 2016-05-19 NOTE — Telephone Encounter (Signed)
Writer called patient per Dr. Jarold Song and LVM that her keflex has been sent to the pharmacy.  Patient encouraged to call with any questions or concerns.

## 2016-05-24 ENCOUNTER — Telehealth: Payer: Self-pay | Admitting: Family Medicine

## 2016-05-24 NOTE — Telephone Encounter (Signed)
Patient called the office asking to speak with PCP regarding the eye drops that were medication to her. They aren't working. Pt still has the infection in her eye. Pt needs something stronger. Like steroid eye drops. Please follow up.  Thank you.

## 2016-05-27 NOTE — Telephone Encounter (Signed)
Patient calling stating she is on the second round of antibiotics for the rash on her face but it has not improved. Patient states is has now moved into her eye which is creating puss. Patient states she was prescribed antibiotic eye drops but they are not working. Patient wanted to see if a steroid based drop or ointment could be called in.  Patient states she can barely open her eyes so she is unable to drive at the moment.  Please follow up with patient

## 2016-05-27 NOTE — Telephone Encounter (Signed)
Writer called patient who is requesting steroid eye drops.  Writer has sent a detailed note to Dr. Adrian Blackwater and is waiting for a reply.

## 2016-06-09 ENCOUNTER — Ambulatory Visit: Payer: Self-pay | Admitting: Family Medicine

## 2016-06-24 ENCOUNTER — Telehealth: Payer: Self-pay | Admitting: Family Medicine

## 2016-06-24 NOTE — Telephone Encounter (Signed)
Pt calling to request a refill of the med she was given for empetigo. States that she is out of med and she still has the empetigo, it is in her eyes so she can't drive. Please advise if a refill will be given. Thank you.

## 2016-06-24 NOTE — Telephone Encounter (Signed)
Patient requesting a refill on impetigo medication.

## 2016-06-25 MED ORDER — CEPHALEXIN 500 MG PO CAPS: 500.0000 mg | ORAL_CAPSULE | Freq: Two times a day (BID) | ORAL | 0 refills | Status: AC

## 2016-06-25 NOTE — Telephone Encounter (Signed)
Refilled

## 2016-06-28 ENCOUNTER — Telehealth: Payer: Self-pay | Admitting: Family Medicine

## 2016-06-28 NOTE — Telephone Encounter (Signed)
I sent this prescription on 06/25/16 to the pharmacy

## 2016-06-28 NOTE — Telephone Encounter (Signed)
Pt. Called requesting a refill on cephALEXin (KEFLEX) 500 MG capsule Pt. States that her face is itchy and has a rash. Pt. States that she is desperate.  Pt. Would like Rx sent to [facility] in [place], [place]. Please f/u

## 2016-07-14 ENCOUNTER — Other Ambulatory Visit
Admission: RE | Admit: 2016-07-14 | Discharge: 2016-07-14 | Disposition: A | Discharge status: Home / Self Care | Payer: Medicaid Other | Source: Ambulatory Visit | Attending: Dermatology | Admitting: Dermatology

## 2016-07-14 DIAGNOSIS — B379 Candidiasis, unspecified: Secondary | ICD-10-CM | POA: Insufficient documentation

## 2016-07-14 LAB — CBC
HEMATOCRIT: 38 % (ref 35.0–47.0)
Hemoglobin: 12.6 g/dL (ref 12.0–16.0)
MCH: 34 pg (ref 26.0–34.0)
MCHC: 33.1 g/dL (ref 32.0–36.0)
MCV: 102.6 fL — ABNORMAL HIGH (ref 80.0–100.0)
Platelets: 180 10*3/uL (ref 150–440)
RBC: 3.7 MIL/uL — AB (ref 3.80–5.20)
RDW: 19.8 % — ABNORMAL HIGH (ref 11.5–14.5)
WBC: 3.6 10*3/uL (ref 3.6–11.0)

## 2016-07-14 LAB — HEPATIC FUNCTION PANEL
ALT: 48 U/L (ref 14–54)
AST: 137 U/L — ABNORMAL HIGH (ref 15–41)
Albumin: 4.4 g/dL (ref 3.5–5.0)
Alkaline Phosphatase: 214 U/L — ABNORMAL HIGH (ref 38–126)
BILIRUBIN TOTAL: 0.6 mg/dL (ref 0.3–1.2)
Total Protein: 8.1 g/dL (ref 6.5–8.1)

## 2016-07-14 LAB — BASIC METABOLIC PANEL
Anion gap: 14 (ref 5–15)
CALCIUM: 9.1 mg/dL (ref 8.9–10.3)
CO2: 26 mmol/L (ref 22–32)
CREATININE: 0.81 mg/dL (ref 0.44–1.00)
Chloride: 90 mmol/L — ABNORMAL LOW (ref 101–111)
GFR calc Af Amer: >60 mL/min (ref >60)
GLUCOSE: 453 mg/dL — AB (ref 65–99)
Potassium: 3.4 mmol/L — ABNORMAL LOW (ref 3.5–5.1)
Sodium: 130 mmol/L — ABNORMAL LOW (ref 135–145)

## 2016-07-15 LAB — VITAMIN D 25 HYDROXY (VIT D DEFICIENCY, FRACTURES): VIT D 25 HYDROXY: 14.4 ng/mL — AB (ref 30.0–100.0)

## 2016-07-15 LAB — MISC LABCORP TEST (SEND OUT)
LABCORP TEST CODE: 209601
LabCorp test name: 4

## 2016-11-28 ENCOUNTER — Other Ambulatory Visit: Payer: Self-pay | Admitting: Family Medicine

## 2016-11-28 DIAGNOSIS — G4709 Other insomnia: Secondary | ICD-10-CM

## 2016-12-08 ENCOUNTER — Other Ambulatory Visit: Payer: Self-pay | Admitting: Family Medicine

## 2016-12-08 DIAGNOSIS — G4709 Other insomnia: Secondary | ICD-10-CM

## 2017-01-04 ENCOUNTER — Other Ambulatory Visit: Payer: Self-pay | Admitting: Family Medicine

## 2017-01-04 DIAGNOSIS — G4709 Other insomnia: Secondary | ICD-10-CM

## 2017-02-02 ENCOUNTER — Other Ambulatory Visit: Payer: Self-pay | Admitting: Family Medicine

## 2017-02-02 DIAGNOSIS — G4709 Other insomnia: Secondary | ICD-10-CM

## 2017-02-17 ENCOUNTER — Other Ambulatory Visit: Payer: Self-pay | Admitting: Family Medicine

## 2017-02-17 DIAGNOSIS — F411 Generalized anxiety disorder: Secondary | ICD-10-CM

## 2017-02-24 ENCOUNTER — Other Ambulatory Visit: Payer: Self-pay | Admitting: Family Medicine

## 2017-02-24 DIAGNOSIS — F411 Generalized anxiety disorder: Secondary | ICD-10-CM

## 2017-02-24 DIAGNOSIS — G4709 Other insomnia: Secondary | ICD-10-CM

## 2017-03-11 ENCOUNTER — Other Ambulatory Visit: Payer: Self-pay | Admitting: Gastroenterology

## 2017-03-11 DIAGNOSIS — R197 Diarrhea, unspecified: Secondary | ICD-10-CM

## 2017-03-11 DIAGNOSIS — R112 Nausea with vomiting, unspecified: Secondary | ICD-10-CM

## 2017-04-06 ENCOUNTER — Ambulatory Visit: Payer: Medicaid Other

## 2017-04-27 ENCOUNTER — Ambulatory Visit: Payer: Medicaid Other

## 2017-05-04 ENCOUNTER — Ambulatory Visit
Admission: RE | Admit: 2017-05-04 | Discharge: 2017-05-04 | Disposition: A | Discharge status: Home / Self Care | Payer: Medicaid Other | Source: Ambulatory Visit | Attending: Gastroenterology | Admitting: Gastroenterology

## 2017-05-04 DIAGNOSIS — R112 Nausea with vomiting, unspecified: Secondary | ICD-10-CM | POA: Insufficient documentation

## 2017-05-04 DIAGNOSIS — R197 Diarrhea, unspecified: Secondary | ICD-10-CM | POA: Diagnosis not present

## 2017-05-04 DIAGNOSIS — R160 Hepatomegaly, not elsewhere classified: Secondary | ICD-10-CM | POA: Insufficient documentation

## 2017-05-04 LAB — POCT I-STAT CREATININE: Creatinine, Ser: 0.6 mg/dL (ref 0.44–1.00)

## 2017-05-04 MED ORDER — IOPAMIDOL (ISOVUE-300) INJECTION 61%
75.0000 mL | Freq: Once | INTRAVENOUS | Status: AC | PRN
  Administered 2017-05-04: 75 mL via INTRAVENOUS

## 2019-05-24 DEATH — deceased
